# Patient Record
Sex: Male | Born: 1955 | Hispanic: Yes | Marital: Married | Smoking: Former smoker
Health system: Southern US, Community
[De-identification: ages and names within clinical notes are randomized; demographics above are authoritative.]

## PROBLEM LIST (undated history)

## (undated) ENCOUNTER — Telehealth

## (undated) ENCOUNTER — Encounter

## (undated) ENCOUNTER — Encounter: Attending: Nephrology | Primary: Nephrology

## (undated) ENCOUNTER — Ambulatory Visit

## (undated) ENCOUNTER — Ambulatory Visit: Payer: MEDICARE

## (undated) ENCOUNTER — Encounter: Payer: MEDICARE | Attending: Nephrology | Primary: Nephrology

## (undated) ENCOUNTER — Ambulatory Visit: Payer: Medicare (Managed Care) | Attending: Nephrology | Primary: Nephrology

## (undated) ENCOUNTER — Inpatient Hospital Stay

## (undated) ENCOUNTER — Ambulatory Visit: Attending: Family Medicine | Primary: Family Medicine

## (undated) DIAGNOSIS — E119 Type 2 diabetes mellitus without complications: Secondary | ICD-10-CM

## (undated) DIAGNOSIS — Z7982 Long term (current) use of aspirin: Secondary | ICD-10-CM

## (undated) DIAGNOSIS — N186 End stage renal disease: Secondary | ICD-10-CM

## (undated) DIAGNOSIS — M1990 Unspecified osteoarthritis, unspecified site: Secondary | ICD-10-CM

## (undated) DIAGNOSIS — Z79899 Other long term (current) drug therapy: Secondary | ICD-10-CM

## (undated) DIAGNOSIS — Z7960 Long term (current) use of unspecified immunomodulators and immunosuppressants: Secondary | ICD-10-CM

## (undated) DIAGNOSIS — J189 Pneumonia, unspecified organism: Secondary | ICD-10-CM

## (undated) DIAGNOSIS — N400 Enlarged prostate without lower urinary tract symptoms: Secondary | ICD-10-CM

## (undated) DIAGNOSIS — K219 Gastro-esophageal reflux disease without esophagitis: Secondary | ICD-10-CM

## (undated) DIAGNOSIS — G4730 Sleep apnea, unspecified: Secondary | ICD-10-CM

## (undated) DIAGNOSIS — I219 Acute myocardial infarction, unspecified: Secondary | ICD-10-CM

## (undated) DIAGNOSIS — R0600 Dyspnea, unspecified: Secondary | ICD-10-CM

## (undated) DIAGNOSIS — I200 Unstable angina: Secondary | ICD-10-CM

## (undated) DIAGNOSIS — Z9289 Personal history of other medical treatment: Secondary | ICD-10-CM

## (undated) DIAGNOSIS — I2510 Atherosclerotic heart disease of native coronary artery without angina pectoris: Secondary | ICD-10-CM

## (undated) DIAGNOSIS — M51369 Other intervertebral disc degeneration, lumbar region without mention of lumbar back pain or lower extremity pain: Secondary | ICD-10-CM

## (undated) DIAGNOSIS — I480 Paroxysmal atrial fibrillation: Secondary | ICD-10-CM

## (undated) DIAGNOSIS — I10 Essential (primary) hypertension: Secondary | ICD-10-CM

## (undated) DIAGNOSIS — I779 Disorder of arteries and arterioles, unspecified: Secondary | ICD-10-CM

## (undated) DIAGNOSIS — Z7901 Long term (current) use of anticoagulants: Secondary | ICD-10-CM

## (undated) DIAGNOSIS — E559 Vitamin D deficiency, unspecified: Secondary | ICD-10-CM

## (undated) DIAGNOSIS — N189 Chronic kidney disease, unspecified: Secondary | ICD-10-CM

## (undated) DIAGNOSIS — I700 Atherosclerosis of aorta: Secondary | ICD-10-CM

## (undated) HISTORY — PX: HIP SURGERY: SHX245

## (undated) HISTORY — PX: CATARACT EXTRACTION: SUR2

## (undated) HISTORY — PX: EYE SURGERY: SHX253

## (undated) HISTORY — PX: HIP FRACTURE SURGERY: SHX118

## (undated) HISTORY — PX: CORONARY ANGIOPLASTY: SHX604

---
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
## 2001-10-03 ENCOUNTER — Inpatient Hospital Stay (HOSPITAL_COMMUNITY): Admission: AD | Admit: 2001-10-03 | Discharge: 2001-10-11 | Payer: Self-pay | Admitting: Cardiology

## 2001-10-06 ENCOUNTER — Encounter: Payer: Self-pay | Admitting: Cardiology

## 2001-10-09 ENCOUNTER — Encounter: Payer: Self-pay | Admitting: Cardiology

## 2002-07-16 ENCOUNTER — Encounter: Payer: Self-pay | Admitting: Emergency Medicine

## 2002-07-16 ENCOUNTER — Emergency Department (HOSPITAL_COMMUNITY): Admission: EM | Admit: 2002-07-16 | Discharge: 2002-07-16 | Payer: Self-pay | Admitting: Emergency Medicine

## 2002-08-02 DIAGNOSIS — I219 Acute myocardial infarction, unspecified: Secondary | ICD-10-CM

## 2002-08-02 HISTORY — PX: CORONARY ANGIOPLASTY WITH STENT PLACEMENT: SHX49

## 2002-08-02 HISTORY — DX: Acute myocardial infarction, unspecified: I21.9

## 2003-08-03 HISTORY — PX: CORONARY ANGIOPLASTY WITH STENT PLACEMENT: SHX49

## 2005-09-14 ENCOUNTER — Ambulatory Visit: Payer: Self-pay | Admitting: Nurse Practitioner

## 2005-11-18 ENCOUNTER — Ambulatory Visit: Payer: Self-pay | Admitting: Podiatry

## 2006-05-20 ENCOUNTER — Ambulatory Visit: Payer: Self-pay | Admitting: Cardiovascular Disease

## 2006-05-20 HISTORY — PX: LEFT HEART CATH AND CORONARY ANGIOGRAPHY: CATH118249

## 2006-06-08 HISTORY — PX: CORONARY ARTERY BYPASS GRAFT: SHX141

## 2007-08-10 ENCOUNTER — Ambulatory Visit: Payer: Self-pay | Admitting: Cardiovascular Disease

## 2007-08-10 HISTORY — PX: LEFT HEART CATH AND CORS/GRAFTS ANGIOGRAPHY: CATH118250

## 2007-08-25 ENCOUNTER — Inpatient Hospital Stay: Payer: Self-pay | Admitting: Cardiovascular Disease

## 2007-08-25 ENCOUNTER — Other Ambulatory Visit: Payer: Self-pay

## 2007-08-25 HISTORY — PX: CORONARY ANGIOPLASTY WITH STENT PLACEMENT: SHX49

## 2007-11-13 ENCOUNTER — Ambulatory Visit: Payer: Self-pay | Admitting: Family Medicine

## 2008-10-02 ENCOUNTER — Ambulatory Visit: Payer: Self-pay | Admitting: Family Medicine

## 2010-02-24 ENCOUNTER — Ambulatory Visit: Payer: Self-pay | Admitting: Family Medicine

## 2010-04-13 ENCOUNTER — Emergency Department: Payer: Self-pay | Admitting: Emergency Medicine

## 2010-07-13 ENCOUNTER — Ambulatory Visit: Payer: Self-pay | Admitting: Vascular Surgery

## 2010-08-03 ENCOUNTER — Inpatient Hospital Stay: Payer: Self-pay | Admitting: Internal Medicine

## 2010-08-26 ENCOUNTER — Ambulatory Visit (HOSPITAL_COMMUNITY)
Admission: RE | Admit: 2010-08-26 | Discharge: 2010-08-26 | Payer: Self-pay | Source: Home / Self Care | Admitting: Internal Medicine

## 2010-09-01 ENCOUNTER — Other Ambulatory Visit: Payer: Self-pay | Admitting: Internal Medicine

## 2010-09-01 DIAGNOSIS — N186 End stage renal disease: Secondary | ICD-10-CM

## 2010-09-02 ENCOUNTER — Other Ambulatory Visit (HOSPITAL_COMMUNITY): Payer: Self-pay

## 2010-10-19 ENCOUNTER — Emergency Department (HOSPITAL_COMMUNITY): Payer: PRIVATE HEALTH INSURANCE

## 2010-10-19 ENCOUNTER — Inpatient Hospital Stay (HOSPITAL_COMMUNITY)
Admission: EM | Admit: 2010-10-19 | Discharge: 2010-10-23 | DRG: 480 | Disposition: A | Discharge status: Rehabilitation Facility | Payer: PRIVATE HEALTH INSURANCE | Attending: Internal Medicine | Admitting: Internal Medicine

## 2010-10-19 DIAGNOSIS — Z930 Tracheostomy status: Secondary | ICD-10-CM

## 2010-10-19 DIAGNOSIS — Y9210 Unspecified residential institution as the place of occurrence of the external cause: Secondary | ICD-10-CM | POA: Diagnosis present

## 2010-10-19 DIAGNOSIS — J95821 Acute postprocedural respiratory failure: Secondary | ICD-10-CM | POA: Diagnosis not present

## 2010-10-19 DIAGNOSIS — Y998 Other external cause status: Secondary | ICD-10-CM

## 2010-10-19 DIAGNOSIS — S52539A Colles' fracture of unspecified radius, initial encounter for closed fracture: Secondary | ICD-10-CM | POA: Diagnosis present

## 2010-10-19 DIAGNOSIS — D631 Anemia in chronic kidney disease: Secondary | ICD-10-CM | POA: Diagnosis present

## 2010-10-19 DIAGNOSIS — J90 Pleural effusion, not elsewhere classified: Secondary | ICD-10-CM | POA: Diagnosis present

## 2010-10-19 DIAGNOSIS — E46 Unspecified protein-calorie malnutrition: Secondary | ICD-10-CM | POA: Diagnosis present

## 2010-10-19 DIAGNOSIS — E785 Hyperlipidemia, unspecified: Secondary | ICD-10-CM | POA: Diagnosis present

## 2010-10-19 DIAGNOSIS — S72143A Displaced intertrochanteric fracture of unspecified femur, initial encounter for closed fracture: Principal | ICD-10-CM | POA: Diagnosis present

## 2010-10-19 DIAGNOSIS — Z992 Dependence on renal dialysis: Secondary | ICD-10-CM

## 2010-10-19 DIAGNOSIS — E119 Type 2 diabetes mellitus without complications: Secondary | ICD-10-CM | POA: Diagnosis present

## 2010-10-19 DIAGNOSIS — I82729 Chronic embolism and thrombosis of deep veins of unspecified upper extremity: Secondary | ICD-10-CM | POA: Diagnosis present

## 2010-10-19 DIAGNOSIS — D649 Anemia, unspecified: Secondary | ICD-10-CM | POA: Diagnosis present

## 2010-10-19 DIAGNOSIS — W06XXXA Fall from bed, initial encounter: Secondary | ICD-10-CM | POA: Diagnosis present

## 2010-10-19 DIAGNOSIS — I120 Hypertensive chronic kidney disease with stage 5 chronic kidney disease or end stage renal disease: Secondary | ICD-10-CM | POA: Diagnosis present

## 2010-10-19 DIAGNOSIS — N186 End stage renal disease: Secondary | ICD-10-CM | POA: Diagnosis present

## 2010-10-19 DIAGNOSIS — I2510 Atherosclerotic heart disease of native coronary artery without angina pectoris: Secondary | ICD-10-CM | POA: Diagnosis present

## 2010-10-19 LAB — POCT I-STAT 3, ART BLOOD GAS (G3+)
pCO2 arterial: 36.3 mmHg (ref 35.0–45.0)
pO2, Arterial: 72 mmHg — ABNORMAL LOW (ref 80.0–100.0)

## 2010-10-19 LAB — BASIC METABOLIC PANEL
CO2: 27 mEq/L (ref 19–32)
Chloride: 97 mEq/L (ref 96–112)
GFR calc Af Amer: 19 mL/min — ABNORMAL LOW (ref >60)
Potassium: 4.4 mEq/L (ref 3.5–5.1)
Sodium: 132 mEq/L — ABNORMAL LOW (ref 135–145)

## 2010-10-19 LAB — DIFFERENTIAL
Eosinophils Absolute: 0.1 10*3/uL (ref 0.0–0.7)
Lymphs Abs: 1.8 10*3/uL (ref 0.7–4.0)
Monocytes Relative: 12 % (ref 3–12)
Neutrophils Relative %: 73 % (ref 43–77)

## 2010-10-19 LAB — CROSSMATCH: Antibody Screen: NEGATIVE

## 2010-10-19 LAB — URINE MICROSCOPIC-ADD ON

## 2010-10-19 LAB — URINALYSIS, ROUTINE W REFLEX MICROSCOPIC
Bilirubin Urine: NEGATIVE
Nitrite: NEGATIVE
Specific Gravity, Urine: 1.015 (ref 1.005–1.030)
Urobilinogen, UA: 0.2 mg/dL (ref 0.0–1.0)

## 2010-10-19 LAB — CBC
MCH: 27.2 pg (ref 26.0–34.0)
MCV: 86.5 fL (ref 78.0–100.0)
Platelets: 471 10*3/uL — ABNORMAL HIGH (ref 150–400)
RBC: 3.86 MIL/uL — ABNORMAL LOW (ref 4.22–5.81)

## 2010-10-19 LAB — ABO/RH: ABO/RH(D): A POS

## 2010-10-20 ENCOUNTER — Inpatient Hospital Stay (HOSPITAL_COMMUNITY): Payer: PRIVATE HEALTH INSURANCE

## 2010-10-20 DIAGNOSIS — S72009A Fracture of unspecified part of neck of unspecified femur, initial encounter for closed fracture: Secondary | ICD-10-CM

## 2010-10-20 DIAGNOSIS — W19XXXA Unspecified fall, initial encounter: Secondary | ICD-10-CM

## 2010-10-20 DIAGNOSIS — J9600 Acute respiratory failure, unspecified whether with hypoxia or hypercapnia: Secondary | ICD-10-CM

## 2010-10-20 LAB — PHOSPHORUS: Phosphorus: 4.2 mg/dL (ref 2.3–4.6)

## 2010-10-20 LAB — GLUCOSE, CAPILLARY
Glucose-Capillary: 141 mg/dL — ABNORMAL HIGH (ref 70–99)
Glucose-Capillary: 127 mg/dL — ABNORMAL HIGH (ref 70–99)
Glucose-Capillary: 127 mg/dL — ABNORMAL HIGH (ref 70–99)

## 2010-10-20 LAB — PREPARE FRESH FROZEN PLASMA

## 2010-10-20 LAB — POCT I-STAT 3, ART BLOOD GAS (G3+)
Acid-Base Excess: 4 mmol/L — ABNORMAL HIGH (ref 0.0–2.0)
Patient temperature: 98.9
TCO2: 29 mmol/L (ref 0–100)
pH, Arterial: 7.451 — ABNORMAL HIGH (ref 7.350–7.450)

## 2010-10-20 LAB — BASIC METABOLIC PANEL
CO2: 27 mEq/L (ref 19–32)
Calcium: 7.7 mg/dL — ABNORMAL LOW (ref 8.4–10.5)
Creatinine, Ser: 3.83 mg/dL — ABNORMAL HIGH (ref 0.4–1.5)
GFR calc non Af Amer: 16 mL/min — ABNORMAL LOW (ref >60)
Glucose, Bld: 149 mg/dL — ABNORMAL HIGH (ref 70–99)
Sodium: 136 mEq/L (ref 135–145)

## 2010-10-20 LAB — HEPATITIS B SURFACE ANTIGEN: Hepatitis B Surface Ag: NEGATIVE

## 2010-10-20 LAB — CBC
HCT: 25 % — ABNORMAL LOW (ref 39.0–52.0)
MCH: 27.5 pg (ref 26.0–34.0)
MCHC: 32 g/dL (ref 30.0–36.0)
RDW: 15.8 % — ABNORMAL HIGH (ref 11.5–15.5)

## 2010-10-20 LAB — MRSA PCR SCREENING: MRSA by PCR: POSITIVE — AB

## 2010-10-20 LAB — URINE CULTURE: Colony Count: >=100000

## 2010-10-21 ENCOUNTER — Inpatient Hospital Stay (HOSPITAL_COMMUNITY): Payer: PRIVATE HEALTH INSURANCE

## 2010-10-21 DIAGNOSIS — S72143A Displaced intertrochanteric fracture of unspecified femur, initial encounter for closed fracture: Secondary | ICD-10-CM

## 2010-10-21 DIAGNOSIS — S52309A Unspecified fracture of shaft of unspecified radius, initial encounter for closed fracture: Secondary | ICD-10-CM

## 2010-10-21 LAB — GLUCOSE, CAPILLARY
Glucose-Capillary: 121 mg/dL — ABNORMAL HIGH (ref 70–99)
Glucose-Capillary: 140 mg/dL — ABNORMAL HIGH (ref 70–99)
Glucose-Capillary: 256 mg/dL — ABNORMAL HIGH (ref 70–99)

## 2010-10-21 LAB — COMPREHENSIVE METABOLIC PANEL
AST: 19 U/L (ref 0–37)
Albumin: 1.8 g/dL — ABNORMAL LOW (ref 3.5–5.2)
BUN: 13 mg/dL (ref 6–23)
Calcium: 7.9 mg/dL — ABNORMAL LOW (ref 8.4–10.5)
Creatinine, Ser: 2.26 mg/dL — ABNORMAL HIGH (ref 0.4–1.5)
GFR calc Af Amer: 37 mL/min — ABNORMAL LOW (ref >60)
Total Protein: 6.6 g/dL (ref 6.0–8.3)

## 2010-10-21 LAB — CBC
MCV: 86.4 fL (ref 78.0–100.0)
MCV: 86.9 fL (ref 78.0–100.0)
Platelets: 405 10*3/uL — ABNORMAL HIGH (ref 150–400)
Platelets: 454 10*3/uL — ABNORMAL HIGH (ref 150–400)
RBC: 3.16 MIL/uL — ABNORMAL LOW (ref 4.22–5.81)
RBC: 3.12 MIL/uL — ABNORMAL LOW (ref 4.22–5.81)
WBC: 13.2 10*3/uL — ABNORMAL HIGH (ref 4.0–10.5)
WBC: 12.2 10*3/uL — ABNORMAL HIGH (ref 4.0–10.5)

## 2010-10-21 LAB — PROTIME-INR
INR: 1.82 — ABNORMAL HIGH (ref 0.00–1.49)
Prothrombin Time: 21.2 seconds — ABNORMAL HIGH (ref 11.6–15.2)

## 2010-10-21 LAB — PTH, INTACT AND CALCIUM
Calcium, Total (PTH): 8 mg/dL — ABNORMAL LOW (ref 8.4–10.5)
PTH: 59.5 pg/mL (ref 14.0–72.0)

## 2010-10-21 LAB — APTT: aPTT: 51 seconds — ABNORMAL HIGH (ref 24–37)

## 2010-10-21 NOTE — Consult Note (Signed)
  NAMEROZELL, BEANER NO.:  [id number]  MEDICAL RECORD NO.:  [id number]           PATIENT TYPE:  I  LOCATION:  2104                         FACILITY:  [facility]  PHYSICIAN:  Pietro Cassis. Alvan Dame, M.D.  DATE OF BIRTH:  [birth year]  DATE OF CONSULTATION:  10/19/2010 DATE OF DISCHARGE:                                CONSULTATION   CHIEF COMPLAINT:  Left intertrochanteric femur fracture.  [name] is a 55 year old gentleman who was transferred from [facility] here in [place] after a fall.  The patient had been hospitalized for greater than a month at that facility with plans for his discharge to home apparently tomorrow.  Unfortunately, he had a fall and was transferred to [facility] revealed a left wrist fracture as well as the left hip fracture.  Orthopedics and Hand was consulted and he was admitted to the Medical Service.  PAST MEDICAL HISTORY:  Quite significant for anemia, coronary artery disease, deep vein thrombosis in the past on Coumadin, diabetes, end- stage renal disease, hyperlipidemia, hypertension, osteomyelitis of the spine, apparently pneumonia.  He has a history of PEG placement as well as tracheostomy.  SOCIAL HISTORY:  Apparently not a drug user, drinker, or smoker.  He does have family with him.  By time of my evaluation, he was already intubated in the OR.  Radiographs revealed an intertrochanteric femur fracture of left hip.  CURRENT MEDICATIONS:  Tylenol, aspirin, bisacodyl, citalopram, clonazepam, clonidine, dextrose, erythromycin, fentanyl patch, insulin, lactulose, metoprolol, morphine as needed, nitroglycerin, Zofran, pantoprazole, and MiraLax.  DRUG ALLERGIES:  No known drug allergies.  ASSESSMENT: 1. Left intertrochanteric femur fracture. 2. Left wrist fracture.  PLAN:  The patient's current medical and orthopedic conditions were reviewed in consultation over the phone.  The plan was to proceed to  OR to fix his left wrist.  Dr. Burney Gauze and nurses were able to obtain consent for open reduction and internal fixation of his left hip.  The patient was in the operating room at the time of my evaluation.  The appropriate side has been signed and a separate time-out was performed by the time I presented to the operating room.  The patient will have an open reduction and internal fixation of left hip.  He will then be partial weightbearing and will be treated medically.  We will follow him in the hospital and then in the postoperative course.     Pietro Cassis Alvan Dame, M.D.     MDO/MEDQ  D:  10/19/2010  T:  10/20/2010  Job:  [id number]  Electronically Signed by Paralee Cancel M.D. on 10/21/2010 07:21:52 PM

## 2010-10-21 NOTE — Op Note (Signed)
[name], VLACH NO.:  [id number]  MEDICAL RECORD NO.:  [id number]           PATIENT TYPE:  I  LOCATION:  2104                         FACILITY:  [facility]  PHYSICIAN:  Pietro Cassis. Alvan Dame, M.D.  DATE OF BIRTH:  1956/03/13  DATE OF PROCEDURE:  10/19/2010 DATE OF DISCHARGE:                              OPERATIVE REPORT   PREOPERATIVE DIAGNOSIS:  Left intertrochanteric femur fracture.  POSTOPERATIVE DIAGNOSIS:  Left intertrochanteric femur fracture.  PROCEDURE:  Open reduction and internal fixation of the left intertrochanteric femur fracture utilizing a DePuy troch nail 11 x 180 mm with a lag screw in the distal interlock.  SURGEON:  Pietro Cassis. Alvan Dame, MD.  ASSISTANT:  Surgical team.  ANESTHESIA:  General as the patient was already extubated.  This is a two-part procedure.  The left wrist was fixed by Dr. Charlotte Crumb.  SPECIMENS:  None.  COMPLICATIONS:  None.  DRAINS:  None.  INDICATIONS FOR PROCEDURE:  Mr. Nelli is a 55 year old gentleman with an extensive medical history with a recent hospitalization stay at [facility].  He was apparently getting ready to be discharged after a prolonged stay, requiring tracheostomy and PEG tube placement. He had a fall at that facility, landing on his left hip and wrist.  He was transferred to the [facility] revealed an intertrochanteric femur fracture as well as a wrist fracture. Orthopedics and Hand were consulted for management purposes.  After being admitted to the Medical Service, we were consulted for management.  After reviewing with Dr. Burney Gauze, the consulting Hand Surgeon, the plan was to do this at the same time and get it taken care of.  The patient was seen and evaluated radiographically, determined plan.  His consent was obtained with the plan with family.  He did have an elevated INR, received 2 units of FFP in the operating room prior to me addressing his left  hip.  PROCEDURE IN DETAIL:  The patient was already in the operating room on the fracture table.  He had had his wrist fixed.  He was subsequently positioned carefully with bony prominences and padded on the fracture table with the left foot in traction boot.  The right leg was flexed and abducted out of the way.  Traction and internal rotation was applied and the fracture reduced into a near anatomic position.  At this point, a time-out was performed, identifying the entrance of myself into the procedure as well as the planned procedure, and extremity.  The left lower extremity was then prepped and draped in sterile fashion using a shower curtain technique.  Landmarks were identified and an incision made at proximal trochanter.  Sharp dissection was carried through into the gluteal fascia.  The guidewire was then inserted into the tip of the trochanter and into the proximal femur.  The proximal femur was then opened with a drill and then a 11 x 180 mm nail passed by hand on the jig.  With the nail in its appropriate depth within the femur, the lag screw guide was then placed into the jig and then through an incision laterally onto the lateral cortex of  the femur.  Guidewire was then passed in the center of the head in AP and lateral planes.  At this point, I measured the depth, chose a 115-mm lag screw, drilled, and then placed the screw.  Once the screw was at its appropriate depth, I did release traction and applied some compression using the compression wheel.  There was some medialization of the shaft of the fracture segment.  At this point, a distal interlock was placed.  It did not penetrate the whole cortices and there was found to have excellent purchase within the medial cortex.  I felt this was adequate 36-mm screw and did not exchange it.  At this point, the jig was removed.  I did tighten down the screw in the proximal aspect of screw and backed it off one  quarter, return to allow for some further compression with weightbearing.  The jig was removed.  The wound was irrigated.  The proximal wound was closed in layers of #1 Vicryl and then 2-0 Vicryl.  The remaining wounds were closed with 2-0 Vicryl and staples on the skin.  The skin was cleaned, dried, and dressed sterilely using Mepilex dressing.  He was then transferred, remained intubated, per the plan with the medicine folks to the intensive care unit for observation overnight based on his medical condition.     Pietro Cassis Alvan Dame, M.D.     MDO/MEDQ  D:  10/19/2010  T:  10/20/2010  Job:  [id number]  Electronically Signed by Paralee Cancel M.D. on 10/21/2010 07:21:47 PM

## 2010-10-22 ENCOUNTER — Inpatient Hospital Stay (HOSPITAL_COMMUNITY): Payer: PRIVATE HEALTH INSURANCE

## 2010-10-22 DIAGNOSIS — J90 Pleural effusion, not elsewhere classified: Secondary | ICD-10-CM

## 2010-10-22 LAB — URINE MICROSCOPIC-ADD ON

## 2010-10-22 LAB — IRON AND TIBC: Saturation Ratios: 15 % — ABNORMAL LOW (ref 20–55)

## 2010-10-22 LAB — CBC
Hemoglobin: 9 g/dL — ABNORMAL LOW (ref 13.0–17.0)
MCHC: 31.9 g/dL (ref 30.0–36.0)
Platelets: 496 10*3/uL — ABNORMAL HIGH (ref 150–400)

## 2010-10-22 LAB — RENAL FUNCTION PANEL
Albumin: 1.7 g/dL — ABNORMAL LOW (ref 3.5–5.2)
CO2: 24 mEq/L (ref 19–32)
Calcium: 7.7 mg/dL — ABNORMAL LOW (ref 8.4–10.5)
GFR calc Af Amer: 21 mL/min — ABNORMAL LOW (ref >60)
GFR calc non Af Amer: 18 mL/min — ABNORMAL LOW (ref >60)
Phosphorus: 5.2 mg/dL — ABNORMAL HIGH (ref 2.3–4.6)
Sodium: 134 mEq/L — ABNORMAL LOW (ref 135–145)

## 2010-10-22 LAB — GLUCOSE, CAPILLARY
Glucose-Capillary: 185 mg/dL — ABNORMAL HIGH (ref 70–99)
Glucose-Capillary: 164 mg/dL — ABNORMAL HIGH (ref 70–99)
Glucose-Capillary: 237 mg/dL — ABNORMAL HIGH (ref 70–99)

## 2010-10-22 LAB — URINALYSIS, ROUTINE W REFLEX MICROSCOPIC
Glucose, UA: 100 mg/dL — AB
Specific Gravity, Urine: 1.021 (ref 1.005–1.030)
pH: 5.5 (ref 5.0–8.0)

## 2010-10-22 LAB — PROTIME-INR
INR: 2.31 — ABNORMAL HIGH (ref 0.00–1.49)
Prothrombin Time: 25.5 seconds — ABNORMAL HIGH (ref 11.6–15.2)

## 2010-10-23 ENCOUNTER — Inpatient Hospital Stay (HOSPITAL_COMMUNITY)
Admission: AD | Admit: 2010-10-23 | Discharge: 2010-11-05 | DRG: 945 | Disposition: A | Discharge status: Other Acute Inpatient Hospital | Payer: PRIVATE HEALTH INSURANCE | Source: Ambulatory Visit | Attending: Physical Medicine & Rehabilitation | Admitting: Physical Medicine & Rehabilitation

## 2010-10-23 ENCOUNTER — Inpatient Hospital Stay (HOSPITAL_COMMUNITY): Payer: PRIVATE HEALTH INSURANCE

## 2010-10-23 DIAGNOSIS — Z5189 Encounter for other specified aftercare: Principal | ICD-10-CM

## 2010-10-23 DIAGNOSIS — M519 Unspecified thoracic, thoracolumbar and lumbosacral intervertebral disc disorder: Secondary | ICD-10-CM | POA: Diagnosis present

## 2010-10-23 DIAGNOSIS — Z4789 Encounter for other orthopedic aftercare: Secondary | ICD-10-CM

## 2010-10-23 DIAGNOSIS — S52609A Unspecified fracture of lower end of unspecified ulna, initial encounter for closed fracture: Secondary | ICD-10-CM

## 2010-10-23 DIAGNOSIS — S72009A Fracture of unspecified part of neck of unspecified femur, initial encounter for closed fracture: Secondary | ICD-10-CM

## 2010-10-23 DIAGNOSIS — N186 End stage renal disease: Secondary | ICD-10-CM

## 2010-10-23 DIAGNOSIS — S52539A Colles' fracture of unspecified radius, initial encounter for closed fracture: Secondary | ICD-10-CM | POA: Diagnosis present

## 2010-10-23 DIAGNOSIS — Z992 Dependence on renal dialysis: Secondary | ICD-10-CM

## 2010-10-23 DIAGNOSIS — I82629 Acute embolism and thrombosis of deep veins of unspecified upper extremity: Secondary | ICD-10-CM | POA: Diagnosis present

## 2010-10-23 DIAGNOSIS — J90 Pleural effusion, not elsewhere classified: Secondary | ICD-10-CM | POA: Diagnosis not present

## 2010-10-23 DIAGNOSIS — E119 Type 2 diabetes mellitus without complications: Secondary | ICD-10-CM | POA: Diagnosis present

## 2010-10-23 DIAGNOSIS — S52509A Unspecified fracture of the lower end of unspecified radius, initial encounter for closed fracture: Secondary | ICD-10-CM

## 2010-10-23 DIAGNOSIS — M869 Osteomyelitis, unspecified: Secondary | ICD-10-CM | POA: Diagnosis present

## 2010-10-23 DIAGNOSIS — I2510 Atherosclerotic heart disease of native coronary artery without angina pectoris: Secondary | ICD-10-CM | POA: Diagnosis present

## 2010-10-23 DIAGNOSIS — S72143A Displaced intertrochanteric fracture of unspecified femur, initial encounter for closed fracture: Secondary | ICD-10-CM | POA: Diagnosis present

## 2010-10-23 DIAGNOSIS — Z951 Presence of aortocoronary bypass graft: Secondary | ICD-10-CM

## 2010-10-23 LAB — GLUCOSE, CAPILLARY
Glucose-Capillary: 168 mg/dL — ABNORMAL HIGH (ref 70–99)
Glucose-Capillary: 188 mg/dL — ABNORMAL HIGH (ref 70–99)
Glucose-Capillary: 326 mg/dL — ABNORMAL HIGH (ref 70–99)

## 2010-10-23 LAB — BASIC METABOLIC PANEL
BUN: 22 mg/dL (ref 6–23)
CO2: 25 mEq/L (ref 19–32)
Calcium: 8 mg/dL — ABNORMAL LOW (ref 8.4–10.5)
Chloride: 96 mEq/L (ref 96–112)
Creatinine, Ser: 2.76 mg/dL — ABNORMAL HIGH (ref 0.4–1.5)
GFR calc Af Amer: 29 mL/min — ABNORMAL LOW (ref >60)

## 2010-10-23 LAB — CBC
HCT: 29.8 % — ABNORMAL LOW (ref 39.0–52.0)
Hemoglobin: 9.4 g/dL — ABNORMAL LOW (ref 13.0–17.0)
RDW: 15.5 % (ref 11.5–15.5)
WBC: 16.4 10*3/uL — ABNORMAL HIGH (ref 4.0–10.5)

## 2010-10-23 LAB — DIFFERENTIAL
Basophils Absolute: 0 10*3/uL (ref 0.0–0.1)
Basophils Relative: 0 % (ref 0–1)
Lymphocytes Relative: 9 % — ABNORMAL LOW (ref 12–46)
Neutro Abs: 12.8 10*3/uL — ABNORMAL HIGH (ref 1.7–7.7)

## 2010-10-23 LAB — PROTIME-INR
INR: 2.51 — ABNORMAL HIGH (ref 0.00–1.49)
Prothrombin Time: 27.2 seconds — ABNORMAL HIGH (ref 11.6–15.2)

## 2010-10-23 LAB — PHOSPHORUS: Phosphorus: 4.1 mg/dL (ref 2.3–4.6)

## 2010-10-24 ENCOUNTER — Inpatient Hospital Stay (HOSPITAL_COMMUNITY): Payer: PRIVATE HEALTH INSURANCE

## 2010-10-24 DIAGNOSIS — M519 Unspecified thoracic, thoracolumbar and lumbosacral intervertebral disc disorder: Secondary | ICD-10-CM

## 2010-10-24 DIAGNOSIS — S52609A Unspecified fracture of lower end of unspecified ulna, initial encounter for closed fracture: Secondary | ICD-10-CM

## 2010-10-24 DIAGNOSIS — S72009A Fracture of unspecified part of neck of unspecified femur, initial encounter for closed fracture: Secondary | ICD-10-CM

## 2010-10-24 DIAGNOSIS — S52509A Unspecified fracture of the lower end of unspecified radius, initial encounter for closed fracture: Secondary | ICD-10-CM

## 2010-10-24 DIAGNOSIS — J869 Pyothorax without fistula: Secondary | ICD-10-CM

## 2010-10-24 DIAGNOSIS — N186 End stage renal disease: Secondary | ICD-10-CM

## 2010-10-24 LAB — GLUCOSE, CAPILLARY
Glucose-Capillary: 234 mg/dL — ABNORMAL HIGH (ref 70–99)
Glucose-Capillary: 298 mg/dL — ABNORMAL HIGH (ref 70–99)
Glucose-Capillary: 186 mg/dL — ABNORMAL HIGH (ref 70–99)

## 2010-10-24 LAB — PROTIME-INR: INR: 2.25 — ABNORMAL HIGH (ref 0.00–1.49)

## 2010-10-25 ENCOUNTER — Inpatient Hospital Stay (HOSPITAL_COMMUNITY): Payer: PRIVATE HEALTH INSURANCE

## 2010-10-25 LAB — DIFFERENTIAL
Eosinophils Absolute: 0.2 10*3/uL (ref 0.0–0.7)
Eosinophils Relative: 2 % (ref 0–5)
Lymphocytes Relative: 15 % (ref 12–46)
Lymphs Abs: 2 10*3/uL (ref 0.7–4.0)
Monocytes Absolute: 1.4 10*3/uL — ABNORMAL HIGH (ref 0.1–1.0)

## 2010-10-25 LAB — URINALYSIS, ROUTINE W REFLEX MICROSCOPIC
Glucose, UA: 250 mg/dL — AB
Protein, ur: >300 mg/dL — AB
Specific Gravity, Urine: 1.025 (ref 1.005–1.030)
pH: 5 (ref 5.0–8.0)

## 2010-10-25 LAB — BASIC METABOLIC PANEL
BUN: 19 mg/dL (ref 6–23)
Calcium: 8 mg/dL — ABNORMAL LOW (ref 8.4–10.5)
Creatinine, Ser: 3.28 mg/dL — ABNORMAL HIGH (ref 0.4–1.5)
GFR calc non Af Amer: 20 mL/min — ABNORMAL LOW (ref >60)
Glucose, Bld: 284 mg/dL — ABNORMAL HIGH (ref 70–99)

## 2010-10-25 LAB — CBC
HCT: 32.7 % — ABNORMAL LOW (ref 39.0–52.0)
MCHC: 31.8 g/dL (ref 30.0–36.0)
MCV: 85.8 fL (ref 78.0–100.0)
Platelets: 444 10*3/uL — ABNORMAL HIGH (ref 150–400)
RDW: 15.9 % — ABNORMAL HIGH (ref 11.5–15.5)

## 2010-10-25 LAB — URINE MICROSCOPIC-ADD ON

## 2010-10-25 LAB — PROTIME-INR: Prothrombin Time: 23.4 seconds — ABNORMAL HIGH (ref 11.6–15.2)

## 2010-10-25 LAB — GLUCOSE, CAPILLARY: Glucose-Capillary: 273 mg/dL — ABNORMAL HIGH (ref 70–99)

## 2010-10-26 DIAGNOSIS — N186 End stage renal disease: Secondary | ICD-10-CM

## 2010-10-26 DIAGNOSIS — M519 Unspecified thoracic, thoracolumbar and lumbosacral intervertebral disc disorder: Secondary | ICD-10-CM

## 2010-10-26 DIAGNOSIS — S72009A Fracture of unspecified part of neck of unspecified femur, initial encounter for closed fracture: Secondary | ICD-10-CM

## 2010-10-26 DIAGNOSIS — S52609A Unspecified fracture of lower end of unspecified ulna, initial encounter for closed fracture: Secondary | ICD-10-CM

## 2010-10-26 DIAGNOSIS — S52509A Unspecified fracture of the lower end of unspecified radius, initial encounter for closed fracture: Secondary | ICD-10-CM

## 2010-10-26 LAB — GLUCOSE, CAPILLARY
Glucose-Capillary: 378 mg/dL — ABNORMAL HIGH (ref 70–99)
Glucose-Capillary: 276 mg/dL — ABNORMAL HIGH (ref 70–99)
Glucose-Capillary: 205 mg/dL — ABNORMAL HIGH (ref 70–99)

## 2010-10-26 LAB — URINE CULTURE: Culture  Setup Time: 201203251729

## 2010-10-26 NOTE — H&P (Signed)
NAMECLENNON, MACZKO            ACCOUNT NO.:  [id number]  MEDICAL RECORD NO.:  [id number]           PATIENT TYPE:  I  LOCATION:  [id number]                         FACILITY:  [facility]  PHYSICIAN:  Meredith Staggers, M.D.DATE OF BIRTH:  April 12, 1956  DATE OF ADMISSION:  10/23/2010 DATE OF DISCHARGE:                             HISTORY & PHYSICAL   CHIEF COMPLAINT:  Leg pain and arm pain on the left side.  SURGEON:  Pietro Cassis. Alvan Dame, MD and Sheral Apley. Weingold, MD  HISTORY OF PRESENT ILLNESS:  This is a 55 year old Hispanic male with lumbar osteomyelitis, complicated by pleural effusion, bilateral pneumonia VDRL.  He was discharged from [facility] and progressed well, has planned to go home on October 19, 2010, where he fell sustained a left radius fracture and displacement as well as left intertrochanteric hip fracture on the same day.  He was transferred to [facility] for further care.  He underwent ORIF of the left femur fracture and left distal radius fracture by Dr. Alvan Dame and Dr. Burney Gauze respectively.  He is partial weightbearing left lower extremity and nonweightbearing to the left wrist.  CT of the chest showed large complicated pleural effusion on the right likely due to hemothorax and left pleural effusion with drainage catheter in place.  Dr. Prescott Gum was consulted who recommended following chronic right loculated effusion with potential VATS in the future, MBS was done today by speech and the patient was cleared for regular diet.  He is anxious to start that. Therapies were initiated.  The patient needs cues for safety and weightbearing precautions.  I saw the patient on October 21, 2010, and felt he could benefit from an inpatient stay.  REVIEW OF SYSTEMS:  Notable for weakness, low-back pain, and wound care issues.  Full 12-point review is in the written health and history section of the chart.  PAST MEDICAL HISTORY:  Positive for, 1. End-stage renal disease on  hemodialysis Monday, Wednesday, and     Friday. 2. Diabetes type 2. 3. CAD with CABG and PTCA in 2003. 4. CHF. 5. Bilateral pleural effusion with left PleurX catheter. 6. Right upper extremity DVT. 7. Osteo by last lumbar spine with staph. 8. Anemia of chronic disease. 9. Depression. 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 11.History of diskitis as well secondary to above.  FAMILY HISTORY:  Positive for CAD.  SOCIAL HISTORY:  The patient is married, independent prior to 3 months ago.  He lives in a one-level house with 4 steps to enter.  He quit smoking 20 years ago.  Does not drink.  He is disabled secondary to his cardiac disease.  Wife works, but has multiple children and family in town who can help him.  ALLERGIES:  None.  HOME MEDICATIONS:  Please see written H and P, labs, please see written H and P as well with recent white count 16.4 as of today up from 12.6.  PHYSICAL EXAMINATION:  VITAL SIGNS:  Blood pressure 157/70, pulse 97, temperature 98.1, respiratory rate 16. GENERAL:  The patient is pleasant, alert, oriented x3. HEENT:  Pupils equally round and reactive to light.  He has ecchymoses around  the left eye, which is improved from 2 days ago.  Ear, nose, and throat exam is notable for borderline dentition.  Pink moist mucosa. NECK:  Supple without JVD or lymphadenopathy. CHEST:  Notable for decreased sounds at the bases.  Left PleurX catheter is noted. HEART:  Regular rate and rhythm without murmurs, rubs, or gallops. ABDOMEN:  Soft, nontender.  Bowel sounds are positive.  He has a PEG site in place in the abdomen, which is clean and intact. SKIN:  Generally notable for few bruises particularly on the left knee and bilateral shins.  These are healing nicely. EXTREMITIES:  He has a left forearm splint in place, which seems to be fitting appropriately.  Left hand is neurovascularly intact, although he does lack a little bit of extension of fingers.  Left femur  incision is clean, dry, and intact with staples and covered with Mepilex was generally 4-5/5 right upper extremity and right and lower extremity today.  On the left lower extremity he is unable to lift leg off the bed though he could move his toes with 4/5 strength.  Ankle dorsiflexion, plantar flexion.  Left upper extremity and shoulder biceps, triceps were all 4/5.  Hips cannot be tested.  Hand intrinsics were grossly 3/5. NEUROLOGIC:  Judgment, orientation, memory, and mood seemed to be all appropriate.  POST ADMISSION PHYSICIAN EVALUATION: 1. Functional deficit secondary to left femur fracture and left distal     radius fracture after prolonged hospital course related to     osteomyelitis and ventilator-dependent respiratory failure. 2. The patient is admitted to receive collaborative interdisciplinary     care between the physiatrist, rehab nursing staff, and therapy     team. 3. The patient's level of medical complexity and substantial therapy     needs in context of that medical necessity cannot be provided at a     lesser intensity of care. 4. The patient has experienced substantial functional loss from his     baseline.  Premorbidly, the patient was independent for mobility,     most recently he has been total assist 60% for bed mobility, mod     assist for transfers, total assist 50%, 7-feet rolling walker, max     to total assist lower body care, mod assist upper body care.  He is     able to feed self independently.  Judging by the patient's     diagnosis, physical exam, and functional history, he has potential     for functional progress, which will result in measurable gains     while in inpatient rehab.  These gains will be of substantial and     practical use upon discharge to home in facilitating mobility and     self-care. 5. Physiatrist will provide 24-hour management of medical needs as     well as oversight of this therapy plan/treatment and provide     guidance  as appropriate regarding interaction of the two.  Medical     problem list and plan are below number. 6. A 24-hour rehab nursing team will assist in the management of the     patient's skin care needs as well as bowel and bladder function,     safety awareness, integration of therapy concept, and techniques. 7. PT will assess and treat for lower extremity strength, range of     motion, functional mobility, safety, gait, goals supervision to     modified independent. 8. OT will assess and treat for upper extremity use  ADLs, adaptive     techniques, equipment, functional mobility, adaptive techniques,     and equipment goals, modified independent to min assist both with     PT and OT will need to work on awareness of safety and     weightbearing precautions. 9. Speech and language pathology will follow for any residual     swallowing and cognitive deficits, although he seems to be     improving nicely there. 10.Case management and social worker will assess and treat for     psychosocial issues and discharge planning. 11.Team conference will be held weekly to assess progress towards     goals and to determine barriers at discharge. 12.The patient demonstrated sufficient medical stability and exercise     capacity to tolerate at least 3 hours of therapy per day at least 5     days per week. 13.Estimated length of stay is 2-3 weeks.  Prognosis is good.  MEDICAL PROBLEM LIST AND PLAN: 1. DVT prophylaxis/anticoagulation with Coumadin per pharmacy.  Follow     CBCs and regular INRs.  No active signs of bleeding at present. 2. Pain management with scheduled fentanyl patch and p.r.n. oxycodone.     He seems to be under reasonable control.  Pain was a 4/10 on eval     today. 3. Mood:  Celexa at bedtime and provide ego supportive therapy.  The     patient seems to be improving here as he is improving again     medically. 4. End-stage renal disease:  Continue hemodialysis Tuesday, Thursday,      Saturday per Nephrology schedule.  Transition to Monday, Wednesday,     and Friday per his home regimen. 5. Type 2 diabetes:  Resume Lantus for better coverage.  Check CBGs     before meals and nightly and cover sliding-scale insulin. 6. CAD:  Lopressor will continue for rate control and cardiac     prophylaxis.  We will change to p.o. dose. 7. Lumbar diskitis and osteo.  Continue IV vancomycin.  Contact     precautions as well.  We will contact Infectious Disease regarding     duration and further recommendations for treatment. 8. Right upper extremity DVT:  See above.     Meredith Staggers, M.D.     ZTS/MEDQ  D:  10/23/2010  T:  10/24/2010  Job:  [id number]  cc:   Pietro Cassis Alvan Dame, M.D. Sheral Apley Burney Gauze, M.D.  Electronically Signed by Alger Simons M.D. on 10/26/2010 10:13:26 AM

## 2010-10-27 ENCOUNTER — Inpatient Hospital Stay (HOSPITAL_COMMUNITY): Payer: PRIVATE HEALTH INSURANCE

## 2010-10-27 DIAGNOSIS — J90 Pleural effusion, not elsewhere classified: Secondary | ICD-10-CM

## 2010-10-27 LAB — GLUCOSE, CAPILLARY
Glucose-Capillary: 190 mg/dL — ABNORMAL HIGH (ref 70–99)
Glucose-Capillary: 156 mg/dL — ABNORMAL HIGH (ref 70–99)

## 2010-10-27 LAB — BASIC METABOLIC PANEL
CO2: 24 mEq/L (ref 19–32)
Calcium: 7.7 mg/dL — ABNORMAL LOW (ref 8.4–10.5)
Chloride: 88 mEq/L — ABNORMAL LOW (ref 96–112)
Creatinine, Ser: 5.35 mg/dL — ABNORMAL HIGH (ref 0.4–1.5)
Glucose, Bld: 153 mg/dL — ABNORMAL HIGH (ref 70–99)

## 2010-10-27 LAB — CULTURE, BLOOD (ROUTINE X 2): Culture: NO GROWTH

## 2010-10-27 LAB — PROTIME-INR
INR: 1.88 — ABNORMAL HIGH (ref 0.00–1.49)
Prothrombin Time: 21.8 seconds — ABNORMAL HIGH (ref 11.6–15.2)

## 2010-10-27 NOTE — Discharge Summary (Signed)
NAMEKESTON, [name]            ACCOUNT NO.:  [id number]  MEDICAL RECORD NO.:  [id number]           PATIENT TYPE:  I  LOCATION:  [id number]                         FACILITY:  [facility]  PHYSICIAN:  Eleonore Chiquito, MD         DATE OF BIRTH:  [birth year]  DATE OF ADMISSION:  10/19/2010 DATE OF DISCHARGE:  10/23/2010                              DISCHARGE SUMMARY   ADMISSION DIAGNOSES: 1. Left intertrochanteric femur fracture. 2. History of deep vein thrombosis. 3. Displaced intra-articular fracture of the distal radius, left side. 4. History of coronary artery disease. 5. History of coronary artery bypass graft. 6. Hypertension. 7. Diabetes mellitus. 8. Anemia of chronic disease. 9. History of pneumonia. 10.History of osteomyelitis of the lumbar spine. 11.Calorie malnutrition. 12.Physical deconditioning.  DISCHARGE DIAGNOSES: 1. Status post open reduction and internal fixation of the displaced     interarticular fracture of the distal radius, left side. 2. Status post open reduction and internal fixation of the     intertrochanteric femur fracture. 3. Chronic left pleural effusion, PleurX catheter is in place in the     left side. 4. History of pneumonia. 5. History of osteomyelitis of the lumbar spine with fracture of L3. 6. The patient is on vancomycin, since January 2012 from August 11, 2010.  The culture is growing coagulase negative staph. 7. Diabetes mellitus. 8. History of ventilatory-dependent respiratory failure. 9. Status post tracheostomy on August 20, 2010. 10.PEG tube placement for malnutrition.  Now, the patient has passed a     swallow evaluation and started on regular diet. 11.End-stage renal disease. 12.History of right arm deep venous thrombosis showing a small     occlusive thrombosis.  The patient is currently on Coumadin.  CONSULTATIONS:  Consults obtained during hospital stay include; 1. Orthopedist consult. 2. Cardiothoracic Surgery  consult.  LABORATORY DATA: 1. Tests performed during the hospital stay include x-ray of the hip     on October 19, 2010, showed displaced intertrochanteric fracture of     the left hip with varus deformity. 2. Wrist x-ray showed on October 19, 2010, showed displaced intra-     articular fracture of the distal radius with dorsal impaction ulnar     styloid avulsion fracture. 3. Chest x-ray on October 19, 2010,  showed mild thickening of the right     pleural space, may be due to pleural effusion or pneumothorax or     tumor.  No pneumothorax or rib fracture. 4. CT without contrast showed no acute abnormality.  Chest x-ray on     October 20, 2010, showed endotracheal tube, 6.1 cm above carina,     similar moderate to right-sided pleural effusion with loculation     adjacent to this.  This most likely is atelectasis.  If not     performed, right-sided pleurocentesis and possible CT should be     considered. 5. Hip x-ray on October 20, 2010, showed ORIF left hip fracture. 6. Chest x-ray on October 20, 2010, again showed no change in large     loculated right effusion and airspace disease. 7. Chest x-ray on October 21, 2010, showed no change bowel loculated     right effusion and volume loss, cannot exclude the mass, new     perihilar airspace disease in the left, considered edema or     pneumonia.  CT of the chest without contrast showed large     complicated pleural effusions of the right hemithorax and by mouth     within the differential, but less likely granular pleural effusion     with the drainage catheter in place, mild cardiomegaly, small     amount of perihepatic ascites. 8. Swallow function study done as of October 23, 2010.  BRIEF HISTORY AND PHYSICAL:  This is a 55 year old Spanish speaking male, who was transferred from the [facility] on October 19, 2010, after the patient had a fall and sustained fracture of the left hip as well as left wrist.  The patient was at the [facility] [address]  from August 26, 2010, to October 19, 2010, and was diagnosed with diskitis and osteomyelitis of lumbar spine.  Lumbar upper extremity DVT and protein- calorie malnutrition and a history of CAD status post bypass graft.  The patient was intubated on October 19, 2010, with acute respiratory failure.  BRIEF HOSPITAL COURSE: 1. Respiratory failure.  The patient was extubated on October 20, 2010,     and has done well after the extubation.  The patient does have     chronic pleural effusion and large complicated effusion on the     right due to the hemothorax and the patient has a PleurX catheter     on the left.  The patient was seen by Cardiothoracic Surgery and at     this time they recommend to continue to observe the patient and     continue to have the PleurX catheter on the left.  They will follow     the patient on the rehab and the plan will be due to the surgery     once the patient is more stable.  The plan is for wax once the     patient is able to get stronger on the physical therapy. 2. History of osteomyelitis and diskitis of the lumbar spine.  As per     the notes from the [facility], the patient has been on     antibiotics, since August 11, 2010, and it is almost more than 2     months now and more than 8 weeks he has been on antibiotics.  I     will try to get ID consult and I have discussed with the rehab     physician and they will get infectious disease consultation to help     with the duration of the antibiotics.  At this time, I am not sure     whether the patient would need long-term antibiotics, but we will     defer this decision as per Infectious Disease.  At this time, the     patient is on antibiotics including the vancomycin.  We will     continue the patient on the antibiotics until the Infectious     Disease to see the patient in the rehab. 3. Right upper extremity deep vein thrombosis.  The patient is     currently on Coumadin and will be continued on  that. 4. Diabetes mellitus.  The patient will be continued on the Lantus and     sliding scale of insulin. 5. History of coronary artery disease.  The  patient will be continued     on the metoprolol. 6. Leukocytosis, as above.  The patient will be continued on     antibiotics until infectious disease see the patient and decide     both the duration of the antibiotics. 7. End-stage renal disease.  The patient will be continued on     hemodialysis as per Nephrology.  DISCHARGE PLAN:  The medication on the discharge include; 1. Citalopram 20 mg via tube at bedtime. 2. Clonidine 0.4 mg p.o. q.12 h. 3. Aranesp 200 mcg subcu once a week. 4. Pepcid 20 mg p.o. at bedtime. 5. Fentanyl patch 25 mcg q.72 h. 6. Sliding scale of insulin. 7. Metoprolol 5 mg IV q.6 h. 8. Mupirocin one application b.i.d. 9. Nitroglycerin 0.5 inch q.6 h. 10.Vancomycin protocol. 11.Coumadin per pharmacy. 12.Clonazepam 0.5 mg via tube q.8 h. p.r.n. 13.Labetalol 10 mg IV q.12 h. p.r.n. 14.Ambien 5 mg p.o. at bedtime. 15.Phenergan 12.5 mg p.o. q.6 hours p.r.n. 16.Morphine 2 mg IV q.2 hours p.r.n.     Eleonore Chiquito, MD     GL/MEDQ  D:  10/23/2010  T:  10/23/2010  Job:  [id number]  Electronically Signed by Frederich Chick Abia Monaco  on 10/27/2010 10:03:04 AM

## 2010-10-28 ENCOUNTER — Inpatient Hospital Stay (HOSPITAL_COMMUNITY): Payer: PRIVATE HEALTH INSURANCE

## 2010-10-28 DIAGNOSIS — N186 End stage renal disease: Secondary | ICD-10-CM

## 2010-10-28 DIAGNOSIS — S72009A Fracture of unspecified part of neck of unspecified femur, initial encounter for closed fracture: Secondary | ICD-10-CM

## 2010-10-28 DIAGNOSIS — S52609A Unspecified fracture of lower end of unspecified ulna, initial encounter for closed fracture: Secondary | ICD-10-CM

## 2010-10-28 DIAGNOSIS — M519 Unspecified thoracic, thoracolumbar and lumbosacral intervertebral disc disorder: Secondary | ICD-10-CM

## 2010-10-28 DIAGNOSIS — J869 Pyothorax without fistula: Secondary | ICD-10-CM

## 2010-10-28 DIAGNOSIS — S52509A Unspecified fracture of the lower end of unspecified radius, initial encounter for closed fracture: Secondary | ICD-10-CM

## 2010-10-28 LAB — BASIC METABOLIC PANEL
Calcium: 8.3 mg/dL — ABNORMAL LOW (ref 8.4–10.5)
GFR calc Af Amer: 16 mL/min — ABNORMAL LOW (ref >60)
GFR calc non Af Amer: 13 mL/min — ABNORMAL LOW (ref >60)
Potassium: 4.1 mEq/L (ref 3.5–5.1)
Sodium: 128 mEq/L — ABNORMAL LOW (ref 135–145)

## 2010-10-28 LAB — BLOOD GAS, ARTERIAL
Acid-Base Excess: 1.6 mmol/L (ref 0.0–2.0)
Bicarbonate: 25.9 mEq/L — ABNORMAL HIGH (ref 20.0–24.0)
Drawn by: 10006
FIO2: 21 %
O2 Saturation: 87.8 %
pCO2 arterial: 42.4 mmHg (ref 35.0–45.0)
pO2, Arterial: 54.4 mmHg — ABNORMAL LOW (ref 80.0–100.0)

## 2010-10-28 LAB — GLUCOSE, CAPILLARY
Glucose-Capillary: 261 mg/dL — ABNORMAL HIGH (ref 70–99)
Glucose-Capillary: 164 mg/dL — ABNORMAL HIGH (ref 70–99)

## 2010-10-28 LAB — PROTIME-INR
INR: 1.77 — ABNORMAL HIGH (ref 0.00–1.49)
Prothrombin Time: 20.8 seconds — ABNORMAL HIGH (ref 11.6–15.2)

## 2010-10-28 NOTE — Consult Note (Signed)
  NAMEJERAMIH, BATTANI NO.:  [id number]  MEDICAL RECORD NO.:  [id number]           PATIENT TYPE:  I  LOCATION:  2104                         FACILITY:  [facility]  PHYSICIAN:  [name], M.D.DATE OF BIRTH:  1956/04/21  DATE OF CONSULTATION:  10/19/2010 DATE OF DISCHARGE:                                CONSULTATION   REFERRING PHYSICIAN:  Mali Shelton.  REASON FOR CONSULTATION:  [name] is a 55 year old man who was leaving [facility] here in [place] when he fell sustaining injury to the left hip and left distal radius.  His past medical history is significantly complicated.  He was in [facility] for the past several months with significant pneumonia, had a tracheotomy performed and again was being prepared for discharge tomorrow and fell unfortunately there Today.  He has a past medical history which is well documented including anemia, coronary artery disease, DVT, diabetes, end-stage renal disease, hyperlipidemia, hypertension, osteomyelitis, pneumonia.  Does not smoke or drink and no significant past history otherwise.  He is on multiple medications listed and documented in his chart.  Again he was being prepared to leave [facility] and fell, presents today with x-rays that show distal radius fracture on the left and left intertrochanteric hip fracture.  Exam reveals displaced distal radius fracture on his left side.  Neurovascularly intact, intermittent numbness and tingling, mean distribution, dorsally displaced distal radius fracture and shortening and angulation noted on his x-ray on his wrist.  IMPRESSION:  A 55 year old male with multiple medical problems who had been planned to discharge from [facility] tomorrow who fell, sustained a distal radius fracture, intraarticularly displaced as well as intraarticular fracture on that same side.  At this point in time, I would recommend rigid internal fixation of  distal radius.  Dr. Paralee Cancel will perform ORIF of his left hip fracture concurrently who will do this evening, be admitted to Medical Service, probable ICU stay due to his multiple medical problems.     Leonard Apley Burney Gauze, M.D.     MAW/MEDQ  D:  10/19/2010  T:  10/20/2010  Job:  [id number]  Electronically Signed by Charlotte Crumb M.D. on 10/28/2010 11:28:51 AM

## 2010-10-28 NOTE — Consult Note (Signed)
[name], [name] NO.:  [id number]  MEDICAL RECORD NO.:  [id number]           PATIENT TYPE:  LOCATION:                                 FACILITY:  PHYSICIAN:  Alcide Evener, MD  DATE OF BIRTH:  09/09/1955  DATE OF CONSULTATION: DATE OF DISCHARGE:                                CONSULTATION   REQUESTING PHYSICIAN:  [name] and [name].  REASON FOR INFECTIOUS DISEASE CONSULTATION:  Assistance with treatment of diskitis and also turns out to be an empyema.  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  He was seen by [name] with infectious disease at [facility] and [name] had prescribed him a 6-week course of vancomycin. Unfortunately, the patient was readmitted to the hospital with respiratory failure in February, at which point in time, actually Acinetobacter and Klebsiella pneumoniae were isolated from his respiratory cultures.  He also had an ESBL-Klebsiella pneumoniae and he grew a coagulase staphylococcal species from 1 or 2 blood cultures on February 14.  He received broad-spectrum antibiotics.  He was found to have a complicated empyema and had a chest tube placed in the left side. Ultimately, he has been improving.  He initially had been ventilator dependent and a tracheostomy performed.  He eventually was weaned from the vent and weaned off the tracheostomy tube.  He then fell and sustained a fracture of his radius and intertrochanteric femur fracture and was therefore brought to [facility] where he underwent ORIF of the DVR plate and screws and centered left plate and left carpal tunnel release on the 19 by [name], and then a left intertrochanteric femur fracture open reduction  and internal fixation using DePuy troch nail on the 20.  In the interim, he has been followed closely by [facility], who had the patient on broad-spectrum antibiotics for his empyema, which include vancomycin and Zosyn.  He was seen by [facility] as well as Cardiothoracic Surgery.  Cardiothoracic Surgery plans on decorticating his empyema, but at that time, would like to have him improved while he is on broad-spectrum antibiotics.  We were consulted initially because there was confusion as to why he was on antibiotics that he was on.  I was told initially that he was on vancomycin and Zosyn with plans for 6 weeks of therapy to treat his diskitis, but en route, he has been on his antibiotics because of his empyema, having already completed therapy for his diskitis.  PAST MEDICAL HISTORY: 1. Diskitis as described above. 2. End-stage renal disease, on hemodialysis, Monday, Wednesday, and     Friday. 3. Diabetes mellitus. 4. Coronary artery disease with coronary artery bypass grafting and     stent placed in 2003. 5. Heart failure. 6. Bilateral pleural effusions with left PleurX catheter, loculated     effusion as described above. 7. Right upper extremity DVT. 8. Anemia of chronic disease. 9. Depression.  FRACTURES:  As described above.  SURGERIES:  As described above.  FAMILY HISTORY:  Positive for coronary artery disease.  SOCIAL HISTORY:  The patient is married, independent for 3 months prior to his acute illness.  He quit smoking 20 years ago.  Does not drink. Wife works.  Has multiple children and family members however happy to help him.  ALLERGIES:  No known drug allergies.  CURRENT MEDICATIONS:  Celexa, clonidine, Aranesp, Pepcid, fentanyl, insulin, Lantus, metoprolol, nitroglycerin, pantoprazole, vancomycin, warfarin, clonazepam.  He was on Zosyn as well up until the 20, but this seems to have been stopped.  REVIEW OF SYSTEMS:  As described in the history of  present illness. Otherwise, 12-point review of systems negative.  PHYSICAL EXAMINATION:  VITAL SIGNS:  Temperature maximum is 98.6, temperature current is 98.4, blood pressure 152/73, respirations 20, pulse 87, pulse ox 95% on room air.  He weighs 68 kg. GENERAL:  Pleasant gentleman, in no acute stress. HEENT:  Normocephalic, atraumatic.  Pupils are equal, round, and reactive to light.  Sclerae icteric. NECK:  With healed tracheostomy wound. CARDIOVASCULAR:  Regular rate and rhythm.  No murmurs, gallops, of rubs. LUNGS:  Diminished breath sounds at bases.  He had a PleurX catheter in the left side. ABDOMEN:  Soft, nondistended.  PEG tube in place. EXTREMITIES:  He has ORIF on the left.  His upper extremity also with bandage.  LABORATORY DATA:  CT scan done of the chest on the 22 shows a large complicated pleural effusion on the right, which was thought to be hemothorax versus empyema, left pleural effusion with drainage catheter placed in the left, cardiomegaly, small amount of perihepatic ascites.  Urinalysis showed a small amount of leukocytes and large amount of protein, 7 to 10 white blood cells on the 22.  CBC differential, on 23, white count was 16.4, hemoglobin 9.4, platelets 550, ANC of 12.8. Metabolic panel, sodium [id number], potassium 3.6, chloride 96, bicarb 25, BUN and creatinine 22 and 2.76.  Microbiological data from [facility]:  Blood cultures on the 20 showed no growth.  Urine culture, greater than 100,000 colony-forming units with multiple morphotypes.  Prior culture data from [facility]: 1. October 16, 2010, respiratory culture, routine flora. 2. October 16, 2010, blood cultures x2, no growth to date. 3. October 02, 2010, blood cultures x2, no growth to date. 4. September 18, 2010, respiratory culture, calcoaceticus/baumannii     complex, this was resistant to all cephalosporins, resistant to     Cipro, resistant to carbapenems, resistant to gentamicin,     levofloxacin.  I do not  see a colistin strip.  There was     sensitivity to minocycline with an MIC of 4 to minocycline.     Klebsiella pneumoniae was grown from the 17 as well, there was an     ESBL organism, it was sensitive to ertapenem and imipenem,     otherwise resistant to all other antibiotics tested.  September 15, 2010, culture Acinetobacter baumannii complex, which was resistant     against all antibiotics tested, except for minocycline, which had a     sensitivity to minocycline with an MIC of 2.  Klebsiella pneumonia,     which was an ESBL, again sensitive to ertapenem and imipenem. 5. Respiratory culture, August 23, 2010, yeast. 6. Blood cultures, August 21, 2010, negative.  I should mention     September 15, 2010, had 1 or 2 positive blood cultures for coag     negative staph, which is sensitive to vancomycin,  Bactrim,     tetracycline, rifampin; resistant to penicillin, oxacillin,     ciprofloxacin, clindamycin, erythromycin. 7. On August 22, 2010, urine culture negative.  On August 11, 2010,     lumbar spine, yielded with coag-negative staph.  IMPRESSION/RECOMMENDATIONS:  This is a complicated 55 year old gentleman, who has had problems with lumbar diskitis, treated with vancomycin for more than 6 weeks, who has unfortunately developed ventilatory-dependent respiratory failure, requiring intubation and ultimately tracheostomy tube.  Also, he had loculated effusions, was treated with broad-spectrum antibiotics and grew resistant organisms including extended-spectrum beta lactamase producing Klebsiella pneumonia and Acinetobacter baumannii from respiratory cultures.  He currently is on the rehab floor, having sustained fractures, and now is recovering from this.  He is being seen by Cardiothoracic Surgery, who would like to be perform decortication, but would like to do this when he has improved further and would like himself recovered on broad- spectrum antibiotics.  1. Loculated  pleural effusions.  I agree with keeping the patient on     broad-spectrum antibiotics for now for the empyema.  I think once     daily ertapenem would be reasonable to cover for the ESBL that he     had as well as other gram-negative rods and anaerobes.  I do not     see that he needs vancomycin to cover this particular pathology,     though he may still need some anticoag-negative staph therapy for     his diskitis. 2. We will therefore change him over to ertapenem for his empyema and     follow along with Cardiothoracic Surgery. 3. Diskitis.  The patient is likely resolve this.  I will check a sed     rate, C-reactive protein, although may be falsely elevated in the     context of his empyema.  We will contemplate re-imaging of spine,     although would be cautious the interpretation of his scan.  I do     not see that he needs systemic vancomycin at this point in time for     the diskitis, one could put him on oral therapy with antibiotic     such as doxycycline.     Alcide Evener, MD     CV/MEDQ  D:  10/24/2010  T:  10/25/2010  Job:  [id number]  Electronically Signed by Rhina Brackett DAM MD on 10/28/2010 12:34:44 PM

## 2010-10-28 NOTE — Op Note (Signed)
  NAMEHAWTHORNE, BARANY NO.:  [id number]  MEDICAL RECORD NO.:  [id number]           PATIENT TYPE:  I  LOCATION:  2104                         FACILITY:  [facility]  PHYSICIAN:  Sheral Apley. Lamark Schue, M.D.DATE OF BIRTH:  Mar 12, 1956  DATE OF PROCEDURE:  10/19/2010 DATE OF DISCHARGE:                              OPERATIVE REPORT   PREOPERATIVE DIAGNOSIS:  Displaced intra-articular fracture of distal radius, left side.  POSTOPERATIVE DIAGNOSIS:  Displaced intra-articular fracture of distal radius, left side.  PROCEDURE:  ORIF above with DVR plate and screws, standard left plate and left carpal tunnel release.  SURGEON:  Sheral Apley. Burney Gauze, MD  ASSISTANT:  None.  ANESTHESIA:  General.  TOURNIQUET TIME:  Thirty-eight minutes.  COMPLICATIONS:  None.  DRAINS.:  None.  The patient was taken to operating suite.  After induction of adequate general anesthesia, left upper extremity was prepped and draped insterile fashion.  An Esmarch was used to exsanguinate limb.  Tourniquet was inflated to 250 mmHg.  The tourniquet was placed in the forearm due to dialysis catheter on the arm area of the left side.  Once this was done, the skin was incised of the FCR tendon.  Skin was incised 5-6 cm sheath over the FCR was incised.  The FCR was tracked in midline.  The radial artery to the lateral side level.  The dissection was carried down the level of pronator quadratus.  This was subperiosteally stripped off the distal radius exposing the intra-articular fracture.  The brachioradialis was released and first dorsal compartment released as well, flexion, ulnar deviation, and traction was used to gain reduction. The reduction was then confirmed fluoroscopically.  A standard DVR left plate was placed on the lower aspect distal radius, fixed to the slotted hole.  Intraoperative fluoroscopy was used to determine adequate position.  Once this was done, remaining cortical screw was  placed proximally followed by smooth pegs distally.  Intraoperative fluoroscopy revealed adequate reduction AP, lateral, and oblique view.  The median nerve was identified in the wound tracing the carpal canal.  A path was created dorsal and volar to the transverse carpal ligament, was then divided under vision using a curved blunt scissors. The wound was irrigated and loosely closed with a 4-0 Vicryl Rapide suture.  Xeroform, 4x4s, and volar splint was applied.  The patient tolerated the procedure well and was then operated on by Dr. Paralee Cancel, for operative fixation of his left intertrochanteric hip fracture.     Sheral Apley Burney Gauze, M.D.     MAW/MEDQ  D:  10/19/2010  T:  10/20/2010  Job:  [id number]  Electronically Signed by Charlotte Crumb M.D. on 10/28/2010 11:28:54 AM

## 2010-10-29 ENCOUNTER — Inpatient Hospital Stay (HOSPITAL_COMMUNITY): Payer: PRIVATE HEALTH INSURANCE

## 2010-10-29 LAB — PROTIME-INR
INR: 1.92 — ABNORMAL HIGH (ref 0.00–1.49)
Prothrombin Time: 22.1 seconds — ABNORMAL HIGH (ref 11.6–15.2)

## 2010-10-29 LAB — GLUCOSE, CAPILLARY
Glucose-Capillary: 175 mg/dL — ABNORMAL HIGH (ref 70–99)
Glucose-Capillary: 145 mg/dL — ABNORMAL HIGH (ref 70–99)

## 2010-10-30 DIAGNOSIS — N186 End stage renal disease: Secondary | ICD-10-CM

## 2010-10-30 DIAGNOSIS — J90 Pleural effusion, not elsewhere classified: Secondary | ICD-10-CM

## 2010-10-30 DIAGNOSIS — S72009A Fracture of unspecified part of neck of unspecified femur, initial encounter for closed fracture: Secondary | ICD-10-CM

## 2010-10-30 DIAGNOSIS — S52509A Unspecified fracture of the lower end of unspecified radius, initial encounter for closed fracture: Secondary | ICD-10-CM

## 2010-10-30 DIAGNOSIS — S52609A Unspecified fracture of lower end of unspecified ulna, initial encounter for closed fracture: Secondary | ICD-10-CM

## 2010-10-30 DIAGNOSIS — M519 Unspecified thoracic, thoracolumbar and lumbosacral intervertebral disc disorder: Secondary | ICD-10-CM

## 2010-10-30 LAB — BASIC METABOLIC PANEL
BUN: 55 mg/dL — ABNORMAL HIGH (ref 6–23)
Calcium: 8 mg/dL — ABNORMAL LOW (ref 8.4–10.5)
Creatinine, Ser: 4.76 mg/dL — ABNORMAL HIGH (ref 0.4–1.5)
GFR calc non Af Amer: 13 mL/min — ABNORMAL LOW (ref >60)
Glucose, Bld: 176 mg/dL — ABNORMAL HIGH (ref 70–99)

## 2010-10-30 LAB — GLUCOSE, CAPILLARY
Glucose-Capillary: 103 mg/dL — ABNORMAL HIGH (ref 70–99)
Glucose-Capillary: 145 mg/dL — ABNORMAL HIGH (ref 70–99)

## 2010-10-30 LAB — PROTIME-INR
INR: 2.09 — ABNORMAL HIGH (ref 0.00–1.49)
Prothrombin Time: 23.6 seconds — ABNORMAL HIGH (ref 11.6–15.2)

## 2010-10-31 LAB — GLUCOSE, CAPILLARY

## 2010-10-31 LAB — PROTIME-INR: Prothrombin Time: 26.7 seconds — ABNORMAL HIGH (ref 11.6–15.2)

## 2010-11-01 LAB — GLUCOSE, CAPILLARY
Glucose-Capillary: 132 mg/dL — ABNORMAL HIGH (ref 70–99)
Glucose-Capillary: 53 mg/dL — ABNORMAL LOW (ref 70–99)
Glucose-Capillary: 61 mg/dL — ABNORMAL LOW (ref 70–99)

## 2010-11-01 LAB — PROTIME-INR: Prothrombin Time: 26.8 seconds — ABNORMAL HIGH (ref 11.6–15.2)

## 2010-11-02 ENCOUNTER — Inpatient Hospital Stay (HOSPITAL_COMMUNITY): Payer: PRIVATE HEALTH INSURANCE

## 2010-11-02 DIAGNOSIS — M519 Unspecified thoracic, thoracolumbar and lumbosacral intervertebral disc disorder: Secondary | ICD-10-CM

## 2010-11-02 DIAGNOSIS — J90 Pleural effusion, not elsewhere classified: Secondary | ICD-10-CM

## 2010-11-02 DIAGNOSIS — S52609A Unspecified fracture of lower end of unspecified ulna, initial encounter for closed fracture: Secondary | ICD-10-CM

## 2010-11-02 DIAGNOSIS — N186 End stage renal disease: Secondary | ICD-10-CM

## 2010-11-02 DIAGNOSIS — S52509A Unspecified fracture of the lower end of unspecified radius, initial encounter for closed fracture: Secondary | ICD-10-CM

## 2010-11-02 DIAGNOSIS — S72009A Fracture of unspecified part of neck of unspecified femur, initial encounter for closed fracture: Secondary | ICD-10-CM

## 2010-11-02 LAB — GLUCOSE, CAPILLARY
Glucose-Capillary: 220 mg/dL — ABNORMAL HIGH (ref 70–99)
Glucose-Capillary: 136 mg/dL — ABNORMAL HIGH (ref 70–99)

## 2010-11-03 ENCOUNTER — Inpatient Hospital Stay (HOSPITAL_COMMUNITY): Payer: PRIVATE HEALTH INSURANCE

## 2010-11-03 LAB — GLUCOSE, CAPILLARY
Glucose-Capillary: 149 mg/dL — ABNORMAL HIGH (ref 70–99)
Glucose-Capillary: 161 mg/dL — ABNORMAL HIGH (ref 70–99)

## 2010-11-03 LAB — PROTIME-INR: Prothrombin Time: 32.9 seconds — ABNORMAL HIGH (ref 11.6–15.2)

## 2010-11-04 ENCOUNTER — Inpatient Hospital Stay (HOSPITAL_COMMUNITY): Payer: PRIVATE HEALTH INSURANCE

## 2010-11-04 DIAGNOSIS — J90 Pleural effusion, not elsewhere classified: Secondary | ICD-10-CM

## 2010-11-04 LAB — GLUCOSE, CAPILLARY
Glucose-Capillary: 158 mg/dL — ABNORMAL HIGH (ref 70–99)
Glucose-Capillary: 136 mg/dL — ABNORMAL HIGH (ref 70–99)

## 2010-11-04 LAB — BASIC METABOLIC PANEL
BUN: 47 mg/dL — ABNORMAL HIGH (ref 6–23)
CO2: 24 mEq/L (ref 19–32)
Calcium: 8.5 mg/dL (ref 8.4–10.5)
Chloride: 98 mEq/L (ref 96–112)
Creatinine, Ser: 4.88 mg/dL — ABNORMAL HIGH (ref 0.4–1.5)
GFR calc Af Amer: 15 mL/min — ABNORMAL LOW (ref >60)
GFR calc non Af Amer: 12 mL/min — ABNORMAL LOW (ref >60)
Glucose, Bld: 152 mg/dL — ABNORMAL HIGH (ref 70–99)
Potassium: 4.2 mEq/L (ref 3.5–5.1)
Sodium: 135 mEq/L (ref 135–145)

## 2010-11-04 LAB — PROTIME-INR
INR: 3.33 — ABNORMAL HIGH (ref 0.00–1.49)
Prothrombin Time: 33.8 seconds — ABNORMAL HIGH (ref 11.6–15.2)

## 2010-11-04 LAB — TYPE AND SCREEN
ABO/RH(D): A POS
Antibody Screen: NEGATIVE

## 2010-11-04 LAB — CBC
HCT: 32.5 % — ABNORMAL LOW (ref 39.0–52.0)
Hemoglobin: 10.1 g/dL — ABNORMAL LOW (ref 13.0–17.0)
MCH: 26.4 pg (ref 26.0–34.0)
MCHC: 31.1 g/dL (ref 30.0–36.0)
MCV: 85.1 fL (ref 78.0–100.0)
Platelets: 478 10*3/uL — ABNORMAL HIGH (ref 150–400)
RBC: 3.82 MIL/uL — ABNORMAL LOW (ref 4.22–5.81)
RDW: 16.7 % — ABNORMAL HIGH (ref 11.5–15.5)
WBC: 13.7 10*3/uL — ABNORMAL HIGH (ref 4.0–10.5)

## 2010-11-05 ENCOUNTER — Inpatient Hospital Stay (HOSPITAL_COMMUNITY)
Admission: AD | Admit: 2010-11-05 | Discharge: 2010-11-10 | DRG: 186 | Disposition: A | Discharge status: Rehabilitation Facility | Payer: PRIVATE HEALTH INSURANCE | Source: Ambulatory Visit | Attending: Cardiothoracic Surgery | Admitting: Cardiothoracic Surgery

## 2010-11-05 ENCOUNTER — Other Ambulatory Visit: Payer: Self-pay | Admitting: Physical Medicine & Rehabilitation

## 2010-11-05 ENCOUNTER — Ambulatory Visit (HOSPITAL_COMMUNITY)
Admission: RE | Admit: 2010-11-05 | Discharge: 2010-11-05 | Disposition: A | Discharge status: Home / Self Care | Payer: PRIVATE HEALTH INSURANCE | Source: Ambulatory Visit | Attending: Physical Medicine & Rehabilitation | Admitting: Physical Medicine & Rehabilitation

## 2010-11-05 ENCOUNTER — Ambulatory Visit (HOSPITAL_COMMUNITY): Admission: RE | Admit: 2010-11-05 | Payer: PRIVATE HEALTH INSURANCE | Source: Ambulatory Visit

## 2010-11-05 ENCOUNTER — Other Ambulatory Visit: Payer: Self-pay | Admitting: Cardiothoracic Surgery

## 2010-11-05 DIAGNOSIS — Z9889 Other specified postprocedural states: Secondary | ICD-10-CM

## 2010-11-05 DIAGNOSIS — I509 Heart failure, unspecified: Secondary | ICD-10-CM | POA: Diagnosis present

## 2010-11-05 DIAGNOSIS — J90 Pleural effusion, not elsewhere classified: Principal | ICD-10-CM | POA: Diagnosis present

## 2010-11-05 DIAGNOSIS — I2510 Atherosclerotic heart disease of native coronary artery without angina pectoris: Secondary | ICD-10-CM | POA: Diagnosis present

## 2010-11-05 DIAGNOSIS — N186 End stage renal disease: Secondary | ICD-10-CM | POA: Diagnosis present

## 2010-11-05 DIAGNOSIS — Z86718 Personal history of other venous thrombosis and embolism: Secondary | ICD-10-CM

## 2010-11-05 DIAGNOSIS — I5020 Unspecified systolic (congestive) heart failure: Secondary | ICD-10-CM | POA: Diagnosis present

## 2010-11-05 DIAGNOSIS — F341 Dysthymic disorder: Secondary | ICD-10-CM | POA: Diagnosis present

## 2010-11-05 DIAGNOSIS — Z951 Presence of aortocoronary bypass graft: Secondary | ICD-10-CM

## 2010-11-05 DIAGNOSIS — N039 Chronic nephritic syndrome with unspecified morphologic changes: Secondary | ICD-10-CM | POA: Diagnosis present

## 2010-11-05 DIAGNOSIS — E119 Type 2 diabetes mellitus without complications: Secondary | ICD-10-CM | POA: Diagnosis present

## 2010-11-05 DIAGNOSIS — Z794 Long term (current) use of insulin: Secondary | ICD-10-CM

## 2010-11-05 DIAGNOSIS — I120 Hypertensive chronic kidney disease with stage 5 chronic kidney disease or end stage renal disease: Secondary | ICD-10-CM | POA: Diagnosis present

## 2010-11-05 DIAGNOSIS — D631 Anemia in chronic kidney disease: Secondary | ICD-10-CM | POA: Diagnosis present

## 2010-11-05 DIAGNOSIS — R21 Rash and other nonspecific skin eruption: Secondary | ICD-10-CM | POA: Diagnosis not present

## 2010-11-05 DIAGNOSIS — Z9181 History of falling: Secondary | ICD-10-CM

## 2010-11-05 LAB — CBC
HCT: 32.8 % — ABNORMAL LOW (ref 39.0–52.0)
MCH: 26.2 pg (ref 26.0–34.0)
MCHC: 30.5 g/dL (ref 30.0–36.0)
MCV: 86.1 fL (ref 78.0–100.0)
Platelets: 449 10*3/uL — ABNORMAL HIGH (ref 150–400)
RDW: 16.9 % — ABNORMAL HIGH (ref 11.5–15.5)

## 2010-11-05 LAB — BASIC METABOLIC PANEL
CO2: 30 mEq/L (ref 19–32)
Calcium: 8.6 mg/dL (ref 8.4–10.5)
Chloride: 98 mEq/L (ref 96–112)
Creatinine, Ser: 4.09 mg/dL — ABNORMAL HIGH (ref 0.4–1.5)
GFR calc Af Amer: 18 mL/min — ABNORMAL LOW (ref >60)
Sodium: 136 mEq/L (ref 135–145)

## 2010-11-05 LAB — GRAM STAIN

## 2010-11-05 LAB — GLUCOSE, CAPILLARY
Glucose-Capillary: 185 mg/dL — ABNORMAL HIGH (ref 70–99)
Glucose-Capillary: 240 mg/dL — ABNORMAL HIGH (ref 70–99)

## 2010-11-05 LAB — APTT: aPTT: 47 seconds — ABNORMAL HIGH (ref 24–37)

## 2010-11-06 ENCOUNTER — Inpatient Hospital Stay (HOSPITAL_COMMUNITY): Payer: PRIVATE HEALTH INSURANCE

## 2010-11-06 LAB — BASIC METABOLIC PANEL
BUN: 37 mg/dL — ABNORMAL HIGH (ref 6–23)
CO2: 27 mEq/L (ref 19–32)
Calcium: 8.4 mg/dL (ref 8.4–10.5)
Chloride: 99 mEq/L (ref 96–112)
Creatinine, Ser: 5.18 mg/dL — ABNORMAL HIGH (ref 0.4–1.5)
GFR calc Af Amer: 14 mL/min — ABNORMAL LOW (ref >60)
GFR calc non Af Amer: 12 mL/min — ABNORMAL LOW (ref >60)
Glucose, Bld: 149 mg/dL — ABNORMAL HIGH (ref 70–99)
Potassium: 4 mEq/L (ref 3.5–5.1)
Sodium: 135 mEq/L (ref 135–145)

## 2010-11-06 LAB — CBC
HCT: 33.3 % — ABNORMAL LOW (ref 39.0–52.0)
Hemoglobin: 10.1 g/dL — ABNORMAL LOW (ref 13.0–17.0)
MCH: 26.4 pg (ref 26.0–34.0)
MCHC: 30.3 g/dL (ref 30.0–36.0)
MCV: 86.9 fL (ref 78.0–100.0)
Platelets: 433 10*3/uL — ABNORMAL HIGH (ref 150–400)
RBC: 3.83 MIL/uL — ABNORMAL LOW (ref 4.22–5.81)
RDW: 17 % — ABNORMAL HIGH (ref 11.5–15.5)
WBC: 9.7 10*3/uL (ref 4.0–10.5)

## 2010-11-06 LAB — GLUCOSE, CAPILLARY
Glucose-Capillary: 208 mg/dL — ABNORMAL HIGH (ref 70–99)
Glucose-Capillary: 204 mg/dL — ABNORMAL HIGH (ref 70–99)

## 2010-11-07 ENCOUNTER — Inpatient Hospital Stay (HOSPITAL_COMMUNITY): Payer: PRIVATE HEALTH INSURANCE

## 2010-11-07 LAB — GLUCOSE, CAPILLARY
Glucose-Capillary: 257 mg/dL — ABNORMAL HIGH (ref 70–99)
Glucose-Capillary: 309 mg/dL — ABNORMAL HIGH (ref 70–99)
Glucose-Capillary: 250 mg/dL — ABNORMAL HIGH (ref 70–99)
Glucose-Capillary: 282 mg/dL — ABNORMAL HIGH (ref 70–99)
Glucose-Capillary: 158 mg/dL — ABNORMAL HIGH (ref 70–99)

## 2010-11-07 LAB — CBC
MCH: 26.1 pg (ref 26.0–34.0)
MCHC: 30.4 g/dL (ref 30.0–36.0)
MCV: 85.8 fL (ref 78.0–100.0)
Platelets: 373 10*3/uL (ref 150–400)

## 2010-11-07 LAB — DIFFERENTIAL
Basophils Relative: 0 % (ref 0–1)
Eosinophils Absolute: 0.1 10*3/uL (ref 0.0–0.7)
Eosinophils Relative: 2 % (ref 0–5)
Lymphs Abs: 1.3 10*3/uL (ref 0.7–4.0)
Monocytes Absolute: 0.5 10*3/uL (ref 0.1–1.0)
Monocytes Relative: 6 % (ref 3–12)

## 2010-11-07 LAB — COMPREHENSIVE METABOLIC PANEL
AST: 20 U/L (ref 0–37)
Albumin: 2.2 g/dL — ABNORMAL LOW (ref 3.5–5.2)
BUN: 21 mg/dL (ref 6–23)
Calcium: 8.5 mg/dL (ref 8.4–10.5)
Chloride: 97 mEq/L (ref 96–112)
Creatinine, Ser: 3.33 mg/dL — ABNORMAL HIGH (ref 0.4–1.5)
GFR calc Af Amer: 23 mL/min — ABNORMAL LOW (ref >60)
GFR calc non Af Amer: 19 mL/min — ABNORMAL LOW (ref >60)
Total Bilirubin: 0.6 mg/dL (ref 0.3–1.2)

## 2010-11-07 LAB — PHOSPHORUS: Phosphorus: 3 mg/dL (ref 2.3–4.6)

## 2010-11-08 ENCOUNTER — Inpatient Hospital Stay (HOSPITAL_COMMUNITY): Payer: PRIVATE HEALTH INSURANCE

## 2010-11-08 LAB — GLUCOSE, CAPILLARY
Glucose-Capillary: 137 mg/dL — ABNORMAL HIGH (ref 70–99)
Glucose-Capillary: 82 mg/dL (ref 70–99)
Glucose-Capillary: 260 mg/dL — ABNORMAL HIGH (ref 70–99)
Glucose-Capillary: 277 mg/dL — ABNORMAL HIGH (ref 70–99)
Glucose-Capillary: 92 mg/dL (ref 70–99)

## 2010-11-08 LAB — PROTIME-INR
INR: 1.28 (ref 0.00–1.49)
Prothrombin Time: 16.2 seconds — ABNORMAL HIGH (ref 11.6–15.2)

## 2010-11-09 ENCOUNTER — Inpatient Hospital Stay (HOSPITAL_COMMUNITY): Payer: PRIVATE HEALTH INSURANCE

## 2010-11-09 LAB — BODY FLUID CULTURE: Culture: NO GROWTH

## 2010-11-09 LAB — RENAL FUNCTION PANEL
Albumin: 1.9 g/dL — ABNORMAL LOW (ref 3.5–5.2)
BUN: 61 mg/dL — ABNORMAL HIGH (ref 6–23)
CO2: 25 mEq/L (ref 19–32)
Chloride: 95 mEq/L — ABNORMAL LOW (ref 96–112)
Potassium: 4.6 mEq/L (ref 3.5–5.1)

## 2010-11-09 LAB — CBC
HCT: 30 % — ABNORMAL LOW (ref 39.0–52.0)
Hemoglobin: 9.3 g/dL — ABNORMAL LOW (ref 13.0–17.0)
MCH: 26.1 pg (ref 26.0–34.0)
MCV: 84.3 fL (ref 78.0–100.0)
Platelets: 358 10*3/uL (ref 150–400)
RBC: 3.56 MIL/uL — ABNORMAL LOW (ref 4.22–5.81)
WBC: 9.8 10*3/uL (ref 4.0–10.5)

## 2010-11-09 LAB — GLUCOSE, CAPILLARY
Glucose-Capillary: 198 mg/dL — ABNORMAL HIGH (ref 70–99)
Glucose-Capillary: 225 mg/dL — ABNORMAL HIGH (ref 70–99)
Glucose-Capillary: 272 mg/dL — ABNORMAL HIGH (ref 70–99)
Glucose-Capillary: 295 mg/dL — ABNORMAL HIGH (ref 70–99)

## 2010-11-09 LAB — PROTIME-INR
INR: 1.25 (ref 0.00–1.49)
Prothrombin Time: 15.9 seconds — ABNORMAL HIGH (ref 11.6–15.2)

## 2010-11-10 ENCOUNTER — Inpatient Hospital Stay (HOSPITAL_COMMUNITY): Payer: PRIVATE HEALTH INSURANCE

## 2010-11-10 ENCOUNTER — Inpatient Hospital Stay (HOSPITAL_COMMUNITY)
Admission: RE | Admit: 2010-11-10 | Discharge: 2010-11-20 | DRG: 945 | Disposition: A | Discharge status: Home Health | Payer: PRIVATE HEALTH INSURANCE | Source: Other Acute Inpatient Hospital | Attending: Physical Medicine & Rehabilitation | Admitting: Physical Medicine & Rehabilitation

## 2010-11-10 DIAGNOSIS — Z794 Long term (current) use of insulin: Secondary | ICD-10-CM

## 2010-11-10 DIAGNOSIS — E119 Type 2 diabetes mellitus without complications: Secondary | ICD-10-CM | POA: Diagnosis present

## 2010-11-10 DIAGNOSIS — Z5189 Encounter for other specified aftercare: Principal | ICD-10-CM | POA: Diagnosis present

## 2010-11-10 DIAGNOSIS — IMO0001 Reserved for inherently not codable concepts without codable children: Secondary | ICD-10-CM

## 2010-11-10 DIAGNOSIS — S7290XD Unspecified fracture of unspecified femur, subsequent encounter for closed fracture with routine healing: Secondary | ICD-10-CM

## 2010-11-10 DIAGNOSIS — S72143A Displaced intertrochanteric fracture of unspecified femur, initial encounter for closed fracture: Secondary | ICD-10-CM

## 2010-11-10 DIAGNOSIS — N186 End stage renal disease: Secondary | ICD-10-CM | POA: Diagnosis present

## 2010-11-10 DIAGNOSIS — Z951 Presence of aortocoronary bypass graft: Secondary | ICD-10-CM

## 2010-11-10 DIAGNOSIS — Z9181 History of falling: Secondary | ICD-10-CM

## 2010-11-10 DIAGNOSIS — Z992 Dependence on renal dialysis: Secondary | ICD-10-CM

## 2010-11-10 DIAGNOSIS — Z7901 Long term (current) use of anticoagulants: Secondary | ICD-10-CM

## 2010-11-10 DIAGNOSIS — Z8619 Personal history of other infectious and parasitic diseases: Secondary | ICD-10-CM

## 2010-11-10 DIAGNOSIS — F341 Dysthymic disorder: Secondary | ICD-10-CM | POA: Diagnosis not present

## 2010-11-10 DIAGNOSIS — Z86718 Personal history of other venous thrombosis and embolism: Secondary | ICD-10-CM

## 2010-11-10 DIAGNOSIS — S52599A Other fractures of lower end of unspecified radius, initial encounter for closed fracture: Secondary | ICD-10-CM

## 2010-11-10 DIAGNOSIS — D638 Anemia in other chronic diseases classified elsewhere: Secondary | ICD-10-CM | POA: Diagnosis present

## 2010-11-10 DIAGNOSIS — I2510 Atherosclerotic heart disease of native coronary artery without angina pectoris: Secondary | ICD-10-CM | POA: Diagnosis present

## 2010-11-10 DIAGNOSIS — J90 Pleural effusion, not elsewhere classified: Secondary | ICD-10-CM | POA: Diagnosis present

## 2010-11-10 LAB — GLUCOSE, CAPILLARY
Glucose-Capillary: 45 mg/dL — ABNORMAL LOW (ref 70–99)
Glucose-Capillary: 51 mg/dL — ABNORMAL LOW (ref 70–99)
Glucose-Capillary: 61 mg/dL — ABNORMAL LOW (ref 70–99)
Glucose-Capillary: 106 mg/dL — ABNORMAL HIGH (ref 70–99)
Glucose-Capillary: 162 mg/dL — ABNORMAL HIGH (ref 70–99)

## 2010-11-10 LAB — PROTIME-INR
INR: 1.44 (ref 0.00–1.49)
Prothrombin Time: 17.7 seconds — ABNORMAL HIGH (ref 11.6–15.2)

## 2010-11-10 NOTE — H&P (Signed)
[name], [name] NO.:  [id number]  MEDICAL RECORD NO.:  [id number]           PATIENT TYPE:  I  LOCATION:  [id number]                         FACILITY:  [facility]  PHYSICIAN:  Ivin Poot, M.D.  DATE OF BIRTH:  January 15, 1956  DATE OF ADMISSION:  11/05/2010 DATE OF DISCHARGE:                             HISTORY & PHYSICAL   ADMISSION DIAGNOSIS:  Loculated chronic right pleural effusion- hemothorax.  HISTORY OF PRESENT ILLNESS:  [name] is a 55 year old Hispanic male with a complicated history and hospitalization since January of this year.  He was initially admitted to [facility] with sepsis from spinal osteoarthritis (MRSA) and developed multisystem failure requiring intubation, tracheostomy, and PEG tube. He improved after a long course of IV antibiotics and was transferred to [facility] with his PEG and trach for further vent wean.  At that time, he had bilateral pleural effusions and a Pleurx catheter was placed bilaterally.  The Pleurx catheter on the left side worked well and drained serous fluid, culture negative and eventually cleared the recurrent left pleural effusion.  The right pleural effusion was not drained in all by the Pleurx catheter and the Pleurx catheter was removed at approximately 72 hours after it is insertion being nonfunctional.  The patient was rehabilitated and decannulated from his trach at [facility] and was starting to tolerate a diet without aspiration when he fell just prior to his planned discharge.  This resulted in a left hip fracture and left arm fracture for which he was transferred to [facility] in February and where he is remain hospitalized since.  On the acute hospital service, he recovered from his orthopedic procedures, was unable to ambulate due to his hip fracture and was again transferred to our inpatient rehab.  He was noted to have a thrombus in his right upper extremity which  required oral Coumadin therapy.  During this period of time, a Thoracic Surgical evaluation was requested due to a right pleural effusion, which was large and loculated by CT scan.  It was my impression that he would prior require a VATS for decortication to treat the loculated effusion, but he was in no condition to undergo general anesthesia or single lung ventilation after being recently decannulated from a trach and he was followed closely and transferred to rehab.  On rehab he has improved, he is able to ambulate minimally, but has also had shortness of breath and is nearing discharge from the hospital.  For that reason, a right chest tube is a interim treatment of the hemothorax was recommended he was admitted today via the OR for placement of a right chest tube which was placed in the OR and drained approximately 500 mL of bloody fluid culture and Gram stain negative.  PAST MEDICAL HISTORY: 1. Chronic renal failure, on hemodialysis. 2. Diabetes mellitus. 3. Status post CABG at the [facility] in 2005. 4. Right upper extremity DVT. 5. Anemia of chronic disease. 6. Anxiety - depression. 7. History of diskitis and osteomyelitis of his spine, treated with a     6-week course of IV antibiotics and followed here by ID  8. No known drug allergies.  HOME MEDICATIONS: 1. Lopressor 12.5 b.i.d. 2. Aranesp once weekly. 3. Fentanyl patch 50 mcg q.72 hours. 4. Sliding scale insulin. 5. Oxycodone p.r.n. pain. 6. Protonix 40 mg daily. 7. MiraLax 17 g daily. 8. Renal vitamins once daily. 9. Coumadin per pharmacy. 10.Klonopin 0.5 mg q.8-hour p.r.n. 11.Acetaminophen 650 mg p.o. q.6 h p.r.n. pain. 12.Desyrel 25 mg p.o. at bedtime.  PHYSICAL EXAMINATION:  VITAL SIGNS: Per the computer for stable sinus rhythm, saturation over 95% on room air.  He is afebrile. GENERAL APPEARANCE:  Chronically ill, but no acute distress.  He has a slender on his left arm and he is in a wheelchair.   HEENT: Normocephalic. NECK:  Without JVD or mass.  Breath sounds are diminished on the right, clear on the left.  A Pleurx catheter is present in the left side. ABDOMEN:  Soft, nontender. CARDIAC:  Regular rhythm without murmur or rub. EXTREMITIES: No cyanosis, tenderness, or edema.  He has some slight rash in his proximal lower extremities near the groin.  He is able to ambulate minimally with assistance.  LABORATORY DATA:  His chest x-rays have shown a persistent right pleural effusion with a clear left hemithorax and a Pleurx catheter in place on the left side.  ASSESSMENT AND PLAN: 1. The patient would be admitted via the OR for placement of a right     chest tube for drainage of the right pneumothorax and removal of     the left Pleurx catheter. 2. After he recovers from this procedure, he will be transferred back     to rehab for further rehabilitation before returning home.  I will     continue to follow the patient's right pleural disease for he may     need a formal that is decortication under general anesthesia at     some point if his overall medical condition improves.     Ivin Poot, M.D.     PV/MEDQ  D:  11/10/2010  T:  11/10/2010  Job:  [id number]  Electronically Signed by Ivin Poot M.D. on 11/10/2010 03:58:56 PM

## 2010-11-10 NOTE — Op Note (Signed)
NAMEVITOR, SONDGEROTH NO.:  [id number]  MEDICAL RECORD NO.:  [id number]           PATIENT TYPE:  I  LOCATION:  [id number]                         FACILITY:  [facility]  PHYSICIAN:  Ivin Poot, M.D.  DATE OF BIRTH:  1956-06-17  DATE OF PROCEDURE:  11/05/2010 DATE OF DISCHARGE:                              OPERATIVE REPORT   OPERATION: 1. Removal of left Pleurx catheter. 2. Placement of right 36-French chest tube for drainage of loculated     bloody pleural effusion.  SURGEON:  Ivin Poot, MD  PREOPERATIVE DIAGNOSES: 1. Loculated bloody right pleural effusion, large. 2. Status post left thorax catheter placement with complete resolution     of the left pleural effusion.  POSTOPERATIVE DIAGNOSIS: 1. Loculated bloody right pleural effusion, large. 2. Status post left thorax catheter placement with complete resolution     of the left pleural effusion.  ANESTHESIA:  MAC with local 1% lidocaine and IV conscious sedation monitored by Anesthesia.  INDICATIONS:  The patient is a 55 year old gentleman with a long recent hospitalization for multiple major medical problems, but most recently being rehabilitated after a fall which required left hip replacement and left arm orthopedic for fixation for fractures.  He had-had a chronic right large pleural effusion and an attempt at Pleurx catheter drainage previously was unsuccessful due to the loculated nature.  The patient was progressing with rehab and plans were being made for discharge. However, I felt that discharge home with a large pleural effusion would probably result in immediate rehospitalization for shortness of breath and a chest tube placed in the OR was recommended.  The patient was not felt to be a candidate for VATS due to his multiple comorbidity as listed in the hospital record and his recent prolonged hospitalization including prolonged tracheostomy.  The patient understood that chest tube  drainage would be beneficial, but would not completely clear loculated effusion.  I discussed the procedure in detail with the patient as well as family. They understood the reasoning for the surgery and the associated risks of ventilator dependence, bleeding, infection, and further deterioration.  DESCRIPTION OF PROCEDURE:  The patient was brought to the operative room, placed supine on the operating table.  He was given some IV conscious sedation and monitored by Anesthesia.  First, the left chest was prepped and draped around the previously placed Pleurx catheter. The sutures were divided and the incision was extended.  The Dacron cuff was dissected out of the subcutaneous tissue and the Pleurx catheter was then pulled out in its entirety.  The incision was closed with interrupted 3-0 nylon sutures and a sterile dressing was applied.  Next, the right chest was prepped and draped as a sterile field.  A small 1.5-inch incision was made underneath the right inframammary crease in the fifth interspace.  Of note, a proper time-out had been performed to confirm proper patient, proper site.  Local 1% lidocaine infiltration was used for this in the skin, the intercostal muscle, and into the pleural membrane.  Using hemostat, the right pleural space was entered and immediately some old non-clotted bloody fluid exited.  This was sent  for cytology as well as culture.  Next, the pleural opening was extended with digital manipulation and a #36 chest tube on a Kelly clamp was directed posteriorly into the apex of the right chest space.  This continued to drain bloody fluid and fluctuated well with inspiration. It was connected to an underwater sealed Pleur-Evac collection system and several sutures were placed in the skin to secure the chest tube.  A sterile dressing was applied.  A chest x-ray taken in the operating confirmed proper placement and drainage of majority of the effusion. The  patient was then returned to recovery room in stable condition.     Ivin Poot, M.D.     PV/MEDQ  D:  11/05/2010  T:  11/06/2010  Job:  [id number]  Electronically Signed by Ivin Poot M.D. on 11/10/2010 03:58:43 PM

## 2010-11-11 ENCOUNTER — Inpatient Hospital Stay (HOSPITAL_COMMUNITY): Payer: PRIVATE HEALTH INSURANCE

## 2010-11-11 DIAGNOSIS — S52599A Other fractures of lower end of unspecified radius, initial encounter for closed fracture: Secondary | ICD-10-CM

## 2010-11-11 DIAGNOSIS — J90 Pleural effusion, not elsewhere classified: Secondary | ICD-10-CM

## 2010-11-11 DIAGNOSIS — S72143A Displaced intertrochanteric fracture of unspecified femur, initial encounter for closed fracture: Secondary | ICD-10-CM

## 2010-11-11 LAB — GLUCOSE, CAPILLARY
Glucose-Capillary: 199 mg/dL — ABNORMAL HIGH (ref 70–99)
Glucose-Capillary: 126 mg/dL — ABNORMAL HIGH (ref 70–99)

## 2010-11-11 LAB — CBC
HCT: 31.7 % — ABNORMAL LOW (ref 39.0–52.0)
MCHC: 31.5 g/dL (ref 30.0–36.0)
MCV: 84.5 fL (ref 78.0–100.0)
RDW: 17.4 % — ABNORMAL HIGH (ref 11.5–15.5)

## 2010-11-11 LAB — RENAL FUNCTION PANEL
BUN: 67 mg/dL — ABNORMAL HIGH (ref 6–23)
Calcium: 8 mg/dL — ABNORMAL LOW (ref 8.4–10.5)
Creatinine, Ser: 5.96 mg/dL — ABNORMAL HIGH (ref 0.4–1.5)
Glucose, Bld: 345 mg/dL — ABNORMAL HIGH (ref 70–99)
Phosphorus: 6 mg/dL — ABNORMAL HIGH (ref 2.3–4.6)

## 2010-11-11 NOTE — H&P (Addendum)
[name], [name]            ACCOUNT NO.:  [id number]  MEDICAL RECORD NO.:  [id number]           PATIENT TYPE:  I  LOCATION:  [id number]                         FACILITY:  [facility]  PHYSICIAN:  Meredith Staggers, M.D.DATE OF BIRTH:  Jan 11, 1956  DATE OF ADMISSION:  11/10/2010 DATE OF DISCHARGE:                             HISTORY & PHYSICAL   REASON FOR ADMISSION:  Rehabilitation following diskitis as well as left hip fracture and left distal radius fracture.  HISTORY:  A 55 year old male with onset of diskitis approximately 4 months ago, had osteomyelitis sepsis complicated by pleural effusions, VDRF and bilateral pneumonia.  He was completing his stay at [facility] when he sustained a fall on October 19, 2010 and sustained a left distal radius fracture with displacement as well as left IT hip fracture.  He underwent ORIF of left femur by Dr. Paralee Cancel and underwent ORIF of left distal radius by Dr. Burney Gauze.  Postoperatively, was partial weightbearing left lower extremity and non-weightbearing left wrist.  The patient was transferred to CIR on October 24, 2010 for therapies and developed a large right pleural effusion, taken to the OR on November 05, 2010 by Dr. Prescott Gum.  A left pleural cath was removed and the left chest tube placed for treatment of a loculated right pleural effusion.  Chest tube was discontinued on November 08, 2010.  Chest x-rays showed a partially loculated right small hydropneumothorax, Coumadin was resumed.  The patient was felt to be able to resume his rehab program. Hemodialysis was continued.  The patient's diabetes was monitored and insulin doses have been adjusted.  Pleural fluid cultures have had yielded no growth.  The patient is admitted to resume rehabilitation program, which was interrupted by the large pleural effusion and surgery.  REVIEW OF SYSTEMS:  Positive for weakness particularly left lower extremity, has back pain across the waist.  PAST  MEDICAL HISTORY: 1. Type 2 diabetes. 2. End-stage renal disease. 3. CAD, status post CABG. 4. CHF. 5. History of right upper extremity DVT. 6. Anemia of chronic disease. 7. Depression. 8. Anxiety.  FAMILY HISTORY:  CAD.  PAST SURGICAL HISTORY:  CABG as well as repaired left distal radius fracture, left femur fracture and ORIF.  SOCIAL HISTORY:  Married, lives with his wife, one level home, two and half steps to enter.  Negative tobacco.  Negative EtOH.  Wife and family provide assist postdischarge.  PRIOR FUNCTIONAL HISTORY:  Independent driving prior to January.  MEDICATIONS: 1. Celexa 20 mg a day. 2. Coumadin. 3. Protonix. 4. Nephro-Vite. 5. Nitropaste. 6. Klonopin. 7. Fentanyl patch. 8. Oxycodone.  Last INR 1.44 on November 10, 2010.  Last hemoglobin on November 09, 2010 9.3, white count 9.8, platelets 358,000.  BUN 21, creatinine 3.33, sodium 134, potassium 4.2, albumin 3.2.  PHYSICAL EXAMINATION:  VITAL SIGNS:  Blood pressure 120/50, pulse 76. GENERAL:  This is a well-developed male, looking chronically ill. HEENT:  Eyes:  Anicteric, noninjected.  External ENT:  Normal. NECK:  Supple without adenopathy. LUNGS:  Respiratory effort is good.  Lungs are clear.  He has chest tube site left mid axillary line lower rib level which  is healing well without evidence of drainage or erythema. NEUROLOGIC:  His pedal pulses are intact bilaterally.  He has large area of ecchymosis in left anterior leg and mild edema.  He has petechial hemorrhages at bilateral feet, bruising at the right hand distally and minimal edema at the left ankle.  He has a small amount of redness around the peroneal area, dry skin on the sacrum, small area of opening on the coccyx.  Judgment, mood, memory and orientation are all intact. Cranial nerves II through XII intact.  Deep tendon reflexes normal. Sensations normal. ABDOMEN:  Positive bowel sounds, soft, nontender to palpation.  He has a G-tube site  that is clean and nontender. EXTREMITIES:  His left hip incision is healing well.  One small pinpoint open area produces a drop of dark blood with pressure.  No evidence of erythema.  His left hip range of motion is reduced.  He has a left wrist splint.  POST ADMISSION PHYSICIAN EVALUATION: 1. Functional deficits secondary to left IT femur fracture, left     distal radius fracture, deconditioning with history of pleural     effusions as well as history of diskitis causing back pain. 2. The patient was admitted to receive collaborative interdisciplinary     care between the physiatrist, rehab nursing staff and therapy team. 3. The patient's level of medical complexity and substantial therapy     needs in context of the medical necessity cannot be provide at a     lesser intensive of care. 4. The patient has experienced potential functional loss from his     baseline.  Upon functional assessment at the time of preadmission     screening, the patient was at a min-assist level upper body care,     max-assist lower body care, mod-to-min transfers, and min-to-mod     ambulation with a platform rolling walker.  Judging by the     patient's diagnosis, physical exam and functional history, the     patient has the potential for functional progress which will result     in measurable gains while on the inpatient rehab.  These gains will     be of substantial and practical use upon discharge to home in     facilitating mobility, self-care and independence.  Interim changes     in medical status since preadmission screening are detailed in the     history of present illness. 5. Physiatrist will provide 24-hour management of medical needs as     well as oversight of therapy plan/treatment and provide guidance as     appropriate regarding interactions of the two. 6. A 24-hour rehab nursing will assess in the management of skin,     bowel and bladder, and help to integrate therapy concepts,      techniques, and education. 7. PT will assess and treat for pre-gait training, gait training,     endurance, safety goals are for modified supervision level with     mobility using least restrictive assisted device. 8. OT will assess and treat for ADLs, cognitive perceptual skills,     coordination, equipment goals are for a modified independent level     of upper body care and min-assist lower body ADLs. 9. Case management and social work will assess and treat for     psychosocial issues and discharge planning. 10.Team conference will be held weekly to assess the patient     progress/goals and to determine barriers to discharge. 11.The patient has demonstrated  sufficient medical stability and     exercise capacity to tolerate at least 3 hours of therapy per day     at least 5 days per week. 12.Estimated length of stay is 2 weeks.  Prognosis for further     functional improvement is good.  MEDICAL PROBLEM LIST AND PLAN: 1. Deep venous thrombosis prophylaxis, Coumadin per pharmacy protocol. 2. Pain management, fentanyl patch 50 mcg per hour with p.r.n.     oxycodone. 3. End-stage renal disease, hemodialysis on Monday, Wednesday and     Friday.  Strict I's and O's, renal diet, fluid restriction 1200 mL. 4. Coronary artery disease, on Lopressor b.i.d. 5. Diabetes type 2, decrease 70/30 10 units b.i.d. CBG a.c. and at     bedtime. 6. Anemia of chronic these, continue Aranesp for 2 weeks.  Motivation appears to be good for inpatient rehabilitation.  Mood is bright.  Discussed Rehab Med Services.     Charlett Blake, M.D.   ______________________________ Meredith Staggers, M.D.    AEK/MEDQ  D:  11/10/2010  T:  11/11/2010  Job:  [id number]  cc:   Sheral Apley Burney Gauze, M.D. Ivin Poot, M.D. Pietro Cassis Alvan Dame, M.D.  Electronically Signed by Alysia Penna M.D. on 11/11/2010 10:19:48 AM Electronically Signed by Alger Simons M.D. on 12/02/2010 09:40:15 PM

## 2010-11-12 LAB — GLUCOSE, CAPILLARY
Glucose-Capillary: 104 mg/dL — ABNORMAL HIGH (ref 70–99)
Glucose-Capillary: 112 mg/dL — ABNORMAL HIGH (ref 70–99)

## 2010-11-12 LAB — PTH, INTACT AND CALCIUM: PTH: 58.5 pg/mL (ref 14.0–72.0)

## 2010-11-12 LAB — IRON AND TIBC
Iron: 51 ug/dL (ref 42–135)
TIBC: 175 ug/dL — ABNORMAL LOW (ref 215–435)

## 2010-11-12 LAB — PROTIME-INR: INR: 2.17 — ABNORMAL HIGH (ref 0.00–1.49)

## 2010-11-13 ENCOUNTER — Inpatient Hospital Stay (HOSPITAL_COMMUNITY): Payer: PRIVATE HEALTH INSURANCE

## 2010-11-13 DIAGNOSIS — S52599A Other fractures of lower end of unspecified radius, initial encounter for closed fracture: Secondary | ICD-10-CM

## 2010-11-13 DIAGNOSIS — S72143A Displaced intertrochanteric fracture of unspecified femur, initial encounter for closed fracture: Secondary | ICD-10-CM

## 2010-11-13 LAB — RENAL FUNCTION PANEL
Albumin: 2.4 g/dL — ABNORMAL LOW (ref 3.5–5.2)
CO2: 26 mEq/L (ref 19–32)
Calcium: 8.5 mg/dL (ref 8.4–10.5)
Creatinine, Ser: 5.11 mg/dL — ABNORMAL HIGH (ref 0.4–1.5)
GFR calc Af Amer: 14 mL/min — ABNORMAL LOW (ref >60)
GFR calc non Af Amer: 12 mL/min — ABNORMAL LOW (ref >60)

## 2010-11-13 LAB — GLUCOSE, CAPILLARY
Glucose-Capillary: 218 mg/dL — ABNORMAL HIGH (ref 70–99)
Glucose-Capillary: 124 mg/dL — ABNORMAL HIGH (ref 70–99)

## 2010-11-13 LAB — CBC
MCH: 26.8 pg (ref 26.0–34.0)
MCHC: 30.8 g/dL (ref 30.0–36.0)
Platelets: 380 10*3/uL (ref 150–400)
RDW: 18.4 % — ABNORMAL HIGH (ref 11.5–15.5)

## 2010-11-13 LAB — PROTIME-INR
INR: 2.23 — ABNORMAL HIGH (ref 0.00–1.49)
Prothrombin Time: 24.8 seconds — ABNORMAL HIGH (ref 11.6–15.2)

## 2010-11-14 LAB — GLUCOSE, CAPILLARY
Glucose-Capillary: 216 mg/dL — ABNORMAL HIGH (ref 70–99)
Glucose-Capillary: 126 mg/dL — ABNORMAL HIGH (ref 70–99)

## 2010-11-15 ENCOUNTER — Inpatient Hospital Stay (HOSPITAL_COMMUNITY): Payer: PRIVATE HEALTH INSURANCE

## 2010-11-15 LAB — GLUCOSE, CAPILLARY: Glucose-Capillary: 129 mg/dL — ABNORMAL HIGH (ref 70–99)

## 2010-11-16 ENCOUNTER — Inpatient Hospital Stay (HOSPITAL_COMMUNITY): Payer: PRIVATE HEALTH INSURANCE

## 2010-11-16 LAB — GLUCOSE, CAPILLARY: Glucose-Capillary: 144 mg/dL — ABNORMAL HIGH (ref 70–99)

## 2010-11-16 LAB — RENAL FUNCTION PANEL
Albumin: 2.4 g/dL — ABNORMAL LOW (ref 3.5–5.2)
BUN: 75 mg/dL — ABNORMAL HIGH (ref 6–23)
Calcium: 8.8 mg/dL (ref 8.4–10.5)
Creatinine, Ser: 5.89 mg/dL — ABNORMAL HIGH (ref 0.4–1.5)
Glucose, Bld: 89 mg/dL (ref 70–99)
Phosphorus: 5.1 mg/dL — ABNORMAL HIGH (ref 2.3–4.6)
Potassium: 5.1 mEq/L (ref 3.5–5.1)

## 2010-11-16 LAB — CBC
HCT: 32 % — ABNORMAL LOW (ref 39.0–52.0)
MCH: 27 pg (ref 26.0–34.0)
MCHC: 31.3 g/dL (ref 30.0–36.0)
MCV: 86.3 fL (ref 78.0–100.0)
Platelets: 395 10*3/uL (ref 150–400)
RDW: 18.5 % — ABNORMAL HIGH (ref 11.5–15.5)
WBC: 10.1 10*3/uL (ref 4.0–10.5)

## 2010-11-17 LAB — GLUCOSE, CAPILLARY
Glucose-Capillary: 146 mg/dL — ABNORMAL HIGH (ref 70–99)
Glucose-Capillary: 102 mg/dL — ABNORMAL HIGH (ref 70–99)
Glucose-Capillary: 171 mg/dL — ABNORMAL HIGH (ref 70–99)

## 2010-11-17 NOTE — Discharge Summary (Signed)
NAMEANTORIO, CAVANAUGH NO.:  [id number]  MEDICAL RECORD NO.:  [id number]           PATIENT TYPE:  LOCATION:                                 FACILITY:  PHYSICIAN:  Ivin Poot, M.D.  DATE OF BIRTH:  September 27, 1955  DATE OF ADMISSION: DATE OF DISCHARGE:                              DISCHARGE SUMMARY   FINAL DIAGNOSES: 1. Loculated bloody right pleural effusion, large. 2,  Status post left thorax catheter placement, complete resolution of the left pleural effusion.  SECONDARY DIAGNOSES: 1. End-stage renal disease, on hemodialysis Monday, Wednesday, Friday. 2. Type 2 diabetes mellitus. 3. Coronary artery disease status post coronary artery bypass     grafting. 4. History of congestive heart failure with ejection fraction of 45%. 5. History of bilateral pleural effusions with bilateral PleurX     catheter placed. 6. History of right upper extremity deep vein thrombosis. 7. Anemia of chronic disease. 8. History of depression. 9. History of anxiety. 10.History of diskitis and osteomyelitis. 11.History of being on antibiotics approximately 2 months. 12.History of bilateral pneumonia, VDRL. 13.History of left radius fracture and displacement as well as left     intratrochanteric hip fracture on October 19, 2010.  Status post open     reduction and internal fixation of the left femur fracture and left     distal radius fracture by Dr. Roxy Manns and Dr. Burney Gauze.  IN-HOSPITAL OPERATIONS AND PROCEDURES: 1. Removal of left PleurX catheter. 2. Placement of right 36-French chest tube for drainage of loculated     bloody pleural effusion.  HISTORY AND PHYSICAL AND HOSPITAL COURSE:  The patient is a 55 year old gentleman with a long recent hospitalization for multiple major medical problems, but most recently being rehabilitated after fall, which required open reduction and internal fixation of the left intertrochanteric femur fracture, utilizing DePuy troch nail 11 x 108  mm of lag screw in the distal interlock as well as open reduction and internal fixation of a displaced intra-articular fracture of the distal radius with DVR plate and screws was seen in left plate and left carpal tunnel release.  These procedures were done by Dr. Roxy Manns and Dr. Burney Gauze on October 19, 2010.  The patient had a chronic right large pleural effusion and an attempt at PleurX catheter drainage previously was unsuccessful due to the loculated nature.  The patient was progressing well in rehab and plans were being made for discharge.  However, it was felt that due to the patient's large pleural effusion, we placed him on immediate rehospitalization for shortness of breath and recommended chest tube placement.  It was felt that the patient was not a candidate for that due to multiple comorbid.  Dr. Prescott Gum discussed with the patient, taking him to the operating room and placing a right chest tube and removing his left PleurX catheter.  He discussed the risks and benefits with the patient.  The patient was understanding and agreed to proceed.  The patient was discharged from [facility] and admitted to [facility] on November 05, 2010, where he was taken to the operating room and underwent placement of a right 36-French  chest tube and removal of the left PleurX catheter.  The patient tolerated this procedure well and was transferred to PACU in stable condition.  He was then admitted to [facility].  Postoperatively, daily chest x-rays were obtained.  Chest tube drainage was monitored closely.  Chest x-rays were stable with the right hydropneumothorax.  The patient had no drainage from chest tubes and chest tube was able to be discontinued on November 08, 2010.  The patient's most recent chest x-ray November 09, 2010 shows stable small partial loculated hydropneumothorax with adjacent contusion and/or atelectasis.  No new findings noted.  During this time, the patient  was encouraged to use his incentive spirometer.  He has been able to be weaned off oxygen with O2 saturations maintaining greater than 90% on room air.  On readmission to [facility], Renal was consulted to manage the patient's hemodialysis.  He gets hemodialysis Monday, Wednesday, Friday, and Renal manage this.  Hospitalist were also consulted to assist in the patient's chronic medical problems.  He is a known diabetic, hypertension, right upper extremity DVT.  The patient's vital signs were followed closely.  He is remained afebrile in normal sinus rhythm.  Blood pressure is stable on Lopressor.  The patient's blood sugars were followed closely and he was continued on NovoLog 70/30 as well as NovoLog with meals and at night.  Blood sugars noted to be stable.  The patient had been restarted back on his Coumadin for his right upper extremity DVT.  Daily PT and INR levels were followed.  Most recent INR was 1.25.  Coumadin was adjusted appropriately.  The patient does have a history of acute on chronic anemia.  Most recent hemoglobin was 9.3 and 30.0.  PT and OT were reconsulted to assist in patient's rehab while he was on the floor.  He was progressing well.  The patient's cytology and culture results currently were all negative.  On November 09, 2010, the patient is noted to be afebrile in normal sinus rhythm.  Blood pressure stable.  O2 sats greater than 90% on room air. Sodium of 130, potassium 4.6, chloride of 95, bicarbonate 25, BUN is 61, creatinine 5.92, glucose 242.  Phosphate is 6.0, albumin of 1.9.  White blood cell count 9.8, hemoglobin 9.3, hematocrit of 30, platelet count of 358.  INR 1.25.  The patient is ready for transfer back to [facility] inpatient rehab today.  This has been ordered.  We will follow the patient while he is up in rehab.  We will plan to arrange his follow-up appointment with Dr. Prescott Gum once he is discharged from [facility] inpatient rehab.  INCISIONAL  CARE:  The patient is to wash his incisions using soap and water.  Please contact us if he develops any drainage or opening from any of his incision sites.  DIET:  The patient is to be on a heart-healthy diabetic and renal diet.  DISCHARGE MEDICATIONS: 1. Lopressor 12.5 mg p.o. b.i.d. 2. Nitro-Bid 2% ointment t.i.d. q.6 h. 3. Roxicodone 15 mg p.o. at 7 a.m. and 12 noon. 4. Celexa 20 mg at night. 5. Duragesic - 50 patch, change q.72 hours. 6. Protonix 40 mg b.i.d. 7. MiraLax 17 gram powder daily. 8. Nepro with Carb vanilla liquid 237 mL b.i.d. 9. Coumadin 4 mg times one dose tonight.  I will check daily PT/INR     levels and adjust Coumadin as tolerated. 10.Aranesp 200 mcg on Monday with hemodialysis. 11.NovoLog 70/30 15 units b.i.d. 12.NovoLog 1-9 units  t.i.d. with meal. 13.NovoLog 2-5 units at night. 14.Nephro-Vite 1 tablet at night. 15.Percocet 5/325 1-2 tablets q.4 h. p.r.n. pain. 16.Ambien 5 mg at night p.r.n. insomnia. 17.Robaxin 500 mg q.6 h. p.r.n. spasms. 18.Trazodone 25-50 mg at night p.r.n. insomnia.     Iverson Alamin, PA   ______________________________ Ivin Poot, M.D.    KMD/MEDQ  D:  11/09/2010  T:  11/10/2010  Job:  [id number]  Electronically Signed by Harrel Lemon PA on 11/12/2010 10:26:41 AM Electronically Signed by Ivin Poot M.D. on 11/17/2010 06:47:02 PM

## 2010-11-18 ENCOUNTER — Inpatient Hospital Stay (HOSPITAL_COMMUNITY): Payer: PRIVATE HEALTH INSURANCE

## 2010-11-18 LAB — GLUCOSE, CAPILLARY
Glucose-Capillary: 57 mg/dL — ABNORMAL LOW (ref 70–99)
Glucose-Capillary: 87 mg/dL (ref 70–99)
Glucose-Capillary: 155 mg/dL — ABNORMAL HIGH (ref 70–99)

## 2010-11-18 LAB — RENAL FUNCTION PANEL
Albumin: 2.3 g/dL — ABNORMAL LOW (ref 3.5–5.2)
GFR calc Af Amer: 14 mL/min — ABNORMAL LOW (ref >60)
GFR calc non Af Amer: 12 mL/min — ABNORMAL LOW (ref >60)
Glucose, Bld: 128 mg/dL — ABNORMAL HIGH (ref 70–99)
Phosphorus: 4.6 mg/dL (ref 2.3–4.6)
Potassium: 4.7 mEq/L (ref 3.5–5.1)

## 2010-11-18 LAB — CBC
Hemoglobin: 10.6 g/dL — ABNORMAL LOW (ref 13.0–17.0)
MCH: 27.2 pg (ref 26.0–34.0)
MCHC: 31.5 g/dL (ref 30.0–36.0)
MCV: 86.4 fL (ref 78.0–100.0)
Platelets: 360 10*3/uL (ref 150–400)
RBC: 3.9 MIL/uL — ABNORMAL LOW (ref 4.22–5.81)

## 2010-11-18 LAB — PROTIME-INR: Prothrombin Time: 19.6 seconds — ABNORMAL HIGH (ref 11.6–15.2)

## 2010-11-19 LAB — GLUCOSE, CAPILLARY: Glucose-Capillary: 129 mg/dL — ABNORMAL HIGH (ref 70–99)

## 2010-11-20 ENCOUNTER — Inpatient Hospital Stay (HOSPITAL_COMMUNITY): Payer: PRIVATE HEALTH INSURANCE

## 2010-11-20 DIAGNOSIS — S52599A Other fractures of lower end of unspecified radius, initial encounter for closed fracture: Secondary | ICD-10-CM

## 2010-11-20 DIAGNOSIS — S72143A Displaced intertrochanteric fracture of unspecified femur, initial encounter for closed fracture: Secondary | ICD-10-CM

## 2010-11-20 LAB — RENAL FUNCTION PANEL
BUN: 37 mg/dL — ABNORMAL HIGH (ref 6–23)
CO2: 28 mEq/L (ref 19–32)
Chloride: 98 mEq/L (ref 96–112)
Creatinine, Ser: 4.48 mg/dL — ABNORMAL HIGH (ref 0.4–1.5)

## 2010-11-20 LAB — GLUCOSE, CAPILLARY: Glucose-Capillary: 173 mg/dL — ABNORMAL HIGH (ref 70–99)

## 2010-11-20 LAB — CBC
MCH: 26.7 pg (ref 26.0–34.0)
MCHC: 30.5 g/dL (ref 30.0–36.0)
MCV: 87.4 fL (ref 78.0–100.0)
Platelets: 354 10*3/uL (ref 150–400)
RBC: 4.05 MIL/uL — ABNORMAL LOW (ref 4.22–5.81)

## 2010-11-20 LAB — HEPATITIS B SURFACE ANTIGEN: Hepatitis B Surface Ag: NEGATIVE

## 2010-11-25 NOTE — Consult Note (Signed)
NAMEDJAY, MACNEAL NO.:  [id number]  MEDICAL RECORD NO.:  [id number]           PATIENT TYPE:  LOCATION:                                 FACILITY:  PHYSICIAN:  Windy Kalata, M.D.DATE OF BIRTH:  1956-02-19  DATE OF CONSULTATION:  10/20/2010 DATE OF DISCHARGE:                                CONSULTATION   CHIEF COMPLAINT:  Hip fracture.  HISTORY OF PRESENT ILLNESS:  Mr. Counihan is a 55 year old male who was preparing to leave his long-term acute care hospital when he fell and had a hip and wrist fracture.  He is a Monday, Wednesday, Friday dialysis patient and he missed his hemodialysis on Monday.  His last dialysis was Friday.  His LTAC course was complicated by initially continuing treatment of lumbar vertebral pleural osteomyelitis, additionally vent-dependent respiratory failure with bilateral pneumonia and bilateral pleural effusions.  His hospital course prior to his LTAC was complicated by systemic bacteremia an aspiration event on August 12, 2010, where he aspirated a great deal of liquid and had cardiac arrest and was resuscitated.  Before he was hospitalized.  He did have weakness and malnutrition.  Currently, he feels free well.  He does have some pain following his wrist and hip surgeries; however, he denies any fevers or chills.  He says that he is essentially okay.  PAST MEDICAL HISTORY: 1. End-stage renal disease, Monday, Wednesday, Friday dialysis left     upper extremity AV fistula. 2. Diabetes, insulin dependent. 3. Coronary artery disease status post CABG. 4. Bilateral pleural effusions.  He still has a right pleural effusion     with a Pleurx catheter.  He used to have a left pleural effusion     and a left Pleurx catheter. 5. Right upper extremity DVT, uncertain cause. 6. 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 7. History of aspiration and cardiac arrest with resuscitation. 8. CHF  with an ejection fraction of 45%. 9. Protein-calorie malnutrition. 10.Recent tracheostomy that has been decannulated and is the process     of healing.  PAST SURGICAL HISTORY: 1. Recent trach, which is now closing. 2. Back biopsy showing osteomyelitis. 3. CABG.  MEDICATIONS:  Of note, we do have what appears to be an incomplete list. We are currently still waiting for a complete medication list at discharge from [facility]. 1. Lantus 20 daily. 2. Morphine IR 15 p.o. q.4 h. p.r.n. 3. Clonidine 0.1 p.o. b.i.d. 4. MiraLax 17 g p.o. b.i.d. 5. Zosyn 2.25 q.8 h. 6. Vancomycin 1 g with dialysis. 7. Protonix 40 mg p.o. daily. 8. Lactulose 20 g p.o. b.i.d. 9. Aspirin 81 mg daily. 10.Nitroglycerin paste 1/2 inch q.6 h. 11.Albumin 25 g with dialysis. 12.Metoprolol 5 mg IV q.6 h. as needed. 13.Fentanyl patch 75 mcg q.2 days. 14.Erythromycin p.o. 250 q.8 h. 15.Celexa 20 mg daily. 16.Regular insulin sliding scale.  SOCIAL HISTORY:  Was previously living in a long-term acute care hospital.  Before that, he was bed-bound status, not working  His family history which appears to be noncontributory to this hospitalization.  REVIEW OF SYSTEMS:  No fevers, chills.  He is  weak.  Denies any chest pain, abdominal pain, nausea, vomiting, does note is that of cough otherwise feels well.  Please see HPI.  PHYSICAL EXAMINATION:  VITAL SIGNS:  Temperature 99.1, heart rate 92, respiratory rate 21, blood pressure 165-183 over 50-57, saturation is 92% on 4 liters, weighing 72.4 kg. GENERAL:  This is a thin awake male, alert, no acute distress. HEENT:  Trach site dressing is clear, dry, and intact.  Neck veins are flat.  Moist mucous membranes. HEART:  Regular rate and rhythm.  No murmurs, rubs, or gallops. LUNGS:  Clear on the left.  Diminished on the right.  No crackles noted. Some wheezing. ABDOMEN:  Normoactive bowel sounds, soft, nontender, nondistended.  PEG tube appears to be well.  No  surrounding erythema. EXTREMITIES:  Thin and nonedematous.  His left upper extremity AV fistula is intact with good pulse. NEUROLOGIC:  Alert and oriented x3.  LABORATORY DATA:  CBC, white count of 9.6, hemoglobin 8, platelets 362,000.  Basic metabolic panel significant for potassium of 3.6, BUN 33, creatinine 3.83, calcium 7.7, phos 4.2, mag 2.0.  ASSESSMENT AND PLAN:  A 55 year old male with Monday, Wednesday, Friday end-stage renal who presents with to the hospital with wrist and hip fracture during a fall. 1. End-stage renal disease.  Last dialysis was on Friday.  He missed     yesterday.  Currently, his electrolytes and fluids are okay.  Plan     for his usual 4-hour dialysis with 3 L of ultrafiltration without     heparin.  No need to continue the albumin that he was receiving.     We plan to follow a Tuesday, Thursday, Saturday schedule this week     and resume his normal Monday, Wednesday, Friday next week.  We     still are awaiting full records from [facility] regarding his     dialysis.  He appears not to have EPO, IV iron, vitamin D analogue,     or phosphate binders on his medication rec which we are awaiting     final copy of.  We will address these tomorrow. 2. Anemia.  Likely due to some bleeding into his hip with his recent     fracture, also due to anemia of chronic disease and anemia of end-     stage renal disease with his recent hospitalization and dialysis.     We will consider     iron studies tomorrow.  Still are waiting for records from [facility]     regarding these labs. 3. Blood pressure.  Currently elevated.  Plan for 3 L of     ultrafiltration today and we will assess his blood pressure.  May     need to start adding back some of his home blood pressure     medications and follow.     Lynne Leader, MD   ______________________________ Windy Kalata, M.D.    EC/MEDQ  D:  10/20/2010  T:  10/21/2010  Job:  [id number]  Electronically Signed by Lynne Leader  on 11/12/2010 02:33:01 PM Electronically Signed by Fleet Contras M.D. on 11/25/2010 [id number] PM

## 2010-12-01 NOTE — Consult Note (Signed)
[name], [name]            ACCOUNT NO.:  [id number]  MEDICAL RECORD NO.:  [id number]           PATIENT TYPE:  LOCATION:                                 FACILITY:  PHYSICIAN:  Romero Belling, MD       DATE OF BIRTH:  Jul 26, 1956  DATE OF CONSULTATION:  11/06/2010 DATE OF DISCHARGE:                                CONSULTATION   REASON FOR CONSULTATION:  Medical management.  PRIMARY CARE PHYSICIAN:  At the [facility] in [place].  PRIMARY CARDIOLOGIST:  Dr. Humphrey Rolls in [place]:  This is a 55 year old male who was in inpatient rehab from October 23, 2010 to November 05, 2010.  The patient has had a complicated history.  The patient has been at [facility].  The patient has history of being intubated for respiratory failure.  The patient has diskitis.  The patient was in [place] as well as [facility].  The patient had actually bilateral PleurX catheters per him for bilateral pleural effusions.  The pleural effusions were thought to be parapneumonic effusions secondary to pneumonia and respiratory failure.  The patient reports that he had left PleurX catheter that was removed about 2 weeks ago.  The patient had a right PleurX catheter, which was apparently malfunctioning.  The patient had a chest tube placed by Dr. Ivin Poot on November 05, 2010, and the patient was transferred over to [facility] from inpatient rehab for further management.  Currently, I spoke to the patient.  The patient denies any chest pain, any shortness of breath, any fevers, chills, any nausea, vomiting, abdominal pain, constipation, diarrhea, no problems with bowel movements, no problem with urinary function, and no pain in his legs.  The patient does have a new rash, it looks like on his right hand as well as bilateral lower extremities, which the patient reports that he is asymptomatic from.  The patient has normal vital signs.  The patient's right-sided  chest tube is draining minimal red-tinged fluid about 50 mL per 24 hours.  We are asked to help with further medical management.  PAST MEDICAL HISTORY:  As follows end-stage renal disease on hemodialysis Monday, Wednesday, and Friday, type 2 diabetes mellitus, coronary artery disease with CABG, the patient reports that he had a cardiac catheterization about 1-2 months ago at [facility], which was normal per the patient.  The patient also has a history of congestive heart failure with ejection fraction of 45% per Dr. Lucianne Lei Trigt's note, history of bilateral pleural effusions with bilateral PleurX catheters, history of right upper extremity DVT.  It is not clear if the patient had a PICC line, there are not.  The patient is not sure.  Anemia of chronic disease, depression, anxiety, history of diskitis and osteomyelitis, and history of being on antibiotics for approximately 2 months.  FAMILY HISTORY:  Coronary artery disease.  SOCIAL HISTORY:  The patient is a former smoker, quit 20 years ago. Does not use any alcohol, he does not use any illicit drugs.  The patient is disabled.  REVIEW OF SYSTEMS:  The patient denies any headaches,  blurry vision, any chest pain, or shortness of breath.  He denies any nausea, vomiting, abdominal pain, constipation, or diarrhea.  He denies any fevers and coughing.  He denies any burning on urination.  He denies any diarrhea or constipation.  He denies any pain in his legs.  He does report a rash in bilateral lower extremities and his right hand, which he is asymptomatic for.  PHYSICAL EXAMINATION:  VITAL SIGNS:  Temperature is 97.7, pulse is 74, blood pressure is ranging from [id number] systolic and [id number] diastolic, respiratory rate is ranged from 17-11, and he is saturating 98% on room air. HEAD, EYES, EARS, NOSE, AND THROAT:  Normocephalic, atraumatic.  Pupils are equally, round, and reactive to light. CARDIOVASCULAR:  S1, S2 regular  rate and rhythm.  No murmurs, no rubs. LUNGS:  Clear to auscultation bilaterally.  No wheezes, no rhonchi. There is a right-sided chest tube in place. ABDOMEN:  Soft, nontender, nondistended.  Bowel sounds positive.  No guarding.  No rebound tenderness. EXTREMITIES:  He does have a rash on his lower extremities, there is no lower extremity edema is evident.  He is alert, awake, and oriented x3. He can move all his extremities without difficulty.  CURRENT MEDICATIONS: 1. Lopressor 12.5 mg p.o. b.i.d. 2. Nitro-Bid 2% ointment half an inch transdermal q.6 h. 3. Roxicodone 15 mg p.o. severe pain. 4. Celexa 20 mg p.o. at bedtime. 5. Duragesic patch 50 mcg transdermally change q.72 h. 6. Protonix 40 mg p.o. q.12 h. 7. MiraLax 17 g p.o. daily. 8. Pepcid 20 mg p.o. at bedtime. 9. Nephro with Carb Vanilla liquid 237 mL p.o. b.i.d. 10.Aranesp 200 mcg subcu Monday with hemodialysis. 11.NovoLog 1-9 units subcutaneously t.i.d. with meals, NovoLog 8 units     subcutaneously b.i.d., NovoLog 2-5 units subcu at bedtime. 12.The patient is on Coumadin protocol, however the Coumadin is being     held until November 10, 2010 per CT surgery.13.Nephro-Vite 1 tablet p.o. at bedtime.  The patient's last chest x-ray on November 06, 2010, showed large bore right- sided chest tube in place with persistent hydropneumothorax or decrease in amount of extrapleural gas.  LABORATORY STUDIES:  WBC count 9.7, hemoglobin 10.1, hematocrit 33.3, MCV is 86.9, and platelets of 433.  The patient's hemoglobin is at baseline.  The patient's last INR checked was on November 05, 2010 which was 1.65.  The patient's sodium is 135, potassium 4.0, chloride 99, bicarbonate 27, BUN 37, creatinine 5.18, and glucose is 149.  Calcium is 8.4.  Also the patient has culture of the body fluid and gram-stain is pending of the pleural fluid on the right side.  IMPRESSION AND PLAN:  This is a 55 year old male with multiple medical problems  transferred from inpatient rehab to Step-down Unit secondary chest tube placement for malfunctioning PleurX catheter.  PROBLEM: 1. Right-sided pleural effusion, currently chest tube in placed.  The     patient has improving pleural effusion with the pneumothorax.     Management is per Cardiothoracic Surgery.  The patient's Coumadin     is on hold until November 10, 2010. 2. Right upper extremity deep vein thrombosis.  Again, the patient is     unclear if the patient had a PICC line, there were not.  Currently,     the patient Coumadin is on hold and Coumadin will be started on     November 10, 2010.  The patient is not receiving any heparin, most     likely the patient has blood drainage  from the chest tube. 3. End-stage renal disease on Monday, Wednesday, and Friday.  The     patient will continue hemodialysis.  I spoke to Dr. Elmarie Shiley and     he told me that Dr. Erling Cruz is aware of the patient. 4. Systolic congestive heart failure with ejection fraction of 45%.     The patient is euvolemic.  The patient is on Lopressor.  The     patient is probably not a candidate for an ACE inhibitor secondary     to being on hemodialysis and concern for hyperkalemia. 5. Type 2 diabetes mellitus.  The patient's sugars are slightly     elevated.  We will increase the patient's 70/30 insulin to 10 units     subcutaneously b.i.d. 6. Anemia of chronic kidney disease.  The patient is receiving     Aranesp. 7. Anxiety and depression.  The patient is on Celexa, which will be     continued. 8. Hypertension.  The blood pressure is reasonably well controlled on     Lopressor. 9. Rash.  The patient has a rash on his right hand and left lower     extremity.  We will monitor that. 10.The patient is a full code.     Romero Belling, MD     NH/MEDQ  D:  11/06/2010  T:  11/06/2010  Job:  [id number]  Electronically Signed by Romero Belling MD on 12/01/2010 08:58:34 PM

## 2010-12-02 NOTE — Discharge Summary (Signed)
NAMECAITLIN, [name]            ACCOUNT NO.:  [id number]  MEDICAL RECORD NO.:  [id number]           PATIENT TYPE:  I  LOCATION:  [id number]                         FACILITY:  [facility]  PHYSICIAN:  Meredith Staggers, M.D.DATE OF BIRTH:  Dec 11, 1955  DATE OF ADMISSION:  10/23/2010 DATE OF DISCHARGE:  11/05/2010                              DISCHARGE SUMMARY   DISCHARGE DIAGNOSES: 1. Left femur fracture and left radius fracture. 2. Lumbar diskitis. 3. Right hemothorax. 4. Diabetes mellitus, type 2. 5. End-stage renal disease.  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  He was treated at [facility] and discharged to [facility] for further treatment and therapies.  He had progressed along well with plans of discharge day past fall on October 19, 2010.  He sustained a left distal radius fracture with displacement and left intertrochanteric hip fracture on October 19, 2010.  He was transferred to [facility] and underwent ORIF of left femur by Dr. Alvan Dame and ORIF of left distal radius with left carpal tunnel release by Dr. Burney Gauze.  Postop, he is partial weightbearing on left lower extremity and nonweightbearing on left wrist.  CT of chest done revealed large complicated pleural effusion on right, likely due to hemothorax, and tiny left pleural effusion.  Currently, Pleurx catheter remains in place.  Dr. Prescott Gum was consulted for input and recommends following up on this chronic right loculated effusion with VATS in the future.  MBS was done by Speech Therapy and the patient is currently on regular diet.  Therapies initiated and currently, the patient is requiring cues for safety as well as to maintain partial weightbearing status.  He was evaluated by rehab on October 21, 2010, and it was felt that he would benefit from an inpatient  rehab stay.  PAST MEDICAL HISTORY:  Significant for: 1. End-stage renal disease, on hemodialysis on Monday, Wednesday,     Friday. 2. Diabetes mellitus. 3. Coronary artery disease with CABG and PTCA. 4. CHF. 5. History of bilateral pleural effusions with a left Pleurx catheter     in place. 6. Right upper extremity DVT. 7. Anemia of chronic disease. 8. Depression. 9. 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 10.History of staph osteomyelitis, lumbar spine.  REVIEW OF SYMPTOMS:  Positive for weakness, low back pain, as well as wound care issues.  ALLERGIES:  No known drug allergies.  FAMILY HISTORY:  Positive for coronary artery disease.  SOCIAL HISTORY:  The patient is married, was independent prior to 3 months ago.  Lives in 1-level home with four steps at entry.  Quit tobacco x20 years.  Does not use any alcohol.  Has been disabled secondary to coronary artery disease.  Wife works but there are multiple family members who can assist past discharge.  FUNCTIONAL HISTORY:  The patient was fully independent and driving prior to his hospitalization 3 months ago.  He had progressed to independent level prior to his fall.  FUNCTIONAL STATUS:  The patient is total assist 60% to max assist for bed mobility, mod assist  for transfers, +2 total assist 60% for ambulating 7 feet with a rolling walker.  He is max to total assist for lower body care, mod assist for upper body care.  PHYSICAL EXAMINATION:  VITAL SIGNS:  Blood pressure 157/70, pulse 97, temperature 98.1, respiratory rate 16. GENERAL:  The patient is a pleasant male, thin, alert, and oriented. HEENT:  Pupils equal, round, and reactive to light.  Left eye with ecchymosis which is improving.  Mouth shows oral mucosa to be moist. Borderline dentition. NECK:  Supple without JVD or lymphadenopathy. LUNGS:  Decreased breath sounds at bases and left Pleurx catheter noted. HEART:  Regular rate and rhythm without murmurs, gallops, or  rubs. ABDOMEN:  Soft, nontender with positive bowel sounds.  He has a PEG in place and PEG site is clean, dry, and intact. SKIN:  Notable for few bruises on left knee and on shins that are healing. EXTREMITIES:  The patient with left forearm splint in place that is fitting appropriately.  Left hand is neurovascularly intact although he does lack some extension at fingers.  Left femur incision is clean, dry, intact with staples in place. NEUROLOGIC:  Alert and oriented x3.  Judgment, orientation, memory, mood are intact.  Strength in left upper extremity biceps, triceps at 4/5, left lower extremity is 4/5 at knee flexion, extension, and plantar dorsiflexion.  Right upper and right lower extremities 4-5/5.  HOSPITAL COURSE:  [name] was admitted to rehab on October 19, 2010, for inpatient therapies to consist of PT, OT at least 3 hours 5 days a week.  Past admission physiatrist rehab RN and therapy team have worked together to provide customized collaborative interdisciplinary care.  Rehab RN has worked with the patient on bowel and bladder program as well as pain management issues.  Routine woundcare dressing changes were done to left Pleurx catheter.  Hemodialysis has been ongoing.  The patient has been changed to Monday, Wednesday, Friday schedule as at home.  Coumadin has been monitored by pharmacy per their protocol with INR goals at 2-3.  The pain control is well improved.  He was noted to have issues with hypoxia and has been weaned off O2 by the time of discharge.  Dr. Burney Gauze was consulted on November 03, 2010, regarding left wrist splint.  He felt that splint could be removed and volar splint to be placed for support.  The patient needs to wear splint for heavy loads.  Okay to start left wrist range of motion by OT. He is to follow up with Dr. Burney Gauze in 10-14 days.  Dr. Prescott Gum has been following with input on right pleural effusion.  Most recent chest x-ray of November 03, 2010, shows stable large loculated right pleural effusion, no pneumothorax on the left, and stable ventilation.  The right chest tube is recommended to help treat right pleural effusion. The patient's Coumadin was placed on hold on November 03, 2010, and he was treated with p.o. doses of vitamin K.  INR today is at 1.65, and plans were for the patient to be discharged to Acute with right chest tube to be placed for treatment.  The patient's blood pressures have been checked on b.i.d. basis during this stay.  These are currently ranging from 0000000 to [id number] systolics and diastolics in [id number] to 0000000 range.  CBGs have been checked on a.c. and nightly basis.  Blood sugars have ranged from 140s to 160s range with an occasional high in 200s.  He was noted to  have issues with hypoglycemia on Lantus insulin.  He has been transitioned to 70/30 insulin with dose slowly being titrated for tighter blood sugar control.  Most recent labs of November 05, 2010, revealed sodium 136, potassium 4.8, chloride 98, CO2 is 30, BUN 27, creatinine 4.09, glucose 222.  CBC reveals hemoglobin 10.0, hematocrit 32.8, white count 10.5, platelets 449.  MRSA PCR screening is negative.  During the patient's stay in rehab, weekly team conferences were held to monitor the patient's progress, set goals as well as discuss barriers to discharge.  At admission, the patient was noted to have decreased activity tolerance as well as pain in left hip, limiting movement.  He was also noted to have decrease in functional strength, poor standing balance, and severe impairments in functional mobility.  He required total assist for transfers, min assist for wheelchair propulsion. Currently, the patient is min assist for transfers and balance.  He is able to ambulate 117 feet with min assist with platform rolling walker. Emphasis on wide base of support to prevent left lower extremity from abducting.  PT has also worked on strengthening exercises  of left lower extremity.  OT has worked with the patient on self-care tasks. Currently, the patient is min assist for toilet transfers.  He is at [facility] assist for lower body bathing, dressing, min assist toilet transfers. Speech therapy evaluation was done past admission to monitor for diet tolerance.  The patient was noted to be tolerating regular diet, thin liquids without difficulty and so speech therapy has not followed along during this stay.  At the time of admission to rehab, the patient was on IV vancomycin for treatment of his osteomyelitis diskitis.  ID was consulted for input. MRI of L-spine was done due to his issues with ongoing back pain.  This revealed severe L2-L3 diskitis along with L2-L3 osteomyelitis and significant stenosis at L2-L3 level which was multifactorial in nature. Dr. Megan Salon has reviewed the patient's records and the patient has completed two 6-week courses of IV vancomycin in January completing three full months of therapy as of October 28, 2010.  He felt that the patient's back pain was due to L2-L3 collapse rather than persistent infection.  He felt no further antibiotic treatments were needed; therefore, antibiotics were discontinued.  The patient's back pain is slowly improving with premedication with oxycodone prior to therapy sessions.  He has been afebrile off antibiotics.  The patient has been progressing along well with therapies.  He is set up for right chest tube with drainage in OR for November 05, 2010, and he is to be discharged to Acute Services for this procedure.  On November 05, 2010, the patient is discharged to Acute Services.  DISCHARGE MEDICATIONS: 1. MiraLax 17 g in 8 ounces p.o. per day. 2. Protonix 40 mg a day. 3. Nephro-Vite one p.o. per day. 4. Pepcid 20 mg nightly. 5. Coumadin per pharmacy protocol. 6. Nitro-Bid 2% ointment 0.5 inches q.6 h. 7. Celexa 20 mg p.o. per day. 8. Lopressor 12.5 mg b.i.d. 9. Duragesic patch 50 mcg an hour  change q.72 h. 10.OxyIR 15 mg p.o. scheduled at 7:00 a.m. and at noon prior to     therapy sessions and then 15 mg p.o. q.4 h. p.r.n. moderate-to-     severe pain. 11.Aranesp 200 mcg subcu on Monday with hemodialysis. 12.Insulin 70/30, 8 units subcu b.i.d. 13.Nephro supplements b.i.d. 14.Robaxin 500 mg p.o. q.4 h. p.r.n. spasms. 15.Ambien 5 mg p.o. nightly p.r.n. insomnia.  DIET:  Renal diet  80-2-2 with 1200 mL fluid restriction.  Activity level as well as further changes in meds per Dr. Prescott Gum.     Reesa Chew, P.A.   ______________________________ Meredith Staggers, M.D.    PL/MEDQ  D:  11/05/2010  T:  11/06/2010  Job:  [id number]  cc:   Pietro Cassis Alvan Dame, M.D. Sheral Apley Burney Gauze, M.D. [facility] in [place]  Electronically Signed by Joline Maxcy. on 11/11/2010 02:02:18 PM Electronically Signed by Alger Simons M.D. on 12/02/2010 09:39:54 PM

## 2010-12-02 NOTE — Discharge Summary (Signed)
NAMETRENT, BLONSKI            ACCOUNT NO.:  [id number]  MEDICAL RECORD NO.:  [id number]           PATIENT TYPE:  I  LOCATION:  [id number]                         FACILITY:  [facility]  PHYSICIAN:  Meredith Staggers, M.D.DATE OF BIRTH:  07-Sep-1955  DATE OF ADMISSION:  11/10/2010 DATE OF DISCHARGE:  11/20/2010                              DISCHARGE SUMMARY   DISCHARGE DIAGNOSES: 1. Left intertrochanteric femur fracture and left distal radius     fracture with right pleural effusion. 2. End-stage renal disease. 3. Coronary artery disease. 4. Diabetes mellitus type 2. 5. Anemia of chronic disease. 6. History of right upper extremity deep vein thrombosis. 7. Lumbar diskitis. 8. Anxiety and depression issues improved overall.  HISTORY OF PRESENT ILLNESS:  [name] is a 55 year old male with history of lumbar diskitis with osteomyelitis and sepsis complicated by pleural effusions, VDRF, bilateral pneumonia.  He was discharged from [facility] to [facility] [facility].  There he was completing his stay when he sustained a fall on March 19 with subsequent left distal radius fracture with displacement and left intratrochanteric hip fracture.  See prior discharge summary for full details.  The patient underwent ORIF left femur by Dr. Alvan Dame and ORIF left distal radius by Dr. Burney Gauze. Postop, he is partially weightbearing left lower extremity and nonweightbearing left wrist.  He was transferred to [facility] for therapies.  He was noted to have a large pleural effusion and was taken to OR on April 5 by Dr. Prescott Gum.  Left PleurX cath was discontinued and right CT placed for treatment of loculated bloody large right pleural effusion.  Chest tubes discontinued on April 8.  Chest x-ray shows partially loculated small right hydropneumothorax and there is a question of formal decortication in the future.  Coumadin is resumed. Hemodialysis is currently ongoing on Monday, Wednesday, Friday  basis. The patient is noted to have issues with hypoglycemia and his insulin dose has been adjusted.  Pleural fluid cultures have shown no growth. Therapies were initiated and currently the patient is noted to have decreased balance as well as weakness.  The patient was transferred back to rehab to resume and complete his CIR program.  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  ALLERGIES:  No known drug allergies.  REVIEW OF SYMPTOMS:  Positive for lumbago weakness, sacral pain as well as wound care issues.  FAMILY HISTORY:  Positive for coronary artery disease.  SOCIAL HISTORY:  The patient is married, lives with wife in 1-level home with 2-5 steps at entry.  Does not use any tobacco or alcohol.  He has a supportive family that can assist past discharge.  FUNCTIONAL HISTORY:  The patient was independent and driving prior to January 2012.  FUNCTIONAL STATUS:  The patient is min assist upper body care, mod to max assist lower body care.  He requires min to mod assist for transfers, min to mod assist  for ambulating 300 feet with platform rolling walker with occasional loss of balance.  PHYSICAL EXAMINATION:  VITAL SIGNS;  Blood pressure 120/50, pulse 76, respirations 20. GENERAL:  The patient is well-developed male, looking chronically ill. HEENT:  Eyes anicteric, noninjected.  Oral mucosa is pink and moist with dentition fair. NECK:  Supple without JVD or lymphadenopathy. LUNGS:  Good respiratory effort.  Lungs are clear.  Prior chest tube site, mid maxillary area is healing well.  Prior right chest tube sites with sutures in place. ABDOMEN:  Soft, nontender with positive bowel sounds.  G-tube in  place and site is clean, dry, nontender. EXTREMITIES:  Left hip incision is healing well with a small pinpoint area opened medially with some old dark bloody drainage on dressing.  No evidence of erythema.  Left wrist incision healed well.  Pedal pulses intact bilaterally.  The patient has large area of ecchymosis left anterior leg with mild edema.  He has petechial hemorrhages in bilateral feet with bruising in right hand distally and minimal edema left ankle. SKIN:  Small amount of redness on perineal area and small opening on coccyx.  Otherwise dry skin around sacrum. NEUROLOGIC:  The patient is alert and oriented x3.  Cranial nerves II- XII intact.  Judgment, memory, mood, orientation are intact.  DTRs normal.  Sensation normal.  HOSPITAL COURSE:  [name] was admitted to rehab on November 10, 2010, for inpatient therapies to consist of PT, OT at least 3-hour 5 days a week.  Past admission physiatrist, rehab RN and therapy team have worked together to provide customized collaborative interdisciplinary care.  Rehab RN has worked with the patient on bowel and bladder program as well as wound care monitoring with close monitoring of skin to prevent any further breakdown of sacral area.  Mepilex was used to sacrum to help heal and protect small skin tear.  Hemodialysis was ongoing on Monday, Wednesday, Friday schedule with per renal service. Prior chest tube sutures were discontinued on April 15 without difficulty.  Followup chest x-ray was done.  Dr. Prescott Gum has followed for input.  Followup chest x-ray done on April 15 shows slight decrease in size of right lateral pleural effusion.  The patient has been set up to follow up with Dr. Prescott Gum for postop check on May 9 at 11:00 a.m. with chest x-ray to be done at 10:15 a.m. same day prior to appointment.  The patient's blood pressures have been monitored on b.i.d. basis during this stay.  Blood pressures are averaging from  0000000 systolic, 0000000- [id number] diastolic.  Last weight is 60 kg.  The patient's p.o. intake has been good.  With increase in activity level, the patient was noted to have issues with hypoglycemia and his 70/30 insulin was titrated to prevent these episodes.  Currently, the patient is on 8 units 7:13 a.m., 12 units in p.m. with blood sugars ranging at 104-160 range.  The patient has been continent of bowel and bladder.  He continues on MiraLax to prevent constipation while on narcotics.  Overall pain management in regard to his back is greatly improved with the patient utilizing less p.r.n. meds.  Soft lumbar corset is being used to help for support and pain management.  During the patient's stay in rehab, weekly team conferences were held to monitor the patient's progress, set goals as well as discuss barriers to discharge.  At admission, thepatient was noted to have decrease in functional mobility with decreased strength, decreased balance as well  as increased pain in left lower extremity and back with mobility.  He was overall min assist for bed mobility, required min to mod assist for transfers depending on surface height and min assist for ambulating short distances.  Currently, the patient has made good progress and he has had supervision level for all transfers and mobility.  The patient is modified independent wheelchair level but due to his partial weightbearing status on left lower extremity as well as nonweightbearing status on the left wrist, it is recommended supervision to be provided for any standing of gait due to decrease in balance at this time.  OT has been ongoing to help the patient with ADL tasks.  They have also worked with the patient on range of motion of left wrist and strengthening of left upper extremity. Initially, the patient had min to mod assist for shower transfers.  He required setup with mod assist for self-care tasks.  Currently, the patient is showing  increase independence to sit to stand transfers as well as grooming at sink level while seated.  He is able to perform bathing and dressing while seated with supervision.  The patient is advised to sit for dressing as well as grooming tasks due to decreased dynamic balance.  Further followup home health OT to continue past discharge.  The patient currently continues on Coumadin for treatment of his DVT.  At the time of discharge, he was therapeutic at 2.17.  The patient is discharged on 4 mg Coumadin per day with next protime to be checked on April 24 by [place].  Labs done on April 20 reveals hemoglobin 10.8, hematocrit 35.4, white count 8.1, platelets 354.  Check of lytes revealed sodium 131, potassium 4.4, chloride 98, CO2 28, BUN 37, creatinine 4.48, glucose 158.  On November 20, 2010, the patient is discharged to home.  DISCHARGE MEDICATIONS: 1. PhosLo 1334 mg t.i.d. with meals. 2. Celexa 20 mg p.o. per day. 3. Fentanyl patch 50 mcg change every 3 days, #2 boxes Rx. 4. 70/30 insulin 8 units with breakfast, 12 units with supper. 5. Metoprolol 25 mg b.i.d. 6. Nitro patch 0.2 mg an hour, change q.a.m. 7. Nystatin cream to affected areas b.i.d. p.r.n. 8. OxyIR 10 mg one half q.a.m. and q.6 h. p.r.n. moderate-to-severe     pain #75 Rx. 9. Protonix 40 mg p.o. per day. 10.MiraLax 17 g in 8 ounces p.o. per day. 11.Nephro-Vite 1 p.o. per day. 12.Coumadin 4 mg p.o. q.p.m. 13.Sarna Anti-Itch lotion topically b.i.d. p.r.n.  ACTIVITY:  24-hour supervision.  No strenuous activity.  No alcohol.  SPECIAL INSTRUCTIONS:  No driving, partial weight on left lower extremity with left total hip precautions.  No weight on left wrist for now.  [facility] to provide PT, OT and RN.  Do not use aspirin or other medicines not listed on med sheet.  FOLLOWUP:  The patient to follow up with Dr. Naaman Plummer, May 22 11:10 for 11:40 appointment.  Follow up with [facility] in the next 2  weeks. Follow up with Dr. Prescott Gum May 9 at 11:00 a.m. with chest x-ray to be done on 10:15 a.m.  The patient will need to reschedule this appointment to avoid conflict with his dialysis schedule.  Dr. Alvan Dame in 2 weeks, Dr. Burney Gauze on November 24, 2010, at 10:45.     Reesa Chew, P.A.   ______________________________ Meredith Staggers, M.D.    PL/MEDQ  D:  11/20/2010  T:  11/21/2010  Job:  [id number]  cc:   Pietro Cassis. Alvan Dame, M.D. Sheral Apley Burney Gauze, M.D. Ivin Poot, M.D. [facility]  Electronically Signed by Joline Maxcy. on 11/23/2010 03:00:46 PM Electronically Signed by Alger Simons M.D. on 12/02/2010 09:39:57 PM

## 2010-12-07 ENCOUNTER — Inpatient Hospital Stay (HOSPITAL_COMMUNITY)
Admission: EM | Admit: 2010-12-07 | Discharge: 2010-12-19 | DRG: 477 | Disposition: A | Discharge status: Home Health | Payer: PRIVATE HEALTH INSURANCE | Source: Ambulatory Visit | Attending: Family Medicine | Admitting: Family Medicine

## 2010-12-07 ENCOUNTER — Emergency Department (HOSPITAL_COMMUNITY): Payer: PRIVATE HEALTH INSURANCE

## 2010-12-07 DIAGNOSIS — R339 Retention of urine, unspecified: Secondary | ICD-10-CM | POA: Diagnosis not present

## 2010-12-07 DIAGNOSIS — B958 Unspecified staphylococcus as the cause of diseases classified elsewhere: Secondary | ICD-10-CM | POA: Diagnosis present

## 2010-12-07 DIAGNOSIS — F341 Dysthymic disorder: Secondary | ICD-10-CM | POA: Diagnosis present

## 2010-12-07 DIAGNOSIS — J90 Pleural effusion, not elsewhere classified: Secondary | ICD-10-CM | POA: Diagnosis present

## 2010-12-07 DIAGNOSIS — E119 Type 2 diabetes mellitus without complications: Secondary | ICD-10-CM | POA: Diagnosis present

## 2010-12-07 DIAGNOSIS — Z951 Presence of aortocoronary bypass graft: Secondary | ICD-10-CM

## 2010-12-07 DIAGNOSIS — I2510 Atherosclerotic heart disease of native coronary artery without angina pectoris: Secondary | ICD-10-CM | POA: Diagnosis present

## 2010-12-07 DIAGNOSIS — G061 Intraspinal abscess and granuloma: Secondary | ICD-10-CM | POA: Diagnosis present

## 2010-12-07 DIAGNOSIS — N320 Bladder-neck obstruction: Secondary | ICD-10-CM | POA: Diagnosis not present

## 2010-12-07 DIAGNOSIS — M869 Osteomyelitis, unspecified: Secondary | ICD-10-CM | POA: Diagnosis present

## 2010-12-07 DIAGNOSIS — Z86718 Personal history of other venous thrombosis and embolism: Secondary | ICD-10-CM

## 2010-12-07 DIAGNOSIS — I120 Hypertensive chronic kidney disease with stage 5 chronic kidney disease or end stage renal disease: Secondary | ICD-10-CM | POA: Diagnosis present

## 2010-12-07 DIAGNOSIS — N186 End stage renal disease: Secondary | ICD-10-CM | POA: Diagnosis present

## 2010-12-07 DIAGNOSIS — M519 Unspecified thoracic, thoracolumbar and lumbosacral intervertebral disc disorder: Principal | ICD-10-CM | POA: Diagnosis present

## 2010-12-07 DIAGNOSIS — D631 Anemia in chronic kidney disease: Secondary | ICD-10-CM | POA: Diagnosis present

## 2010-12-07 DIAGNOSIS — N039 Chronic nephritic syndrome with unspecified morphologic changes: Secondary | ICD-10-CM | POA: Diagnosis present

## 2010-12-07 LAB — URINE MICROSCOPIC-ADD ON

## 2010-12-07 LAB — POCT I-STAT, CHEM 8
BUN: 88 mg/dL — ABNORMAL HIGH (ref 6–23)
Chloride: 105 mEq/L (ref 96–112)
Potassium: 5.1 mEq/L (ref 3.5–5.1)
Sodium: 130 mEq/L — ABNORMAL LOW (ref 135–145)

## 2010-12-07 LAB — URINALYSIS, ROUTINE W REFLEX MICROSCOPIC
Ketones, ur: NEGATIVE mg/dL
Leukocytes, UA: NEGATIVE
Nitrite: NEGATIVE
Specific Gravity, Urine: 1.014 (ref 1.005–1.030)
pH: 6 (ref 5.0–8.0)

## 2010-12-07 LAB — SEDIMENTATION RATE: Sed Rate: 4 mm/hr (ref 0–16)

## 2010-12-07 LAB — CBC
Platelets: 297 10*3/uL (ref 150–400)
RBC: 4.78 MIL/uL (ref 4.22–5.81)
WBC: 9.9 10*3/uL (ref 4.0–10.5)

## 2010-12-08 ENCOUNTER — Inpatient Hospital Stay (HOSPITAL_COMMUNITY): Payer: PRIVATE HEALTH INSURANCE

## 2010-12-08 DIAGNOSIS — E119 Type 2 diabetes mellitus without complications: Secondary | ICD-10-CM

## 2010-12-08 DIAGNOSIS — M8610 Other acute osteomyelitis, unspecified site: Secondary | ICD-10-CM

## 2010-12-08 DIAGNOSIS — N186 End stage renal disease: Secondary | ICD-10-CM

## 2010-12-08 LAB — RENAL FUNCTION PANEL
Albumin: 2.5 g/dL — ABNORMAL LOW (ref 3.5–5.2)
CO2: 22 mEq/L (ref 19–32)
Calcium: 9.8 mg/dL (ref 8.4–10.5)
GFR calc Af Amer: 15 mL/min — ABNORMAL LOW (ref >60)
GFR calc non Af Amer: 12 mL/min — ABNORMAL LOW (ref >60)
Phosphorus: 4.6 mg/dL (ref 2.3–4.6)
Sodium: 129 mEq/L — ABNORMAL LOW (ref 135–145)

## 2010-12-08 LAB — URINE CULTURE
Colony Count: NO GROWTH
Culture  Setup Time: 201205071813
Culture: NO GROWTH

## 2010-12-08 LAB — CBC
MCV: 84.4 fL (ref 78.0–100.0)
Platelets: 325 10*3/uL (ref 150–400)
RBC: 4.86 MIL/uL (ref 4.22–5.81)
WBC: 9.5 10*3/uL (ref 4.0–10.5)

## 2010-12-08 LAB — GLUCOSE, CAPILLARY

## 2010-12-08 LAB — HEPARIN LEVEL (UNFRACTIONATED): Heparin Unfractionated: <0.1 IU/mL — ABNORMAL LOW (ref 0.30–0.70)

## 2010-12-08 LAB — C-REACTIVE PROTEIN: CRP: 13.9 mg/dL — ABNORMAL HIGH (ref <0.6)

## 2010-12-09 ENCOUNTER — Ambulatory Visit: Payer: PRIVATE HEALTH INSURANCE | Admitting: Cardiothoracic Surgery

## 2010-12-09 DIAGNOSIS — I369 Nonrheumatic tricuspid valve disorder, unspecified: Secondary | ICD-10-CM

## 2010-12-09 DIAGNOSIS — M519 Unspecified thoracic, thoracolumbar and lumbosacral intervertebral disc disorder: Secondary | ICD-10-CM

## 2010-12-09 LAB — CBC
HCT: 38.2 % — ABNORMAL LOW (ref 39.0–52.0)
MCH: 27.3 pg (ref 26.0–34.0)
MCV: 85.5 fL (ref 78.0–100.0)
Platelets: 304 10*3/uL (ref 150–400)
RBC: 4.47 MIL/uL (ref 4.22–5.81)
RDW: 16 % — ABNORMAL HIGH (ref 11.5–15.5)

## 2010-12-09 LAB — RENAL FUNCTION PANEL
BUN: 34 mg/dL — ABNORMAL HIGH (ref 6–23)
CO2: 26 mEq/L (ref 19–32)
Calcium: 9.3 mg/dL (ref 8.4–10.5)
Glucose, Bld: 180 mg/dL — ABNORMAL HIGH (ref 70–99)
Phosphorus: 4.3 mg/dL (ref 2.3–4.6)

## 2010-12-09 LAB — HEPARIN LEVEL (UNFRACTIONATED): Heparin Unfractionated: <0.1 IU/mL — ABNORMAL LOW (ref 0.30–0.70)

## 2010-12-10 ENCOUNTER — Inpatient Hospital Stay (HOSPITAL_COMMUNITY): Payer: PRIVATE HEALTH INSURANCE

## 2010-12-10 LAB — RENAL FUNCTION PANEL
Albumin: 2.3 g/dL — ABNORMAL LOW (ref 3.5–5.2)
BUN: 47 mg/dL — ABNORMAL HIGH (ref 6–23)
Chloride: 98 mEq/L (ref 96–112)
GFR calc non Af Amer: 17 mL/min — ABNORMAL LOW (ref >60)
Phosphorus: 4.4 mg/dL (ref 2.3–4.6)
Potassium: 3.9 mEq/L (ref 3.5–5.1)
Sodium: 133 mEq/L — ABNORMAL LOW (ref 135–145)

## 2010-12-10 LAB — CBC
Hemoglobin: 11.4 g/dL — ABNORMAL LOW (ref 13.0–17.0)
MCH: 27.1 pg (ref 26.0–34.0)
Platelets: 307 10*3/uL (ref 150–400)
RBC: 4.21 MIL/uL — ABNORMAL LOW (ref 4.22–5.81)
WBC: 8.7 10*3/uL (ref 4.0–10.5)

## 2010-12-10 LAB — GLUCOSE, CAPILLARY
Glucose-Capillary: 203 mg/dL — ABNORMAL HIGH (ref 70–99)
Glucose-Capillary: 272 mg/dL — ABNORMAL HIGH (ref 70–99)
Glucose-Capillary: 179 mg/dL — ABNORMAL HIGH (ref 70–99)

## 2010-12-10 LAB — HEPARIN LEVEL (UNFRACTIONATED): Heparin Unfractionated: <0.1 IU/mL — ABNORMAL LOW (ref 0.30–0.70)

## 2010-12-11 ENCOUNTER — Inpatient Hospital Stay (HOSPITAL_COMMUNITY): Payer: PRIVATE HEALTH INSURANCE

## 2010-12-11 LAB — CBC
HCT: 39.9 % (ref 39.0–52.0)
Hemoglobin: 12.5 g/dL — ABNORMAL LOW (ref 13.0–17.0)
MCH: 26.8 pg (ref 26.0–34.0)
MCHC: 31.3 g/dL (ref 30.0–36.0)
MCV: 85.6 fL (ref 78.0–100.0)
RDW: 15.6 % — ABNORMAL HIGH (ref 11.5–15.5)

## 2010-12-11 LAB — GLUCOSE, CAPILLARY
Glucose-Capillary: 206 mg/dL — ABNORMAL HIGH (ref 70–99)
Glucose-Capillary: 183 mg/dL — ABNORMAL HIGH (ref 70–99)

## 2010-12-12 ENCOUNTER — Inpatient Hospital Stay (HOSPITAL_COMMUNITY): Payer: PRIVATE HEALTH INSURANCE

## 2010-12-12 LAB — RENAL FUNCTION PANEL
Albumin: 2.3 g/dL — ABNORMAL LOW (ref 3.5–5.2)
BUN: 44 mg/dL — ABNORMAL HIGH (ref 6–23)
CO2: 25 mEq/L (ref 19–32)
Chloride: 96 mEq/L (ref 96–112)
Creatinine, Ser: 4.12 mg/dL — ABNORMAL HIGH (ref 0.4–1.5)
GFR calc Af Amer: 18 mL/min — ABNORMAL LOW (ref >60)
GFR calc non Af Amer: 15 mL/min — ABNORMAL LOW (ref >60)
Potassium: 5 mEq/L (ref 3.5–5.1)

## 2010-12-12 LAB — HEPARIN LEVEL (UNFRACTIONATED)
Heparin Unfractionated: 0.18 IU/mL — ABNORMAL LOW (ref 0.30–0.70)
Heparin Unfractionated: 0.55 IU/mL (ref 0.30–0.70)

## 2010-12-12 LAB — CBC
HCT: 35.1 % — ABNORMAL LOW (ref 39.0–52.0)
Hemoglobin: 11.4 g/dL — ABNORMAL LOW (ref 13.0–17.0)
MCH: 27.2 pg (ref 26.0–34.0)
MCHC: 32.5 g/dL (ref 30.0–36.0)
RBC: 4.19 MIL/uL — ABNORMAL LOW (ref 4.22–5.81)

## 2010-12-12 LAB — GLUCOSE, CAPILLARY
Glucose-Capillary: 230 mg/dL — ABNORMAL HIGH (ref 70–99)
Glucose-Capillary: 257 mg/dL — ABNORMAL HIGH (ref 70–99)

## 2010-12-13 LAB — CBC
MCH: 27.6 pg (ref 26.0–34.0)
MCHC: 32.6 g/dL (ref 30.0–36.0)
Platelets: 319 10*3/uL (ref 150–400)

## 2010-12-13 LAB — HEPARIN LEVEL (UNFRACTIONATED): Heparin Unfractionated: 0.5 IU/mL (ref 0.30–0.70)

## 2010-12-13 LAB — BASIC METABOLIC PANEL
BUN: 34 mg/dL — ABNORMAL HIGH (ref 6–23)
CO2: 27 mEq/L (ref 19–32)
Calcium: 9.7 mg/dL (ref 8.4–10.5)
Creatinine, Ser: 3.37 mg/dL — ABNORMAL HIGH (ref 0.4–1.5)
GFR calc non Af Amer: 19 mL/min — ABNORMAL LOW (ref >60)
Glucose, Bld: 230 mg/dL — ABNORMAL HIGH (ref 70–99)

## 2010-12-13 LAB — GLUCOSE, CAPILLARY: Glucose-Capillary: 142 mg/dL — ABNORMAL HIGH (ref 70–99)

## 2010-12-14 ENCOUNTER — Inpatient Hospital Stay (HOSPITAL_COMMUNITY): Payer: PRIVATE HEALTH INSURANCE

## 2010-12-14 DIAGNOSIS — N186 End stage renal disease: Secondary | ICD-10-CM

## 2010-12-14 DIAGNOSIS — M8610 Other acute osteomyelitis, unspecified site: Secondary | ICD-10-CM

## 2010-12-14 DIAGNOSIS — E119 Type 2 diabetes mellitus without complications: Secondary | ICD-10-CM

## 2010-12-14 DIAGNOSIS — J90 Pleural effusion, not elsewhere classified: Secondary | ICD-10-CM

## 2010-12-14 DIAGNOSIS — M7989 Other specified soft tissue disorders: Secondary | ICD-10-CM

## 2010-12-14 LAB — CBC
HCT: 36.3 % — ABNORMAL LOW (ref 39.0–52.0)
Hemoglobin: 11.7 g/dL — ABNORMAL LOW (ref 13.0–17.0)
MCV: 84.4 fL (ref 78.0–100.0)
Platelets: 319 10*3/uL (ref 150–400)
RBC: 4.3 MIL/uL (ref 4.22–5.81)
WBC: 10.9 10*3/uL — ABNORMAL HIGH (ref 4.0–10.5)

## 2010-12-14 LAB — BASIC METABOLIC PANEL
Chloride: 97 mEq/L (ref 96–112)
GFR calc non Af Amer: 16 mL/min — ABNORMAL LOW (ref >60)
Glucose, Bld: 215 mg/dL — ABNORMAL HIGH (ref 70–99)
Potassium: 6 mEq/L — ABNORMAL HIGH (ref 3.5–5.1)
Sodium: 130 mEq/L — ABNORMAL LOW (ref 135–145)

## 2010-12-15 ENCOUNTER — Inpatient Hospital Stay (HOSPITAL_COMMUNITY): Payer: PRIVATE HEALTH INSURANCE

## 2010-12-15 DIAGNOSIS — J90 Pleural effusion, not elsewhere classified: Secondary | ICD-10-CM

## 2010-12-15 LAB — GLUCOSE, CAPILLARY
Glucose-Capillary: 94 mg/dL (ref 70–99)
Glucose-Capillary: 246 mg/dL — ABNORMAL HIGH (ref 70–99)

## 2010-12-15 LAB — HIV-1 RNA, QUALITATIVE, TMA: HIV-1 RNA, Qualitative, TMA: NOT DETECTED

## 2010-12-15 LAB — RENAL FUNCTION PANEL
Albumin: 2.3 g/dL — ABNORMAL LOW (ref 3.5–5.2)
BUN: 52 mg/dL — ABNORMAL HIGH (ref 6–23)
Chloride: 99 mEq/L (ref 96–112)
GFR calc non Af Amer: 14 mL/min — ABNORMAL LOW (ref >60)
Phosphorus: 3.1 mg/dL (ref 2.3–4.6)
Potassium: 5.1 mEq/L (ref 3.5–5.1)
Sodium: 133 mEq/L — ABNORMAL LOW (ref 135–145)

## 2010-12-15 LAB — HEPARIN LEVEL (UNFRACTIONATED): Heparin Unfractionated: <0.1 IU/mL — ABNORMAL LOW (ref 0.30–0.70)

## 2010-12-15 LAB — CBC
HCT: 33.9 % — ABNORMAL LOW (ref 39.0–52.0)
Hemoglobin: 10.6 g/dL — ABNORMAL LOW (ref 13.0–17.0)
MCHC: 31.3 g/dL (ref 30.0–36.0)
RBC: 3.99 MIL/uL — ABNORMAL LOW (ref 4.22–5.81)

## 2010-12-15 LAB — HEPATIC FUNCTION PANEL
AST: 18 U/L (ref 0–37)
Albumin: 2.3 g/dL — ABNORMAL LOW (ref 3.5–5.2)
Total Bilirubin: 0.2 mg/dL — ABNORMAL LOW (ref 0.3–1.2)
Total Protein: 7.2 g/dL (ref 6.0–8.3)

## 2010-12-15 LAB — VANCOMYCIN, TROUGH: Vancomycin Tr: 11.5 ug/mL (ref 10.0–20.0)

## 2010-12-15 MED ORDER — IOHEXOL 300 MG/ML  SOLN
100.0000 mL | Freq: Once | INTRAMUSCULAR | Status: AC | PRN
  Administered 2010-12-15: 50 mL via INTRAVENOUS

## 2010-12-16 ENCOUNTER — Ambulatory Visit: Payer: PRIVATE HEALTH INSURANCE | Admitting: Cardiothoracic Surgery

## 2010-12-16 ENCOUNTER — Inpatient Hospital Stay (HOSPITAL_COMMUNITY): Payer: PRIVATE HEALTH INSURANCE

## 2010-12-16 LAB — GLUCOSE, CAPILLARY: Glucose-Capillary: 94 mg/dL (ref 70–99)

## 2010-12-16 LAB — CULTURE, ROUTINE-ABSCESS

## 2010-12-16 LAB — URINALYSIS, DIPSTICK ONLY
Ketones, ur: 15 mg/dL — AB
Nitrite: POSITIVE — AB
Specific Gravity, Urine: 1.033 — ABNORMAL HIGH (ref 1.005–1.030)
Urobilinogen, UA: 1 mg/dL (ref 0.0–1.0)

## 2010-12-16 LAB — CBC
HCT: 36.8 % — ABNORMAL LOW (ref 39.0–52.0)
MCH: 26.6 pg (ref 26.0–34.0)
MCHC: 31.3 g/dL (ref 30.0–36.0)
MCV: 85 fL (ref 78.0–100.0)
Platelets: 305 10*3/uL (ref 150–400)
RDW: 15.7 % — ABNORMAL HIGH (ref 11.5–15.5)
WBC: 9.1 10*3/uL (ref 4.0–10.5)

## 2010-12-17 ENCOUNTER — Inpatient Hospital Stay (HOSPITAL_COMMUNITY): Payer: PRIVATE HEALTH INSURANCE

## 2010-12-17 LAB — URINE CULTURE: Colony Count: >=100000

## 2010-12-17 LAB — CBC
HCT: 34.1 % — ABNORMAL LOW (ref 39.0–52.0)
Hemoglobin: 10.8 g/dL — ABNORMAL LOW (ref 13.0–17.0)
MCHC: 31.7 g/dL (ref 30.0–36.0)
MCV: 84 fL (ref 78.0–100.0)

## 2010-12-17 LAB — RENAL FUNCTION PANEL
CO2: 27 mEq/L (ref 19–32)
Chloride: 95 mEq/L — ABNORMAL LOW (ref 96–112)
Creatinine, Ser: 5 mg/dL — ABNORMAL HIGH (ref 0.4–1.5)
GFR calc Af Amer: 15 mL/min — ABNORMAL LOW (ref >60)
GFR calc non Af Amer: 12 mL/min — ABNORMAL LOW (ref >60)
Glucose, Bld: 207 mg/dL — ABNORMAL HIGH (ref 70–99)
Sodium: 130 mEq/L — ABNORMAL LOW (ref 135–145)

## 2010-12-17 LAB — GLUCOSE, CAPILLARY
Glucose-Capillary: 243 mg/dL — ABNORMAL HIGH (ref 70–99)
Glucose-Capillary: 138 mg/dL — ABNORMAL HIGH (ref 70–99)

## 2010-12-17 LAB — HEPATITIS B SURFACE ANTIGEN: Hepatitis B Surface Ag: NEGATIVE

## 2010-12-18 LAB — CBC
HCT: 37.3 % — ABNORMAL LOW (ref 39.0–52.0)
MCV: 85.2 fL (ref 78.0–100.0)
Platelets: 314 10*3/uL (ref 150–400)
RBC: 4.38 MIL/uL (ref 4.22–5.81)
RDW: 15.6 % — ABNORMAL HIGH (ref 11.5–15.5)
WBC: 8.6 10*3/uL (ref 4.0–10.5)

## 2010-12-18 LAB — GLUCOSE, CAPILLARY
Glucose-Capillary: 180 mg/dL — ABNORMAL HIGH (ref 70–99)
Glucose-Capillary: 200 mg/dL — ABNORMAL HIGH (ref 70–99)
Glucose-Capillary: 112 mg/dL — ABNORMAL HIGH (ref 70–99)

## 2010-12-19 ENCOUNTER — Inpatient Hospital Stay (HOSPITAL_COMMUNITY)
Admission: RE | Admit: 2010-12-19 | Discharge: 2010-12-19 | Disposition: A | Discharge status: Home / Self Care | Payer: PRIVATE HEALTH INSURANCE | Source: Ambulatory Visit

## 2010-12-19 LAB — RENAL FUNCTION PANEL
BUN: 68 mg/dL — ABNORMAL HIGH (ref 6–23)
CO2: 25 mEq/L (ref 19–32)
Chloride: 90 mEq/L — ABNORMAL LOW (ref 96–112)
Glucose, Bld: 156 mg/dL — ABNORMAL HIGH (ref 70–99)
Phosphorus: 3.1 mg/dL (ref 2.3–4.6)
Potassium: 4.7 mEq/L (ref 3.5–5.1)
Sodium: 128 mEq/L — ABNORMAL LOW (ref 135–145)

## 2010-12-19 LAB — CBC
HCT: 34.2 % — ABNORMAL LOW (ref 39.0–52.0)
Hemoglobin: 11 g/dL — ABNORMAL LOW (ref 13.0–17.0)
MCV: 83.8 fL (ref 78.0–100.0)
RDW: 15.3 % (ref 11.5–15.5)
WBC: 9.3 10*3/uL (ref 4.0–10.5)

## 2010-12-19 LAB — GLUCOSE, CAPILLARY: Glucose-Capillary: 128 mg/dL — ABNORMAL HIGH (ref 70–99)

## 2010-12-22 ENCOUNTER — Encounter
Payer: PRIVATE HEALTH INSURANCE | Attending: Physical Medicine & Rehabilitation | Admitting: Physical Medicine & Rehabilitation

## 2011-01-01 NOTE — Discharge Summary (Signed)
NAMEMORLEY, DEDO            ACCOUNT NO.:  [id number]  MEDICAL RECORD NO.:  [id number]           PATIENT TYPE:  I  LOCATION:  [id number]                         FACILITY:  [facility]  PHYSICIAN:  Talbert Cage, M.D.DATE OF BIRTH:  [birth year]  DATE OF ADMISSION:  12/07/2010 DATE OF DISCHARGE:  12/19/2010                              DISCHARGE SUMMARY   PRIMARY CARE PROVIDER:  [facility] in [place], [place].  DISCHARGE DIAGNOSES: 1. Lumbar diskitis. 2. End-stage renal disease. 3. Type 2 diabetes. 4. Hypertension. 5. Urinary retention, resolving. 6. Depression. 7. Pleural effusion, stable. 8. History of right upper extremity deep venous thrombosis in January     2010. 9. Coronary artery disease status post coronary artery bypass graft in     2005. 10.History of ventilator-dependent respiratory failure February 2012     with Acinetobacter and Klebsiella pneumonia, on respiratory culture     and Klebsiella pneumonia on blood culture.  DISCHARGE MEDICATIONS: 1. Aspirin 81 mg p.o. daily. 2. Bethanechol 5 mg p.o. t.i.d. 3. Celexa 20 mg p.o. nightly. 4. Fentanyl patch 100 mcg transdermally q.72 h. 5. Ibuprofen 800 mg p.o. q.8 h. 6  Lisinopril 20 mg p.o. nightly. 1. Metoprolol 50 mg p.o. b.i.d. 2. Morphine 30 mg SR tab 1 tablet p.o. b.i.d. 3. Nepro 1 shake p.o. t.i.d. p.r.n. 4. Oxycodone 5 mg IR tabs 2 tablets p.o. q.4 h p.r.n. breakthrough     pain. 5. MiraLax 1 tablet by mouth b.i.d. p.r.n. constipation. 6. Protein supplement 30 mL p.o. b.i.d. per nutrition Rx. 7. Senokot S 1-2 tablets p.o. b.i.d. 8. Flomax 0.4 mg p.o. daily. 9. Vancomycin dosed in dialysis Monday, Wednesday, Friday 6-week total     therapy, last dose January 20, 2011. 10.Tylenol 650 mg p.o. q.6 h p.r.n. pain. 11.PhosLo 657 mg 2 tablets p.o. t.i.d. with meals. 12.Insulin 70/30, 10 units q.a.m., 12 units q.p.m. subcutaneously. 13.Nitroglycerin 0.2 mg patch transdermally daily. 14.Nephro-Vite  multivitamin 1 tablet p.o. daily at bedtime. 15.Doxycycline 100mg  po BID. Take abx for 6 weeks, starting 01/20/11 until 03/03/11  Medications which were stopped during this hospital or changed during this hospitalization: 1. Coumadin 3 mg p.o. nightly as the patient had completed DVT     treatment course. 2. Fentanyl patch 50 mcg q.3 days as patch was increased in dosage. 3. Metoprolol 25 mg p.o. b.i.d. as dose was increased. 4. Oxycodone 10 mg IR tabs 50 mg p.o. q.6 p.r.n. as the patient's home     pain regimen was redosed.  CONSULTS: 1. Earleen Newport, MD with Neurosurgery. 2. Alison Murray, MD with Infectious Disease. 3. Ivin Poot, MD with [facility]. 4. Renal Service for assistance with dialysis.  PROCEDURES: 1. MRI of spine on May 7 showing progressive diskitis and     osteomyelitis at L2-L3, enlarging well-formed disk space,     paraspinal, epidural and left psoas abscess, worsening spinal     bilateral lateral recess and foraminal stenosis at L2-L3 due to     epidural abscess. 2. Disk aspiration done under fluoroscopy on May 8 at lumbar L2-L3     disk space. 3. CT lumbar spine showing severe L2-L3  diskitis, osteomyelitis, near     complete destruction of L3 vertebral body and right pedicle     circumferential gas containing and partially calcified phlegmon on     result in severe spinal stenosis involving bilateral psoas muscles     and anteriorly displaces the abdominal aorta. 4. AV fistulogram on May 15 with successful dilation of the cephalic     subclavian AV fistula.  LABORATORY DATA:  On admission, the patient's CBC was stable.  White count of 9.9, hemoglobin of 13.1, platelets of 297.  INR was 1.29.  BMET was remarkable only for sodium of 130, glucose of 211, BUN of 88, creatinine of 5.4.  Throughout hospital course, the patient had low albumin in the mid to low 2s.  CRP was elevated on admission at 13.9. Urinalysis on admission was negative.  HIV was negative.   Culture abscess grew coag-negative staph.  Prior to the day of discharge, the patient's CBC and BMET remained stable.  The patient's hematocrit was 11.9, which ranged throughout this hospital course was 10.6-13.1 averaging in the mid 10.5-11.5.  Urinalysis on May 15 showed 250 glucose, small bili, 15 ketones, moderate blood, greater than 300 protein, positive nitrites, small leukocytes.  Blood culture showed greater than 100,000 colonies of multiple bacterial morphotypes.  BRIEF HOSPITAL COURSE:  This is a 55 year old male with complex medical history starting in January with end-stage renal disease presenting with low back pain, found to be recurrent lumbar diskitis. 1. Lumbar diskitis.  Disk aspiration was done on the second day of     hospitalization.  It was found that the patient had a recurrence of     coag-negative staph.  This was initially treated in January 2012 at     [facility] with 6 weeks of IV vancomycin.  The patient was     started on vanc and Primaxin.  On admission after the disk     aspiration was done, Infectious Disease was consulted and it was     decided that a 6-week course of vancomycin followed by a 6-week     course of doxycycline will be most appropriate as the patient has     already failed a single 6-week course of vanc.  In addition,     Neurosurgery was consulted on the patient's admission as there was     significant destruction of the L2-L3 area.  It was decided by     Neurosurgery that this patient will be a poor surgical candidate     and they preferred to attend medical management with antibiotics     first.  CT was obtained, which did show a severe destruction and     multiple abscesses; however, the patient appeared to be responding     to the antibiotics and remained stable.  So, again Neurosurgery     felt that it would be in the patient's best interest to continue     conservative management.  However, they did note that if the      patient failed this treatment, they would likely go in surgically     for a clean-out.  The patient will be followed by Neurosurgery as     an outpatient. With this diskitis, the patient was in significant     amount of pain.  Initially on admission, the patient was started on     high-dose Dilaudid PCA which was customized with a basal rate as     well as an elevated demand  rate.  The patient came in with the home     fentanyl patch of 50 mcg, this was slowly increased to 100 mcg.  At     that point, we still did not have good control of the patient's     pain, so Toradol was added.  This appeared greatly relieve the     patient's discomfort. With the addition of Toradol, we are able to     discontinue the basal rate on the PCA and transitioned the patient     to a home p.o. regimen of MS Contin, oxycodone IR, ibuprofen and     fentanyl patch.  Initially on admission, the patient was very rigid     and in pain.  On the day prior to discharge, the patient was     sitting in a chair, eating his meals and was able to ambulate with     physical therapy.  Physical therapy did recommend home health PT     which the patient was already receiving after his lumbar diskitis     in January as well as his hip fracture in March 2012. 2. End-stage renal disease.  Renal was consulted as the patient is     typically on Monday, Wednesday, Friday dialysis candidate in     [place].  With the timing of this admission, the patient was     switched to Tuesday, Thursday, Saturday; however, will return to     Monday, Wednesday, Friday schedule.  The patient was initially to     be discharged on May 18, however, after discussing with Renal, it     was felt that it would be more appropriate for the patient to     remain until May 19 so as to get one last dialysis session on     Saturday here and then resuming his normal dialysis on Monday at     his normal dialysis center.  In addition, the patient's 6 weeks  of     IV vancomycin will be dosed at his hemodialysis center.  Renal     physicians at [facility] discussed this plan with the physicians at     outpatient's dialysis center. 3. Type 2 diabetes.  The patient initially came in with elevated blood     sugars likely secondary to the severe infection.  He was kept     relatively well controlled in the ranging on average 175-250.  He     was started on Lantus and sliding scale insulin; however, prior to     his discharged home, he was transitioned back to his home dose of     70/30 and was able to maintain blood sugars in the 120s to 240s     prior to discharge. 4. Hypertension.  The patient came in significantly hypertensive with     systolic blood pressure in the 180s to 200s.  This is likely a     combination of his end-stage renal disease as well as severe pain.     Gradually, his blood pressure medications were adjusted and his     final dose was metoprolol 100 b.i.d. and lisinopril 20 daily.  The     patient's blood pressures ranged in the [id number] systolically on     the days prior to discharge.  He will continue this home regimen as     well as his dialysis to help control his blood pressures on     discharge. 5. Depression/anxiety.  The patient was continued on his home Celexa     while in-house. 6. Urinary retention.  Approximately 1 week into hospitalization on     May 14, it was noted that the patient had decreased urine output.     Even though the patient is end-stage renal disease, he does make     urine multiple times per day and had been voiding spontaneously on     his prior to hospitalization as well as for the first week of     hospitalization.  There was a concern for this, a bladder scan was     done.  He was found to have 690 mL at that time and an out cath was     performed as he was continued to be.  Both renal and primary team     were monitoring his urine output and then had drastically     decreased.  Flomax 0.4  daily and bethanechol 5 mg t.i.d. were     started.  This appears to have improved the urinary retention,     likely there was both an autonomic/neurogenic component from his     type 2 diabetes as well as an obstructive component from his     prostate.  During this acute phase of urinary retention, a UA and     culture were sent after a cath specimen.  Initial cath specimen was     concerning for UTI, however, urine culture grew out multiple     bacterial morphotypes without one predominant.  Therefore, it was     not felt that this was a UTI and the patient was not started on     antibiotics.  In addition, the patient was already on vancomycin at     that time, so no further intervention was done for this. 7. Pleural effusion.  The patient with a known history of right-sided     bloody pleural effusion, he is being followed by Dr. Prescott Gum at     [facility].  The patient was hospitalized at the time of his followup     appointment, therefore Dr. Prescott Gum was consulted to inform that     the patient was in-house.  Dr. Prescott Gum came and saw the patient     during his hospitalization.  Recommendations were greatly     appreciated.  He recommended a repeat chest x-ray which showed a     stable right-sided pleural effusion.  The patient will follow up     with Dr. Prescott Gum as an outpatient to continue following this.  It     was not felt that any acute action is needed to be taken at thattime.  DISCHARGE INSTRUCTIONS:  The patient was instructed to increase activity slowly, walking with assistance and walking with home health physical therapy.  There is no wound care instructions at this time.  He was also encouraged to eat and increase the calorie diet as he does have a low albumin, and he was instructed to keep all dialysis appointments not only because this is important for his ESRD, but also as this is where he will be getting his antibiotic therapy.  FOLLOWUP APPOINTMENTS: [facility], (PCP) 1-2 weeks.  The patient will call for     appointment. 2. Dr. Ellene Route with Neurosurgery.  His office will call with     appointment. 3. Dr. Prescott Gum with [facility].  The patient is to call and make an  appointment.  He is to call and speak with Dawn in order to do so. 4. Dr. Tessa Lerner with PMR.  The patient already had appointment     scheduled for May 22 at 11 and he should keep this appointment.  DISCHARGE CONDITION:  The patient was discharged home in stable medical condition with his pain well controlled on a p.o. regimen as well as home health physical therapy and home health RN    ______________________________ Lorin Glass, MD   ______________________________ Talbert Cage, M.D.    JM/MEDQ  D:  12/18/2010  T:  12/19/2010  Job:  [id number]  cc:   [facility]  Electronically Signed by Lorin Glass MD on 12/20/2010 05:41:15 PM Electronically Signed by Talbert Cage M.D. on 01/01/2011 01:39:45 PM

## 2011-01-20 ENCOUNTER — Ambulatory Visit: Payer: Self-pay | Admitting: Pain Medicine

## 2011-01-28 ENCOUNTER — Ambulatory Visit: Payer: Self-pay | Admitting: Pain Medicine

## 2011-02-04 NOTE — H&P (Signed)
[name], [name]            ACCOUNT NO.:  [id number]  MEDICAL RECORD NO.:  [id number]           PATIENT TYPE:  I  LOCATION:  [id number]                         FACILITY:  [facility]  PHYSICIAN:  Talbert Cage, M.D.DATE OF BIRTH:  [birth year]  DATE OF ADMISSION:  12/07/2010 DATE OF DISCHARGE:                             HISTORY & PHYSICAL   CHIEF COMPLAINT:  Discitis.  PRIMARY CARE PHYSICIAN:  Unassigned.  HISTORY OF PRESENT ILLNESS:  This 55 year old male with an extensive past medical history including recent 6-week hospitalization at [facility] for lumbar discitis in the month of January 2012, end- stage renal disease with hemodialysis Monday, Wednesday and Friday in [place], still making urine, as well as recent hospitalization for left femoral/distal radial fracture here at [facility] who presents with an acute 4-day history of severe lumbar back pain.  The patient was recently admitted to [facility] between August 03, 2010 and August 26, 2010, who was found to have lumbar discitis.  The patient was noted to have grown coagulase and negative staph aureus on biopsy per discharge per [facility] discharge summary to which Infectious Disease was initially consulted in-house while at [facility] and the patient was placed on IV vancomycin and Primaxin over a 6-week period for treatment.  Per the medical record, it was thought that the patient's recent initiation of dialysis around September 2011 (as the patient reports onset of pain as well as lumbar fractures in medical history around the same time) as possible source of infection with questionable mechanism of infection being secondary to hemodialysis.  The patient states that his lumbar back pain has been significantly improved after completion of IV antibiotic course during the January hospitalization at [facility].  The patient states that he has been able to  ambulate with assistance of a walker since January despite his hospitalization, whereas previously before the hospitalization, he was ambulating with minimal assistance.  He has been able to tolerate his dialysis without incident since his January hospitalization.  The patient denies any fever, nausea, vomiting, dysuria and headache.  The patient does report some progressive onset of his low back pain since Friday, Dec 03, 2010.  The patient describes pain is being persistent in nature with minimal relief.  The patient takes baseline 50 mcg of fentanyl patch as well as OxyIR 10 mg p.r.n. q.4-6 hours with minimal relief in pain.  The patient states that he has also has significant lower extremity weakness since onset of low back pain with intermittent radiation of pain to legs bilaterally.  No bowel or bladder incontinence per the patient, though the patient does report chronic constipation which is more difficult to deal with, since onset of low back pain.  The patient does still make urine per report. The patient also reports recent traumatic fall while at home, where the patient was subsequently admitted to [facility] with a noted left intertrochanteric femoral as well as distal radius head fracture to which ORIF of femoral fracture as well as ORIF of distal radius took place during his hospitalization.  This hospitalization was between November 10, 2010 and November 20, 2010,  with the patient also received apparently inpatient rehabilitation.  It was also noted that the patient also was noted to have a right large bloody pleural effusion during this time period at which vascular surgeon was consulted for Pleurx catheter placement which was removed prior to discharge.  The patient denies any episodes of weakness, fever or dizziness associated with dialysis. Prior to this onset, the patient denies any recurrence of shortness of breath, fever or chest pain and the patient denies any  history of endocarditis.  ED COURSE:  In the emergency department, the patient was noted to have an L2-L3 osteomyelitis and discitis on MRI.  Neurosurgery was consulted in the ED with recommendations for inpatient admission and IR-guided biopsy in the morning with holding of IV antibiotics until biopsy as well as Infectious Disease consult.  ALLERGIES:  NKDA.  REVIEW OF SYSTEMS:  Negative except as noted above in HPI.  MEDICATIONS: 1. PhosLo 30 and 34 mg p.o. t.i.d. with meals. 2. Celexa 20 mg p.o. daily. 3. Fentanyl patch 50 mcg to change every 3 days. 4. Insulin 70/30, 8 units with breakfast, 12 units with dinner. 5. Metoprolol 25 mg p.o. b.i.d. 6. Nitro patch 0.2 mg an hour change q.a.m. 7. Nystatin cream to affected areas b.i.d. p.r.n. 8. OxyIR 10 mg one and half tab q.a.m. q.6 h. p.r.n. for mild severe     pain. 9. Protonix 40 mg p.o. daily. 10.MiraLax 17 g with 8 ounces of water per day. 11.Nephro-Vite 1 tab p.o. daily. 12.Coumadin 4 mg p.o. q.p.m. 13.Sarna anti-itch lotion topically b.i.d. p.r.n.  PAST MEDICAL HISTORY: 1. Chronic renal failure with initiation of HD around September 2011     with hemodialysis Monday, Wednesday and Friday in [place]. 2. Type 2 diabetes. 3. Coronary artery disease, status post CABG in 2005. 4. History of right upper extremity DVT. 5. Anemia of chronic disease with baseline hemoglobin around 9-10. 6. History of anxiety and depression on Celexa. 7. History of ventilator-dependent respiratory failure in February     2012, with Acinetobacter Klebsiella pneumonia on respiratory     culture and ESBL Klebsiella pneumonia on blood culture. 8. History of discitis and osteomyelitis L4-L5, treated with 6-week     course of IV antibiotics beginning around August 03, 2010. 9. History of right bloody pleural effusion.  SURGICAL HISTORY: 1. CABG in 2005. 2. Status post removal of right Pleurx catheter, hospitalization of     October 23, 2010 to  Dec 02, 2010, for right large bloody pleural     effusion.  SOCIAL HISTORY:  The patient currently lives with his wife who helps to assist him at home, currently in a walker in a one level house, has multiple children as well as family who have been helping him at home. The patient noted to be previously ambulating and highly functional prior to around January 2012.  The patient denies any tobacco, alcohol or other drug use per patient.  PHYSICAL EXAMINATION:  VITAL SIGNS:  Temperature 98.4, heart rate 69- 129, respirations 16 and 24, blood pressure 156-24/68-100, satting 98% on room air.  GENERAL:  The patient is alert in moderate distress secondary to pain. HEENT:  Normocephalic, atraumatic.  Extraocular movements intact.  No sclerae icterus.  No nuchal rigidity or trismus. CARDIOVASCULAR:  Mild systolic murmur.  Regular rate and rhythm. PULMONARY:  Clear to auscultation bilaterally.  No wheezes, rales or rhonchi. ABDOMEN:  Soft, nontender and nondistended.  Positive bowel sounds. EXTREMITIES:  Positive deep venous stasis and hyperpigmentation in  the lower extremities bilaterally, 2+ peripheral pulses.  No edema.  Left upper extremity forearm fistula with good thrill.  No erythema or purulent drainage on palpation. MUSCULOSKELETAL:  No lumbosacral tenderness and significant low-back pain and lumbar back pain with internal-external rotation of hips bilaterally.  Strength 2-3/5 in lower extremities bilaterally. NEUROLOGIC:  Cranial nerves II through XII grossly intact.  No focal neurological deficits noted.  LABORATORY STUDIES: 1. CBC, white count 9.9, hemoglobin 13.1, hematocrit 40.9 and platelet     count 297. 2. Sed rate of 4. 3. UA with a spec gravity of 1.014, urine glucose 100, large blood,     protein 100, few squamous, rare bacteria. 4. BMET (i-STAT) with sodium 130, potassium 5.1, glucose 211, BUN 88,     creatinine 5.4. 5. MRI of the lumbar spine showing progressive  discitis and     osteomyelitis at L2/L3.  There is enlarging well-formed disk space     paraspinal epidural left psoas abscess as well as worsening spinal     and bilateral recess and foraminal stenosis at L2-L3 due to     epidural abscess.  ASSESSMENT AND PLAN:  This is a 55 year old male with an extensive past medical history including recent hospitalization January 2012, for discitis with recurrence of discitis. 1. Discitis.  This problem will be deferred to neurosurgery for     management.  Neurosurgery is currently planning for an IR-guided     biopsy at a.m. with recommendations to hold antibiotics pending     biopsy and also for Infectious Disease consults for antibiotic     regimen.  The patient is noted to have a quite complicated     infectious history over the last 4-5 months including coag-negative     staph on initial lumbar wound culture during his hospitalization at     [facility] as well as subsequent development of     Acinetobacter/Klebsiella pneumonia and empyema as well as ESBL     Klebsiella pneumonia on blood cultures during subsequent     hospitalizations from February to April.  We will obtain blood and     urine cultures.  Currently, there are no signs of sepsis.  The     patient is afebrile without a white count.  We will place the     patient on a lot of PCA for pain.  His elevated systolic pressures     are likely secondary to significant pain.  However, given the     patient's positive systolic murmur and no noted recent echo, we     will obtain a 2-D echo to rule out cardiogenic source of recurrent     infection. 2. End-stage renal disease.  The patient went to hemodialysis on     Monday as previously scheduled in [place].  We will consult     renal issues setup while in-house.  The patient is still making     urine.  We will continue the patient on Nephro-Vite and PhosLo. 3. Diabetes.  We will place the patient on modest sliding scale while      in-house.  We will titrate it accordingly in the setting of     infection treatment.  We will also check an A1c. 4. Hypertension.  The patient has systolic blood pressures well into     the 240s on presentation with the patient reporting significant, as     he reports 15/10 pain during these episodes of high blood pressure.  The patient will be continued on his home medications including     metoprolol for blood pressure control as well as p.r.n. hydralazine     for constant blood pressures.  I would imagine that once the     patient is placed on Dilaudid PCA, the blood pressures are well     controlled; however, they are still chronically elevated.  We do     have other options including clonidine and hydralazine in addition     to the metoprolol which either one can be placed on a scheduled     basis as necessary. 5. Coronary artery disease.  The patient is currently asymptomatic.     EKG on presentation showed normal sinus rhythm with no significant     ST-T wave abnormalities.  We will place on telemetry, but we will     be able to continue the patient's metoprolol and home aspirin.  The     patient may benefit from this for cardiac protection in the setting     of history of stent placement as well as CABG.  If the patient does     develop chest pain during hospitalization given significant     vascular risk factors as well as prior history, we will definitely     obtain a prompt Cardiology evaluation. 6. History of VTE.  We will continue the patient on Coumadin per     pharmacy.  However, we will hold Coumadin and place on heparin drip     in the setting of pending biopsy.  INR is pending.  No signs of     bleeding or bruising on exam.  The patient is overall     hemodynamically stable as well as hemoglobin stable. 7. History of pleural effusions.  The patient is tentatively scheduled     for followup of his ETS in [facility] on Dec 09, 2010,with     Dr. Prescott Gum while  with plans to followup chest x-ray in the     setting of the patient's recently having a right bloody pleural     effusion, status post Pleurx catheter removal.  We will obtain     followup chest x-ray in-house.  We will also contact vascular     surgeon to make them aware as to whether they may want to see the     patient in-house while getting inpatient evaluation. 8. Anemia secondary to chronic disease.  Baseline hemoglobin 9-10.     Currently, the patient's hemoglobin is 13.1 which may be secondary     to hemoconcentration given overall generalized malaise and pain     from low back pain.  We will continue to clinically follow overall     stable at this point with goal hemoglobin of around 9-10 in the     setting of ESRD. 9. Depression.  We will continue Celexa. 10.Prophylaxis.  Coumadin and Protonix. 11.Disposition pending further evaluation.     Shanda Howells, MD   ______________________________ Talbert Cage, M.D.    SN/MEDQ  D:  12/08/2010  T:  12/08/2010  Job:  [id number]  Electronically Signed by Shanda Howells  on 02/02/2011 10:10:01 AM Electronically Signed by Talbert Cage M.D. on 02/04/2011 10:17:18 AM

## 2011-02-04 NOTE — Consult Note (Signed)
NAMEARMOUR, SILVER NO.:  [id number]  MEDICAL RECORD NO.:  [id number]           PATIENT TYPE:  I  LOCATION:  [id number]                         FACILITY:  [facility]  PHYSICIAN:  Earleen Newport, M.D.  DATE OF BIRTH:  1956-03-08  DATE OF CONSULTATION:  12/07/2010 DATE OF DISCHARGE:                                CONSULTATION   REQUESTING PHYSICIAN:  Orlie Dakin, MD  REASON FOR REQUEST:  Osteomyelitis.  HISTORY OF PRESENT ILLNESS:  [name] is a 55 year old right- handed Hispanic male who apparently had been treated for an osteomyelitis at [facility] for a period of 6 weeks with an IV antibiotics.  He tells me that his pain started back in September of last year and then in February and March she was diagnosed with a osteomyelitis and was treated with IV antibiotics empirically in the form of vancomycin over a 6 week period.  It seems that he felt better and after his antibiotics he did fair for a period of time, but he had chronic back pain.  He notes that this past weekend the pain became severely more acute.  He tells me that he had a very difficult dialysis on this past Friday with apparently 8 kg of fluid being withdrawn.  He notes that he felt ill and felt shaky and subsequently the pain became much more severe.  An MRI was performed demonstrates that an L2 and L3, he has severe osteomyelitis and diskitis with epidural abscess formation.  He has a moderately severe stenosis secondary to this process.  He is being admitted for further workup and I was asked by the family practice resident to see this patient for a neurosurgical opinion.  PAST MEDICAL HISTORY:  Notable for end-stage renal disease, diabetes mellitus and history of osteomyelitis as noted.  In addition to hyperlipidemia as his major medical problems, his current medication list was also reviewed, but not re-mentioned here.  His physical exam reveals that he is alert and he is  lying on his side in a reasonable comfort.  He is able to move his legs with good strength in iliopsoas, quad, tibialis anterior, and gastrocs.  His reflexes are absent in the patellae and the Achilles both.  Babinski's are downgoing. Sensation appears minimally depressed to pin and light touch sensation in the distal lower extremities.  His back is nontender to palpation or percussion.  He notes that the singular worst position tends to be sitting which aggravates the pain substantially.  There is no pain in the lumbosacral junction either to direct palpation or percussion. Again, the CBC and sed rate are not available at this time, but these have been ordered.  IMPRESSION:  The patient has evidence of severe degenerative change at the levels of L2 and L3 secondary to diskitis and osteomyelitis with kyphotic deformity and moderately severe stenosis.  This was reviewed on MRI performed today.  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  The patient will be admitted to the medical teaching service, and I have suggested apparently  has been arranged for him to have a needle biopsy of the psoas or the intervertebral disk space.  If this is diagnostic and depending on what it heels, I would determine what antibiotics he will be started on.  If the patient's condition does not improve significantly with some antibiotics, he may ultimately require surgical debridement and stabilization.  However, given his severe medical conditions, we would prefer certainly to treat him conservatively.  We will follow along.     Earleen Newport, M.D.     Drucilla Schmidt  D:  12/07/2010  T:  12/08/2010  Job:  [id number]  Electronically Signed by Kristeen Miss M.D. on 02/04/2011 05:00:50 PM

## 2011-02-09 ENCOUNTER — Ambulatory Visit: Payer: Self-pay | Admitting: Pain Medicine

## 2011-02-13 NOTE — Consult Note (Signed)
[name], [name]            ACCOUNT NO.:  [id number]  MEDICAL RECORD NO.:  [id number]           PATIENT TYPE:  I  LOCATION:  [id number]                         FACILITY:  [facility]  PHYSICIAN:  Maudie Flakes. Hassell Done, M.D.   DATE OF BIRTH:  [birth year]  DATE OF CONSULTATION:  12/08/2010 DATE OF DISCHARGE:                                CONSULTATION   REFERRING PHYSICIAN:  Family Air traffic controller.  CHIEF COMPLAINT:  End-stage renal disease.  ALLERGIES:  No known drug allergies.  HEMODIALYSIS PRESCRIPTION:  3 times a week.  EDW 62 kg.  4 hr.  AV fistula, upper left arm.  MEDICATIONS: 1. Pantoprazole 40 mg p.o. daily. 2. Oxycodone IR 10 mg p.o. daily. 3. Nitroglycerin 0.2 mg p.r.n. 4. Toprol 25 mg p.o. b.i.d. 5. Insulin 70/30, 10-12 units subcu b.i.d. 6. Fentanyl patch 80 mg every 3 days. 7. Celexa 20 mg p.o. daily. 8. Tylenol p.r.n. 9. Morphine 4 mg p.o. daily. 10.Rena-Vite daily. 11.PhosLo 667 mg 3 tabs 3 times a day with meals.  HISTORY OF PRESENT ILLNESS:  The patient had a recent 6-week hospitalization at [facility] for lumbar diskitis in January 2012, and a recent in April 2012 hospitalization for left femoral distal radial fracture, presents with 4 days of severe lumbar back pain and left lower extremity weakness, who was seen by Neurosurgery who recommended IR- guided biopsy, antibiotics, and an ID consultation.  The patient was admitted and he needs dialysis twice in the hospital, typically goes to the [facility].  PAST MEDICAL HISTORY:  Diskitis, end-stage renal disease, type 2 diabetes, CAD status post CABG in 2005, history of right upper extremity DVT, anemia, anxiety, depression, history of ESBL Klebsiella pneumoniae in February 2012.  SOCIAL HISTORY:  He lives in [place] for the last 28 years.  He is originally from [place].  He lives with his wife.  He is a Interior and spatial designer. His brother helps to care for him.  He has 6 children, 4 girls and  2 boys, all of them were healthy.  He does not smoke, drink alcohol, or use drugs.  His education is sixth grade which was in [place].  FAMILY HISTORY:  His mother died at 4 of unknown causes.  His father died at 44 of unknown causes.  He has 3 sisters, one died from unknown reasons and two brothers were healthy.  REVIEW OF SYSTEMS:  GENERAL:  No fevers, chills, weight loss, headache, bleeds, chest pain, shortness of breath, dyspnea on exertion, edema, cough, polyuria, polydipsia, frequency or urgency of urination, or nausea, vomiting, diarrhea, or abdominal pain.  He does report back pain.  PHYSICAL EXAMINATION:  VITAL SIGNS:  Temperature 97.5, pulse 71, respirations 18, blood pressure 199/82, O2 sat 98% on room air. GENERAL:  He is sleeping comfortably and hard to awake him. HEENT:  [place]/AT.  Extraocular movements intact.  Moist mucous membranes. NECK:  Supple without lymphadenopathy. CARDIOVASCULAR SYSTEM:  Heart regular rate and rhythm.  S2 and S1 normal without murmurs, rubs, or gallops. LUNGS:  Clear to auscultation without rales, rhonchi, or wheezes. SKIN:  No rash. GI:  Abdomen is soft, nontender, nondistended with  normal bowel sounds. EXTREMITIES:  Without edema.  His access is an AV fistula on the left upper arm. MUSCULOSKELETAL:  He has tender spine.  He is status post biopsy. NEUROLOGIC:  He is alert and oriented x3 upon awakening.  Cranial nerves II-XII intact but his answers are slowed to question, secondary possibly to narcotics.  LABS: WBC 9.5 with a hemoglobin of 13.1 and platelets 325.  Sodium of 129, K of 5.1, BUN 78, creatinine 4.95, glucose of 157.  Calcium is 9.8, phosphorus is 4.6. PTH in April 2012 was 59.  His UA showed 100 glucose, large blood, 100 protein, and red blood cells too numerous to count.  ASSESSMENT AND PLAN:  This is a 55 year old patient with end-stage renal disease who is in the hospital for treatment of diskitis.  1. End stage renal  disease.  He usually has underwent a Friday     dialysis.  He missed his last dialysis, so we will do dialysis     today.  I will set him up on a Tuesday, Thursday, and Saturday     schedule.  We will follow his electrolytes at dialysis.  We will     not give Epogen or vitamin D at this time.  He is on Venofer 100     per week but we will hold since he is not anemic and check his iron     stores this week. 2. Diskitis.  Per Primary Team and Neurosurgery, biopsy today with IR     already performed.  The patient today is sedated after his biopsy.     Tomorrow, we will ask him if he has been treated for urinary tract     infections or urinary symptoms which could be prostatitis, which     may be heeding the venous plexus around his lumbar     spine area. 3. Diabetes.  Insulin sliding scale. 4. Hypertension.  Metoprolol 25 b.i.d. with hydralazine p.r.n.     Marlana Salvage, MD   ______________________________ Maudie Flakes. Hassell Done, M.D.    RS/MEDQ  D:  12/08/2010  T:  12/09/2010  Job:  [id number]  Electronically Signed by Marlana Salvage  on 02/10/2011 11:40:02 AM Electronically Signed by Salem Senate M.D. on 02/13/2011 09:23:38 PM

## 2011-02-14 ENCOUNTER — Emergency Department (HOSPITAL_COMMUNITY)
Admission: EM | Admit: 2011-02-14 | Discharge: 2011-02-14 | Disposition: A | Discharge status: Home / Self Care | Payer: PRIVATE HEALTH INSURANCE | Attending: Emergency Medicine | Admitting: Emergency Medicine

## 2011-02-14 ENCOUNTER — Emergency Department (HOSPITAL_COMMUNITY): Payer: PRIVATE HEALTH INSURANCE

## 2011-02-14 DIAGNOSIS — I2510 Atherosclerotic heart disease of native coronary artery without angina pectoris: Secondary | ICD-10-CM | POA: Insufficient documentation

## 2011-02-14 DIAGNOSIS — Z992 Dependence on renal dialysis: Secondary | ICD-10-CM | POA: Insufficient documentation

## 2011-02-14 DIAGNOSIS — N186 End stage renal disease: Secondary | ICD-10-CM | POA: Insufficient documentation

## 2011-02-14 DIAGNOSIS — R059 Cough, unspecified: Secondary | ICD-10-CM | POA: Insufficient documentation

## 2011-02-14 DIAGNOSIS — I120 Hypertensive chronic kidney disease with stage 5 chronic kidney disease or end stage renal disease: Secondary | ICD-10-CM | POA: Insufficient documentation

## 2011-02-14 DIAGNOSIS — J40 Bronchitis, not specified as acute or chronic: Secondary | ICD-10-CM | POA: Insufficient documentation

## 2011-02-14 DIAGNOSIS — E785 Hyperlipidemia, unspecified: Secondary | ICD-10-CM | POA: Insufficient documentation

## 2011-02-14 DIAGNOSIS — R05 Cough: Secondary | ICD-10-CM | POA: Insufficient documentation

## 2011-02-14 DIAGNOSIS — I10 Essential (primary) hypertension: Secondary | ICD-10-CM | POA: Insufficient documentation

## 2011-02-14 LAB — POCT I-STAT, CHEM 8
BUN: 93 mg/dL — ABNORMAL HIGH (ref 6–23)
Chloride: 100 mEq/L (ref 96–112)
Creatinine, Ser: 6.3 mg/dL — ABNORMAL HIGH (ref 0.50–1.35)
Potassium: 4.8 mEq/L (ref 3.5–5.1)
Sodium: 134 mEq/L — ABNORMAL LOW (ref 135–145)
TCO2: 26 mmol/L (ref 0–100)

## 2011-02-22 ENCOUNTER — Ambulatory Visit: Payer: Self-pay | Admitting: Vascular Surgery

## 2011-02-27 ENCOUNTER — Emergency Department (HOSPITAL_COMMUNITY)
Admission: EM | Admit: 2011-02-27 | Discharge: 2011-02-27 | Disposition: A | Discharge status: Home / Self Care | Payer: PRIVATE HEALTH INSURANCE | Attending: Emergency Medicine | Admitting: Emergency Medicine

## 2011-02-27 DIAGNOSIS — I2510 Atherosclerotic heart disease of native coronary artery without angina pectoris: Secondary | ICD-10-CM | POA: Insufficient documentation

## 2011-02-27 DIAGNOSIS — E785 Hyperlipidemia, unspecified: Secondary | ICD-10-CM | POA: Insufficient documentation

## 2011-02-27 DIAGNOSIS — R197 Diarrhea, unspecified: Secondary | ICD-10-CM | POA: Insufficient documentation

## 2011-02-27 DIAGNOSIS — N186 End stage renal disease: Secondary | ICD-10-CM | POA: Insufficient documentation

## 2011-02-27 DIAGNOSIS — E860 Dehydration: Secondary | ICD-10-CM | POA: Insufficient documentation

## 2011-02-27 DIAGNOSIS — E119 Type 2 diabetes mellitus without complications: Secondary | ICD-10-CM | POA: Insufficient documentation

## 2011-02-27 DIAGNOSIS — R112 Nausea with vomiting, unspecified: Secondary | ICD-10-CM | POA: Insufficient documentation

## 2011-02-27 DIAGNOSIS — Z794 Long term (current) use of insulin: Secondary | ICD-10-CM | POA: Insufficient documentation

## 2011-02-27 DIAGNOSIS — I120 Hypertensive chronic kidney disease with stage 5 chronic kidney disease or end stage renal disease: Secondary | ICD-10-CM | POA: Insufficient documentation

## 2011-02-27 DIAGNOSIS — Z79899 Other long term (current) drug therapy: Secondary | ICD-10-CM | POA: Insufficient documentation

## 2011-02-27 LAB — CBC
HCT: 31.7 % — ABNORMAL LOW (ref 39.0–52.0)
Hemoglobin: 10.3 g/dL — ABNORMAL LOW (ref 13.0–17.0)
MCH: 29.9 pg (ref 26.0–34.0)
MCHC: 32.5 g/dL (ref 30.0–36.0)
Platelets: 249 10*3/uL (ref 150–400)
RBC: 3.45 MIL/uL — ABNORMAL LOW (ref 4.22–5.81)
WBC: 10.4 10*3/uL (ref 4.0–10.5)

## 2011-02-27 LAB — DIFFERENTIAL
Basophils Relative: 1 % (ref 0–1)
Lymphocytes Relative: 12 % (ref 12–46)
Monocytes Absolute: 0.5 10*3/uL (ref 0.1–1.0)
Monocytes Relative: 5 % (ref 3–12)
Neutro Abs: 8.4 10*3/uL — ABNORMAL HIGH (ref 1.7–7.7)
Neutrophils Relative %: 81 % — ABNORMAL HIGH (ref 43–77)

## 2011-02-27 LAB — COMPREHENSIVE METABOLIC PANEL
ALT: 14 U/L (ref 0–53)
AST: 25 U/L (ref 0–37)
Alkaline Phosphatase: 92 U/L (ref 39–117)
BUN: 35 mg/dL — ABNORMAL HIGH (ref 6–23)
CO2: 34 mEq/L — ABNORMAL HIGH (ref 19–32)
Calcium: 9.4 mg/dL (ref 8.4–10.5)
Chloride: 92 mEq/L — ABNORMAL LOW (ref 96–112)
GFR calc Af Amer: 18 mL/min — ABNORMAL LOW (ref >60)
GFR calc non Af Amer: 15 mL/min — ABNORMAL LOW (ref >60)
Glucose, Bld: 252 mg/dL — ABNORMAL HIGH (ref 70–99)
Potassium: 4 mEq/L (ref 3.5–5.1)
Sodium: 135 mEq/L (ref 135–145)
Total Bilirubin: 0.3 mg/dL (ref 0.3–1.2)

## 2011-05-04 ENCOUNTER — Ambulatory Visit: Payer: Self-pay | Admitting: Cardiovascular Disease

## 2011-05-04 HISTORY — PX: LEFT HEART CATH AND CORS/GRAFTS ANGIOGRAPHY: CATH118250

## 2011-05-11 ENCOUNTER — Ambulatory Visit: Payer: Self-pay | Admitting: Cardiovascular Disease

## 2011-05-19 ENCOUNTER — Other Ambulatory Visit: Payer: Self-pay | Admitting: Neurological Surgery

## 2011-05-19 DIAGNOSIS — M549 Dorsalgia, unspecified: Secondary | ICD-10-CM

## 2011-05-25 ENCOUNTER — Other Ambulatory Visit: Payer: Self-pay | Admitting: Neurological Surgery

## 2011-05-25 ENCOUNTER — Ambulatory Visit
Admission: RE | Admit: 2011-05-25 | Discharge: 2011-05-25 | Disposition: A | Discharge status: Home / Self Care | Payer: PRIVATE HEALTH INSURANCE | Source: Ambulatory Visit | Attending: Neurological Surgery | Admitting: Neurological Surgery

## 2011-05-25 DIAGNOSIS — M549 Dorsalgia, unspecified: Secondary | ICD-10-CM

## 2011-06-21 ENCOUNTER — Ambulatory Visit: Payer: Self-pay | Admitting: Vascular Surgery

## 2011-10-26 ENCOUNTER — Ambulatory Visit: Payer: Self-pay | Admitting: Vascular Surgery

## 2011-10-26 LAB — POTASSIUM: Potassium: 5 mmol/L (ref 3.5–5.1)

## 2011-10-28 ENCOUNTER — Encounter: Payer: Self-pay | Admitting: Physical Medicine & Rehabilitation

## 2012-08-29 ENCOUNTER — Ambulatory Visit: Payer: Self-pay | Admitting: Ophthalmology

## 2012-08-29 LAB — POTASSIUM: Potassium: 4.8 mmol/L (ref 3.5–5.1)

## 2012-09-06 ENCOUNTER — Ambulatory Visit: Payer: Self-pay | Admitting: Ophthalmology

## 2012-09-17 IMAGING — CR DG CHEST 1V PORT
1 series · 1 of 1 positions shown · non-contrast
Comparison: Chest 10/20/2010 at [DATE] a.m.

CLINICAL DATA: Respiratory distress.

PORTABLE CHEST - 1 VIEW

[AP]
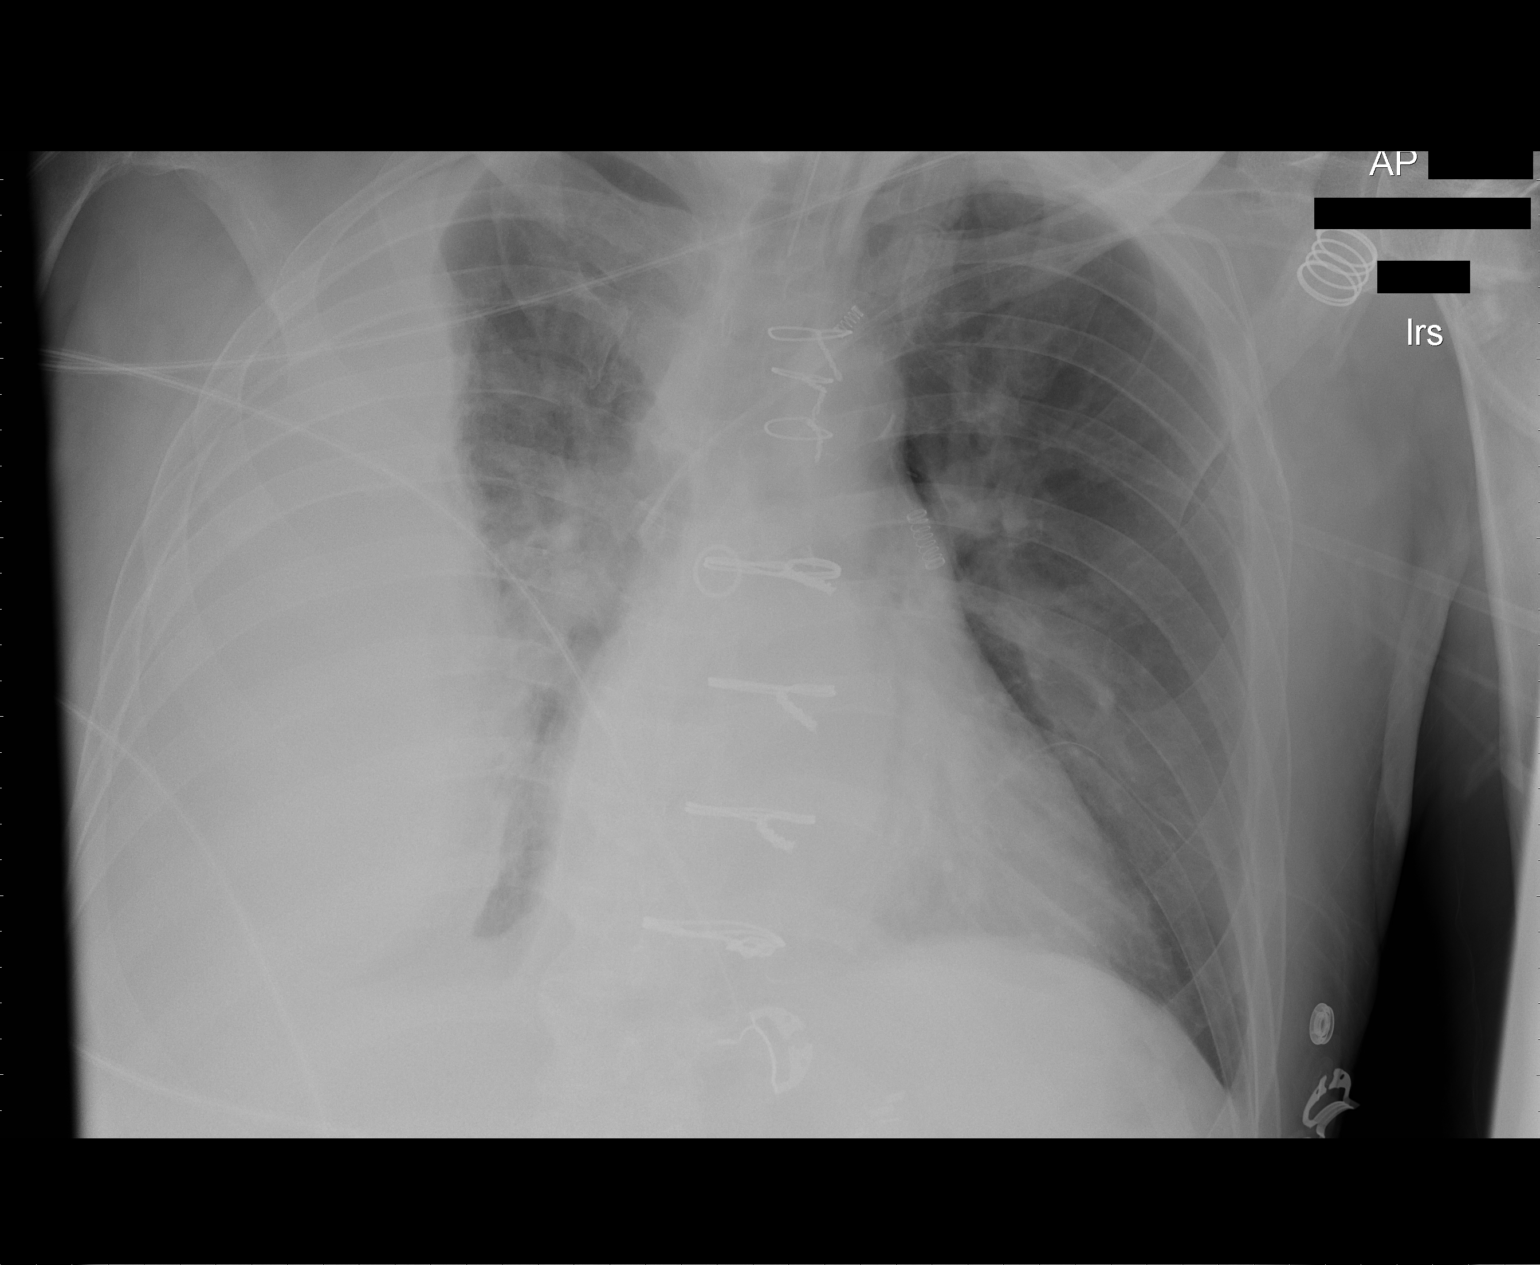

[1 of 1 positions shown; findings below may reference images not displayed]

FINDINGS: Support apparatus is unchanged.  Large area of dense
opacity in the right chest likely due to loculated pleural effusion
and airspace disease is unchanged.  Left lung remains clear.  Heart
size normal.
IMPRESSION: No change in a large, loculated right effusion and airspace
disease.

## 2012-09-19 IMAGING — CT CT CHEST W/O CM
4 of 5 series · 17 of 36 positions shown, 18 images · non-contrast
Comparison: Plain films of the chest 10/19/2010 and 10/21/2010.

CLINICAL DATA: Pleural effusion with loculation.  History of fall
with fractures.

CT CHEST WITHOUT CONTRAST
TECHNIQUE: Multidetector CT imaging of the chest was performed
following the standard protocol without IV contrast.

[Series 2: routine chest 5.0 st · axial · 0.77mm/px · z∈[+1015,+1230]mm · 4 of 73 slices shown, 5 images]
[im 15/73  mediastinal]
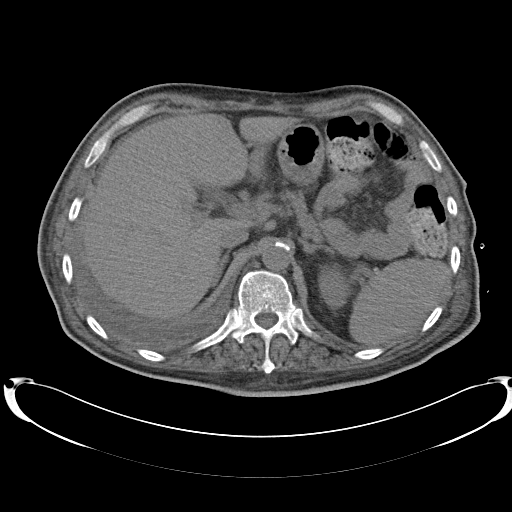
[im 15/73  lung]
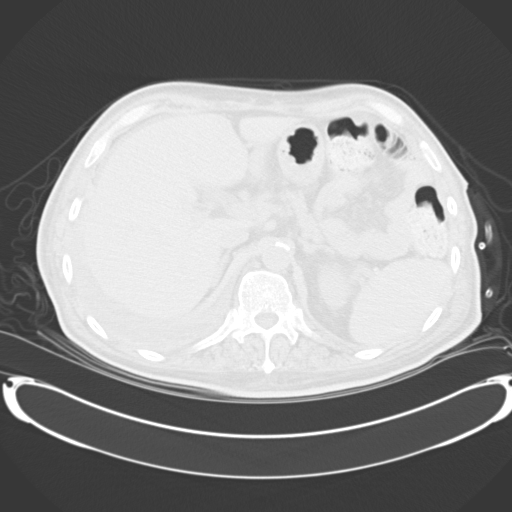
[im 29/73  lung]
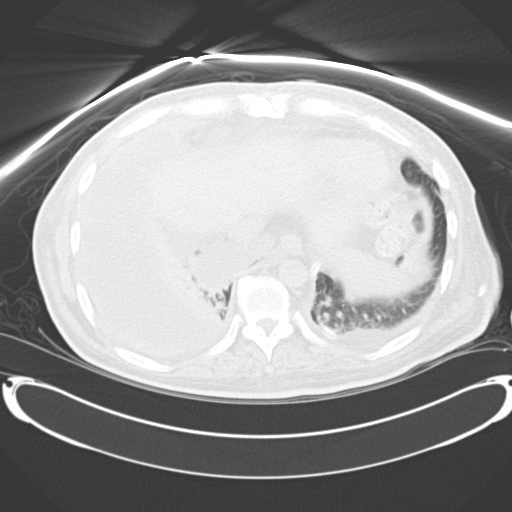
[im 44/73  lung]
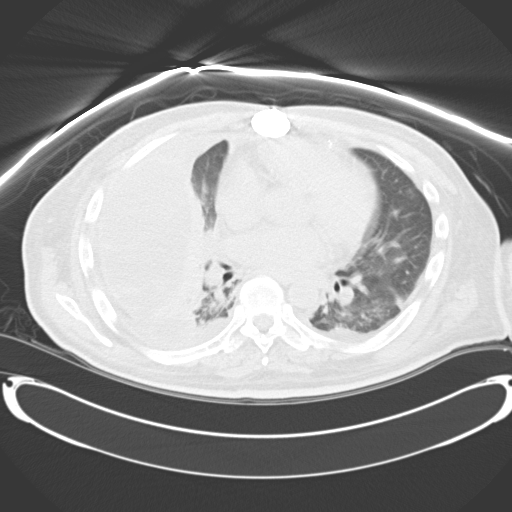
[im 58/73  lung]
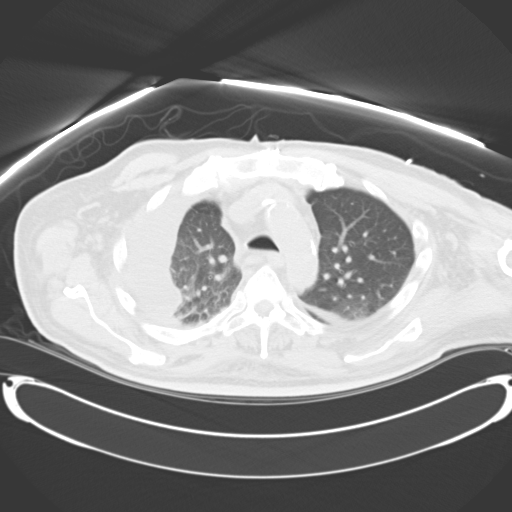

[Series 3: routine chest 5.0 lung · axial · 0.77mm/px · z∈[+1105,+1205]mm · 2 of 60 slices shown]
[im 20/60  lung]
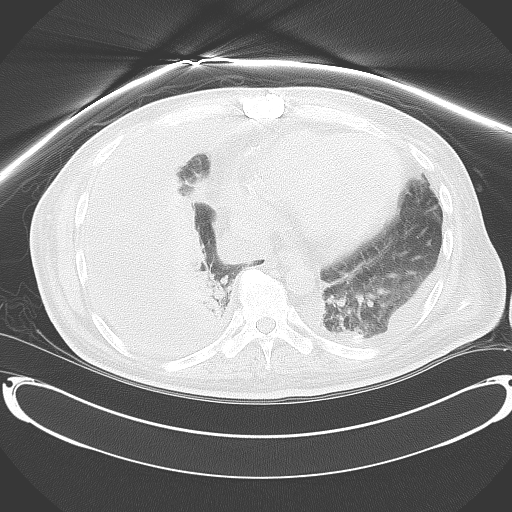
[im 40/60  lung]
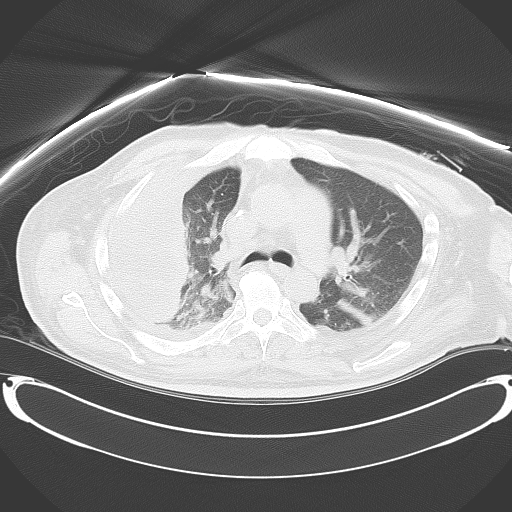

[Series 4: routine chest 2.0 st · axial · 0.77mm/px · z∈[+1091,+1290]mm · 8 of 243 slices shown (1 of 2)]
[im 29/243  lung]
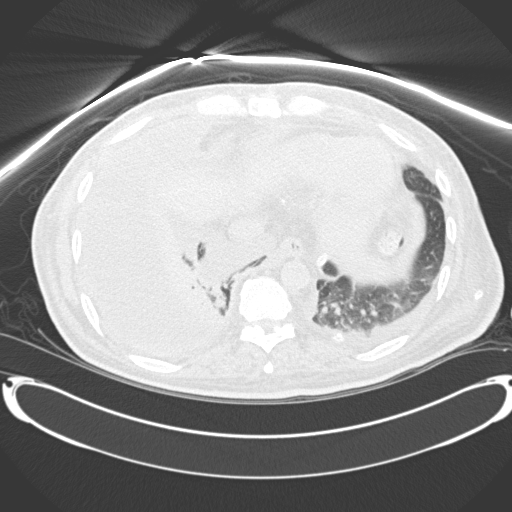
[im 57/243  lung]
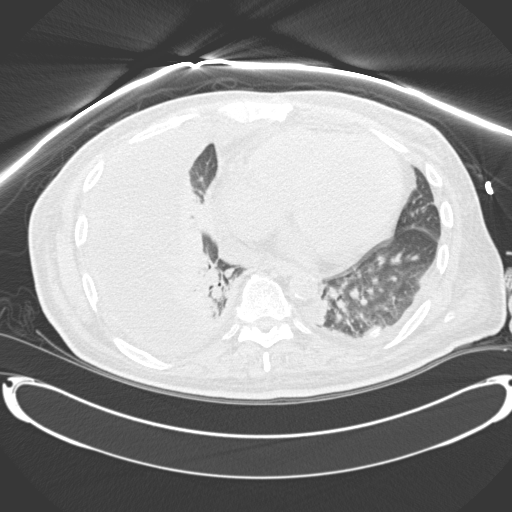
[im 86/243  lung]
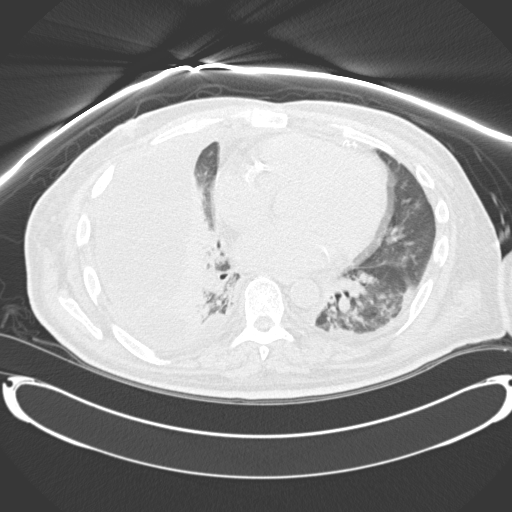
[im 114/243  lung]
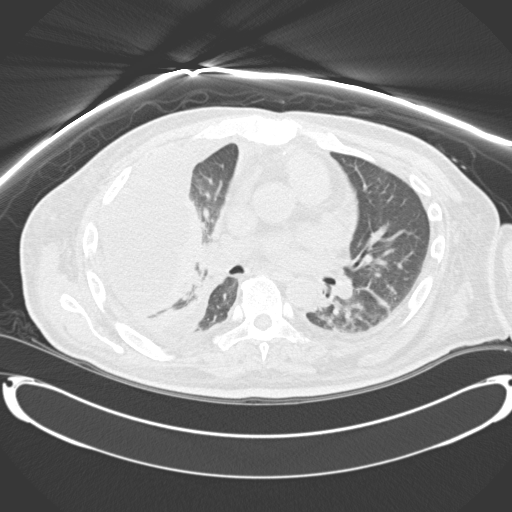
[im 143/243  lung]
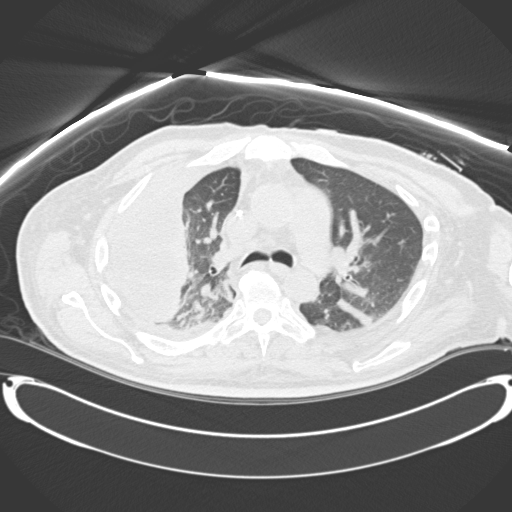
[im 171/243  lung]
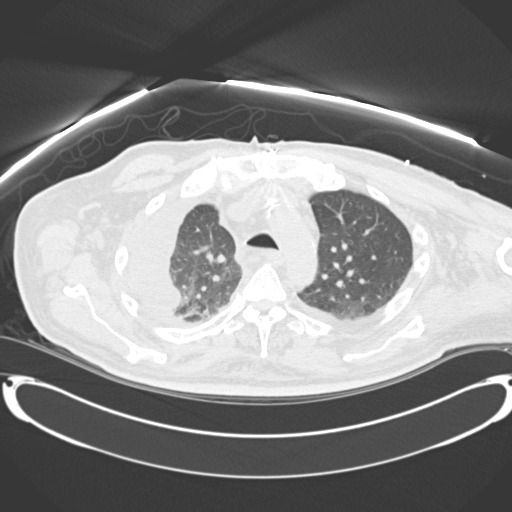
[im 200/243  lung]
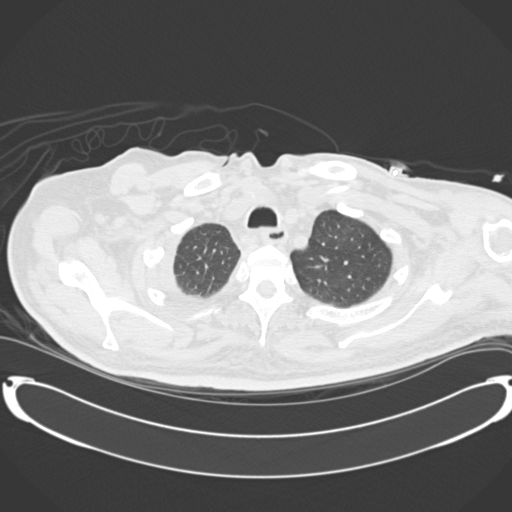
[im 228/243  lung]
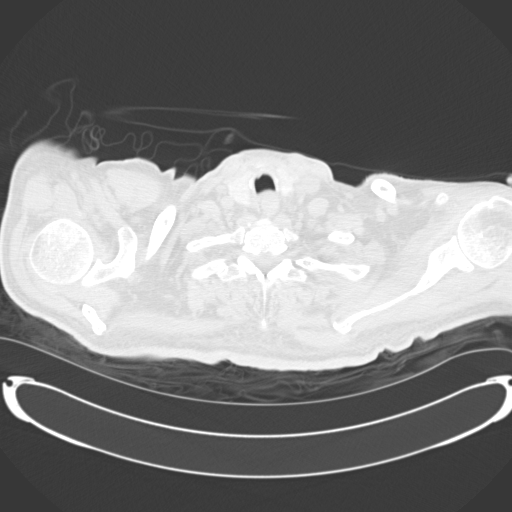

[Series 5: routine chest 2.0 st · coronal · 0.74mm/px · 3 of 109 slices shown (2 of 2)]
[im 22/109  lung]
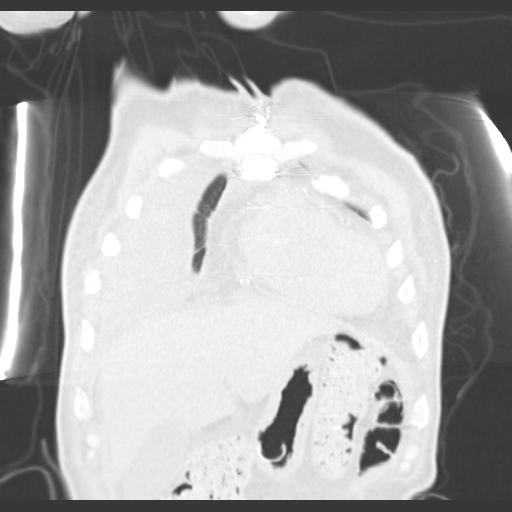
[im 44/109  lung]
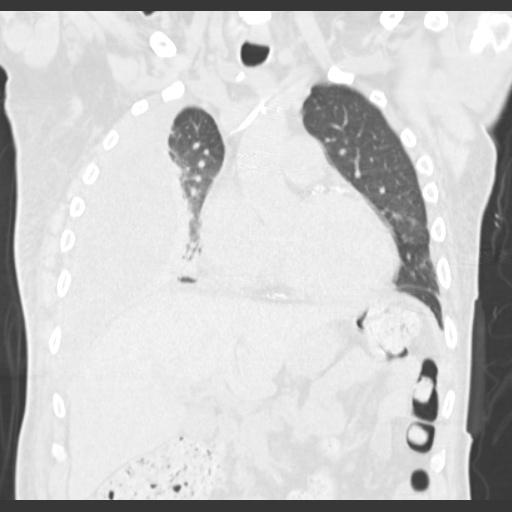
[im 65/109  lung]
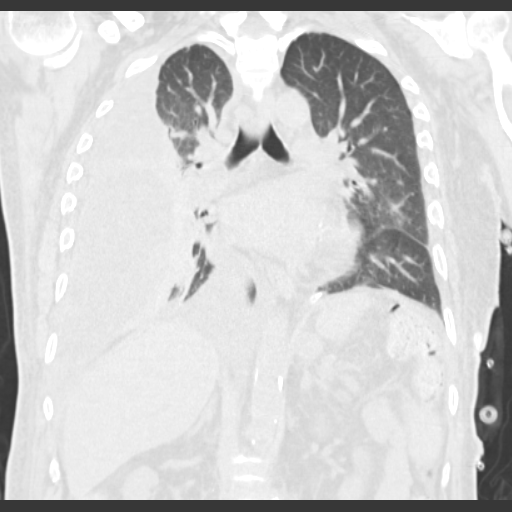

[17 of 36 positions shown; findings below may reference images not displayed]

FINDINGS: The patient has a very large right pleural effusion which
appears loculated.   The effusion appears heterogeneous and is
worrisome for hemothorax or possibly empyema although based on
given history hemothorax is favored.  There is a pleural drain in
place on the left.  The patient has only a tiny amount of pleural
fluid on the left.  Heart size is mildly enlarged.  No pericardial
effusion.  No pathologically enlarged lymph nodes.  The patient is
status post CABG.

Lungs demonstrate airspace opacity the right with appearance
consistent with compressive atelectasis.  Mild atelectasis is seen
dependently in the on the left.

Incidentally imaged upper abdomen shows a small amount of
perihepatic ascites.  Large stool burden is noted in the visualized
colon.  Imaged intra-abdominal contents otherwise unremarkable.  No
focal bony abnormality.
IMPRESSION: 1.  Large, complicated pleural effusion on the right is likely due
to hemathorax.  Empyema is also within the differential but felt
less likely.
2.  Tiny left pleural effusion with the drainage catheter in place.
3.  Mild cardiomegaly.
4.  Small amount of perihepatic ascites.

## 2014-11-22 NOTE — Op Note (Signed)
PATIENT NAME:  [name], [name] MR#:  [id number] DATE OF BIRTH:  11-27-1955  DATE OF PROCEDURE:  09/06/2012  PREOPERATIVE DIAGNOSES: 1.  Epiretinal membrane of the left eye.  2.  Retinal edema of the left eye. 3.  Visually significant cataract.   POSTOPERATIVE DIAGNOSIS:  1.  Epiretinal membrane of the left eye.  2.  Retinal edema of the left eye. 3.  Visually significant cataract.   PROCEDURES PERFORMED: 1.  Pars plana vitrectomy of the left eye.  2.  Internal limiting membrane peel of the left eye.  3.  Phacoemulsification intraocular lens insertion of the left eye.  ESTIMATED BLOOD LOSS: Less than 1 mL   PRIMARY SURGEON:  Garlan Fair, M.D.   ANESTHESIA: Retrobulbar block of the left eye with monitored anesthesia care.   COMPLICATIONS: None.   INDICATIONS FOR PROCEDURE: This is a patient who presented to my office with slowly decreasing vision of the left eye. The patient complained of slowly decreasing vision with an interference with his activities of daily living including reading. The patient noted significant glare of the left eye as well. Examination revealed a visually significant cataract of the left eye. The patient was also noted to have an epiretinal membrane with associated retinal edema. Vision visually was 20/60 minus. Risks, benefits, and alternatives of the above procedure were discussed and the patient wished to proceed.   DETAILS OF PROCEDURE:  After informed consent was obtained, the patient was brought into the operative suite at [facility]. The patient was placed in supine position, was given a small dose of propofol and a retrobulbar block was performed on the left eye by the primary surgeon without any complications. The left eye was prepped and draped in sterile manner. After lid speculum was inserted, a side-port wound was created at approximately 10:30. DisCoVisc was injected into the anterior chamber in order to maintain it. A  main corneal wound was created at 12:00 o'clock with a keratome blade. A cystotome was introduced in the eye and a continuous 360 degree anterior capsulorrhexis was created. The lens was hydrodissected using BSS on a 26-gauge cannula. The lens was rotated 90 degrees.  The phacoemulsification wand was introduced in the eye and the lens was sculpted and broken into 4 quadrants. The lens was removed without any the lens was removed without any complications. Remnant cortical material was removed using INA. DisCoVisc was injected into the capsular bag. A 22.5-diopter Technis ZCB00 lens, serial [id number], was injected into the capsular bag and rotated into position. A 10-0 nylon stitch was preplaced at the main corneal wound. INA was introduced and the DisCoVisc was removed. The 10-0 nylon stitch was tied into position and the knot was the knot was rotated into the cornea. The side-port wound was hydrated and the wounds were noted to be watertight. Attention was turned to the pars plana vitrectomy portion of the case.   A 25-gauge trocar was placed inferotemporally through displaced conjunctiva in an oblique fashion. The infusion cannula was turned on and inserted through the trocar and secured in position with Steri-Strips. Two more trocars were placed in a similar fashion superotemporally and superonasally. The vitreous cutter and light pipe were introduced in the eye and a core vitrectomy was performed. The vitreous face was confirmed elevated using suction. The vitreous face was then removed and the peripheral vitreous was trimmed for 360 degrees.  Indocyanine green was injected onto the posterior pole and removed within 30 seconds. Intraocular forceps were introduced  into the epiretinal membrane peel was performed followed by a 360-degree internal limiting membrane peel around the fovea without complication. A scleral depressed exam was performed for 360 degrees and no signs of any breaks, tears or retinal  detachment could be identified for 360 degrees.  A partial air-fluid exchange was performed and the trocars were removed. The wounds were noted to be airtight. 5 mg of dexamethasone was given into the inferior fornix. The lid speculum was removed and the eye was cleaned. TobraDex was placed in the eye and a patch and shield were placed over the eye. The patient was taken to postanesthesia care with instructions to remain head up.      ____________________________ Teresa Pelton. Starling Manns, MD mfa:ct D: 09/06/2012 08:34:31 ET T: 09/06/2012 08:55:35 ET JOB#: [id number]  cc: Teresa Pelton. Starling Manns, MD, <Dictator> Coralee Rud MD ELECTRONICALLY SIGNED 09/26/2012 6:46

## 2014-11-24 NOTE — Op Note (Signed)
PATIENT NAME:  [name], [name] MR#:  [id number] DATE OF BIRTH:  Aug 19, 1955  DATE OF PROCEDURE:  10/26/2011  PREOPERATIVE DIAGNOSES:  1. Complication AV dialysis fistula of the left arm.  2. End-stage renal disease requiring hemodialysis.   POSTOPERATIVE DIAGNOSES:  1. Complication AV dialysis fistula of the left arm.  2. End-stage renal disease requiring hemodialysis.   PROCEDURES PERFORMED:  1. Contrast injection, left arm AV fistula.  2. Percutaneous transluminal angioplasty to 9 mm of the left cephalic vein stent and confluence of the cephalic and subclavian veins.  3. Percutaneous transluminal angioplasty to 9 mm of the venous cannulation site.  4. Percutaneous transluminal angioplasty of the arterial cannulation site.   SURGEON: Katha Cabal, M.D.   SEDATION: Versed 3 mg plus fentanyl 50 mcg administered IV. Continuous ECG, pulse oximetry and cardiopulmonary monitoring is performed throughout the entire procedure by the interventional radiology nurse. Total sedation time was 45 minutes.   ACCESS: 6 French sheath, antegrade direction, left arm brachiocephalic fistula.   CONTRAST USED: Isovue 35 mL.   FLUOROSCOPY TIME:  2.7 minutes.   INDICATIONS: Mr. Athey is a 59 year old gentleman maintained on hemodialysis via a left arm brachiocephalic fistula. Recently he has been having worsening recirculation, decrease in KT/V and prolonged bleeding status post decannulation. The risks and benefits for contrast injection and treatment of stenoses were reviewed and all questions are answered. The patient has undergone placement of a Viabahn stent at the cephalic confluence in the past and he agrees to proceed.   DESCRIPTION OF PROCEDURE: The patient is taken to special procedures and placed in the supine position. After adequate sedation is achieved, the left arm is prepped and draped in a sterile fashion and extended palm upward. 1% lidocaine is infiltrated into the soft tissues  overlying the pulsatile fistula near the arterial anastomosis and access to the fistula is obtained with a micropuncture needle, microwire followed by microsheath is inserted, and J-wire followed by a 6 French sheath.   Hand injection of contrast is then utilized to demonstrate images of the fistula, as well as the central venous anatomy. After review of the images, 3000 units of heparin is given and allowed to circulate for several minutes.   A Magic torque wire is then advanced through the serial stenoses and an 8 x 6 balloon is used to angioplasty the cephalic confluence and the previously placed stent. Follow-up imaging demonstrates significant improvement with an inflation that was to 14 atmospheres; however, there is still moderate residual stenosis and therefore a 9 x 4 Rival balloon is advanced across the central lesion. This is then inflated. Two serial inflations are required to cover the extent of the lesion; both are to 16 atmospheres.   The balloon is then repositioned at the venous cannulation site. Follow-up imaging is made to locate the lesion and angioplasty is performed at the venous cannulation site to 14 atmospheres. The balloon is then repositioned to the initial stricture identified. This required three serial inflations, each to 16 atmospheres. Inflations were for approximately one minute.   Follow-up angiography demonstrates significant improvement. Palpation of the fistula demonstrates a continuous thrill with a significant decrease in the pulsatility of the fistula itself.   Wire is removed, a pursestring suture of 4-0 Monocryl is placed, and the sheath is removed. There are no immediate complications.   INTERPRETATION: Initial views of the fistula demonstrate there is a stricture just above the arterial cannulation site. The venous cannulation site again demonstrates a stricture just  above it and then there is a long segment stricture involving the stent that was previously  placed. Subclavian vein, innominate vein, and SVC otherwise appear free of stenoses. Cannulation sites      actually appear adequate. Distally near the arterial anastomosis the fistula is patent. Following angioplasty, as described above, there is significant improvement in all three locations with now improvement in the function of the fistula by physical exam.  ____________________________ Katha Cabal, MD ggs:slb D: 10/26/2011 08:57:35 ET T: 10/26/2011 11:23:46 ET JOB#: [id number]  cc: Katha Cabal, MD, <Dictator> Fonnie Jarvis. Ilene Qua, MD [place] Lilian Kapur, MD Murlean Iba, MD Katha Cabal MD ELECTRONICALLY SIGNED 11/03/2011 9:00

## 2015-02-25 ENCOUNTER — Ambulatory Visit (INDEPENDENT_AMBULATORY_CARE_PROVIDER_SITE_OTHER): Payer: Medicare Other | Admitting: Podiatry

## 2015-02-25 ENCOUNTER — Encounter: Payer: Self-pay | Admitting: Podiatry

## 2015-02-25 VITALS — BP 147/56 | HR 68 | Resp 16

## 2015-02-25 DIAGNOSIS — L89891 Pressure ulcer of other site, stage 1: Secondary | ICD-10-CM

## 2015-02-25 DIAGNOSIS — E1140 Type 2 diabetes mellitus with diabetic neuropathy, unspecified: Secondary | ICD-10-CM | POA: Diagnosis not present

## 2015-02-25 DIAGNOSIS — L97511 Non-pressure chronic ulcer of other part of right foot limited to breakdown of skin: Secondary | ICD-10-CM

## 2015-02-25 DIAGNOSIS — M2040 Other hammer toe(s) (acquired), unspecified foot: Secondary | ICD-10-CM

## 2015-02-25 DIAGNOSIS — E1149 Type 2 diabetes mellitus with other diabetic neurological complication: Secondary | ICD-10-CM

## 2015-02-25 MED ORDER — SILVER SULFADIAZINE 1 % EX CREA: 1.0000 "application " | TOPICAL_CREAM | Freq: Every day | CUTANEOUS | Status: DC

## 2015-02-25 NOTE — Patient Instructions (Signed)
Continue daily dressing changes. Monitor for any signs/symptoms of infection. Call the office immediately if any occur or go directly to the emergency room. Call with any questions/concerns.  

## 2015-02-25 NOTE — Progress Notes (Signed)
   Subjective:    Patient ID: [name], male    DOB: 1956-01-21, 59 y.o.   MRN: [id number]  HPI 59 year old male presents the office today with concerns of a wound/blister to the bottom of his right big toe has been ongoing for approximate 3 weeks. He says the area started as a blister and after the blister was removed to result of the wound. He definitely area has improved of the left couple weeks and is starting to dry up. He also states that this is the third time he had a wound of the same toe. He denies any drainage from the wound and denies any surrounding redness or any red streaks. No other complaints this time. Denies any systemic complaints as fevers, chills, nausea, vomiting.   Review of Systems  Skin:       Open sore Toe   All other systems reviewed and are negative.      Objective:   Physical Exam AAO 3, NAD DP/PT pulses 1/4 bilaterally, CRT less than 3 seconds Protective sensation decreased with Simms Weinstein monofilament, decreased vibratory sensation, Achilles tendon reflex intact. On the plantar aspect of the right hallux there is an ulceration which is a "L" shape. The wound is superficial with a granular wound base and the periwound is hyperkeratotic. There is no probing to bone, undermining, tunneling. There is no swelling erythema, ascending saline, fluctuance, crepitus, malodor. There is no drainage or purulence. No other open lesions or pre-ulcerative lesions identified bilaterally. Hammertoe contractures present.  No areas of tenderness to bilateral lower extremities. MMT 5/5, ROM WNL No pain with calf compression, swelling, warmth, erythema.       Assessment & Plan:  59 year old male right hallux plantar ulceration as a result of a blister; no signs of infection at this time. -Treatment options discussed including all alternatives, risks, and complications -Wound sharply debrided without , occasions. It was debrided to healthy, bleeding, granular  wound base. Silvadene was applied followed by dry sterile dressing. Silvadene was also prescribed. Continue daily dressing changes. -Monitor for any clinical signs or symptoms of infection and directed to call the office immediately should any occur or go to the ER. -Would also likely benefit from diabetic shoes. Paperwork was completed they for precertification. -Follow-up 2-3 weeks or sooner if any problems arise. In the meantime, encouraged to call the office with any questions, concerns, change in symptoms.   Celesta Gentile, DPM

## 2015-03-11 ENCOUNTER — Ambulatory Visit (INDEPENDENT_AMBULATORY_CARE_PROVIDER_SITE_OTHER): Payer: Medicare Other | Admitting: Podiatry

## 2015-03-11 DIAGNOSIS — E1140 Type 2 diabetes mellitus with diabetic neuropathy, unspecified: Secondary | ICD-10-CM

## 2015-03-11 DIAGNOSIS — E1149 Type 2 diabetes mellitus with other diabetic neurological complication: Secondary | ICD-10-CM

## 2015-03-11 DIAGNOSIS — L84 Corns and callosities: Secondary | ICD-10-CM

## 2015-03-11 DIAGNOSIS — L97511 Non-pressure chronic ulcer of other part of right foot limited to breakdown of skin: Secondary | ICD-10-CM | POA: Diagnosis not present

## 2015-03-11 NOTE — Progress Notes (Signed)
Patient ID: PRYCE FLY, male   DOB: 1955/10/03, 59 y.o.   MRN: [id number]  Subjective: 59 year old male presents the office they for followup evaluation of right plantar hallux ulceration of the results the posterior. He states that he believes the area is healed and is doing well. He's been applying Silvadene to the area daily followed by a dressing. He denies any surrounding redness or drainage from the area. Denies any systemic complaints as fevers, chills, nausea, vomiting. Denies any calf pain, chest pain, shortness of breath. No other complaints at this time in no acute changes since last appointment.  Objective: AAO x 3, NAD DP/PT pulses 1/4 bilaterally, CRT less than 3 seconds Protective sensation decreased with Simms Weinstein monofilament On the plantar aspect of the right hallux the hyperkeratotic lesion. Upon debridement of the lesion there is no underlying ulceration, drainage or other clinical signs of infection at this time. There is no swelling erythema, ascending cellulitis. No open lesions or pre-ulcer lesions identified bilaterally. No other areas of tenderness to bilateral lower extremity is. There is no pain with calf compression, swelling, warmth, erythema.  Assessment: 59 year old male with healed ulceration right plantar hallux  Plan: -Treatment options discussed including all alternatives, risks, and complications -Hyperkeratotic lesion sharply debrided without complications. -Recommended he continue to monitor the area closely. If the area were open back up or cause any problems to call the office immediately. -Discussed importance of daily foot inspection. -Follow-up 3 months or sooner if any problems arise. In the meantime, encouraged to call the office with any questions, concerns, change in symptoms.   Celesta Gentile, DPM

## 2015-03-11 NOTE — Patient Instructions (Signed)
If the wound opens back up or causes a problems, call the office for a follow-up  Diabetes and Foot Care Diabetes may cause you to have problems because of poor blood supply (circulation) to your feet and legs. This may cause the skin on your feet to become thinner, break easier, and heal more slowly. Your skin may become dry, and the skin may peel and crack. You may also have nerve damage in your legs and feet causing decreased feeling in them. You may not notice minor injuries to your feet that could lead to infections or more serious problems. Taking care of your feet is one of the most important things you can do for yourself.  HOME CARE INSTRUCTIONS  Wear shoes at all times, even in the house. Do not go barefoot. Bare feet are easily injured.  Check your feet daily for blisters, cuts, and redness. If you cannot see the bottom of your feet, use a mirror or ask someone for help.  Wash your feet with warm water (do not use hot water) and mild soap. Then pat your feet and the areas between your toes until they are completely dry. Do not soak your feet as this can dry your skin.  Apply a moisturizing lotion or petroleum jelly (that does not contain alcohol and is unscented) to the skin on your feet and to dry, brittle toenails. Do not apply lotion between your toes.  Trim your toenails straight across. Do not dig under them or around the cuticle. File the edges of your nails with an emery board or nail file.  Do not cut corns or calluses or try to remove them with medicine.  Wear clean socks or stockings every day. Make sure they are not too tight. Do not wear knee-high stockings since they may decrease blood flow to your legs.  Wear shoes that fit properly and have enough cushioning. To break in new shoes, wear them for just a few hours a day. This prevents you from injuring your feet. Always look in your shoes before you put them on to be sure there are no objects inside.  Do not cross your  legs. This may decrease the blood flow to your feet.  If you find a minor scrape, cut, or break in the skin on your feet, keep it and the skin around it clean and dry. These areas may be cleansed with mild soap and water. Do not cleanse the area with peroxide, alcohol, or iodine.  When you remove an adhesive bandage, be sure not to damage the skin around it.  If you have a wound, look at it several times a day to make sure it is healing.  Do not use heating pads or hot water bottles. They may burn your skin. If you have lost feeling in your feet or legs, you may not know it is happening until it is too late.  Make sure your health care provider performs a complete foot exam at least annually or more often if you have foot problems. Report any cuts, sores, or bruises to your health care provider immediately. SEEK MEDICAL CARE IF:   You have an injury that is not healing.  You have cuts or breaks in the skin.  You have an ingrown nail.  You notice redness on your legs or feet.  You feel burning or tingling in your legs or feet.  You have pain or cramps in your legs and feet.  Your legs or feet are numb.  Your feet always feel cold. SEEK IMMEDIATE MEDICAL CARE IF:   There is increasing redness, swelling, or pain in or around a wound.  There is a red line that goes up your leg.  Pus is coming from a wound.  You develop a fever or as directed by your health care provider.  You notice a bad smell coming from an ulcer or wound. Document Released: 07/16/2000 Document Revised: 03/21/2013 Document Reviewed: 12/26/2012 [facility] Patient Information 2015 [place], [place]. This information is not intended to replace advice given to you by your health care provider. Make sure you discuss any questions you have with your health care provider.

## 2015-03-20 ENCOUNTER — Telehealth: Payer: Self-pay | Admitting: *Deleted

## 2015-03-20 NOTE — Telephone Encounter (Signed)
03/20/15- CALLED PT AND MADE APPT FOR DIABETIC SHOE CASTING ON 04/10/15 WITH BETHA. [place]

## 2015-03-20 NOTE — Telephone Encounter (Signed)
[facility] [phone] ext- 2012 Patient came into office and stated he had recently been seen in our office and wants new diabetic.  Please contact her about setting this up.

## 2015-03-20 NOTE — Telephone Encounter (Signed)
Tammy-could you please get her set up for an appointment.

## 2015-04-10 ENCOUNTER — Ambulatory Visit: Payer: Medicare Other

## 2015-05-10 DIAGNOSIS — Z940 Kidney transplant status: Secondary | ICD-10-CM

## 2015-05-10 HISTORY — PX: KIDNEY TRANSPLANT: SHX239

## 2015-05-10 HISTORY — DX: Kidney transplant status: Z94.0

## 2015-06-12 ENCOUNTER — Ambulatory Visit (INDEPENDENT_AMBULATORY_CARE_PROVIDER_SITE_OTHER): Payer: Medicare Other | Admitting: Podiatry

## 2015-06-12 ENCOUNTER — Encounter: Payer: Self-pay | Admitting: Podiatry

## 2015-06-12 VITALS — BP 109/42 | HR 59 | Resp 18

## 2015-06-12 DIAGNOSIS — M2042 Other hammer toe(s) (acquired), left foot: Secondary | ICD-10-CM

## 2015-06-12 DIAGNOSIS — L97511 Non-pressure chronic ulcer of other part of right foot limited to breakdown of skin: Secondary | ICD-10-CM | POA: Diagnosis not present

## 2015-06-12 DIAGNOSIS — M79676 Pain in unspecified toe(s): Secondary | ICD-10-CM

## 2015-06-12 DIAGNOSIS — L84 Corns and callosities: Secondary | ICD-10-CM

## 2015-06-12 DIAGNOSIS — M2041 Other hammer toe(s) (acquired), right foot: Secondary | ICD-10-CM | POA: Diagnosis not present

## 2015-06-12 DIAGNOSIS — E1149 Type 2 diabetes mellitus with other diabetic neurological complication: Secondary | ICD-10-CM | POA: Diagnosis not present

## 2015-06-12 DIAGNOSIS — B351 Tinea unguium: Secondary | ICD-10-CM

## 2015-06-12 DIAGNOSIS — M2040 Other hammer toe(s) (acquired), unspecified foot: Secondary | ICD-10-CM

## 2015-06-12 NOTE — Patient Instructions (Signed)

## 2015-06-12 NOTE — Progress Notes (Signed)
Patient ID: LEROY LARISCY, male   DOB: Dec 07, 1955, 59 y.o.   MRN: [id number]  Subjective: 59 year old male presents the office they for followup evaluation of right plantar hallux ulceration which he states is "doing better". He also presents today to pick up diabetic shoes. 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 He denies any surrounding redness or drainage from the area. Denies any systemic complaints as fevers, chills, nausea, vomiting. Denies any calf pain, chest pain, shortness of breath. No other complaints at this time in no acute changes since last appointment.  Objective: AAO x 3, NAD DP/PT pulses 1/4 bilaterally, CRT less than 3 seconds Protective sensation decreased with Simms Weinstein monofilament On the plantar aspect of the right hallux the hyperkeratotic lesion. Upon debridement of the lesion there continues to not be an ulceration or any signs of drainage, or other signs of infection. The area does appear to be pre-ulcerative however. There are no other open lesions or pre-ulcerative lesions identified bilaterally. Nails are hypertrophic, dystrophic, brittle, discolored, elongated 10. There is no swelling erythema or drainage. There is tenderness to palpation overlying nails 1-5 bilaterally. No other areas tenderness to bilateral lower extremities. No overlying edema, erythema, increase in warmth. There is no pain with calf compression, swelling, warmth, erythema.  Assessment: 59 year old male with pre-ulcerative lesion right hallux, symptomatic onychomycosis  Plan: -Treatment options discussed including all alternatives, risks, and complications -Hyperkeratotic lesion sharply debrided without complications. -Nail sharply debrided without complication/bleeding 10. -Patient presents for diabetic shoe pick up, shoes are tried on for good fit.  Patient received 1 Pair Apex V950M Athletic Walker Black Double Velcro Closure in men's 9 wide and 3 pairs custom molded diabetic  inserts.  Verbal and written break in and wear instructions give -Continue to monitor his foot closely for any further skin breakdown. There is any problems to call the office immediately and not wait until his next appointment. -Discussed importance of daily foot inspection. -Follow-up 3 months or sooner if any problems arise. In the meantime, encouraged to call the office with any questions, concerns, change in symptoms.   Celesta Gentile, DPM

## 2015-06-15 ENCOUNTER — Encounter: Payer: Self-pay | Admitting: Podiatry

## 2015-07-22 DIAGNOSIS — I480 Paroxysmal atrial fibrillation: Secondary | ICD-10-CM | POA: Insufficient documentation

## 2015-08-19 ENCOUNTER — Encounter: Payer: Self-pay | Admitting: Podiatry

## 2015-08-19 ENCOUNTER — Ambulatory Visit (INDEPENDENT_AMBULATORY_CARE_PROVIDER_SITE_OTHER): Payer: Medicare Other | Admitting: Podiatry

## 2015-08-19 VITALS — BP 139/43 | HR 61 | Resp 18

## 2015-08-19 DIAGNOSIS — B351 Tinea unguium: Secondary | ICD-10-CM

## 2015-08-19 DIAGNOSIS — M79676 Pain in unspecified toe(s): Secondary | ICD-10-CM | POA: Diagnosis not present

## 2015-08-19 DIAGNOSIS — L84 Corns and callosities: Secondary | ICD-10-CM | POA: Diagnosis not present

## 2015-08-19 DIAGNOSIS — E1149 Type 2 diabetes mellitus with other diabetic neurological complication: Secondary | ICD-10-CM | POA: Diagnosis not present

## 2015-08-19 NOTE — Progress Notes (Signed)
Patient ID: [name], male   DOB: March 21, 1956, 60 y.o.   MRN: [id number]  Subjective: 60 y.o. returns the office today for painful, elongated, thickened toenails which he cannot trim himself. Denies any redness or drainage around the nails. No new ulcers and he states the one on the bottom of the hallux has done well and not reopened. Denies any acute changes since last appointment and no new complaints today. Denies any systemic complaints such as fevers, chills, nausea, vomiting.   Objective: AAO 3, NAD DP/PT pulses palpable, CRT less than 3 seconds Protective sensation decreased with Simms Weinstein monofilament Nails hypertrophic, dystrophic, elongated, brittle, discolored 10. There is tenderness overlying the nails 1-5 bilaterally. There is no surrounding erythema or drainage along the nail sites. Hyperkerotic leison plantar right hallux without any underlying ulceration, drainage, or signs of infection.  No open lesions or pre-ulcerative lesions are identified. No other areas of tenderness bilateral lower extremities. No overlying edema, erythema, increased warmth. No pain with calf compression, swelling, warmth, erythema.  Assessment: Patient presents with symptomatic onychomycosis; pre-ulcerative callus  Plan: -Treatment options including alternatives, risks, complications were discussed -Nails sharply debrided 10 without complication/bleeding. -Pre-ulcerative callus debrided x 1 without complication or bleeing -Discussed daily foot inspection. If there are any changes, to call the office immediately.  -Follow-up in 6 months at his request, or sooner if any problems are to arise. In the meantime, encouraged to call the office with any questions, concerns, changes symptoms.  Celesta Gentile, DPM

## 2015-11-11 DIAGNOSIS — D849 Immunodeficiency, unspecified: Secondary | ICD-10-CM | POA: Insufficient documentation

## 2015-11-11 DIAGNOSIS — D899 Disorder involving the immune mechanism, unspecified: Secondary | ICD-10-CM

## 2016-02-17 ENCOUNTER — Ambulatory Visit: Payer: Medicare Other | Admitting: Podiatry

## 2016-02-17 ENCOUNTER — Encounter: Payer: Self-pay | Admitting: Podiatry

## 2016-08-20 ENCOUNTER — Encounter (INDEPENDENT_AMBULATORY_CARE_PROVIDER_SITE_OTHER): Payer: Self-pay | Admitting: Vascular Surgery

## 2016-08-20 ENCOUNTER — Ambulatory Visit (INDEPENDENT_AMBULATORY_CARE_PROVIDER_SITE_OTHER): Payer: Medicare Other

## 2016-08-20 ENCOUNTER — Ambulatory Visit (INDEPENDENT_AMBULATORY_CARE_PROVIDER_SITE_OTHER): Payer: Medicare Other | Admitting: Vascular Surgery

## 2016-08-20 ENCOUNTER — Other Ambulatory Visit (INDEPENDENT_AMBULATORY_CARE_PROVIDER_SITE_OTHER): Payer: Self-pay | Admitting: Vascular Surgery

## 2016-08-20 VITALS — BP 101/55 | HR 58 | Resp 16 | Ht 67.0 in | Wt 165.0 lb

## 2016-08-20 DIAGNOSIS — I10 Essential (primary) hypertension: Secondary | ICD-10-CM | POA: Diagnosis not present

## 2016-08-20 DIAGNOSIS — N189 Chronic kidney disease, unspecified: Secondary | ICD-10-CM | POA: Diagnosis not present

## 2016-08-20 DIAGNOSIS — E1159 Type 2 diabetes mellitus with other circulatory complications: Secondary | ICD-10-CM

## 2016-08-20 DIAGNOSIS — T829XXA Unspecified complication of cardiac and vascular prosthetic device, implant and graft, initial encounter: Secondary | ICD-10-CM

## 2016-08-20 DIAGNOSIS — E1122 Type 2 diabetes mellitus with diabetic chronic kidney disease: Secondary | ICD-10-CM | POA: Insufficient documentation

## 2016-08-20 DIAGNOSIS — E119 Type 2 diabetes mellitus without complications: Secondary | ICD-10-CM | POA: Insufficient documentation

## 2016-08-20 DIAGNOSIS — Z940 Kidney transplant status: Secondary | ICD-10-CM | POA: Diagnosis not present

## 2016-08-20 NOTE — Progress Notes (Signed)
Subjective:    Patient ID: [name], male    DOB: [birth year], 61 y.o.   MRN: [id number] Chief Complaint  Patient presents with  . Follow-up   Patient presents for a yearly HDA follow up. He is s/p a kidney transplant about 18 months ago. He is not using his access. The patient underwent a duplex ultrasound of the AV access which was notable for a patent fistula without any significant hemodynamic stenosis. The patient denies any fistula skin breakdown, pain, edema, pallor or ulceration of the arm / hand.      Review of Systems  Constitutional: Negative.   HENT: Negative.   Eyes: Negative.   Respiratory: Negative.   Cardiovascular: Negative.   Gastrointestinal: Negative.   Endocrine: Negative.   Genitourinary: Negative.   Musculoskeletal: Negative.   Skin: Negative.   Allergic/Immunologic: Negative.   Neurological: Negative.   Hematological: Negative.   Psychiatric/Behavioral: Negative.       Objective:   Physical Exam  Constitutional: He is oriented to person, place, and time. He appears well-developed and well-nourished.  HENT:  Head: Normocephalic and atraumatic.  Right Ear: External ear normal.  Left Ear: External ear normal.  Eyes: Conjunctivae and EOM are normal. Pupils are equal, round, and reactive to light.  Neck: Normal range of motion.  Cardiovascular: Normal rate, regular rhythm, normal heart sounds and intact distal pulses.   Pulses:      Radial pulses are 2+ on the right side, and 2+ on the left side.       Dorsalis pedis pulses are 2+ on the right side, and 2+ on the left side.       Posterior tibial pulses are 2+ on the right side, and 2+ on the left side.  Pulmonary/Chest: Effort normal and breath sounds normal.  Abdominal: Soft. Bowel sounds are normal.  Musculoskeletal: Normal range of motion. He exhibits no edema.  Neurological: He is alert and oriented to person, place, and time.  Skin: Skin is warm and dry.  Psychiatric: He has a normal  mood and affect. His behavior is normal. Judgment and thought content normal.   BP (!) 101/55   Pulse (!) 58   Resp 16   Wt 165 lb (74.8 kg)   No past medical history on file.  Social History   Social History  . Marital status: Married    Spouse name: N/A  . Number of children: N/A  . Years of education: N/A   Occupational History  . Not on file.   Social History Main Topics  . Smoking status: Never Smoker  . Smokeless tobacco: Never Used  . Alcohol use Not on file  . Drug use: Unknown  . Sexual activity: Not on file   Other Topics Concern  . Not on file   Social History Narrative  . No narrative on file    No past surgical history on file.  No family history on file.  No Known Allergies     Assessment & Plan:  Patient presents for a yearly HDA follow up. He is s/p a kidney transplant about 18 months ago. He is not using his access. The patient underwent a duplex ultrasound of the AV access which was notable for a patent fistula without any significant hemodynamic stenosis. The patient denies any fistula skin breakdown, pain, edema, pallor or ulceration of the arm / hand.   1. Kidney transplant recipient - Stable Transplanted kidney working well. Not using fistula however is patent on  duplex. Good bruit and thrill on exam. Follow up in one year for surveillance duplex.  - VAS US DUPLEX DIALYSIS ACCESS (AVF,AVG); Future  2. Type 2 diabetes mellitus with other circulatory complication, unspecified long term insulin use status (HCC) - Stable Encouraged good control as its slows the progression of atherosclerotic disease.  3. Essential hypertension - Stable Encouraged good control as its slows the progression of atherosclerotic disease.  Current Outpatient Prescriptions on File Prior to Visit  Medication Sig Dispense Refill  . aspirin 325 MG tablet Take 325 mg by mouth.    [name] atorvastatin (LIPITOR) 40 MG tablet     . isosorbide mononitrate (IMDUR) 30 MG 24 hr  tablet     . LANTUS SOLOSTAR 100 UNIT/ML Solostar Pen     . BIOFLAVONOID PRODUCTS PO Take by mouth.    . enalapril (VASOTEC) 5 MG tablet     . silver sulfADIAZINE (SILVADENE) 1 % cream Apply 1 application topically daily. (Patient not taking: Reported on 08/20/2016) 50 g 0   No current facility-administered medications on file prior to visit.     There are no Patient Instructions on file for this visit. No Follow-up on file.   Aleiya Rye A Zafiro Routson, PA-C

## 2017-01-26 ENCOUNTER — Encounter: Payer: Self-pay | Admitting: *Deleted

## 2017-02-03 ENCOUNTER — Ambulatory Visit: Payer: Medicare Other | Admitting: Anesthesiology

## 2017-02-03 ENCOUNTER — Encounter
Admission: RE | Disposition: A | Discharge status: Home / Self Care | Payer: Self-pay | Source: Ambulatory Visit | Attending: Ophthalmology

## 2017-02-03 ENCOUNTER — Encounter: Payer: Self-pay | Admitting: *Deleted

## 2017-02-03 ENCOUNTER — Ambulatory Visit
Admission: RE | Admit: 2017-02-03 | Discharge: 2017-02-03 | Disposition: A | Discharge status: Home / Self Care | Payer: Medicare Other | Source: Ambulatory Visit | Attending: Ophthalmology | Admitting: Ophthalmology

## 2017-02-03 DIAGNOSIS — E7800 Pure hypercholesterolemia, unspecified: Secondary | ICD-10-CM | POA: Diagnosis not present

## 2017-02-03 DIAGNOSIS — I2510 Atherosclerotic heart disease of native coronary artery without angina pectoris: Secondary | ICD-10-CM | POA: Diagnosis not present

## 2017-02-03 DIAGNOSIS — Z940 Kidney transplant status: Secondary | ICD-10-CM | POA: Insufficient documentation

## 2017-02-03 DIAGNOSIS — E119 Type 2 diabetes mellitus without complications: Secondary | ICD-10-CM | POA: Diagnosis not present

## 2017-02-03 DIAGNOSIS — H2511 Age-related nuclear cataract, right eye: Secondary | ICD-10-CM | POA: Insufficient documentation

## 2017-02-03 DIAGNOSIS — M1990 Unspecified osteoarthritis, unspecified site: Secondary | ICD-10-CM | POA: Insufficient documentation

## 2017-02-03 DIAGNOSIS — I10 Essential (primary) hypertension: Secondary | ICD-10-CM | POA: Diagnosis not present

## 2017-02-03 DIAGNOSIS — Z951 Presence of aortocoronary bypass graft: Secondary | ICD-10-CM | POA: Insufficient documentation

## 2017-02-03 DIAGNOSIS — I252 Old myocardial infarction: Secondary | ICD-10-CM | POA: Diagnosis not present

## 2017-02-03 DIAGNOSIS — Z87891 Personal history of nicotine dependence: Secondary | ICD-10-CM | POA: Insufficient documentation

## 2017-02-03 HISTORY — PX: CATARACT EXTRACTION W/PHACO: SHX586

## 2017-02-03 HISTORY — DX: Atherosclerotic heart disease of native coronary artery without angina pectoris: I25.10

## 2017-02-03 HISTORY — DX: Acute myocardial infarction, unspecified: I21.9

## 2017-02-03 HISTORY — DX: Unspecified osteoarthritis, unspecified site: M19.90

## 2017-02-03 HISTORY — DX: Type 2 diabetes mellitus without complications: E11.9

## 2017-02-03 HISTORY — DX: Chronic kidney disease, unspecified: N18.9

## 2017-02-03 LAB — GLUCOSE, CAPILLARY: Glucose-Capillary: 206 mg/dL — ABNORMAL HIGH (ref 65–99)

## 2017-02-03 SURGERY — PHACOEMULSIFICATION, CATARACT, WITH IOL INSERTION
Anesthesia: Monitor Anesthesia Care | Site: Eye | Laterality: Right | Wound class: Clean

## 2017-02-03 MED ORDER — SODIUM CHLORIDE 0.9 % IV SOLN
INTRAVENOUS | Status: DC
  Administered 2017-02-03 (×2): via INTRAVENOUS

## 2017-02-03 MED ORDER — PHENYLEPHRINE HCL 10 MG/ML IJ SOLN
INTRAMUSCULAR | Status: AC
  Filled 2017-02-03: qty 1

## 2017-02-03 MED ORDER — POVIDONE-IODINE 5 % OP SOLN
OPHTHALMIC | Status: DC | PRN
  Administered 2017-02-03: 1 via OPHTHALMIC

## 2017-02-03 MED ORDER — LIDOCAINE HCL (PF) 4 % IJ SOLN
INTRAOCULAR | Status: DC | PRN
  Administered 2017-02-03: 4 mL via OPHTHALMIC

## 2017-02-03 MED ORDER — MIDAZOLAM HCL 5 MG/5ML IJ SOLN
INTRAMUSCULAR | Status: DC | PRN
  Administered 2017-02-03: 1 mg via INTRAVENOUS

## 2017-02-03 MED ORDER — EPINEPHRINE PF 1 MG/ML IJ SOLN
INTRAOCULAR | Status: DC | PRN
  Administered 2017-02-03: 08:00:00 via OPHTHALMIC

## 2017-02-03 MED ORDER — NA HYALUR & NA CHOND-NA HYALUR 0.4-0.35 ML IO KIT
PACK | INTRAOCULAR | Status: DC | PRN
  Administered 2017-02-03: .35 mL via INTRAOCULAR

## 2017-02-03 MED ORDER — ARMC OPHTHALMIC DILATING DROPS
1.0000 "application " | OPHTHALMIC | Status: AC
  Administered 2017-02-03 (×3): 1 via OPHTHALMIC

## 2017-02-03 MED ORDER — POVIDONE-IODINE 5 % OP SOLN
OPHTHALMIC | Status: AC
  Filled 2017-02-03: qty 30

## 2017-02-03 MED ORDER — ARMC OPHTHALMIC DILATING DROPS
OPHTHALMIC | Status: DC
Start: 2017-02-03 — End: 2017-02-03
  Filled 2017-02-03: qty 0.4

## 2017-02-03 MED ORDER — LIDOCAINE HCL (PF) 4 % IJ SOLN
INTRAMUSCULAR | Status: AC
  Filled 2017-02-03: qty 5

## 2017-02-03 MED ORDER — MOXIFLOXACIN HCL 0.5 % OP SOLN
OPHTHALMIC | Status: AC
  Filled 2017-02-03: qty 3

## 2017-02-03 MED ORDER — MOXIFLOXACIN HCL 0.5 % OP SOLN
1.0000 [drp] | OPHTHALMIC | Status: AC
  Administered 2017-02-03 (×2): 1 [drp] via OPHTHALMIC

## 2017-02-03 MED ORDER — NA HYALUR & NA CHOND-NA HYALUR 0.55-0.5 ML IO KIT
PACK | INTRAOCULAR | Status: AC
  Filled 2017-02-03: qty 1.05

## 2017-02-03 MED ORDER — MIDAZOLAM HCL 2 MG/2ML IJ SOLN
INTRAMUSCULAR | Status: AC
Start: 2017-02-03 — End: 2017-02-03
  Filled 2017-02-03: qty 2

## 2017-02-03 MED ORDER — EPINEPHRINE PF 1 MG/ML IJ SOLN
INTRAMUSCULAR | Status: AC
  Filled 2017-02-03: qty 2

## 2017-02-03 MED ORDER — NEOMYCIN-POLYMYXIN-DEXAMETH 0.1 % OP OINT
TOPICAL_OINTMENT | OPHTHALMIC | Status: DC | PRN
  Administered 2017-02-03: 1 via OPHTHALMIC

## 2017-02-03 MED ORDER — CARBACHOL 0.01 % IO SOLN
INTRAOCULAR | Status: DC | PRN
  Administered 2017-02-03: 0.5 mL via INTRAOCULAR

## 2017-02-03 MED ORDER — NEOMYCIN-POLYMYXIN-DEXAMETH 3.5-10000-0.1 OP OINT
TOPICAL_OINTMENT | OPHTHALMIC | Status: AC
  Filled 2017-02-03: qty 3.5

## 2017-02-03 SURGICAL SUPPLY — 15 items
GLOVE BIO SURGEON STRL SZ8 (GLOVE) ×2 IMPLANT
GLOVE BIOGEL M 6.5 STRL (GLOVE) ×2 IMPLANT
GLOVE SURG LX 7.5 STRW (GLOVE) ×1
GLOVE SURG LX STRL 7.5 STRW (GLOVE) ×1 IMPLANT
GOWN STRL REUS W/ TWL LRG LVL3 (GOWN DISPOSABLE) ×2 IMPLANT
GOWN STRL REUS W/TWL LRG LVL3 (GOWN DISPOSABLE) ×2
LENS IOL TECNIS ITEC 23.5 (Intraocular Lens) ×2 IMPLANT
PACK CATARACT (MISCELLANEOUS) ×2 IMPLANT
PACK CATARACT BRASINGTON LX (MISCELLANEOUS) ×2 IMPLANT
PACK EYE AFTER SURG (MISCELLANEOUS) ×2 IMPLANT
SOL BSS BAG (MISCELLANEOUS) ×2
SOLUTION BSS BAG (MISCELLANEOUS) ×1 IMPLANT
SYR 5ML LL (SYRINGE) ×2 IMPLANT
WATER STERILE IRR 250ML POUR (IV SOLUTION) ×2 IMPLANT
WIPE NON LINTING 3.25X3.25 (MISCELLANEOUS) ×2 IMPLANT

## 2017-02-03 NOTE — Anesthesia Postprocedure Evaluation (Signed)
Anesthesia Post Note  Patient: [name]  Procedure(s) Performed: Procedure(s) (LRB): CATARACT EXTRACTION PHACO AND INTRAOCULAR LENS PLACEMENT (IOC) (Right)  Patient location during evaluation: PACU Anesthesia Type: MAC Level of consciousness: awake and alert Pain management: pain level controlled Vital Signs Assessment: post-procedure vital signs reviewed and stable Respiratory status: spontaneous breathing, nonlabored ventilation and respiratory function stable Cardiovascular status: stable and blood pressure returned to baseline Anesthetic complications: no     Last Vitals:  Vitals:   02/03/17 0614 02/03/17 0800  BP: 129/67 (!) 140/54  Pulse: 60 (!) 54  Resp: 18 16  Temp: 36.7 C (!) 36 C    Last Pain:  Vitals:   02/03/17 0614  TempSrc: Shane Crutch A

## 2017-02-03 NOTE — H&P (Signed)
The History and Physical notes are on paper, have been signed, and are to be scanned. The patient remains stable and unchanged from the H&P.   Previous H&P reviewed, patient examined, and there are no changes.  [name] 02/03/2017 7:27 AM

## 2017-02-03 NOTE — Discharge Instructions (Signed)

## 2017-02-03 NOTE — Op Note (Signed)
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 Item Serial No. Manufacturer Lot No. LRB No. Used  LENS IOL DIOP 23.5 - [id number] Intraocular Lens LENS IOL DIOP 23.5 [id number] AMO   Right 1       ULTRASOUND TIME: 13 %  of 0 minutes 36 seconds, CDE 4.6  SURGEON:  Wyonia Hough, MD   ANESTHESIA:  Topical with tetracaine drops and 2% Xylocaine jelly, augmented with 1% preservative-free intracameral lidocaine.    COMPLICATIONS:  None.   DESCRIPTION OF PROCEDURE:  The patient was identified in the holding room and transported to the operating room and placed in the supine position under the operating microscope. Theright eye was identified as the operative eye and it was prepped and draped in the usual sterile ophthalmic fashion.   A 1 millimeter clear-corneal paracentesis was made at the 12:00 position.  0.5 ml of preservative-free 1% lidocaine was injected into the anterior chamber. The anterior chamber was filled with Viscoat viscoelastic.  A 2.4 millimeter keratome was used to make a near-clear corneal incision at the 9:00 position. A curvilinear capsulorrhexis was made with a cystotome and capsulorrhexis forceps.  Balanced salt solution was used to hydrodissect and hydrodelineate the nucleus.   Phacoemulsification was then used in stop and chop fashion to remove the lens nucleus and epinucleus.  The remaining cortex was then removed using the irrigation and aspiration handpiece. Provisc was then placed into the capsular bag to distend it for lens placement.  A lens was then injected into the capsular bag.  The remaining viscoelastic was aspirated.  Wounds were hydrated with balanced  salt solution.  The anterior chamber was inflated to a physiologic pressure with balanced salt solution. Vigamox 0.2 ml of a 1mg  per ml solution was injected into the anterior chamber for a dose of 0.2 mg of intracameral antibiotic at the completion of the case. Miostat was placed into the anterior chamber to constrict the pupil.  No wound leaks were noted.  Topical Vigamox drops and Maxitrol ointment were applied to the eye.  The patient was taken to the recovery room in stable condition without complications of anesthesia or surgery.  [name] 02/03/2017, 7:57 AM

## 2017-02-03 NOTE — Transfer of Care (Signed)
Immediate Anesthesia Transfer of Care Note  Patient: [name]  Procedure(s) Performed: Procedure(s) with comments: CATARACT EXTRACTION PHACO AND INTRAOCULAR LENS PLACEMENT (IOC) (Right) - [place] 00:35.8 AP% 12.8 CDE 4.57 Fluid lot # [id number]  Patient Location: PACU  Anesthesia Type:MAC  Level of Consciousness: awake, alert , oriented and patient cooperative  Airway & Oxygen Therapy: Patient Spontanous Breathing  Post-op Assessment: Report given to RN, Post -op Vital signs reviewed and stable and Patient moving all extremities X 4  Post vital signs: Reviewed and stable  Last Vitals:  Vitals:   02/03/17 0614 02/03/17 0800  BP: 129/67 (!) 140/54  Pulse: 60 (!) 54  Resp: 18 16  Temp: 36.7 C (!) 36 C    Last Pain:  Vitals:   02/03/17 0614  TempSrc: Oral         Complications: No apparent anesthesia complications

## 2017-02-03 NOTE — Anesthesia Post-op Follow-up Note (Cosign Needed)
Anesthesia QCDR form completed.        

## 2017-02-03 NOTE — Anesthesia Preprocedure Evaluation (Addendum)
Anesthesia Evaluation  Patient identified by MRN, date of birth, ID band Patient awake    Reviewed: Allergy & Precautions, NPO status , Patient's Chart, lab work & pertinent test results, reviewed documented beta blocker date and time   Airway Mallampati: II  TM Distance: >3 FB     Dental  (+) Chipped, Partial Upper   Pulmonary           Cardiovascular hypertension, Pt. on medications and Pt. on home beta blockers + CAD, + Past MI and + CABG       Neuro/Psych    GI/Hepatic   Endo/Other  diabetes, Type 2  Renal/GU Renal disease     Musculoskeletal  (+) Arthritis ,   Abdominal   Peds  Hematology   Anesthesia Other Findings Renal transplant.   Reproductive/Obstetrics                           Anesthesia Physical Anesthesia Plan  ASA: III  Anesthesia Plan: MAC   Post-op Pain Management:    Induction:   PONV Risk Score and Plan:   Airway Management Planned:   Additional Equipment:   Intra-op Plan:   Post-operative Plan:   Informed Consent: I have reviewed the patients History and Physical, chart, labs and discussed the procedure including the risks, benefits and alternatives for the proposed anesthesia with the patient or authorized representative who has indicated his/her understanding and acceptance.     Plan Discussed with: CRNA  Anesthesia Plan Comments:         Anesthesia Quick Evaluation

## 2017-02-15 ENCOUNTER — Ambulatory Visit
Admission: RE | Admit: 2017-02-15 | Discharge: 2017-02-15 | Disposition: A | Discharge status: Home / Self Care | Payer: MEDICARE

## 2017-02-15 ENCOUNTER — Ambulatory Visit: Admission: RE | Admit: 2017-02-15 | Discharge: 2017-02-15 | Disposition: A | Discharge status: Home / Self Care

## 2017-02-15 DIAGNOSIS — Z940 Kidney transplant status: Principal | ICD-10-CM

## 2017-02-15 DIAGNOSIS — E559 Vitamin D deficiency, unspecified: Secondary | ICD-10-CM

## 2017-02-15 DIAGNOSIS — Z48298 Encounter for aftercare following other organ transplant: Secondary | ICD-10-CM

## 2017-02-15 DIAGNOSIS — D899 Disorder involving the immune mechanism, unspecified: Secondary | ICD-10-CM

## 2017-02-15 DIAGNOSIS — Z0000 Encounter for general adult medical examination without abnormal findings: Secondary | ICD-10-CM

## 2017-02-16 DIAGNOSIS — Z48298 Encounter for aftercare following other organ transplant: Secondary | ICD-10-CM | POA: Insufficient documentation

## 2017-03-21 ENCOUNTER — Ambulatory Visit
Admission: RE | Admit: 2017-03-21 | Discharge: 2017-03-21 | Disposition: A | Discharge status: Home / Self Care | Payer: MEDICARE

## 2017-03-21 DIAGNOSIS — Z940 Kidney transplant status: Principal | ICD-10-CM

## 2017-03-21 DIAGNOSIS — Z0000 Encounter for general adult medical examination without abnormal findings: Secondary | ICD-10-CM

## 2017-03-21 DIAGNOSIS — E559 Vitamin D deficiency, unspecified: Secondary | ICD-10-CM

## 2017-04-27 ENCOUNTER — Ambulatory Visit
Admission: RE | Admit: 2017-04-27 | Discharge: 2017-04-27 | Disposition: A | Discharge status: Home / Self Care | Payer: MEDICARE

## 2017-04-27 DIAGNOSIS — Z0000 Encounter for general adult medical examination without abnormal findings: Secondary | ICD-10-CM

## 2017-04-27 DIAGNOSIS — E559 Vitamin D deficiency, unspecified: Secondary | ICD-10-CM

## 2017-04-27 DIAGNOSIS — Z940 Kidney transplant status: Secondary | ICD-10-CM

## 2017-06-03 ENCOUNTER — Ambulatory Visit
Admission: RE | Admit: 2017-06-03 | Discharge: 2017-06-03 | Disposition: A | Discharge status: Home / Self Care | Payer: MEDICARE | Attending: Nephrology | Admitting: Nephrology

## 2017-06-03 ENCOUNTER — Ambulatory Visit
Admission: RE | Admit: 2017-06-03 | Discharge: 2017-06-03 | Disposition: A | Discharge status: Home / Self Care | Payer: MEDICARE

## 2017-06-03 DIAGNOSIS — Z940 Kidney transplant status: Principal | ICD-10-CM

## 2017-06-03 DIAGNOSIS — Z0000 Encounter for general adult medical examination without abnormal findings: Secondary | ICD-10-CM

## 2017-06-03 DIAGNOSIS — Z114 Encounter for screening for human immunodeficiency virus [HIV]: Secondary | ICD-10-CM

## 2017-06-03 DIAGNOSIS — E1121 Type 2 diabetes mellitus with diabetic nephropathy: Secondary | ICD-10-CM

## 2017-06-03 DIAGNOSIS — Z794 Long term (current) use of insulin: Secondary | ICD-10-CM

## 2017-06-03 DIAGNOSIS — E559 Vitamin D deficiency, unspecified: Secondary | ICD-10-CM

## 2017-06-03 MED ORDER — TAMSULOSIN 0.4 MG CAPSULE
ORAL_CAPSULE | Freq: Every evening | ORAL | 3 refills | 0 days | Status: CP
Start: 2017-06-03 — End: 2017-08-23

## 2017-06-03 MED ORDER — INSULIN LISPRO (U-100) 100 UNIT/ML SUBCUTANEOUS PEN
PEN_INJECTOR | 5 refills | 0 days
Start: 2017-06-03 — End: ?

## 2017-06-06 ENCOUNTER — Ambulatory Visit
Admission: RE | Admit: 2017-06-06 | Discharge: 2017-06-06 | Disposition: A | Discharge status: Home / Self Care | Payer: MEDICARE

## 2017-06-06 DIAGNOSIS — Z79899 Other long term (current) drug therapy: Secondary | ICD-10-CM

## 2017-06-06 DIAGNOSIS — Z940 Kidney transplant status: Principal | ICD-10-CM

## 2017-08-09 ENCOUNTER — Ambulatory Visit: Admit: 2017-08-09 | Discharge: 2017-08-10 | Payer: MEDICARE

## 2017-08-09 DIAGNOSIS — Z940 Kidney transplant status: Principal | ICD-10-CM

## 2017-08-09 DIAGNOSIS — Z79899 Other long term (current) drug therapy: Secondary | ICD-10-CM

## 2017-08-23 ENCOUNTER — Ambulatory Visit (INDEPENDENT_AMBULATORY_CARE_PROVIDER_SITE_OTHER): Payer: Medicare Other | Admitting: Vascular Surgery

## 2017-08-23 ENCOUNTER — Ambulatory Visit (INDEPENDENT_AMBULATORY_CARE_PROVIDER_SITE_OTHER): Payer: Medicare Other

## 2017-08-23 ENCOUNTER — Encounter (INDEPENDENT_AMBULATORY_CARE_PROVIDER_SITE_OTHER): Payer: Self-pay | Admitting: Vascular Surgery

## 2017-08-23 VITALS — BP 121/57 | HR 61 | Resp 16 | Wt 171.6 lb

## 2017-08-23 DIAGNOSIS — Z940 Kidney transplant status: Secondary | ICD-10-CM

## 2017-08-23 DIAGNOSIS — E1159 Type 2 diabetes mellitus with other circulatory complications: Secondary | ICD-10-CM

## 2017-08-23 DIAGNOSIS — T829XXD Unspecified complication of cardiac and vascular prosthetic device, implant and graft, subsequent encounter: Secondary | ICD-10-CM | POA: Diagnosis not present

## 2017-08-23 DIAGNOSIS — I10 Essential (primary) hypertension: Secondary | ICD-10-CM

## 2017-08-23 DIAGNOSIS — T829XXA Unspecified complication of cardiac and vascular prosthetic device, implant and graft, initial encounter: Secondary | ICD-10-CM | POA: Insufficient documentation

## 2017-08-23 MED ORDER — TAMSULOSIN 0.4 MG CAPSULE
ORAL_CAPSULE | Freq: Every evening | ORAL | 3 refills | 0 days | Status: CP
Start: 2017-08-23 — End: 2017-08-26

## 2017-08-23 MED ORDER — AMLODIPINE 10 MG TABLET
ORAL_TABLET | Freq: Every day | ORAL | 3 refills | 0.00000 days | Status: CP
Start: 2017-08-23 — End: 2018-08-23

## 2017-08-23 MED ORDER — ATORVASTATIN 40 MG TABLET
ORAL_TABLET | Freq: Every day | ORAL | 4 refills | 0 days | Status: CP
Start: 2017-08-23 — End: 2018-08-31

## 2017-08-23 NOTE — Assessment & Plan Note (Signed)
His duplex today shows a stable moderately elevated velocity at the cephalic vein subclavian vein confluence.  We have elected not to treat this given his overall stable situation and not using the fistula in the past, and I think that will be reasonable again.  I will plan to recheck this in 1 year with duplex.

## 2017-08-23 NOTE — Assessment & Plan Note (Signed)
Transplant is functioning well and he is currently not using his fistula.

## 2017-08-23 NOTE — Assessment & Plan Note (Signed)
blood glucose control important in reducing the progression of atherosclerotic disease. Also, involved in wound healing. On appropriate medications.  

## 2017-08-23 NOTE — Assessment & Plan Note (Signed)
blood pressure control important in reducing the progression of atherosclerotic disease. On appropriate oral medications.  

## 2017-08-23 NOTE — Assessment & Plan Note (Signed)
>>  ASSESSMENT AND PLAN FOR DIABETES (HCC) WRITTEN ON 08/23/2017 10:42 AM BY DEW, Donald Frost, MD  blood glucose control important in reducing the progression of atherosclerotic disease. Also, involved in wound healing. On appropriate medications.

## 2017-08-23 NOTE — Progress Notes (Signed)
MRN : [id number]  [name] is a 62 y.o. (Oct 03, 1955) male who presents with chief complaint of  Chief Complaint  Patient presents with  . Follow-up    35yr HDA  .  History of Present Illness: Patient returns today in follow up of his left arm AV fistula.  He has not used the fistula since he got his kidney transplant.  Transplant is currently working well without any major issues.  His duplex today shows a stable moderately elevated velocity at the cephalic vein subclavian vein confluence.    Current Outpatient Medications  Medication Sig Dispense Refill  . acetaminophen (TYLENOL) 325 MG tablet Take 650 mg by mouth every 6 (six) hours as needed.    [name] amLODipine (NORVASC) 5 MG tablet Take 10 mg by mouth daily.     [name] aspirin 325 MG tablet Take 325 mg by mouth.    [name] aspirin EC 81 MG tablet Take 81 mg by mouth daily.    [name] atorvastatin (LIPITOR) 40 MG tablet 40 mg daily at 6 PM.     . BIOFLAVONOID PRODUCTS PO Take by mouth.    . carvedilol (COREG) 3.125 MG tablet 2 (two) times daily with a meal.     . chlorthalidone (HYGROTON) 25 MG tablet 25 mg daily.     . enalapril (VASOTEC) 5 MG tablet     . HUMALOG KWIKPEN 100 UNIT/ML KiwkPen 3 (three) times daily.     . isosorbide mononitrate (IMDUR) 30 MG 24 hr tablet     . LANTUS SOLOSTAR 100 UNIT/ML Solostar Pen 24 Units.     [name] losartan (COZAAR) 25 MG tablet TAKE ONE (1) TABLET BY MOUTH EVERY DAY    . nitroGLYCERIN (NITROLINGUAL) 0.4 MG/SPRAY spray     . PROGRAF 1 MG capsule 1 mg 2 (two) times daily.     . silver sulfADIAZINE (SILVADENE) 1 % cream Apply 1 application topically daily. 50 g 0  . tamsulosin (FLOMAX) 0.4 MG CAPS capsule Take 0.4 mg by mouth.     No current facility-administered medications for this visit.     Past Medical History:  Diagnosis Date  . Arthritis   . Chronic kidney disease    KIDNEY TRANSPLANT  . Coronary artery disease   . Diabetes mellitus without complication ([place])   . Myocardial infarction [employer])      Past Surgical History:  Procedure Laterality Date  . CATARACT EXTRACTION W/PHACO Right 02/03/2017   Procedure: CATARACT EXTRACTION PHACO AND INTRAOCULAR LENS PLACEMENT (IOC);  Surgeon: Leandrew Koyanagi, MD;  Location: [facility] ORS;  Service: Ophthalmology;  Laterality: Right;  [place] 00:35.8 AP% 12.8 CDE 4.57 Fluid lot # [id number]  . CORONARY ANGIOPLASTY     STENTS X 4  . CORONARY ARTERY BYPASS GRAFT    . HIP SURGERY    . KIDNEY TRANSPLANT     2016    Social History Social History   Tobacco Use  . Smoking status: Never Smoker  . Smokeless tobacco: Never Used  Substance Use Topics  . Alcohol use: No    Alcohol/week: 0.0 oz  . Drug use: No    Family History No bleeding disorders or clotting disorders  No Known Allergies   REVIEW OF SYSTEMS (Negative unless checked)  Constitutional: [] Weight loss  [] Fever  [] Chills Cardiac: [] Chest pain   [] Chest pressure   [] Palpitations   [] Shortness of breath when laying flat   [] Shortness of breath at rest   [x] Shortness of breath with exertion. Vascular:  [] Pain  in legs with walking   [] Pain in legs at rest   [] Pain in legs when laying flat   [] Claudication   [] Pain in feet when walking  [] Pain in feet at rest  [] Pain in feet when laying flat   [] History of DVT   [] Phlebitis   [] Swelling in legs   [] Varicose veins   [] Non-healing ulcers Pulmonary:   [] Uses home oxygen   [] Productive cough   [] Hemoptysis   [] Wheeze  [] COPD   [] Asthma Neurologic:  [] Dizziness  [] Blackouts   [] Seizures   [] History of stroke   [] History of TIA  [] Aphasia   [] Temporary blindness   [] Dysphagia   [] Weakness or numbness in arms   [] Weakness or numbness in legs Musculoskeletal:  [x] Arthritis   [] Joint swelling   [] Joint pain   [] Low back pain Hematologic:  [] Easy bruising  [] Easy bleeding   [] Hypercoagulable state   [] Anemic   Gastrointestinal:  [] Blood in stool   [] Vomiting blood  [] Gastroesophageal reflux/heartburn   [] Abdominal pain Genitourinary:  [x] Chronic  kidney disease   [] Difficult urination  [] Frequent urination  [] Burning with urination   [] Hematuria Skin:  [] Rashes   [] Ulcers   [] Wounds Psychological:  [] History of anxiety   []  History of major depression.  Physical Examination  BP (!) 121/57 (BP Location: Right Arm)   Pulse 61   Resp 16   Wt 77.8 kg (171 lb 9.6 oz)   BMI 26.88 kg/m  Gen:  WD/WN, NAD Head: [place]/AT, No temporalis wasting. Ear/Nose/Throat: Hearing grossly intact, nares w/o erythema or drainage, trachea midline Eyes: Conjunctiva clear. Sclera non-icteric Neck: Supple.  No JVD.  Pulmonary:  Good air movement, no use of accessory muscles.  Cardiac: RRR, normal S1, S2 Vascular: good thrill in left arm AVF Vessel Right Left  Radial Palpable Palpable                                    Musculoskeletal: M/S 5/5 throughout.  No deformity or atrophy. No LE edema. Neurologic: Sensation grossly intact in extremities.  Symmetrical.  Speech is fluent.  Psychiatric: Judgment intact, Mood & affect appropriate for pt's clinical situation. Dermatologic: No rashes or ulcers noted.  No cellulitis or open wounds.       Labs No results found for this or any previous visit (from the past 2160 hour(s)).  Radiology No results found.   Assessment/Plan  Diabetes (HCC) blood glucose control important in reducing the progression of atherosclerotic disease. Also, involved in wound healing. On appropriate medications.   Essential hypertension blood pressure control important in reducing the progression of atherosclerotic disease. On appropriate oral medications.   Kidney transplant recipient Transplant is functioning well and he is currently not using his fistula.  Complication of arteriovenous dialysis fistula His duplex today shows a stable moderately elevated velocity at the cephalic vein subclavian vein confluence.  We have elected not to treat this given his overall stable situation and not using the fistula in  the past, and I think that will be reasonable again.  I will plan to recheck this in 1 year with duplex.    Leotis Pain, MD  08/23/2017 11:58 AM    This note was created with Dragon medical transcription system.  Any errors from dictation are purely unintentional

## 2017-08-26 MED ORDER — TAMSULOSIN 0.4 MG CAPSULE
ORAL_CAPSULE | Freq: Every evening | ORAL | 3 refills | 0 days | Status: CP
Start: 2017-08-26 — End: 2018-08-26

## 2017-09-20 ENCOUNTER — Inpatient Hospital Stay
Admission: EM | Admit: 2017-09-20 | Discharge: 2017-09-21 | DRG: 313 | Disposition: A | Discharge status: Home / Self Care | Payer: Medicare Other | Attending: Internal Medicine | Admitting: Internal Medicine

## 2017-09-20 ENCOUNTER — Other Ambulatory Visit: Payer: Self-pay

## 2017-09-20 ENCOUNTER — Encounter: Payer: Self-pay | Admitting: Emergency Medicine

## 2017-09-20 ENCOUNTER — Emergency Department: Payer: Medicare Other

## 2017-09-20 ENCOUNTER — Encounter: Admit: 2017-09-20 | Discharge: 2017-09-21 | Payer: MEDICARE

## 2017-09-20 DIAGNOSIS — E119 Type 2 diabetes mellitus without complications: Secondary | ICD-10-CM | POA: Diagnosis present

## 2017-09-20 DIAGNOSIS — Z955 Presence of coronary angioplasty implant and graft: Secondary | ICD-10-CM | POA: Diagnosis not present

## 2017-09-20 DIAGNOSIS — I10 Essential (primary) hypertension: Secondary | ICD-10-CM | POA: Diagnosis present

## 2017-09-20 DIAGNOSIS — R079 Chest pain, unspecified: Principal | ICD-10-CM | POA: Diagnosis present

## 2017-09-20 DIAGNOSIS — I4891 Unspecified atrial fibrillation: Secondary | ICD-10-CM | POA: Diagnosis not present

## 2017-09-20 DIAGNOSIS — Z79899 Other long term (current) drug therapy: Secondary | ICD-10-CM

## 2017-09-20 DIAGNOSIS — Z9841 Cataract extraction status, right eye: Secondary | ICD-10-CM | POA: Diagnosis not present

## 2017-09-20 DIAGNOSIS — M1990 Unspecified osteoarthritis, unspecified site: Secondary | ICD-10-CM | POA: Diagnosis present

## 2017-09-20 DIAGNOSIS — Z794 Long term (current) use of insulin: Secondary | ICD-10-CM | POA: Diagnosis not present

## 2017-09-20 DIAGNOSIS — Z940 Kidney transplant status: Secondary | ICD-10-CM | POA: Diagnosis not present

## 2017-09-20 DIAGNOSIS — I252 Old myocardial infarction: Secondary | ICD-10-CM

## 2017-09-20 DIAGNOSIS — Z7982 Long term (current) use of aspirin: Secondary | ICD-10-CM

## 2017-09-20 DIAGNOSIS — Z951 Presence of aortocoronary bypass graft: Secondary | ICD-10-CM | POA: Diagnosis not present

## 2017-09-20 DIAGNOSIS — Z961 Presence of intraocular lens: Secondary | ICD-10-CM | POA: Diagnosis present

## 2017-09-20 DIAGNOSIS — I2510 Atherosclerotic heart disease of native coronary artery without angina pectoris: Secondary | ICD-10-CM | POA: Diagnosis not present

## 2017-09-20 HISTORY — DX: Chest pain, unspecified: R07.9

## 2017-09-20 LAB — HEPATIC FUNCTION PANEL
ALBUMIN: 4.3 g/dL (ref 3.5–5.0)
ALK PHOS: 55 U/L (ref 38–126)
ALT: 19 U/L (ref 17–63)
AST: 25 U/L (ref 15–41)
Bilirubin, Direct: <0.1 mg/dL — ABNORMAL LOW (ref 0.1–0.5)
TOTAL PROTEIN: 7.6 g/dL (ref 6.5–8.1)
Total Bilirubin: 1 mg/dL (ref 0.3–1.2)

## 2017-09-20 LAB — GLUCOSE, CAPILLARY: Glucose-Capillary: 208 mg/dL — ABNORMAL HIGH (ref 65–99)

## 2017-09-20 LAB — CBC
HEMATOCRIT: 48.4 % (ref 40.0–52.0)
HEMOGLOBIN: 16.6 g/dL (ref 13.0–18.0)
MCH: 30 pg (ref 26.0–34.0)
MCHC: 34.2 g/dL (ref 32.0–36.0)
MCV: 87.8 fL (ref 80.0–100.0)
Platelets: 183 10*3/uL (ref 150–440)
RBC: 5.51 MIL/uL (ref 4.40–5.90)
RDW: 13.6 % (ref 11.5–14.5)
WBC: 8.2 10*3/uL (ref 3.8–10.6)

## 2017-09-20 LAB — BASIC METABOLIC PANEL
ANION GAP: 10 (ref 5–15)
BUN: 30 mg/dL — ABNORMAL HIGH (ref 6–20)
CHLORIDE: 100 mmol/L — AB (ref 101–111)
CO2: 24 mmol/L (ref 22–32)
Calcium: 9 mg/dL (ref 8.9–10.3)
Creatinine, Ser: 1.24 mg/dL (ref 0.61–1.24)
Glucose, Bld: 141 mg/dL — ABNORMAL HIGH (ref 65–99)
POTASSIUM: 4.3 mmol/L (ref 3.5–5.1)
SODIUM: 134 mmol/L — AB (ref 135–145)

## 2017-09-20 LAB — APTT: APTT: 29 s (ref 24–36)

## 2017-09-20 LAB — TROPONIN I: Troponin I: <0.03 ng/mL (ref <0.03)

## 2017-09-20 LAB — PROTIME-INR
INR: 1.13
Prothrombin Time: 14.4 seconds (ref 11.4–15.2)

## 2017-09-20 LAB — LIPASE, BLOOD: Lipase: 25 U/L (ref 11–51)

## 2017-09-20 MED ORDER — ACETAMINOPHEN 650 MG RE SUPP: 650.0000 mg | Freq: Four times a day (QID) | RECTAL | Status: DC | PRN

## 2017-09-20 MED ORDER — TACROLIMUS 1 MG PO CAPS
1.0000 mg | ORAL_CAPSULE | Freq: Two times a day (BID) | ORAL | Status: DC
  Administered 2017-09-21: 1 mg via ORAL
  Filled 2017-09-20: qty 1

## 2017-09-20 MED ORDER — INSULIN ASPART 100 UNIT/ML ~~LOC~~ SOLN
0.0000 [IU] | Freq: Every day | SUBCUTANEOUS | Status: DC
  Administered 2017-09-20: 2 [IU] via SUBCUTANEOUS
  Filled 2017-09-20: qty 1

## 2017-09-20 MED ORDER — NITROGLYCERIN 0.4 MG SL SUBL: 0.4000 mg | SUBLINGUAL_TABLET | SUBLINGUAL | Status: DC | PRN

## 2017-09-20 MED ORDER — HYDROCODONE-ACETAMINOPHEN 5-325 MG PO TABS: 1.0000 | ORAL_TABLET | ORAL | Status: DC | PRN

## 2017-09-20 MED ORDER — AMLODIPINE BESYLATE 10 MG PO TABS
10.0000 mg | ORAL_TABLET | Freq: Every day | ORAL | Status: DC
  Administered 2017-09-21: 10 mg via ORAL
  Filled 2017-09-20: qty 1

## 2017-09-20 MED ORDER — ONDANSETRON HCL 4 MG/2ML IJ SOLN
4.0000 mg | Freq: Four times a day (QID) | INTRAMUSCULAR | Status: DC | PRN
Start: 2017-09-20 — End: 2017-09-21

## 2017-09-20 MED ORDER — CARVEDILOL 3.125 MG PO TABS
3.1250 mg | ORAL_TABLET | Freq: Two times a day (BID) | ORAL | Status: DC
  Administered 2017-09-21: 3.125 mg via ORAL
  Filled 2017-09-20: qty 1

## 2017-09-20 MED ORDER — HEPARIN BOLUS VIA INFUSION
4600.0000 [IU] | Freq: Once | INTRAVENOUS | Status: AC
  Administered 2017-09-20: 4600 [IU] via INTRAVENOUS
  Filled 2017-09-20: qty 4600

## 2017-09-20 MED ORDER — ONDANSETRON HCL 4 MG PO TABS: 4.0000 mg | ORAL_TABLET | Freq: Four times a day (QID) | ORAL | Status: DC | PRN

## 2017-09-20 MED ORDER — INSULIN GLARGINE 100 UNIT/ML ~~LOC~~ SOLN
24.0000 [IU] | Freq: Every day | SUBCUTANEOUS | Status: DC
  Administered 2017-09-20: 24 [IU] via SUBCUTANEOUS
  Filled 2017-09-20 (×2): qty 0.24

## 2017-09-20 MED ORDER — DOCUSATE SODIUM 100 MG PO CAPS
100.0000 mg | ORAL_CAPSULE | Freq: Two times a day (BID) | ORAL | Status: DC
  Administered 2017-09-21: 100 mg via ORAL
  Filled 2017-09-20: qty 1

## 2017-09-20 MED ORDER — INSULIN GLARGINE 100 UNIT/ML SOLOSTAR PEN: 24.0000 [IU] | PEN_INJECTOR | Freq: Every day | SUBCUTANEOUS | Status: DC

## 2017-09-20 MED ORDER — ACETAMINOPHEN 325 MG PO TABS: 650.0000 mg | ORAL_TABLET | Freq: Four times a day (QID) | ORAL | Status: DC | PRN

## 2017-09-20 MED ORDER — NITROGLYCERIN 0.4 MG/SPRAY TL SOLN: 1.0000 | Status: DC | PRN

## 2017-09-20 MED ORDER — TAMSULOSIN HCL 0.4 MG PO CAPS
0.4000 mg | ORAL_CAPSULE | Freq: Two times a day (BID) | ORAL | Status: DC
  Filled 2017-09-20: qty 1

## 2017-09-20 MED ORDER — INSULIN ASPART 100 UNIT/ML ~~LOC~~ SOLN
0.0000 [IU] | Freq: Three times a day (TID) | SUBCUTANEOUS | Status: DC
  Administered 2017-09-21: 1 [IU] via SUBCUTANEOUS
  Filled 2017-09-20: qty 1

## 2017-09-20 MED ORDER — ATORVASTATIN CALCIUM 20 MG PO TABS: 40.0000 mg | ORAL_TABLET | Freq: Every day | ORAL | Status: DC

## 2017-09-20 MED ORDER — CHLORTHALIDONE 25 MG PO TABS
25.0000 mg | ORAL_TABLET | Freq: Every day | ORAL | Status: DC
  Administered 2017-09-21: 25 mg via ORAL
  Filled 2017-09-20: qty 1

## 2017-09-20 MED ORDER — BISACODYL 5 MG PO TBEC: 5.0000 mg | DELAYED_RELEASE_TABLET | Freq: Every day | ORAL | Status: DC | PRN

## 2017-09-20 MED ORDER — ASPIRIN 81 MG PO CHEW
324.0000 mg | CHEWABLE_TABLET | Freq: Once | ORAL | Status: AC
Start: 2017-09-20 — End: 2017-09-20
  Administered 2017-09-20: 243 mg via ORAL
  Filled 2017-09-20: qty 4

## 2017-09-20 MED ORDER — HEPARIN (PORCINE) IN NACL 100-0.45 UNIT/ML-% IJ SOLN
1050.0000 [IU]/h | INTRAMUSCULAR | Status: DC
  Administered 2017-09-20: 1200 [IU]/h via INTRAVENOUS
  Filled 2017-09-20 (×2): qty 250

## 2017-09-20 NOTE — ED Triage Notes (Signed)
Pt in via [place]; sent over from [facility] for further evaluation.  Pt with complaints of intermittent left sided chest pain with associated shortness of breath since Friday.  Pt reports using 1 Nitro spray today with some relief.  Pt with hx of CABG 2007.  Vitals WDL, NAD noted at this time.

## 2017-09-20 NOTE — ED Provider Notes (Addendum)
[facility] Emergency Department Provider Note  ____________________________________________   I have reviewed the triage vital signs and the nursing notes. Where available I have reviewed prior notes and, if possible and indicated, outside hospital notes.    HISTORY  Chief Complaint Chest Pain    HPI [name] is a 62 y.o. male with a past medical history including kidney transplant, on immunosuppressive therapy, CAD, status post CABG in 2007, history is using my Spanish, as well as family, patient declines interpreter, he is very comfortable with my level Spanish, he understands he can change his mind at any time.  In any event, patient states for the last couple days, since Friday, he has been having intermittent fleeting discomfort in his left chest wall always in the same spot.  It is a "ache" like pain.  No radiation.  He states it lasts for a brief period of time and then he will take nitroglycerin and it goes away.  He does not have exertional or pleuritic symptoms.  He denies shortness of breath at this time although he states he has been feeling a little bit short of breath over the last few days.  Denies leg swelling or recent travel.  Patient does have her remote history thinks of atrial fibrillation, it is listed in his med list as a prior history however, he states he is not being treated for it and as far as he knows he has been in sinus rhythm.  Patient has had no significant exertional symptoms.  Pain does not radiate he states he is taken nitroglycerin for the past is not having pain right now he cannot see if this is similar to prior angina because he states he did not have significant pain when he had his myocardial infarction, he has not had this pain before.  Patient also states he is under some stress as a family member recently passed away. Is not having any pain at this moment.  He has had however multiple different episodes in the last few  days  Past Medical History:  Diagnosis Date  . Arthritis   . Chronic kidney disease    KIDNEY TRANSPLANT  . Coronary artery disease   . Diabetes mellitus without complication ([place])   . Myocardial infarction [facility])     Patient Active Problem List   Diagnosis Date Noted  . Complication of arteriovenous dialysis fistula 08/23/2017  . Kidney transplant recipient 08/20/2016  . Diabetes ([place]) 08/20/2016  . Essential hypertension 08/20/2016    Past Surgical History:  Procedure Laterality Date  . CATARACT EXTRACTION W/PHACO Right 02/03/2017   Procedure: CATARACT EXTRACTION PHACO AND INTRAOCULAR LENS PLACEMENT (IOC);  Surgeon: Leandrew Koyanagi, MD;  Location: [facility] ORS;  Service: Ophthalmology;  Laterality: Right;  [place] 00:35.8 AP% 12.8 CDE 4.57 Fluid lot # [id number]  . CORONARY ANGIOPLASTY     STENTS X 4  . CORONARY ARTERY BYPASS GRAFT    . HIP SURGERY    . KIDNEY TRANSPLANT     2016    Prior to Admission medications   Medication Sig Start Date End Date Taking? Authorizing Provider  acetaminophen (TYLENOL) 325 MG tablet Take 650 mg by mouth every 6 (six) hours as needed.    [provider]  amLODipine (NORVASC) 5 MG tablet Take 10 mg by mouth daily.  08/06/16   [provider]  aspirin 325 MG tablet Take 325 mg by mouth.    [provider]  aspirin EC 81 MG tablet Take  81 mg by mouth daily.    [provider]  atorvastatin (LIPITOR) 40 MG tablet 40 mg daily at 6 PM.  12/23/14   [provider]  BIOFLAVONOID PRODUCTS PO Take by mouth.    [provider]  carvedilol (COREG) 3.125 MG tablet 2 (two) times daily with a meal.  06/02/16   [provider]  chlorthalidone (HYGROTON) 25 MG tablet 25 mg daily.  08/10/16   [provider]  enalapril (VASOTEC) 5 MG tablet  01/18/15   [provider]  HUMALOG KWIKPEN 100 UNIT/ML KiwkPen 3 (three) times daily.  07/10/16   [provider]  isosorbide mononitrate  (IMDUR) 30 MG 24 hr tablet  02/20/15   [provider]  LANTUS SOLOSTAR 100 UNIT/ML Solostar Pen 24 Units.  01/18/15   [provider]  losartan (COZAAR) 25 MG tablet TAKE ONE (1) TABLET BY MOUTH EVERY DAY 04/20/16   [provider]  nitroGLYCERIN (NITROLINGUAL) 0.4 MG/SPRAY spray  07/14/16   [provider]  PROGRAF 1 MG capsule 1 mg 2 (two) times daily.  08/11/16   [provider]  silver sulfADIAZINE (SILVADENE) 1 % cream Apply 1 application topically daily. 02/25/15   Trula Slade, DPM  tamsulosin (FLOMAX) 0.4 MG CAPS capsule Take 0.4 mg by mouth.    [provider]    Allergies Patient has no known allergies.  Family History  Problem Relation Age of Onset  . Drug abuse Sister   . Drug abuse Brother     Social History Social History   Tobacco Use  . Smoking status: Never Smoker  . Smokeless tobacco: Never Used  Substance Use Topics  . Alcohol use: No    Alcohol/week: 0.0 oz  . Drug use: No    Review of Systems Constitutional: No fever/chills Eyes: No visual changes. ENT: No sore throat. No stiff neck no neck pain Cardiovascular: + chest pain. Respiratory: + shortness of breath. Gastrointestinal:   no vomiting.  No diarrhea.  No constipation. Genitourinary: Negative for dysuria. Musculoskeletal: Negative lower extremity swelling Skin: Negative for rash. Neurological: Negative for severe headaches, focal weakness or numbness.   ____________________________________________   PHYSICAL EXAM:  VITAL SIGNS: ED Triage Vitals [09/20/17 1634]  Enc Vitals Group     BP 127/61     Pulse Rate 88     Resp 16     Temp (!) 97.5 F (36.4 C)     Temp Source Oral     SpO2 100 %     Weight 170 lb (77.1 kg)     Height 5\' 8"  (1.727 m)     Head Circumference      Peak Flow      Pain Score      Pain Loc      Pain Edu?      Excl. in [place]?     Constitutional: Alert and oriented. Well appearing and in no acute  distress. Eyes: Conjunctivae are normal Head: Atraumatic HEENT: No congestion/rhinnorhea. Mucous membranes are moist.  Oropharynx non-erythematous Neck:   Nontender with no meningismus, no masses, no stridor Cardiovascular: Normal rate, regular irregular. Grossly normal heart sounds.  Good peripheral circulation. Respiratory: Normal respiratory effort.  No retractions. Lungs CTAB. Abdominal: Soft and nontender. No distention. No guarding no rebound Back:  There is no focal tenderness or step off.  there is no midline tenderness there are no lesions noted. there is no CVA tenderness Musculoskeletal: No lower extremity tenderness, no upper extremity tenderness.  No joint effusions, no DVT signs strong distal pulses no edema Neurologic:  Normal speech and language. No gross focal neurologic deficits are appreciated.  Skin:  Skin is warm, dry and intact. No rash noted. Psychiatric: Mood and affect are normal. Speech and behavior are normal.  ____________________________________________   LABS (all labs ordered are listed, but only abnormal results are displayed)  Labs Reviewed  BASIC METABOLIC PANEL - Abnormal; Notable for the following components:      Result Value   Sodium 134 (*)    Chloride 100 (*)    Glucose, Bld 141 (*)    BUN 30 (*)    All other components within normal limits  HEPATIC FUNCTION PANEL - Abnormal; Notable for the following components:   Bilirubin, Direct <0.1 (*)    All other components within normal limits  CBC  TROPONIN I  TROPONIN I  LIPASE, BLOOD    Pertinent labs  results that were available during my care of the patient were reviewed by me and considered in my medical decision making (see chart for details). ____________________________________________  EKG  I personally interpreted any EKGs ordered by me or triage Atrial fibrillation rate 98 bpm no acute ST elevation or depression normal axis unremarkable EKG aside from atrial  fibrillation ____________________________________________  RADIOLOGY  Pertinent labs & imaging results that were available during my care of the patient were reviewed by me and considered in my medical decision making (see chart for details). If possible, patient and/or family made aware of any abnormal findings.  Reviewed his x-ray  Dg Chest 2 View  Result Date: 09/20/2017 CLINICAL DATA:  Intermittent left-sided chest pain with dyspnea since Friday. EXAM: CHEST  2 VIEW COMPARISON:  02/14/2011 FINDINGS: Heart is top-normal in size with aortic atherosclerosis. Patient is status post median sternotomy and CABG. Scarring is seen at the right lung base with atelectasis. Chronic mild pleural thickening along the periphery of the lungs bilaterally versus chronic loculated fluid. No acute pneumonic consolidation, effusion or pneumothorax. IMPRESSION: Scarring and atelectasis at the right lung base. No active pulmonary disease. Chronic bilateral pleural thickening versus fluid loculations unchanged on the right and more apparent on the left since 2012. Electronically Signed   By: Ashley Royalty M.D.   On: 09/20/2017 17:24   ____________________________________________    PROCEDURES  Procedure(s) performed: None  Procedures  Critical Care performed: None  ____________________________________________   INITIAL IMPRESSION / ASSESSMENT AND PLAN / ED COURSE  Pertinent labs & imaging results that were available during my care of the patient were reviewed by me and considered in my medical decision making (see chart for details).  Patient here with somewhat nonspecific chest pain, atypical, however, is not reproducible, and it has been persistent over the last for 5 days.  He has been having relief with nitroglycerin.  Is not having pain right now.  Patient does have a significant history of ACS, and he is currently in atrial fibrillation which he believes is a new thing for him.  He is not  anticoagulated.  We will give him aspirin, and we will admit him for further evaluation of possible anginal symptoms.  At this time, there does not appear to be clinical evidence to support the diagnosis of pulmonary embolus, dissection, myocarditis, endocarditis, pericarditis, pericardial tamponade,  pneumothorax, pneumonia,.  We will discussed with the hospitalist.    ____________________________________________   FINAL CLINICAL IMPRESSION(S) / ED DIAGNOSES  Final diagnoses:  None      This chart was dictated  using voice recognition software.  Despite best efforts to proofread,  errors can occur which can change meaning.      Schuyler Amor, MD 09/20/17 2041    Schuyler Amor, MD 09/20/17 2041

## 2017-09-20 NOTE — ED Notes (Signed)
Report given to 2A

## 2017-09-20 NOTE — H&P (Addendum)
[place] at [place] NAME: [name]    MR#:  [id number]  DATE OF BIRTH:  1955-09-21  DATE OF ADMISSION:  09/20/2017  PRIMARY CARE PHYSICIAN: Lianne Bushy, MD   REQUESTING/REFERRING PHYSICIAN:   CHIEF COMPLAINT:   Chief Complaint  Patient presents with  . Chest Pain    HISTORY OF PRESENT ILLNESS: [name]  is a 62 y.o. male with a known history of coronary artery disease, status post MI, status post CABG in 2007.  Patient also has history of diabetes type 2, complicated with chronic kidney disease for which patient required kidney transplant in 2016.  Patient presented to emergency room for intermittent chest pain, at the left side of the chest, without any radiation, going on for the past 3 days.  He describes the pain as tightness,  7 out of 10 in severity, associated with occasional palpitations.  The pain improves with nitroglycerin and does not seem to be affected by exertion.  Patient denies any fever or chills, no nausea/vomiting/diarrhea, no bleeding.  Of note, patient mentioned that he has been under a lot of emotional stress in the past week, as he witnessed the death one of his close relatives. Patient is currently asymptomatic, status post nitro, given in emergency room.  EKG is reviewed by myself and shows atrial fibrillation with heart rate in 80s; no acute ST-T changes.  Chest x-ray is noted without any acute cardiopulmonary disease.  Blood test results are unremarkable.  Troponin level is within normal limits; creatinine level is 1.24 and blood sugar was 141. Patient is admitted for further evaluation and rule out acute coronary syndrome.   PAST MEDICAL HISTORY:   Past Medical History:  Diagnosis Date  . Arthritis   . Chronic kidney disease    KIDNEY TRANSPLANT  . Coronary artery disease   . Diabetes mellitus without complication ([place])   . Myocardial infarction [facility])     PAST SURGICAL HISTORY:   Past Surgical History:  Procedure Laterality Date  . CATARACT EXTRACTION W/PHACO Right 02/03/2017   Procedure: CATARACT EXTRACTION PHACO AND INTRAOCULAR LENS PLACEMENT (IOC);  Surgeon: Leandrew Koyanagi, MD;  Location: [facility] ORS;  Service: Ophthalmology;  Laterality: Right;  [place] 00:35.8 AP% 12.8 CDE 4.57 Fluid lot # [id number]  . CORONARY ANGIOPLASTY     STENTS X 4  . CORONARY ARTERY BYPASS GRAFT    . HIP SURGERY    . KIDNEY TRANSPLANT     2016    SOCIAL HISTORY:  Social History   Tobacco Use  . Smoking status: Never Smoker  . Smokeless tobacco: Never Used  Substance Use Topics  . Alcohol use: No    Alcohol/week: 0.0 oz    FAMILY HISTORY:  Family History  Problem Relation Age of Onset  . Drug abuse Sister   . Drug abuse Brother     DRUG ALLERGIES: No Known Allergies  REVIEW OF SYSTEMS:   CONSTITUTIONAL: No fever, fatigue or weakness.  EYES: No vision changes.  EARS, NOSE, AND THROAT: No tinnitus or ear pain.  RESPIRATORY: No cough, shortness of breath, wheezing or hemoptysis.  CARDIOVASCULAR: Positive for chest pain, no orthopnea, noedema.  GASTROINTESTINAL: No nausea, vomiting, diarrhea or abdominal pain.  GENITOURINARY: No dysuria, hematuria.  ENDOCRINE: No polyuria, nocturia,  HEMATOLOGY: No bleeding. SKIN: No rash or lesion. MUSCULOSKELETAL: No joint pain.   NEUROLOGIC: No focal weakness.  PSYCHIATRY: No anxiety or depression.   MEDICATIONS AT HOME:  Prior to Admission  medications   Medication Sig Start Date End Date Taking? Authorizing Provider  acetaminophen (TYLENOL) 325 MG tablet Take 650 mg by mouth every 6 (six) hours as needed.   Yes [provider]  amLODipine (NORVASC) 10 MG tablet Take 10 mg by mouth daily.  08/06/16  Yes [provider]  atorvastatin (LIPITOR) 40 MG tablet 40 mg daily at 6 PM.  12/23/14  Yes [provider]  carvedilol (COREG) 3.125 MG tablet 2 (two) times daily with a meal.  06/02/16  Yes [provider]  chlorthalidone (HYGROTON) 25 MG tablet Take 25 mg by mouth daily.  08/10/16  Yes [provider]  HUMALOG KWIKPEN 100 UNIT/ML KiwkPen Inject 10-12 Units into the skin 3 (three) times daily. Inject 10u daily at breakfast, 10u at lunch and up to 12u at supper per SSI   Yes [provider]  LANTUS SOLOSTAR 100 UNIT/ML Solostar Pen Inject 24 Units into the skin at bedtime.  01/18/15  Yes [provider]  nitroGLYCERIN (NITROLINGUAL) 0.4 MG/SPRAY spray  07/14/16  Yes [provider]  PROGRAF 1 MG capsule Take 1 mg by mouth 2 (two) times daily.  08/11/16  Yes [provider]  tamsulosin (FLOMAX) 0.4 MG CAPS capsule Take 0.4 mg by mouth 2 (two) times daily.    Yes [provider]  silver sulfADIAZINE (SILVADENE) 1 % cream Apply 1 application topically daily. Patient not taking: Reported on 09/20/2017 02/25/15   Trula Slade, DPM      PHYSICAL EXAMINATION:   VITAL SIGNS: Blood pressure 127/66, pulse 70, temperature (!) 97.5 F (36.4 C), temperature source Oral, resp. rate 18, height 5\' 8"  (1.727 m), weight 77.1 kg (170 lb), SpO2 96 %.  GENERAL:  62 y.o.-year-old patient lying in the bed with no acute distress.  EYES: Pupils equal, round, reactive to light and accommodation. No scleral icterus. Extraocular muscles intact.  HEENT: Head atraumatic, normocephalic. Oropharynx and nasopharynx clear.  NECK:  Supple, no jugular venous distention. No thyroid enlargement, no tenderness.  LUNGS: Normal breath sounds bilaterally, no wheezing, rales,rhonchi or crepitation. No use of accessory muscles of respiration.  CARDIOVASCULAR: S1, S2 normal.  No S3/S4 ABDOMEN: Soft, nontender, nondistended. Bowel sounds present. No organomegaly or mass.  EXTREMITIES: No pedal edema, cyanosis, or clubbing.  NEUROLOGIC: No focal weakness.  Gait is stable. PSYCHIATRIC: The patient is alert and oriented x 3.  SKIN: No obvious rash, lesion, or ulcer.    LABORATORY PANEL:   CBC Recent Labs  Lab 09/20/17 1631  WBC 8.2  HGB 16.6  HCT 48.4  PLT 183  MCV 87.8  MCH 30.0  MCHC 34.2  RDW 13.6   ------------------------------------------------------------------------------------------------------------------  Chemistries  Recent Labs  Lab 09/20/17 1631  NA 134*  K 4.3  CL 100*  CO2 24  GLUCOSE 141*  BUN 30*  CREATININE 1.24  CALCIUM 9.0  AST 25  ALT 19  ALKPHOS 55  BILITOT 1.0   ------------------------------------------------------------------------------------------------------------------ estimated creatinine clearance is 59 mL/min (by C-G formula based on SCr of 1.24 mg/dL). ------------------------------------------------------------------------------------------------------------------ No results for input(s): TSH, T4TOTAL, T3FREE, THYROIDAB in the last 72 hours.  Invalid input(s): FREET3   Coagulation profile Recent Labs  Lab 09/20/17 2214  INR 1.13   ------------------------------------------------------------------------------------------------------------------- No results for input(s): DDIMER in the last 72 hours. -------------------------------------------------------------------------------------------------------------------  Cardiac Enzymes Recent Labs  Lab 09/20/17 1631 09/20/17 1932  TROPONINI <0.03 <0.03   ------------------------------------------------------------------------------------------------------------------ Invalid input(s): POCBNP  ---------------------------------------------------------------------------------------------------------------  Urinalysis    Component Value Date/Time  COLORURINE YELLOW 12/07/2010 1747   APPEARANCEUR CLEAR 12/07/2010 1747   LABSPEC 1.033 (H) 12/16/2010 1357   PHURINE 5.0 12/16/2010 1357   GLUCOSEU 250 (A) 12/16/2010 1357   HGBUR MODERATE (A) 12/16/2010 1357   BILIRUBINUR SMALL (A) 12/16/2010 1357   KETONESUR 15 (A) 12/16/2010 1357    PROTEINUR >300 (A) 12/16/2010 1357   UROBILINOGEN 1.0 12/16/2010 1357   NITRITE POSITIVE (A) 12/16/2010 1357   LEUKOCYTESUR SMALL (A) 12/16/2010 1357     RADIOLOGY: Dg Chest 2 View  Result Date: 09/20/2017 CLINICAL DATA:  Intermittent left-sided chest pain with dyspnea since Friday. EXAM: CHEST  2 VIEW COMPARISON:  02/14/2011 FINDINGS: Heart is top-normal in size with aortic atherosclerosis. Patient is status post median sternotomy and CABG. Scarring is seen at the right lung base with atelectasis. Chronic mild pleural thickening along the periphery of the lungs bilaterally versus chronic loculated fluid. No acute pneumonic consolidation, effusion or pneumothorax. IMPRESSION: Scarring and atelectasis at the right lung base. No active pulmonary disease. Chronic bilateral pleural thickening versus fluid loculations unchanged on the right and more apparent on the left since 2012. Electronically Signed   By: Ashley Royalty M.D.   On: 09/20/2017 17:24    EKG: Orders placed or performed during the hospital encounter of 09/20/17  . EKG 12-Lead  . EKG 12-Lead  . ED EKG within 10 minutes  . ED EKG within 10 minutes    IMPRESSION AND PLAN:  1.  Chest pain, will rule out ACS.  Continue to monitor patient on telemetry and follow troponin level.  Cardiology is consulted for further evaluation and treatment. 2.  Atrial fibrillation, rate controlled.  It is unclear if this is new onset.  We will continue beta-blockers.  We will start anticoagulation with heparin IV.  Given his risk factors, patient requires long-term anticoagulation.  We will check 2D echo. 3. Status post kidney transplant in 2016, for ESRD, secondary to diabetes and hypertension. Stable, continue to monitor kidney function closely and avoid nephrotoxic medications.  Continue Prograf and follow-up with transplant team, as outpatient. 4. DM2, stable. Cont insulin tx and monitor gluc levels ACHS. 5. CAD, s/p MI and CABG in 2007. 6.  Hypertension, stable continue home medications.   All the records are reviewed and case discussed with ED provider. Management plans discussed with the patient, family and they are in agreement.  CODE STATUS:    Code Status Orders  (From admission, onward)        Start     Ordered   09/20/17 2242  Full code  Continuous     09/20/17 2241    Code Status History    Date Active Date Inactive Code Status Order ID Comments User Context   This patient has a current code status but no historical code status.       TOTAL TIME TAKING CARE OF THIS PATIENT: 40 minutes.    Amelia Jo M.D on 09/20/2017 at 11:00 PM  Between 7am to 6pm - Pager - [phone]  After 6pm go to [email or web address] - password [email or web address] [facility]  Office  [phone]  CC: Primary care physician; Lianne Bushy, MD

## 2017-09-21 ENCOUNTER — Inpatient Hospital Stay (HOSPITAL_BASED_OUTPATIENT_CLINIC_OR_DEPARTMENT_OTHER)
Admit: 2017-09-21 | Discharge: 2017-09-21 | Disposition: A | Discharge status: Home / Self Care | Payer: Medicare Other | Attending: Internal Medicine | Admitting: Internal Medicine

## 2017-09-21 DIAGNOSIS — I4891 Unspecified atrial fibrillation: Secondary | ICD-10-CM

## 2017-09-21 DIAGNOSIS — R079 Chest pain, unspecified: Secondary | ICD-10-CM | POA: Diagnosis not present

## 2017-09-21 LAB — CBC
HEMATOCRIT: 44.4 % (ref 40.0–52.0)
Hemoglobin: 15 g/dL (ref 13.0–18.0)
MCH: 29.7 pg (ref 26.0–34.0)
MCHC: 33.8 g/dL (ref 32.0–36.0)
MCV: 87.7 fL (ref 80.0–100.0)
PLATELETS: 153 10*3/uL (ref 150–440)
RBC: 5.06 MIL/uL (ref 4.40–5.90)
RDW: 13.4 % (ref 11.5–14.5)
WBC: 5.2 10*3/uL (ref 3.8–10.6)

## 2017-09-21 LAB — BASIC METABOLIC PANEL
Anion gap: 10 (ref 5–15)
BUN: 27 mg/dL — AB (ref 6–20)
CHLORIDE: 104 mmol/L (ref 101–111)
CO2: 23 mmol/L (ref 22–32)
CREATININE: 1.25 mg/dL — AB (ref 0.61–1.24)
Calcium: 8.8 mg/dL — ABNORMAL LOW (ref 8.9–10.3)
GFR calc Af Amer: >60 mL/min (ref >60)
GFR calc non Af Amer: 60 mL/min — ABNORMAL LOW (ref >60)
GLUCOSE: 189 mg/dL — AB (ref 65–99)
POTASSIUM: 3.6 mmol/L (ref 3.5–5.1)
Sodium: 137 mmol/L (ref 135–145)

## 2017-09-21 LAB — GLUCOSE, CAPILLARY: GLUCOSE-CAPILLARY: 147 mg/dL — AB (ref 65–99)

## 2017-09-21 LAB — MRSA PCR SCREENING: MRSA by PCR: NEGATIVE

## 2017-09-21 LAB — HEPARIN LEVEL (UNFRACTIONATED): Heparin Unfractionated: 0.89 IU/mL — ABNORMAL HIGH (ref 0.30–0.70)

## 2017-09-21 MED ORDER — MYCOPHENOLATE MOFETIL 250 MG PO CAPS
500.0000 mg | ORAL_CAPSULE | Freq: Two times a day (BID) | ORAL | Status: DC
  Filled 2017-09-21: qty 2

## 2017-09-21 NOTE — Progress Notes (Addendum)
ANTICOAGULATION CONSULT NOTE - Initial Consult  Pharmacy Consult for heparin drip Indication: atrial fibrillation  No Known Allergies  Patient Measurements: Height: 5\' 8"  (172.7 cm) Weight: 171 lb 9.6 oz (77.8 kg) IBW/kg (Calculated) : 68.4 Heparin Dosing Weight: 77 kg  Vital Signs: Temp: 97.5 F (36.4 C) (02/19 2251) Temp Source: Oral (02/19 2251) BP: 127/66 (02/19 2254) Pulse Rate: 70 (02/19 2254)  Labs: Recent Labs    09/20/17 1631 09/20/17 1932 09/20/17 2214  HGB 16.6  --   --   HCT 48.4  --   --   PLT 183  --   --   APTT  --   --  29  LABPROT  --   --  14.4  INR  --   --  1.13  CREATININE 1.24  --   --   TROPONINI <0.03 <0.03  --     Estimated Creatinine Clearance: 59 mL/min (by C-G formula based on SCr of 1.24 mg/dL).   Medical History: Past Medical History:  Diagnosis Date  . Arthritis   . Chronic kidney disease    KIDNEY TRANSPLANT  . Coronary artery disease   . Diabetes mellitus without complication ([place])   . Myocardial infarction ([facility])     Medications:  No anticoagulation in PTA meds.  Assessment:  Goal of Therapy:  Heparin level 0.3-0.7 units/ml Monitor platelets by anticoagulation protocol: Yes   Plan:  4600 unit bolus and initial rate of 1200 units/hr. First heparin level 6 hours after start of infusion.  2/20 0500 heparin level 0.89. Decrease to 1050 units/hr and recheck in 6 hours.  Suly Vukelich S 09/21/2017,12:22 AM

## 2017-09-21 NOTE — Consult Note (Signed)
[name] is a 62 y.o. male  [id number]  Primary Cardiologist: Dr. Neoma Laming Reason for Consultation: Chest pain  HPI:62yo Hispanic male with a history of stable CAD and 2 vessel CABG in 2007, CHF history of EF 48%, CKD, and diabetes. He had an episode of chest pain last night which was relived by nitroglycerine. He was admitted for observation. Troponins have been negative.     Review of Systems: Feeling very well. No chest pain or shortness of breath.    Past Medical History:  Diagnosis Date  . Arthritis   . Chronic kidney disease    KIDNEY TRANSPLANT  . Coronary artery disease   . Diabetes mellitus without complication ([place])   . Myocardial infarction [facility])     Medications Prior to Admission  Medication Sig Dispense Refill  . acetaminophen (TYLENOL) 325 MG tablet Take 650 mg by mouth every 6 (six) hours as needed.    [name] amLODipine (NORVASC) 10 MG tablet Take 10 mg by mouth daily.     [name] atorvastatin (LIPITOR) 40 MG tablet 40 mg daily at 6 PM.     . carvedilol (COREG) 3.125 MG tablet 2 (two) times daily with a meal.     . chlorthalidone (HYGROTON) 25 MG tablet Take 25 mg by mouth daily.     [name] HUMALOG KWIKPEN 100 UNIT/ML KiwkPen Inject 10-12 Units into the skin 3 (three) times daily. Inject 10u daily at breakfast, 10u at lunch and up to 12u at supper per SSI    . LANTUS SOLOSTAR 100 UNIT/ML Solostar Pen Inject 24 Units into the skin at bedtime.     . nitroGLYCERIN (NITROLINGUAL) 0.4 MG/SPRAY spray     . PROGRAF 1 MG capsule Take 1 mg by mouth 2 (two) times daily.     . tamsulosin (FLOMAX) 0.4 MG CAPS capsule Take 0.4 mg by mouth 2 (two) times daily.     . silver sulfADIAZINE (SILVADENE) 1 % cream Apply 1 application topically daily. (Patient not taking: Reported on 09/20/2017) 50 g 0     . amLODipine  10 mg Oral Daily  . atorvastatin  40 mg Oral q1800  . carvedilol  3.125 mg Oral BID WC  . chlorthalidone  25 mg Oral Daily  . docusate sodium  100 mg Oral BID  .  insulin aspart  0-5 Units Subcutaneous QHS  . insulin aspart  0-9 Units Subcutaneous TID WC  . insulin glargine  24 Units Subcutaneous QHS  . mycophenolate  500 mg Oral BID  . tacrolimus  1 mg Oral BID  . tamsulosin  0.4 mg Oral BID    Infusions: . heparin 1,050 Units/hr (09/21/17 0631)    No Known Allergies  Social History   Socioeconomic History  . Marital status: Married    Spouse name: Not on file  . Number of children: Not on file  . Years of education: Not on file  . Highest education level: Not on file  Social Needs  . Financial resource strain: Not on file  . Food insecurity - worry: Not on file  . Food insecurity - inability: Not on file  . Transportation needs - medical: Not on file  . Transportation needs - non-medical: Not on file  Occupational History  . Not on file  Tobacco Use  . Smoking status: Never Smoker  . Smokeless tobacco: Never Used  Substance and Sexual Activity  . Alcohol use: No    Alcohol/week: 0.0 oz  . Drug use: No  .  Sexual activity: Not on file  Other Topics Concern  . Not on file  Social History Narrative  . Not on file    Family History  Problem Relation Age of Onset  . Drug abuse Sister   . Drug abuse Brother     PHYSICAL EXAM: Vitals:   09/20/17 2254 09/21/17 0442  BP: 127/66 (!) 99/55  Pulse: 70 75  Resp:    Temp:  97.8 F (36.6 C)  SpO2:  97%     Intake/Output Summary (Last 24 hours) at 09/21/2017 1052 Last data filed at 09/21/2017 1006 Gross per 24 hour  Intake 303.8 ml  Output 600 ml  Net -296.2 ml    General:  Well appearing. No respiratory difficulty HEENT: normal Neck: supple. no JVD. Carotids 2+ bilat; no bruits. No lymphadenopathy or thryomegaly appreciated. Cor: PMI nondisplaced. Regular rate & rhythm. No rubs, gallops or murmurs. Lungs: clear Abdomen: soft, nontender, nondistended. No hepatosplenomegaly. No bruits or masses. Good bowel sounds. Extremities: no cyanosis, clubbing, rash, edema Neuro:  alert & oriented x 3, cranial nerves grossly intact. moves all 4 extremities w/o difficulty. Affect pleasant.  ECG: Afib rate controlled 81bpm  Results for orders placed or performed during the hospital encounter of 09/20/17 (from the past 24 hour(s))  Basic metabolic panel     Status: Abnormal   Collection Time: 09/20/17  4:31 PM  Result Value Ref Range   Sodium 134 (L) 135 - 145 mmol/L   Potassium 4.3 3.5 - 5.1 mmol/L   Chloride 100 (L) 101 - 111 mmol/L   CO2 24 22 - 32 mmol/L   Glucose, Bld 141 (H) 65 - 99 mg/dL   BUN 30 (H) 6 - 20 mg/dL   Creatinine, Ser 1.24 0.61 - 1.24 mg/dL   Calcium 9.0 8.9 - 10.3 mg/dL   GFR calc non Af Amer >60 >60 mL/min   GFR calc Af Amer >60 >60 mL/min   Anion gap 10 5 - 15  CBC     Status: None   Collection Time: 09/20/17  4:31 PM  Result Value Ref Range   WBC 8.2 3.8 - 10.6 K/uL   RBC 5.51 4.40 - 5.90 MIL/uL   Hemoglobin 16.6 13.0 - 18.0 g/dL   HCT 48.4 40.0 - 52.0 %   MCV 87.8 80.0 - 100.0 fL   MCH 30.0 26.0 - 34.0 pg   MCHC 34.2 32.0 - 36.0 g/dL   RDW 13.6 11.5 - 14.5 %   Platelets 183 150 - 440 K/uL  Troponin I     Status: None   Collection Time: 09/20/17  4:31 PM  Result Value Ref Range   Troponin I <0.03 <0.03 ng/mL  Hepatic function panel     Status: Abnormal   Collection Time: 09/20/17  4:31 PM  Result Value Ref Range   Total Protein 7.6 6.5 - 8.1 g/dL   Albumin 4.3 3.5 - 5.0 g/dL   AST 25 15 - 41 U/L   ALT 19 17 - 63 U/L   Alkaline Phosphatase 55 38 - 126 U/L   Total Bilirubin 1.0 0.3 - 1.2 mg/dL   Bilirubin, Direct <0.1 (L) 0.1 - 0.5 mg/dL   Indirect Bilirubin NOT CALCULATED 0.3 - 0.9 mg/dL  Lipase, blood     Status: None   Collection Time: 09/20/17  4:31 PM  Result Value Ref Range   Lipase 25 11 - 51 U/L  Troponin I     Status: None   Collection Time: 09/20/17  7:32  PM  Result Value Ref Range   Troponin I <0.03 <0.03 ng/mL  Protime-INR     Status: None   Collection Time: 09/20/17 10:14 PM  Result Value Ref Range    Prothrombin Time 14.4 11.4 - 15.2 seconds   INR 1.13   APTT     Status: None   Collection Time: 09/20/17 10:14 PM  Result Value Ref Range   aPTT 29 24 - 36 seconds  Glucose, capillary     Status: Abnormal   Collection Time: 09/20/17 10:53 PM  Result Value Ref Range   Glucose-Capillary 208 (H) 65 - 99 mg/dL   Comment 1 Notify RN   MRSA PCR Screening     Status: None   Collection Time: 09/21/17 12:22 AM  Result Value Ref Range   MRSA by PCR NEGATIVE NEGATIVE  Basic metabolic panel     Status: Abnormal   Collection Time: 09/21/17  5:10 AM  Result Value Ref Range   Sodium 137 135 - 145 mmol/L   Potassium 3.6 3.5 - 5.1 mmol/L   Chloride 104 101 - 111 mmol/L   CO2 23 22 - 32 mmol/L   Glucose, Bld 189 (H) 65 - 99 mg/dL   BUN 27 (H) 6 - 20 mg/dL   Creatinine, Ser 1.25 (H) 0.61 - 1.24 mg/dL   Calcium 8.8 (L) 8.9 - 10.3 mg/dL   GFR calc non Af Amer 60 (L) >60 mL/min   GFR calc Af Amer >60 >60 mL/min   Anion gap 10 5 - 15  CBC     Status: None   Collection Time: 09/21/17  5:10 AM  Result Value Ref Range   WBC 5.2 3.8 - 10.6 K/uL   RBC 5.06 4.40 - 5.90 MIL/uL   Hemoglobin 15.0 13.0 - 18.0 g/dL   HCT 44.4 40.0 - 52.0 %   MCV 87.7 80.0 - 100.0 fL   MCH 29.7 26.0 - 34.0 pg   MCHC 33.8 32.0 - 36.0 g/dL   RDW 13.4 11.5 - 14.5 %   Platelets 153 150 - 440 K/uL  Heparin level (unfractionated)     Status: Abnormal   Collection Time: 09/21/17  5:10 AM  Result Value Ref Range   Heparin Unfractionated 0.89 (H) 0.30 - 0.70 IU/mL  Glucose, capillary     Status: Abnormal   Collection Time: 09/21/17  7:31 AM  Result Value Ref Range   Glucose-Capillary 147 (H) 65 - 99 mg/dL   Dg Chest 2 View  Result Date: 09/20/2017 CLINICAL DATA:  Intermittent left-sided chest pain with dyspnea since Friday. EXAM: CHEST  2 VIEW COMPARISON:  02/14/2011 FINDINGS: Heart is top-normal in size with aortic atherosclerosis. Patient is status post median sternotomy and CABG. Scarring is seen at the right lung base  with atelectasis. Chronic mild pleural thickening along the periphery of the lungs bilaterally versus chronic loculated fluid. No acute pneumonic consolidation, effusion or pneumothorax. IMPRESSION: Scarring and atelectasis at the right lung base. No active pulmonary disease. Chronic bilateral pleural thickening versus fluid loculations unchanged on the right and more apparent on the left since 2012. Electronically Signed   By: Ashley Royalty M.D.   On: 09/20/2017 17:24     ASSESSMENT AND PLAN: Chest pain relieved with nitroglycerine with no evidence of infarction. Troponin is negative. Apparent new onset of asymptomatic atrial fibrillation, rate controlled. Will arrange for anticoagulation with Eliquis 5mg  BID,  medication sent to pharmacy. Advise close outpatient follow up with Dr. Humphrey Rolls Tuesday at 10am. Will review  echo results with the patient at that time and make any medication changes to optimize therapy.  Jake Bathe, NP-C Cell: [phone]

## 2017-09-21 NOTE — Progress Notes (Signed)
   09/21/17 1000  Clinical Encounter Type  Visited With Patient;Family;Patient and family together  Visit Type Initial;Spiritual support;Other (Comment) (HCPOA)  Referral From Nurse  Spiritual Encounters  Spiritual Needs Literature;Emotional  HCPOA/AD materials dropped off with patient.  [place] reviewed materials briefly.  Patient preferred [employer].

## 2017-09-21 NOTE — Progress Notes (Signed)
*  PRELIMINARY RESULTS* Echocardiogram 2D Echocardiogram has been performed.  [name] 09/21/2017, 10:40 AM

## 2017-09-21 NOTE — Progress Notes (Addendum)
Discharge instructions explained to pt/ per Dr.  Manuella Ghazi, pt is not being discharged with any blood thinner or any change to home med/ verbalized an understanding/ iv and tele removed/ pt refused wheelchair/ ambulated off unit.

## 2017-09-21 NOTE — Discharge Instructions (Signed)

## 2017-09-22 LAB — ECHOCARDIOGRAM COMPLETE
Height: 68 in
WEIGHTICAEL: 2705.6 [oz_av]

## 2017-09-22 LAB — HIV ANTIBODY (ROUTINE TESTING W REFLEX): HIV SCREEN 4TH GENERATION: NONREACTIVE

## 2017-09-22 NOTE — Care Management Obs Status (Addendum)
[place] NOTIFICATION   Patient Details  Name: [name] MRN: [id number] Date of Birth: October 24, 1955   Medicare Observation Status Notification Given:  No  Patient was listed on my census ar [place] for primary payor and age 62 which would not have put him on my radar to watch for code [address]  Katrina Stack, RN 09/22/2017, 8:05 AM

## 2017-09-22 NOTE — Care Management CC44 (Signed)
N       Condition Code 44 Documentation Completed  Patient Details  Name: [name] MRN: [id number] Date of Birth: 12/06/1955   Condition Code 22 given:   No Patient signature on Condition Code 44 notice:   No Documentation of 2 MD's agreement:    Code 44 added to claim:     Patient had [employer] as primary payor and under the age of 59. CM was in a family meeting when  code 38 text was sent. Patient had discharged when CM went to provide notice  Katrina Stack, RN 09/22/2017, 8:06 AM

## 2017-09-24 NOTE — Discharge Summary (Signed)
[place] [facility] [place] NAME: [name]    MR#:  [id number]  DATE OF BIRTH:  [birth year]  DATE OF ADMISSION:  09/20/2017   ADMITTING PHYSICIAN: Amelia Jo, MD  DATE OF DISCHARGE: 09/21/2017 12:09 PM  PRIMARY CARE PHYSICIAN: Lianne Bushy, MD   ADMISSION DIAGNOSIS:  Chest pain, unspecified type [R07.9] DISCHARGE DIAGNOSIS:  Active Problems:   Chest pain  SECONDARY DIAGNOSIS:   Past Medical History:  Diagnosis Date  . Arthritis   . Chronic kidney disease    KIDNEY TRANSPLANT  . Coronary artery disease   . Diabetes mellitus without complication ([place])   . Myocardial infarction [facility])    HOSPITAL COURSE:  [name]  is a 62 y.o. male with a known history of coronary artery disease, status post MI, status post CABG in 2007.  Patient also has history of diabetes type 2, complicated with chronic kidney disease for which patient required kidney transplant in 2016. Patient presented to emergency room for intermittent chest pain, at the left side of the chest, without any radiation, going on for the past 3 days.  He describes the pain as tightness,  7 out of 10 in severity, associated with occasional palpitations.  The pain improves with nitroglycerin and does not seem to be affected by exertion.  Of note, patient mentioned that he has been under a lot of emotional stress in the past week, as he witnessed the death one of his close relatives.  1.  Chest pain: ruled out ACS with neg serial troponins. Cardio recommends outpt f/up 2.  Atrial fibrillation, rate controlled.  It is unclear if this is new onset. continue beta-blockers for rate control. Please note I had discussion with Dr Laurelyn Sickle APP Erasmo Downer) over phone who recommends no anticoagulation at D/C and they will follow up next Tuesday in office at 10 am to decide need for anticoagulation and any further eval. They will also review echo results with patient at that time as echo  results were still not available at the time of discharge.  3. Status post kidney transplant in 2016, for ESRD, secondary to diabetes and hypertension. Stable.  Continue Prograf and follow-up with transplant team, as outpatient. 4. DM2, stable 5. CAD, s/p MI and CABG in 2007. 6. Hypertension, stable  DISCHARGE CONDITIONS:  stable CONSULTS OBTAINED:  Treatment Team:  Dionisio David, MD DRUG ALLERGIES:  No Known Allergies DISCHARGE MEDICATIONS:   Allergies as of 09/21/2017   No Known Allergies     Medication List    TAKE these medications   acetaminophen 325 MG tablet Commonly known as:  TYLENOL Take 650 mg by mouth every 6 (six) hours as needed.   amLODipine 10 MG tablet Commonly known as:  NORVASC Take 10 mg by mouth daily.   atorvastatin 40 MG tablet Commonly known as:  LIPITOR 40 mg daily at 6 PM.   carvedilol 3.125 MG tablet Commonly known as:  COREG 2 (two) times daily with a meal.   chlorthalidone 25 MG tablet Commonly known as:  HYGROTON Take 25 mg by mouth daily.   HUMALOG KWIKPEN 100 UNIT/ML KiwkPen Generic drug:  insulin lispro Inject 10-12 Units into the skin 3 (three) times daily. Inject 10u daily at breakfast, 10u at lunch and up to 12u at supper per SSI   LANTUS SOLOSTAR 100 UNIT/ML Solostar Pen Generic drug:  Insulin Glargine Inject 24 Units into the skin at bedtime.   nitroGLYCERIN 0.4 MG/SPRAY spray Commonly  known as:  NITROLINGUAL   PROGRAF 1 MG capsule Generic drug:  tacrolimus Take 1 mg by mouth 2 (two) times daily.   silver sulfADIAZINE 1 % cream Commonly known as:  SILVADENE Apply 1 application topically daily.   tamsulosin 0.4 MG Caps capsule Commonly known as:  FLOMAX Take 0.4 mg by mouth 2 (two) times daily.        DISCHARGE INSTRUCTIONS:   DIET:  Cardiac diet DISCHARGE CONDITION:  Good ACTIVITY:  Activity as tolerated OXYGEN:  Home Oxygen: No.  Oxygen Delivery: room air DISCHARGE LOCATION:  home   If you  experience worsening of your admission symptoms, develop shortness of breath, life threatening emergency, suicidal or homicidal thoughts you must seek medical attention immediately by calling 911 or calling your MD immediately  if symptoms less severe.  You Must read complete instructions/literature along with all the possible adverse reactions/side effects for all the Medicines you take and that have been prescribed to you. Take any new Medicines after you have completely understood and accpet all the possible adverse reactions/side effects.   Please note  You were cared for by a hospitalist during your hospital stay. If you have any questions about your discharge medications or the care you received while you were in the hospital after you are discharged, you can call the unit and asked to speak with the hospitalist on call if the hospitalist that took care of you is not available. Once you are discharged, your primary care physician will handle any further medical issues. Please note that NO REFILLS for any discharge medications will be authorized once you are discharged, as it is imperative that you return to your primary care physician (or establish a relationship with a primary care physician if you do not have one) for your aftercare needs so that they can reassess your need for medications and monitor your lab values.    On the day of Discharge:  VITAL SIGNS:  Blood pressure (!) 108/57, pulse 80, temperature 97.8 F (36.6 C), temperature source Oral, resp. rate 18, height 5\' 8"  (1.727 m), weight 76.7 kg (169 lb 1.6 oz), SpO2 95 %. PHYSICAL EXAMINATION:  GENERAL:  62 y.o.-year-old patient lying in the bed with no acute distress.  EYES: Pupils equal, round, reactive to light and accommodation. No scleral icterus. Extraocular muscles intact.  HEENT: Head atraumatic, normocephalic. Oropharynx and nasopharynx clear.  NECK:  Supple, no jugular venous distention. No thyroid enlargement, no  tenderness.  LUNGS: Normal breath sounds bilaterally, no wheezing, rales,rhonchi or crepitation. No use of accessory muscles of respiration.  CARDIOVASCULAR: S1, S2 normal. No murmurs, rubs, or gallops.  ABDOMEN: Soft, non-tender, non-distended. Bowel sounds present. No organomegaly or mass.  EXTREMITIES: No pedal edema, cyanosis, or clubbing.  NEUROLOGIC: Cranial nerves II through XII are intact. Muscle strength 5/5 in all extremities. Sensation intact. Gait not checked.  PSYCHIATRIC: The patient is alert and oriented x 3.  SKIN: No obvious rash, lesion, or ulcer.  DATA REVIEW:   CBC Recent Labs  Lab 09/21/17 0510  WBC 5.2  HGB 15.0  HCT 44.4  PLT 153    Chemistries  Recent Labs  Lab 09/20/17 1631 09/21/17 0510  NA 134* 137  K 4.3 3.6  CL 100* 104  CO2 24 23  GLUCOSE 141* 189*  BUN 30* 27*  CREATININE 1.24 1.25*  CALCIUM 9.0 8.8*  AST 25  --   ALT 19  --   ALKPHOS 55  --   BILITOT 1.0  --  Follow-up Information    Lianne Bushy, MD. Schedule an appointment as soon as possible for a visit in 1 week(s).   Specialty:  Family Medicine Contact information: [place] [address] [phone]        Dionisio David, MD. Daphane Shepherd on 09/27/2017.   Specialty:  Cardiology Why:  @ 10 am as scheduled Contact information: [place] [address] [phone]            Management plans discussed with the patient, family and they are in agreement.  CODE STATUS: Prior   TOTAL TIME TAKING CARE OF THIS PATIENT: 45 minutes.    Max Sane M.D on 09/24/2017 at 4:26 PM  Between 7am to 6pm - Pager - [phone]  After 6pm go to [email or web address] - Proofreader  [facility]  Office  [phone]  CC: Primary care physician; Lianne Bushy, MD   Note: This dictation was prepared with Dragon dictation along with smaller phrase technology. Any transcriptional errors that result from this process are  unintentional.

## 2017-09-26 ENCOUNTER — Telehealth: Payer: Self-pay

## 2017-09-26 NOTE — Telephone Encounter (Signed)
Flagged on EMMI report for being unsure if they received discharge papers, not knowing who to contact for any changes in condition, and for not having transportation.  Per EMMI report, patient speaks [place], though preferred language listed as Spanish in [facility].  Contacted patient through use of Language Line [phone] access code: [phone]).  InterpreterMaryellen Pile, [place]: [phone].  Patient states he received his discharge paperwork and has follow up appointments with his PCP (Dr. Nicki Reaper) and Dr. Neoma Laming.  He is aware to reach out to them regarding changes in his condition. Patient drives and reports he does not have any transportation issues.  No further concerns.

## 2017-10-06 MED ORDER — LOSARTAN 50 MG TABLET
ORAL_TABLET | 3 refills | 0 days | Status: CP
Start: 2017-10-06 — End: 2018-09-26

## 2017-10-18 ENCOUNTER — Encounter: Admit: 2017-10-18 | Discharge: 2017-10-19 | Payer: MEDICARE

## 2017-10-18 DIAGNOSIS — Z940 Kidney transplant status: Principal | ICD-10-CM

## 2017-10-25 MED ORDER — CELLCEPT 250 MG CAPSULE: 500 mg | capsule | Freq: Two times a day (BID) | 3 refills | 0 days | Status: AC

## 2017-10-25 MED ORDER — CELLCEPT 250 MG CAPSULE
Freq: Two times a day (BID) | ORAL | 0 refills | 0.00000 days | Status: CP
Start: 2017-10-25 — End: 2017-10-25

## 2017-11-22 ENCOUNTER — Encounter: Admit: 2017-11-22 | Discharge: 2017-11-22 | Payer: MEDICARE | Attending: Nephrology | Primary: Nephrology

## 2017-11-22 ENCOUNTER — Encounter: Admit: 2017-11-22 | Discharge: 2017-11-22 | Payer: MEDICARE

## 2017-11-22 ENCOUNTER — Non-Acute Institutional Stay: Admit: 2017-11-22 | Discharge: 2017-11-22 | Payer: MEDICARE

## 2017-11-22 DIAGNOSIS — Z940 Kidney transplant status: Principal | ICD-10-CM

## 2017-11-22 DIAGNOSIS — Z0000 Encounter for general adult medical examination without abnormal findings: Secondary | ICD-10-CM

## 2017-11-23 MED ORDER — CELLCEPT 250 MG CAPSULE
ORAL_CAPSULE | Freq: Two times a day (BID) | ORAL | 3 refills | 0.00000 days
Start: 2017-11-23 — End: 2018-11-14

## 2017-12-22 ENCOUNTER — Other Ambulatory Visit: Admit: 2017-12-22 | Discharge: 2017-12-23 | Payer: MEDICARE

## 2017-12-22 DIAGNOSIS — Z0000 Encounter for general adult medical examination without abnormal findings: Secondary | ICD-10-CM

## 2017-12-22 DIAGNOSIS — Z940 Kidney transplant status: Principal | ICD-10-CM

## 2018-02-03 ENCOUNTER — Encounter: Admit: 2018-02-03 | Discharge: 2018-02-04 | Payer: MEDICARE

## 2018-02-03 DIAGNOSIS — Z0000 Encounter for general adult medical examination without abnormal findings: Secondary | ICD-10-CM

## 2018-02-03 DIAGNOSIS — Z940 Kidney transplant status: Principal | ICD-10-CM

## 2018-04-04 ENCOUNTER — Encounter: Admit: 2018-04-04 | Discharge: 2018-04-04 | Payer: MEDICARE

## 2018-04-04 ENCOUNTER — Encounter: Admit: 2018-04-04 | Discharge: 2018-04-04 | Payer: MEDICARE | Attending: Nephrology | Primary: Nephrology

## 2018-04-04 ENCOUNTER — Non-Acute Institutional Stay: Admit: 2018-04-04 | Discharge: 2018-04-04 | Payer: MEDICARE

## 2018-04-04 DIAGNOSIS — Z794 Long term (current) use of insulin: Secondary | ICD-10-CM

## 2018-04-04 DIAGNOSIS — Z0000 Encounter for general adult medical examination without abnormal findings: Secondary | ICD-10-CM

## 2018-04-04 DIAGNOSIS — Z940 Kidney transplant status: Principal | ICD-10-CM

## 2018-04-04 DIAGNOSIS — Z114 Encounter for screening for human immunodeficiency virus [HIV]: Secondary | ICD-10-CM

## 2018-04-04 DIAGNOSIS — E1121 Type 2 diabetes mellitus with diabetic nephropathy: Secondary | ICD-10-CM

## 2018-04-04 DIAGNOSIS — E559 Vitamin D deficiency, unspecified: Secondary | ICD-10-CM

## 2018-04-20 ENCOUNTER — Ambulatory Visit: Admit: 2018-04-20 | Discharge: 2018-04-20 | Payer: MEDICARE

## 2018-04-20 ENCOUNTER — Encounter
Admit: 2018-04-20 | Discharge: 2018-04-20 | Payer: MEDICARE | Attending: Certified Registered" | Primary: Certified Registered"

## 2018-04-20 DIAGNOSIS — Z1211 Encounter for screening for malignant neoplasm of colon: Principal | ICD-10-CM

## 2018-06-05 ENCOUNTER — Non-Acute Institutional Stay: Admit: 2018-06-05 | Discharge: 2018-06-06 | Payer: MEDICARE

## 2018-06-05 DIAGNOSIS — Z940 Kidney transplant status: Principal | ICD-10-CM

## 2018-06-13 ENCOUNTER — Ambulatory Visit: Payer: Medicare Other | Admitting: Podiatry

## 2018-06-13 ENCOUNTER — Encounter: Payer: Self-pay | Admitting: Podiatry

## 2018-06-13 DIAGNOSIS — E0843 Diabetes mellitus due to underlying condition with diabetic autonomic (poly)neuropathy: Secondary | ICD-10-CM | POA: Diagnosis not present

## 2018-06-13 DIAGNOSIS — M2042 Other hammer toe(s) (acquired), left foot: Secondary | ICD-10-CM | POA: Diagnosis not present

## 2018-06-13 DIAGNOSIS — E559 Vitamin D deficiency, unspecified: Secondary | ICD-10-CM | POA: Insufficient documentation

## 2018-06-13 DIAGNOSIS — M2041 Other hammer toe(s) (acquired), right foot: Secondary | ICD-10-CM | POA: Diagnosis not present

## 2018-06-13 DIAGNOSIS — L608 Other nail disorders: Secondary | ICD-10-CM

## 2018-06-13 DIAGNOSIS — I2510 Atherosclerotic heart disease of native coronary artery without angina pectoris: Secondary | ICD-10-CM | POA: Insufficient documentation

## 2018-06-13 DIAGNOSIS — J984 Other disorders of lung: Secondary | ICD-10-CM | POA: Insufficient documentation

## 2018-06-13 DIAGNOSIS — E7800 Pure hypercholesterolemia, unspecified: Secondary | ICD-10-CM | POA: Insufficient documentation

## 2018-06-13 NOTE — Progress Notes (Signed)
   HPI: 62 year old male with PMHx of T2DM presenting today with a chief complaint of a detached left great toenail that occurred about one week ago. He states the nail was loose and when he pulled it, it became detached. He has not done anything for treatment and denies modifying factors. He denies any pain. Patient is here for further evaluation and treatment.   Past Medical History:  Diagnosis Date  . Arthritis   . Chronic kidney disease    KIDNEY TRANSPLANT  . Coronary artery disease   . Diabetes mellitus without complication ([place])   . Myocardial infarction [facility])       Objective: Physical Exam General: The patient is alert and oriented x3 in no acute distress.  Dermatology: Loss of left great toenail noted. Skin is cool, dry and supple bilateral lower extremities. Negative for open lesions or macerations.  Vascular: Palpable pedal pulses bilaterally. No edema or erythema noted. Capillary refill within normal limits.  Neurological: Epicritic and protective threshold grossly intact bilaterally.   Musculoskeletal Exam: All pedal and ankle joints range of motion within normal limits bilateral. Muscle strength 5/5 in all groups bilateral. Hammertoe contracture deformity noted to digits 1-5 of the bilateral feet.  Assessment: 1. Loss of toenail left hallux 2. DM with polyneuropathy 3. 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 Patient evaluated.  2. Recommended good foot hygiene.  3. Importance of controlling blood glucose levels discussed with patient.  4. Appointment with Liliane Channel, Pedorthist, for DM shoes and insoles. 5. Return to clinic as needed.       Edrick Kins, DPM [facility]  Dr. Edrick Kins, DPM    [address], [place] [address]                Office [phone]  Fax [phone]

## 2018-06-28 ENCOUNTER — Ambulatory Visit: Payer: Medicare Other | Admitting: Orthotics

## 2018-06-28 DIAGNOSIS — M2042 Other hammer toe(s) (acquired), left foot: Secondary | ICD-10-CM

## 2018-06-28 DIAGNOSIS — E0843 Diabetes mellitus due to underlying condition with diabetic autonomic (poly)neuropathy: Secondary | ICD-10-CM

## 2018-06-28 DIAGNOSIS — M2041 Other hammer toe(s) (acquired), right foot: Secondary | ICD-10-CM

## 2018-06-28 NOTE — Progress Notes (Signed)
patietn came in to discuss placement of neuroma pad; f/o has not yet been glued down in fromt.

## 2018-07-12 ENCOUNTER — Encounter: Admit: 2018-07-12 | Discharge: 2018-07-13 | Payer: MEDICARE

## 2018-07-12 DIAGNOSIS — Z940 Kidney transplant status: Principal | ICD-10-CM

## 2018-08-25 ENCOUNTER — Ambulatory Visit (INDEPENDENT_AMBULATORY_CARE_PROVIDER_SITE_OTHER): Payer: Medicare Other | Admitting: Vascular Surgery

## 2018-08-25 ENCOUNTER — Ambulatory Visit (INDEPENDENT_AMBULATORY_CARE_PROVIDER_SITE_OTHER): Payer: Medicare Other

## 2018-08-25 ENCOUNTER — Encounter (INDEPENDENT_AMBULATORY_CARE_PROVIDER_SITE_OTHER): Payer: Self-pay | Admitting: Vascular Surgery

## 2018-08-25 VITALS — BP 104/59 | HR 52 | Resp 12 | Ht 68.0 in | Wt 171.0 lb

## 2018-08-25 DIAGNOSIS — E1159 Type 2 diabetes mellitus with other circulatory complications: Secondary | ICD-10-CM

## 2018-08-25 DIAGNOSIS — I10 Essential (primary) hypertension: Secondary | ICD-10-CM | POA: Diagnosis not present

## 2018-08-25 DIAGNOSIS — T829XXD Unspecified complication of cardiac and vascular prosthetic device, implant and graft, subsequent encounter: Secondary | ICD-10-CM | POA: Diagnosis not present

## 2018-08-25 DIAGNOSIS — Z940 Kidney transplant status: Secondary | ICD-10-CM

## 2018-08-25 NOTE — Assessment & Plan Note (Signed)
Duplex today shows only mildly elevated velocities at the confluence of the cephalic vein to the subclavian vein and at the shoulder which has not worsened since his previous studies. No intervention is required for this.  He is not needing to use the AV fistula at this point so that is good.  We can recheck it in a year.

## 2018-08-25 NOTE — Progress Notes (Signed)
MRN : [id number]  [name] is a 63 y.o. ([birth year]) male who presents with chief complaint of  Chief Complaint  Patient presents with  . Follow-up  .  History of Present Illness: Patient returns today in follow up of his AVF.  He got a transplant a couple of years ago now and has not needed to use the fistula since then.  It still has a good thrill.  He has no pain or problems with the access. Duplex today shows only mildly elevated velocities at the confluence of the cephalic vein to the subclavian vein and at the shoulder which has not worsened since his previous studies.  Current Outpatient Medications  Medication Sig Dispense Refill  . amLODipine (NORVASC) 10 MG tablet Take 10 mg by mouth daily.     [name] atorvastatin (LIPITOR) 40 MG tablet 40 mg daily at 6 PM.     . carvedilol (COREG) 3.125 MG tablet 2 (two) times daily with a meal.     . chlorthalidone (HYGROTON) 25 MG tablet Take 25 mg by mouth daily.     . Cholecalciferol (VITAMIN D3) 50 MCG (2000 UT) capsule Take by mouth.    [name] 5 MG TABS tablet     . HUMALOG KWIKPEN 100 UNIT/ML KiwkPen Inject 10-12 Units into the skin 3 (three) times daily. Inject 10u daily at breakfast, 10u at lunch and up to 12u at supper per SSI    . Insulin Pen Needle (ADVOCATE INSULIN PEN NEEDLES) 33G X 4 MM MISC To use with insulin pen 4 times per day. E 13.9    . LANTUS SOLOSTAR 100 UNIT/ML Solostar Pen Inject 24 Units into the skin at bedtime.     [name] losartan (COZAAR) 50 MG tablet     . mycophenolate (CELLCEPT) 250 MG capsule Take by mouth.    [name] omeprazole (PRILOSEC) 20 MG capsule Take 20 mg by mouth daily.    [name] PROGRAF 1 MG capsule Take 1 mg by mouth 2 (two) times daily.     . tamsulosin (FLOMAX) 0.4 MG CAPS capsule Take 0.4 mg by mouth 2 (two) times daily.     [name] aspirin EC 81 MG tablet TAKE ONE (1) TABLET BY MOUTH EVERY DAY     No current facility-administered medications for this visit.     Past Medical History:  Diagnosis Date    . Arthritis   . Chronic kidney disease    KIDNEY TRANSPLANT  . Coronary artery disease   . Diabetes mellitus without complication ([place])   . Myocardial infarction [facility])     Past Surgical History:  Procedure Laterality Date  . CATARACT EXTRACTION W/PHACO Right 02/03/2017   Procedure: CATARACT EXTRACTION PHACO AND INTRAOCULAR LENS PLACEMENT (IOC);  Surgeon: Leandrew Koyanagi, MD;  Location: [facility] ORS;  Service: Ophthalmology;  Laterality: Right;  [place] 00:35.8 AP% 12.8 CDE 4.57 Fluid lot # [id number]  . CORONARY ANGIOPLASTY     STENTS X 4  . CORONARY ARTERY BYPASS GRAFT    . HIP SURGERY    . KIDNEY TRANSPLANT     2016   Social History        Tobacco Use  . Smoking status: Never Smoker  . Smokeless tobacco: Never Used  Substance Use Topics  . Alcohol use: No    Alcohol/week: 0.0 oz  . Drug use: No    Family History No bleeding disorders or clotting disorders  No Known Allergies   REVIEW OF SYSTEMS (Negative unless checked)  Constitutional: [] ?  Weight loss  [] ?Fever  [] ?Chills Cardiac: [] ?Chest pain   [] ?Chest pressure   [] ?Palpitations   [] ?Shortness of breath when laying flat   [] ?Shortness of breath at rest   [x] ?Shortness of breath with exertion. Vascular:  [] ?Pain in legs with walking   [] ?Pain in legs at rest   [] ?Pain in legs when laying flat   [] ?Claudication   [] ?Pain in feet when walking  [] ?Pain in feet at rest  [] ?Pain in feet when laying flat   [] ?History of DVT   [] ?Phlebitis   [] ?Swelling in legs   [] ?Varicose veins   [] ?Non-healing ulcers Pulmonary:   [] ?Uses home oxygen   [] ?Productive cough   [] ?Hemoptysis   [] ?Wheeze  [] ?COPD   [] ?Asthma Neurologic:  [] ?Dizziness  [] ?Blackouts   [] ?Seizures   [] ?History of stroke   [] ?History of TIA  [] ?Aphasia   [] ?Temporary blindness   [] ?Dysphagia   [] ?Weakness or numbness in arms   [] ?Weakness or numbness in legs Musculoskeletal:  [x] ?Arthritis   [] ?Joint swelling   [] ?Joint pain   [] ?Low back pain Hematologic:   [] ?Easy bruising  [] ?Easy bleeding   [] ?Hypercoagulable state   [] ?Anemic   Gastrointestinal:  [] ?Blood in stool   [] ?Vomiting blood  [] ?Gastroesophageal reflux/heartburn   [] ?Abdominal pain Genitourinary:  [x] ?Chronic kidney disease   [] ?Difficult urination  [] ?Frequent urination  [] ?Burning with urination   [] ?Hematuria Skin:  [] ?Rashes   [] ?Ulcers   [] ?Wounds Psychological:  [] ?History of anxiety   [] ? History of major depression.    Physical Examination  BP (!) 104/59 (BP Location: Right Arm, Patient Position: Sitting)   Pulse (!) 52   Resp 12   Ht 5\' 8"  (1.727 m)   Wt 171 lb (77.6 kg)   BMI 26.00 kg/m  Gen:  WD/WN, NAD Head: [place]/AT, No temporalis wasting. Ear/Nose/Throat: Hearing grossly intact, nares w/o erythema or drainage Eyes: Conjunctiva clear. Sclera non-icteric Neck: Supple.  Trachea midline Pulmonary:  Good air movement, no use of accessory muscles.  Cardiac: RRR, no JVD Vascular: Good thrill in left upper arm AV fistula Vessel Right Left  Radial Palpable Palpable                           Musculoskeletal: M/S 5/5 throughout.  No deformity or atrophy.  Neurologic: Sensation grossly intact in extremities.  Symmetrical.  Speech is fluent.  Psychiatric: Judgment intact, Mood & affect appropriate for pt's clinical situation. Dermatologic: No rashes or ulcers noted.  No cellulitis or open wounds.       Labs No results found for this or any previous visit (from the past 2160 hour(s)).  Radiology No results found.  Assessment/Plan Diabetes (HCC) blood glucose control important in reducing the progression of atherosclerotic disease. Also, involved in wound healing. On appropriate medications.   Essential hypertension blood pressure control important in reducing the progression of atherosclerotic disease. On appropriate oral medications.  Kidney transplant recipient This is functioning well and he is no longer using the fistula.  The fistula could be  used for dialysis at any point going forward if there are troubles with the kidney transplant.  Complication of arteriovenous dialysis fistula Duplex today shows only mildly elevated velocities at the confluence of the cephalic vein to the subclavian vein and at the shoulder which has not worsened since his previous studies. No intervention is required for this.  He is not needing to use the AV fistula at this point so that is good.  We can recheck  it in a year.    Leotis Pain, MD  08/25/2018 12:13 PM    This note was created with Dragon medical transcription system.  Any errors from dictation are purely unintentional

## 2018-08-25 NOTE — Assessment & Plan Note (Signed)
This is functioning well and he is no longer using the fistula.  The fistula could be used for dialysis at any point going forward if there are troubles with the kidney transplant.

## 2018-08-31 MED ORDER — ATORVASTATIN 40 MG TABLET
ORAL_TABLET | 4 refills | 0 days | Status: CP
Start: 2018-08-31 — End: ?

## 2018-09-26 MED ORDER — LOSARTAN 50 MG TABLET
ORAL_TABLET | 3 refills | 0 days | Status: CP
Start: 2018-09-26 — End: ?

## 2018-10-09 DIAGNOSIS — Z0000 Encounter for general adult medical examination without abnormal findings: Principal | ICD-10-CM

## 2018-10-09 DIAGNOSIS — E559 Vitamin D deficiency, unspecified: Principal | ICD-10-CM

## 2018-10-09 DIAGNOSIS — E119 Type 2 diabetes mellitus without complications: Principal | ICD-10-CM

## 2018-10-09 DIAGNOSIS — Z940 Kidney transplant status: Principal | ICD-10-CM

## 2018-10-10 ENCOUNTER — Encounter: Admit: 2018-10-10 | Discharge: 2018-10-10 | Payer: MEDICARE | Attending: Nephrology | Primary: Nephrology

## 2018-10-10 ENCOUNTER — Encounter: Admit: 2018-10-10 | Discharge: 2018-10-10 | Payer: MEDICARE

## 2018-10-10 DIAGNOSIS — Z940 Kidney transplant status: Principal | ICD-10-CM

## 2018-10-10 DIAGNOSIS — D899 Disorder involving the immune mechanism, unspecified: Principal | ICD-10-CM

## 2018-10-10 DIAGNOSIS — I10 Essential (primary) hypertension: Principal | ICD-10-CM

## 2018-10-10 DIAGNOSIS — E119 Type 2 diabetes mellitus without complications: Principal | ICD-10-CM

## 2018-10-10 DIAGNOSIS — Z955 Presence of coronary angioplasty implant and graft: Principal | ICD-10-CM

## 2018-10-10 DIAGNOSIS — R351 Nocturia: Principal | ICD-10-CM

## 2018-10-10 DIAGNOSIS — Z7901 Long term (current) use of anticoagulants: Principal | ICD-10-CM

## 2018-10-10 DIAGNOSIS — Z794 Long term (current) use of insulin: Principal | ICD-10-CM

## 2018-10-10 DIAGNOSIS — I480 Paroxysmal atrial fibrillation: Principal | ICD-10-CM

## 2018-10-10 DIAGNOSIS — E7800 Pure hypercholesterolemia, unspecified: Principal | ICD-10-CM

## 2018-10-10 DIAGNOSIS — I2510 Atherosclerotic heart disease of native coronary artery without angina pectoris: Principal | ICD-10-CM

## 2018-10-10 DIAGNOSIS — J984 Other disorders of lung: Principal | ICD-10-CM

## 2018-10-10 DIAGNOSIS — Z7982 Long term (current) use of aspirin: Principal | ICD-10-CM

## 2018-10-10 DIAGNOSIS — E559 Vitamin D deficiency, unspecified: Principal | ICD-10-CM

## 2018-10-10 DIAGNOSIS — Z0000 Encounter for general adult medical examination without abnormal findings: Principal | ICD-10-CM

## 2018-10-10 DIAGNOSIS — I252 Old myocardial infarction: Principal | ICD-10-CM

## 2018-10-10 DIAGNOSIS — E1121 Type 2 diabetes mellitus with diabetic nephropathy: Principal | ICD-10-CM

## 2018-10-10 DIAGNOSIS — I4891 Unspecified atrial fibrillation: Principal | ICD-10-CM

## 2018-10-10 DIAGNOSIS — J690 Pneumonitis due to inhalation of food and vomit: Principal | ICD-10-CM

## 2018-10-10 DIAGNOSIS — M4620 Osteomyelitis of vertebra, site unspecified: Principal | ICD-10-CM

## 2018-10-10 DIAGNOSIS — N186 End stage renal disease: Principal | ICD-10-CM

## 2018-11-14 MED ORDER — CELLCEPT 250 MG CAPSULE
ORAL_CAPSULE | Freq: Two times a day (BID) | ORAL | 3 refills | 0.00000 days | Status: CP
Start: 2018-11-14 — End: 2019-11-14

## 2019-01-24 ENCOUNTER — Encounter: Admit: 2019-01-24 | Discharge: 2019-01-25 | Payer: MEDICARE

## 2019-01-24 DIAGNOSIS — Z940 Kidney transplant status: Principal | ICD-10-CM

## 2019-04-10 ENCOUNTER — Encounter: Admit: 2019-04-10 | Discharge: 2019-04-11 | Payer: MEDICARE

## 2019-04-10 DIAGNOSIS — Z940 Kidney transplant status: Secondary | ICD-10-CM

## 2019-04-12 ENCOUNTER — Encounter: Admit: 2019-04-12 | Discharge: 2019-04-13 | Payer: MEDICARE | Attending: Nephrology | Primary: Nephrology

## 2019-04-13 ENCOUNTER — Telehealth: Payer: Self-pay | Admitting: Podiatry

## 2019-04-13 NOTE — Telephone Encounter (Signed)
Pts daughter called and said she called [facility] about her dads shoes and they told her to call me.  Upon researching the shoes did get shipped to [facility] in late January, Jalessa looked and could not find them.  I called pts daughter back and explained that the paperwork has expired and that the pt needs an appt with the doctor that treats his diabetes(Dr Beverlyn Roux) so she can resign the paperwork. I have reordered the shoes and inserts.

## 2019-06-06 ENCOUNTER — Other Ambulatory Visit: Payer: Self-pay

## 2019-06-06 ENCOUNTER — Ambulatory Visit (INDEPENDENT_AMBULATORY_CARE_PROVIDER_SITE_OTHER): Payer: Medicare Other | Admitting: Orthotics

## 2019-06-06 DIAGNOSIS — M2042 Other hammer toe(s) (acquired), left foot: Secondary | ICD-10-CM

## 2019-06-06 DIAGNOSIS — E0843 Diabetes mellitus due to underlying condition with diabetic autonomic (poly)neuropathy: Secondary | ICD-10-CM

## 2019-06-06 DIAGNOSIS — M2041 Other hammer toe(s) (acquired), right foot: Secondary | ICD-10-CM

## 2019-06-06 DIAGNOSIS — L608 Other nail disorders: Secondary | ICD-10-CM

## 2019-06-06 NOTE — Progress Notes (Signed)

## 2019-06-25 ENCOUNTER — Encounter: Admit: 2019-06-25 | Discharge: 2019-06-26 | Payer: MEDICARE

## 2019-06-25 DIAGNOSIS — E119 Type 2 diabetes mellitus without complications: Principal | ICD-10-CM

## 2019-08-23 ENCOUNTER — Ambulatory Visit: Payer: Medicare Other | Attending: Internal Medicine

## 2019-08-23 DIAGNOSIS — Z23 Encounter for immunization: Secondary | ICD-10-CM | POA: Insufficient documentation

## 2019-08-27 ENCOUNTER — Encounter: Admit: 2019-08-27 | Discharge: 2019-08-28 | Payer: MEDICARE

## 2019-08-27 NOTE — Progress Notes (Signed)
   Covid-19 Vaccination Clinic  Name:  [name]    MRN: [id number] DOB: 1955-11-09  08/23/2019  [name] was observed post Covid-19 immunization for 15 minutes without incidence. He was provided with Vaccine Information Sheet and instruction to access the V-Safe system.   [name] was instructed to call 911 with any severe reactions post vaccine: [name] Difficulty breathing  . Swelling of your face and throat  . A fast heartbeat  . A bad rash all over your body  . Dizziness and weakness    Immunizations Administered    Name Date Dose VIS Date Route   Moderna COVID-19 Vaccine 08/23/2019  5:21 PM 0.5 mL 07/03/2019 Intramuscular   Manufacturer: Moderna   Lot: [id number]   [id number]

## 2019-08-28 ENCOUNTER — Other Ambulatory Visit: Payer: Self-pay

## 2019-08-28 ENCOUNTER — Ambulatory Visit (INDEPENDENT_AMBULATORY_CARE_PROVIDER_SITE_OTHER): Payer: Medicare Other

## 2019-08-28 ENCOUNTER — Encounter (INDEPENDENT_AMBULATORY_CARE_PROVIDER_SITE_OTHER): Payer: Self-pay | Admitting: Nurse Practitioner

## 2019-08-28 ENCOUNTER — Ambulatory Visit (INDEPENDENT_AMBULATORY_CARE_PROVIDER_SITE_OTHER): Payer: Medicare Other | Admitting: Nurse Practitioner

## 2019-08-28 VITALS — BP 122/58 | HR 55 | Resp 14 | Ht 68.0 in | Wt 163.0 lb

## 2019-08-28 DIAGNOSIS — I10 Essential (primary) hypertension: Secondary | ICD-10-CM | POA: Diagnosis not present

## 2019-08-28 DIAGNOSIS — I6523 Occlusion and stenosis of bilateral carotid arteries: Secondary | ICD-10-CM

## 2019-08-28 DIAGNOSIS — Z940 Kidney transplant status: Secondary | ICD-10-CM

## 2019-08-28 DIAGNOSIS — T829XXD Unspecified complication of cardiac and vascular prosthetic device, implant and graft, subsequent encounter: Secondary | ICD-10-CM | POA: Diagnosis not present

## 2019-08-28 DIAGNOSIS — E1159 Type 2 diabetes mellitus with other circulatory complications: Secondary | ICD-10-CM

## 2019-08-28 DIAGNOSIS — E7800 Pure hypercholesterolemia, unspecified: Secondary | ICD-10-CM

## 2019-08-30 ENCOUNTER — Encounter (INDEPENDENT_AMBULATORY_CARE_PROVIDER_SITE_OTHER): Payer: Self-pay | Admitting: Nurse Practitioner

## 2019-08-30 ENCOUNTER — Telehealth (INDEPENDENT_AMBULATORY_CARE_PROVIDER_SITE_OTHER): Payer: Self-pay

## 2019-08-30 ENCOUNTER — Encounter (INDEPENDENT_AMBULATORY_CARE_PROVIDER_SITE_OTHER): Payer: Self-pay

## 2019-08-30 DIAGNOSIS — I6529 Occlusion and stenosis of unspecified carotid artery: Secondary | ICD-10-CM | POA: Insufficient documentation

## 2019-08-30 NOTE — Telephone Encounter (Signed)
Spoke with the patient's daughter and he is now scheduled for surgery with Dr. Lucky Cowboy on 09/06/19. Patient will do his pre-op phone call between 8-1 on 09/04/19 and his covid testing on 09/05/19 before 11:00 am at the [facility]. Pre-surgical instructions will be mailed to the patient.

## 2019-08-30 NOTE — Progress Notes (Signed)
SUBJECTIVE:  Patient ID: [name], male    DOB: 12/11/1955, 64 y.o.   MRN: [id number] Chief Complaint  Patient presents with  . Follow-up    ultrasound    HPI  [name] is a 64 y.o. male that presents today for noninvasive studies related to his left brachiocephalic AV fistula.  The patient had a kidney transplant several years ago and so the fistula has not been used since that time.  The patient denies any issues currently with his kidney and is current with all of his transplant medications.  He denies any fever, chills, nausea, vomiting or diarrhea.  Patient has a flow volume of 1078 with some elevated velocities at the AV fistula anastomosis.  Incidentally, the patient also has brought a film from his cardiologist office of his carotid arteries.  The patient had recently been having significant dizzy spells until finally he actually had a full syncopal spell on 08/06/2019.  It was always noted that this would happen when the patient changed positions such as going from standing to squatting or bending over.  At this time it was found that the patient had significant carotid artery stenosis.  The CT film was independently reviewed by myself as well as Dr. Lucky Cowboy and was found to have an 85% stenosis within the left internal carotid artery and about a 30% stenosis in the right internal carotid artery.  Office visit notes from the patient's cardiologist were also included indicating that he is cleared to proceed with surgery.  Past Medical History:  Diagnosis Date  . Arthritis   . Chronic kidney disease    KIDNEY TRANSPLANT  . Coronary artery disease   . Diabetes mellitus without complication ([place])   . Myocardial infarction [facility])     Past Surgical History:  Procedure Laterality Date  . CATARACT EXTRACTION W/PHACO Right 02/03/2017   Procedure: CATARACT EXTRACTION PHACO AND INTRAOCULAR LENS PLACEMENT (IOC);  Surgeon: Leandrew Koyanagi, MD;  Location: [facility] ORS;  Service:  Ophthalmology;  Laterality: Right;  [place] 00:35.8 AP% 12.8 CDE 4.57 Fluid lot # [id number]  . CORONARY ANGIOPLASTY     STENTS X 4  . CORONARY ARTERY BYPASS GRAFT    . HIP SURGERY    . KIDNEY TRANSPLANT     2016    Social History   Socioeconomic History  . Marital status: Married    Spouse name: Not on file  . Number of children: Not on file  . Years of education: Not on file  . Highest education level: Not on file  Occupational History  . Not on file  Tobacco Use  . Smoking status: Never Smoker  . Smokeless tobacco: Never Used  Substance and Sexual Activity  . Alcohol use: No    Alcohol/week: 0.0 standard drinks  . Drug use: No  . Sexual activity: Not on file  Other Topics Concern  . Not on file  Social History Narrative  . Not on file   Social Determinants of Health   Financial Resource Strain:   . Difficulty of Paying Living Expenses: Not on file  Food Insecurity:   . Worried About Charity fundraiser in the Last Year: Not on file  . Ran Out of Food in the Last Year: Not on file  Transportation Needs:   . Lack of Transportation (Medical): Not on file  . Lack of Transportation (Non-Medical): Not on file  Physical Activity:   . Days of Exercise per Week: Not on file  .  Minutes of Exercise per Session: Not on file  Stress:   . Feeling of Stress : Not on file  Social Connections:   . Frequency of Communication with Friends and Family: Not on file  . Frequency of Social Gatherings with Friends and Family: Not on file  . Attends Religious Services: Not on file  . Active Member of Clubs or Organizations: Not on file  . Attends Archivist Meetings: Not on file  . Marital Status: Not on file  Intimate Partner Violence:   . Fear of Current or Ex-Partner: Not on file  . Emotionally Abused: Not on file  . Physically Abused: Not on file  . Sexually Abused: Not on file    Family History  Problem Relation Age of Onset  . Drug abuse Sister   . Drug abuse  Brother     No Known Allergies   Review of Systems   Review of Systems: Negative Unless Checked Constitutional: [] Weight loss  [] Fever  [] Chills Cardiac: [] Chest pain   [x]  Atrial Fibrillation  [] Palpitations   [] Shortness of breath when laying flat   [] Shortness of breath with exertion. [] Shortness of breath at rest Vascular:  [] Pain in legs with walking   [] Pain in legs with standing [] Pain in legs when laying flat   [] Claudication    [] Pain in feet when laying flat    [] History of DVT   [] Phlebitis   [] Swelling in legs   [] Varicose veins   [] Non-healing ulcers Pulmonary:   [] Uses home oxygen   [] Productive cough   [] Hemoptysis   [] Wheeze  [] COPD   [] Asthma Neurologic:  [] Dizziness   [] Seizures  [x] Blackouts [] History of stroke   [] History of TIA  [] Aphasia   [] Temporary Blindness   [] Weakness or numbness in arm   [] Weakness or numbness in leg Musculoskeletal:   [] Joint swelling   [] Joint pain   [] Low back pain  []  History of Knee Replacement [] Arthritis [] back Surgeries  []  Spinal Stenosis    Hematologic:  [] Easy bruising  [] Easy bleeding   [] Hypercoagulable state   [] Anemic Gastrointestinal:  [] Diarrhea   [] Vomiting  [] Gastroesophageal reflux/heartburn   [] Difficulty swallowing. [] Abdominal pain Genitourinary:  [] Chronic kidney disease   [] Difficult urination  [] Anuric   [] Blood in urine [] Frequent urination  [] Burning with urination   [] Hematuria Skin:  [] Rashes   [] Ulcers [] Wounds Psychological:  [] History of anxiety   []  History of major depression  []  Memory Difficulties      OBJECTIVE:   Physical Exam  BP (!) 122/58 (BP Location: Right Arm)   Pulse (!) 55   Resp 14   Ht 5\' 8"  (1.727 m)   Wt 163 lb (73.9 kg)   BMI 24.78 kg/m   Gen: WD/WN, NAD Head: /AT, No temporalis wasting.  Ear/Nose/Throat: Hearing grossly intact, nares w/o erythema or drainage Eyes: PER, EOMI, sclera nonicteric.  Neck: Supple, no masses.  No JVD.  Pulmonary:  Good air movement, no use of accessory  muscles.  Cardiac: RRR Vascular:  Carotid bruit left.  Good thrill and bruit of left AV fistula Vessel Right Left  Radial Palpable Palpable   Gastrointestinal: soft, non-distended. No guarding/no peritoneal signs.  Musculoskeletal: M/S 5/5 throughout.  No deformity or atrophy.  Neurologic: Pain and light touch intact in extremities.  Symmetrical.  Speech is fluent. Motor exam as listed above. Psychiatric: Judgment intact, Mood & affect appropriate for pt's clinical situation.        ASSESSMENT AND PLAN:  1. Kidney transplant recipient Currently  the patient has no issues with his kidney transplant.  His renal function is doing well and he is producing adequate amounts of urine.  His fistula also remains functional.  There are some elevated velocities however intervening at this time may place the kidney more risk and would not provide a great deal of benefit.  We will continue with annual follow-ups of his fistula.  2. Bilateral carotid artery stenosis Recommend:  The patient remains asymptomatic with respect to the carotid stenosis.  However, the patient has now progressed and has a lesion the is >75%.  Patient's CT angiography of the carotid arteries confirms >75% left   ICA stenosis.  The anatomical considerations support surgery over stenting.  This was discussed in detail with the patient.  The patient does indeed need surgery, therefore, cardiac clearance will be arranged. Once cleared the patient will be scheduled for surgery.  The risks, benefits and alternative therapies were reviewed in detail with the patient.  All questions were answered.  The patient agrees to proceed with surgery of the left carotid artery.  Continue antiplatelet therapy as prescribed. Continue management of CAD, HTN and Hyperlipidemia. Healthy heart diet, encouraged exercise at least 4 times per week.    3. Essential hypertension Good blood pressure today.  Patient appropriate medications no changes  made today.  4. Type 2 diabetes mellitus with other circulatory complication, unspecified whether long term insulin use (HCC) Good glucose control is essential for slowing atherosclerotic disease progression.  Patient on proper medication.  No changes made today.  5. Hypercholesteremia Good lipid control is essential to slow atherosclerotic disease progression.  Patient on appropriate medication.  No changes made today.   Current Outpatient Medications on File Prior to Visit  Medication Sig Dispense Refill  . amLODipine (NORVASC) 10 MG tablet Take 10 mg by mouth daily.     [name] atorvastatin (LIPITOR) 40 MG tablet 40 mg daily at 6 PM.     . carvedilol (COREG) 3.125 MG tablet 2 (two) times daily with a meal.     . chlorthalidone (HYGROTON) 25 MG tablet Take 25 mg by mouth daily.     . Cholecalciferol (VITAMIN D3) 50 MCG (2000 UT) capsule Take by mouth.    [name] 5 MG TABS tablet     . HUMALOG KWIKPEN 100 UNIT/ML KiwkPen Inject 10-12 Units into the skin 3 (three) times daily. Inject 10u daily at breakfast, 10u at lunch and up to 12u at supper per SSI    . Insulin Pen Needle (ADVOCATE INSULIN PEN NEEDLES) 33G X 4 MM MISC To use with insulin pen 4 times per day. E 13.9    . LANTUS SOLOSTAR 100 UNIT/ML Solostar Pen Inject 24 Units into the skin at bedtime.     [name] losartan (COZAAR) 50 MG tablet     . mycophenolate (CELLCEPT) 250 MG capsule Take by mouth.    [name] omeprazole (PRILOSEC) 20 MG capsule Take 20 mg by mouth daily.    [name] PROGRAF 1 MG capsule Take 1 mg by mouth 2 (two) times daily.     . tamsulosin (FLOMAX) 0.4 MG CAPS capsule Take 0.4 mg by mouth 2 (two) times daily.     [name] aspirin EC 81 MG tablet TAKE ONE (1) TABLET BY MOUTH EVERY DAY     No current facility-administered medications on file prior to visit.    There are no Patient Instructions on file for this visit. No follow-ups on file.   Kris Hartmann, NP  This note was completed with Sales executive.  Any errors are purely  unintentional.

## 2019-09-03 ENCOUNTER — Other Ambulatory Visit (INDEPENDENT_AMBULATORY_CARE_PROVIDER_SITE_OTHER): Payer: Self-pay | Admitting: Nurse Practitioner

## 2019-09-04 ENCOUNTER — Other Ambulatory Visit: Payer: Self-pay

## 2019-09-04 ENCOUNTER — Encounter
Admission: RE | Admit: 2019-09-04 | Discharge: 2019-09-04 | Disposition: A | Discharge status: Home / Self Care | Payer: Medicare Other | Source: Ambulatory Visit | Attending: Vascular Surgery | Admitting: Vascular Surgery

## 2019-09-04 HISTORY — DX: Dyspnea, unspecified: R06.00

## 2019-09-04 HISTORY — DX: Gastro-esophageal reflux disease without esophagitis: K21.9

## 2019-09-04 HISTORY — DX: Essential (primary) hypertension: I10

## 2019-09-04 HISTORY — DX: Personal history of other medical treatment: Z92.89

## 2019-09-04 NOTE — Patient Instructions (Signed)
Your procedure is scheduled on: thurs 2/4 Su procedimiento est programado para: Report to Day Surgery. [facility] a: To find out your arrival time please call [phone] between [place] on Wed. 2/3 Para saber su hora de llegada por favor llame al ([place]:   Remember: Instructions that are not followed completely may result in serious medical risk, up to and including death,  or upon the discretion of your surgeon and anesthesiologist your surgery may need to be rescheduled.  Recuerde: Las instrucciones que no se siguen completamente Heritage manager en un riesgo de salud grave, incluyendo hasta  la [place] o a discrecin de su cirujano y Environmental health practitioner, su ciruga se puede posponer.   __X_ 1.Do not eat food after midnight the night before your procedure. No    gum chewing or hard candies. You may drink clear liquids up to 2 hours     before you are scheduled to arrive for your surgery- DO not drink clear     Liquids within 2 hours of the start of your surgery.     Clear Liquids include:    water,    Gartorade Zero or Gatorade 2, Black Coffee or Tea (Do not add anything to coffee or tea).      No coma nada despus de la medianoche de la noche anterior a su    procedimiento. No coma chicles ni caramelos duros. Puede tomar    lquidos claros hasta 2 horas antes de su hora programada de llegada al     hospital para su procedimiento. No tome lquidos claros durante el     transcurso de las 2 horas de su llegada programada al hospital para su     procedimiento, ya que esto puede llevar a que su procedimiento se    retrase o tenga que volver a Health and safety inspector.  Los lquidos claros incluyen:          - Agua o jugo de [place] sin pulpa          - Bebidas claras con carbohidratos como ClearFast o Gatorade          - Caf negro o t claro (sin leche, sin cremas, no agregue nada al caf ni al t)  No tome nada que no est en esta lista.  Los  pacientes con diabetes tipo 1 y tipo 2 solo deben Agricultural engineer.  Llame a la clnica de PreCare o a la unidad de Same Day Surgery si  tiene alguna pregunta sobre estas instrucciones.              ___ 2.Do Not Smoke or use e-cigarettes For 24 Hours Prior to Your Surgery.    Do not use any chewable tobacco products for at least 6   hours prior to surgery.    No fume ni use cigarrillos electrnicos durante las 24 horas previas    a su [place].  No use ningn producto de tabaco masticable durante   al menos 6 horas antes de la ciruga.     ___ 3. No alcohol for 24 hours before or after surgery.    No tome alcohol durante las 24 horas antes ni despus de la [place].   ____4. Bring all medications with you on the day of surgery if instructed.    Lleve todos los medicamentos con usted el da de su ciruga si se le    ha indicado as.   _x___ 5. Notify your doctor  if there is any change in your medical condition (cold,fever, infections).    Informe a su mdico si hay algn cambio en su condicin mdica  (resfriado, fiebre, infecciones).   Do not wear jewelry, make-up, hairpins, clips or nail polish.  No use joyas, maquillajes, pinzas/ganchos para el cabello ni esmalte de uas.  Do not wear lotions, powders, or perfumes. You may wear deodorant.  No use lociones, polvos o perfumes.  Puede usar desodorante.    Do not shave 48 hours prior to surgery. Men may shave face and neck.  No se afeite 48 horas antes de la [place].  Los hombres pueden [employer] cara  y el cuello.   Do not bring valuables to the hospital.   No lleve objetos [place] is not responsible for any belongings or valuables.  [facility] no se hace responsable de ningn tipo de pertenencias u objetos de Geographical information systems officer.               Contacts, dentures or bridgework may not be worn into surgery.  Los lentes de [place], las dentaduras postizas o puentes no se pueden usar en la [place].   Bring your suitcase  with you . After surgery it may be brought to your room.            [name]  Despus de la ciruga podr traerla a su habitacin.   For patients admitted to the hospital, discharge time is determined by your  treatment team.  Para los pacientes que sean ingresados al hospital, el tiempo en el cual se le  dar de alta es determinado por su equipo de [place].   Patients discharged the day of surgery will not be allowed to drive home. A los pacientes que se les da de alta el mismo da de la ciruga no se les permitir conducir a Holiday representative.   Please read over the following fact sheets that you were given: Por favor Bell informacin que le dieron:      __x__ Take these medicines the morning of surgery with A SIP OF WATER:          [employer] estas medicinas la maana de la ciruga con UN SORBO DE AGUA:  1. amLODipine (NORVASC) 10 MG tablet 2.   3. carvedilol (COREG) 3.125 MG tablet  4 omeprazole (PRILOSEC) 20 MG capsule   Dose the night before and the morning of surgery  5.  6.  ____ Fleet Enema (as directed)          Enema de Fleet (segn lo indicado)    __x__ Use CHG Soap as directed          Utilice el jabn de CHG segn lo indicado  ____ Use inhalers on the day of surgery          Use los inhaladores el da de la ciruga  ____ Stop metformin 2 days prior to surgery          Deje de tomar el metformin 2 das antes de la ciruga    _x___ Take 1/2 of usual insulin dose the night before surgery and none on the morning of surgery           Tome la mitad de la dosis habitual de insulina la noche antes de la [place] y no tome nada en la maana de la             ciruga  __x__ Stop Eliquis today.  Take no more Eliquis           Deje de tomar el Coumadin/Plavix/aspirina el da:  ____ Stop Anti-inflammatories on           Deje de tomar antiinflamatorios el da:   ____ Stop supplements until after surgery            Deje de tomar suplementos hasta despus de la  ciruga  ____ Bring C-Pap to the hospital          [place] al hospital

## 2019-09-04 NOTE — Pre-Procedure Instructions (Signed)
Dr. Lucky Cowboy was notified that patient took his Eliquis this morning and surgery is 2/4.  He said it was OK to proceed. Patient is aware not to take anymore.

## 2019-09-05 ENCOUNTER — Other Ambulatory Visit: Admission: RE | Admit: 2019-09-05 | Payer: Medicare Other | Source: Ambulatory Visit

## 2019-09-05 ENCOUNTER — Other Ambulatory Visit: Payer: Medicare Other

## 2019-09-05 ENCOUNTER — Encounter
Admission: RE | Admit: 2019-09-05 | Discharge: 2019-09-05 | Disposition: A | Discharge status: Home / Self Care | Payer: Medicare Other | Source: Ambulatory Visit | Attending: Vascular Surgery | Admitting: Vascular Surgery

## 2019-09-05 LAB — CBC WITH DIFFERENTIAL/PLATELET
Abs Immature Granulocytes: 0.02 10*3/uL (ref 0.00–0.07)
Basophils Absolute: 0 10*3/uL (ref 0.0–0.1)
Basophils Relative: 1 %
Eosinophils Absolute: 0.1 10*3/uL (ref 0.0–0.5)
Eosinophils Relative: 1 %
HCT: 44.9 % (ref 39.0–52.0)
Hemoglobin: 15.2 g/dL (ref 13.0–17.0)
Immature Granulocytes: 0 %
Lymphocytes Relative: 17 %
Lymphs Abs: 1.2 10*3/uL (ref 0.7–4.0)
MCH: 29.6 pg (ref 26.0–34.0)
MCHC: 33.9 g/dL (ref 30.0–36.0)
MCV: 87.5 fL (ref 80.0–100.0)
Monocytes Absolute: 0.7 10*3/uL (ref 0.1–1.0)
Monocytes Relative: 10 %
Neutro Abs: 4.8 10*3/uL (ref 1.7–7.7)
Neutrophils Relative %: 71 %
Platelets: 165 10*3/uL (ref 150–400)
RBC: 5.13 MIL/uL (ref 4.22–5.81)
RDW: 12.7 % (ref 11.5–15.5)
WBC: 6.8 10*3/uL (ref 4.0–10.5)
nRBC: 0 % (ref 0.0–0.2)

## 2019-09-05 LAB — SARS CORONAVIRUS 2 (TAT 6-24 HRS): SARS Coronavirus 2: NEGATIVE

## 2019-09-05 LAB — BASIC METABOLIC PANEL
Anion gap: 12 (ref 5–15)
BUN: 31 mg/dL — ABNORMAL HIGH (ref 8–23)
CO2: 24 mmol/L (ref 22–32)
Calcium: 9.2 mg/dL (ref 8.9–10.3)
Chloride: 104 mmol/L (ref 98–111)
Creatinine, Ser: 1.18 mg/dL (ref 0.61–1.24)
GFR calc Af Amer: >60 mL/min (ref >60)
GFR calc non Af Amer: >60 mL/min (ref >60)
Glucose, Bld: 202 mg/dL — ABNORMAL HIGH (ref 70–99)
Potassium: 3.8 mmol/L (ref 3.5–5.1)
Sodium: 140 mmol/L (ref 135–145)

## 2019-09-05 LAB — TYPE AND SCREEN
ABO/RH(D): A POS
Antibody Screen: NEGATIVE

## 2019-09-05 LAB — APTT: aPTT: 32 seconds (ref 24–36)

## 2019-09-05 LAB — PROTIME-INR
INR: 1.3 — ABNORMAL HIGH (ref 0.8–1.2)
Prothrombin Time: 15.6 seconds — ABNORMAL HIGH (ref 11.4–15.2)

## 2019-09-05 NOTE — Pre-Procedure Instructions (Signed)
Dr Ronelle Nigh requesting clearance r/t abn EKG. Elta Guadeloupe notified at office. Spoke to staff at Dr Laurelyn Sickle office. The patient has recently been worked up by Dr Humphrey Rolls but he is out of office today, back tomorrow and will be given clearance request. Have asked they send directly to [facility] pre-op asap.

## 2019-09-06 ENCOUNTER — Encounter
Admission: RE | Disposition: A | Discharge status: Home / Self Care | Payer: Self-pay | Source: Home / Self Care | Attending: Vascular Surgery

## 2019-09-06 ENCOUNTER — Other Ambulatory Visit: Payer: Self-pay

## 2019-09-06 ENCOUNTER — Inpatient Hospital Stay: Payer: Medicare Other | Admitting: Anesthesiology

## 2019-09-06 ENCOUNTER — Inpatient Hospital Stay
Admission: RE | Admit: 2019-09-06 | Discharge: 2019-09-07 | DRG: 038 | Disposition: A | Discharge status: Home / Self Care | Payer: Medicare Other | Attending: Vascular Surgery | Admitting: Vascular Surgery

## 2019-09-06 ENCOUNTER — Encounter: Payer: Self-pay | Admitting: Vascular Surgery

## 2019-09-06 DIAGNOSIS — I2510 Atherosclerotic heart disease of native coronary artery without angina pectoris: Secondary | ICD-10-CM | POA: Diagnosis present

## 2019-09-06 DIAGNOSIS — Z940 Kidney transplant status: Secondary | ICD-10-CM | POA: Diagnosis not present

## 2019-09-06 DIAGNOSIS — I252 Old myocardial infarction: Secondary | ICD-10-CM | POA: Diagnosis not present

## 2019-09-06 DIAGNOSIS — I6522 Occlusion and stenosis of left carotid artery: Secondary | ICD-10-CM | POA: Diagnosis present

## 2019-09-06 DIAGNOSIS — I10 Essential (primary) hypertension: Secondary | ICD-10-CM | POA: Diagnosis present

## 2019-09-06 DIAGNOSIS — K219 Gastro-esophageal reflux disease without esophagitis: Secondary | ICD-10-CM | POA: Diagnosis present

## 2019-09-06 DIAGNOSIS — Z87891 Personal history of nicotine dependence: Secondary | ICD-10-CM

## 2019-09-06 DIAGNOSIS — Z955 Presence of coronary angioplasty implant and graft: Secondary | ICD-10-CM | POA: Diagnosis not present

## 2019-09-06 DIAGNOSIS — E119 Type 2 diabetes mellitus without complications: Secondary | ICD-10-CM | POA: Diagnosis present

## 2019-09-06 DIAGNOSIS — Z794 Long term (current) use of insulin: Secondary | ICD-10-CM

## 2019-09-06 DIAGNOSIS — Z20822 Contact with and (suspected) exposure to covid-19: Secondary | ICD-10-CM | POA: Diagnosis present

## 2019-09-06 HISTORY — PX: ENDARTERECTOMY: SHX5162

## 2019-09-06 LAB — GLUCOSE, CAPILLARY
Glucose-Capillary: 105 mg/dL — ABNORMAL HIGH (ref 70–99)
Glucose-Capillary: 123 mg/dL — ABNORMAL HIGH (ref 70–99)
Glucose-Capillary: 139 mg/dL — ABNORMAL HIGH (ref 70–99)
Glucose-Capillary: 175 mg/dL — ABNORMAL HIGH (ref 70–99)
Glucose-Capillary: 96 mg/dL (ref 70–99)

## 2019-09-06 LAB — ABO/RH: ABO/RH(D): A POS

## 2019-09-06 LAB — MRSA PCR SCREENING: MRSA by PCR: NEGATIVE

## 2019-09-06 SURGERY — ENDARTERECTOMY, CAROTID
Anesthesia: General | Laterality: Left

## 2019-09-06 MED ORDER — FENTANYL CITRATE (PF) 100 MCG/2ML IJ SOLN
INTRAMUSCULAR | Status: AC
  Filled 2019-09-06: qty 2

## 2019-09-06 MED ORDER — ESMOLOL HCL-SODIUM CHLORIDE 2000 MG/100ML IV SOLN
25.0000 ug/kg/min | INTRAVENOUS | Status: DC
  Filled 2019-09-06: qty 100

## 2019-09-06 MED ORDER — INSULIN GLARGINE 100 UNIT/ML ~~LOC~~ SOLN
24.0000 [IU] | Freq: Every day | SUBCUTANEOUS | Status: DC
  Administered 2019-09-06: 24 [IU] via SUBCUTANEOUS
  Filled 2019-09-06 (×2): qty 0.24

## 2019-09-06 MED ORDER — HYDRALAZINE HCL 20 MG/ML IJ SOLN
INTRAMUSCULAR | Status: AC
  Administered 2019-09-06: 10 mg via INTRAVENOUS
  Filled 2019-09-06: qty 1

## 2019-09-06 MED ORDER — ACETAMINOPHEN 325 MG PO TABS: 325.0000 mg | ORAL_TABLET | ORAL | Status: DC | PRN

## 2019-09-06 MED ORDER — SODIUM CHLORIDE 0.9 % IV SOLN: INTRAVENOUS | Status: DC

## 2019-09-06 MED ORDER — PROPOFOL 10 MG/ML IV BOLUS
INTRAVENOUS | Status: DC | PRN
  Administered 2019-09-06: 50 mg via INTRAVENOUS
  Administered 2019-09-06: 100 mg via INTRAVENOUS

## 2019-09-06 MED ORDER — LIDOCAINE HCL 1 % IJ SOLN
INTRAMUSCULAR | Status: DC | PRN
  Administered 2019-09-06: 10 mL

## 2019-09-06 MED ORDER — TACROLIMUS 1 MG PO CAPS
1.0000 mg | ORAL_CAPSULE | Freq: Two times a day (BID) | ORAL | Status: DC
  Administered 2019-09-06 – 2019-09-07 (×2): 1 mg via ORAL
  Filled 2019-09-06 (×3): qty 1

## 2019-09-06 MED ORDER — REMIFENTANIL HCL 1 MG IV SOLR
INTRAVENOUS | Status: DC | PRN
  Administered 2019-09-06: .1 ug/kg/min via INTRAVENOUS

## 2019-09-06 MED ORDER — MAGNESIUM SULFATE 2 GM/50ML IV SOLN: 2.0000 g | Freq: Every day | INTRAVENOUS | Status: DC | PRN

## 2019-09-06 MED ORDER — HYDRALAZINE HCL 20 MG/ML IJ SOLN: 10.0000 mg | Freq: Once | INTRAMUSCULAR | Status: AC

## 2019-09-06 MED ORDER — INSULIN ASPART 100 UNIT/ML ~~LOC~~ SOLN: 12.0000 [IU] | Freq: Every day | SUBCUTANEOUS | Status: DC

## 2019-09-06 MED ORDER — CEFAZOLIN SODIUM-DEXTROSE 2-4 GM/100ML-% IV SOLN
INTRAVENOUS | Status: AC
  Filled 2019-09-06: qty 100

## 2019-09-06 MED ORDER — POTASSIUM CHLORIDE CRYS ER 20 MEQ PO TBCR: 20.0000 meq | EXTENDED_RELEASE_TABLET | Freq: Every day | ORAL | Status: DC | PRN

## 2019-09-06 MED ORDER — PROPOFOL 500 MG/50ML IV EMUL
INTRAVENOUS | Status: AC
  Filled 2019-09-06: qty 50

## 2019-09-06 MED ORDER — GLYCOPYRROLATE 0.2 MG/ML IJ SOLN
INTRAMUSCULAR | Status: DC | PRN
  Administered 2019-09-06: .2 mg via INTRAVENOUS

## 2019-09-06 MED ORDER — NITROGLYCERIN IN D5W 200-5 MCG/ML-% IV SOLN: 5.0000 ug/min | INTRAVENOUS | Status: DC

## 2019-09-06 MED ORDER — PANTOPRAZOLE SODIUM 40 MG PO TBEC
40.0000 mg | DELAYED_RELEASE_TABLET | Freq: Every day | ORAL | Status: DC
  Administered 2019-09-07: 40 mg via ORAL
  Filled 2019-09-06: qty 1

## 2019-09-06 MED ORDER — PHENOL 1.4 % MT LIQD
1.0000 | OROMUCOSAL | Status: DC | PRN
  Filled 2019-09-06: qty 177

## 2019-09-06 MED ORDER — EPHEDRINE SULFATE 50 MG/ML IJ SOLN
INTRAMUSCULAR | Status: DC | PRN
  Administered 2019-09-06 (×4): 5 mg via INTRAVENOUS

## 2019-09-06 MED ORDER — LABETALOL HCL 5 MG/ML IV SOLN
INTRAVENOUS | Status: AC
  Filled 2019-09-06: qty 4

## 2019-09-06 MED ORDER — ROCURONIUM BROMIDE 100 MG/10ML IV SOLN
INTRAVENOUS | Status: DC | PRN
  Administered 2019-09-06: 50 mg via INTRAVENOUS
  Administered 2019-09-06: 20 mg via INTRAVENOUS

## 2019-09-06 MED ORDER — CARVEDILOL 3.125 MG PO TABS
3.1250 mg | ORAL_TABLET | Freq: Two times a day (BID) | ORAL | Status: DC
  Administered 2019-09-07: 3.125 mg via ORAL
  Filled 2019-09-06: qty 1

## 2019-09-06 MED ORDER — SUGAMMADEX SODIUM 200 MG/2ML IV SOLN
INTRAVENOUS | Status: DC | PRN
  Administered 2019-09-06: 150 mg via INTRAVENOUS

## 2019-09-06 MED ORDER — LABETALOL HCL 5 MG/ML IV SOLN: 10.0000 mg | INTRAVENOUS | Status: DC | PRN

## 2019-09-06 MED ORDER — HEPARIN SODIUM (PORCINE) 1000 UNIT/ML IJ SOLN
INTRAMUSCULAR | Status: DC | PRN
  Administered 2019-09-06: 6000 [IU] via INTRAVENOUS

## 2019-09-06 MED ORDER — LOSARTAN POTASSIUM 50 MG PO TABS
50.0000 mg | ORAL_TABLET | Freq: Every day | ORAL | Status: DC
  Administered 2019-09-06: 50 mg via ORAL
  Filled 2019-09-06: qty 1

## 2019-09-06 MED ORDER — ASPIRIN EC 81 MG PO TBEC
81.0000 mg | DELAYED_RELEASE_TABLET | Freq: Every day | ORAL | Status: DC
  Administered 2019-09-07: 81 mg via ORAL
  Filled 2019-09-06: qty 1

## 2019-09-06 MED ORDER — LABETALOL HCL 5 MG/ML IV SOLN
INTRAVENOUS | Status: DC | PRN
  Administered 2019-09-06 (×2): 5 mg via INTRAVENOUS

## 2019-09-06 MED ORDER — HEPARIN SODIUM (PORCINE) 10000 UNIT/ML IJ SOLN
INTRAMUSCULAR | Status: AC
  Filled 2019-09-06: qty 1

## 2019-09-06 MED ORDER — HYDRALAZINE HCL 20 MG/ML IJ SOLN
5.0000 mg | INTRAMUSCULAR | Status: DC | PRN
  Administered 2019-09-06: 5 mg via INTRAVENOUS
  Filled 2019-09-06: qty 1

## 2019-09-06 MED ORDER — NITROGLYCERIN IN D5W 200-5 MCG/ML-% IV SOLN
INTRAVENOUS | Status: AC
  Filled 2019-09-06: qty 250

## 2019-09-06 MED ORDER — NITROGLYCERIN 0.2 MG/ML ON CALL CATH LAB
INTRAVENOUS | Status: DC | PRN
  Administered 2019-09-06 (×4): 20 ug via INTRAVENOUS
  Administered 2019-09-06: 10 ug via INTRAVENOUS
  Administered 2019-09-06: 20 ug via INTRAVENOUS
  Administered 2019-09-06: 10 ug via INTRAVENOUS
  Administered 2019-09-06 (×3): 20 ug via INTRAVENOUS

## 2019-09-06 MED ORDER — PROPOFOL 500 MG/50ML IV EMUL
INTRAVENOUS | Status: DC | PRN
  Administered 2019-09-06: 125 ug/kg/min via INTRAVENOUS
  Administered 2019-09-06: 150 ug via INTRAVENOUS

## 2019-09-06 MED ORDER — ATORVASTATIN CALCIUM 20 MG PO TABS
40.0000 mg | ORAL_TABLET | Freq: Every day | ORAL | Status: DC
  Administered 2019-09-06: 40 mg via ORAL
  Filled 2019-09-06: qty 2

## 2019-09-06 MED ORDER — MORPHINE SULFATE (PF) 2 MG/ML IV SOLN
2.0000 mg | INTRAVENOUS | Status: DC | PRN
  Administered 2019-09-06: 2 mg via INTRAVENOUS
  Administered 2019-09-06 – 2019-09-07 (×2): 4 mg via INTRAVENOUS
  Filled 2019-09-06: qty 2
  Filled 2019-09-06: qty 1
  Filled 2019-09-06: qty 2

## 2019-09-06 MED ORDER — REMIFENTANIL HCL 1 MG IV SOLR
INTRAVENOUS | Status: AC
  Filled 2019-09-06: qty 1000

## 2019-09-06 MED ORDER — METOPROLOL TARTRATE 5 MG/5ML IV SOLN: 2.0000 mg | INTRAVENOUS | Status: DC | PRN

## 2019-09-06 MED ORDER — OXYCODONE-ACETAMINOPHEN 5-325 MG PO TABS
1.0000 | ORAL_TABLET | ORAL | Status: DC | PRN
  Administered 2019-09-07 (×2): 1 via ORAL
  Filled 2019-09-06 (×2): qty 1

## 2019-09-06 MED ORDER — REMIFENTANIL HCL 1 MG IV SOLR: INTRAVENOUS | Status: DC | PRN

## 2019-09-06 MED ORDER — LIDOCAINE HCL (PF) 1 % IJ SOLN
INTRAMUSCULAR | Status: AC
  Filled 2019-09-06: qty 30

## 2019-09-06 MED ORDER — LIDOCAINE HCL (CARDIAC) PF 100 MG/5ML IV SOSY
PREFILLED_SYRINGE | INTRAVENOUS | Status: DC | PRN
  Administered 2019-09-06: 100 mg via INTRAVENOUS

## 2019-09-06 MED ORDER — GUAIFENESIN-DM 100-10 MG/5ML PO SYRP: 15.0000 mL | ORAL_SOLUTION | ORAL | Status: DC | PRN

## 2019-09-06 MED ORDER — CHLORTHALIDONE 25 MG PO TABS
25.0000 mg | ORAL_TABLET | Freq: Every day | ORAL | Status: DC
  Administered 2019-09-06 – 2019-09-07 (×2): 25 mg via ORAL
  Filled 2019-09-06 (×2): qty 1

## 2019-09-06 MED ORDER — CHLORHEXIDINE GLUCONATE CLOTH 2 % EX PADS: 6.0000 | MEDICATED_PAD | Freq: Once | CUTANEOUS | Status: DC

## 2019-09-06 MED ORDER — ACETAMINOPHEN 10 MG/ML IV SOLN: 1000.0000 mg | Freq: Once | INTRAVENOUS | Status: DC | PRN

## 2019-09-06 MED ORDER — SODIUM CHLORIDE 0.9 % IV SOLN: 500.0000 mL | Freq: Once | INTRAVENOUS | Status: DC | PRN

## 2019-09-06 MED ORDER — ASPIRIN EC 81 MG PO TBEC: 81.0000 mg | DELAYED_RELEASE_TABLET | Freq: Every day | ORAL | Status: DC

## 2019-09-06 MED ORDER — AMLODIPINE BESYLATE 10 MG PO TABS
10.0000 mg | ORAL_TABLET | Freq: Every day | ORAL | Status: DC
  Administered 2019-09-07: 10 mg via ORAL
  Filled 2019-09-06: qty 1

## 2019-09-06 MED ORDER — OXYCODONE HCL 5 MG/5ML PO SOLN: 5.0000 mg | Freq: Once | ORAL | Status: DC | PRN

## 2019-09-06 MED ORDER — ONDANSETRON HCL 4 MG/2ML IJ SOLN
4.0000 mg | Freq: Four times a day (QID) | INTRAMUSCULAR | Status: DC | PRN
  Administered 2019-09-07: 4 mg via INTRAVENOUS
  Filled 2019-09-06 (×2): qty 2

## 2019-09-06 MED ORDER — CEFAZOLIN SODIUM-DEXTROSE 2-4 GM/100ML-% IV SOLN
2.0000 g | Freq: Three times a day (TID) | INTRAVENOUS | Status: AC
  Administered 2019-09-06 – 2019-09-07 (×2): 2 g via INTRAVENOUS
  Filled 2019-09-06 (×2): qty 100

## 2019-09-06 MED ORDER — INSULIN ASPART 100 UNIT/ML ~~LOC~~ SOLN
10.0000 [IU] | Freq: Two times a day (BID) | SUBCUTANEOUS | Status: DC
  Administered 2019-09-07: 10 [IU] via SUBCUTANEOUS
  Filled 2019-09-06: qty 1

## 2019-09-06 MED ORDER — FENTANYL CITRATE (PF) 100 MCG/2ML IJ SOLN
INTRAMUSCULAR | Status: DC | PRN
  Administered 2019-09-06: 50 ug via INTRAVENOUS
  Administered 2019-09-06: 100 ug via INTRAVENOUS

## 2019-09-06 MED ORDER — DOCUSATE SODIUM 100 MG PO CAPS
100.0000 mg | ORAL_CAPSULE | Freq: Every day | ORAL | Status: DC
  Administered 2019-09-07: 100 mg via ORAL
  Filled 2019-09-06: qty 1

## 2019-09-06 MED ORDER — TAMSULOSIN HCL 0.4 MG PO CAPS
0.4000 mg | ORAL_CAPSULE | Freq: Two times a day (BID) | ORAL | Status: DC
  Administered 2019-09-06 – 2019-09-07 (×2): 0.4 mg via ORAL
  Filled 2019-09-06 (×2): qty 1

## 2019-09-06 MED ORDER — ALUM & MAG HYDROXIDE-SIMETH 200-200-20 MG/5ML PO SUSP: 15.0000 mL | ORAL | Status: DC | PRN

## 2019-09-06 MED ORDER — ONDANSETRON HCL 4 MG/2ML IJ SOLN: 4.0000 mg | Freq: Once | INTRAMUSCULAR | Status: DC | PRN

## 2019-09-06 MED ORDER — CHLORHEXIDINE GLUCONATE CLOTH 2 % EX PADS
6.0000 | MEDICATED_PAD | Freq: Every day | CUTANEOUS | Status: DC
  Administered 2019-09-06: 6 via TOPICAL

## 2019-09-06 MED ORDER — GLYCOPYRROLATE 0.2 MG/ML IJ SOLN
INTRAMUSCULAR | Status: AC
  Filled 2019-09-06: qty 1

## 2019-09-06 MED ORDER — OXYCODONE HCL 5 MG PO TABS: 5.0000 mg | ORAL_TABLET | Freq: Once | ORAL | Status: DC | PRN

## 2019-09-06 MED ORDER — ACETAMINOPHEN 650 MG RE SUPP: 325.0000 mg | RECTAL | Status: DC | PRN

## 2019-09-06 MED ORDER — FENTANYL CITRATE (PF) 100 MCG/2ML IJ SOLN
25.0000 ug | INTRAMUSCULAR | Status: DC | PRN
  Administered 2019-09-06 (×2): 25 ug via INTRAVENOUS

## 2019-09-06 MED ORDER — CEFAZOLIN SODIUM-DEXTROSE 2-4 GM/100ML-% IV SOLN
2.0000 g | INTRAVENOUS | Status: AC
  Administered 2019-09-06: 2 g via INTRAVENOUS

## 2019-09-06 MED ORDER — ONDANSETRON HCL 4 MG/2ML IJ SOLN
INTRAMUSCULAR | Status: DC | PRN
  Administered 2019-09-06: 4 mg via INTRAVENOUS

## 2019-09-06 MED ORDER — MYCOPHENOLATE MOFETIL 250 MG PO CAPS
250.0000 mg | ORAL_CAPSULE | Freq: Two times a day (BID) | ORAL | Status: DC
  Administered 2019-09-06 – 2019-09-07 (×2): 250 mg via ORAL
  Filled 2019-09-06 (×3): qty 1

## 2019-09-06 MED ORDER — FAMOTIDINE IN NACL 20-0.9 MG/50ML-% IV SOLN
20.0000 mg | Freq: Two times a day (BID) | INTRAVENOUS | Status: DC
  Administered 2019-09-06: 20 mg via INTRAVENOUS
  Filled 2019-09-06: qty 50

## 2019-09-06 MED ORDER — APIXABAN 5 MG PO TABS
5.0000 mg | ORAL_TABLET | Freq: Two times a day (BID) | ORAL | Status: DC
  Administered 2019-09-07: 5 mg via ORAL
  Filled 2019-09-06 (×2): qty 1

## 2019-09-06 MED ORDER — "VISTASEAL 4 ML SINGLE DOSE KIT "
PACK | CUTANEOUS | Status: DC | PRN
  Administered 2019-09-06: 4 mL via TOPICAL

## 2019-09-06 SURGICAL SUPPLY — 58 items
BAG DECANTER FOR FLEXI CONT (MISCELLANEOUS) ×2 IMPLANT
BLADE SURG 15 STRL LF DISP TIS (BLADE) ×1 IMPLANT
BLADE SURG 15 STRL SS (BLADE) ×1
BLADE SURG SZ11 CARB STEEL (BLADE) ×2 IMPLANT
BOOT SUTURE AID YELLOW STND (SUTURE) ×2 IMPLANT
BRUSH SCRUB EZ  4% CHG (MISCELLANEOUS) ×1
BRUSH SCRUB EZ 4% CHG (MISCELLANEOUS) ×1 IMPLANT
CANISTER SUCT 1200ML W/VALVE (MISCELLANEOUS) ×2 IMPLANT
CHLORAPREP W/TINT 26ML (MISCELLANEOUS) ×2 IMPLANT
COVER WAND RF STERILE (DRAPES) ×2 IMPLANT
DERMABOND ADVANCED (GAUZE/BANDAGES/DRESSINGS) ×1
DERMABOND ADVANCED .7 DNX12 (GAUZE/BANDAGES/DRESSINGS) ×1 IMPLANT
DRAPE 3/4 80X56 (DRAPES) ×2 IMPLANT
DRAPE INCISE IOBAN 66X45 STRL (DRAPES) ×2 IMPLANT
DRAPE LAPAROTOMY 77X122 PED (DRAPES) ×2 IMPLANT
ELECT CAUTERY BLADE 6.4 (BLADE) ×2 IMPLANT
ELECT REM PT RETURN 9FT ADLT (ELECTROSURGICAL) ×2
ELECTRODE REM PT RTRN 9FT ADLT (ELECTROSURGICAL) ×1 IMPLANT
GLOVE BIO SURGEON STRL SZ7 (GLOVE) ×6 IMPLANT
GLOVE INDICATOR 7.5 STRL GRN (GLOVE) ×2 IMPLANT
GOWN STRL REUS W/ TWL LRG LVL3 (GOWN DISPOSABLE) ×2 IMPLANT
GOWN STRL REUS W/ TWL XL LVL3 (GOWN DISPOSABLE) ×2 IMPLANT
GOWN STRL REUS W/TWL LRG LVL3 (GOWN DISPOSABLE) ×2
GOWN STRL REUS W/TWL XL LVL3 (GOWN DISPOSABLE) ×2
HEMOSTAT SURGICEL 2X3 (HEMOSTASIS) ×2 IMPLANT
IV NS 250ML (IV SOLUTION) ×1
IV NS 250ML BAXH (IV SOLUTION) ×1 IMPLANT
KIT TURNOVER KIT A (KITS) ×2 IMPLANT
LABEL OR SOLS (LABEL) ×2 IMPLANT
LOOP RED MAXI  1X406MM (MISCELLANEOUS) ×2
LOOP VESSEL MAXI 1X406 RED (MISCELLANEOUS) ×2 IMPLANT
LOOP VESSEL MINI 0.8X406 BLUE (MISCELLANEOUS) ×1 IMPLANT
LOOPS BLUE MINI 0.8X406MM (MISCELLANEOUS) ×1
NEEDLE FILTER BLUNT 18X 1/2SAF (NEEDLE) ×1
NEEDLE FILTER BLUNT 18X1 1/2 (NEEDLE) ×1 IMPLANT
NEEDLE HYPO 25X1 1.5 SAFETY (NEEDLE) ×2 IMPLANT
NS IRRIG 500ML POUR BTL (IV SOLUTION) ×2 IMPLANT
PACK BASIN MAJOR ARMC (MISCELLANEOUS) ×2 IMPLANT
PATCH CAROTID ECM VASC 1X10 (Prosthesis & Implant Heart) ×2 IMPLANT
PENCIL ELECTRO HAND CTR (MISCELLANEOUS) IMPLANT
SHUNT W TPORT 9FR PRUITT F3 (SHUNT) ×2 IMPLANT
SUT MNCRL 4-0 (SUTURE) ×1
SUT MNCRL 4-0 27XMFL (SUTURE) ×1
SUT PROLENE 6 0 BV (SUTURE) ×8 IMPLANT
SUT PROLENE 7 0 BV 1 (SUTURE) ×6 IMPLANT
SUT SILK 2 0 (SUTURE) ×1
SUT SILK 2-0 18XBRD TIE 12 (SUTURE) ×1 IMPLANT
SUT SILK 3 0 (SUTURE) ×1
SUT SILK 3-0 18XBRD TIE 12 (SUTURE) ×1 IMPLANT
SUT SILK 4 0 (SUTURE) ×1
SUT SILK 4-0 18XBRD TIE 12 (SUTURE) ×1 IMPLANT
SUT VIC AB 3-0 SH 27 (SUTURE) ×2
SUT VIC AB 3-0 SH 27X BRD (SUTURE) ×2 IMPLANT
SUTURE MNCRL 4-0 27XMF (SUTURE) ×1 IMPLANT
SYR 10ML LL (SYRINGE) ×2 IMPLANT
SYR 20ML LL LF (SYRINGE) ×2 IMPLANT
TRAY FOLEY MTR SLVR 16FR STAT (SET/KITS/TRAYS/PACK) ×2 IMPLANT
TUBING CONNECTING 10 (TUBING) IMPLANT

## 2019-09-06 NOTE — Op Note (Signed)
[facility]   OPERATIVE NOTE  PROCEDURE:   1.  Left carotid endarterectomy with CorMatrix arterial patch reconstruction  PRE-OPERATIVE DIAGNOSIS: 1.  High grade carotid stenosis 2. Diabetes 3. Hypertension 4. ESRD, s/p renal transplant  POST-OPERATIVE DIAGNOSIS: same as above   SURGEON: Leotis Pain, MD  ASSISTANT(S): Hezzie Bump, PA-C  ANESTHESIA: general  ESTIMATED BLOOD LOSS: 30 cc  FINDING(S): 1.  Left carotid plaque.  SPECIMEN(S):  Carotid plaque (sent to Pathology)  INDICATIONS:   [name] is a 64 y.o. male who presents with left carotid stenosis of at least 70%.  I discussed with the patient the risks, benefits, and alternatives to carotid endarterectomy.  I discussed the differences between carotid stenting and carotid endarterectomy. I discussed the procedural details of carotid endarterectomy with the patient.  The patient is aware that the risks of carotid endarterectomy include but are not limited to: bleeding, infection, stroke, myocardial infarction, death, cranial nerve injuries both temporary and permanent, neck hematoma, possible airway compromise, labile blood pressure post-operatively, cerebral hyperperfusion syndrome, and possible need for additional interventions in the future. The patient is aware of the risks and agrees to proceed forward with the procedure.  DESCRIPTION: After full informed written consent was obtained from the patient, the patient was brought back to the operating room and placed supine upon the operating table.  Prior to induction, the patient received IV antibiotics.  After obtaining adequate anesthesia, the patient was placed into a modified beach chair position with a shoulder roll in place and the patient's neck slightly hyperextended and rotated away from the surgical site.  The patient was prepped in the standard fashion for a carotid endarterectomy.  I made an incision anterior to the sternocleidomastoid  muscle and dissected down through the subcutaneous tissue.  The platysmas was opened with electrocautery.  Then I dissected down to the internal jugular vein and facial vein.  The facial vein is ligated and divided between 2-0 silk ties.  This was dissected posteriorly until I obtained visualization of the common carotid artery.  This was dissected out and then a vessel loop was placed around the common carotid artery.  I then dissected in a periadventitial fashion along the common carotid artery up to the bifurcation.  I then identified the external carotid artery and the superior thyroid artery.  I placed a vessel loop around the superior thyroid artery, and I also dissected out the external carotid artery and placed a vessel loop around it. In the process of this dissection, the hypoglossal nerve was identified and protected from harm.  I then dissected out the internal carotid artery until I identified an area in the internal carotid artery clearly above the stenosis.  I dissected slightly distal to this area, and placed a vessel loop around the artery.  At this point, we gave the patient 6000 units of intravenous heparin.  After this was allowed to circulate for several minutes, I pulled up control on the vessel loops to clamp the internal carotid artery, external carotid artery, superior thyroid artery, and then the common carotid artery.  I then made an arteriotomy in the common carotid artery with a 11 blade, and extended the arteriotomy with a Potts scissor down into the common carotid artery, then I carried the arteriotomy through the bifurcation into the internal carotid artery until I reached an area that was not diseased.  At this point, I took the [place] shunt that previously been prepared and I inserted it into  the internal carotid artery first, and then into the common carotid artery taking care to flush and de-air prior to release of control. At this point, I started the endarterectomy in  the common carotid artery with a [place] elevator and carried this dissection down into the common carotid artery circumferentially.  Then I transected the plaque at a segment where it was adherent and transected the plaque with Potts scissors.  I then carried this dissection up into the external carotid artery.  The plaque was extracted by unclamping the external carotid artery and performing an eversion endarterectomy.  The dissection was then carried into the internal carotid artery where a nice feathered end point was created with gentle traction.  I passed the plaque off the field as a specimen. At this point I removed all loose flecks and remaining disease possible.  At this point, I was satisfied that the minimal remaining disease was densely adherent to the wall and wall integrity was intact. The distal endpoint was tacked down with three 7-0 Prolene sutures.  I then fashioned a CorMatrix arterial patch for the artery and sewed it in place with two running stitch of 6-0 Prolene.  I started at the distal endpoint and ran one half the length of the arteriotomy.  I then cut and beveled the patch to an appropriate length to match the arteriotomy.  I started the second 6-0 Prolene at the proximal end point.  The medial suture line was completed and the lateral suture line was run approximately one quarter the length of the arteriotomy.  Prior to completing this patch angioplasty, I removed the shunt first from the internal carotid artery, from which there was excellent backbleeding, and clamped it.  Then I removed the shunt from the common carotid artery, from which there was excellent antegrade bleeding, and then clamped it.  At this point, I allowed the external carotid artery to backbleed, which was excellent.  Then I instilled heparinized saline in this patched artery and then completed the patch angioplasty in the usual fashion.  First, I released the clamp on the external carotid artery, then I released it  on the common carotid artery.  After waiting a few seconds, I then released it on the internal carotid artery. Several minutes of pressure were held and 6-0 Prolene patch sutures were used as need for hemostasis.  At this point, I placed Surgicel and Evicel topical hemostatic agents.  There was no more active bleeding in the surgical site.  The sternocleidomastoid space was closed with three interrupted 3-0 Vicryl sutures. I then reapproximated the platysma muscle with a running stitch of 3-0 Vicryl.  The skin was then closed with a running subcuticular 4-0 Monocryl.  The skin was then cleaned, dried and Dermabond was used to reinforce the skin closure.  The patient awakened and was taken to the recovery room in stable condition, following commands and moving all four extremities without any apparent deficits.    COMPLICATIONS: none  CONDITION: stable  Leotis Pain  09/06/2019, 4:52 PM    This note was created with Dragon Medical transcription system. Any errors in dictation are purely unintentional.

## 2019-09-06 NOTE — Transfer of Care (Signed)
Immediate Anesthesia Transfer of Care Note  Patient: TROTTER REMSON  Procedure(s) Performed: ENDARTERECTOMY CAROTID (Left )  Patient Location: PACU  Anesthesia Type:General  Level of Consciousness: awake, alert  and oriented  Airway & Oxygen Therapy: Patient connected to face mask oxygen  Post-op Assessment: Post -op Vital signs reviewed and stable  Post vital signs: stable  Last Vitals:  Vitals Value Taken Time  BP 169/70 09/06/19 1743  Temp 36 C 09/06/19 1723  Pulse 52 09/06/19 1745  Resp 17 09/06/19 1745  SpO2 100 % 09/06/19 1745  Vitals shown include unvalidated device data.  Last Pain:  Vitals:   09/06/19 1723  TempSrc:   PainSc: 0-No pain         Complications: No apparent anesthesia complications

## 2019-09-06 NOTE — Anesthesia Preprocedure Evaluation (Addendum)
Anesthesia Evaluation  Patient identified by MRN, date of birth, ID band Patient awake    Reviewed: Allergy & Precautions, NPO status , Patient's Chart, lab work & pertinent test results  History of Anesthesia Complications Negative for: history of anesthetic complications  Airway Mallampati: III  TM Distance: >3 FB Neck ROM: Full    Dental no notable dental hx. (+) Teeth Intact, Missing, Dental Advisory Given,    Pulmonary neg pulmonary ROS, neg sleep apnea, neg COPD, Patient abstained from smoking.Not current smoker, former smoker,    Pulmonary exam normal breath sounds clear to auscultation       Cardiovascular Exercise Tolerance: Good METShypertension, + CAD, + Past MI, + Cardiac Stents and +CHF  (-) dysrhythmias  Rhythm:Regular Rate:Normal - Systolic murmurs TTE [id number]: Left ventricle: The cavity size was moderately dilated. Systolic  function was mildly to moderately reduced. The estimated ejection  fraction was 45%. Diffuse hypokinesis. The study is not  technically sufficient to allow evaluation of LV diastolic  function.  - Aortic valve: Valve area (Vmax): 2.21 cm^2.  - Mitral valve: There was mild regurgitation.  - Left atrium: The atrium was mildly dilated.  - Right ventricle: The cavity size was moderately dilated.  - Right atrium: The atrium was mildly dilated.    Neuro/Psych negative neurological ROS  negative psych ROS   GI/Hepatic GERD  ,(+)     (-) substance abuse  ,   Endo/Other  diabetes, Insulin Dependent  Renal/GU CRFRenal diseasenegative Renal ROS     Musculoskeletal   Abdominal   Peds  Hematology   Anesthesia Other Findings Past Medical History: No date: Arthritis No date: Chronic kidney disease     Comment:  KIDNEY TRANSPLANT No date: Coronary artery disease No date: Diabetes mellitus without complication (HCC) No date: Dyspnea No date: GERD (gastroesophageal reflux  disease) No date: History of blood transfusion No date: Hypertension No date: Myocardial infarction ([place])  Reproductive/Obstetrics                           Anesthesia Physical Anesthesia Plan  ASA: III  Anesthesia Plan: General   Post-op Pain Management:    Induction: Intravenous  PONV Risk Score and Plan: 3 and Ondansetron, Dexamethasone and TIVA  Airway Management Planned: Oral ETT  Additional Equipment: None and Arterial line  Intra-op Plan:   Post-operative Plan: Extubation in OR  Informed Consent: I have reviewed the patients History and Physical, chart, labs and discussed the procedure including the risks, benefits and alternatives for the proposed anesthesia with the patient or authorized representative who has indicated his/her understanding and acceptance.     Dental advisory given  Plan Discussed with: CRNA and Surgeon  Anesthesia Plan Comments: (Discussed risks of anesthesia with patient, including PONV, sore throat, lip/dental damage. Rare risks discussed as well, such as cardiorespiratory sequelae. Patient understands. Patient counseled on being high risk for anesthetic due to cardiac history. Patient was told about increased risk of cardiac and respiratory events, including death. Patient understands.   Patient's cardiologist apparently spoke with periop team member saying patient is optimized for anesthesia. Physical fax sheet stating this is on physical record chart.)        Anesthesia Quick Evaluation

## 2019-09-06 NOTE — Anesthesia Procedure Notes (Signed)
Arterial Line Insertion Start/End2/11/2019 2:55 PM, 09/06/2019 3:00 PM Performed by: Arita Miss, MD, Aline Brochure, CRNA, CRNA  Patient location: OR. Preanesthetic checklist: patient identified, IV checked, risks and benefits discussed, surgical consent, monitors and equipment checked, pre-op evaluation and timeout performed Patient sedated Right, radial was placed Catheter size: 20 G Hand hygiene performed   Attempts: 1 Procedure performed without using ultrasound guided technique. Following insertion, Biopatch and dressing applied. Patient tolerated the procedure well with no immediate complications.

## 2019-09-06 NOTE — Anesthesia Procedure Notes (Signed)
Procedure Name: Intubation Date/Time: 09/06/2019 2:51 PM Performed by: Aline Brochure, CRNA Pre-anesthesia Checklist: Patient identified, Emergency Drugs available, Suction available and Patient being monitored Patient Re-evaluated:Patient Re-evaluated prior to induction Oxygen Delivery Method: Circle system utilized Preoxygenation: Pre-oxygenation with 100% oxygen Induction Type: IV induction Ventilation: Mask ventilation without difficulty Laryngoscope Size: McGraph and 4 Grade View: Grade I Tube type: Oral Tube size: 7.5 mm Number of attempts: 1 Airway Equipment and Method: Stylet,  Video-laryngoscopy and LTA kit utilized Placement Confirmation: ETT inserted through vocal cords under direct vision,  positive ETCO2 and breath sounds checked- equal and bilateral Secured at: 21 cm Tube secured with: Tape Dental Injury: Teeth and Oropharynx as per pre-operative assessment  Difficulty Due To: Difficulty was anticipated

## 2019-09-06 NOTE — H&P (Signed)
[facility] History & Physical Update  The patient was interviewed and re-examined.  The patient's previous History and Physical has been reviewed and is unchanged.  There is no change in the plan of care. We plan to proceed with the scheduled procedure.  Leotis Pain, MD  09/06/2019, 2:18 PM

## 2019-09-07 LAB — GLUCOSE, CAPILLARY
Glucose-Capillary: 160 mg/dL — ABNORMAL HIGH (ref 70–99)
Glucose-Capillary: 104 mg/dL — ABNORMAL HIGH (ref 70–99)

## 2019-09-07 LAB — BASIC METABOLIC PANEL
Anion gap: 9 (ref 5–15)
BUN: 22 mg/dL (ref 8–23)
CO2: 21 mmol/L — ABNORMAL LOW (ref 22–32)
Calcium: 8 mg/dL — ABNORMAL LOW (ref 8.9–10.3)
Chloride: 107 mmol/L (ref 98–111)
Creatinine, Ser: 0.88 mg/dL (ref 0.61–1.24)
GFR calc Af Amer: >60 mL/min (ref >60)
GFR calc non Af Amer: >60 mL/min (ref >60)
Glucose, Bld: 175 mg/dL — ABNORMAL HIGH (ref 70–99)
Potassium: 3.9 mmol/L (ref 3.5–5.1)
Sodium: 137 mmol/L (ref 135–145)

## 2019-09-07 LAB — CBC
HCT: 41.7 % (ref 39.0–52.0)
Hemoglobin: 14.1 g/dL (ref 13.0–17.0)
MCH: 30 pg (ref 26.0–34.0)
MCHC: 33.8 g/dL (ref 30.0–36.0)
MCV: 88.7 fL (ref 80.0–100.0)
Platelets: 142 10*3/uL — ABNORMAL LOW (ref 150–400)
RBC: 4.7 MIL/uL (ref 4.22–5.81)
RDW: 12.9 % (ref 11.5–15.5)
WBC: 13.1 10*3/uL — ABNORMAL HIGH (ref 4.0–10.5)
nRBC: 0 % (ref 0.0–0.2)

## 2019-09-07 MED ORDER — METOCLOPRAMIDE HCL 5 MG/ML IJ SOLN: 10.0000 mg | Freq: Once | INTRAMUSCULAR | Status: DC

## 2019-09-07 MED ORDER — OXYCODONE-ACETAMINOPHEN 5-325 MG PO TABS: 1.0000 | ORAL_TABLET | Freq: Four times a day (QID) | ORAL | 0 refills | Status: DC | PRN

## 2019-09-07 MED ORDER — OXYCODONE-ACETAMINOPHEN 5-325 MG PO TABS: 1.0000 | ORAL_TABLET | Freq: Four times a day (QID) | ORAL | Status: DC | PRN

## 2019-09-07 MED ORDER — ASPIRIN 81 MG PO TBEC: 81.0000 mg | DELAYED_RELEASE_TABLET | Freq: Every day | ORAL | Status: AC

## 2019-09-07 NOTE — Anesthesia Postprocedure Evaluation (Addendum)
Anesthesia Post Note  Patient: [name]  Procedure(s) Performed: ENDARTERECTOMY CAROTID (Left )  Patient location during evaluation: SICU Anesthesia Type: General Level of consciousness: awake and alert Pain management: pain level controlled Vital Signs Assessment: post-procedure vital signs reviewed and stable Respiratory status: spontaneous breathing Cardiovascular status: stable Postop Assessment: no apparent nausea or vomiting Anesthetic complications: no     Last Vitals:  Vitals:   09/07/19 0500 09/07/19 0600  BP: (!) 107/52 110/69  Pulse: (!) 103 97  Resp: 15 (!) 0  Temp:    SpO2: 94% 97%    Last Pain:  Vitals:   09/07/19 0400  TempSrc: Oral  PainSc:                  Caryl Asp

## 2019-09-07 NOTE — Discharge Summary (Signed)
[facility]    Discharge Summary  Patient ID:  [name] MRN: [id number] DOB/AGE: May 14, 1956 64 y.o.  Admit date: 09/06/2019 Discharge date: 09/07/2019 Date of Surgery: 09/06/2019 Surgeon: Surgeon(s): Algernon Huxley, MD  Admission Diagnosis: Carotid stenosis, left [I65.22]  Discharge Diagnoses:  Carotid stenosis, left [I65.22]  Secondary Diagnoses: Past Medical History:  Diagnosis Date  . Arthritis   . Chronic kidney disease    KIDNEY TRANSPLANT  . Coronary artery disease   . Diabetes mellitus without complication ([place])   . Dyspnea   . GERD (gastroesophageal reflux disease)   . History of blood transfusion   . Hypertension   . Myocardial infarction [facility])    Procedure(s): ENDARTERECTOMY CAROTID  Discharged Condition: Good  HPI / Hospital Course:  [name] is a 64 y.o. male who presents with left carotid stenosis of at least 70%.  I discussed with the patient the risks, benefits, and alternatives to carotid endarterectomy.  I discussed the differences between carotid stenting and carotid endarterectomy. I discussed the procedural details of carotid endarterectomy with the patient.  The patient is aware that the risks of carotid endarterectomy include but are not limited to: bleeding, infection, stroke, myocardial infarction, death, cranial nerve injuries both temporary and permanent, neck hematoma, possible airway compromise, labile blood pressure post-operatively, cerebral hyperperfusion syndrome, and possible need for additional interventions in the future. The patient is aware of the risks and agrees to proceed forward with the procedure. On 09/06/19, the patient underwent:  1.  Left carotid endarterectomy with CorMatrix arterial patch reconstruction  The patient tolerated the procedure well was transferred from the recovery room to the ICU for observation overnight.  The patient is not of surgery was unremarkable.  POD#1, the patient's  diet was advanced, his Foley was removed and he was urinating independently, his pain was controlled with the use of p.o. pain medication and he was ambulating at baseline.  Upon discharge, the patient was afebrile with stable vital signs and essentially unremarkable physical exam.  Extubated: POD # 0  Physical exam:  Alert and oriented x3, no acute distress Face: Symmetrical.  Tongue midline. Neck:  Trachea midline.  Incision: Minimal ecchymosis noted distally.  No swelling or drainage noted.  Dermabond is intact.  Clean dry intact. Cardiovascular: Regular rate and rhythm Pulmonary: Clear to auscultation bilaterally Abdomen: Soft, nontender, nondistended Neuro: 5/5 upper/lower, left/right Vascular:   Lower extremity: Distally to toes.  Motor/sensory intact.  Good capillary refill.  Labs as below  Complications: None  Consults: None  Significant Diagnostic Studies: CBC Lab Results  Component Value Date   WBC 13.1 (H) 09/07/2019   HGB 14.1 09/07/2019   HCT 41.7 09/07/2019   MCV 88.7 09/07/2019   PLT 142 (L) 09/07/2019   BMET    Component Value Date/Time   NA 137 09/07/2019 0334   K 3.9 09/07/2019 0334   K 4.8 08/29/2012 0954   CL 107 09/07/2019 0334   CO2 21 (L) 09/07/2019 0334   GLUCOSE 175 (H) 09/07/2019 0334   BUN 22 09/07/2019 0334   CREATININE 0.88 09/07/2019 0334   CALCIUM 8.0 (L) 09/07/2019 0334   CALCIUM 8.0 (L) 11/11/2010 2120   GFRNONAA >60 09/07/2019 0334   GFRAA >60 09/07/2019 0334   COAG Lab Results  Component Value Date   INR 1.3 (H) 09/05/2019   INR 1.13 09/20/2017   INR 1.29 12/07/2010   Disposition:  Discharge to :Home  Allergies as of 09/07/2019   No  Known Allergies     Medication List    TAKE these medications   Advocate Insulin Pen Needles 33G X 4 MM Misc Generic drug: Insulin Pen Needle To use with insulin pen 4 times per day. E 13.9   amLODipine 10 MG tablet Commonly known as: NORVASC Take 10 mg by mouth daily.   aspirin 81  MG EC tablet Take 1 tablet (81 mg total) by mouth daily at 6 (six) AM. Start taking on: September 08, 2019   atorvastatin 40 MG tablet Commonly known as: LIPITOR 40 mg daily at 6 PM.   carvedilol 3.125 MG tablet Commonly known as: COREG 2 (two) times daily with a meal.   CellCept 250 MG capsule Generic drug: mycophenolate Take by mouth.   chlorthalidone 25 MG tablet Commonly known as: HYGROTON Take 25 mg by mouth daily.   Eliquis 5 MG Tabs tablet Generic drug: apixaban   HumaLOG KwikPen 100 UNIT/ML KwikPen Generic drug: insulin lispro Inject 10-12 Units into the skin 3 (three) times daily. Inject 10u daily at breakfast, 10u at lunch and up to 12u at supper per SSI   Lantus SoloStar 100 UNIT/ML Solostar Pen Generic drug: Insulin Glargine Inject 24 Units into the skin at bedtime.   losartan 50 MG tablet Commonly known as: COZAAR   omeprazole 20 MG capsule Commonly known as: PRILOSEC Take 20 mg by mouth daily as needed.   oxyCODONE-acetaminophen 5-325 MG tablet Commonly known as: PERCOCET/ROXICET Take 1 tablet by mouth every 6 (six) hours as needed for moderate pain.   Prograf 1 MG capsule Generic drug: tacrolimus Take 1 mg by mouth 2 (two) times daily.   tamsulosin 0.4 MG Caps capsule Commonly known as: FLOMAX Take 0.4 mg by mouth 2 (two) times daily.   Vitamin D3 50 MCG (2000 UT) capsule Take by mouth.      Verbal and written Discharge instructions given to the patient. Wound care per Discharge AVS Follow-up Information    Kris Hartmann, NP Follow up in 1 week(s).   Specialty: Vascular Surgery Why: First post-op. Incision check.  Contact information: [place] [address] [phone]          Signed: Sela Hua, PA-C  09/07/2019, 10:16 AM

## 2019-09-07 NOTE — Progress Notes (Addendum)
Pt vomitted up percocet this am, pill visualized. Repeat dose administered per MAR. Pt vomitted a second time, vomit visible. Pt requesting more pain medication. PRN zofran given at this time. RN explained to pt need to eat & tolerate food at this time, and will readdress pain. Pt agreeable.

## 2019-09-07 NOTE — Discharge Instructions (Signed)
1) you may shower as of tomorrow.  Gently clean your incision with soap and gently pat dry.  Dermabond (purple glue covering the incision) will fall off on its own over the next one to two weeks. 2) do not use a razor to shave your neck.  Please use clippers. 3) no driving until you are cleared during your first postop visit. 4) no strenuous activity or heavy lifting greater than 10 pounds until you are cleared at your first postop visit.

## 2019-09-07 NOTE — Addendum Note (Signed)
Addendum  created 09/07/19 [id number] by Caryl Asp, CRNA   Clinical Note Signed

## 2019-09-10 LAB — SURGICAL PATHOLOGY

## 2019-09-11 ENCOUNTER — Encounter (INDEPENDENT_AMBULATORY_CARE_PROVIDER_SITE_OTHER): Payer: Self-pay

## 2019-09-12 ENCOUNTER — Other Ambulatory Visit: Payer: Self-pay

## 2019-09-12 ENCOUNTER — Ambulatory Visit (INDEPENDENT_AMBULATORY_CARE_PROVIDER_SITE_OTHER): Payer: Medicare Other | Admitting: Nurse Practitioner

## 2019-09-12 ENCOUNTER — Encounter (INDEPENDENT_AMBULATORY_CARE_PROVIDER_SITE_OTHER): Payer: Self-pay | Admitting: Nurse Practitioner

## 2019-09-12 VITALS — BP 180/68 | Resp 12 | Ht 68.0 in | Wt 162.0 lb

## 2019-09-12 DIAGNOSIS — I6522 Occlusion and stenosis of left carotid artery: Secondary | ICD-10-CM

## 2019-09-13 ENCOUNTER — Encounter (INDEPENDENT_AMBULATORY_CARE_PROVIDER_SITE_OTHER): Payer: Self-pay | Admitting: Nurse Practitioner

## 2019-09-14 NOTE — Progress Notes (Signed)
SUBJECTIVE:  Patient ID: [name], male    DOB: 30-Mar-1956, 64 y.o.   MRN: [id number] Chief Complaint  Patient presents with  . Follow-up    1 week post op incision check    HPI  [name] is a 64 y.o. male the presents today 1 week following left carotid endarterectomy.  Patient denies any fever, chills, nausea, vomiting or diarrhea.  The wound looks well approximated.  The swelling is minimal at this time.  Overall the patient's incision looks well and he is progressing well following surgery.  Interpreter is present.  Past Medical History:  Diagnosis Date  . Arthritis   . Chronic kidney disease    KIDNEY TRANSPLANT  . Coronary artery disease   . Diabetes mellitus without complication ([place])   . Dyspnea   . GERD (gastroesophageal reflux disease)   . History of blood transfusion   . Hypertension   . Myocardial infarction [facility])     Past Surgical History:  Procedure Laterality Date  . CATARACT EXTRACTION W/PHACO Right 02/03/2017   Procedure: CATARACT EXTRACTION PHACO AND INTRAOCULAR LENS PLACEMENT (IOC);  Surgeon: Leandrew Koyanagi, MD;  Location: [facility] ORS;  Service: Ophthalmology;  Laterality: Right;  [place] 00:35.8 AP% 12.8 CDE 4.57 Fluid lot # [id number]  . CORONARY ANGIOPLASTY     STENTS X 4  . CORONARY ARTERY BYPASS GRAFT    . ENDARTERECTOMY Left 09/06/2019   Procedure: ENDARTERECTOMY CAROTID;  Surgeon: Algernon Huxley, MD;  Location: [facility] ORS;  Service: Vascular;  Laterality: Left;  . EYE SURGERY Bilateral    cataract  . HIP SURGERY    . KIDNEY TRANSPLANT     2016    Social History   Socioeconomic History  . Marital status: Married    Spouse name: Not on file  . Number of children: Not on file  . Years of education: Not on file  . Highest education level: Not on file  Occupational History  . Not on file  Tobacco Use  . Smoking status: Former Research scientist (life sciences)  . Smokeless tobacco: Never Used  . Tobacco comment: 30 years ago  Substance and Sexual  Activity  . Alcohol use: No    Alcohol/week: 0.0 standard drinks  . Drug use: Never  . Sexual activity: Not on file  Other Topics Concern  . Not on file  Social History Narrative  . Not on file   Social Determinants of Health   Financial Resource Strain:   . Difficulty of Paying Living Expenses: Not on file  Food Insecurity:   . Worried About Charity fundraiser in the Last Year: Not on file  . Ran Out of Food in the Last Year: Not on file  Transportation Needs:   . Lack of Transportation (Medical): Not on file  . Lack of Transportation (Non-Medical): Not on file  Physical Activity:   . Days of Exercise per Week: Not on file  . Minutes of Exercise per Session: Not on file  Stress:   . Feeling of Stress : Not on file  Social Connections:   . Frequency of Communication with Friends and Family: Not on file  . Frequency of Social Gatherings with Friends and Family: Not on file  . Attends Religious Services: Not on file  . Active Member of Clubs or Organizations: Not on file  . Attends Archivist Meetings: Not on file  . Marital Status: Not on file  Intimate Partner Violence:   . Fear of Current or  Ex-Partner: Not on file  . Emotionally Abused: Not on file  . Physically Abused: Not on file  . Sexually Abused: Not on file    Family History  Problem Relation Age of Onset  . Drug abuse Sister   . Drug abuse Brother     No Known Allergies   Review of Systems   Review of Systems: Negative Unless Checked Constitutional: [] Weight loss  [] Fever  [] Chills Cardiac: [] Chest pain   []  Atrial Fibrillation  [] Palpitations   [] Shortness of breath when laying flat   [] Shortness of breath with exertion. [] Shortness of breath at rest Vascular:  [] Pain in legs with walking   [] Pain in legs with standing [] Pain in legs when laying flat   [] Claudication    [] Pain in feet when laying flat    [] History of DVT   [] Phlebitis   [] Swelling in legs   [] Varicose veins   [] Non-healing  ulcers Pulmonary:   [] Uses home oxygen   [] Productive cough   [] Hemoptysis   [] Wheeze  [] COPD   [] Asthma Neurologic:  [] Dizziness   [] Seizures  [] Blackouts [] History of stroke   [] History of TIA  [] Aphasia   [] Temporary Blindness   [] Weakness or numbness in arm   [] Weakness or numbness in leg Musculoskeletal:   [] Joint swelling   [] Joint pain   [] Low back pain  []  History of Knee Replacement [] Arthritis [] back Surgeries  []  Spinal Stenosis    Hematologic:  [] Easy bruising  [] Easy bleeding   [] Hypercoagulable state   [] Anemic Gastrointestinal:  [] Diarrhea   [] Vomiting  [] Gastroesophageal reflux/heartburn   [] Difficulty swallowing. [] Abdominal pain Genitourinary:  [] Chronic kidney disease   [] Difficult urination  [] Anuric   [] Blood in urine [] Frequent urination  [] Burning with urination   [] Hematuria Skin:  [] Rashes   [] Ulcers [] Wounds Psychological:  [] History of anxiety   []  History of major depression  []  Memory Difficulties      OBJECTIVE:   Physical Exam  BP (!) 180/68 (BP Location: Right Arm)   Resp 12   Ht 5\' 8"  (1.727 m)   Wt 162 lb (73.5 kg)   BMI 24.63 kg/m   Gen: WD/WN, NAD Head: [place]/AT, No temporalis wasting.  Ear/Nose/Throat: Hearing grossly intact, nares w/o erythema or drainage Eyes: PER, EOMI, sclera nonicteric.  Neck: Supple, no masses.  No JVD.  Pulmonary:  Good air movement, no use of accessory muscles.  Cardiac: RRR Vascular:  Well approximated incision, no evidence of bleeding minimal swelling Vessel Right Left  Radial Palpable Palpable   Gastrointestinal: soft, non-distended. No guarding/no peritoneal signs.  Musculoskeletal: M/S 5/5 throughout.  No deformity or atrophy.  Neurologic: Pain and light touch intact in extremities.  Symmetrical.  Speech is fluent. Motor exam as listed above. Psychiatric: Judgment intact, Mood & affect appropriate for pt's clinical situation. Dermatologic: No Venous rashes. No Ulcers Noted.  No changes consistent with  cellulitis. Lymph : No Cervical lymphadenopathy, no lichenification or skin changes of chronic lymphedema.       ASSESSMENT AND PLAN:  1. Carotid stenosis, left Patient will return in 4 to 6 weeks for follow-up and noninvasive studies.  Discussed with the patient that following endarterectomy hyperplasia is possible therefore we follow it fairly regularly and closely for the first year.  Patient and family understood.  Patient is advised to contact her office if there are any issues prior to his follow-up.   Current Outpatient Medications on File Prior to Visit  Medication Sig Dispense Refill  . amLODipine (NORVASC) 10 MG tablet  Take 10 mg by mouth daily.     [name] aspirin EC 81 MG EC tablet Take 1 tablet (81 mg total) by mouth daily at 6 (six) AM.    . atorvastatin (LIPITOR) 40 MG tablet 40 mg daily at 6 PM.     . carvedilol (COREG) 3.125 MG tablet 2 (two) times daily with a meal.     . chlorthalidone (HYGROTON) 25 MG tablet Take 25 mg by mouth daily.     . Cholecalciferol (VITAMIN D3) 50 MCG (2000 UT) capsule Take by mouth.    [name] 5 MG TABS tablet Take 5 mg by mouth 2 (two) times daily.     [name] HUMALOG KWIKPEN 100 UNIT/ML KiwkPen Inject 10-12 Units into the skin 3 (three) times daily. Inject 10u daily at breakfast, 10u at lunch and up to 12u at supper per SSI    . Insulin Pen Needle (ADVOCATE INSULIN PEN NEEDLES) 33G X 4 MM MISC To use with insulin pen 4 times per day. E 13.9    . LANTUS SOLOSTAR 100 UNIT/ML Solostar Pen Inject 24 Units into the skin at bedtime.     [name] losartan (COZAAR) 50 MG tablet Take 50 mg by mouth daily.     . mycophenolate (CELLCEPT) 250 MG capsule Take 500 mg by mouth 2 (two) times daily.     [name] omeprazole (PRILOSEC) 20 MG capsule Take 20 mg by mouth daily as needed.     [name] oxyCODONE-acetaminophen (PERCOCET/ROXICET) 5-325 MG tablet Take 1 tablet by mouth every 6 (six) hours as needed for moderate pain. 28 tablet 0  . PROGRAF 1 MG capsule Take 1 mg by mouth 2 (two)  times daily.     . tamsulosin (FLOMAX) 0.4 MG CAPS capsule Take 0.4 mg by mouth 2 (two) times daily.      No current facility-administered medications on file prior to visit.    There are no Patient Instructions on file for this visit. No follow-ups on file.   Kris Hartmann, NP  This note was completed with Sales executive.  Any errors are purely unintentional.

## 2019-09-20 ENCOUNTER — Ambulatory Visit: Payer: Medicare Other

## 2019-09-25 ENCOUNTER — Ambulatory Visit: Payer: Medicare Other | Attending: Internal Medicine

## 2019-09-25 DIAGNOSIS — Z23 Encounter for immunization: Secondary | ICD-10-CM

## 2019-09-25 NOTE — Progress Notes (Signed)
   Covid-19 Vaccination Clinic  Name:  [name]    MRN: [id number] DOB: [birth year]  09/25/2019  [name] was observed post Covid-19 immunization for 15 minutes without incidence. He was provided with Vaccine Information Sheet and instruction to access the V-Safe system.   [name] was instructed to call 911 with any severe reactions post vaccine: [name] Difficulty breathing  . Swelling of your face and throat  . A fast heartbeat  . A bad rash all over your body  . Dizziness and weakness    Immunizations Administered    Name Date Dose VIS Date Route   Moderna COVID-19 Vaccine 09/25/2019  4:09 PM 0.5 mL 07/03/2019 Intramuscular   Manufacturer: Moderna   Lot: [id number]   [id number]

## 2019-10-10 ENCOUNTER — Encounter (INDEPENDENT_AMBULATORY_CARE_PROVIDER_SITE_OTHER): Payer: Self-pay | Admitting: Nurse Practitioner

## 2019-10-10 ENCOUNTER — Ambulatory Visit (INDEPENDENT_AMBULATORY_CARE_PROVIDER_SITE_OTHER): Payer: Medicare Other

## 2019-10-10 ENCOUNTER — Ambulatory Visit (INDEPENDENT_AMBULATORY_CARE_PROVIDER_SITE_OTHER): Payer: Medicare Other | Admitting: Nurse Practitioner

## 2019-10-10 ENCOUNTER — Other Ambulatory Visit: Payer: Self-pay

## 2019-10-10 VITALS — BP 115/56 | HR 57 | Resp 16 | Wt 165.0 lb

## 2019-10-10 DIAGNOSIS — I10 Essential (primary) hypertension: Secondary | ICD-10-CM

## 2019-10-10 DIAGNOSIS — I6522 Occlusion and stenosis of left carotid artery: Secondary | ICD-10-CM

## 2019-10-15 ENCOUNTER — Encounter (INDEPENDENT_AMBULATORY_CARE_PROVIDER_SITE_OTHER): Payer: Self-pay | Admitting: Nurse Practitioner

## 2019-10-15 NOTE — Progress Notes (Signed)
SUBJECTIVE:  Patient ID: [name], male    DOB: 03-10-1956, 64 y.o.   MRN: [id number] Chief Complaint  Patient presents with  . Follow-up    ultrasound follow up    HPI  [name] is a 64 y.o. male The patient is seen for follow up evaluation of carotid stenosis status post left carotid endarterectomy on 10/10/2019.  There were no post operative problems or complications related to the surgery.  The patient denies neck or incisional pain.  The patient denies interval amaurosis fugax. There is no recent history of TIA symptoms or focal motor deficits. There is no prior documented CVA.  The patient denies headache.  The patient is taking enteric-coated aspirin 81 mg daily.  The patient has a history of coronary artery disease, no recent episodes of angina or shortness of breath. The patient denies PAD or claudication symptoms. There is a history of hyperlipidemia which is being treated with a statin.   Today the patient underwent noninvasive studies which show stenosis of 1 to 39% in his right ICA.  The left carotid artery is widely patent however there are moderately elevated systolic velocities.  Past Medical History:  Diagnosis Date  . Arthritis   . Chronic kidney disease    KIDNEY TRANSPLANT  . Coronary artery disease   . Diabetes mellitus without complication ([place])   . Dyspnea   . GERD (gastroesophageal reflux disease)   . History of blood transfusion   . Hypertension   . Myocardial infarction [facility])     Past Surgical History:  Procedure Laterality Date  . CATARACT EXTRACTION W/PHACO Right 02/03/2017   Procedure: CATARACT EXTRACTION PHACO AND INTRAOCULAR LENS PLACEMENT (IOC);  Surgeon: Leandrew Koyanagi, MD;  Location: [facility] ORS;  Service: Ophthalmology;  Laterality: Right;  [place] 00:35.8 AP% 12.8 CDE 4.57 Fluid lot # [id number]  . CORONARY ANGIOPLASTY     STENTS X 4  . CORONARY ARTERY BYPASS GRAFT    . ENDARTERECTOMY Left 09/06/2019   Procedure:  ENDARTERECTOMY CAROTID;  Surgeon: Algernon Huxley, MD;  Location: [facility] ORS;  Service: Vascular;  Laterality: Left;  . EYE SURGERY Bilateral    cataract  . HIP SURGERY    . KIDNEY TRANSPLANT     2016    Social History   Socioeconomic History  . Marital status: Married    Spouse name: Not on file  . Number of children: Not on file  . Years of education: Not on file  . Highest education level: Not on file  Occupational History  . Not on file  Tobacco Use  . Smoking status: Former Research scientist (life sciences)  . Smokeless tobacco: Never Used  . Tobacco comment: 30 years ago  Substance and Sexual Activity  . Alcohol use: No    Alcohol/week: 0.0 standard drinks  . Drug use: Never  . Sexual activity: Not on file  Other Topics Concern  . Not on file  Social History Narrative  . Not on file   Social Determinants of Health   Financial Resource Strain:   . Difficulty of Paying Living Expenses:   Food Insecurity:   . Worried About Charity fundraiser in the Last Year:   . Arboriculturist in the Last Year:   Transportation Needs:   . Film/video editor (Medical):   [name] Lack of Transportation (Non-Medical):   Physical Activity:   . Days of Exercise per Week:   . Minutes of Exercise per Session:   Stress:   .  Feeling of Stress :   Social Connections:   . Frequency of Communication with Friends and Family:   . Frequency of Social Gatherings with Friends and Family:   . Attends Religious Services:   . Active Member of Clubs or Organizations:   . Attends Archivist Meetings:   [name] Marital Status:   Intimate Partner Violence:   . Fear of Current or Ex-Partner:   . Emotionally Abused:   [name] Physically Abused:   . Sexually Abused:     Family History  Problem Relation Age of Onset  . Drug abuse Sister   . Drug abuse Brother     No Known Allergies   Review of Systems   Review of Systems: Negative Unless Checked Constitutional: [] Weight loss  [] Fever  [] Chills Cardiac: [] Chest pain    []  Atrial Fibrillation  [] Palpitations   [] Shortness of breath when laying flat   [] Shortness of breath with exertion. [] Shortness of breath at rest Vascular:  [] Pain in legs with walking   [] Pain in legs with standing [] Pain in legs when laying flat   [] Claudication    [] Pain in feet when laying flat    [] History of DVT   [] Phlebitis   [] Swelling in legs   [] Varicose veins   [] Non-healing ulcers Pulmonary:   [] Uses home oxygen   [] Productive cough   [] Hemoptysis   [] Wheeze  [] COPD   [] Asthma Neurologic:  [] Dizziness   [] Seizures  [] Blackouts [] History of stroke   [] History of TIA  [] Aphasia   [] Temporary Blindness   [] Weakness or numbness in arm   [] Weakness or numbness in leg Musculoskeletal:   [] Joint swelling   [] Joint pain   [] Low back pain  []  History of Knee Replacement [] Arthritis [] back Surgeries  []  Spinal Stenosis    Hematologic:  [] Easy bruising  [] Easy bleeding   [] Hypercoagulable state   [] Anemic Gastrointestinal:  [] Diarrhea   [] Vomiting  [] Gastroesophageal reflux/heartburn   [] Difficulty swallowing. [] Abdominal pain Genitourinary:  [x] Chronic kidney disease   [] Difficult urination  [] Anuric   [] Blood in urine [] Frequent urination  [] Burning with urination   [] Hematuria Skin:  [] Rashes   [] Ulcers [] Wounds Psychological:  [] History of anxiety   []  History of major depression  []  Memory Difficulties      OBJECTIVE:   Physical Exam  BP (!) 115/56 (BP Location: Right Arm)   Pulse (!) 57   Resp 16   Wt 165 lb (74.8 kg)   BMI 25.09 kg/m   Gen: WD/WN, NAD Head: [place]/AT, No temporalis wasting.  Ear/Nose/Throat: Hearing grossly intact, nares w/o erythema or drainage Eyes: PER, EOMI, sclera nonicteric.  Neck: Supple, no masses.  No JVD.  Pulmonary:  Good air movement, no use of accessory muscles.  Cardiac: RRR Vascular:  Well approximated incision on left neck.  Nearly healed.  Minor swelling Vessel Right Left  Radial Palpable Palpable   Gastrointestinal: soft, non-distended. No  guarding/no peritoneal signs.  Musculoskeletal: M/S 5/5 throughout.  No deformity or atrophy.  Neurologic: Pain and light touch intact in extremities.  Symmetrical.  Speech is fluent. Motor exam as listed above. Psychiatric: Judgment intact, Mood & affect appropriate for pt's clinical situation. Dermatologic: No Venous rashes. No Ulcers Noted.  No changes consistent with cellulitis. Lymph : No Cervical lymphadenopathy, no lichenification or skin changes of chronic lymphedema.       ASSESSMENT AND PLAN:  1. Carotid stenosis, left Recommend:  The patient is s/p successful left CEA  Duplex ultrasound preoperatively shows 1 to 39% contralateral  stenosis.  The elevated velocities are likely due to some continued swelling and inflammation.  Continue antiplatelet therapy as prescribed Continue management of CAD, HTN and Hyperlipidemia Healthy heart diet,  encouraged exercise at least 4 times per week  Follow up in 3 months with duplex ultrasound and physical exam based on the patient's carotid surgery   - VAS US CAROTID; Future  2. Essential hypertension Good blood pressure control today.  No changes needed.  If appropriate medications.   Current Outpatient Medications on File Prior to Visit  Medication Sig Dispense Refill  . amLODipine (NORVASC) 10 MG tablet Take 10 mg by mouth daily.     [name] aspirin EC 81 MG EC tablet Take 1 tablet (81 mg total) by mouth daily at 6 (six) AM.    . atorvastatin (LIPITOR) 40 MG tablet 40 mg daily at 6 PM.     . carvedilol (COREG) 3.125 MG tablet 2 (two) times daily with a meal.     . chlorthalidone (HYGROTON) 25 MG tablet Take 25 mg by mouth daily.     . Cholecalciferol (VITAMIN D3) 50 MCG (2000 UT) capsule Take by mouth.    [name] 5 MG TABS tablet Take 5 mg by mouth 2 (two) times daily.     [name] HUMALOG KWIKPEN 100 UNIT/ML KiwkPen Inject 10-12 Units into the skin 3 (three) times daily. Inject 10u daily at breakfast, 10u at lunch and up to 12u at  supper per SSI    . Insulin Pen Needle (ADVOCATE INSULIN PEN NEEDLES) 33G X 4 MM MISC To use with insulin pen 4 times per day. E 13.9    . LANTUS SOLOSTAR 100 UNIT/ML Solostar Pen Inject 24 Units into the skin at bedtime.     [name] losartan (COZAAR) 50 MG tablet Take 50 mg by mouth daily.     . mycophenolate (CELLCEPT) 250 MG capsule Take 500 mg by mouth 2 (two) times daily.     [name] omeprazole (PRILOSEC) 20 MG capsule Take 20 mg by mouth daily as needed.     [name] oxyCODONE-acetaminophen (PERCOCET/ROXICET) 5-325 MG tablet Take 1 tablet by mouth every 6 (six) hours as needed for moderate pain. 28 tablet 0  . PROGRAF 1 MG capsule Take 1 mg by mouth 2 (two) times daily.     . tamsulosin (FLOMAX) 0.4 MG CAPS capsule Take 0.4 mg by mouth 2 (two) times daily.      No current facility-administered medications on file prior to visit.    There are no Patient Instructions on file for this visit. No follow-ups on file.   Kris Hartmann, NP  This note was completed with Sales executive.  Any errors are purely unintentional.

## 2019-10-18 DIAGNOSIS — Z940 Kidney transplant status: Principal | ICD-10-CM

## 2019-10-18 DIAGNOSIS — Z0000 Encounter for general adult medical examination without abnormal findings: Principal | ICD-10-CM

## 2019-10-23 MED ORDER — LOSARTAN 50 MG TABLET
ORAL_TABLET | 3 refills | 0 days | Status: CP
Start: 2019-10-23 — End: ?

## 2019-11-21 DIAGNOSIS — E119 Type 2 diabetes mellitus without complications: Principal | ICD-10-CM

## 2019-11-21 DIAGNOSIS — E559 Vitamin D deficiency, unspecified: Principal | ICD-10-CM

## 2019-11-21 DIAGNOSIS — Z940 Kidney transplant status: Principal | ICD-10-CM

## 2019-11-21 DIAGNOSIS — Z79899 Other long term (current) drug therapy: Principal | ICD-10-CM

## 2019-11-23 ENCOUNTER — Encounter: Admit: 2019-11-23 | Discharge: 2019-11-24 | Payer: MEDICARE | Attending: Nephrology | Primary: Nephrology

## 2019-11-23 ENCOUNTER — Ambulatory Visit: Admit: 2019-11-23 | Discharge: 2019-11-24 | Payer: MEDICARE

## 2019-11-23 DIAGNOSIS — Z940 Kidney transplant status: Principal | ICD-10-CM

## 2019-11-23 DIAGNOSIS — Z79899 Other long term (current) drug therapy: Principal | ICD-10-CM

## 2019-11-23 DIAGNOSIS — E119 Type 2 diabetes mellitus without complications: Principal | ICD-10-CM

## 2019-11-23 DIAGNOSIS — E559 Vitamin D deficiency, unspecified: Principal | ICD-10-CM

## 2019-11-23 MED ORDER — LINAGLIPTIN 5 MG TABLET
ORAL_TABLET | Freq: Every day | ORAL | 3 refills | 180.00000 days | Status: CP
Start: 2019-11-23 — End: ?

## 2020-01-08 ENCOUNTER — Other Ambulatory Visit: Payer: Self-pay

## 2020-01-08 ENCOUNTER — Ambulatory Visit (INDEPENDENT_AMBULATORY_CARE_PROVIDER_SITE_OTHER): Payer: Medicare Other | Admitting: Nurse Practitioner

## 2020-01-08 ENCOUNTER — Encounter (INDEPENDENT_AMBULATORY_CARE_PROVIDER_SITE_OTHER): Payer: Self-pay | Admitting: Nurse Practitioner

## 2020-01-08 ENCOUNTER — Ambulatory Visit (INDEPENDENT_AMBULATORY_CARE_PROVIDER_SITE_OTHER): Payer: Medicare Other

## 2020-01-08 ENCOUNTER — Encounter (INDEPENDENT_AMBULATORY_CARE_PROVIDER_SITE_OTHER): Payer: Medicare Other | Admitting: Nurse Practitioner

## 2020-01-08 VITALS — BP 100/52 | Resp 16 | Ht 68.0 in | Wt 164.0 lb

## 2020-01-08 DIAGNOSIS — T829XXD Unspecified complication of cardiac and vascular prosthetic device, implant and graft, subsequent encounter: Secondary | ICD-10-CM | POA: Diagnosis not present

## 2020-01-08 DIAGNOSIS — E7800 Pure hypercholesterolemia, unspecified: Secondary | ICD-10-CM | POA: Diagnosis not present

## 2020-01-08 DIAGNOSIS — I10 Essential (primary) hypertension: Secondary | ICD-10-CM

## 2020-01-08 DIAGNOSIS — I6523 Occlusion and stenosis of bilateral carotid arteries: Secondary | ICD-10-CM

## 2020-01-08 DIAGNOSIS — I6522 Occlusion and stenosis of left carotid artery: Secondary | ICD-10-CM

## 2020-01-08 NOTE — Progress Notes (Signed)
Subjective:    Patient ID: [name], male    DOB: 1956/04/09, 64 y.o.   MRN: [id number] Chief Complaint  Patient presents with  . Follow-up    ultrasound    The patient is seen for follow up evaluation of carotid stenosis. The carotid stenosis followed by ultrasound.  Patient underwent left carotid endarterectomy on 09/06/2019.  The patient has been doing well since that time.  The patient denies amaurosis fugax. There is no recent history of TIA symptoms or focal motor deficits. There is no prior documented CVA.  The patient is taking enteric-coated aspirin 81 mg daily.  There is no history of migraine headaches. There is no history of seizures.  The patient has a history of coronary artery disease, no recent episodes of angina or shortness of breath. The patient denies PAD or claudication symptoms. There is a history of hyperlipidemia which is being treated with a statin.    Carotid Duplex done today shows 1 to 39% stenosis of the internal carotid artery.  No change compared to last study in 10/19/2019   Review of Systems  Neurological: Positive for weakness.  All other systems reviewed and are negative.      Objective:   Physical Exam Vitals reviewed.  HENT:     Head: Normocephalic.  Cardiovascular:     Rate and Rhythm: Normal rate and regular rhythm.     Pulses:          Radial pulses are 2+ on the left side.     Heart sounds: Normal heart sounds.     Arteriovenous access: left arteriovenous access is present.    Comments: Good thrill and bruit Pulmonary:     Effort: Pulmonary effort is normal.     Breath sounds: Normal breath sounds.  Neurological:     Mental Status: He is alert.     Gait: Gait abnormal.  Psychiatric:        Mood and Affect: Mood normal.        Behavior: Behavior normal.        Thought Content: Thought content normal.        Judgment: Judgment normal.     BP (!) 100/52 (BP Location: Right Arm)   Resp 16   Ht 5\' 8"  (1.727 m)    Wt 164 lb (74.4 kg)   BMI 24.94 kg/m   Past Medical History:  Diagnosis Date  . Arthritis   . Chronic kidney disease    KIDNEY TRANSPLANT  . Coronary artery disease   . Diabetes mellitus without complication ([place])   . Dyspnea   . GERD (gastroesophageal reflux disease)   . History of blood transfusion   . Hypertension   . Myocardial infarction [facility])     Social History   Socioeconomic History  . Marital status: Married    Spouse name: Not on file  . Number of children: Not on file  . Years of education: Not on file  . Highest education level: Not on file  Occupational History  . Not on file  Tobacco Use  . Smoking status: Former Research scientist (life sciences)  . Smokeless tobacco: Never Used  . Tobacco comment: 30 years ago  Substance and Sexual Activity  . Alcohol use: No    Alcohol/week: 0.0 standard drinks  . Drug use: Never  . Sexual activity: Not on file  Other Topics Concern  . Not on file  Social History Narrative  . Not on file   Social Determinants of Health  Financial Resource Strain:   . Difficulty of Paying Living Expenses:   Food Insecurity:   . Worried About Charity fundraiser in the Last Year:   . Arboriculturist in the Last Year:   Transportation Needs:   . Film/video editor (Medical):   [name] Lack of Transportation (Non-Medical):   Physical Activity:   . Days of Exercise per Week:   . Minutes of Exercise per Session:   Stress:   . Feeling of Stress :   Social Connections:   . Frequency of Communication with Friends and Family:   . Frequency of Social Gatherings with Friends and Family:   . Attends Religious Services:   . Active Member of Clubs or Organizations:   . Attends Archivist Meetings:   [name] Marital Status:   Intimate Partner Violence:   . Fear of Current or Ex-Partner:   . Emotionally Abused:   [name] Physically Abused:   . Sexually Abused:     Past Surgical History:  Procedure Laterality Date  . CATARACT EXTRACTION W/PHACO Right 02/03/2017    Procedure: CATARACT EXTRACTION PHACO AND INTRAOCULAR LENS PLACEMENT (IOC);  Surgeon: Leandrew Koyanagi, MD;  Location: [facility] ORS;  Service: Ophthalmology;  Laterality: Right;  [place] 00:35.8 AP% 12.8 CDE 4.57 Fluid lot # [id number]  . CORONARY ANGIOPLASTY     STENTS X 4  . CORONARY ARTERY BYPASS GRAFT    . ENDARTERECTOMY Left 09/06/2019   Procedure: ENDARTERECTOMY CAROTID;  Surgeon: Algernon Huxley, MD;  Location: [facility] ORS;  Service: Vascular;  Laterality: Left;  . EYE SURGERY Bilateral    cataract  . HIP SURGERY    . KIDNEY TRANSPLANT     2016    Family History  Problem Relation Age of Onset  . Drug abuse Sister   . Drug abuse Brother     No Known Allergies     Assessment & Plan:   1. Bilateral carotid artery stenosis Recommend:  Given the patient's asymptomatic subcritical stenosis no further invasive testing or surgery at this time.  Duplex ultrasound shows 1-39 stenosis bilaterally in the internal carotid arteries.  Continue antiplatelet therapy as prescribed Continue management of CAD, HTN and Hyperlipidemia Healthy heart diet,  encouraged exercise at least 4 times per week Per patient, Dr. Chancy Milroy the patient's cardiologist will manage and maintain patient's carotid artery stenosis  2. Hypercholesteremia Continue statin as ordered and reviewed, no changes at this time   3. Essential hypertension Continue antihypertensive medications as already ordered, these medications have been reviewed and there are no changes at this time.   4. Complication of arteriovenous dialysis fistula, subsequent encounter Patient has renal transplant and still working well.  Will follow up as normally scheduled.     Current Outpatient Medications on File Prior to Visit  Medication Sig Dispense Refill  . amLODipine (NORVASC) 10 MG tablet Take 10 mg by mouth daily.     [name] aspirin EC 81 MG EC tablet Take 1 tablet (81 mg total) by mouth daily at 6 (six) AM.    . atorvastatin (LIPITOR) 40 MG  tablet 40 mg daily at 6 PM.     . carvedilol (COREG) 3.125 MG tablet 2 (two) times daily with a meal.     . chlorthalidone (HYGROTON) 25 MG tablet Take 25 mg by mouth daily.     . Cholecalciferol (VITAMIN D3) 50 MCG (2000 UT) capsule Take by mouth.    [name] 5 MG TABS tablet Take 5 mg by  mouth 2 (two) times daily.     [name] HUMALOG KWIKPEN 100 UNIT/ML KiwkPen Inject 10-12 Units into the skin 3 (three) times daily. Inject 10u daily at breakfast, 10u at lunch and up to 12u at supper per SSI    . Insulin Pen Needle (ADVOCATE INSULIN PEN NEEDLES) 33G X 4 MM MISC To use with insulin pen 4 times per day. E 13.9    . LANTUS SOLOSTAR 100 UNIT/ML Solostar Pen Inject 24 Units into the skin at bedtime.     [name] linagliptin (TRADJENTA) 5 MG TABS tablet Take by mouth.    . losartan (COZAAR) 50 MG tablet Take 50 mg by mouth daily.     . mycophenolate (CELLCEPT) 250 MG capsule Take by mouth.    [name] omeprazole (PRILOSEC) 20 MG capsule Take 20 mg by mouth daily as needed.     [name] oxyCODONE-acetaminophen (PERCOCET/ROXICET) 5-325 MG tablet Take 1 tablet by mouth every 6 (six) hours as needed for moderate pain. 28 tablet 0  . PROGRAF 1 MG capsule Take 1 mg by mouth 2 (two) times daily.     . tamsulosin (FLOMAX) 0.4 MG CAPS capsule Take 0.4 mg by mouth 2 (two) times daily.      No current facility-administered medications on file prior to visit.    There are no Patient Instructions on file for this visit. No follow-ups on file.   Kris Hartmann, NP

## 2020-03-04 DIAGNOSIS — Z940 Kidney transplant status: Principal | ICD-10-CM

## 2020-03-04 MED ORDER — CELLCEPT 250 MG CAPSULE
ORAL_CAPSULE | Freq: Two times a day (BID) | ORAL | 3 refills | 90 days | Status: CP
Start: 2020-03-04 — End: 2021-03-04

## 2020-04-26 ENCOUNTER — Other Ambulatory Visit: Payer: Self-pay

## 2020-04-26 DIAGNOSIS — E785 Hyperlipidemia, unspecified: Secondary | ICD-10-CM | POA: Diagnosis present

## 2020-04-26 DIAGNOSIS — Z9861 Coronary angioplasty status: Secondary | ICD-10-CM

## 2020-04-26 DIAGNOSIS — I2510 Atherosclerotic heart disease of native coronary artery without angina pectoris: Secondary | ICD-10-CM | POA: Diagnosis present

## 2020-04-26 DIAGNOSIS — N419 Inflammatory disease of prostate, unspecified: Secondary | ICD-10-CM | POA: Diagnosis present

## 2020-04-26 DIAGNOSIS — N400 Enlarged prostate without lower urinary tract symptoms: Secondary | ICD-10-CM | POA: Diagnosis present

## 2020-04-26 DIAGNOSIS — I480 Paroxysmal atrial fibrillation: Secondary | ICD-10-CM | POA: Diagnosis present

## 2020-04-26 DIAGNOSIS — R000 Tachycardia, unspecified: Secondary | ICD-10-CM | POA: Diagnosis present

## 2020-04-26 DIAGNOSIS — Z961 Presence of intraocular lens: Secondary | ICD-10-CM | POA: Diagnosis present

## 2020-04-26 DIAGNOSIS — Z813 Family history of other psychoactive substance abuse and dependence: Secondary | ICD-10-CM

## 2020-04-26 DIAGNOSIS — N12 Tubulo-interstitial nephritis, not specified as acute or chronic: Secondary | ICD-10-CM | POA: Diagnosis present

## 2020-04-26 DIAGNOSIS — Z7901 Long term (current) use of anticoagulants: Secondary | ICD-10-CM

## 2020-04-26 DIAGNOSIS — Y830 Surgical operation with transplant of whole organ as the cause of abnormal reaction of the patient, or of later complication, without mention of misadventure at the time of the procedure: Secondary | ICD-10-CM | POA: Diagnosis present

## 2020-04-26 DIAGNOSIS — Z9842 Cataract extraction status, left eye: Secondary | ICD-10-CM

## 2020-04-26 DIAGNOSIS — A4151 Sepsis due to Escherichia coli [E. coli]: Secondary | ICD-10-CM | POA: Diagnosis not present

## 2020-04-26 DIAGNOSIS — T8613 Kidney transplant infection: Secondary | ICD-10-CM | POA: Diagnosis present

## 2020-04-26 DIAGNOSIS — Z951 Presence of aortocoronary bypass graft: Secondary | ICD-10-CM

## 2020-04-26 DIAGNOSIS — I6522 Occlusion and stenosis of left carotid artery: Secondary | ICD-10-CM | POA: Diagnosis present

## 2020-04-26 DIAGNOSIS — Z9841 Cataract extraction status, right eye: Secondary | ICD-10-CM

## 2020-04-26 DIAGNOSIS — N179 Acute kidney failure, unspecified: Secondary | ICD-10-CM | POA: Diagnosis present

## 2020-04-26 DIAGNOSIS — E7800 Pure hypercholesterolemia, unspecified: Secondary | ICD-10-CM | POA: Diagnosis present

## 2020-04-26 DIAGNOSIS — E1122 Type 2 diabetes mellitus with diabetic chronic kidney disease: Secondary | ICD-10-CM | POA: Diagnosis present

## 2020-04-26 DIAGNOSIS — I129 Hypertensive chronic kidney disease with stage 1 through stage 4 chronic kidney disease, or unspecified chronic kidney disease: Secondary | ICD-10-CM | POA: Diagnosis present

## 2020-04-26 DIAGNOSIS — Z7982 Long term (current) use of aspirin: Secondary | ICD-10-CM

## 2020-04-26 DIAGNOSIS — N189 Chronic kidney disease, unspecified: Secondary | ICD-10-CM | POA: Diagnosis present

## 2020-04-26 DIAGNOSIS — T8619 Other complication of kidney transplant: Secondary | ICD-10-CM | POA: Diagnosis not present

## 2020-04-26 DIAGNOSIS — K219 Gastro-esophageal reflux disease without esophagitis: Secondary | ICD-10-CM | POA: Diagnosis present

## 2020-04-26 DIAGNOSIS — Z79899 Other long term (current) drug therapy: Secondary | ICD-10-CM

## 2020-04-26 DIAGNOSIS — R6520 Severe sepsis without septic shock: Secondary | ICD-10-CM | POA: Diagnosis present

## 2020-04-26 DIAGNOSIS — Z794 Long term (current) use of insulin: Secondary | ICD-10-CM

## 2020-04-26 DIAGNOSIS — Z20822 Contact with and (suspected) exposure to covid-19: Secondary | ICD-10-CM | POA: Diagnosis present

## 2020-04-26 DIAGNOSIS — I252 Old myocardial infarction: Secondary | ICD-10-CM

## 2020-04-26 DIAGNOSIS — Z87891 Personal history of nicotine dependence: Secondary | ICD-10-CM

## 2020-04-26 LAB — COMPREHENSIVE METABOLIC PANEL
ALT: 20 U/L (ref 0–44)
AST: 25 U/L (ref 15–41)
Albumin: 4.2 g/dL (ref 3.5–5.0)
Alkaline Phosphatase: 43 U/L (ref 38–126)
Anion gap: 12 (ref 5–15)
BUN: 41 mg/dL — ABNORMAL HIGH (ref 8–23)
CO2: 20 mmol/L — ABNORMAL LOW (ref 22–32)
Calcium: 8.9 mg/dL (ref 8.9–10.3)
Chloride: 101 mmol/L (ref 98–111)
Creatinine, Ser: 1.66 mg/dL — ABNORMAL HIGH (ref 0.61–1.24)
GFR calc Af Amer: 49 mL/min — ABNORMAL LOW (ref >60)
GFR calc non Af Amer: 43 mL/min — ABNORMAL LOW (ref >60)
Glucose, Bld: 267 mg/dL — ABNORMAL HIGH (ref 70–99)
Potassium: 3.5 mmol/L (ref 3.5–5.1)
Sodium: 133 mmol/L — ABNORMAL LOW (ref 135–145)
Total Bilirubin: 1.2 mg/dL (ref 0.3–1.2)
Total Protein: 7.5 g/dL (ref 6.5–8.1)

## 2020-04-26 LAB — CBC WITH DIFFERENTIAL/PLATELET
Abs Immature Granulocytes: 0.07 10*3/uL (ref 0.00–0.07)
Basophils Absolute: 0 10*3/uL (ref 0.0–0.1)
Basophils Relative: 0 %
Eosinophils Absolute: 0.1 10*3/uL (ref 0.0–0.5)
Eosinophils Relative: 0 %
HCT: 41.7 % (ref 39.0–52.0)
Hemoglobin: 14.6 g/dL (ref 13.0–17.0)
Immature Granulocytes: 1 %
Lymphocytes Relative: 5 %
Lymphs Abs: 0.7 10*3/uL (ref 0.7–4.0)
MCH: 30.2 pg (ref 26.0–34.0)
MCHC: 35 g/dL (ref 30.0–36.0)
MCV: 86.3 fL (ref 80.0–100.0)
Monocytes Absolute: 0.7 10*3/uL (ref 0.1–1.0)
Monocytes Relative: 5 %
Neutro Abs: 12.8 10*3/uL — ABNORMAL HIGH (ref 1.7–7.7)
Neutrophils Relative %: 89 %
Platelets: 157 10*3/uL (ref 150–400)
RBC: 4.83 MIL/uL (ref 4.22–5.81)
RDW: 12.3 % (ref 11.5–15.5)
WBC: 14.4 10*3/uL — ABNORMAL HIGH (ref 4.0–10.5)
nRBC: 0 % (ref 0.0–0.2)

## 2020-04-26 LAB — URINALYSIS, COMPLETE (UACMP) WITH MICROSCOPIC
Bilirubin Urine: NEGATIVE
Glucose, UA: NEGATIVE mg/dL
Ketones, ur: NEGATIVE mg/dL
Nitrite: NEGATIVE
Protein, ur: >=300 mg/dL — AB
RBC / HPF: >50 RBC/hpf — ABNORMAL HIGH (ref 0–5)
Specific Gravity, Urine: 1.027 (ref 1.005–1.030)
WBC, UA: >50 WBC/hpf — ABNORMAL HIGH (ref 0–5)
pH: 5 (ref 5.0–8.0)

## 2020-04-26 NOTE — ED Triage Notes (Signed)
Pt presents via POV c/o urinary hesitancy. Reports fever, took Tylenol x1 hour ago. Reports chills.

## 2020-04-27 ENCOUNTER — Inpatient Hospital Stay
Admission: EM | Admit: 2020-04-27 | Discharge: 2020-04-29 | DRG: 872 | Disposition: A | Discharge status: Home / Self Care | Payer: Medicare Other | Attending: Internal Medicine | Admitting: Internal Medicine

## 2020-04-27 ENCOUNTER — Emergency Department: Payer: Medicare Other

## 2020-04-27 DIAGNOSIS — N12 Tubulo-interstitial nephritis, not specified as acute or chronic: Secondary | ICD-10-CM | POA: Diagnosis present

## 2020-04-27 DIAGNOSIS — K219 Gastro-esophageal reflux disease without esophagitis: Secondary | ICD-10-CM | POA: Diagnosis present

## 2020-04-27 DIAGNOSIS — I10 Essential (primary) hypertension: Secondary | ICD-10-CM

## 2020-04-27 DIAGNOSIS — T8619 Other complication of kidney transplant: Secondary | ICD-10-CM | POA: Diagnosis present

## 2020-04-27 DIAGNOSIS — Z951 Presence of aortocoronary bypass graft: Secondary | ICD-10-CM | POA: Diagnosis not present

## 2020-04-27 DIAGNOSIS — N400 Enlarged prostate without lower urinary tract symptoms: Secondary | ICD-10-CM | POA: Diagnosis present

## 2020-04-27 DIAGNOSIS — I252 Old myocardial infarction: Secondary | ICD-10-CM | POA: Diagnosis not present

## 2020-04-27 DIAGNOSIS — N390 Urinary tract infection, site not specified: Secondary | ICD-10-CM | POA: Diagnosis not present

## 2020-04-27 DIAGNOSIS — A4150 Gram-negative sepsis, unspecified: Secondary | ICD-10-CM | POA: Diagnosis present

## 2020-04-27 DIAGNOSIS — Z20822 Contact with and (suspected) exposure to covid-19: Secondary | ICD-10-CM | POA: Diagnosis present

## 2020-04-27 DIAGNOSIS — Z7901 Long term (current) use of anticoagulants: Secondary | ICD-10-CM | POA: Diagnosis not present

## 2020-04-27 DIAGNOSIS — I480 Paroxysmal atrial fibrillation: Secondary | ICD-10-CM | POA: Diagnosis present

## 2020-04-27 DIAGNOSIS — Z9841 Cataract extraction status, right eye: Secondary | ICD-10-CM | POA: Diagnosis not present

## 2020-04-27 DIAGNOSIS — E785 Hyperlipidemia, unspecified: Secondary | ICD-10-CM

## 2020-04-27 DIAGNOSIS — E1122 Type 2 diabetes mellitus with diabetic chronic kidney disease: Secondary | ICD-10-CM | POA: Diagnosis present

## 2020-04-27 DIAGNOSIS — Z9842 Cataract extraction status, left eye: Secondary | ICD-10-CM | POA: Diagnosis not present

## 2020-04-27 DIAGNOSIS — Z813 Family history of other psychoactive substance abuse and dependence: Secondary | ICD-10-CM | POA: Diagnosis not present

## 2020-04-27 DIAGNOSIS — I129 Hypertensive chronic kidney disease with stage 1 through stage 4 chronic kidney disease, or unspecified chronic kidney disease: Secondary | ICD-10-CM | POA: Diagnosis present

## 2020-04-27 DIAGNOSIS — R6520 Severe sepsis without septic shock: Secondary | ICD-10-CM | POA: Diagnosis present

## 2020-04-27 DIAGNOSIS — N179 Acute kidney failure, unspecified: Secondary | ICD-10-CM | POA: Diagnosis present

## 2020-04-27 DIAGNOSIS — Z87891 Personal history of nicotine dependence: Secondary | ICD-10-CM | POA: Diagnosis not present

## 2020-04-27 DIAGNOSIS — T8613 Kidney transplant infection: Secondary | ICD-10-CM | POA: Diagnosis present

## 2020-04-27 DIAGNOSIS — N189 Chronic kidney disease, unspecified: Secondary | ICD-10-CM | POA: Diagnosis present

## 2020-04-27 DIAGNOSIS — A4151 Sepsis due to Escherichia coli [E. coli]: Secondary | ICD-10-CM | POA: Diagnosis present

## 2020-04-27 DIAGNOSIS — A419 Sepsis, unspecified organism: Secondary | ICD-10-CM

## 2020-04-27 DIAGNOSIS — Z961 Presence of intraocular lens: Secondary | ICD-10-CM | POA: Diagnosis present

## 2020-04-27 DIAGNOSIS — Z9861 Coronary angioplasty status: Secondary | ICD-10-CM | POA: Diagnosis not present

## 2020-04-27 DIAGNOSIS — E7800 Pure hypercholesterolemia, unspecified: Secondary | ICD-10-CM | POA: Diagnosis present

## 2020-04-27 DIAGNOSIS — Y830 Surgical operation with transplant of whole organ as the cause of abnormal reaction of the patient, or of later complication, without mention of misadventure at the time of the procedure: Secondary | ICD-10-CM | POA: Diagnosis present

## 2020-04-27 HISTORY — DX: Gram-negative sepsis, unspecified: A41.50

## 2020-04-27 HISTORY — DX: Gram-negative sepsis, unspecified: N39.0

## 2020-04-27 LAB — RESPIRATORY PANEL BY RT PCR (FLU A&B, COVID)
Influenza A by PCR: NEGATIVE
Influenza B by PCR: NEGATIVE
SARS Coronavirus 2 by RT PCR: NEGATIVE

## 2020-04-27 LAB — GLUCOSE, CAPILLARY
Glucose-Capillary: 219 mg/dL — ABNORMAL HIGH (ref 70–99)
Glucose-Capillary: 180 mg/dL — ABNORMAL HIGH (ref 70–99)
Glucose-Capillary: 250 mg/dL — ABNORMAL HIGH (ref 70–99)
Glucose-Capillary: 206 mg/dL — ABNORMAL HIGH (ref 70–99)
Glucose-Capillary: 175 mg/dL — ABNORMAL HIGH (ref 70–99)

## 2020-04-27 LAB — PROTIME-INR
INR: 1.5 — ABNORMAL HIGH (ref 0.8–1.2)
Prothrombin Time: 17.5 seconds — ABNORMAL HIGH (ref 11.4–15.2)

## 2020-04-27 LAB — CBC
HCT: 36.8 % — ABNORMAL LOW (ref 39.0–52.0)
Hemoglobin: 12.8 g/dL — ABNORMAL LOW (ref 13.0–17.0)
MCH: 30.2 pg (ref 26.0–34.0)
MCHC: 34.8 g/dL (ref 30.0–36.0)
MCV: 86.8 fL (ref 80.0–100.0)
Platelets: 112 10*3/uL — ABNORMAL LOW (ref 150–400)
RBC: 4.24 MIL/uL (ref 4.22–5.81)
RDW: 12.6 % (ref 11.5–15.5)
WBC: 12.5 10*3/uL — ABNORMAL HIGH (ref 4.0–10.5)
nRBC: 0 % (ref 0.0–0.2)

## 2020-04-27 LAB — BASIC METABOLIC PANEL
Anion gap: 11 (ref 5–15)
BUN: 36 mg/dL — ABNORMAL HIGH (ref 8–23)
CO2: 20 mmol/L — ABNORMAL LOW (ref 22–32)
Calcium: 8 mg/dL — ABNORMAL LOW (ref 8.9–10.3)
Chloride: 101 mmol/L (ref 98–111)
Creatinine, Ser: 1.41 mg/dL — ABNORMAL HIGH (ref 0.61–1.24)
GFR calc Af Amer: >60 mL/min (ref >60)
GFR calc non Af Amer: 52 mL/min — ABNORMAL LOW (ref >60)
Glucose, Bld: 213 mg/dL — ABNORMAL HIGH (ref 70–99)
Potassium: 3.2 mmol/L — ABNORMAL LOW (ref 3.5–5.1)
Sodium: 132 mmol/L — ABNORMAL LOW (ref 135–145)

## 2020-04-27 LAB — PROCALCITONIN
Procalcitonin: 0.54 ng/mL
Procalcitonin: 0.54 ng/mL

## 2020-04-27 LAB — LACTIC ACID, PLASMA
Lactic Acid, Venous: 1 mmol/L (ref 0.5–1.9)
Lactic Acid, Venous: 1.7 mmol/L (ref 0.5–1.9)

## 2020-04-27 LAB — CORTISOL-AM, BLOOD: Cortisol - AM: 37.8 ug/dL — ABNORMAL HIGH (ref 6.7–22.6)

## 2020-04-27 LAB — HIV ANTIBODY (ROUTINE TESTING W REFLEX): HIV Screen 4th Generation wRfx: NONREACTIVE

## 2020-04-27 LAB — HEMOGLOBIN A1C
Hgb A1c MFr Bld: 6.5 % — ABNORMAL HIGH (ref 4.8–5.6)
Mean Plasma Glucose: 139.85 mg/dL

## 2020-04-27 MED ORDER — LACTATED RINGERS IV BOLUS
1000.0000 mL | Freq: Once | INTRAVENOUS | Status: AC
  Administered 2020-04-27: 1000 mL via INTRAVENOUS

## 2020-04-27 MED ORDER — TRAZODONE HCL 50 MG PO TABS: 25.0000 mg | ORAL_TABLET | Freq: Every evening | ORAL | Status: DC | PRN

## 2020-04-27 MED ORDER — MYCOPHENOLATE MOFETIL 250 MG PO CAPS
250.0000 mg | ORAL_CAPSULE | Freq: Two times a day (BID) | ORAL | Status: DC
  Administered 2020-04-27 – 2020-04-29 (×5): 250 mg via ORAL
  Filled 2020-04-27 (×7): qty 1

## 2020-04-27 MED ORDER — CARVEDILOL 3.125 MG PO TABS
3.1250 mg | ORAL_TABLET | Freq: Two times a day (BID) | ORAL | Status: DC
  Administered 2020-04-27 – 2020-04-29 (×5): 3.125 mg via ORAL
  Filled 2020-04-27 (×5): qty 1

## 2020-04-27 MED ORDER — APIXABAN 5 MG PO TABS
5.0000 mg | ORAL_TABLET | Freq: Two times a day (BID) | ORAL | Status: DC
  Administered 2020-04-27 – 2020-04-29 (×5): 5 mg via ORAL
  Filled 2020-04-27 (×5): qty 1

## 2020-04-27 MED ORDER — OXYCODONE-ACETAMINOPHEN 5-325 MG PO TABS: 1.0000 | ORAL_TABLET | Freq: Four times a day (QID) | ORAL | Status: DC | PRN

## 2020-04-27 MED ORDER — INSULIN GLARGINE 100 UNIT/ML ~~LOC~~ SOLN
24.0000 [IU] | Freq: Every day | SUBCUTANEOUS | Status: DC
  Administered 2020-04-27 – 2020-04-28 (×2): 24 [IU] via SUBCUTANEOUS
  Filled 2020-04-27 (×3): qty 0.24

## 2020-04-27 MED ORDER — VITAMIN D3 25 MCG (1000 UNIT) PO TABS
2000.0000 [IU] | ORAL_TABLET | Freq: Every day | ORAL | Status: DC
  Administered 2020-04-27 – 2020-04-29 (×3): 2000 [IU] via ORAL
  Filled 2020-04-27 (×6): qty 2

## 2020-04-27 MED ORDER — ONDANSETRON HCL 4 MG PO TABS: 4.0000 mg | ORAL_TABLET | Freq: Four times a day (QID) | ORAL | Status: DC | PRN

## 2020-04-27 MED ORDER — AMLODIPINE BESYLATE 10 MG PO TABS
10.0000 mg | ORAL_TABLET | Freq: Every day | ORAL | Status: DC
  Administered 2020-04-27 – 2020-04-29 (×3): 10 mg via ORAL
  Filled 2020-04-27 (×2): qty 1
  Filled 2020-04-27: qty 2

## 2020-04-27 MED ORDER — TACROLIMUS 1 MG PO CAPS
1.0000 mg | ORAL_CAPSULE | Freq: Two times a day (BID) | ORAL | Status: DC
  Administered 2020-04-27 – 2020-04-29 (×5): 1 mg via ORAL
  Filled 2020-04-27 (×7): qty 1

## 2020-04-27 MED ORDER — MAGNESIUM HYDROXIDE 400 MG/5ML PO SUSP: 30.0000 mL | Freq: Every day | ORAL | Status: DC | PRN

## 2020-04-27 MED ORDER — INSULIN ASPART 100 UNIT/ML ~~LOC~~ SOLN
0.0000 [IU] | Freq: Three times a day (TID) | SUBCUTANEOUS | Status: DC
  Administered 2020-04-27 (×2): 5 [IU] via SUBCUTANEOUS
  Administered 2020-04-27 (×2): 3 [IU] via SUBCUTANEOUS
  Administered 2020-04-28: 8 [IU] via SUBCUTANEOUS
  Administered 2020-04-28: 3 [IU] via SUBCUTANEOUS
  Administered 2020-04-28: 2 [IU] via SUBCUTANEOUS
  Administered 2020-04-29: 5 [IU] via SUBCUTANEOUS
  Filled 2020-04-27 (×8): qty 1

## 2020-04-27 MED ORDER — SODIUM CHLORIDE 0.9 % IV SOLN
1.0000 g | Freq: Once | INTRAVENOUS | Status: AC
  Administered 2020-04-27: 1 g via INTRAVENOUS
  Filled 2020-04-27: qty 10

## 2020-04-27 MED ORDER — ENOXAPARIN SODIUM 40 MG/0.4ML ~~LOC~~ SOLN: 40.0000 mg | SUBCUTANEOUS | Status: DC

## 2020-04-27 MED ORDER — ASPIRIN EC 81 MG PO TBEC
81.0000 mg | DELAYED_RELEASE_TABLET | Freq: Every day | ORAL | Status: DC
  Administered 2020-04-27 – 2020-04-29 (×3): 81 mg via ORAL
  Filled 2020-04-27 (×3): qty 1

## 2020-04-27 MED ORDER — ONDANSETRON HCL 4 MG/2ML IJ SOLN: 4.0000 mg | Freq: Four times a day (QID) | INTRAMUSCULAR | Status: DC | PRN

## 2020-04-27 MED ORDER — ACETAMINOPHEN 650 MG RE SUPP: 650.0000 mg | Freq: Four times a day (QID) | RECTAL | Status: DC | PRN

## 2020-04-27 MED ORDER — ACETAMINOPHEN 325 MG PO TABS
650.0000 mg | ORAL_TABLET | Freq: Four times a day (QID) | ORAL | Status: DC | PRN
  Administered 2020-04-27 – 2020-04-28 (×3): 650 mg via ORAL
  Filled 2020-04-27 (×3): qty 2

## 2020-04-27 MED ORDER — LACTATED RINGERS IV SOLN: INTRAVENOUS | Status: DC

## 2020-04-27 MED ORDER — SODIUM CHLORIDE 0.9 % IV SOLN
1.0000 g | INTRAVENOUS | Status: DC
  Administered 2020-04-28: 1 g via INTRAVENOUS
  Filled 2020-04-27: qty 10
  Filled 2020-04-27: qty 1

## 2020-04-27 MED ORDER — ACETAMINOPHEN 325 MG PO TABS
ORAL_TABLET | ORAL | Status: AC
  Filled 2020-04-27: qty 2

## 2020-04-27 MED ORDER — VANCOMYCIN HCL IN DEXTROSE 1-5 GM/200ML-% IV SOLN
1000.0000 mg | INTRAVENOUS | Status: DC
  Administered 2020-04-27: 1000 mg via INTRAVENOUS
  Filled 2020-04-27 (×2): qty 200

## 2020-04-27 MED ORDER — ATORVASTATIN CALCIUM 20 MG PO TABS
40.0000 mg | ORAL_TABLET | Freq: Every day | ORAL | Status: DC
  Administered 2020-04-27 – 2020-04-29 (×3): 40 mg via ORAL
  Filled 2020-04-27 (×3): qty 2

## 2020-04-27 MED ORDER — TAMSULOSIN HCL 0.4 MG PO CAPS
0.4000 mg | ORAL_CAPSULE | Freq: Two times a day (BID) | ORAL | Status: DC
  Administered 2020-04-27 – 2020-04-29 (×5): 0.4 mg via ORAL
  Filled 2020-04-27 (×5): qty 1

## 2020-04-27 MED ORDER — ONDANSETRON HCL 4 MG/2ML IJ SOLN
INTRAMUSCULAR | Status: AC
  Administered 2020-04-27: 4 mg via INTRAVENOUS
  Filled 2020-04-27: qty 2

## 2020-04-27 MED ORDER — SODIUM CHLORIDE 0.9 % IV SOLN: INTRAVENOUS | Status: DC

## 2020-04-27 MED ORDER — VANCOMYCIN HCL IN DEXTROSE 1-5 GM/200ML-% IV SOLN
1000.0000 mg | Freq: Once | INTRAVENOUS | Status: AC
  Administered 2020-04-27: 1000 mg via INTRAVENOUS
  Filled 2020-04-27: qty 200

## 2020-04-27 MED ORDER — OXYCODONE HCL 5 MG PO TABS: 5.0000 mg | ORAL_TABLET | ORAL | Status: DC | PRN

## 2020-04-27 MED ORDER — PANTOPRAZOLE SODIUM 40 MG PO TBEC
40.0000 mg | DELAYED_RELEASE_TABLET | Freq: Every day | ORAL | Status: DC
  Administered 2020-04-27 – 2020-04-29 (×3): 40 mg via ORAL
  Filled 2020-04-27 (×3): qty 1

## 2020-04-27 NOTE — H&P (Signed)
[place]   PATIENT NAME: [name]    MR#:  [id number]  DATE OF BIRTH:  September 09, 1955  DATE OF ADMISSION:  04/27/2020  PRIMARY CARE PHYSICIAN: Lianne Bushy, MD   REQUESTING/REFERRING PHYSICIAN: Rudene Re, MD CHIEF COMPLAINT:   Chief Complaint  Patient presents with  . Fever  . Dysuria    HISTORY OF PRESENT ILLNESS:  [name]  is a 64 y.o. Hispanic American male with a known history of hypertension, coronary artery disease, type 2 diabetes mellitus and renal transplant in 2012 at [facility], on CellCept and Prograf, who presented to the emergency room with acute onset of fever and chills as well as dysuria without hematuria or flank pain.  No abdominal pain or nausea or vomiting.  No diarrhea or melena or bright red bleeding per rectum.  No chest pain or palpitations.  Upon presentation to the emergency room, temperature was 99.9 with a heart rate of 106 and later on temperature was up to 101.3.  Labs revealed borderline potassium of 3.5 and mild hyponatremia 133 with hyperglycemia 267 and BUN/creatinine 41/1.66 compared to 22/0.88 on 09/07/2019.  CBC showed leukocytosis of 14.4 with neutrophilia and lactic acid was 1.  Respiratory panel including COVID-19 PCR and influenza antigens came back negative.  UA was strongly positive for UTI.  Blood cultures were sent renal stone CT scan revealed: 1. Circumferential bladder wall thickening and some mild stranding about the transplant ureter, concerning for cystitis and ascending urinary tract infection. Correlate with urinalysis. 2. Borderline prostatomegaly with hazy stranding about the prostate and seminal vesicles, which could a concomitant prostatitis as well. 3. Marked atrophy and cortical thinning of the native kidneys. 4. Chronic deformity in fusion of the L2-L3 vertebral bodies compatible with sequela of discitis/osteomyelitis better seen on the comparison MRI from 2012. Resulting moderate to  severe canal stenosis, similar to comparison MRI. 5. Aortic Atherosclerosis  Patient was given IV Rocephin and vancomycin as well as 2 L bolus of IV lactated Ringer followed by infusion 150 mL/h.  She will be admitted to a medical monitored in progressive unit bed for further evaluation and management PAST MEDICAL HISTORY:   Past Medical History:  Diagnosis Date  . Arthritis   . Chronic kidney disease    KIDNEY TRANSPLANT  . Coronary artery disease   . Diabetes mellitus without complication ([place])   . Dyspnea   . GERD (gastroesophageal reflux disease)   . History of blood transfusion   . Hypertension   . Myocardial infarction [facility])     PAST SURGICAL HISTORY:   Past Surgical History:  Procedure Laterality Date  . CATARACT EXTRACTION W/PHACO Right 02/03/2017   Procedure: CATARACT EXTRACTION PHACO AND INTRAOCULAR LENS PLACEMENT (IOC);  Surgeon: Leandrew Koyanagi, MD;  Location: [facility] ORS;  Service: Ophthalmology;  Laterality: Right;  [place] 00:35.8 AP% 12.8 CDE 4.57 Fluid lot # [id number]  . CORONARY ANGIOPLASTY     STENTS X 4  . CORONARY ARTERY BYPASS GRAFT    . ENDARTERECTOMY Left 09/06/2019   Procedure: ENDARTERECTOMY CAROTID;  Surgeon: Algernon Huxley, MD;  Location: [facility] ORS;  Service: Vascular;  Laterality: Left;  . EYE SURGERY Bilateral    cataract  . HIP SURGERY    . KIDNEY TRANSPLANT     2016    SOCIAL HISTORY:   Social History   Tobacco Use  . Smoking status: Former Research scientist (life sciences)  . Smokeless tobacco: Never Used  . Tobacco comment: 30 years ago  Substance  Use Topics  . Alcohol use: No    Alcohol/week: 0.0 standard drinks    FAMILY HISTORY:   Family History  Problem Relation Age of Onset  . Drug abuse Sister   . Drug abuse Brother     DRUG ALLERGIES:  No Known Allergies  REVIEW OF SYSTEMS:   ROS As per history of present illness. All pertinent systems were reviewed above. Constitutional, HEENT, cardiovascular, respiratory, GI, GU, musculoskeletal, neuro,  psychiatric, endocrine, integumentary and hematologic systems were reviewed and are otherwise negative/unremarkable except for positive findings mentioned above in the HPI.   MEDICATIONS AT HOME:   Prior to Admission medications   Medication Sig Start Date End Date Taking? Authorizing Provider  amLODipine (NORVASC) 10 MG tablet Take 10 mg by mouth daily.  08/06/16   [provider]  aspirin EC 81 MG EC tablet Take 1 tablet (81 mg total) by mouth daily at 6 (six) AM. 09/08/19   Stegmayer, Janalyn Harder, PA-C  atorvastatin (LIPITOR) 40 MG tablet 40 mg daily at 6 PM.  12/23/14   [provider]  carvedilol (COREG) 3.125 MG tablet 2 (two) times daily with a meal.  06/02/16   [provider]  chlorthalidone (HYGROTON) 25 MG tablet Take 25 mg by mouth daily.  08/10/16   [provider]  Cholecalciferol (VITAMIN D3) 50 MCG (2000 UT) capsule Take by mouth. 03/17/16   [provider]  ELIQUIS 5 MG TABS tablet Take 5 mg by mouth 2 (two) times daily.  04/21/18   [provider]  HUMALOG KWIKPEN 100 UNIT/ML KiwkPen Inject 10-12 Units into the skin 3 (three) times daily. Inject 10u daily at breakfast, 10u at lunch and up to 12u at supper per SSI    [provider]  Insulin Pen Needle (ADVOCATE INSULIN PEN NEEDLES) 33G X 4 MM MISC To use with insulin pen 4 times per day. E 13.9 02/19/16   [provider]  LANTUS SOLOSTAR 100 UNIT/ML Solostar Pen Inject 24 Units into the skin at bedtime.  01/18/15   [provider]  linagliptin (TRADJENTA) 5 MG TABS tablet Take by mouth. 11/23/19   [provider]  losartan (COZAAR) 50 MG tablet Take 50 mg by mouth daily.  05/24/18   [provider]  mycophenolate (CELLCEPT) 250 MG capsule Take by mouth. 11/14/18 11/22/20  [provider]  omeprazole (PRILOSEC) 20 MG capsule Take 20 mg by mouth daily as needed.     [provider]  oxyCODONE-acetaminophen (PERCOCET/ROXICET) 5-325  MG tablet Take 1 tablet by mouth every 6 (six) hours as needed for moderate pain. 09/07/19   Stegmayer, Joelene Millin A, PA-C  PROGRAF 1 MG capsule Take 1 mg by mouth 2 (two) times daily.  08/11/16   [provider]  tamsulosin (FLOMAX) 0.4 MG CAPS capsule Take 0.4 mg by mouth 2 (two) times daily.     [provider]      VITAL SIGNS:  Blood pressure (!) 157/89, pulse 94, temperature 98.5 F (36.9 C), temperature source Oral, resp. rate 16, SpO2 96 %.  PHYSICAL EXAMINATION:  Physical Exam  GENERAL:  64 y.o.-year-old Hispanic American male patient lying in the bed shivering with no respiratory distress. EYES: Pupils equal, round, reactive to light and accommodation. No scleral icterus. Extraocular muscles intact.  HEENT: Head atraumatic, normocephalic. Oropharynx and nasopharynx clear.  NECK:  Supple, no jugular venous distention. No thyroid enlargement, no tenderness.  LUNGS: Normal breath sounds bilaterally, no wheezing, rales,rhonchi or crepitation. No use  of accessory muscles of respiration.  CARDIOVASCULAR: Regular rate and rhythm, S1, S2 normal. No murmurs, rubs, or gallops.  ABDOMEN: Soft, nondistended, nontender. Bowel sounds present. No organomegaly or mass.  EXTREMITIES: No pedal edema, cyanosis, or clubbing.  NEUROLOGIC: Cranial nerves II through XII are intact. Muscle strength 5/5 in all extremities. Sensation intact. Gait not checked.  PSYCHIATRIC: The patient is alert and oriented x 3.  Normal affect and good eye contact. SKIN: No obvious rash, lesion, or ulcer.   LABORATORY PANEL:   CBC Recent Labs  Lab 04/26/20 2156  WBC 14.4*  HGB 14.6  HCT 41.7  PLT 157   ------------------------------------------------------------------------------------------------------------------  Chemistries  Recent Labs  Lab 04/26/20 2156  NA 133*  K 3.5  CL 101  CO2 20*  GLUCOSE 267*  BUN 41*  CREATININE 1.66*  CALCIUM 8.9  AST 25  ALT 20  ALKPHOS 43  BILITOT 1.2    ------------------------------------------------------------------------------------------------------------------  Cardiac Enzymes No results for input(s): TROPONINI in the last 168 hours. ------------------------------------------------------------------------------------------------------------------  RADIOLOGY:  CT Renal Stone Study  Result Date: 04/27/2020 CLINICAL DATA:  Flank pain, stone disease suspected, urinary hesitancy, chills, history of renal transplant EXAM: CT ABDOMEN AND PELVIS WITHOUT CONTRAST TECHNIQUE: Multidetector CT imaging of the abdomen and pelvis was performed following the standard protocol without IV contrast. COMPARISON:  CT 04/12/2010, pelvic ultrasound 05/11/2011 lumbar MR 05/25/2011 FINDINGS: Lower chest: Evidence of prior sternotomy. Cardiac size is top normal with calcification of the native coronary arteries. No pericardial effusion. Some bandlike opacities in the lung bases likely reflect areas of atelectasis or scarring. Some mild dependent atelectasis as well. Hepatobiliary: No visible liver lesions on this unenhanced CT. Smooth liver surface contour. Normal liver attenuation. Normal gallbladder and biliary tree without visible calcified gallstone, pericholecystic inflammation or ductal dilatation. Pancreas: Unremarkable. No pancreatic ductal dilatation or surrounding inflammatory changes. Spleen: Normal in size. No concerning splenic lesions. Adrenals/Urinary Tract: Normal adrenal glands. Marked atrophy and cortical thinning of the native kidneys. No concerning masses of the native kidneys. Thigh no urolithiasis or hydronephrosis. Nonspecific symmetric bilateral perinephric stranding of the native kidneys. A right lower quadrant transplant kidney is noted. No concerning mass, urolithiasis or hydronephrosis of the transplant kidney. There is however circumferential bladder wall thickening and some mild stranding about the transplant ureter as well. No bladder  calculi or debris. Indentation of the bladder base by the enlarged prostate. Stomach/Bowel: Distal esophagus, stomach and duodenal sweep are unremarkable. No small bowel wall thickening or dilatation. No evidence of obstruction. A normal appendix is visualized. No colonic dilatation or wall thickening accounting for underdistention. Vascular/Lymphatic: Atherosclerotic calcifications within the abdominal aorta and branch vessels. No aneurysm or ectasia. No enlarged abdominopelvic lymph nodes. Reproductive: Borderline prostatomegaly. Mild hazy stranding about the prostate and seminal vesicles. Other: No abdominopelvic free air or fluid. No bowel containing hernias. Musculoskeletal: Prior left femoral intramedullary nail and transcervical pin without evidence of acute hardware complication. Minimal heterotopic calcification adjacent the left greater trochanter. Chronic deformity of the L2/L3 vertebral bodies likely reflecting sequela of discitis/osteomyelitis better seen on the comparison MRI from 2012. Resulting moderate to severe canal stenosis at this level. No acute osseous abnormality or suspicious osseous lesion. IMPRESSION: 1. Circumferential bladder wall thickening and some mild stranding about the transplant ureter, concerning for cystitis and ascending urinary tract infection. Correlate with urinalysis. 2. Borderline prostatomegaly with hazy stranding about the prostate and seminal vesicles, which could a concomitant prostatitis as well. 3. Marked atrophy and cortical thinning of the native kidneys.  4. Chronic deformity in fusion of the L2-L3 vertebral bodies compatible with sequela of discitis/osteomyelitis better seen on the comparison MRI from 2012. Resulting moderate to severe canal stenosis, similar to comparison MRI. 5. Aortic Atherosclerosis (ICD10-I70.0). Electronically Signed   By: Lovena Le M.D.   On: 04/27/2020 02:53      IMPRESSION AND PLAN:   1.  Sepsis secondary to UTI.  No current  severe sepsis or septic shock.  Sepsis manifested by leukocytosis, fever and tachycardia. -The patient will be admitted to progressive unit bed. -We will continue back therapy with IV Rocephin and given immunosuppressive status we will add IV vancomycin pending urine and blood cultures. -Urology consult will be obtained. -I notified Dr. Louis Meckel about the patient. -We will continue hydration with IV normal saline.  2.  Essential hypertension. -Continue amlodipine and Coreg and hold off Cozaar..  3.  Dyslipidemia. -We will continue statin therapy.  4.  Type 2 diabetes mellitus. -Continue basal coverage and place him on supplement coverage with NovoLog.  5.  BPH. -We will continue Flomax.  6.  Status post renal transplant. -We will continue Prograf and CellCept for now.  7.  DVT prophylaxis. -Subcutaneous Lovenox.   All the records are reviewed and case discussed with ED provider. The plan of care was discussed in details with the patient (and family). I answered all questions. The patient agreed to proceed with the above mentioned plan. Further management will depend upon hospital course.   CODE STATUS: Full code  Status is: Inpatient  Remains inpatient appropriate because:Hemodynamically unstable, Ongoing diagnostic testing needed not appropriate for outpatient work up, Unsafe d/c plan, IV treatments appropriate due to intensity of illness or inability to take PO and Inpatient level of care appropriate due to severity of illness   Dispo: The patient is from: Home              Anticipated d/c is to: Home              Anticipated d/c date is: 3 days              Patient currently is not medically stable to d/c.   TOTAL TIME TAKING CARE OF THIS PATIENT: 55 minutes.    Christel Mormon M.D on 04/27/2020 at 4:16 AM  [facility]   From 7 PM-7 AM, contact night-coverage [email or web address]  CC: Primary care physician; Lianne Bushy, MD

## 2020-04-27 NOTE — ED Notes (Signed)
Pt resting.

## 2020-04-27 NOTE — ED Notes (Signed)
Attempt to call report to floor, rn not available.

## 2020-04-27 NOTE — ED Notes (Signed)
Pt up out of bed, has removed condom cath and urinated in water cup at bedside. Pt assisted back to bed, gown changed. Pt states he accidentally pulled cath off and did not want to urinate in bed. Pt with sweating noted, states he feels like his fever is coming down. Blood sugar checked due to sweating. Call bell at left side. New condom cath placed with foreskin in down position. Temp rechecked. Cannot administer lovenox at this time due to not verified by pharmacy.

## 2020-04-27 NOTE — ED Notes (Signed)
Lab here for venipuncture for am labs.

## 2020-04-27 NOTE — Consult Note (Signed)
I have been asked to see the patient by Dr. Eugenie Norrie, for evaluation and management of urinary tract infection.  History of present illness: 64 year old male with a history of a renal transplant in 2016 who had been doing quite well, and was very compliant with his immunosuppression regiment, who presented to the hospital with dysuria and fever since 1 AM.  He is having increased urinary frequency and urgency.  He denies any abdominal pain.  He follows for his renal transplant at [facility].  He is seen every 3 months and is scheduled to be seen on Tuesday.  In the emergency department the patient was noted to be febrile to 101.3.  His white blood cell count was slightly elevated and his creatinine had worsened since last check.  His urine output had been adequate.  He has had some tachycardia as well.  A CT scan was performed which demonstrated no obstruction or real significant GU abnormality aside from his atrophic native kidneys.  He was started on vancomycin and ceftriaxone.  At the time of my consultation the patient was only complaining of dysuria and urinary frequency.  He denies incomplete bladder emptying.  He has a good force of stream.  Denies any constipation.  He has no history of recurrent urinary tract infections.  No history of kidney stones.  The patient has a past medical history of hypertension and diabetes.  Review of systems: A 12 point comprehensive review of systems was obtained and is negative unless otherwise stated in the history of present illness.  Patient Active Problem List   Diagnosis Date Noted  . Sepsis due to gram-negative UTI ([place]) 04/27/2020  . Carotid stenosis, left 09/06/2019  . Carotid artery stenosis 08/30/2019  . Coronary artery disease 06/13/2018  . Hypercholesteremia 06/13/2018  . Pulmonary scarring 06/13/2018  . Vitamin D deficiency 06/13/2018  . Chest pain 09/20/2017  . Complication of arteriovenous dialysis fistula 08/23/2017  . Aftercare  following organ transplant 02/16/2017  . Kidney transplant recipient 08/20/2016  . Diabetes ([place]) 08/20/2016  . Essential hypertension 08/20/2016  . Immunosuppression ([place]) 11/11/2015  . Paroxysmal atrial fibrillation ([place]) 07/22/2015    No current facility-administered medications on file prior to encounter.   Current Outpatient Medications on File Prior to Encounter  Medication Sig Dispense Refill  . amLODipine (NORVASC) 10 MG tablet Take 10 mg by mouth daily.     [name] aspirin EC 81 MG EC tablet Take 1 tablet (81 mg total) by mouth daily at 6 (six) AM.    . atorvastatin (LIPITOR) 40 MG tablet 40 mg daily at 6 PM.     . carvedilol (COREG) 3.125 MG tablet 2 (two) times daily with a meal.     . chlorthalidone (HYGROTON) 25 MG tablet Take 25 mg by mouth daily.     . Cholecalciferol (VITAMIN D3) 50 MCG (2000 UT) capsule Take 2,000 Units by mouth daily.     [name] ELIQUIS 5 MG TABS tablet Take 5 mg by mouth 2 (two) times daily.     [name] HUMALOG KWIKPEN 100 UNIT/ML KiwkPen Inject 10-12 Units into the skin 3 (three) times daily. Inject 10u daily at breakfast, 10u at lunch and up to 12u at supper per SSI    . Insulin Pen Needle (ADVOCATE INSULIN PEN NEEDLES) 33G X 4 MM MISC To use with insulin pen 4 times per day. E 13.9    . LANTUS SOLOSTAR 100 UNIT/ML Solostar Pen Inject 24 Units into the skin at bedtime.     [name]  linagliptin (TRADJENTA) 5 MG TABS tablet Take 5 mg by mouth daily.     [name] losartan (COZAAR) 50 MG tablet Take 50 mg by mouth daily.     . mycophenolate (CELLCEPT) 250 MG capsule Take 750 mg by mouth 2 (two) times daily.     [name] omeprazole (PRILOSEC) 20 MG capsule Take 20 mg by mouth daily as needed.     [name] PROGRAF 1 MG capsule Take 1 mg by mouth 2 (two) times daily.     . tamsulosin (FLOMAX) 0.4 MG CAPS capsule Take 0.4 mg by mouth 2 (two) times daily.     [name] oxyCODONE-acetaminophen (PERCOCET/ROXICET) 5-325 MG tablet Take 1 tablet by mouth every 6 (six) hours as needed for moderate pain. (Patient not  taking: Reported on 04/27/2020) 28 tablet 0    Past Medical History:  Diagnosis Date  . Arthritis   . Chronic kidney disease    KIDNEY TRANSPLANT  . Coronary artery disease   . Diabetes mellitus without complication ([place])   . Dyspnea   . GERD (gastroesophageal reflux disease)   . History of blood transfusion   . Hypertension   . Myocardial infarction [facility])     Past Surgical History:  Procedure Laterality Date  . CATARACT EXTRACTION W/PHACO Right 02/03/2017   Procedure: CATARACT EXTRACTION PHACO AND INTRAOCULAR LENS PLACEMENT (IOC);  Surgeon: Leandrew Koyanagi, MD;  Location: [facility] ORS;  Service: Ophthalmology;  Laterality: Right;  [place] 00:35.8 AP% 12.8 CDE 4.57 Fluid lot # [id number]  . CORONARY ANGIOPLASTY     STENTS X 4  . CORONARY ARTERY BYPASS GRAFT    . ENDARTERECTOMY Left 09/06/2019   Procedure: ENDARTERECTOMY CAROTID;  Surgeon: Algernon Huxley, MD;  Location: [facility] ORS;  Service: Vascular;  Laterality: Left;  . EYE SURGERY Bilateral    cataract  . HIP SURGERY    . KIDNEY TRANSPLANT     2016    Social History   Tobacco Use  . Smoking status: Former Research scientist (life sciences)  . Smokeless tobacco: Never Used  . Tobacco comment: 30 years ago  Vaping Use  . Vaping Use: Never used  Substance Use Topics  . Alcohol use: No    Alcohol/week: 0.0 standard drinks  . Drug use: Never    Family History  Problem Relation Age of Onset  . Drug abuse Sister   . Drug abuse Brother     PE: Vitals:   04/27/20 0636 04/27/20 0638 04/27/20 0650 04/27/20 0700  BP:  (!) 162/64  (!) 160/65  Pulse:  (!) 104    Resp:  20    Temp: (!) 100.6 F (38.1 C)     TempSrc: Oral     SpO2:      Weight:   72.6 kg    Patient appears to be in no acute distress  patient is alert and oriented x3 Atraumatic normocephalic head No cervical or supraclavicular lymphadenopathy appreciated No increased work of breathing, no audible wheezes/rhonchi Regular sinus rhythm/rate Abdomen is soft, nontender, nondistended, no  CVA or suprapubic tenderness Lower extremities are symmetric without appreciable edema Grossly neurologically intact No identifiable skin lesions  Recent Labs    04/26/20 2156 04/27/20 0652  WBC 14.4* 12.5*  HGB 14.6 12.8*  HCT 41.7 36.8*   Recent Labs    04/26/20 2156 04/27/20 0652  NA 133* 132*  K 3.5 3.2*  CL 101 101  CO2 20* 20*  GLUCOSE 267* 213*  BUN 41* 36*  CREATININE 1.66* 1.41*  CALCIUM 8.9 8.0*  Recent Labs    04/27/20 0652  INR 1.5*   No results for input(s): LABURIN in the last 72 hours. Results for orders placed or performed during the hospital encounter of 04/27/20  Culture, blood (routine x 2)     Status: None (Preliminary result)   Collection Time: 04/27/20  2:12 AM   Specimen: BLOOD  Result Value Ref Range Status   Specimen Description BLOOD BLOOD RIGHT HAND  Final   Special Requests   Final    BOTTLES DRAWN AEROBIC AND ANAEROBIC Blood Culture adequate volume   Culture   Final    NO GROWTH < 12 HOURS Performed at [facility], [address] [place], [place] [address]    Report Status PENDING  Incomplete  Culture, blood (routine x 2)     Status: None (Preliminary result)   Collection Time: 04/27/20  2:29 AM   Specimen: BLOOD  Result Value Ref Range Status   Specimen Description BLOOD RT HAND  Final   Special Requests   Final    BOTTLES DRAWN AEROBIC AND ANAEROBIC Blood Culture adequate volume   Culture   Final    NO GROWTH < 12 HOURS Performed at [facility], [address] [place], [place] [address]    Report Status PENDING  Incomplete  Respiratory Panel by RT PCR (Flu A&B, Covid) - Nasopharyngeal Swab     Status: None   Collection Time: 04/27/20  3:49 AM   Specimen: Nasopharyngeal Swab  Result Value Ref Range Status   SARS Coronavirus 2 by RT PCR NEGATIVE NEGATIVE Final    Comment: (NOTE) SARS-CoV-2 target nucleic acids are NOT DETECTED.  The SARS-CoV-2 RNA is generally detectable in upper  respiratoy specimens during the acute phase of infection. The lowest concentration of SARS-CoV-2 viral copies this assay can detect is 131 copies/mL. A negative result does not preclude SARS-Cov-2 infection and should not be used as the sole basis for treatment or other patient management decisions. A negative result may occur with  improper specimen collection/handling, submission of specimen other than nasopharyngeal swab, presence of viral mutation(s) within the areas targeted by this assay, and inadequate number of viral copies (<131 copies/mL). A negative result must be combined with clinical observations, patient history, and epidemiological information. The expected result is Negative.  Fact Sheet for Patients:  PinkCheek.be  Fact Sheet for Healthcare Providers:  GravelBags.it  This test is no t yet approved or cleared by the [place] FDA and  has been authorized for detection and/or diagnosis of SARS-CoV-2 by FDA under an Emergency Use Authorization (EUA). This EUA will remain  in effect (meaning this test can be used) for the duration of the COVID-19 declaration under Section 564(b)(1) of the Act, 21 U.S.C. section 360bbb-3(b)(1), unless the authorization is terminated or revoked sooner.     Influenza A by PCR NEGATIVE NEGATIVE Final   Influenza B by PCR NEGATIVE NEGATIVE Final    Comment: (NOTE) The Xpert Xpress SARS-CoV-2/FLU/RSV assay is intended as an aid in  the diagnosis of influenza from Nasopharyngeal swab specimens and  should not be used as a sole basis for treatment. Nasal washings and  aspirates are unacceptable for Xpert Xpress SARS-CoV-2/FLU/RSV  testing.  Fact Sheet for Patients: PinkCheek.be  Fact Sheet for Healthcare Providers: GravelBags.it  This test is not yet approved or cleared by the [place] FDA and  has been  authorized for detection and/or diagnosis of SARS-CoV-2 by  FDA under an Emergency Use Authorization (EUA). This  EUA will remain  in effect (meaning this test can be used) for the duration of the  Covid-19 declaration under Section 564(b)(1) of the Act, 21  U.S.C. section 360bbb-3(b)(1), unless the authorization is  terminated or revoked. Performed at [facility], [address] [place], [place] [address]     Imaging: I reviewed the patient's CT scan as noted in the HPI.  Imp: The patient has febrile UTI, likely pyelonephritis in his transplanted kidney, on immunosuppression.  Recommendations: Would recommend treating the patient with broad-spectrum antibiotics and narrowing of them once the cultures return.  He will need antibiotics for 14 days.  He should follow-up with his transplant team as scheduled on Tuesday.   Thank you for this interesting consult, we will sign off.   Ardis Hughs

## 2020-04-27 NOTE — ED Notes (Signed)
Dr Kary Kos at bedside

## 2020-04-27 NOTE — ED Provider Notes (Signed)
[facility] Emergency Department Provider Note  ____________________________________________  Time seen: Approximately 3:40 AM  I have reviewed the triage vital signs and the nursing notes.   HISTORY  Chief Complaint Fever and Dysuria   HPI DARIC [name] is a 64 y.o. [name] with a history of kidney transplant in 2016 on CellCept and Prograf, diabetes, CAD, hypertension, paroxysmal atrial fibrillation on Eliquis who presents for evaluation of dysuria.  Symptoms started this afternoon.  Patient is complaining of urinary frequency and dysuria.  Had a fever of 101F at home.  No abdominal pain, no back pain, no nausea, no vomiting, no cough, no chest pain or shortness of breath.   Past Medical History:  Diagnosis Date  . Arthritis   . Chronic kidney disease    KIDNEY TRANSPLANT  . Coronary artery disease   . Diabetes mellitus without complication ([place])   . Dyspnea   . GERD (gastroesophageal reflux disease)   . History of blood transfusion   . Hypertension   . Myocardial infarction [facility])     Patient Active Problem List   Diagnosis Date Noted  . Carotid stenosis, left 09/06/2019  . Carotid artery stenosis 08/30/2019  . Coronary artery disease 06/13/2018  . Hypercholesteremia 06/13/2018  . Pulmonary scarring 06/13/2018  . Vitamin D deficiency 06/13/2018  . Chest pain 09/20/2017  . Complication of arteriovenous dialysis fistula 08/23/2017  . Aftercare following organ transplant 02/16/2017  . Kidney transplant recipient 08/20/2016  . Diabetes ([place]) 08/20/2016  . Essential hypertension 08/20/2016  . Immunosuppression ([place]) 11/11/2015  . Paroxysmal atrial fibrillation ([place]) 07/22/2015    Past Surgical History:  Procedure Laterality Date  . CATARACT EXTRACTION W/PHACO Right 02/03/2017   Procedure: CATARACT EXTRACTION PHACO AND INTRAOCULAR LENS PLACEMENT (IOC);  Surgeon: Leandrew Koyanagi, MD;  Location: [facility] ORS;  Service: Ophthalmology;   Laterality: Right;  [place] 00:35.8 AP% 12.8 CDE 4.57 Fluid lot # [id number]  . CORONARY ANGIOPLASTY     STENTS X 4  . CORONARY ARTERY BYPASS GRAFT    . ENDARTERECTOMY Left 09/06/2019   Procedure: ENDARTERECTOMY CAROTID;  Surgeon: Algernon Huxley, MD;  Location: [facility] ORS;  Service: Vascular;  Laterality: Left;  . EYE SURGERY Bilateral    cataract  . HIP SURGERY    . KIDNEY TRANSPLANT     2016    Prior to Admission medications   Medication Sig Start Date End Date Taking? Authorizing Provider  amLODipine (NORVASC) 10 MG tablet Take 10 mg by mouth daily.  08/06/16   [provider]  aspirin EC 81 MG EC tablet Take 1 tablet (81 mg total) by mouth daily at 6 (six) AM. 09/08/19   Stegmayer, Janalyn Harder, PA-C  atorvastatin (LIPITOR) 40 MG tablet 40 mg daily at 6 PM.  12/23/14   [provider]  carvedilol (COREG) 3.125 MG tablet 2 (two) times daily with a meal.  06/02/16   [provider]  chlorthalidone (HYGROTON) 25 MG tablet Take 25 mg by mouth daily.  08/10/16   [provider]  Cholecalciferol (VITAMIN D3) Leonard MCG (2000 UT) capsule Take by mouth. 03/17/16   [provider]  ELIQUIS 5 MG TABS tablet Take 5 mg by mouth 2 (two) times daily.  04/21/18   [provider]  HUMALOG KWIKPEN 100 UNIT/ML KiwkPen Inject 10-12 Units into the skin 3 (three) times daily. Inject 10u daily at breakfast, 10u at lunch and up to 12u at supper per SSI    [provider]  Insulin  Pen Needle (ADVOCATE INSULIN PEN NEEDLES) 33G X 4 MM MISC To use with insulin pen 4 times per day. E 13.9 02/19/16   [provider]  LANTUS SOLOSTAR 100 UNIT/ML Solostar Pen Inject 24 Units into the skin at bedtime.  01/18/15   [provider]  linagliptin (TRADJENTA) 5 MG TABS tablet Take by mouth. 11/23/19   [provider]  losartan (COZAAR) Leonard MG tablet Take Leonard mg by mouth daily.  05/24/18   [provider]  mycophenolate (CELLCEPT) 250 MG capsule Take by  mouth. 11/14/18 11/22/20  [provider]  omeprazole (PRILOSEC) 20 MG capsule Take 20 mg by mouth daily as needed.     [provider]  oxyCODONE-acetaminophen (PERCOCET/ROXICET) 5-325 MG tablet Take 1 tablet by mouth every 6 (six) hours as needed for moderate pain. 09/07/19   Stegmayer, Joelene Millin A, PA-C  PROGRAF 1 MG capsule Take 1 mg by mouth 2 (two) times daily.  08/11/16   [provider]  tamsulosin (FLOMAX) 0.4 MG CAPS capsule Take 0.4 mg by mouth 2 (two) times daily.     [provider]    Allergies Patient has no known allergies.  Family History  Problem Relation Age of Onset  . Drug abuse Sister   . Drug abuse Brother     Social History Social History   Tobacco Use  . Smoking status: Former Research scientist (life sciences)  . Smokeless tobacco: Never Used  . Tobacco comment: 30 years ago  Vaping Use  . Vaping Use: Never used  Substance Use Topics  . Alcohol use: No    Alcohol/week: 0.0 standard drinks  . Drug use: Never    Review of Systems  Constitutional: + fever. Eyes: Negative for visual changes. ENT: Negative for sore throat. Neck: No neck pain  Cardiovascular: Negative for chest pain. Respiratory: Negative for shortness of breath. Gastrointestinal: Negative for abdominal pain, vomiting or diarrhea. Genitourinary: + dysuria, hematuria Musculoskeletal: Negative for back pain. Skin: Negative for rash. Neurological: Negative for headaches, weakness or numbness. Psych: No SI or HI  ____________________________________________   PHYSICAL EXAM:  VITAL SIGNS: ED Triage Vitals [04/26/20 2154]  Enc Vitals Group     BP 121/74     Pulse Rate (!) 106     Resp 14     Temp 99.9 F (37.7 C)     Temp Source Oral     SpO2 96 %     Weight      Height      Head Circumference      Peak Flow      Pain Score 0     Pain Loc      Pain Edu?      Excl. in [place]?     Constitutional: Alert and oriented. Well appearing and in no apparent distress. HEENT:       Head: Normocephalic and atraumatic.         Eyes: Conjunctivae are normal. Sclera is non-icteric.       Mouth/Throat: Mucous membranes are moist.       Neck: Supple with no signs of meningismus. Cardiovascular: Regular rate and rhythm. No murmurs, gallops, or rubs.  Respiratory: Normal respiratory effort. Lungs are clear to auscultation bilaterally.  Gastrointestinal: Soft, non tender, and non distended with positive bowel sounds. No rebound or guarding. Genitourinary: No CVA tenderness. Musculoskeletal:  No edema, cyanosis, or erythema of extremities. Neurologic: Normal speech and language. Face is symmetric. Moving all extremities. No gross focal neurologic deficits are appreciated.  Skin: Skin is warm, dry and intact. No rash noted. Psychiatric: Mood and affect are normal. Speech and behavior are normal.  ____________________________________________   LABS (all labs ordered are listed, but only abnormal results are displayed)  Labs Reviewed  COMPREHENSIVE METABOLIC PANEL - Abnormal; Notable for the following components:      Result Value   Sodium 133 (*)    CO2 20 (*)    Glucose, Bld 267 (*)    BUN 41 (*)    Creatinine, Ser 1.66 (*)    GFR calc non Af Amer 43 (*)    GFR calc Af Amer 49 (*)    All other components within normal limits  CBC WITH DIFFERENTIAL/PLATELET - Abnormal; Notable for the following components:   WBC 14.4 (*)    Neutro Abs 12.8 (*)    All other components within normal limits  URINALYSIS, COMPLETE (UACMP) WITH MICROSCOPIC - Abnormal; Notable for the following components:   Color, Urine AMBER (*)    APPearance CLOUDY (*)    Hgb urine dipstick LARGE (*)    Protein, ur >=300 (*)    Leukocytes,Ua LARGE (*)    RBC / HPF >Leonard (*)    WBC, UA >Leonard (*)    Bacteria, UA MANY (*)    All other components within normal limits  CULTURE, BLOOD (ROUTINE X 2)  CULTURE, BLOOD (ROUTINE X 2)  RESPIRATORY PANEL BY RT PCR (FLU A&B, COVID)  LACTIC ACID, PLASMA  LACTIC  ACID, PLASMA  PROCALCITONIN   ____________________________________________  EKG  none  ____________________________________________  RADIOLOGY  I have personally reviewed the images performed during this visit and I agree with the Radiologist's read.   Interpretation by Radiologist:  CT Renal Stone Study  Result Date: 04/27/2020 CLINICAL DATA:  Flank pain, stone disease suspected, urinary hesitancy, chills, history of renal transplant EXAM: CT ABDOMEN AND PELVIS WITHOUT CONTRAST TECHNIQUE: Multidetector CT imaging of the abdomen and pelvis was performed following the standard protocol without IV contrast. COMPARISON:  CT 04/12/2010, pelvic ultrasound 05/11/2011 lumbar MR 05/25/2011 FINDINGS: Lower chest: Evidence of prior sternotomy. Cardiac size is top normal with calcification of the native coronary arteries. No pericardial effusion. Some bandlike opacities in the lung bases likely reflect areas of atelectasis or scarring. Some mild dependent atelectasis as well. Hepatobiliary: No visible liver lesions on this unenhanced CT. Smooth liver surface contour. Normal liver attenuation. Normal gallbladder and biliary tree without visible calcified gallstone, pericholecystic inflammation or ductal dilatation. Pancreas: Unremarkable. No pancreatic ductal dilatation or surrounding inflammatory changes. Spleen: Normal in size. No concerning splenic lesions. Adrenals/Urinary Tract: Normal adrenal glands. Marked atrophy and cortical thinning of the native kidneys. No concerning masses of the native kidneys. Thigh no urolithiasis or hydronephrosis. Nonspecific symmetric bilateral perinephric stranding of the native kidneys. A right lower quadrant transplant kidney is noted. No concerning mass, urolithiasis or hydronephrosis of the transplant kidney. There is however circumferential bladder wall thickening and some mild stranding about the transplant ureter as well. No bladder calculi or debris. Indentation of  the bladder base by the enlarged prostate. Stomach/Bowel: Distal esophagus, stomach and duodenal sweep are unremarkable. No small bowel wall thickening or dilatation. No evidence of obstruction. A normal appendix is visualized. No colonic dilatation or wall thickening accounting for underdistention. Vascular/Lymphatic: Atherosclerotic calcifications within the abdominal aorta and branch vessels. No aneurysm or ectasia. No enlarged abdominopelvic lymph nodes. Reproductive: Borderline prostatomegaly. Mild hazy stranding about the prostate and seminal vesicles. Other: No abdominopelvic free air or fluid. No bowel  containing hernias. Musculoskeletal: Prior left femoral intramedullary nail and transcervical pin without evidence of acute hardware complication. Minimal heterotopic calcification adjacent the left greater trochanter. Chronic deformity of the L2/L3 vertebral bodies likely reflecting sequela of discitis/osteomyelitis better seen on the comparison MRI from 2012. Resulting moderate to severe canal stenosis at this level. No acute osseous abnormality or suspicious osseous lesion. IMPRESSION: 1. Circumferential bladder wall thickening and some mild stranding about the transplant ureter, concerning for cystitis and ascending urinary tract infection. Correlate with urinalysis. 2. Borderline prostatomegaly with hazy stranding about the prostate and seminal vesicles, which could a concomitant prostatitis as well. 3. Marked atrophy and cortical thinning of the native kidneys. 4. Chronic deformity in fusion of the L2-L3 vertebral bodies compatible with sequela of discitis/osteomyelitis better seen on the comparison MRI from 2012. Resulting moderate to severe canal stenosis, similar to comparison MRI. 5. Aortic Atherosclerosis (ICD10-I70.0). Electronically Signed   By: Lovena Le M.D.   On: 04/27/2020 02:53     ____________________________________________   PROCEDURES  Procedure(s) performed:yes .1-3 Lead EKG  Interpretation Performed by: Rudene Re, MD Authorized by: Rudene Re, MD     Interpretation: non-specific     ECG rate assessment: tachycardic     Rhythm: sinus tachycardia     Ectopy: none     Critical Care performed: yes  CRITICAL CARE Performed by: Rudene Re  ?  Total critical care time: 35 min  Critical care time was exclusive of separately billable procedures and treating other patients.  Critical care was necessary to treat or prevent imminent or life-threatening deterioration.  Critical care was time spent personally by me on the following activities: development of treatment plan with patient and/or surrogate as well as nursing, discussions with consultants, evaluation of patient's response to treatment, examination of patient, obtaining history from patient or surrogate, ordering and performing treatments and interventions, ordering and review of laboratory studies, ordering and review of radiographic studies, pulse oximetry and re-evaluation of patient's condition.  ____________________________________________   INITIAL IMPRESSION / ASSESSMENT AND PLAN / ED COURSE  64 y.o. [name] with a history of kidney transplant in 2016 on CellCept and Prograf, diabetes, CAD, hypertension, paroxysmal atrial fibrillation on Eliquis who presents for evaluation of dysuria.  Patient with a fever at home, here 99.62F, tachycardic with pulse of 106, leukocytosis with white count of 14.4 and a left shift, also mildly worsening creatinine.  Normal lactic acid.  UA positive for urinary tract infection.  CT showing fat stranding of the transplanted ureter and kidney concerning for pyelonephritis.  At this time patient meets sepsis criteria especially since he is immune suppressed.  Will cover with IV Rocephin and Vanco, IV fluids.  Cultures are pending.  Will discuss with the hospitalist for admission.  Old medical records reviewed.  Patient placed on telemetry for close  monitoring.      _____________________________________________ Please note:  Patient was evaluated in Emergency Department today for the symptoms described in the history of present illness. Patient was evaluated in the context of the global COVID-19 pandemic, which necessitated consideration that the patient might be at risk for infection with the SARS-CoV-2 virus that causes COVID-19. Institutional protocols and algorithms that pertain to the evaluation of patients at risk for COVID-19 are in a state of rapid change based on information released by regulatory bodies including the CDC and federal and state organizations. These policies and algorithms were followed during the patient's care in the ED.  Some ED evaluations and interventions may be delayed  as a result of limited staffing during the pandemic.   [place] Controlled Substance Database was reviewed by me. ____________________________________________   FINAL CLINICAL IMPRESSION(S) / ED DIAGNOSES   Final diagnoses:  Pyelonephritis of transplanted kidney  Sepsis without acute organ dysfunction, due to unspecified organism [facility])      NEW MEDICATIONS STARTED DURING THIS VISIT:  ED Discharge Orders    None       Note:  This document was prepared using Dragon voice recognition software and may include unintentional dictation errors.    Alfred Levins, [place], MD 04/27/20 [phone]

## 2020-04-27 NOTE — ED Notes (Signed)
Lunch tray provided. 

## 2020-04-27 NOTE — ED Notes (Signed)
Lab called to notify of need for venipuncture assist with am labs. Spoke with gwen.

## 2020-04-27 NOTE — ED Notes (Signed)
Pt changed into hospital bed for comfort, po fluids, kleenex, call bell at side. Pt states he is afraid he is going to soil bed with urine while he is sleeping. Pt offered a condom cath and pt consents with application.

## 2020-04-27 NOTE — Consult Note (Signed)
Pharmacy Antibiotic Note  [name] is a 64 y.o. male admitted on 04/27/2020 with h/o hypertension, coronary artery disease, type 2 diabetes mellitus and renal transplant in 2012 at [facility], on CellCept and Prograf, who presented to the emergency room with acute onset of fever and chills as well as dysuria without hematuria or flank pain.Pharmacy has been consulted for vancomycin dosing. CT shows possible cystitis and prostatitis He received a dose of 1000 mg in the ED. Renal function is lower than previous baseline suggests  Plan:    vancomycin 1000 mg IV Q 24 hrs  Ke: 0.040 h-1, T1/2: 17.3 h  Css (calculated): 31.0/12.3 mcg/mL  Vancomycin levels as clinically indicated  Daily SCr to assess renal function while on vancomycin   Weight: 72.6 kg (160 lb)  Temp (24hrs), Avg:100.1 F (37.8 C), Min:98.5 F (36.9 C), Max:101.3 F (38.5 C)  Recent Labs  Lab 04/26/20 2156 04/27/20 0212  WBC 14.4*  --   CREATININE 1.66*  --   LATICACIDVEN  --  1.0    Estimated Creatinine Clearance: 42.9 mL/min (A) (by C-G formula based on SCr of 1.66 mg/dL (H)).    No Known Allergies  Antimicrobials this admission: cefriaxone 9/26 >>  vancomycin 9/26 >>   Microbiology results: 9/26 BCx: NG < 12 h 9/26 UCx: pending  9/26 influenza A/B: negative 9/26 SARS CoV-2: negative   Thank you for allowing pharmacy to be a part of this patient's care.  Dallie Piles 04/27/2020 7:02 AM

## 2020-04-27 NOTE — Progress Notes (Signed)
CODE SEPSIS - PHARMACY COMMUNICATION  **Broad Spectrum Antibiotics should be administered within 1 hour of Sepsis diagnosis**  Time Code Sepsis Called/Page Received: 9/26 @ 0340  Antibiotics Ordered: Ceftriaxone   Time of 1st antibiotic administration: 09/26 @ 0347   Additional action taken by pharmacy:   If necessary, Name of Provider/Nurse Contacted:     Cephus Tupy D ,PharmD Clinical Pharmacist  04/27/2020  3:50 AM

## 2020-04-27 NOTE — ED Notes (Signed)
Condom cath removed. Pt able to void without difficulty.

## 2020-04-27 NOTE — ED Notes (Signed)
Report to brandy, rn.  

## 2020-04-27 NOTE — Progress Notes (Signed)
  PROGRESS NOTE    [name]  UVO:[id number] DOB: 19-Aug-1955 DOA: 04/27/2020  PCP: Lianne Bushy, MD    LOS - 0    Patient admitted earlier this AM with sepsis secondary to UTI and possible pyelonephritis.  Interval subjective: pt seen in ED on hold for a bed this AM.  Reports feeling a little better today.    Exam: diaphoretic, mildly ill-appearing, nontender abomen and suprapubic, lungs clear b/l, heart RRR, no peripheral edema   Active Problems:   Sepsis due to gram-negative UTI ([place])    I have reviewed the full H&P by Dr. Sidney Ace in detail, and I agree with the assessment and plan as outlined therein. --In addition, pt with hx of A-fib on Elquis.  D/C Lovenox and resume Eliquis   No Charge    Ezekiel Slocumb, DO [facility]   If 7PM-7AM, please contact night-coverage [email or web address] 04/27/2020, 3:46 PM

## 2020-04-27 NOTE — ED Notes (Signed)
Pt assisted to reposition in the bed and oriented to the use of the buttons for position change on the hospital bed, pt's foley tubing also repositioned and a cup of water given to the patient. Pt's air turned down due to feeling warm I the room upon pt's request

## 2020-04-27 NOTE — ED Notes (Signed)
Dr. Sidney Ace notified of temp. No new orders received. Pt with wretching post tylenol, no emesis. zofran given.

## 2020-04-27 NOTE — Hospital Course (Signed)
[name]  is a 64 y.o. Hispanic American male with a history of hypertension, coronary artery disease, type 2 diabetes and renal transplant in 2012 at [facility], on CellCept and Prograf (has appt this coming Tuesday), who presented to the ED on 04/26/20 with fever/chills and dysuria without hematuria or flank pain.    In the ED, Tmax 101.3, HR 106, CBC with leukocytosis 14.4k with neutrophilia, normal lactic acid.  BMP with Cr 1.66 (from 0.88 in Feb).  UA was consistent with UTI.  Met sepsis criteria.   CT abd/pelvis renal stone study showed circumferential bladder wall thickening and some mild stranding about the transplant ureter, concerning for cystitis and ascending urinary tract infection. Borderline prostatomegaly with hazy stranding about the prostate and seminal vesicles, which could a concomitant prostatitis as well.  Treated in the ED with vancomycin and Rocephin, and IV fluids per sepsis protocol. Admitted to hospitalist service with urology consulted.

## 2020-04-27 NOTE — ED Notes (Signed)
Pt with chills, feels warm to touch. Temp 101.3.

## 2020-04-27 NOTE — ED Notes (Signed)
Pt moved to Rm 31. Pt conscious alert, with no complaints.

## 2020-04-28 ENCOUNTER — Other Ambulatory Visit: Payer: Self-pay

## 2020-04-28 LAB — CBC
HCT: 36 % — ABNORMAL LOW (ref 39.0–52.0)
Hemoglobin: 13.2 g/dL (ref 13.0–17.0)
MCH: 30.8 pg (ref 26.0–34.0)
MCHC: 36.7 g/dL — ABNORMAL HIGH (ref 30.0–36.0)
MCV: 84.1 fL (ref 80.0–100.0)
Platelets: 105 10*3/uL — ABNORMAL LOW (ref 150–400)
RBC: 4.28 MIL/uL (ref 4.22–5.81)
RDW: 12.8 % (ref 11.5–15.5)
WBC: 14.5 10*3/uL — ABNORMAL HIGH (ref 4.0–10.5)
nRBC: 0 % (ref 0.0–0.2)

## 2020-04-28 LAB — GLUCOSE, CAPILLARY
Glucose-Capillary: 149 mg/dL — ABNORMAL HIGH (ref 70–99)
Glucose-Capillary: 286 mg/dL — ABNORMAL HIGH (ref 70–99)
Glucose-Capillary: 191 mg/dL — ABNORMAL HIGH (ref 70–99)
Glucose-Capillary: 129 mg/dL — ABNORMAL HIGH (ref 70–99)

## 2020-04-28 LAB — BASIC METABOLIC PANEL
Anion gap: 8 (ref 5–15)
BUN: 25 mg/dL — ABNORMAL HIGH (ref 8–23)
CO2: 25 mmol/L (ref 22–32)
Calcium: 7.9 mg/dL — ABNORMAL LOW (ref 8.9–10.3)
Chloride: 103 mmol/L (ref 98–111)
Creatinine, Ser: 1.22 mg/dL (ref 0.61–1.24)
GFR calc Af Amer: >60 mL/min (ref >60)
GFR calc non Af Amer: >60 mL/min (ref >60)
Glucose, Bld: 131 mg/dL — ABNORMAL HIGH (ref 70–99)
Potassium: 3.1 mmol/L — ABNORMAL LOW (ref 3.5–5.1)
Sodium: 136 mmol/L (ref 135–145)

## 2020-04-28 LAB — MAGNESIUM: Magnesium: 1.5 mg/dL — ABNORMAL LOW (ref 1.7–2.4)

## 2020-04-28 MED ORDER — VANCOMYCIN HCL 1250 MG/250ML IV SOLN
1250.0000 mg | INTRAVENOUS | Status: DC
  Administered 2020-04-28: 1250 mg via INTRAVENOUS
  Filled 2020-04-28 (×2): qty 250

## 2020-04-28 MED ORDER — PHENAZOPYRIDINE HCL 100 MG PO TABS
100.0000 mg | ORAL_TABLET | Freq: Three times a day (TID) | ORAL | Status: DC
  Administered 2020-04-28 (×2): 100 mg via ORAL
  Filled 2020-04-28 (×4): qty 1

## 2020-04-28 MED ORDER — SODIUM CHLORIDE 0.9% FLUSH
3.0000 mL | Freq: Two times a day (BID) | INTRAVENOUS | Status: DC
  Administered 2020-04-28 – 2020-04-29 (×2): 3 mL via INTRAVENOUS

## 2020-04-28 MED ORDER — POTASSIUM CHLORIDE CRYS ER 20 MEQ PO TBCR
40.0000 meq | EXTENDED_RELEASE_TABLET | ORAL | Status: AC
  Administered 2020-04-28 (×2): 40 meq via ORAL
  Filled 2020-04-28 (×2): qty 2

## 2020-04-28 MED ORDER — MAGNESIUM SULFATE 4 GM/100ML IV SOLN
4.0000 g | Freq: Once | INTRAVENOUS | Status: AC
  Administered 2020-04-28: 4 g via INTRAVENOUS
  Filled 2020-04-28: qty 100

## 2020-04-28 NOTE — Progress Notes (Signed)
Mobility Specialist - Progress Note   04/28/20 1600  Mobility  Activity Ambulated in hall  Level of Assistance Modified independent, requires aide device or extra time  Assistive Device None (Pt pushed IV pole)  Distance Ambulated (ft) 700 ft  Mobility Response Tolerated well  Mobility performed by Mobility specialist  $Mobility charge 1 Mobility    Pre-mobility: 78 HR, 119/60 BP, 94% SpO2 During mobility: 83 HR, 92% SpO2 Post-mobility: 92 HR, 140/77 BP, 99% SpO2   Pt was lying in bed upon arrival. Pt agreed to session. Pt denied any pain, dizziness, nausea, or fatigue. Pt was SBA getting EOB and modI for ambulation. Pt pushed own IV pole during session. Pt stated that he is "crippled in left leg". Pt presented steady speed and good balance during ambulation with a R lateral lean. Pt denied SOB and no LOB was noted. Pt stated that he is very active at home and "works out everyday at the gym." Pt was really motivated this session and tolerated very well. Upon returning to room, pt suggest that he sits up for a bit and was left in recliner with all needs in reach.    Kathee Delton Mobility Specialist 04/28/20, 4:34 PM

## 2020-04-28 NOTE — Progress Notes (Addendum)
PROGRESS NOTE    [name]   JTT:[id number]  DOB: [birth year]  PCP: Lianne Bushy, MD    DOA: 04/27/2020 LOS: 1   Brief Narrative   [name]  is a 64 y.o. Hispanic American male with a history of hypertension, coronary artery disease, type 2 diabetes and renal transplant in 2012 at [facility], on CellCept and Prograf (has appt this coming Tuesday), who presented to the ED on 04/26/20 with fever/chills and dysuria without hematuria or flank pain.    In the ED, Tmax 101.3, HR 106, CBC with leukocytosis 14.4k with neutrophilia, normal lactic acid.  BMP with Cr 1.66 (from 0.88 in Feb).  UA was consistent with UTI.  Met sepsis criteria.   CT abd/pelvis renal stone study showed circumferential bladder wall thickening and some mild stranding about the transplant ureter, concerning for cystitis and ascending urinary tract infection. Borderline prostatomegaly with hazy stranding about the prostate and seminal vesicles, which could a concomitant prostatitis as well.  Treated in the ED with vancomycin and Rocephin, and IV fluids per sepsis protocol. Admitted to hospitalist service with urology consulted.     Assessment & Plan   Active Problems:   Sepsis due to gram-negative UTI (HCC)   Severe Sepsis secondary to UTI, possible prostatitis - present on admission.  Sepsis manifested by leukocytosis, fever and tachycardia in setting of UTI.  Acute kidney injury reflect organ dysfunction.   --Continue Vancomycin, Rocephin given immunosuppression --follow cultures - urine growing E. coli --urology consulted --stop IV fluids today --start pyridium for severe dysuria  Acute Kidney Injury - Resolved with IV hydration.  Present on admission with Cr 1.66, from 0.88 previously.  Due to sepsis/infection most likely.  Hold losartan.  Avoid nephrotoxins and hypotension.  Follow BMP.  Off IV fluids.  Status post renal transplant - Continue Prograf and CellCept.  Paroxysmal A-fib  / A-fib with RVR - Continue Eliquis and Coreg.  Intermittently with rapid A-fib due to sepsis.  Monitor closely. Telemetry.  Would use Cardizem or increase Coreg if needed for rate control.  Essential hypertension - Continue amlodipine and Coreg.  Hold Cozaar given AKI.  Dyslipidemia - Continue statin   Type 2 diabetes mellitus - Continue basal coverage, sliding scale Novolog.  BPH - Continue Flomax    DVT prophylaxis:  apixaban (ELIQUIS) tablet 5 mg   Diet:  Diet Orders (From admission, onward)    Start     Ordered   04/27/20 0622  Diet Carb Modified Fluid consistency: Thin; Room service appropriate? Yes  Diet effective now       Question Answer Comment  Diet-HS Snack? Nothing   Calorie Level Medium 1600-2000   Fluid consistency: Thin   Room service appropriate? Yes      04/27/20 0621            Code Status: Full Code    Subjective 04/28/20    Patient seen up in chair this AM.  He reports ongoing severe dysuria.  Says his appetite is still poor.  No F/C/N/V or other acute complaints.     Disposition Plan & Communication   Status is: Inpatient  Remains inpatient appropriate because:IV treatments appropriate due to intensity of illness or inability to take PO   Dispo: The patient is from: Home              Anticipated d/c is to: Home              Anticipated d/c date  is: 3 days              Patient currently is not medically stable to d/c.   Family Communication: none at bedside will attempt to call    Consults, Procedures, Significant Events   Consultants:   Urology  Procedures:   none  Antimicrobials:  Anti-infectives (From admission, onward)   Start     Dose/Rate Route Frequency Ordered Stop   04/28/20 1200  vancomycin (VANCOREADY) IVPB 1250 mg/250 mL        1,250 mg 166.7 mL/hr over 90 Minutes Intravenous Every 24 hours 04/28/20 1053     04/28/20 1000  cefTRIAXone (ROCEPHIN) 1 g in sodium chloride 0.9 % 100 mL IVPB        1 g 200 mL/hr  over 30 Minutes Intravenous Every 24 hours 04/27/20 0440     04/27/20 1200  vancomycin (VANCOCIN) IVPB 1000 mg/200 mL premix  Status:  Discontinued        1,000 mg 200 mL/hr over 60 Minutes Intravenous Every 24 hours 04/27/20 0713 04/28/20 1053   04/27/20 0345  cefTRIAXone (ROCEPHIN) 1 g in sodium chloride 0.9 % 100 mL IVPB        1 g 200 mL/hr over 30 Minutes Intravenous  Once 04/27/20 0334 04/27/20 0402   04/27/20 0345  vancomycin (VANCOCIN) IVPB 1000 mg/200 mL premix        1,000 mg 200 mL/hr over 60 Minutes Intravenous  Once 04/27/20 0339 04/27/20 0506        Objective   Vitals:   04/28/20 1000 04/28/20 1054 04/28/20 1100 04/28/20 1252  BP:  96/82  (!) 122/59  Pulse: (!) 105 (!) 133  73  Resp:  '19 20 19  ' Temp:  98.7 F (37.1 C)  98.7 F (37.1 C)  TempSrc:  Oral  Oral  SpO2:  94%  94%  Weight:      Height:        Intake/Output Summary (Last 24 hours) at 04/28/2020 1321 Last data filed at 04/28/2020 0700 Gross per 24 hour  Intake 781.88 ml  Output 575 ml  Net 206.88 ml   Filed Weights   04/27/20 0650 04/28/20 0000 04/28/20 0412  Weight: 72.6 kg 74.9 kg 74.9 kg    Physical Exam:  General exam: awake, alert, no acute distress Respiratory system: CTAB, no wheezes, rales or rhonchi, normal respiratory effort. Cardiovascular system: normal S1/S2, RRR, no JVD, murmurs, rubs, gallops, no pedal edema.   Gastrointestinal system: soft, non-distended, non-tender without guarding or rebound Central nervous system: A&O x3. no gross focal neurologic deficits, normal speech Skin: diaphoretic, intact, warm Psychiatry: normal mood, congruent affect, judgement and insight appear normal  Labs   Data Reviewed: I have personally reviewed following labs and imaging studies  CBC: Recent Labs  Lab 04/26/20 2156 04/27/20 0652 04/28/20 0514  WBC 14.4* 12.5* 14.5*  NEUTROABS 12.8*  --   --   HGB 14.6 12.8* 13.2  HCT 41.7 36.8* 36.0*  MCV 86.3 86.8 84.1  PLT 157 112* 105*    Basic Metabolic Panel: Recent Labs  Lab 04/26/20 2156 04/27/20 0652 04/28/20 0514  NA 133* 132* 136  K 3.5 3.2* 3.1*  CL 101 101 103  CO2 20* 20* 25  GLUCOSE 267* 213* 131*  BUN 41* 36* 25*  CREATININE 1.66* 1.41* 1.22  CALCIUM 8.9 8.0* 7.9*  MG  --   --  1.5*   GFR: Estimated Creatinine Clearance: 58.4 mL/min (by C-G formula based on SCr of  1.22 mg/dL). Liver Function Tests: Recent Labs  Lab 04/26/20 2156  AST 25  ALT 20  ALKPHOS 43  BILITOT 1.2  PROT 7.5  ALBUMIN 4.2   No results for input(s): LIPASE, AMYLASE in the last 168 hours. No results for input(s): AMMONIA in the last 168 hours. Coagulation Profile: Recent Labs  Lab 04/27/20 0652  INR 1.5*   Cardiac Enzymes: No results for input(s): CKTOTAL, CKMB, CKMBINDEX, TROPONINI in the last 168 hours. BNP (last 3 results) No results for input(s): PROBNP in the last 8760 hours. HbA1C: Recent Labs    04/27/20 0652  HGBA1C 6.5*   CBG: Recent Labs  Lab 04/27/20 1400 04/27/20 1818 04/27/20 2204 04/28/20 0755 04/28/20 1227  GLUCAP 250* 206* 175* 149* 286*   Lipid Profile: No results for input(s): CHOL, HDL, LDLCALC, TRIG, CHOLHDL, LDLDIRECT in the last 72 hours. Thyroid Function Tests: No results for input(s): TSH, T4TOTAL, FREET4, T3FREE, THYROIDAB in the last 72 hours. Anemia Panel: No results for input(s): VITAMINB12, FOLATE, FERRITIN, TIBC, IRON, RETICCTPCT in the last 72 hours. Sepsis Labs: Recent Labs  Lab 04/27/20 0212 04/27/20 0652  PROCALCITON  --  0.54  0.54  LATICACIDVEN 1.0 1.7    Recent Results (from the past 240 hour(s))  Urine Culture     Status: Abnormal (Preliminary result)   Collection Time: 04/26/20  9:56 PM   Specimen: Urine, Random  Result Value Ref Range Status   Specimen Description   Final    URINE, RANDOM Performed at [facility], [address] [place], [place] [address]    Special Requests   Final    NONE Performed at [facility], [address], [place], [place] [address]    Culture (A)  Final    >=100,000 COLONIES/mL ESCHERICHIA COLI SUSCEPTIBILITIES TO FOLLOW Performed at [facility], [place] [address], [place], [place] [address]    Report Status PENDING  Incomplete  Culture, blood (routine x 2)     Status: None (Preliminary result)   Collection Time: 04/27/20  2:12 AM   Specimen: BLOOD  Result Value Ref Range Status   Specimen Description BLOOD BLOOD RIGHT HAND  Final   Special Requests   Final    BOTTLES DRAWN AEROBIC AND ANAEROBIC Blood Culture adequate volume   Culture   Final    NO GROWTH 1 DAY Performed at [facility], [address], [place], [place] [address]    Report Status PENDING  Incomplete  Culture, blood (routine x 2)     Status: None (Preliminary result)   Collection Time: 04/27/20  2:29 AM   Specimen: BLOOD  Result Value Ref Range Status   Specimen Description BLOOD RT HAND  Final   Special Requests   Final    BOTTLES DRAWN AEROBIC AND ANAEROBIC Blood Culture adequate volume   Culture   Final    NO GROWTH 1 DAY Performed at [facility], [address], [place], [place] [address]    Report Status PENDING  Incomplete  Respiratory Panel by RT PCR (Flu A&B, Covid) - Nasopharyngeal Swab     Status: None   Collection Time: 04/27/20  3:49 AM   Specimen: Nasopharyngeal Swab  Result Value Ref Range Status   SARS Coronavirus 2 by RT PCR NEGATIVE NEGATIVE Final    Comment: (NOTE) SARS-CoV-2 target nucleic acids are NOT DETECTED.  The SARS-CoV-2 RNA is generally detectable in upper respiratoy specimens during the acute phase of infection. The lowest concentration of SARS-CoV-2  viral copies this assay can detect is 131 copies/mL. A negative result does not preclude SARS-Cov-2 infection and should not be used as the sole basis for treatment or other patient management decisions. A negative result may occur with  improper specimen collection/handling, submission of  specimen other than nasopharyngeal swab, presence of viral mutation(s) within the areas targeted by this assay, and inadequate number of viral copies (<131 copies/mL). A negative result must be combined with clinical observations, patient history, and epidemiological information. The expected result is Negative.  Fact Sheet for Patients:  PinkCheek.be  Fact Sheet for Healthcare Providers:  GravelBags.it  This test is no t yet approved or cleared by the [place] FDA and  has been authorized for detection and/or diagnosis of SARS-CoV-2 by FDA under an Emergency Use Authorization (EUA). This EUA will remain  in effect (meaning this test can be used) for the duration of the COVID-19 declaration under Section 564(b)(1) of the Act, 21 U.S.C. section 360bbb-3(b)(1), unless the authorization is terminated or revoked sooner.     Influenza A by PCR NEGATIVE NEGATIVE Final   Influenza B by PCR NEGATIVE NEGATIVE Final    Comment: (NOTE) The Xpert Xpress SARS-CoV-2/FLU/RSV assay is intended as an aid in  the diagnosis of influenza from Nasopharyngeal swab specimens and  should not be used as a sole basis for treatment. Nasal washings and  aspirates are unacceptable for Xpert Xpress SARS-CoV-2/FLU/RSV  testing.  Fact Sheet for Patients: PinkCheek.be  Fact Sheet for Healthcare Providers: GravelBags.it  This test is not yet approved or cleared by the [place] FDA and  has been authorized for detection and/or diagnosis of SARS-CoV-2 by  FDA under an Emergency Use Authorization (EUA). This EUA will remain  in effect (meaning this test can be used) for the duration of the  Covid-19 declaration under Section 564(b)(1) of the Act, 21  U.S.C. section 360bbb-3(b)(1), unless the authorization is  terminated or revoked. Performed at [facility], [address] [place], [place] [address]       Imaging Studies   CT Renal Stone Study  Result Date: 04/27/2020 CLINICAL DATA:  Flank pain, stone disease suspected, urinary hesitancy, chills, history of renal transplant EXAM: CT ABDOMEN AND PELVIS WITHOUT CONTRAST TECHNIQUE: Multidetector CT imaging of the abdomen and pelvis was performed following the standard protocol without IV contrast. COMPARISON:  CT 04/12/2010, pelvic ultrasound 05/11/2011 lumbar MR 05/25/2011 FINDINGS: Lower chest: Evidence of prior sternotomy. Cardiac size is top normal with calcification of the native coronary arteries. No pericardial effusion. Some bandlike opacities in the lung bases likely reflect areas of atelectasis or scarring. Some mild dependent atelectasis as well. Hepatobiliary: No visible liver lesions on this unenhanced CT. Smooth liver surface contour. Normal liver attenuation. Normal gallbladder and biliary tree without visible calcified gallstone, pericholecystic inflammation or ductal dilatation. Pancreas: Unremarkable. No pancreatic ductal dilatation or surrounding inflammatory changes. Spleen: Normal in size. No concerning splenic lesions. Adrenals/Urinary Tract: Normal adrenal glands. Marked atrophy and cortical thinning of the native kidneys. No concerning masses of the native kidneys. Thigh no urolithiasis or hydronephrosis. Nonspecific symmetric bilateral perinephric stranding of the native kidneys. A right lower quadrant transplant kidney is noted. No concerning mass, urolithiasis or hydronephrosis of the transplant kidney. There is however circumferential bladder wall thickening and some mild stranding about the transplant ureter as well. No bladder calculi or debris. Indentation of the bladder base by the enlarged prostate. Stomach/Bowel: Distal esophagus, stomach and duodenal sweep are unremarkable. No small bowel  wall thickening or dilatation. No evidence of obstruction. A normal appendix is visualized. No colonic  dilatation or wall thickening accounting for underdistention. Vascular/Lymphatic: Atherosclerotic calcifications within the abdominal aorta and branch vessels. No aneurysm or ectasia. No enlarged abdominopelvic lymph nodes. Reproductive: Borderline prostatomegaly. Mild hazy stranding about the prostate and seminal vesicles. Other: No abdominopelvic free air or fluid. No bowel containing hernias. Musculoskeletal: Prior left femoral intramedullary nail and transcervical pin without evidence of acute hardware complication. Minimal heterotopic calcification adjacent the left greater trochanter. Chronic deformity of the L2/L3 vertebral bodies likely reflecting sequela of discitis/osteomyelitis better seen on the comparison MRI from 2012. Resulting moderate to severe canal stenosis at this level. No acute osseous abnormality or suspicious osseous lesion. IMPRESSION: 1. Circumferential bladder wall thickening and some mild stranding about the transplant ureter, concerning for cystitis and ascending urinary tract infection. Correlate with urinalysis. 2. Borderline prostatomegaly with hazy stranding about the prostate and seminal vesicles, which could a concomitant prostatitis as well. 3. Marked atrophy and cortical thinning of the native kidneys. 4. Chronic deformity in fusion of the L2-L3 vertebral bodies compatible with sequela of discitis/osteomyelitis better seen on the comparison MRI from 2012. Resulting moderate to severe canal stenosis, similar to comparison MRI. 5. Aortic Atherosclerosis (ICD10-I70.0). Electronically Signed   By: Lovena Le M.D.   On: 04/27/2020 02:53     Medications   Scheduled Meds: . amLODipine  10 mg Oral Daily  . apixaban  5 mg Oral BID  . aspirin EC  81 mg Oral Q0600  . atorvastatin  40 mg Oral Daily  . carvedilol  3.125 mg Oral BID WC  . cholecalciferol  2,000 Units Oral Daily  . insulin aspart  0-15 Units Subcutaneous TID PC & HS  . insulin glargine  24 Units Subcutaneous QHS   . mycophenolate  250 mg Oral BID  . pantoprazole  40 mg Oral Daily  . phenazopyridine  100 mg Oral TID WC  . potassium chloride  40 mEq Oral Q4H  . tacrolimus  1 mg Oral BID  . tamsulosin  0.4 mg Oral BID   Continuous Infusions: . cefTRIAXone (ROCEPHIN)  IV 1 g (04/28/20 1112)  . magnesium sulfate bolus IVPB 4 g (04/28/20 1149)  . vancomycin         LOS: 1 day    Time spent: 25 minutes    Ezekiel Slocumb, DO [facility]  04/28/2020, 1:21 PM    If 7PM-7AM, please contact night-coverage. How to contact the [facility] Attending or Consulting provider [place] or covering provider during after hours [place], for this patient?    1. Check the care team in [facility] and look for a) attending/consulting [facility] provider listed and b) the [facility] team listed 2. Log into [email or web address] and use [place]'s universal password to access. If you do not have the password, please contact the hospital operator. 3. Locate the [facility] provider you are looking for under [facility] and page to a number that you can be directly reached. 4. If you still have difficulty reaching the provider, please page the [facility] (Director on Call) for the Hospitalists listed on amion for assistance.

## 2020-04-28 NOTE — Consult Note (Signed)
Pharmacy Antibiotic Note  [name] is a 64 y.o. male admitted on 04/27/2020 with h/o hypertension, coronary artery disease, type 2 diabetes mellitus and renal transplant in 2012 at [facility], on CellCept and Prograf, who presented to the emergency room with acute onset of fever and chills as well as dysuria without hematuria or flank pain. Pharmacy has been consulted for vancomycin dosing. CT shows possible cystitis and prostatitis. Patient received a vancomycin dose of 1000 mg in the ED.   Day 2 IV abx, WBC 14.4>12.5>14.5, Scr 1.66>1.41>1.22 (baseline 0.88-1.18), last fever 9/26 @0636  100.6 F  Plan:   Change vancomycin 1000 mg IV Q 24 hrs to vancomycin 1250mg  IV q24 hours per dosing nomogram  Obtain vancomycin levels as clinically indicated  Daily SCr to assess renal function while on vancomycin  MD would like to continue vancomycin given immunosuppression   Height: 5\' 8"  (172.7 cm) Weight: 74.9 kg (165 lb 2 oz) IBW/kg (Calculated) : 68.4  Temp (24hrs), Avg:99 F (37.2 C), Min:97.9 F (36.6 C), Max:99.8 F (37.7 C)  Recent Labs  Lab 04/26/20 2156 04/27/20 0212 04/27/20 0652 04/28/20 0514  WBC 14.4*  --  12.5* 14.5*  CREATININE 1.66*  --  1.41* 1.22  LATICACIDVEN  --  1.0 1.7  --     Estimated Creatinine Clearance: 58.4 mL/min (by C-G formula based on SCr of 1.22 mg/dL).    No Known Allergies  Antimicrobials this admission: cefriaxone 9/26 >>  vancomycin 9/26 >>   Microbiology results: 9/26 BCx: NGTD 9/26 UCx: E coli 9/26 influenza A/B: negative 9/26 SARS CoV-2: negative   Thank you for allowing pharmacy to be a part of this patient's care.  Sherilyn Banker, PharmD Pharmacy Resident  04/28/2020 5:07 PM

## 2020-04-28 NOTE — Plan of Care (Signed)
?  Problem: Clinical Measurements: ?Goal: Will remain free from infection ?Outcome: Progressing ?  ?

## 2020-04-29 LAB — CBC WITH DIFFERENTIAL/PLATELET
Abs Immature Granulocytes: 0.15 10*3/uL — ABNORMAL HIGH (ref 0.00–0.07)
Basophils Absolute: 0 10*3/uL (ref 0.0–0.1)
Basophils Relative: 0 %
Eosinophils Absolute: 0.1 10*3/uL (ref 0.0–0.5)
Eosinophils Relative: 1 %
HCT: 37.8 % — ABNORMAL LOW (ref 39.0–52.0)
Hemoglobin: 13.1 g/dL (ref 13.0–17.0)
Immature Granulocytes: 1 %
Lymphocytes Relative: 8 %
Lymphs Abs: 1 10*3/uL (ref 0.7–4.0)
MCH: 30.4 pg (ref 26.0–34.0)
MCHC: 34.7 g/dL (ref 30.0–36.0)
MCV: 87.7 fL (ref 80.0–100.0)
Monocytes Absolute: 1.5 10*3/uL — ABNORMAL HIGH (ref 0.1–1.0)
Monocytes Relative: 12 %
Neutro Abs: 9.7 10*3/uL — ABNORMAL HIGH (ref 1.7–7.7)
Neutrophils Relative %: 78 %
Platelets: 111 10*3/uL — ABNORMAL LOW (ref 150–400)
RBC: 4.31 MIL/uL (ref 4.22–5.81)
RDW: 12.7 % (ref 11.5–15.5)
WBC: 12.4 10*3/uL — ABNORMAL HIGH (ref 4.0–10.5)
nRBC: 0 % (ref 0.0–0.2)

## 2020-04-29 LAB — URINE CULTURE: Culture: >=100000 — AB

## 2020-04-29 LAB — BASIC METABOLIC PANEL
Anion gap: 9 (ref 5–15)
BUN: 23 mg/dL (ref 8–23)
CO2: 24 mmol/L (ref 22–32)
Calcium: 8 mg/dL — ABNORMAL LOW (ref 8.9–10.3)
Chloride: 102 mmol/L (ref 98–111)
Creatinine, Ser: 1.22 mg/dL (ref 0.61–1.24)
GFR calc Af Amer: >60 mL/min (ref >60)
GFR calc non Af Amer: >60 mL/min (ref >60)
Glucose, Bld: 230 mg/dL — ABNORMAL HIGH (ref 70–99)
Potassium: 4 mmol/L (ref 3.5–5.1)
Sodium: 135 mmol/L (ref 135–145)

## 2020-04-29 LAB — MAGNESIUM: Magnesium: 2.2 mg/dL (ref 1.7–2.4)

## 2020-04-29 LAB — GLUCOSE, CAPILLARY: Glucose-Capillary: 228 mg/dL — ABNORMAL HIGH (ref 70–99)

## 2020-04-29 MED ORDER — PHENAZOPYRIDINE HCL 200 MG PO TABS
200.0000 mg | ORAL_TABLET | Freq: Three times a day (TID) | ORAL | Status: DC
  Administered 2020-04-29: 200 mg via ORAL
  Filled 2020-04-29 (×2): qty 1

## 2020-04-29 MED ORDER — CEFDINIR 300 MG PO CAPS
300.0000 mg | ORAL_CAPSULE | Freq: Two times a day (BID) | ORAL | Status: DC
  Administered 2020-04-29: 300 mg via ORAL
  Filled 2020-04-29 (×2): qty 1

## 2020-04-29 MED ORDER — PHENAZOPYRIDINE HCL 200 MG PO TABS
200.0000 mg | ORAL_TABLET | Freq: Three times a day (TID) | ORAL | 0 refills | Status: DC
Start: 2020-04-29 — End: 2020-05-28

## 2020-04-29 MED ORDER — CEFDINIR 300 MG PO CAPS
300.0000 mg | ORAL_CAPSULE | Freq: Two times a day (BID) | ORAL | 0 refills | Status: AC
Start: 2020-04-29 — End: 2020-05-11

## 2020-04-29 NOTE — Progress Notes (Signed)
Discharge instructions explained/pt verbalized understanding. IV and tele removed. Will transport off unit via wheelchair.  

## 2020-04-29 NOTE — TOC Progression Note (Signed)
Transition of Care [facility]) - Progression Note    Patient Details  Name: [name] MRN: [id number] Date of Birth: Feb 19, 1956  [facility]) CM/SW [place], RN Phone Number: 04/29/2020, 9:24 AM  Clinical Narrative:     Patient has insurance and has prescription program with [facility].        Expected Discharge Plan and Services           Expected Discharge Date: 04/29/20                                     Social Determinants of Health (SDOH) Interventions    Readmission Risk Interventions No flowsheet data found.

## 2020-04-29 NOTE — Discharge Summary (Signed)
Physician Discharge Summary  [name] YBW:[id number] DOB: 07-27-1956 DOA: 04/27/2020  PCP: Lianne Bushy, MD  Admit date: 04/27/2020 Discharge date: 04/29/2020  Admitted From: home Disposition:  home  Recommendations for Outpatient Follow-up:  1. Follow up with PCP in 1-2 weeks 2. Please obtain BMP/CBC in one week 3. Please follow up with your transplant physicians at [facility] on 10/6 as scheduled 4. Follow up on patient's blood pressure.  His losartan and chlorthalidone were held during admission and at the time of discharge because pressures were on the soft side.  Please assess BP and resume these if BP will tolerate.  Home Health: No  Equipment/Devices: None   Discharge Condition: Stable  CODE STATUS: Full  Diet recommendation: Heart Healthy / Carb Modified    Discharge Diagnoses: Active Problems:   Sepsis due to gram-negative UTI [facility])    Summary of HPI and Hospital Course:  [name]  is a 64 y.o. Hispanic American male with a history of hypertension, coronary artery disease, type 2 diabetes and renal transplant in 2012 at [facility], on CellCept and Prograf (has appt this coming Tuesday), who presented to the ED on 04/26/20 with fever/chills and dysuria without hematuria or flank pain.    In the ED, Tmax 101.3, HR 106, CBC with leukocytosis 14.4k with neutrophilia, normal lactic acid.  BMP with Cr 1.66 (from 0.88 in Feb).  UA was consistent with UTI.  Met sepsis criteria.   CT abd/pelvis renal stone study showed circumferential bladder wall thickening and some mild stranding about the transplant ureter, concerning for cystitis and ascending urinary tract infection. Borderline prostatomegaly with hazy stranding about the prostate and seminal vesicles, which could a concomitant prostatitis as well.  Treated in the ED with vancomycin and Rocephin, and IV fluids per sepsis protocol. Admitted to hospitalist service with urology consulted.      Severe Sepsis  secondary to UTI, possible prostatitis - present on admission.  Sepsis manifested by leukocytosis, fever and tachycardia in setting of UTI.  Acute kidney injury reflect organ dysfunction.  Treated empiricatlly with Vancomycin, Rocephin given immunosuppression.  Cultures grew pansensitive E. coli Urology consulted - recommended 14 day course of antibiotics given possible pyelonephritis of transplanted kidney. IV fluids were given per sepsis protocol. Started on pyridium for severe dysuria. Patient clinically improved and stable for discharge home today, to complete remaining oral antibiotics (d/c with Omnicef and pyridium).   Acute Kidney Injury - Resolved with IV hydration.  Present on admission with Cr 1.66, from 0.88 previously.  Due to sepsis/infection most likely.  Held losartan.  BMP in follow up.  Status post renal transplant - Continue Prograf and CellCept.  Paroxysmal A-fib / A-fib with RVR - Continue Eliquis and Coreg.  Intermittently with rapid A-fib due to sepsis.  RVR resolved, rate now controlled.  Essential hypertension - Continue amlodipine and Coreg.   Held Cozaar given AKI.  Dyslipidemia - Continue statin   Type 2 diabetes mellitus - Continued on basal coverage, sliding scale Novolog.  BPH - Continue Flomax  Discharge Instructions   Discharge Instructions    Call MD for:  extreme fatigue   Complete by: As directed    Call MD for:  persistant dizziness or light-headedness   Complete by: As directed    Call MD for:  persistant nausea and vomiting   Complete by: As directed    Call MD for:  severe uncontrolled pain   Complete by: As directed    Call MD for:  temperature >100.4   Complete by: As directed    Diet - low sodium heart healthy   Complete by: As directed    Discharge instructions   Complete by: As directed    Please take antibiotic (cefdinir) twice daily for 12 days for your bladder/kidney infection. Do not stop taking it even if you feel better,  because the infection can come back.  I stopped two of your blood pressure medications (losartan and chlorthalidone) for right now.   Your blood pressure here has been normal and on the low side.  If you take your medications, your BP will drop too low.  Please check your BP at home if you can, write them down and bring to your follow up doctor appointments.   Please see primary care doctor next week.  They will decide if BP is okay to restart your medications.   Increase activity slowly   Complete by: As directed      Allergies as of 04/29/2020   No Known Allergies     Medication List    STOP taking these medications   chlorthalidone 25 MG tablet Commonly known as: HYGROTON   losartan 50 MG tablet Commonly known as: COZAAR     TAKE these medications   Advocate Insulin Pen Needles 33G X 4 MM Misc Generic drug: Insulin Pen Needle To use with insulin pen 4 times per day. E 13.9   amLODipine 10 MG tablet Commonly known as: NORVASC Take 10 mg by mouth daily.   aspirin 81 MG EC tablet Take 1 tablet (81 mg total) by mouth daily at 6 (six) AM.   atorvastatin 40 MG tablet Commonly known as: LIPITOR 40 mg daily at 6 PM.   carvedilol 3.125 MG tablet Commonly known as: COREG 2 (two) times daily with a meal.   cefdinir 300 MG capsule Commonly known as: OMNICEF Take 1 capsule (300 mg total) by mouth every 12 (twelve) hours for 12 days.   CellCept 250 MG capsule Generic drug: mycophenolate Take 750 mg by mouth 2 (two) times daily.   Eliquis 5 MG Tabs tablet Generic drug: apixaban Take 5 mg by mouth 2 (two) times daily.   HumaLOG KwikPen 100 UNIT/ML KwikPen Generic drug: insulin lispro Inject 10-12 Units into the skin 3 (three) times daily. Inject 10u daily at breakfast, 10u at lunch and up to 12u at supper per SSI   Lantus SoloStar 100 UNIT/ML Solostar Pen Generic drug: insulin glargine Inject 24 Units into the skin at bedtime.   linagliptin 5 MG Tabs tablet Commonly  known as: TRADJENTA Take 5 mg by mouth daily.   omeprazole 20 MG capsule Commonly known as: PRILOSEC Take 20 mg by mouth daily as needed.   oxyCODONE-acetaminophen 5-325 MG tablet Commonly known as: PERCOCET/ROXICET Take 1 tablet by mouth every 6 (six) hours as needed for moderate pain.   phenazopyridine 200 MG tablet Commonly known as: PYRIDIUM Take 1 tablet (200 mg total) by mouth 3 (three) times daily with meals.   Prograf 1 MG capsule Generic drug: tacrolimus Take 1 mg by mouth 2 (two) times daily.   tamsulosin 0.4 MG Caps capsule Commonly known as: FLOMAX Take 0.4 mg by mouth 2 (two) times daily.   Vitamin D3 50 MCG (2000 UT) capsule Take 2,000 Units by mouth daily.       Follow-up Information    Lianne Bushy, MD. Schedule an appointment as soon as possible for a visit in 1 week(s).   Specialty: Family Medicine  Contact information: [place] [address] [phone]              No Known Allergies  Consultations:  Urology   Procedures/Studies: CT Renal Stone Study  Result Date: 04/27/2020 CLINICAL DATA:  Flank pain, stone disease suspected, urinary hesitancy, chills, history of renal transplant EXAM: CT ABDOMEN AND PELVIS WITHOUT CONTRAST TECHNIQUE: Multidetector CT imaging of the abdomen and pelvis was performed following the standard protocol without IV contrast. COMPARISON:  CT 04/12/2010, pelvic ultrasound 05/11/2011 lumbar MR 05/25/2011 FINDINGS: Lower chest: Evidence of prior sternotomy. Cardiac size is top normal with calcification of the native coronary arteries. No pericardial effusion. Some bandlike opacities in the lung bases likely reflect areas of atelectasis or scarring. Some mild dependent atelectasis as well. Hepatobiliary: No visible liver lesions on this unenhanced CT. Smooth liver surface contour. Normal liver attenuation. Normal gallbladder and biliary tree without visible calcified gallstone, pericholecystic  inflammation or ductal dilatation. Pancreas: Unremarkable. No pancreatic ductal dilatation or surrounding inflammatory changes. Spleen: Normal in size. No concerning splenic lesions. Adrenals/Urinary Tract: Normal adrenal glands. Marked atrophy and cortical thinning of the native kidneys. No concerning masses of the native kidneys. Thigh no urolithiasis or hydronephrosis. Nonspecific symmetric bilateral perinephric stranding of the native kidneys. A right lower quadrant transplant kidney is noted. No concerning mass, urolithiasis or hydronephrosis of the transplant kidney. There is however circumferential bladder wall thickening and some mild stranding about the transplant ureter as well. No bladder calculi or debris. Indentation of the bladder base by the enlarged prostate. Stomach/Bowel: Distal esophagus, stomach and duodenal sweep are unremarkable. No small bowel wall thickening or dilatation. No evidence of obstruction. A normal appendix is visualized. No colonic dilatation or wall thickening accounting for underdistention. Vascular/Lymphatic: Atherosclerotic calcifications within the abdominal aorta and branch vessels. No aneurysm or ectasia. No enlarged abdominopelvic lymph nodes. Reproductive: Borderline prostatomegaly. Mild hazy stranding about the prostate and seminal vesicles. Other: No abdominopelvic free air or fluid. No bowel containing hernias. Musculoskeletal: Prior left femoral intramedullary nail and transcervical pin without evidence of acute hardware complication. Minimal heterotopic calcification adjacent the left greater trochanter. Chronic deformity of the L2/L3 vertebral bodies likely reflecting sequela of discitis/osteomyelitis better seen on the comparison MRI from 2012. Resulting moderate to severe canal stenosis at this level. No acute osseous abnormality or suspicious osseous lesion. IMPRESSION: 1. Circumferential bladder wall thickening and some mild stranding about the transplant  ureter, concerning for cystitis and ascending urinary tract infection. Correlate with urinalysis. 2. Borderline prostatomegaly with hazy stranding about the prostate and seminal vesicles, which could a concomitant prostatitis as well. 3. Marked atrophy and cortical thinning of the native kidneys. 4. Chronic deformity in fusion of the L2-L3 vertebral bodies compatible with sequela of discitis/osteomyelitis better seen on the comparison MRI from 2012. Resulting moderate to severe canal stenosis, similar to comparison MRI. 5. Aortic Atherosclerosis (ICD10-I70.0). Electronically Signed   By: Lovena Le M.D.   On: 04/27/2020 02:53       Subjective: Patient seen this AM.  Reports he feels great, at baseline.  Asks if he can go home.  No chills/fevers.  Dysuria somewhat improved with pyridium.   Discharge Exam: Vitals:   04/29/20 0408 04/29/20 0754  BP: (!) 140/52 (!) 137/53  Pulse: 66 63  Resp: 18 18  Temp: 98.7 F (37.1 C) 98.4 F (36.9 C)  SpO2: 92% 93%   Vitals:   04/28/20 1515 04/28/20 1925 04/29/20 0408 04/29/20 0754  BP: (!) 115/54 (!) 116/33 [name])  140/52 (!) 137/53  Pulse:  68 66 63  Resp: _0 Temp: 99.8 F (37.7 C) 98.7 F (37.1 C) 98.7 F (37.1 C) 98.4 F (36.9 C)  TempSrc:  Oral Oral Oral  SpO2: 93% 93% 92% 93%  Weight:      Height:        General: Pt is alert, awake, not in acute distress Cardiovascular: RRR, S1/S2 +, no rubs, no gallops Respiratory: CTA bilaterally, no wheezing, no rhonchi Abdominal: Soft, NT, ND, bowel sounds + Extremities: no edema, no cyanosis    The results of significant diagnostics from this hospitalization (including imaging, microbiology, ancillary and laboratory) are listed below for reference.     Microbiology: Recent Results (from the past 240 hour(s))  Urine Culture     Status: Abnormal   Collection Time: 04/26/20  9:56 PM   Specimen: Urine, Random  Result Value Ref Range Status   Specimen Description   Final    URINE,  RANDOM Performed at [facility], [address] [place], [place] [address]    Special Requests   Final    NONE Performed at [facility], [address] [place], [place] [address]    Culture >=100,000 COLONIES/mL ESCHERICHIA COLI (A)  Final   Report Status 04/29/2020 FINAL  Final   Organism ID, Bacteria ESCHERICHIA COLI (A)  Final      Susceptibility   Escherichia coli - MIC*    AMPICILLIN <=2 SENSITIVE Sensitive     CEFAZOLIN <=4 SENSITIVE Sensitive     CEFTRIAXONE <=0.25 SENSITIVE Sensitive     CIPROFLOXACIN <=0.25 SENSITIVE Sensitive     GENTAMICIN <=1 SENSITIVE Sensitive     IMIPENEM <=0.25 SENSITIVE Sensitive     NITROFURANTOIN <=16 SENSITIVE Sensitive     TRIMETH/SULFA <=20 SENSITIVE Sensitive     AMPICILLIN/SULBACTAM <=2 SENSITIVE Sensitive     PIP/TAZO <=4 SENSITIVE Sensitive     * >=100,000 COLONIES/mL ESCHERICHIA COLI  Culture, blood (routine x 2)     Status: None (Preliminary result)   Collection Time: 04/27/20  2:12 AM   Specimen: BLOOD  Result Value Ref Range Status   Specimen Description BLOOD BLOOD RIGHT HAND  Final   Special Requests   Final    BOTTLES DRAWN AEROBIC AND ANAEROBIC Blood Culture adequate volume   Culture   Final    NO GROWTH 2 DAYS Performed at [facility], [address], [place], [place] [address]    Report Status PENDING  Incomplete  Culture, blood (routine x 2)     Status: None (Preliminary result)   Collection Time: 04/27/20  2:29 AM   Specimen: BLOOD  Result Value Ref Range Status   Specimen Description BLOOD RT HAND  Final   Special Requests   Final    BOTTLES DRAWN AEROBIC AND ANAEROBIC Blood Culture adequate volume   Culture   Final    NO GROWTH 2 DAYS Performed at [facility], [address], [place], [place] [address]    Report Status PENDING  Incomplete  Respiratory Panel by RT PCR (Flu A&B, Covid) - Nasopharyngeal Swab     Status: None   Collection Time: 04/27/20  3:49 AM    Specimen: Nasopharyngeal Swab  Result Value Ref Range Status   SARS Coronavirus 2 by RT PCR NEGATIVE NEGATIVE Final    Comment: (NOTE) SARS-CoV-2 target nucleic acids are NOT DETECTED.  The SARS-CoV-2 RNA is generally detectable in upper respiratoy specimens during the acute phase of  infection. The lowest concentration of SARS-CoV-2 viral copies this assay can detect is 131 copies/mL. A negative result does not preclude SARS-Cov-2 infection and should not be used as the sole basis for treatment or other patient management decisions. A negative result may occur with  improper specimen collection/handling, submission of specimen other than nasopharyngeal swab, presence of viral mutation(s) within the areas targeted by this assay, and inadequate number of viral copies (<131 copies/mL). A negative result must be combined with clinical observations, patient history, and epidemiological information. The expected result is Negative.  Fact Sheet for Patients:  PinkCheek.be  Fact Sheet for Healthcare Providers:  GravelBags.it  This test is no t yet approved or cleared by the [place] FDA and  has been authorized for detection and/or diagnosis of SARS-CoV-2 by FDA under an Emergency Use Authorization (EUA). This EUA will remain  in effect (meaning this test can be used) for the duration of the COVID-19 declaration under Section 564(b)(1) of the Act, 21 U.S.C. section 360bbb-3(b)(1), unless the authorization is terminated or revoked sooner.     Influenza A by PCR NEGATIVE NEGATIVE Final   Influenza B by PCR NEGATIVE NEGATIVE Final    Comment: (NOTE) The Xpert Xpress SARS-CoV-2/FLU/RSV assay is intended as an aid in  the diagnosis of influenza from Nasopharyngeal swab specimens and  should not be used as a sole basis for treatment. Nasal washings and  aspirates are unacceptable for Xpert Xpress SARS-CoV-2/FLU/RSV   testing.  Fact Sheet for Patients: PinkCheek.be  Fact Sheet for Healthcare Providers: GravelBags.it  This test is not yet approved or cleared by the [place] FDA and  has been authorized for detection and/or diagnosis of SARS-CoV-2 by  FDA under an Emergency Use Authorization (EUA). This EUA will remain  in effect (meaning this test can be used) for the duration of the  Covid-19 declaration under Section 564(b)(1) of the Act, 21  U.S.C. section 360bbb-3(b)(1), unless the authorization is  terminated or revoked. Performed at [facility], [address] [place], [place] [address]      Labs: BNP (last 3 results) No results for input(s): BNP in the last 8760 hours. Basic Metabolic Panel: Recent Labs  Lab 04/26/20 2156 04/27/20 0652 04/28/20 0514 04/29/20 0337  NA 133* 132* 136 135  K 3.5 3.2* 3.1* 4.0  CL 101 101 103 102  CO2 20* 20* 25 24  GLUCOSE 267* 213* 131* 230*  BUN 41* 36* 25* 23  CREATININE 1.66* 1.41* 1.22 1.22  CALCIUM 8.9 8.0* 7.9* 8.0*  MG  --   --  1.5* 2.2   Liver Function Tests: Recent Labs  Lab 04/26/20 2156  AST 25  ALT 20  ALKPHOS 43  BILITOT 1.2  PROT 7.5  ALBUMIN 4.2   No results for input(s): LIPASE, AMYLASE in the last 168 hours. No results for input(s): AMMONIA in the last 168 hours. CBC: Recent Labs  Lab 04/26/20 2156 04/27/20 0652 04/28/20 0514 04/29/20 0337  WBC 14.4* 12.5* 14.5* 12.4*  NEUTROABS 12.8*  --   --  9.7*  HGB 14.6 12.8* 13.2 13.1  HCT 41.7 36.8* 36.0* 37.8*  MCV 86.3 86.8 84.1 87.7  PLT 157 112* 105* 111*   Cardiac Enzymes: No results for input(s): CKTOTAL, CKMB, CKMBINDEX, TROPONINI in the last 168 hours. BNP: Invalid input(s): POCBNP CBG: Recent Labs  Lab 04/28/20 0755 04/28/20 1227 04/28/20 1711 04/28/20 2215 04/29/20 0842  GLUCAP 149* 286* 191* 129* 228*   D-Dimer No results for input(s): DDIMER  in the last 72 hours. Hgb  A1c Recent Labs    04/27/20 0652  HGBA1C 6.5*   Lipid Profile No results for input(s): CHOL, HDL, LDLCALC, TRIG, CHOLHDL, LDLDIRECT in the last 72 hours. Thyroid function studies No results for input(s): TSH, T4TOTAL, T3FREE, THYROIDAB in the last 72 hours.  Invalid input(s): FREET3 Anemia work up No results for input(s): VITAMINB12, FOLATE, FERRITIN, TIBC, IRON, RETICCTPCT in the last 72 hours. Urinalysis    Component Value Date/Time   COLORURINE AMBER (A) 04/26/2020 2156   APPEARANCEUR CLOUDY (A) 04/26/2020 2156   LABSPEC 1.027 04/26/2020 2156   PHURINE 5.0 04/26/2020 2156   GLUCOSEU NEGATIVE 04/26/2020 2156   HGBUR LARGE (A) 04/26/2020 2156   BILIRUBINUR NEGATIVE 04/26/2020 2156   KETONESUR NEGATIVE 04/26/2020 2156   PROTEINUR >=300 (A) 04/26/2020 2156   UROBILINOGEN 1.0 12/16/2010 1357   NITRITE NEGATIVE 04/26/2020 2156   LEUKOCYTESUR LARGE (A) 04/26/2020 2156   Sepsis Labs Invalid input(s): PROCALCITONIN,  WBC,  LACTICIDVEN Microbiology Recent Results (from the past 240 hour(s))  Urine Culture     Status: Abnormal   Collection Time: 04/26/20  9:56 PM   Specimen: Urine, Random  Result Value Ref Range Status   Specimen Description   Final    URINE, RANDOM Performed at [facility], [address] [place], [place] [address]    Special Requests   Final    NONE Performed at [facility], [address] [place], [place] [address]    Culture >=100,000 COLONIES/mL ESCHERICHIA COLI (A)  Final   Report Status 04/29/2020 FINAL  Final   Organism ID, Bacteria ESCHERICHIA COLI (A)  Final      Susceptibility   Escherichia coli - MIC*    AMPICILLIN <=2 SENSITIVE Sensitive     CEFAZOLIN <=4 SENSITIVE Sensitive     CEFTRIAXONE <=0.25 SENSITIVE Sensitive     CIPROFLOXACIN <=0.25 SENSITIVE Sensitive     GENTAMICIN <=1 SENSITIVE Sensitive     IMIPENEM <=0.25 SENSITIVE Sensitive     NITROFURANTOIN <=16 SENSITIVE Sensitive     TRIMETH/SULFA <=20 SENSITIVE  Sensitive     AMPICILLIN/SULBACTAM <=2 SENSITIVE Sensitive     PIP/TAZO <=4 SENSITIVE Sensitive     * >=100,000 COLONIES/mL ESCHERICHIA COLI  Culture, blood (routine x 2)     Status: None (Preliminary result)   Collection Time: 04/27/20  2:12 AM   Specimen: BLOOD  Result Value Ref Range Status   Specimen Description BLOOD BLOOD RIGHT HAND  Final   Special Requests   Final    BOTTLES DRAWN AEROBIC AND ANAEROBIC Blood Culture adequate volume   Culture   Final    NO GROWTH 2 DAYS Performed at [facility], [address] [place], [place] [address]    Report Status PENDING  Incomplete  Culture, blood (routine x 2)     Status: None (Preliminary result)   Collection Time: 04/27/20  2:29 AM   Specimen: BLOOD  Result Value Ref Range Status   Specimen Description BLOOD RT HAND  Final   Special Requests   Final    BOTTLES DRAWN AEROBIC AND ANAEROBIC Blood Culture adequate volume   Culture   Final    NO GROWTH 2 DAYS Performed at [facility], [address] [place], [place] [address]    Report Status PENDING  Incomplete  Respiratory Panel by RT PCR (Flu A&B, Covid) - Nasopharyngeal Swab     Status: None   Collection Time: 04/27/20  3:49 AM   Specimen:  Nasopharyngeal Swab  Result Value Ref Range Status   SARS Coronavirus 2 by RT PCR NEGATIVE NEGATIVE Final    Comment: (NOTE) SARS-CoV-2 target nucleic acids are NOT DETECTED.  The SARS-CoV-2 RNA is generally detectable in upper respiratoy specimens during the acute phase of infection. The lowest concentration of SARS-CoV-2 viral copies this assay can detect is 131 copies/mL. A negative result does not preclude SARS-Cov-2 infection and should not be used as the sole basis for treatment or other patient management decisions. A negative result may occur with  improper specimen collection/handling, submission of specimen other than nasopharyngeal swab, presence of viral mutation(s) within the areas targeted by this  assay, and inadequate number of viral copies (<131 copies/mL). A negative result must be combined with clinical observations, patient history, and epidemiological information. The expected result is Negative.  Fact Sheet for Patients:  PinkCheek.be  Fact Sheet for Healthcare Providers:  GravelBags.it  This test is no t yet approved or cleared by the [place] FDA and  has been authorized for detection and/or diagnosis of SARS-CoV-2 by FDA under an Emergency Use Authorization (EUA). This EUA will remain  in effect (meaning this test can be used) for the duration of the COVID-19 declaration under Section 564(b)(1) of the Act, 21 U.S.C. section 360bbb-3(b)(1), unless the authorization is terminated or revoked sooner.     Influenza A by PCR NEGATIVE NEGATIVE Final   Influenza B by PCR NEGATIVE NEGATIVE Final    Comment: (NOTE) The Xpert Xpress SARS-CoV-2/FLU/RSV assay is intended as an aid in  the diagnosis of influenza from Nasopharyngeal swab specimens and  should not be used as a sole basis for treatment. Nasal washings and  aspirates are unacceptable for Xpert Xpress SARS-CoV-2/FLU/RSV  testing.  Fact Sheet for Patients: PinkCheek.be  Fact Sheet for Healthcare Providers: GravelBags.it  This test is not yet approved or cleared by the [place] FDA and  has been authorized for detection and/or diagnosis of SARS-CoV-2 by  FDA under an Emergency Use Authorization (EUA). This EUA will remain  in effect (meaning this test can be used) for the duration of the  Covid-19 declaration under Section 564(b)(1) of the Act, 21  U.S.C. section 360bbb-3(b)(1), unless the authorization is  terminated or revoked. Performed at [facility], [address] [place], [place] [address]      Time coordinating discharge: Over 30 minutes  SIGNED:   Ezekiel Slocumb, DO [facility] 04/29/2020, 9:18 AM   If 7PM-7AM, please contact night-coverage [email or web address]

## 2020-05-02 LAB — CULTURE, BLOOD (ROUTINE X 2)
Culture: NO GROWTH
Culture: NO GROWTH
Special Requests: ADEQUATE
Special Requests: ADEQUATE

## 2020-05-07 ENCOUNTER — Encounter: Admit: 2020-05-07 | Discharge: 2020-05-07 | Payer: MEDICARE

## 2020-05-07 ENCOUNTER — Ambulatory Visit: Admit: 2020-05-07 | Discharge: 2020-05-07 | Payer: MEDICARE

## 2020-05-07 ENCOUNTER — Ambulatory Visit: Admit: 2020-05-07 | Discharge: 2020-05-07 | Payer: MEDICARE | Attending: Nephrology | Primary: Nephrology

## 2020-05-07 DIAGNOSIS — Z79899 Other long term (current) drug therapy: Principal | ICD-10-CM

## 2020-05-07 DIAGNOSIS — Z940 Kidney transplant status: Principal | ICD-10-CM

## 2020-05-07 DIAGNOSIS — E119 Type 2 diabetes mellitus without complications: Principal | ICD-10-CM

## 2020-05-07 DIAGNOSIS — E559 Vitamin D deficiency, unspecified: Principal | ICD-10-CM

## 2020-05-14 ENCOUNTER — Encounter: Admit: 2020-05-14 | Discharge: 2020-05-15 | Payer: MEDICARE

## 2020-05-19 ENCOUNTER — Inpatient Hospital Stay
Admission: EM | Admit: 2020-05-19 | Discharge: 2020-05-22 | DRG: 872 | Disposition: A | Discharge status: Home / Self Care | Payer: Medicare Other | Attending: Internal Medicine | Admitting: Internal Medicine

## 2020-05-19 ENCOUNTER — Emergency Department: Payer: Medicare Other

## 2020-05-19 ENCOUNTER — Encounter: Payer: Self-pay | Admitting: Emergency Medicine

## 2020-05-19 ENCOUNTER — Other Ambulatory Visit: Payer: Self-pay

## 2020-05-19 DIAGNOSIS — I252 Old myocardial infarction: Secondary | ICD-10-CM

## 2020-05-19 DIAGNOSIS — Z20822 Contact with and (suspected) exposure to covid-19: Secondary | ICD-10-CM | POA: Diagnosis present

## 2020-05-19 DIAGNOSIS — E119 Type 2 diabetes mellitus without complications: Secondary | ICD-10-CM

## 2020-05-19 DIAGNOSIS — Z79899 Other long term (current) drug therapy: Secondary | ICD-10-CM

## 2020-05-19 DIAGNOSIS — I4892 Unspecified atrial flutter: Secondary | ICD-10-CM | POA: Diagnosis present

## 2020-05-19 DIAGNOSIS — E876 Hypokalemia: Secondary | ICD-10-CM | POA: Diagnosis present

## 2020-05-19 DIAGNOSIS — Z7982 Long term (current) use of aspirin: Secondary | ICD-10-CM

## 2020-05-19 DIAGNOSIS — T8619 Other complication of kidney transplant: Secondary | ICD-10-CM | POA: Diagnosis present

## 2020-05-19 DIAGNOSIS — Z955 Presence of coronary angioplasty implant and graft: Secondary | ICD-10-CM

## 2020-05-19 DIAGNOSIS — I4891 Unspecified atrial fibrillation: Secondary | ICD-10-CM

## 2020-05-19 DIAGNOSIS — N400 Enlarged prostate without lower urinary tract symptoms: Secondary | ICD-10-CM | POA: Diagnosis present

## 2020-05-19 DIAGNOSIS — A4151 Sepsis due to Escherichia coli [E. coli]: Principal | ICD-10-CM | POA: Diagnosis present

## 2020-05-19 DIAGNOSIS — Z87891 Personal history of nicotine dependence: Secondary | ICD-10-CM

## 2020-05-19 DIAGNOSIS — Z794 Long term (current) use of insulin: Secondary | ICD-10-CM

## 2020-05-19 DIAGNOSIS — R9431 Abnormal electrocardiogram [ECG] [EKG]: Secondary | ICD-10-CM

## 2020-05-19 DIAGNOSIS — N179 Acute kidney failure, unspecified: Secondary | ICD-10-CM | POA: Diagnosis present

## 2020-05-19 DIAGNOSIS — I493 Ventricular premature depolarization: Secondary | ICD-10-CM | POA: Diagnosis present

## 2020-05-19 DIAGNOSIS — K219 Gastro-esophageal reflux disease without esophagitis: Secondary | ICD-10-CM | POA: Diagnosis present

## 2020-05-19 DIAGNOSIS — R509 Fever, unspecified: Secondary | ICD-10-CM

## 2020-05-19 DIAGNOSIS — A419 Sepsis, unspecified organism: Secondary | ICD-10-CM | POA: Diagnosis not present

## 2020-05-19 DIAGNOSIS — Y830 Surgical operation with transplant of whole organ as the cause of abnormal reaction of the patient, or of later complication, without mention of misadventure at the time of the procedure: Secondary | ICD-10-CM | POA: Diagnosis present

## 2020-05-19 DIAGNOSIS — I480 Paroxysmal atrial fibrillation: Secondary | ICD-10-CM | POA: Diagnosis present

## 2020-05-19 DIAGNOSIS — Z7901 Long term (current) use of anticoagulants: Secondary | ICD-10-CM

## 2020-05-19 DIAGNOSIS — I2510 Atherosclerotic heart disease of native coronary artery without angina pectoris: Secondary | ICD-10-CM | POA: Diagnosis present

## 2020-05-19 DIAGNOSIS — N390 Urinary tract infection, site not specified: Secondary | ICD-10-CM | POA: Diagnosis present

## 2020-05-19 DIAGNOSIS — Z87440 Personal history of urinary (tract) infections: Secondary | ICD-10-CM

## 2020-05-19 DIAGNOSIS — N183 Chronic kidney disease, stage 3 unspecified: Secondary | ICD-10-CM | POA: Diagnosis present

## 2020-05-19 DIAGNOSIS — Z951 Presence of aortocoronary bypass graft: Secondary | ICD-10-CM

## 2020-05-19 DIAGNOSIS — D849 Immunodeficiency, unspecified: Secondary | ICD-10-CM

## 2020-05-19 DIAGNOSIS — E871 Hypo-osmolality and hyponatremia: Secondary | ICD-10-CM | POA: Diagnosis present

## 2020-05-19 DIAGNOSIS — I10 Essential (primary) hypertension: Secondary | ICD-10-CM | POA: Diagnosis present

## 2020-05-19 DIAGNOSIS — E1122 Type 2 diabetes mellitus with diabetic chronic kidney disease: Secondary | ICD-10-CM | POA: Diagnosis present

## 2020-05-19 DIAGNOSIS — I129 Hypertensive chronic kidney disease with stage 1 through stage 4 chronic kidney disease, or unspecified chronic kidney disease: Secondary | ICD-10-CM | POA: Diagnosis present

## 2020-05-19 DIAGNOSIS — Z940 Kidney transplant status: Secondary | ICD-10-CM

## 2020-05-19 LAB — URINALYSIS, COMPLETE (UACMP) WITH MICROSCOPIC
Bacteria, UA: NONE SEEN
Bilirubin Urine: NEGATIVE
Glucose, UA: NEGATIVE mg/dL
Ketones, ur: NEGATIVE mg/dL
Nitrite: NEGATIVE
Protein, ur: 30 mg/dL — AB
Specific Gravity, Urine: 1.025 (ref 1.005–1.030)
WBC, UA: >50 WBC/hpf — ABNORMAL HIGH (ref 0–5)
pH: 5 (ref 5.0–8.0)

## 2020-05-19 LAB — COMPREHENSIVE METABOLIC PANEL
ALT: 20 U/L (ref 0–44)
AST: 22 U/L (ref 15–41)
Albumin: 4 g/dL (ref 3.5–5.0)
Alkaline Phosphatase: 44 U/L (ref 38–126)
Anion gap: 13 (ref 5–15)
BUN: 30 mg/dL — ABNORMAL HIGH (ref 8–23)
CO2: 22 mmol/L (ref 22–32)
Calcium: 8.6 mg/dL — ABNORMAL LOW (ref 8.9–10.3)
Chloride: 97 mmol/L — ABNORMAL LOW (ref 98–111)
Creatinine, Ser: 1.52 mg/dL — ABNORMAL HIGH (ref 0.61–1.24)
GFR, Estimated: 47 mL/min — ABNORMAL LOW (ref >60)
Glucose, Bld: 209 mg/dL — ABNORMAL HIGH (ref 70–99)
Potassium: 3.9 mmol/L (ref 3.5–5.1)
Sodium: 132 mmol/L — ABNORMAL LOW (ref 135–145)
Total Bilirubin: 1.9 mg/dL — ABNORMAL HIGH (ref 0.3–1.2)
Total Protein: 7.3 g/dL (ref 6.5–8.1)

## 2020-05-19 LAB — CBC
HCT: 38.9 % — ABNORMAL LOW (ref 39.0–52.0)
Hemoglobin: 13.5 g/dL (ref 13.0–17.0)
MCH: 30.1 pg (ref 26.0–34.0)
MCHC: 34.7 g/dL (ref 30.0–36.0)
MCV: 86.6 fL (ref 80.0–100.0)
Platelets: 218 10*3/uL (ref 150–400)
RBC: 4.49 MIL/uL (ref 4.22–5.81)
RDW: 13 % (ref 11.5–15.5)
WBC: 15.4 10*3/uL — ABNORMAL HIGH (ref 4.0–10.5)
nRBC: 0 % (ref 0.0–0.2)

## 2020-05-19 LAB — PROTIME-INR
INR: 1.4 — ABNORMAL HIGH (ref 0.8–1.2)
Prothrombin Time: 16.5 seconds — ABNORMAL HIGH (ref 11.4–15.2)

## 2020-05-19 LAB — RESPIRATORY PANEL BY RT PCR (FLU A&B, COVID)
Influenza A by PCR: NEGATIVE
Influenza B by PCR: NEGATIVE
SARS Coronavirus 2 by RT PCR: NEGATIVE

## 2020-05-19 LAB — GLUCOSE, CAPILLARY
Glucose-Capillary: 196 mg/dL — ABNORMAL HIGH (ref 70–99)
Glucose-Capillary: 222 mg/dL — ABNORMAL HIGH (ref 70–99)

## 2020-05-19 LAB — APTT: aPTT: 37 seconds — ABNORMAL HIGH (ref 24–36)

## 2020-05-19 LAB — LACTIC ACID, PLASMA
Lactic Acid, Venous: 1.3 mmol/L (ref 0.5–1.9)
Lactic Acid, Venous: 2.1 mmol/L (ref 0.5–1.9)

## 2020-05-19 MED ORDER — INSULIN ASPART 100 UNIT/ML ~~LOC~~ SOLN
0.0000 [IU] | SUBCUTANEOUS | Status: DC
  Administered 2020-05-19: 5 [IU] via SUBCUTANEOUS
  Administered 2020-05-19 – 2020-05-20 (×2): 3 [IU] via SUBCUTANEOUS
  Administered 2020-05-20: 2 [IU] via SUBCUTANEOUS
  Administered 2020-05-20: 3 [IU] via SUBCUTANEOUS
  Filled 2020-05-19 (×5): qty 1

## 2020-05-19 MED ORDER — SODIUM CHLORIDE 0.9 % IV SOLN
1.0000 g | Freq: Once | INTRAVENOUS | Status: AC
  Administered 2020-05-19: 1 g via INTRAVENOUS
  Filled 2020-05-19: qty 10

## 2020-05-19 MED ORDER — ACETAMINOPHEN 500 MG PO TABS
ORAL_TABLET | ORAL | Status: AC
  Administered 2020-05-19: 1000 mg via ORAL
  Filled 2020-05-19: qty 2

## 2020-05-19 MED ORDER — ACETAMINOPHEN 500 MG PO TABS: 1000.0000 mg | ORAL_TABLET | Freq: Once | ORAL | Status: AC

## 2020-05-19 MED ORDER — ACETAMINOPHEN 325 MG PO TABS
650.0000 mg | ORAL_TABLET | Freq: Once | ORAL | Status: AC
  Administered 2020-05-19: 650 mg via ORAL
  Filled 2020-05-19: qty 2

## 2020-05-19 MED ORDER — APIXABAN 5 MG PO TABS
5.0000 mg | ORAL_TABLET | Freq: Two times a day (BID) | ORAL | Status: DC
  Administered 2020-05-19 – 2020-05-22 (×7): 5 mg via ORAL
  Filled 2020-05-19 (×9): qty 1

## 2020-05-19 MED ORDER — CARVEDILOL 3.125 MG PO TABS
3.1250 mg | ORAL_TABLET | Freq: Two times a day (BID) | ORAL | Status: DC
  Administered 2020-05-19 – 2020-05-22 (×6): 3.125 mg via ORAL
  Filled 2020-05-19 (×6): qty 1

## 2020-05-19 MED ORDER — LACTATED RINGERS IV SOLN: INTRAVENOUS | Status: AC

## 2020-05-19 NOTE — ED Provider Notes (Addendum)
[facility] Emergency Department Provider Note  ____________________________________________   First MD Initiated Contact with Patient 05/19/20 1207     (approximate)  I have reviewed the triage vital signs and the nursing notes.   HISTORY  Chief Complaint No chief complaint on file.   HPI [name] is a 64 y.o. male with a past medical history of CAD status post MI, HTN, DM, and ESRD status post right kidney transplant in 2016 at [facility] who presents for assessment of 3 to 4 days of fever associate with some burning with urination.  Patient states he had a urinary tract infection several weeks ago treated with antibiotics and that this feels very similar to that.  He denies any chest pain, cough, shortness of breath, abdominal pain, vomiting, diarrhea, blood in his urine, blood in stool, rash, extremity pain, other acute complaints.  Denies EtOH or illicit drug use.  States has been compliant with all his immunosuppression medications.         Past Medical History:  Diagnosis Date  . Arthritis   . Chronic kidney disease    KIDNEY TRANSPLANT  . Coronary artery disease   . Diabetes mellitus without complication ([place])   . Dyspnea   . GERD (gastroesophageal reflux disease)   . History of blood transfusion   . Hypertension   . Myocardial infarction [facility])     Patient Active Problem List   Diagnosis Date Noted  . Sepsis due to gram-negative UTI ([place]) 04/27/2020  . Carotid stenosis, left 09/06/2019  . Carotid artery stenosis 08/30/2019  . Coronary artery disease 06/13/2018  . Hypercholesteremia 06/13/2018  . Pulmonary scarring 06/13/2018  . Vitamin D deficiency 06/13/2018  . Chest pain 09/20/2017  . Complication of arteriovenous dialysis fistula 08/23/2017  . Aftercare following organ transplant 02/16/2017  . Kidney transplant recipient 08/20/2016  . Diabetes ([place]) 08/20/2016  . Essential hypertension 08/20/2016  . Immunosuppression ([place])  11/11/2015  . Paroxysmal atrial fibrillation ([place]) 07/22/2015    Past Surgical History:  Procedure Laterality Date  . CATARACT EXTRACTION W/PHACO Right 02/03/2017   Procedure: CATARACT EXTRACTION PHACO AND INTRAOCULAR LENS PLACEMENT (IOC);  Surgeon: Leandrew Koyanagi, MD;  Location: [facility] ORS;  Service: Ophthalmology;  Laterality: Right;  [place] 00:35.8 AP% 12.8 CDE 4.57 Fluid lot # [id number]  . CORONARY ANGIOPLASTY     STENTS X 4  . CORONARY ARTERY BYPASS GRAFT    . ENDARTERECTOMY Left 09/06/2019   Procedure: ENDARTERECTOMY CAROTID;  Surgeon: Algernon Huxley, MD;  Location: [facility] ORS;  Service: Vascular;  Laterality: Left;  . EYE SURGERY Bilateral    cataract  . HIP SURGERY    . KIDNEY TRANSPLANT     2016    Prior to Admission medications   Medication Sig Start Date End Date Taking? Authorizing Provider  amLODipine (NORVASC) 10 MG tablet Take 10 mg by mouth daily.  08/06/16   [provider]  aspirin EC 81 MG EC tablet Take 1 tablet (81 mg total) by mouth daily at 6 (six) AM. 09/08/19   Stegmayer, Janalyn Harder, PA-C  atorvastatin (LIPITOR) 40 MG tablet 40 mg daily at 6 PM.  12/23/14   [provider]  carvedilol (COREG) 3.125 MG tablet 2 (two) times daily with a meal.  06/02/16   [provider]  Cholecalciferol (VITAMIN D3) 50 MCG (2000 UT) capsule Take 2,000 Units by mouth daily.  03/17/16   [provider]  ELIQUIS 5 MG TABS tablet Take 5 mg by mouth 2 (  two) times daily.  04/21/18   [provider]  HUMALOG KWIKPEN 100 UNIT/ML KiwkPen Inject 10-12 Units into the skin 3 (three) times daily. Inject 10u daily at breakfast, 10u at lunch and up to 12u at supper per SSI    [provider]  Insulin Pen Needle (ADVOCATE INSULIN PEN NEEDLES) 33G X 4 MM MISC To use with insulin pen 4 times per day. E 13.9 02/19/16   [provider]  LANTUS SOLOSTAR 100 UNIT/ML Solostar Pen Inject 24 Units into the skin at bedtime.  01/18/15   [provider]   linagliptin (TRADJENTA) 5 MG TABS tablet Take 5 mg by mouth daily.  11/23/19   [provider]  mycophenolate (CELLCEPT) 250 MG capsule Take 750 mg by mouth 2 (two) times daily.  11/14/18 11/22/20  [provider]  omeprazole (PRILOSEC) 20 MG capsule Take 20 mg by mouth daily as needed.     [provider]  oxyCODONE-acetaminophen (PERCOCET/ROXICET) 5-325 MG tablet Take 1 tablet by mouth every 6 (six) hours as needed for moderate pain. Patient not taking: Reported on 04/27/2020 09/07/19   Stegmayer, Janalyn Harder, PA-C  phenazopyridine (PYRIDIUM) 200 MG tablet Take 1 tablet (200 mg total) by mouth 3 (three) times daily with meals. 04/29/20   Nicole Kindred A, DO  PROGRAF 1 MG capsule Take 1 mg by mouth 2 (two) times daily.  08/11/16   [provider]  tamsulosin (FLOMAX) 0.4 MG CAPS capsule Take 0.4 mg by mouth 2 (two) times daily.     [provider]    Allergies Patient has no known allergies.  Family History  Problem Relation Age of Onset  . Drug abuse Sister   . Drug abuse Brother     Social History Social History   Tobacco Use  . Smoking status: Former Research scientist (life sciences)  . Smokeless tobacco: Never Used  . Tobacco comment: 30 years ago  Vaping Use  . Vaping Use: Never used  Substance Use Topics  . Alcohol use: No    Alcohol/week: 0.0 standard drinks  . Drug use: Never    Review of Systems  ROS    ____________________________________________   PHYSICAL EXAM:  VITAL SIGNS: ED Triage Vitals  Enc Vitals Group     BP 05/19/20 0644 (!) 126/59     Pulse Rate 05/19/20 0644 98     Resp 05/19/20 0644 20     Temp 05/19/20 0644 (!) 103 F (39.4 C)     Temp Source 05/19/20 0644 Oral     SpO2 05/19/20 0644 98 %     Weight 05/19/20 0645 162 lb (73.5 kg)     Height 05/19/20 0645 5\' 8"  (1.727 m)     Head Circumference --      Peak Flow --      Pain Score 05/19/20 0705 0     Pain Loc --      Pain Edu? --      Excl. in [place]? --    Vitals:    05/19/20 1239 05/19/20 1330  BP:  (!) 159/59  Pulse:  (!) 105  Resp:  (!) 21  Temp: 98.3 F (36.8 C)   SpO2:  96%   Physical Exam Vitals and nursing note reviewed.  Constitutional:      Appearance: He is well-developed.  HENT:     Head: Normocephalic and atraumatic.     Right Ear: External ear normal.     Left Ear: External ear normal.  Nose: Nose normal.     Mouth/Throat:     Mouth: Mucous membranes are moist.  Eyes:     Conjunctiva/sclera: Conjunctivae normal.  Cardiovascular:     Rate and Rhythm: Normal rate and regular rhythm.     Heart sounds: No murmur heard.   Pulmonary:     Effort: Pulmonary effort is normal. No respiratory distress.     Breath sounds: Normal breath sounds.  Abdominal:     Palpations: Abdomen is soft.     Tenderness: There is no abdominal tenderness.  Musculoskeletal:     Cervical back: Neck supple.  Skin:    General: Skin is warm and dry.     Capillary Refill: Capillary refill takes less than 2 seconds.  Neurological:     Mental Status: He is alert and oriented to person, place, and time.  Psychiatric:        Mood and Affect: Mood normal.      ____________________________________________   LABS (all labs ordered are listed, but only abnormal results are displayed)  Labs Reviewed  URINALYSIS, COMPLETE (UACMP) WITH MICROSCOPIC - Abnormal; Notable for the following components:      Result Value   Color, Urine AMBER (*)    APPearance CLOUDY (*)    Hgb urine dipstick MODERATE (*)    Protein, ur 30 (*)    Leukocytes,Ua MODERATE (*)    WBC, UA >50 (*)    All other components within normal limits  CBC - Abnormal; Notable for the following components:   WBC 15.4 (*)    HCT 38.9 (*)    All other components within normal limits  COMPREHENSIVE METABOLIC PANEL - Abnormal; Notable for the following components:   Sodium 132 (*)    Chloride 97 (*)    Glucose, Bld 209 (*)    BUN 30 (*)    Creatinine, Ser 1.52 (*)    Calcium 8.6 (*)     Total Bilirubin 1.9 (*)    GFR, Estimated 47 (*)    All other components within normal limits  URINE CULTURE  RESPIRATORY PANEL BY RT PCR (FLU A&B, COVID)  CULTURE, BLOOD (ROUTINE X 2)  CULTURE, BLOOD (ROUTINE X 2)  LACTIC ACID, PLASMA  LACTIC ACID, PLASMA  PROTIME-INR  APTT  TACROLIMUS LEVEL   ____________________________________________  EKG  A. fib with a ventricular rate of 104, significant artifact in leads II and III as well as the lateral leads with some nonspecific ST elevations throughout.  Prolonged QTc interval at 553. ____________________________________________  RADIOLOGY   Official radiology report(s): CT ABDOMEN PELVIS WO CONTRAST  Result Date: 05/19/2020 CLINICAL DATA:  Diffuse abdominal pain for 3 days. Chills. Dysuria. Renal transplant patient. EXAM: CT ABDOMEN AND PELVIS WITHOUT CONTRAST TECHNIQUE: Multidetector CT imaging of the abdomen and pelvis was performed following the standard protocol without IV contrast. COMPARISON:  None. FINDINGS: Lower chest: No acute findings. Hepatobiliary: No mass visualized on this unenhanced exam. Gallbladder is unremarkable. No evidence of biliary ductal dilatation. Pancreas: No mass or inflammatory process visualized on this unenhanced exam. Spleen:  Within normal limits in size. Adrenals/Urinary tract: Diffuse atrophy native kidneys is seen. Renal transplant in the right iliac fossa is unremarkable in appearance. No evidence of perinephric fluid or inflammatory changes. No evidence of hydronephrosis. Unremarkable unopacified urinary bladder. Stomach/Bowel: No evidence of obstruction, inflammatory process, or abnormal fluid collections. Vascular/Lymphatic: No pathologically enlarged lymph nodes identified. No evidence of abdominal aortic aneurysm. Aortic atherosclerotic calcification noted. Reproductive:  No mass or other significant  abnormality. Other:  None. Musculoskeletal: No suspicious bone lesions identified. Internal fixation  hardware noted in the left hip. 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 IMPRESSION: No acute findings within the abdomen or pelvis. Unremarkable appearance of right iliac fossa renal transplant. No evidence of hydronephrosis or perinephric fluid collections. Aortic Atherosclerosis (ICD10-I70.0). Electronically Signed   By: Marlaine Hind M.D.   On: 05/19/2020 13:34   DG Chest 2 View  Result Date: 05/19/2020 CLINICAL DATA:  Fever, immunosuppression EXAM: CHEST - 2 VIEW COMPARISON:  09/20/2017 FINDINGS: Status post median sternotomy and CABG. Unchanged small chronic right pleural effusion and/or pleural thickening. The visualized skeletal structures are unremarkable. IMPRESSION: Unchanged small chronic right pleural effusion and/or pleural thickening. No acute appearing airspace opacity. Electronically Signed   By: Eddie Candle M.D.   On: 05/19/2020 12:42    ____________________________________________   PROCEDURES  Procedure(s) performed (including Critical Care):  .Critical Care Performed by: Lucrezia Starch, MD Authorized by: Lucrezia Starch, MD   Critical care provider statement:    Critical care time (minutes):  45   Critical care time was exclusive of:  Separately billable procedures and treating other patients   Critical care was necessary to treat or prevent imminent or life-threatening deterioration of the following conditions:  Sepsis   Critical care was time spent personally by me on the following activities:  Discussions with consultants, evaluation of patient's response to treatment, examination of patient, ordering and performing treatments and interventions, ordering and review of laboratory studies, ordering and review of radiographic studies, pulse oximetry, re-evaluation of patient's condition, obtaining history from patient or surrogate and review of old  charts     ____________________________________________   INITIAL IMPRESSION / [place] / ED COURSE        Patient presents for assessment of fever associate with dysuria for last 3 or 4 days.  This is in the setting of kidney transplant in 2016 after being on dialysis for ESRD.  On arrival patient is febrile with a temperature of 103 with otherwise stable vital signs on room air although his respiratory rate is 20.  No CVA tenderness or abdominal tenderness on exam.  Differential includes but is not limited to cystitis, pyelonephritis at transplanted kidney site, and other infectious etiologies.  No cough or shortness of breath to suggest pneumonia.  UA does appear infected.  Given elevated white blood cell count and fever with concern for urinary source for infection code sepsis initiated and addition to urine cultures blood cultures were obtained patient was given Rocephin and IV fluids.  Chest x-ray shows chronic pleural effusion without evidence of pneumonia or other no other obvious versus infection on exam.  I did reach out to [facility] where patient receives his transplant care and he was accepted for transfer by Dr. Toy Cookey.  I will plan to transport to [facility] for further evaluation and management.  ____________________________________________   FINAL CLINICAL IMPRESSION(S) / ED DIAGNOSES  Final diagnoses:  Fever, unspecified fever cause  Sepsis, due to unspecified organism, unspecified whether acute organ dysfunction present ([place])  S/P kidney transplant  Immunosuppression ([place])  Anticoagulant long-term use  Atrial fibrillation, unspecified type ([facility])  QT prolongation    Medications  insulin aspart (novoLOG) injection 0-15 Units (has no administration in time range)  lactated ringers infusion ( Intravenous New Bag/Given 05/19/20 1318)  acetaminophen (TYLENOL) tablet 650 mg (650 mg Oral Given 05/19/20 0714)  cefTRIAXone (ROCEPHIN) 1 g in sodium chloride 0.9 % 100 mL  IVPB (1 g Intravenous New Bag/Given 05/19/20 1324)     ED Discharge Orders    None       Note:  This document was prepared using Dragon voice recognition software and may include unintentional dictation errors.   Lucrezia Starch, MD 05/19/20 1402    Lucrezia Starch, MD 05/19/20 [phone]

## 2020-05-19 NOTE — ED Notes (Addendum)
Pt resting at this time.

## 2020-05-19 NOTE — ED Triage Notes (Signed)
C/O burning with urination and chills x 1 day.

## 2020-05-19 NOTE — ED Notes (Signed)
Assumed care of pt at 1900. Pt ate dinner tray, provided with ice water. Updated on plan of care. Side rails up x2, call bell within reach. Denies needs or concerns. AO x4. Talking in full sentences with regular and unlabored breathing. Lights dimmed for comfort.

## 2020-05-19 NOTE — ED Notes (Signed)
Pt back from CT

## 2020-05-19 NOTE — ED Notes (Signed)
3rd lactic sent to lab at this time

## 2020-05-19 NOTE — ED Notes (Signed)
Unable to get PIV at this time. This RN attempted and was unsuccessful. Will have another RN attempt.

## 2020-05-19 NOTE — ED Notes (Signed)
[facility] transfer center called spoke with Cristie Hem

## 2020-05-19 NOTE — ED Notes (Signed)
Pt provided with meal tray.

## 2020-05-19 NOTE — ED Notes (Signed)
Dietary called and will send up pt's meal tray

## 2020-05-19 NOTE — ED Notes (Signed)
Pt not in room at this time. Unable to attempt IV. Will check back shortly. Pharmacy informed of IV situation

## 2020-05-19 NOTE — Sepsis Progress Note (Signed)
LA 1.3>> 2.1.  3rd lactic acid was sent at 1555.Result pending.  Pt is transferring to [facility].

## 2020-05-19 NOTE — ED Notes (Signed)
Antibiotics started per order after blood cultures collected.

## 2020-05-19 NOTE — Progress Notes (Signed)
CODE SEPSIS - PHARMACY COMMUNICATION  **Broad Spectrum Antibiotics should be administered within 1 hour of Sepsis diagnosis**  Time Code Sepsis Called/Page Received: 1215  Antibiotics Ordered:  Ceftriaxone 1g x1  Time of 1st antibiotic administration: 1324  Additional action taken by pharmacy: Notified nurse abx due by 1315  Nurse informed me that patient did not have a line. RN unable to attempt IV as patient was not in room. Another RN to attempt when patient returns.    Sherilyn Banker, PharmD Pharmacy Resident  05/19/2020 12:22 PM

## 2020-05-19 NOTE — ED Triage Notes (Signed)
C/O same symptoms at the end of September.

## 2020-05-19 NOTE — ED Notes (Signed)
Date and time results received: 05/19/20 1420 (use smartphrase ".now" to insert current time)  Test: Lactic Critical Value: 2.1  Name of Provider Notified: Tamala Julian  Orders Received? Or Actions Taken?: Orders Received - See Orders for details

## 2020-05-19 NOTE — ED Notes (Signed)
Pt back from X-ray.  

## 2020-05-19 NOTE — ED Notes (Signed)
Pt given apple sauce, saltine crackers, and peanut butter upon request at this time

## 2020-05-19 NOTE — ED Notes (Addendum)
Pt to X-Ray at this time.

## 2020-05-19 NOTE — ED Notes (Signed)
Called [facility] transfer center for bed status.  Spoke with Jaclyn Shaggy she states we are still waiting on a bed assignment.

## 2020-05-20 ENCOUNTER — Emergency Department: Payer: Medicare Other

## 2020-05-20 ENCOUNTER — Inpatient Hospital Stay: Admit: 2020-05-20 | Payer: Medicare Other

## 2020-05-20 ENCOUNTER — Inpatient Hospital Stay (HOSPITAL_COMMUNITY)
Admit: 2020-05-20 | Discharge: 2020-05-20 | Disposition: A | Discharge status: Home / Self Care | Payer: Medicare Other | Attending: Internal Medicine | Admitting: Internal Medicine

## 2020-05-20 DIAGNOSIS — I129 Hypertensive chronic kidney disease with stage 1 through stage 4 chronic kidney disease, or unspecified chronic kidney disease: Secondary | ICD-10-CM | POA: Diagnosis present

## 2020-05-20 DIAGNOSIS — N400 Enlarged prostate without lower urinary tract symptoms: Secondary | ICD-10-CM | POA: Diagnosis present

## 2020-05-20 DIAGNOSIS — E1159 Type 2 diabetes mellitus with other circulatory complications: Secondary | ICD-10-CM

## 2020-05-20 DIAGNOSIS — E1122 Type 2 diabetes mellitus with diabetic chronic kidney disease: Secondary | ICD-10-CM | POA: Diagnosis present

## 2020-05-20 DIAGNOSIS — A4151 Sepsis due to Escherichia coli [E. coli]: Secondary | ICD-10-CM | POA: Diagnosis present

## 2020-05-20 DIAGNOSIS — I5021 Acute systolic (congestive) heart failure: Secondary | ICD-10-CM | POA: Diagnosis not present

## 2020-05-20 DIAGNOSIS — T8619 Other complication of kidney transplant: Secondary | ICD-10-CM | POA: Diagnosis present

## 2020-05-20 DIAGNOSIS — K219 Gastro-esophageal reflux disease without esophagitis: Secondary | ICD-10-CM | POA: Diagnosis present

## 2020-05-20 DIAGNOSIS — Z79899 Other long term (current) drug therapy: Secondary | ICD-10-CM | POA: Diagnosis not present

## 2020-05-20 DIAGNOSIS — A419 Sepsis, unspecified organism: Secondary | ICD-10-CM | POA: Diagnosis present

## 2020-05-20 DIAGNOSIS — Z7982 Long term (current) use of aspirin: Secondary | ICD-10-CM | POA: Diagnosis not present

## 2020-05-20 DIAGNOSIS — I25810 Atherosclerosis of coronary artery bypass graft(s) without angina pectoris: Secondary | ICD-10-CM | POA: Diagnosis not present

## 2020-05-20 DIAGNOSIS — Z794 Long term (current) use of insulin: Secondary | ICD-10-CM | POA: Diagnosis not present

## 2020-05-20 DIAGNOSIS — I2510 Atherosclerotic heart disease of native coronary artery without angina pectoris: Secondary | ICD-10-CM | POA: Diagnosis present

## 2020-05-20 DIAGNOSIS — N179 Acute kidney failure, unspecified: Secondary | ICD-10-CM | POA: Diagnosis present

## 2020-05-20 DIAGNOSIS — I480 Paroxysmal atrial fibrillation: Secondary | ICD-10-CM | POA: Diagnosis present

## 2020-05-20 DIAGNOSIS — I252 Old myocardial infarction: Secondary | ICD-10-CM | POA: Diagnosis not present

## 2020-05-20 DIAGNOSIS — Z20822 Contact with and (suspected) exposure to covid-19: Secondary | ICD-10-CM | POA: Diagnosis present

## 2020-05-20 DIAGNOSIS — Y830 Surgical operation with transplant of whole organ as the cause of abnormal reaction of the patient, or of later complication, without mention of misadventure at the time of the procedure: Secondary | ICD-10-CM | POA: Diagnosis present

## 2020-05-20 DIAGNOSIS — Z955 Presence of coronary angioplasty implant and graft: Secondary | ICD-10-CM | POA: Diagnosis not present

## 2020-05-20 DIAGNOSIS — Z87440 Personal history of urinary (tract) infections: Secondary | ICD-10-CM | POA: Diagnosis not present

## 2020-05-20 DIAGNOSIS — Z951 Presence of aortocoronary bypass graft: Secondary | ICD-10-CM | POA: Diagnosis not present

## 2020-05-20 DIAGNOSIS — N183 Chronic kidney disease, stage 3 unspecified: Secondary | ICD-10-CM | POA: Diagnosis present

## 2020-05-20 DIAGNOSIS — E871 Hypo-osmolality and hyponatremia: Secondary | ICD-10-CM | POA: Diagnosis present

## 2020-05-20 DIAGNOSIS — I4892 Unspecified atrial flutter: Secondary | ICD-10-CM | POA: Diagnosis present

## 2020-05-20 DIAGNOSIS — I493 Ventricular premature depolarization: Secondary | ICD-10-CM | POA: Diagnosis present

## 2020-05-20 DIAGNOSIS — N390 Urinary tract infection, site not specified: Secondary | ICD-10-CM

## 2020-05-20 DIAGNOSIS — E876 Hypokalemia: Secondary | ICD-10-CM | POA: Diagnosis present

## 2020-05-20 DIAGNOSIS — Z7901 Long term (current) use of anticoagulants: Secondary | ICD-10-CM | POA: Diagnosis not present

## 2020-05-20 LAB — CBC
HCT: 34.2 % — ABNORMAL LOW (ref 39.0–52.0)
Hemoglobin: 11.8 g/dL — ABNORMAL LOW (ref 13.0–17.0)
MCH: 30 pg (ref 26.0–34.0)
MCHC: 34.5 g/dL (ref 30.0–36.0)
MCV: 87 fL (ref 80.0–100.0)
Platelets: 151 10*3/uL (ref 150–400)
RBC: 3.93 MIL/uL — ABNORMAL LOW (ref 4.22–5.81)
RDW: 12.9 % (ref 11.5–15.5)
WBC: 12.6 10*3/uL — ABNORMAL HIGH (ref 4.0–10.5)
nRBC: 0 % (ref 0.0–0.2)

## 2020-05-20 LAB — BASIC METABOLIC PANEL
Anion gap: 10 (ref 5–15)
BUN: 25 mg/dL — ABNORMAL HIGH (ref 8–23)
CO2: 23 mmol/L (ref 22–32)
Calcium: 8 mg/dL — ABNORMAL LOW (ref 8.9–10.3)
Chloride: 97 mmol/L — ABNORMAL LOW (ref 98–111)
Creatinine, Ser: 1.25 mg/dL — ABNORMAL HIGH (ref 0.61–1.24)
GFR, Estimated: >60 mL/min (ref >60)
Glucose, Bld: 143 mg/dL — ABNORMAL HIGH (ref 70–99)
Potassium: 3.3 mmol/L — ABNORMAL LOW (ref 3.5–5.1)
Sodium: 130 mmol/L — ABNORMAL LOW (ref 135–145)

## 2020-05-20 LAB — ECHOCARDIOGRAM COMPLETE
AR max vel: 2.66 cm2
AV Area VTI: 3.72 cm2
AV Area mean vel: 2.81 cm2
AV Mean grad: 1 mmHg
AV Peak grad: 2.8 mmHg
Ao pk vel: 0.83 m/s
Area-P 1/2: 4.29 cm2
Calc EF: 45.7 %
Height: 68 in
S' Lateral: 3.34 cm
Single Plane A2C EF: 48.6 %
Single Plane A4C EF: 47.7 %
Weight: 2592.61 oz

## 2020-05-20 LAB — GLUCOSE, CAPILLARY
Glucose-Capillary: 192 mg/dL — ABNORMAL HIGH (ref 70–99)
Glucose-Capillary: 187 mg/dL — ABNORMAL HIGH (ref 70–99)
Glucose-Capillary: 124 mg/dL — ABNORMAL HIGH (ref 70–99)
Glucose-Capillary: 169 mg/dL — ABNORMAL HIGH (ref 70–99)
Glucose-Capillary: 251 mg/dL — ABNORMAL HIGH (ref 70–99)
Glucose-Capillary: 164 mg/dL — ABNORMAL HIGH (ref 70–99)

## 2020-05-20 LAB — LACTIC ACID, PLASMA
Lactic Acid, Venous: 3.5 mmol/L (ref 0.5–1.9)
Lactic Acid, Venous: 4.5 mmol/L (ref 0.5–1.9)
Lactic Acid, Venous: 1.2 mmol/L (ref 0.5–1.9)
Lactic Acid, Venous: 1.4 mmol/L (ref 0.5–1.9)

## 2020-05-20 LAB — TROPONIN I (HIGH SENSITIVITY)
Troponin I (High Sensitivity): 58 ng/L — ABNORMAL HIGH (ref <18)
Troponin I (High Sensitivity): 56 ng/L — ABNORMAL HIGH (ref <18)

## 2020-05-20 MED ORDER — SODIUM CHLORIDE 0.9 % IV SOLN: INTRAVENOUS | Status: DC

## 2020-05-20 MED ORDER — SODIUM CHLORIDE 0.9 % IV BOLUS
1000.0000 mL | Freq: Once | INTRAVENOUS | Status: AC
  Administered 2020-05-20: 1000 mL via INTRAVENOUS

## 2020-05-20 MED ORDER — TAMSULOSIN HCL 0.4 MG PO CAPS
0.4000 mg | ORAL_CAPSULE | Freq: Two times a day (BID) | ORAL | Status: DC
  Administered 2020-05-20 – 2020-05-21 (×3): 0.4 mg via ORAL
  Filled 2020-05-20 (×3): qty 1

## 2020-05-20 MED ORDER — ONDANSETRON HCL 4 MG PO TABS
4.0000 mg | ORAL_TABLET | Freq: Four times a day (QID) | ORAL | Status: DC | PRN
  Administered 2020-05-20: 4 mg via ORAL
  Filled 2020-05-20: qty 1

## 2020-05-20 MED ORDER — AMLODIPINE BESYLATE 5 MG PO TABS: 10.0000 mg | ORAL_TABLET | Freq: Every day | ORAL | Status: DC

## 2020-05-20 MED ORDER — TACROLIMUS 1 MG PO CAPS: 1.0000 mg | ORAL_CAPSULE | Freq: Two times a day (BID) | ORAL | Status: DC

## 2020-05-20 MED ORDER — INSULIN ASPART 100 UNIT/ML ~~LOC~~ SOLN
0.0000 [IU] | Freq: Three times a day (TID) | SUBCUTANEOUS | Status: DC
  Administered 2020-05-20: 8 [IU] via SUBCUTANEOUS
  Administered 2020-05-21: 5 [IU] via SUBCUTANEOUS
  Administered 2020-05-21: 8 [IU] via SUBCUTANEOUS
  Administered 2020-05-21: 5 [IU] via SUBCUTANEOUS
  Administered 2020-05-22: 08:00:00 15 [IU] via SUBCUTANEOUS
  Administered 2020-05-22: 12:00:00 8 [IU] via SUBCUTANEOUS
  Filled 2020-05-20 (×6): qty 1

## 2020-05-20 MED ORDER — ACETAMINOPHEN 325 MG PO TABS
650.0000 mg | ORAL_TABLET | Freq: Once | ORAL | Status: AC
  Administered 2020-05-20: 650 mg via ORAL
  Filled 2020-05-20: qty 2

## 2020-05-20 MED ORDER — MYCOPHENOLATE MOFETIL 250 MG PO CAPS
750.0000 mg | ORAL_CAPSULE | Freq: Two times a day (BID) | ORAL | Status: DC
  Administered 2020-05-20 – 2020-05-22 (×4): 750 mg via ORAL
  Filled 2020-05-20 (×6): qty 3

## 2020-05-20 MED ORDER — CARVEDILOL 6.25 MG PO TABS: 12.5000 mg | ORAL_TABLET | Freq: Every day | ORAL | Status: DC

## 2020-05-20 MED ORDER — VITAMIN D3 25 MCG (1000 UNIT) PO TABS
2000.0000 [IU] | ORAL_TABLET | Freq: Every day | ORAL | Status: DC
  Administered 2020-05-20 – 2020-05-22 (×3): 2000 [IU] via ORAL
  Filled 2020-05-20 (×6): qty 2

## 2020-05-20 MED ORDER — MYCOPHENOLATE MOFETIL 250 MG PO CAPS: 750.0000 mg | ORAL_CAPSULE | Freq: Two times a day (BID) | ORAL | Status: DC

## 2020-05-20 MED ORDER — ACETAMINOPHEN 650 MG RE SUPP: 650.0000 mg | Freq: Four times a day (QID) | RECTAL | Status: DC | PRN

## 2020-05-20 MED ORDER — AMLODIPINE BESYLATE 10 MG PO TABS
10.0000 mg | ORAL_TABLET | Freq: Every day | ORAL | Status: DC
  Administered 2020-05-20 – 2020-05-22 (×3): 10 mg via ORAL
  Filled 2020-05-20: qty 1
  Filled 2020-05-20: qty 2
  Filled 2020-05-20: qty 1

## 2020-05-20 MED ORDER — ONDANSETRON HCL 4 MG/2ML IJ SOLN
4.0000 mg | Freq: Once | INTRAMUSCULAR | Status: AC
  Administered 2020-05-20: 4 mg via INTRAVENOUS
  Filled 2020-05-20: qty 2

## 2020-05-20 MED ORDER — TACROLIMUS 1 MG PO CAPS
1.0000 mg | ORAL_CAPSULE | Freq: Two times a day (BID) | ORAL | Status: DC
  Administered 2020-05-20 – 2020-05-22 (×4): 1 mg via ORAL
  Filled 2020-05-20 (×6): qty 1

## 2020-05-20 MED ORDER — ATORVASTATIN CALCIUM 20 MG PO TABS
40.0000 mg | ORAL_TABLET | Freq: Every day | ORAL | Status: DC
  Administered 2020-05-20 – 2020-05-21 (×2): 40 mg via ORAL
  Filled 2020-05-20 (×2): qty 2

## 2020-05-20 MED ORDER — ONDANSETRON HCL 4 MG/2ML IJ SOLN: 4.0000 mg | Freq: Four times a day (QID) | INTRAMUSCULAR | Status: DC | PRN

## 2020-05-20 MED ORDER — LACTATED RINGERS IV BOLUS
1000.0000 mL | Freq: Once | INTRAVENOUS | Status: AC
  Administered 2020-05-20: 1000 mL via INTRAVENOUS

## 2020-05-20 MED ORDER — FUROSEMIDE 10 MG/ML IJ SOLN
40.0000 mg | Freq: Once | INTRAMUSCULAR | Status: AC
  Administered 2020-05-20: 40 mg via INTRAVENOUS
  Filled 2020-05-20: qty 4

## 2020-05-20 MED ORDER — ASPIRIN EC 81 MG PO TBEC
81.0000 mg | DELAYED_RELEASE_TABLET | Freq: Every day | ORAL | Status: DC
  Administered 2020-05-20 – 2020-05-22 (×3): 81 mg via ORAL
  Filled 2020-05-20 (×3): qty 1

## 2020-05-20 MED ORDER — SODIUM CHLORIDE 0.9 % IV SOLN
2.0000 g | Freq: Two times a day (BID) | INTRAVENOUS | Status: DC
  Administered 2020-05-20 (×2): 2 g via INTRAVENOUS
  Filled 2020-05-20 (×4): qty 2

## 2020-05-20 MED ORDER — SODIUM CHLORIDE 0.9 % IV SOLN: 2.0000 g | Freq: Once | INTRAVENOUS | Status: DC

## 2020-05-20 MED ORDER — POTASSIUM CHLORIDE CRYS ER 20 MEQ PO TBCR
20.0000 meq | EXTENDED_RELEASE_TABLET | Freq: Every day | ORAL | Status: DC
  Administered 2020-05-21: 09:00:00 20 meq via ORAL
  Filled 2020-05-20: qty 1

## 2020-05-20 MED ORDER — ACETAMINOPHEN 325 MG PO TABS: 650.0000 mg | ORAL_TABLET | Freq: Four times a day (QID) | ORAL | Status: DC | PRN

## 2020-05-20 MED ORDER — POTASSIUM CHLORIDE CRYS ER 20 MEQ PO TBCR
40.0000 meq | EXTENDED_RELEASE_TABLET | Freq: Once | ORAL | Status: AC
  Administered 2020-05-20: 40 meq via ORAL
  Filled 2020-05-20: qty 2

## 2020-05-20 MED ORDER — SODIUM CHLORIDE 0.9 % IV SOLN
1.0000 g | INTRAVENOUS | Status: DC
  Administered 2020-05-20: 1 g via INTRAVENOUS
  Filled 2020-05-20: qty 10

## 2020-05-20 MED ORDER — PHENAZOPYRIDINE HCL 200 MG PO TABS
200.0000 mg | ORAL_TABLET | Freq: Three times a day (TID) | ORAL | Status: DC
  Administered 2020-05-20 – 2020-05-22 (×7): 200 mg via ORAL
  Filled 2020-05-20 (×8): qty 1

## 2020-05-20 NOTE — ED Notes (Signed)
This RN at bedside. Pt oxygen sats 85-86%. Pt sleeping. Pt woken up and oxygen sats increased to 92-93%. Pt primary RN made aware.

## 2020-05-20 NOTE — Consult Note (Signed)
[address] [place], [place] [address] Phone [phone]. Fax [phone]  Date: 05/20/2020                  Patient Name:  [name]  MRN: [id number]  DOB: November 19, 1955  Age / Sex: 64 y.o., male         PCP: Lianne Bushy, MD                 Service Requesting Consult: IM/ Collier Bullock, MD                 Reason for Consult: Renal transplant status            History of Present Illness: Patient is a 64 y.o. male  ad[facility] on [facility]19/2020    Patient presents to the emergency room for dysuria and chills for about a day or so.  He reports his appetite has been poor for the past 2 days.  He has recent history of admission to the hospital from September 26 to September 28 for similar symptoms and had a day urinary tract infection with E. coli at that time.  Patient reports compliance with antibiotic therapy post discharge.  He comes in again with lower abdominal/suprapubic discomfort.  In the ER he was febrile, tachycardic.  CT of the abdomen pelvis is negative for stone.  Review of records in care everywhere show that his baseline creatinine is 1.2-1.4.  He is followed at [facility] transplant clinic.  Admission creatinine this time was 1.52 which has improved to 1.25 today with hydration.  Nephrology consult has now been requested for further evaluation.   Medications: Outpatient medications: (Not in a hospital admission)   Current medications: Current Facility-Administered Medications  Medication Dose Route Frequency Provider Last Rate Last Admin  . amLODipine (NORVASC) tablet 10 mg  10 mg Oral Daily Agbata, Tochukwu, MD   10 mg at 05/20/20 1329  . apixaban (ELIQUIS) tablet 5 mg  5 mg Oral BID Lucrezia Starch, MD   5 mg at 05/20/20 2620  . aspirin EC tablet 81 mg  81 mg Oral Q0600 Agbata, Tochukwu, MD   81 mg at 05/20/20 1328  . atorvastatin (LIPITOR) tablet 40 mg  40 mg Oral q1800 Agbata, Tochukwu, MD      . carvedilol (COREG) tablet 3.125 mg  3.125  mg Oral BID WC Lucrezia Starch, MD   3.125 mg at 05/20/20 0904  . ceFEPIme (MAXIPIME) 2 g in sodium chloride 0.9 % 100 mL IVPB  2 g Intravenous Q12H Delena Bali, Student-PharmD   Stopped at 05/20/20 1514  . cholecalciferol (VITAMIN D) tablet 2,000 Units  2,000 Units Oral Daily Agbata, Tochukwu, MD   2,000 Units at 05/20/20 1330  . insulin aspart (novoLOG) injection 0-15 Units  0-15 Units Subcutaneous TID WC Agbata, Tochukwu, MD      . mycophenolate (CELLCEPT) capsule 750 mg  750 mg Oral BID Agbata, Tochukwu, MD   750 mg at 05/20/20 1158  . ondansetron (ZOFRAN) tablet 4 mg  4 mg Oral Q6H PRN Agbata, Tochukwu, MD       Or  . ondansetron (ZOFRAN) injection 4 mg  4 mg Intravenous Q6H PRN Agbata, Tochukwu, MD      . phenazopyridine (PYRIDIUM) tablet 200 mg  200 mg Oral TID WC Agbata, Tochukwu, MD   200 mg at 05/20/20 1156  . tacrolimus (PROGRAF) capsule 1 mg  1 mg Oral BID Agbata, Tochukwu, MD      .  tamsulosin (FLOMAX) capsule 0.4 mg  0.4 mg Oral BID Agbata, Tochukwu, MD   0.4 mg at 05/20/20 1329   Current Outpatient Medications  Medication Sig Dispense Refill  . ascorbic acid ([employer]S VITAMIN C) 500 MG tablet Take 500 mg by mouth daily.    . chlorthalidone (HYGROTON) 25 MG tablet Take 12.5 mg by mouth daily.    . phenazopyridine (PYRIDIUM) 200 MG tablet Take 1 tablet (200 mg total) by mouth 3 (three) times daily with meals. 21 tablet 0  . amLODipine (NORVASC) 10 MG tablet Take 10 mg by mouth daily.     [name] aspirin EC 81 MG EC tablet Take 1 tablet (81 mg total) by mouth daily at 6 (six) AM.    . atorvastatin (LIPITOR) 40 MG tablet 40 mg daily at 6 PM.     . carvedilol (COREG) 12.5 MG tablet Take 12.5 mg by mouth at bedtime.    . cefdinir (OMNICEF) 300 MG capsule Take 300 mg by mouth 2 (two) times daily.    . Cholecalciferol (VITAMIN D3) 50 MCG (2000 UT) capsule Take 2,000 Units by mouth daily.     [name] ELIQUIS 5 MG TABS tablet Take 5 mg by mouth 2 (two) times daily.     [name] HUMALOG KWIKPEN 100  UNIT/ML KiwkPen Inject 10-12 Units into the skin 3 (three) times daily. Inject 10u daily at breakfast, 10u at lunch and up to 12u at supper per SSI    . Insulin Pen Needle (ADVOCATE INSULIN PEN NEEDLES) 33G X 4 MM MISC To use with insulin pen 4 times per day. E 13.9    . LANTUS SOLOSTAR 100 UNIT/ML Solostar Pen Inject 24 Units into the skin at bedtime.     [name] linagliptin (TRADJENTA) 5 MG TABS tablet Take 5 mg by mouth daily.     [name] losartan (COZAAR) 25 MG tablet Take 25 mg by mouth daily.    . mycophenolate (CELLCEPT) 250 MG capsule Take 750 mg by mouth 2 (two) times daily.     [name] omeprazole (PRILOSEC) 20 MG capsule Take 20 mg by mouth daily as needed.     [name] PROGRAF 1 MG capsule Take 1 mg by mouth 2 (two) times daily.     . tamsulosin (FLOMAX) 0.4 MG CAPS capsule Take 0.4 mg by mouth 2 (two) times daily.         Allergies: No Known Allergies    Past Medical History: Past Medical History:  Diagnosis Date  . Arthritis   . Chronic kidney disease    KIDNEY TRANSPLANT  . Coronary artery disease   . Diabetes mellitus without complication ([place])   . Dyspnea   . GERD (gastroesophageal reflux disease)   . History of blood transfusion   . Hypertension   . Myocardial infarction [facility])      Past Surgical History: Past Surgical History:  Procedure Laterality Date  . CATARACT EXTRACTION W/PHACO Right 02/03/2017   Procedure: CATARACT EXTRACTION PHACO AND INTRAOCULAR LENS PLACEMENT (IOC);  Surgeon: Leandrew Koyanagi, MD;  Location: [facility] ORS;  Service: Ophthalmology;  Laterality: Right;  [place] 00:35.8 AP% 12.8 CDE 4.57 Fluid lot # [id number]  . CORONARY ANGIOPLASTY     STENTS X 4  . CORONARY ARTERY BYPASS GRAFT    . ENDARTERECTOMY Left 09/06/2019   Procedure: ENDARTERECTOMY CAROTID;  Surgeon: Algernon Huxley, MD;  Location: [facility] ORS;  Service: Vascular;  Laterality: Left;  . EYE SURGERY Bilateral    cataract  . HIP SURGERY    .  KIDNEY TRANSPLANT     2016     Family History: Family History   Problem Relation Age of Onset  . Drug abuse Sister   . Drug abuse Brother      Social History: Social History   Socioeconomic History  . Marital status: Married    Spouse name: Not on file  . Number of children: Not on file  . Years of education: Not on file  . Highest education level: Not on file  Occupational History  . Not on file  Tobacco Use  . Smoking status: Former Research scientist (life sciences)  . Smokeless tobacco: Never Used  . Tobacco comment: 30 years ago  Vaping Use  . Vaping Use: Never used  Substance and Sexual Activity  . Alcohol use: No    Alcohol/week: 0.0 standard drinks  . Drug use: Never  . Sexual activity: Not on file  Other Topics Concern  . Not on file  Social History Narrative  . Not on file   Social Determinants of Health   Financial Resource Strain:   . Difficulty of Paying Living Expenses: Not on file  Food Insecurity:   . Worried About Charity fundraiser in the Last Year: Not on file  . Ran Out of Food in the Last Year: Not on file  Transportation Needs:   . Lack of Transportation (Medical): Not on file  . Lack of Transportation (Non-Medical): Not on file  Physical Activity:   . Days of Exercise per Week: Not on file  . Minutes of Exercise per Session: Not on file  Stress:   . Feeling of Stress : Not on file  Social Connections:   . Frequency of Communication with Friends and Family: Not on file  . Frequency of Social Gatherings with Friends and Family: Not on file  . Attends Religious Services: Not on file  . Active Member of Clubs or Organizations: Not on file  . Attends Archivist Meetings: Not on file  . Marital Status: Not on file  Intimate Partner Violence:   . Fear of Current or Ex-Partner: Not on file  . Emotionally Abused: Not on file  . Physically Abused: Not on file  . Sexually Abused: Not on file     Review of Systems: Gen: Febrile as described above HEENT: Denies any vision or hearing complaints CV: No chest pain or  shortness of breath Resp: No cough or sputum production.  Wearing oxygen in the ER GI: No nausea or vomiting.  Decreased appetite for the past 2 days GU : Dysuria, suprapubic discomfort MS: No acute joint pains or effusions Derm:    No acute rashes Psych: No complaints Heme: No complaints Neuro: No complaints endocrine.  No complaints  Vital Signs: Blood pressure 121/64, pulse 89, temperature 99.1 F (37.3 C), temperature source Oral, resp. rate (!) 25, height 5\' 8"  (1.727 m), weight 73.5 kg, SpO2 97 %.   Intake/Output Summary (Last 24 hours) at 05/20/2020 1612 Last data filed at 05/20/2020 0725 Gross per 24 hour  Intake --  Output 800 ml  Net -800 ml    Weight trends: Filed Weights   05/19/20 0645 05/19/20 0705  Weight: 73.5 kg 73.5 kg   Physical Exam: General:  No acute distress, laying in the bed  HEENT  anicteric, moist oral mucous membrane  Pulm/lungs  normal breathing effort, lungs are clear to auscultation  CVS/Heart  regular rhythm, no rub or gallop  Abdomen:   Soft, nontender  Extremities:  No peripheral  edema  Neurologic:  Alert, oriented, able to follow commands  Skin:  No acute rashes      Lab results: Basic Metabolic Panel: Recent Labs  Lab 05/19/20 0709 05/20/20 0716  NA 132* 130*  K 3.9 3.3*  CL 97* 97*  CO2 22 23  GLUCOSE 209* 143*  BUN 30* 25*  CREATININE 1.52* 1.25*  CALCIUM 8.6* 8.0*    Liver Function Tests: Recent Labs  Lab 05/19/20 0709  AST 22  ALT 20  ALKPHOS 44  BILITOT 1.9*  PROT 7.3  ALBUMIN 4.0   No results for input(s): LIPASE, AMYLASE in the last 168 hours. No results for input(s): AMMONIA in the last 168 hours.  CBC: Recent Labs  Lab 05/19/20 0709 05/20/20 0716  WBC 15.4* 12.6*  HGB 13.5 11.8*  HCT 38.9* 34.2*  MCV 86.6 87.0  PLT 218 151    Cardiac Enzymes: No results for input(s): CKTOTAL, TROPONINI in the last 168 hours.  BNP: Invalid input(s): POCBNP  CBG: Recent Labs  Lab 05/19/20 1946  05/20/20 0116 05/20/20 0416 05/20/20 0827 05/20/20 1335  GLUCAP 222* 192* 187* 124* 169*    Microbiology: Recent Results (from the past 720 hour(s))  Urine Culture     Status: Abnormal   Collection Time: 04/26/20  9:56 PM   Specimen: Urine, Random  Result Value Ref Range Status   Specimen Description   Final    URINE, RANDOM Performed at [facility], [address] [place], [place] [address]    Special Requests   Final    NONE Performed at [facility], [address] [place], [place] [address]    Culture >=100,000 COLONIES/mL ESCHERICHIA COLI (A)  Final   Report Status 04/29/2020 FINAL  Final   Organism ID, Bacteria ESCHERICHIA COLI (A)  Final      Susceptibility   Escherichia coli - MIC*    AMPICILLIN <=2 SENSITIVE Sensitive     CEFAZOLIN <=4 SENSITIVE Sensitive     CEFTRIAXONE <=0.25 SENSITIVE Sensitive     CIPROFLOXACIN <=0.25 SENSITIVE Sensitive     GENTAMICIN <=1 SENSITIVE Sensitive     IMIPENEM <=0.25 SENSITIVE Sensitive     NITROFURANTOIN <=16 SENSITIVE Sensitive     TRIMETH/SULFA <=20 SENSITIVE Sensitive     AMPICILLIN/SULBACTAM <=2 SENSITIVE Sensitive     PIP/TAZO <=4 SENSITIVE Sensitive     * >=100,000 COLONIES/mL ESCHERICHIA COLI  Culture, blood (routine x 2)     Status: None   Collection Time: 04/27/20  2:12 AM   Specimen: BLOOD  Result Value Ref Range Status   Specimen Description BLOOD BLOOD RIGHT HAND  Final   Special Requests   Final    BOTTLES DRAWN AEROBIC AND ANAEROBIC Blood Culture adequate volume   Culture   Final    NO GROWTH 5 DAYS Performed at [facility], [address] [place], [place] [address]    Report Status 05/02/2020 FINAL  Final  Culture, blood (routine x 2)     Status: None   Collection Time: 04/27/20  2:29 AM   Specimen: BLOOD  Result Value Ref Range Status   Specimen Description BLOOD RT HAND  Final   Special Requests   Final    BOTTLES DRAWN AEROBIC AND ANAEROBIC Blood Culture adequate  volume   Culture   Final    NO GROWTH 5 DAYS Performed at [facility], [address], [place], [place] [address]    Report Status 05/02/2020 FINAL  Final  Respiratory Panel by  RT PCR (Flu A&B, Covid) - Nasopharyngeal Swab     Status: None   Collection Time: 04/27/20  3:49 AM   Specimen: Nasopharyngeal Swab  Result Value Ref Range Status   SARS Coronavirus 2 by RT PCR NEGATIVE NEGATIVE Final    Comment: (NOTE) SARS-CoV-2 target nucleic acids are NOT DETECTED.  The SARS-CoV-2 RNA is generally detectable in upper respiratoy specimens during the acute phase of infection. The lowest concentration of SARS-CoV-2 viral copies this assay can detect is 131 copies/mL. A negative result does not preclude SARS-Cov-2 infection and should not be used as the sole basis for treatment or other patient management decisions. A negative result may occur with  improper specimen collection/handling, submission of specimen other than nasopharyngeal swab, presence of viral mutation(s) within the areas targeted by this assay, and inadequate number of viral copies (<131 copies/mL). A negative result must be combined with clinical observations, patient history, and epidemiological information. The expected result is Negative.  Fact Sheet for Patients:  PinkCheek.be  Fact Sheet for Healthcare Providers:  GravelBags.it  This test is no t yet approved or cleared by the [place] FDA and  has been authorized for detection and/or diagnosis of SARS-CoV-2 by FDA under an Emergency Use Authorization (EUA). This EUA will remain  in effect (meaning this test can be used) for the duration of the COVID-19 declaration under Section 564(b)(1) of the Act, 21 U.S.C. section 360bbb-3(b)(1), unless the authorization is terminated or revoked sooner.     Influenza A by PCR NEGATIVE NEGATIVE Final   Influenza B by PCR NEGATIVE NEGATIVE Final     Comment: (NOTE) The Xpert Xpress SARS-CoV-2/FLU/RSV assay is intended as an aid in  the diagnosis of influenza from Nasopharyngeal swab specimens and  should not be used as a sole basis for treatment. Nasal washings and  aspirates are unacceptable for Xpert Xpress SARS-CoV-2/FLU/RSV  testing.  Fact Sheet for Patients: PinkCheek.be  Fact Sheet for Healthcare Providers: GravelBags.it  This test is not yet approved or cleared by the [place] FDA and  has been authorized for detection and/or diagnosis of SARS-CoV-2 by  FDA under an Emergency Use Authorization (EUA). This EUA will remain  in effect (meaning this test can be used) for the duration of the  Covid-19 declaration under Section 564(b)(1) of the Act, 21  U.S.C. section 360bbb-3(b)(1), unless the authorization is  terminated or revoked. Performed at [facility], [address], [place], [place] [address]   Urine Culture     Status: Abnormal (Preliminary result)   Collection Time: 05/19/20  6:46 AM   Specimen: Urine, Random  Result Value Ref Range Status   Specimen Description   Final    URINE, RANDOM Performed at [facility], [address] [place], [place] [address]    Special Requests   Final    NONE Performed at [facility], [address] [place], [place] [address]    Culture (A)  Final    >=100,000 COLONIES/mL ESCHERICHIA COLI SUSCEPTIBILITIES TO FOLLOW Performed at [facility], [place] [address], [place], [place] [address]    Report Status PENDING  Incomplete  Blood culture (routine x 2)     Status: None (Preliminary result)   Collection Time: 05/19/20 12:19 PM   Specimen: BLOOD  Result Value Ref Range Status   Specimen Description BLOOD BLOOD LEFT HAND  Final   Special Requests   Final    BOTTLES DRAWN AEROBIC AND ANAEROBIC Blood Culture results may  not be optimal due to an inadequate volume of blood received  in culture bottles   Culture   Final    NO GROWTH < 24 HOURS Performed at [facility], [address] [place], [place] [address]    Report Status PENDING  Incomplete  Respiratory Panel by RT PCR (Flu A&B, Covid) - Nasopharyngeal Swab     Status: None   Collection Time: 05/19/20  1:29 PM   Specimen: Nasopharyngeal Swab  Result Value Ref Range Status   SARS Coronavirus 2 by RT PCR NEGATIVE NEGATIVE Final    Comment: (NOTE) SARS-CoV-2 target nucleic acids are NOT DETECTED.  The SARS-CoV-2 RNA is generally detectable in upper respiratoy specimens during the acute phase of infection. The lowest concentration of SARS-CoV-2 viral copies this assay can detect is 131 copies/mL. A negative result does not preclude SARS-Cov-2 infection and should not be used as the sole basis for treatment or other patient management decisions. A negative result may occur with  improper specimen collection/handling, submission of specimen other than nasopharyngeal swab, presence of viral mutation(s) within the areas targeted by this assay, and inadequate number of viral copies (<131 copies/mL). A negative result must be combined with clinical observations, patient history, and epidemiological information. The expected result is Negative.  Fact Sheet for Patients:  PinkCheek.be  Fact Sheet for Healthcare Providers:  GravelBags.it  This test is no t yet approved or cleared by the [place] FDA and  has been authorized for detection and/or diagnosis of SARS-CoV-2 by FDA under an Emergency Use Authorization (EUA). This EUA will remain  in effect (meaning this test can be used) for the duration of the COVID-19 declaration under Section 564(b)(1) of the Act, 21 U.S.C. section 360bbb-3(b)(1), unless the authorization is terminated or revoked sooner.     Influenza A by PCR NEGATIVE NEGATIVE Final   Influenza B by PCR NEGATIVE NEGATIVE  Final    Comment: (NOTE) The Xpert Xpress SARS-CoV-2/FLU/RSV assay is intended as an aid in  the diagnosis of influenza from Nasopharyngeal swab specimens and  should not be used as a sole basis for treatment. Nasal washings and  aspirates are unacceptable for Xpert Xpress SARS-CoV-2/FLU/RSV  testing.  Fact Sheet for Patients: PinkCheek.be  Fact Sheet for Healthcare Providers: GravelBags.it  This test is not yet approved or cleared by the [place] FDA and  has been authorized for detection and/or diagnosis of SARS-CoV-2 by  FDA under an Emergency Use Authorization (EUA). This EUA will remain  in effect (meaning this test can be used) for the duration of the  Covid-19 declaration under Section 564(b)(1) of the Act, 21  U.S.C. section 360bbb-3(b)(1), unless the authorization is  terminated or revoked. Performed at [facility], [address] [place], [place] [address]   Blood culture (routine x 2)     Status: None (Preliminary result)   Collection Time: 05/19/20  1:29 PM   Specimen: BLOOD  Result Value Ref Range Status   Specimen Description BLOOD BLOOD RIGHT HAND  Final   Special Requests   Final    BOTTLES DRAWN AEROBIC AND ANAEROBIC Blood Culture adequate volume   Culture   Final    NO GROWTH < 24 HOURS Performed at [facility], [address] [place], [place] [address]    Report Status PENDING  Incomplete     Coagulation Studies: Recent Labs    05/19/20 1329  LABPROT 16.5*  INR 1.4*    Urinalysis: Recent Labs  05/19/20 0709  COLORURINE AMBER*  LABSPEC 1.025  PHURINE 5.0  GLUCOSEU NEGATIVE  HGBUR MODERATE*  BILIRUBINUR NEGATIVE  KETONESUR NEGATIVE  PROTEINUR 30*  NITRITE NEGATIVE  LEUKOCYTESUR MODERATE*        Imaging: CT ABDOMEN PELVIS WO CONTRAST  Result Date: 05/19/2020 CLINICAL DATA:  Diffuse abdominal pain for 3 days. Chills. Dysuria. Renal transplant  patient. EXAM: CT ABDOMEN AND PELVIS WITHOUT CONTRAST TECHNIQUE: Multidetector CT imaging of the abdomen and pelvis was performed following the standard protocol without IV contrast. COMPARISON:  None. FINDINGS: Lower chest: No acute findings. Hepatobiliary: No mass visualized on this unenhanced exam. Gallbladder is unremarkable. No evidence of biliary ductal dilatation. Pancreas: No mass or inflammatory process visualized on this unenhanced exam. Spleen:  Within normal limits in size. Adrenals/Urinary tract: Diffuse atrophy native kidneys is seen. Renal transplant in the right iliac fossa is unremarkable in appearance. No evidence of perinephric fluid or inflammatory changes. No evidence of hydronephrosis. Unremarkable unopacified urinary bladder. Stomach/Bowel: No evidence of obstruction, inflammatory process, or abnormal fluid collections. Vascular/Lymphatic: No pathologically enlarged lymph nodes identified. No evidence of abdominal aortic aneurysm. Aortic atherosclerotic calcification noted. Reproductive:  No mass or other significant abnormality. Other:  None. Musculoskeletal: No suspicious bone lesions identified. Internal fixation hardware noted in the left hip. 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 IMPRESSION: No acute findings within the abdomen or pelvis. Unremarkable appearance of right iliac fossa renal transplant. No evidence of hydronephrosis or perinephric fluid collections. Aortic Atherosclerosis (ICD10-I70.0). Electronically Signed   By: Marlaine Hind M.D.   On: 05/19/2020 13:34   DG Chest 2 View  Result Date: 05/19/2020 CLINICAL DATA:  Fever, immunosuppression EXAM: CHEST - 2 VIEW COMPARISON:  09/20/2017 FINDINGS: Status post median sternotomy and CABG. Unchanged small chronic right pleural effusion and/or pleural thickening. The visualized skeletal structures are unremarkable. IMPRESSION: Unchanged small  chronic right pleural effusion and/or pleural thickening. No acute appearing airspace opacity. Electronically Signed   By: Eddie Candle M.D.   On: 05/19/2020 12:42   DG Chest Portable 1 View  Result Date: 05/21/20 CLINICAL DATA:  Fever.  Sepsis.  Hypoxia. EXAM: PORTABLE CHEST 1 VIEW COMPARISON:  Chest x-ray 05/19/2020, 02/14/2011. FINDINGS: Prior CABG. Cardiomegaly with pulmonary venous congestion and bilateral interstitial prominence suggesting CHF. Pneumonitis cannot be excluded. Stable right-sided pleural thickening consistent scarring. IMPRESSION: Prior CABG. Cardiomegaly with pulmonary venous congestion and bilateral interstitial prominence suggesting CHF. Pneumonitis cannot be excluded. Electronically Signed   By: Marcello Moores  Register   On: 05/21/2020 08:14      Assessment & Plan: Pt is a 64 y.o. Hispanic  male with Diabetes Hypertension Atrial fibrillation CABG 2008 Congestive heart failure with LVEF 45% from echo 2019 February Carotid endarterectomy Renal transplant May 22, 2015, deceased donor   was admitted on 05/19/2020 with Sepsis [facility]) [A41.9]    #Acute kidney injury and renal transplant status Creatinine was 1.5 upon arrival which has improved to 1.2 today with IV hydration Patient has renal transplant from May 22, 2015. Managed with Prograf and CellCept We will continue Prograf and CellCept at home doses Avoid hypotension and volume depletion  #Urinary tract infection Urine culture is growing E. coli Currently being treated with IV cefepime CT abdomen and pelvis noncontrast is negative for renal stone or abscess  Hyponatremia Mild Likely related to renal insufficiency  Hypokalemia Allow liberal potassium diet We will add oral potassium chloride supplementation       LOS:  0 Ebbie Sorenson 10/19/20214:12 PM    Note: This note was prepared with Dragon dictation. Any transcription errors are unintentional

## 2020-05-20 NOTE — Progress Notes (Signed)
*  PRELIMINARY RESULTS* Echocardiogram 2D Echocardiogram has been performed.  Sherrie Sport 05/20/2020, 1:29 PM

## 2020-05-20 NOTE — ED Notes (Signed)
Date and time results received: 05/20/20 5:21 AM  (use smartphrase ".now" to insert current time)  Test: Lactic Critical Value: 3.5  Name of Provider Notified: Owens Shark MD  Verbal order given for 1L of LR fluid bolus

## 2020-05-20 NOTE — ED Notes (Signed)
[facility] called for bed status update and spoke with Cristie Hem that states that patient has been accepted and continues to be on the list for admission, but at this point waiting for discharges and will call us when bed assignment becomes available

## 2020-05-20 NOTE — ED Notes (Signed)
Pt placed in recliner to try and reduce coughing. Call light in reach, non skid socks placed on pt.

## 2020-05-20 NOTE — ED Notes (Signed)
This RN voiced concern about patient not having repeat labs or antibiotics ordered since initial ceftriaxone dose. Owens Shark MD advised orders to follow.

## 2020-05-20 NOTE — ED Provider Notes (Signed)
Assumed care at 7 AM. Briefly, 64 yo M with h/o renal transplant here with sepsis, UTI. Admitted to [facility]. Overnight, pt noted to become tachycardic so fluids, LA sent which showed lactic acidosis c/f sepsis. IVF given. Cultures sent.  Repeat LA improving. Temp uptrending and pt with chills - will give tylenol, admit here while awaiting [facility] bed. Reviewed prior cultures which showed pansensitive E. Coli.   Duffy Bruce, MD 05/20/20 [phone]

## 2020-05-20 NOTE — H&P (Addendum)
History and Physical    MALAKIE BALIS BJY:[id number] DOB: Oct 31, 1955 DOA: 05/19/2020  PCP: Lianne Bushy, MD   Patient coming from: Home  I have personally briefly reviewed patient's old medical records in [place]  Chief Complaint: Painful urination                                Fever  HPI: [name] is a 64 y.o. male with medical history significant for diabetes mellitus, end-stage renal disease status post right renal transplant, hypertension, coronary artery disease, paroxysmal atrial fibrillation on anticoagulation therapy who presents to the emergency room for evaluation of a 1 day history of dysuria and chills.  Patient was admitted to the hospital 09/26 to 09/28 for similar symptoms and at that time had a urine culture that yielded greater than 100,000 colony units of E. coli sensitive to cephalosporins.  Patient was seen in consultation during that hospitalization by urology who recommended to treat patient with antibiotics for 14 days.  Patient was compliant with prescribed antibiotic therapy and completed the dose the day prior to his hospitalization.  He complains of suprapubic pain and poor urine stream but denies feeling like he is unable to void completely.  Patient has a cough which started over the last 24 hours but denies having any chest pain, no shortness of breath, no nausea, no vomiting or any changes in his bowel habits. Upon arrival to the ER he had a fever with a T-max of 103 F, tachycardia with heart rate of 105, tachypnea respiratory rate of 21 Labs show sodium of 130, potassium 3.3, chloride 97, bicarb 23, glucose 143, BUN 25, creatinine 1.25, calcium 8.0, lactic acid 4.5 >> 1.2, white count 15.4 >> 12.6, hemoglobin 11.8, hematocrit 34.2, MCV 87, RDW 12.9, platelet count 151 Urine analysis shows pyuria CT scan of abdomen and pelvis shows no acute findings within the abdomen or pelvis.  Unremarkable appearance of right iliac fossa renal  transplant.  No evidence of hydronephrosis or perinephric fluid collection. Chest x-ray reviewed by me shows prior CABG.  Cardiomegaly with pulmonary venous congestion and bilateral interstitial prominence suggesting CHF.  Pneumonitis cannot be excluded. Twelve-lead EKG reviewed by me shows atrial flutter with PVCs and diffuse T wave inversions   ED Course: Patient is a 64 year old male with multiple medical problems who presents to the emergency room for evaluation of dysuria and fever.  Patient met sepsis criteria and received aggressive IV fluid resuscitation as well as antibiotic therapy.  He will be admitted to the hospital for further evaluation.  Review of Systems: As per HPI otherwise 10 point review of systems negative.    Past Medical History:  Diagnosis Date  . Arthritis   . Chronic kidney disease    KIDNEY TRANSPLANT  . Coronary artery disease   . Diabetes mellitus without complication ([place])   . Dyspnea   . GERD (gastroesophageal reflux disease)   . History of blood transfusion   . Hypertension   . Myocardial infarction [facility])     Past Surgical History:  Procedure Laterality Date  . CATARACT EXTRACTION W/PHACO Right 02/03/2017   Procedure: CATARACT EXTRACTION PHACO AND INTRAOCULAR LENS PLACEMENT (IOC);  Surgeon: Leandrew Koyanagi, MD;  Location: [facility] ORS;  Service: Ophthalmology;  Laterality: Right;  [place] 00:35.8 AP% 12.8 CDE 4.57 Fluid lot # [id number]  . CORONARY ANGIOPLASTY     STENTS X 4  . CORONARY ARTERY BYPASS  GRAFT    . ENDARTERECTOMY Left 09/06/2019   Procedure: ENDARTERECTOMY CAROTID;  Surgeon: Algernon Huxley, MD;  Location: [facility] ORS;  Service: Vascular;  Laterality: Left;  . EYE SURGERY Bilateral    cataract  . HIP SURGERY    . KIDNEY TRANSPLANT     2016     reports that he has quit smoking. He has never used smokeless tobacco. He reports that he does not drink alcohol and does not use drugs.  No Known Allergies  Family History  Problem Relation Age of  Onset  . Drug abuse Sister   . Drug abuse Brother      Prior to Admission medications   Medication Sig Start Date End Date Taking? Authorizing Provider  amLODipine (NORVASC) 10 MG tablet Take 10 mg by mouth daily.  08/06/16   [provider]  aspirin EC 81 MG EC tablet Take 1 tablet (81 mg total) by mouth daily at 6 (six) AM. 09/08/19   Stegmayer, Janalyn Harder, PA-C  atorvastatin (LIPITOR) 40 MG tablet 40 mg daily at 6 PM.  12/23/14   [provider]  carvedilol (COREG) 12.5 MG tablet Take 12.5 mg by mouth at bedtime. 03/11/20   [provider]  Cholecalciferol (VITAMIN D3) 50 MCG (2000 UT) capsule Take 2,000 Units by mouth daily.  03/17/16   [provider]  ELIQUIS 5 MG TABS tablet Take 5 mg by mouth 2 (two) times daily.  04/21/18   [provider]  HUMALOG KWIKPEN 100 UNIT/ML KiwkPen Inject 10-12 Units into the skin 3 (three) times daily. Inject 10u daily at breakfast, 10u at lunch and up to 12u at supper per SSI    [provider]  Insulin Pen Needle (ADVOCATE INSULIN PEN NEEDLES) 33G X 4 MM MISC To use with insulin pen 4 times per day. E 13.9 02/19/16   [provider]  LANTUS SOLOSTAR 100 UNIT/ML Solostar Pen Inject 24 Units into the skin at bedtime.  01/18/15   [provider]  linagliptin (TRADJENTA) 5 MG TABS tablet Take 5 mg by mouth daily.  11/23/19   [provider]  mycophenolate (CELLCEPT) 250 MG capsule Take 750 mg by mouth 2 (two) times daily.  11/14/18 11/22/20  [provider]  omeprazole (PRILOSEC) 20 MG capsule Take 20 mg by mouth daily as needed.     [provider]  phenazopyridine (PYRIDIUM) 200 MG tablet Take 1 tablet (200 mg total) by mouth 3 (three) times daily with meals. 04/29/20   Nicole Kindred A, DO  PROGRAF 1 MG capsule Take 1 mg by mouth 2 (two) times daily.  08/11/16   [provider]  tamsulosin (FLOMAX) 0.4 MG CAPS capsule Take 0.4 mg by mouth 2 (two) times daily.      [provider]    Physical Exam: Vitals:   05/20/20 0630 05/20/20 0645 05/20/20 0700 05/20/20 0934  BP:   117/63   Pulse:      Resp: (!) 23 (!) 24 (!) 23   Temp:    100.1 F (37.8 C)  TempSrc:    Oral  SpO2:      Weight:      Height:         Vitals:   05/20/20 0630 05/20/20 0645 05/20/20 0700 05/20/20 0934  BP:   117/63   Pulse:      Resp: (!) 23 (!) 24 (!) 23   Temp:    100.1 F (37.8 C)  TempSrc:  Oral  SpO2:      Weight:      Height:        Constitutional: NAD, alert and oriented x 3 Eyes: PERRL, lids and conjunctivae pallor ENMT: Mucous membranes are moist.  Neck: normal, supple, no masses, no thyromegaly Respiratory: Faint crackles at the bases, no wheezing, no crackles. Normal respiratory effort. No accessory muscle use.  Cardiovascular: Regular rate and rhythm, no murmurs / rubs / gallops. No extremity edema. 2+ pedal pulses. No carotid bruits.  Abdomen: Suprapubic tenderness, no masses palpated. No hepatosplenomegaly. Bowel sounds positive.  Musculoskeletal: no clubbing / cyanosis. No joint deformity upper and lower extremities.  Skin: no rashes, lesions, ulcers.  Neurologic: No gross focal neurologic deficit. Psychiatric: Normal mood and affect.   Labs on Admission: I have personally reviewed following labs and imaging studies  CBC: Recent Labs  Lab 05/19/20 0709 05/20/20 0716  WBC 15.4* 12.6*  HGB 13.5 11.8*  HCT 38.9* 34.2*  MCV 86.6 87.0  PLT 218 591   Basic Metabolic Panel: Recent Labs  Lab 05/19/20 0709 05/20/20 0716  NA 132* 130*  K 3.9 3.3*  CL 97* 97*  CO2 22 23  GLUCOSE 209* 143*  BUN 30* 25*  CREATININE 1.52* 1.25*  CALCIUM 8.6* 8.0*   GFR: Estimated Creatinine Clearance: 57 mL/min (A) (by C-G formula based on SCr of 1.25 mg/dL (H)). Liver Function Tests: Recent Labs  Lab 05/19/20 0709  AST 22  ALT 20  ALKPHOS 44  BILITOT 1.9*  PROT 7.3  ALBUMIN 4.0   No results for input(s): LIPASE, AMYLASE in the last  168 hours. No results for input(s): AMMONIA in the last 168 hours. Coagulation Profile: Recent Labs  Lab 05/19/20 1329  INR 1.4*   Cardiac Enzymes: No results for input(s): CKTOTAL, CKMB, CKMBINDEX, TROPONINI in the last 168 hours. BNP (last 3 results) No results for input(s): PROBNP in the last 8760 hours. HbA1C: No results for input(s): HGBA1C in the last 72 hours. CBG: Recent Labs  Lab 05/19/20 1502 05/19/20 1946 05/20/20 0116 05/20/20 0416 05/20/20 0827  GLUCAP 196* 222* 192* 187* 124*   Lipid Profile: No results for input(s): CHOL, HDL, LDLCALC, TRIG, CHOLHDL, LDLDIRECT in the last 72 hours. Thyroid Function Tests: No results for input(s): TSH, T4TOTAL, FREET4, T3FREE, THYROIDAB in the last 72 hours. Anemia Panel: No results for input(s): VITAMINB12, FOLATE, FERRITIN, TIBC, IRON, RETICCTPCT in the last 72 hours. Urine analysis:    Component Value Date/Time   COLORURINE AMBER (A) 05/19/2020 0709   APPEARANCEUR CLOUDY (A) 05/19/2020 0709   LABSPEC 1.025 05/19/2020 0709   PHURINE 5.0 05/19/2020 0709   GLUCOSEU NEGATIVE 05/19/2020 0709   HGBUR MODERATE (A) 05/19/2020 0709   BILIRUBINUR NEGATIVE 05/19/2020 0709   KETONESUR NEGATIVE 05/19/2020 0709   PROTEINUR 30 (A) 05/19/2020 0709   UROBILINOGEN 1.0 12/16/2010 1357   NITRITE NEGATIVE 05/19/2020 0709   LEUKOCYTESUR MODERATE (A) 05/19/2020 0709    Radiological Exams on Admission: CT ABDOMEN PELVIS WO CONTRAST  Result Date: 05/19/2020 CLINICAL DATA:  Diffuse abdominal pain for 3 days. Chills. Dysuria. Renal transplant patient. EXAM: CT ABDOMEN AND PELVIS WITHOUT CONTRAST TECHNIQUE: Multidetector CT imaging of the abdomen and pelvis was performed following the standard protocol without IV contrast. COMPARISON:  None. FINDINGS: Lower chest: No acute findings. Hepatobiliary: No mass visualized on this unenhanced exam. Gallbladder is unremarkable. No evidence of biliary ductal dilatation. Pancreas: No mass or inflammatory  process visualized on this unenhanced exam. Spleen:  Within normal limits in  size. Adrenals/Urinary tract: Diffuse atrophy native kidneys is seen. Renal transplant in the right iliac fossa is unremarkable in appearance. No evidence of perinephric fluid or inflammatory changes. No evidence of hydronephrosis. Unremarkable unopacified urinary bladder. Stomach/Bowel: No evidence of obstruction, inflammatory process, or abnormal fluid collections. Vascular/Lymphatic: No pathologically enlarged lymph nodes identified. No evidence of abdominal aortic aneurysm. Aortic atherosclerotic calcification noted. Reproductive:  No mass or other significant abnormality. Other:  None. Musculoskeletal: No suspicious bone lesions identified. Internal fixation hardware noted in the left hip. 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 IMPRESSION: No acute findings within the abdomen or pelvis. Unremarkable appearance of right iliac fossa renal transplant. No evidence of hydronephrosis or perinephric fluid collections. Aortic Atherosclerosis (ICD10-I70.0). Electronically Signed   By: Marlaine Hind M.D.   On: 05/19/2020 13:34   DG Chest 2 View  Result Date: 05/19/2020 CLINICAL DATA:  Fever, immunosuppression EXAM: CHEST - 2 VIEW COMPARISON:  09/20/2017 FINDINGS: Status post median sternotomy and CABG. Unchanged small chronic right pleural effusion and/or pleural thickening. The visualized skeletal structures are unremarkable. IMPRESSION: Unchanged small chronic right pleural effusion and/or pleural thickening. No acute appearing airspace opacity. Electronically Signed   By: Eddie Candle M.D.   On: 05/19/2020 12:42   DG Chest Portable 1 View  Result Date: 05/20/2020 CLINICAL DATA:  Fever.  Sepsis.  Hypoxia. EXAM: PORTABLE CHEST 1 VIEW COMPARISON:  Chest x-ray 05/19/2020, 02/14/2011. FINDINGS: Prior CABG. Cardiomegaly with pulmonary venous congestion and  bilateral interstitial prominence suggesting CHF. Pneumonitis cannot be excluded. Stable right-sided pleural thickening consistent scarring. IMPRESSION: Prior CABG. Cardiomegaly with pulmonary venous congestion and bilateral interstitial prominence suggesting CHF. Pneumonitis cannot be excluded. Electronically Signed   By: Marcello Moores  Register   On: 05/20/2020 08:14    EKG: Independently reviewed.  Atrial flutter with PVCs Diffuse T wave inversions  Assessment/Plan Principal Problem:   Sepsis secondary to UTI [facility]) Active Problems:   Diabetes ([place])   Essential hypertension   Coronary artery disease   Paroxysmal atrial fibrillation ([facility])      Sepsis secondary to UTI ([facility]) As evidenced by fever with a T-max of 103 F, tachycardia, tachypnea, elevated lactic acid level of 4.5, mild leukocytosis with a left shift and pyuria Most recent urine culture UTI E. Coli Patient received sepsis fluids bolus in the ER Continue antibiotic therapy with cefepime until urine culture results become available Follow-up results of blood and urine culture   Diabetes mellitus Maintain consistent carbohydrate diet Glycemic control with insulin   Paroxysmal atrial fibrillation Continue carvedilol for rate control Continue apixaban as primary prophylaxis for an acute stroke   Status post renal transplant Continue mycophenolate and tacrolimus Request nephrology consult   Hypertension Continue amlodipine and carvedilol    History of coronary artery disease Continue aspirin, statins and beta-blockers    Acute CHF Most likely systolic Last known 2D echocardiogram showed an LVEF of 45% with diffuse hypokinesis from 2019 We will repeat 2D echocardiogram Hep-Lock IV fluids We will give a dose of Lasix 40 mg IV x1 dose Continue carvedilol   BPH Continue Flomax   DVT prophylaxis: Apixaban Code Status: Full code Family Communication: Greater than 50% of time was spent discussing patient's  condition and plan of care with him and his daughter at the bedside.  All questions and concerns have been addressed.  He verbalizes understanding and agrees with the plan. Disposition Plan: Back to previous home environment Consults called:  Nephrology    Collier Bullock MD [facility]     05/20/2020, 11:29 AM

## 2020-05-20 NOTE — Progress Notes (Addendum)
Pharmacy Antibiotic Note  [name] is a 64 y.o. male admitted on 05/19/2020 with UTI. CC painful urination, fever. WBC 15.4 on admission. Urinalysis shows pyuria. PMH dx UTI on 9/26 (>100,000 E. Coli pan-sensitive), ESRD s/p right renal transplant, paroxysmal AF, HTN, CAD, DM.  Pharmacy has been consulted for cefepime dosing.  Plan: Cefepime 2 g every 12 hours (30 min infusion)  Height: 5\' 8"  (172.7 cm) Weight: 73.5 kg (162 lb 0.6 oz) IBW/kg (Calculated) : 68.4  Temp (24hrs), Avg:100.1 F (37.8 C), Min:98.3 F (36.8 C), Max:103.1 F (39.5 C)  Recent Labs  Lab 05/19/20 0709 05/19/20 1329 05/20/20 0425 05/20/20 0601 05/20/20 0716  WBC 15.4*  --   --   --  12.6*  CREATININE 1.52*  --   --   --  1.25*  LATICACIDVEN 1.3 2.1* 3.5* 4.5* 1.2    Estimated Creatinine Clearance: 57 mL/min (A) (by C-G formula based on SCr of 1.25 mg/dL (H)).    No Known Allergies  Antimicrobials this admission: 10/18 ceftriaxone >> 10/19 10/19 cefepime >>  Microbiology results: 10/18 BCx: NGTD 10/18 UCx: >100,000 E. coli  Thank you for allowing pharmacy to be a part of this patient's care.  Delena Bali  PharmD Candidate, Class of 2023 [facility] [employer] [facility] [employer] 05/20/2020 11:17 AM

## 2020-05-20 NOTE — ED Notes (Signed)
Date and time results received: 05/20/20 6:57 AM  Test: Lactic  Critical Value: 4.5   Name of Provider Notified: Owens Shark MD

## 2020-05-20 NOTE — ED Notes (Signed)
Pt ambulatory to bathroom

## 2020-05-20 NOTE — ED Notes (Signed)
Pt updated on plan of care status and plans for [facility] transfer.

## 2020-05-21 ENCOUNTER — Ambulatory Visit: Payer: Self-pay | Admitting: Urology

## 2020-05-21 DIAGNOSIS — A419 Sepsis, unspecified organism: Secondary | ICD-10-CM | POA: Diagnosis not present

## 2020-05-21 DIAGNOSIS — N390 Urinary tract infection, site not specified: Secondary | ICD-10-CM | POA: Diagnosis not present

## 2020-05-21 LAB — BASIC METABOLIC PANEL
Anion gap: 11 (ref 5–15)
BUN: 22 mg/dL (ref 8–23)
CO2: 24 mmol/L (ref 22–32)
Calcium: 7.8 mg/dL — ABNORMAL LOW (ref 8.9–10.3)
Chloride: 96 mmol/L — ABNORMAL LOW (ref 98–111)
Creatinine, Ser: 1.34 mg/dL — ABNORMAL HIGH (ref 0.61–1.24)
GFR, Estimated: 55 mL/min — ABNORMAL LOW (ref >60)
Glucose, Bld: 223 mg/dL — ABNORMAL HIGH (ref 70–99)
Potassium: 2.9 mmol/L — ABNORMAL LOW (ref 3.5–5.1)
Sodium: 131 mmol/L — ABNORMAL LOW (ref 135–145)

## 2020-05-21 LAB — GLUCOSE, CAPILLARY
Glucose-Capillary: 227 mg/dL — ABNORMAL HIGH (ref 70–99)
Glucose-Capillary: 275 mg/dL — ABNORMAL HIGH (ref 70–99)
Glucose-Capillary: 244 mg/dL — ABNORMAL HIGH (ref 70–99)
Glucose-Capillary: 257 mg/dL — ABNORMAL HIGH (ref 70–99)

## 2020-05-21 LAB — CBC
HCT: 32.8 % — ABNORMAL LOW (ref 39.0–52.0)
Hemoglobin: 11.2 g/dL — ABNORMAL LOW (ref 13.0–17.0)
MCH: 29.9 pg (ref 26.0–34.0)
MCHC: 34.1 g/dL (ref 30.0–36.0)
MCV: 87.7 fL (ref 80.0–100.0)
Platelets: 144 10*3/uL — ABNORMAL LOW (ref 150–400)
RBC: 3.74 MIL/uL — ABNORMAL LOW (ref 4.22–5.81)
RDW: 13.1 % (ref 11.5–15.5)
WBC: 8.4 10*3/uL (ref 4.0–10.5)
nRBC: 0 % (ref 0.0–0.2)

## 2020-05-21 LAB — PROTIME-INR
INR: 1.7 — ABNORMAL HIGH (ref 0.8–1.2)
Prothrombin Time: 19.2 seconds — ABNORMAL HIGH (ref 11.4–15.2)

## 2020-05-21 LAB — PROCALCITONIN: Procalcitonin: 0.61 ng/mL

## 2020-05-21 LAB — CORTISOL-AM, BLOOD: Cortisol - AM: 12.4 ug/dL (ref 6.7–22.6)

## 2020-05-21 LAB — URINE CULTURE: Culture: >=100000 — AB

## 2020-05-21 MED ORDER — CEFAZOLIN SODIUM-DEXTROSE 1-4 GM/50ML-% IV SOLN
1.0000 g | Freq: Three times a day (TID) | INTRAVENOUS | Status: DC
  Administered 2020-05-21 – 2020-05-22 (×4): 1 g via INTRAVENOUS
  Filled 2020-05-21 (×6): qty 50

## 2020-05-21 MED ORDER — TAMSULOSIN HCL 0.4 MG PO CAPS
0.8000 mg | ORAL_CAPSULE | Freq: Every day | ORAL | Status: DC
  Administered 2020-05-22: 08:00:00 0.8 mg via ORAL
  Filled 2020-05-21: qty 2

## 2020-05-21 MED ORDER — TAMSULOSIN HCL 0.4 MG PO CAPS
0.4000 mg | ORAL_CAPSULE | Freq: Once | ORAL | Status: AC
  Administered 2020-05-21: 0.4 mg via ORAL
  Filled 2020-05-21: qty 1

## 2020-05-21 NOTE — Plan of Care (Signed)

## 2020-05-21 NOTE — Progress Notes (Signed)
Pharmacy Antibiotic Note  [name] is a 64 y.o. male admitted on 05/19/2020 with UTI.   CC painful urination, fever. WBC 15.4 on admission. Urinalysis shows pyuria. PMH dx UTI on 9/26 (>100,000 E. Coli pan-sensitive), ESRD s/p right renal transplant, paroxysmal AF, HTN, CAD, DM.    Culture sensitivities have returned and pharmacy has now been consulted for cefazolin dosing.  Plan: Will start Cefazolin 1g q8h  Height: 5\' 8"  (172.7 cm) Weight: 73.5 kg (162 lb 0.6 oz) IBW/kg (Calculated) : 68.4  Temp (24hrs), Avg:99.1 F (37.3 C), Min:98.1 F (36.7 C), Max:100.1 F (37.8 C)  Recent Labs  Lab 05/19/20 0709 05/19/20 0709 05/19/20 1329 05/20/20 0425 05/20/20 0601 05/20/20 0716 05/20/20 1145 05/21/20 0532  WBC 15.4*  --   --   --   --  12.6*  --  8.4  CREATININE 1.52*  --   --   --   --  1.25*  --  1.34*  LATICACIDVEN 1.3   < > 2.1* 3.5* 4.5* 1.2 1.4  --    < > = values in this interval not displayed.    Estimated Creatinine Clearance: 53.2 mL/min (A) (by C-G formula based on SCr of 1.34 mg/dL (H)).    No Known Allergies  Antimicrobials this admission: 10/18 ceftriaxone >> 10/19 10/19 cefepime >> 10/20 10/20 cefazolin >>  Microbiology results: 10/18 BCx: NGTD 10/18 UCx: >100,000 E. Coli - pan-sensitive  Thank you for allowing pharmacy to be a part of this patient's care.  Lu Duffel, PharmD, BCPS Clinical Pharmacist 05/21/2020 8:15 AM

## 2020-05-21 NOTE — Progress Notes (Signed)
[facility] Progress Note  Patient: [name]    DGU:[id number]  DOA: 05/19/2020     Date of Service: the patient was seen and examined on 05/21/2020  Brief hospital course: Past medical history of type II DM, ESRD SP renal transplant, HTN, CAD, A. Fib.  Presents with recurrent UTI. Currently plan is continue antibiotics in IV form.  Assessment and Plan: Sepsis secondary to UTI ([facility]) As evidenced by fever with a T-max of 103 F, tachycardia, tachypnea, elevated lactic acid level of 4.5, mild leukocytosis with a left shift and pyuria Most recent urine culture UTI E. Coli Patient received sepsis fluids bolus in the ER Continue antibiotic therapy with switch IV cefepime to IV cefazolin.  Type II diabetes mellitus Maintain consistent carbohydrate diet Glycemic control with insulin  Paroxysmal atrial fibrillation Continue carvedilol for rate control Continue apixaban as primary prophylaxis for an acute stroke  Status post renal transplant Continue mycophenolate and tacrolimus Request nephrology consult, appreciate input.  Hypertension Continue amlodipine and carvedilol  History of coronary artery disease Continue aspirin, statins and beta-blockers  Chronic systolic CHF Does not appear volume overloaded. Last known 2D echocardiogram showed an LVEF of 45% with diffuse hypokinesis from 2019 Unchanged repeat echocardiogram. Continue carvedilol  BPH Continue Flomax  Diet: Regular diet DVT Prophylaxis:    apixaban (ELIQUIS) tablet 5 mg    Advance goals of care discussion: Full code  Family Communication: no family was present at bedside, at the time of interview.   Disposition:  Status is: Inpatient  Remains inpatient appropriate because:IV treatments appropriate due to intensity of illness or inability to take PO   Dispo: The patient is from: Home              Anticipated d/c is to: Home              Anticipated d/c date is: 2 days               Patient currently is not medically stable to d/c.  Subjective: Continues to have fatigue and tiredness.  Reports retention of the urine after voiding.  No nausea or vomiting.  Supposed to see urology today but unfortunately still hospitalized.  Has an appointment next week.  Physical Exam:  General: Appear in mild distress, no Rash; Oral Mucosa Clear, moist. no Abnormal Neck Mass Or lumps, Conjunctiva normal  Cardiovascular: S1 and S2 Present, no Murmur, Respiratory: good respiratory effort, Bilateral Air entry present and CTA, no Crackles, no wheezes Abdomen: Bowel Sound present, Soft and no tenderness Extremities: no Pedal edema Neurology: alert and oriented to time, place, and person affect appropriate. no new focal deficit Gait not checked due to patient safety concerns  Vitals:   05/21/20 0801 05/21/20 0802 05/21/20 1223 05/21/20 1530  BP: (!) 146/63  (!) 128/59 (!) 131/56  Pulse: (!) 40 83 76 75  Resp: 18  16 14   Temp: 98.1 F (36.7 C)  98.4 F (36.9 C) 98.2 F (36.8 C)  TempSrc: Oral  Oral Oral  SpO2: 100% 100% 95% 95%  Weight:      Height:        Intake/Output Summary (Last 24 hours) at 05/21/2020 1917 Last data filed at 05/21/2020 1900 Gross per 24 hour  Intake 600.39 ml  Output 1300 ml  Net -699.61 ml   Filed Weights   05/19/20 0645 05/19/20 0705  Weight: 73.5 kg 73.5 kg    Data Reviewed: I have personally reviewed and interpreted daily labs, tele strips,  imagings as discussed above. I reviewed all nursing notes, pharmacy notes, vitals, pertinent old records I have discussed plan of care as described above with RN and patient/family.  CBC: Recent Labs  Lab 05/19/20 0709 05/20/20 0716 05/21/20 0532  WBC 15.4* 12.6* 8.4  HGB 13.5 11.8* 11.2*  HCT 38.9* 34.2* 32.8*  MCV 86.6 87.0 87.7  PLT 218 151 758*   Basic Metabolic Panel: Recent Labs  Lab 05/19/20 0709 05/20/20 0716 05/21/20 0532  NA 132* 130* 131*  K 3.9 3.3* 2.9*  CL 97* 97* 96*  CO2  22 23 24   GLUCOSE 209* 143* 223*  BUN 30* 25* 22  CREATININE 1.52* 1.25* 1.34*  CALCIUM 8.6* 8.0* 7.8*    Studies: No results found.  Scheduled Meds:  amLODipine  10 mg Oral Daily   apixaban  5 mg Oral BID   aspirin EC  81 mg Oral Q0600   atorvastatin  40 mg Oral q1800   carvedilol  3.125 mg Oral BID WC   cholecalciferol  2,000 Units Oral Daily   insulin aspart  0-15 Units Subcutaneous TID WC   mycophenolate  750 mg Oral BID   phenazopyridine  200 mg Oral TID WC   potassium chloride  20 mEq Oral Daily   tacrolimus  1 mg Oral BID   [START ON 05/22/2020] tamsulosin  0.8 mg Oral Daily   Continuous Infusions:   ceFAZolin (ANCEF) IV Stopped (05/21/20 1357)   PRN Meds: ondansetron **OR** ondansetron (ZOFRAN) IV  Time spent: 35 minutes  Author: Berle Mull, MD [facility] 05/21/2020 7:17 PM  To reach On-call, see care teams to locate the attending and reach out via [email or web address]. Between 7PM-7AM, please contact night-coverage If you still have difficulty reaching the attending provider, please page the [facility] (Director on Call) for [facility] on amion for assistance.

## 2020-05-21 NOTE — Progress Notes (Signed)
[address] [place], [place] [address] Phone [phone]. Fax [phone]  Date: 05/21/2020                  Patient Name:  [name]  MRN: [id number]  DOB: 04-29-1956  Age / Sex: 64 y.o., male         PCP: Lianne Bushy, MD                 Service Requesting Consult: IM/ Lavina Hamman, MD                 Reason for Consult: Renal transplant             History of Present Illness: Patient is a 64 y.o. male  admitted to [facility] on 05/19/2020    Patient presents to the emergency room for dysuria and chills for about a day or so.  He reports his appetite has been poor for the past 2 days.  He has recent history of admission to the hospital from September 26 to September 28 for similar symptoms and had a day urinary tract infection with E. coli at that time.  Patient reports compliance with antibiotic therapy post discharge.  He comes in again with lower abdominal/suprapubic discomfort.  In the ER he was febrile, tachycardic.  CT of the abdomen pelvis is negative for stone.  Review of records in care everywhere show that his baseline creatinine is 1.2-1.4.  He is followed at [facility].  Admission creatinine this time was 1.52   Patient's creatinine today is about the same at 1.34 He looks and feels better Ambulatory in the room Reports that he has less pain with urination but not completely resolved Appetite appears to be improving slowly.   Vital Signs: Blood pressure (!) 131/56, pulse 75, temperature 98.2 F (36.8 C), temperature source Oral, resp. rate 14, height 5\' 8"  (1.727 m), weight 73.5 kg, SpO2 95 %.   Intake/Output Summary (Last 24 hours) at 05/21/2020 1601 Last data filed at 05/21/2020 1523 Gross per 24 hour  Intake 600.39 ml  Output 1000 ml  Net -399.61 ml    Weight trends: [employer]   05/19/20 0645 05/19/20 0705  Weight: 73.5 kg 73.5 kg   Physical Exam: General:  No acute distress, laying in the bed  HEENT   anicteric, moist oral mucous membrane  Pulm/lungs  normal breathing effort, lungs are clear to auscultation  CVS/Heart  regular rhythm, no rub or gallop  Abdomen:   Soft, nontender  Extremities:  No peripheral edema  Neurologic:  Alert, oriented, able to follow commands  Skin:  No acute rashes      Lab results: Basic Metabolic Panel: Recent Labs  Lab 05/19/20 0709 05/20/20 0716 05/21/20 0532  NA 132* 130* 131*  K 3.9 3.3* 2.9*  CL 97* 97* 96*  CO2 22 23 24   GLUCOSE 209* 143* 223*  BUN 30* 25* 22  CREATININE 1.52* 1.25* 1.34*  CALCIUM 8.6* 8.0* 7.8*    Liver Function Tests: Recent Labs  Lab 05/19/20 0709  AST 22  ALT 20  ALKPHOS 44  BILITOT 1.9*  PROT 7.3  ALBUMIN 4.0   No results for input(s): LIPASE, AMYLASE in the last 168 hours. No results for input(s): AMMONIA in the last 168 hours.  CBC: Recent Labs  Lab 05/20/20 0716 05/21/20 0532  WBC 12.6* 8.4  HGB 11.8* 11.2*  HCT 34.2* 32.8*  MCV 87.0 87.7  PLT 151 144*  Cardiac Enzymes: No results for input(s): CKTOTAL, TROPONINI in the last 168 hours.  BNP: Invalid input(s): POCBNP  CBG: Recent Labs  Lab 05/20/20 1335 05/20/20 1748 05/20/20 2110 05/21/20 0802 05/21/20 1225  GLUCAP 169* 251* 164* 227* 275*    Microbiology: Recent Results (from the past 720 hour(s))  Urine Culture     Status: Abnormal   Collection Time: 04/26/20  9:56 PM   Specimen: Urine, Random  Result Value Ref Range Status   Specimen Description   Final    URINE, RANDOM Performed at [facility], [address] [place], [place] [address]    Special Requests   Final    NONE Performed at [facility], [address] [place], [place] [address]    Culture >=100,000 COLONIES/mL ESCHERICHIA COLI (A)  Final   Report Status 04/29/2020 FINAL  Final   Organism ID, Bacteria ESCHERICHIA COLI (A)  Final      Susceptibility   Escherichia coli - MIC*    AMPICILLIN <=2 SENSITIVE Sensitive     CEFAZOLIN  <=4 SENSITIVE Sensitive     CEFTRIAXONE <=0.25 SENSITIVE Sensitive     CIPROFLOXACIN <=0.25 SENSITIVE Sensitive     GENTAMICIN <=1 SENSITIVE Sensitive     IMIPENEM <=0.25 SENSITIVE Sensitive     NITROFURANTOIN <=16 SENSITIVE Sensitive     TRIMETH/SULFA <=20 SENSITIVE Sensitive     AMPICILLIN/SULBACTAM <=2 SENSITIVE Sensitive     PIP/TAZO <=4 SENSITIVE Sensitive     * >=100,000 COLONIES/mL ESCHERICHIA COLI  Culture, blood (routine x 2)     Status: None   Collection Time: 04/27/20  2:12 AM   Specimen: BLOOD  Result Value Ref Range Status   Specimen Description BLOOD BLOOD RIGHT HAND  Final   Special Requests   Final    BOTTLES DRAWN AEROBIC AND ANAEROBIC Blood Culture adequate volume   Culture   Final    NO GROWTH 5 DAYS Performed at [facility], [address] [place], [place] [address]    Report Status 05/02/2020 FINAL  Final  Culture, blood (routine x 2)     Status: None   Collection Time: 04/27/20  2:29 AM   Specimen: BLOOD  Result Value Ref Range Status   Specimen Description BLOOD RT HAND  Final   Special Requests   Final    BOTTLES DRAWN AEROBIC AND ANAEROBIC Blood Culture adequate volume   Culture   Final    NO GROWTH 5 DAYS Performed at [facility], [address] [place], [place] [address]    Report Status 05/02/2020 FINAL  Final  Respiratory Panel by RT PCR (Flu A&B, Covid) - Nasopharyngeal Swab     Status: None   Collection Time: 04/27/20  3:49 AM   Specimen: Nasopharyngeal Swab  Result Value Ref Range Status   SARS Coronavirus 2 by RT PCR NEGATIVE NEGATIVE Final    Comment: (NOTE) SARS-CoV-2 target nucleic acids are NOT DETECTED.  The SARS-CoV-2 RNA is generally detectable in upper respiratoy specimens during the acute phase of infection. The lowest concentration of SARS-CoV-2 viral copies this assay can detect is 131 copies/mL. A negative result does not preclude SARS-Cov-2 infection and should not be used as the sole basis for  treatment or other patient management decisions. A negative result may occur with  improper specimen collection/handling, submission of specimen other than nasopharyngeal swab, presence of viral mutation(s) within the areas targeted by this assay, and inadequate number of viral copies (<131 copies/mL). A negative result must be  combined with clinical observations, patient history, and epidemiological information. The expected result is Negative.  Fact Sheet for Patients:  PinkCheek.be  Fact Sheet for Healthcare Providers:  GravelBags.it  This test is no t yet approved or cleared by the [place] FDA and  has been authorized for detection and/or diagnosis of SARS-CoV-2 by FDA under an Emergency Use Authorization (EUA). This EUA will remain  in effect (meaning this test can be used) for the duration of the COVID-19 declaration under Section 564(b)(1) of the Act, 21 U.S.C. section 360bbb-3(b)(1), unless the authorization is terminated or revoked sooner.     Influenza A by PCR NEGATIVE NEGATIVE Final   Influenza B by PCR NEGATIVE NEGATIVE Final    Comment: (NOTE) The Xpert Xpress SARS-CoV-2/FLU/RSV assay is intended as an aid in  the diagnosis of influenza from Nasopharyngeal swab specimens and  should not be used as a sole basis for treatment. Nasal washings and  aspirates are unacceptable for Xpert Xpress SARS-CoV-2/FLU/RSV  testing.  Fact Sheet for Patients: PinkCheek.be  Fact Sheet for Healthcare Providers: GravelBags.it  This test is not yet approved or cleared by the [place] FDA and  has been authorized for detection and/or diagnosis of SARS-CoV-2 by  FDA under an Emergency Use Authorization (EUA). This EUA will remain  in effect (meaning this test can be used) for the duration of the  Covid-19 declaration under Section 564(b)(1) of the Act, 21   U.S.C. section 360bbb-3(b)(1), unless the authorization is  terminated or revoked. Performed at [facility], [address] [place], [place] [address]   Urine Culture     Status: Abnormal   Collection Time: 05/19/20  6:46 AM   Specimen: Urine, Random  Result Value Ref Range Status   Specimen Description   Final    URINE, RANDOM Performed at [facility], [address] [place], [place] [address]    Special Requests   Final    NONE Performed at [facility], [address] [place], [place] [address]    Culture >=100,000 COLONIES/mL ESCHERICHIA COLI (A)  Final   Report Status 05/21/2020 FINAL  Final   Organism ID, Bacteria ESCHERICHIA COLI (A)  Final      Susceptibility   Escherichia coli - MIC*    AMPICILLIN <=2 SENSITIVE Sensitive     CEFAZOLIN <=4 SENSITIVE Sensitive     CEFTRIAXONE <=0.25 SENSITIVE Sensitive     CIPROFLOXACIN <=0.25 SENSITIVE Sensitive     GENTAMICIN <=1 SENSITIVE Sensitive     IMIPENEM <=0.25 SENSITIVE Sensitive     NITROFURANTOIN <=16 SENSITIVE Sensitive     TRIMETH/SULFA <=20 SENSITIVE Sensitive     AMPICILLIN/SULBACTAM <=2 SENSITIVE Sensitive     PIP/TAZO <=4 SENSITIVE Sensitive     * >=100,000 COLONIES/mL ESCHERICHIA COLI  Blood culture (routine x 2)     Status: None (Preliminary result)   Collection Time: 05/19/20 12:19 PM   Specimen: BLOOD  Result Value Ref Range Status   Specimen Description BLOOD BLOOD LEFT HAND  Final   Special Requests   Final    BOTTLES DRAWN AEROBIC AND ANAEROBIC Blood Culture results may not be optimal due to an inadequate volume of blood received in culture bottles   Culture   Final    NO GROWTH 2 DAYS Performed at [facility], [address] [place], [place] [address]    Report Status PENDING  Incomplete  Respiratory Panel by RT PCR (Flu A&B, Covid) - Nasopharyngeal Swab  Status: None   Collection Time: 05/19/20  1:29 PM   Specimen: Nasopharyngeal Swab  Result Value  Ref Range Status   SARS Coronavirus 2 by RT PCR NEGATIVE NEGATIVE Final    Comment: (NOTE) SARS-CoV-2 target nucleic acids are NOT DETECTED.  The SARS-CoV-2 RNA is generally detectable in upper respiratoy specimens during the acute phase of infection. The lowest concentration of SARS-CoV-2 viral copies this assay can detect is 131 copies/mL. A negative result does not preclude SARS-Cov-2 infection and should not be used as the sole basis for treatment or other patient management decisions. A negative result may occur with  improper specimen collection/handling, submission of specimen other than nasopharyngeal swab, presence of viral mutation(s) within the areas targeted by this assay, and inadequate number of viral copies (<131 copies/mL). A negative result must be combined with clinical observations, patient history, and epidemiological information. The expected result is Negative.  Fact Sheet for Patients:  PinkCheek.be  Fact Sheet for Healthcare Providers:  GravelBags.it  This test is no t yet approved or cleared by the [place] FDA and  has been authorized for detection and/or diagnosis of SARS-CoV-2 by FDA under an Emergency Use Authorization (EUA). This EUA will remain  in effect (meaning this test can be used) for the duration of the COVID-19 declaration under Section 564(b)(1) of the Act, 21 U.S.C. section 360bbb-3(b)(1), unless the authorization is terminated or revoked sooner.     Influenza A by PCR NEGATIVE NEGATIVE Final   Influenza B by PCR NEGATIVE NEGATIVE Final    Comment: (NOTE) The Xpert Xpress SARS-CoV-2/FLU/RSV assay is intended as an aid in  the diagnosis of influenza from Nasopharyngeal swab specimens and  should not be used as a sole basis for treatment. Nasal washings and  aspirates are unacceptable for Xpert Xpress SARS-CoV-2/FLU/RSV  testing.  Fact Sheet for  Patients: PinkCheek.be  Fact Sheet for Healthcare Providers: GravelBags.it  This test is not yet approved or cleared by the [place] FDA and  has been authorized for detection and/or diagnosis of SARS-CoV-2 by  FDA under an Emergency Use Authorization (EUA). This EUA will remain  in effect (meaning this test can be used) for the duration of the  Covid-19 declaration under Section 564(b)(1) of the Act, 21  U.S.C. section 360bbb-3(b)(1), unless the authorization is  terminated or revoked. Performed at [facility], [address] [place], [place] [address]   Blood culture (routine x 2)     Status: None (Preliminary result)   Collection Time: 05/19/20  1:29 PM   Specimen: BLOOD  Result Value Ref Range Status   Specimen Description BLOOD BLOOD RIGHT HAND  Final   Special Requests   Final    BOTTLES DRAWN AEROBIC AND ANAEROBIC Blood Culture adequate volume   Culture   Final    NO GROWTH 2 DAYS Performed at [facility], [address], [place], [place] [address]    Report Status PENDING  Incomplete     Coagulation Studies: Recent Labs    05/19/20 1329 05/21/20 0532  LABPROT 16.5* 19.2*  INR 1.4* 1.7*    Urinalysis: Recent Labs    05/19/20 0709  COLORURINE AMBER*  LABSPEC 1.025  PHURINE 5.0  GLUCOSEU NEGATIVE  HGBUR MODERATE*  BILIRUBINUR NEGATIVE  KETONESUR NEGATIVE  PROTEINUR 30*  NITRITE NEGATIVE  LEUKOCYTESUR MODERATE*        Imaging: DG Chest Portable 1 View  Result Date: 05/20/2020 CLINICAL DATA:  Fever.  Sepsis.  Hypoxia. EXAM: PORTABLE CHEST 1  VIEW COMPARISON:  Chest x-ray 05/19/2020, 02/14/2011. FINDINGS: Prior CABG. Cardiomegaly with pulmonary venous congestion and bilateral interstitial prominence suggesting CHF. Pneumonitis cannot be excluded. Stable right-sided pleural thickening consistent scarring. IMPRESSION: Prior CABG. Cardiomegaly with pulmonary venous  congestion and bilateral interstitial prominence suggesting CHF. Pneumonitis cannot be excluded. Electronically Signed   By: Marcello Moores  Register   On: 05/20/2020 08:14   ECHOCARDIOGRAM COMPLETE  Result Date: 05/20/2020    ECHOCARDIOGRAM REPORT   Patient Name:   [name] Date of Exam: 05/20/2020 Medical Rec #:  [id number]          Height:       68.0 in Accession #:    [id number]         Weight:       162.0 lb Date of Birth:  01-16-1956          BSA:          1.869 m Patient Age:    26 years           BP:           127/54 mmHg Patient Gender: M                  HR:           54 bpm. Exam Location:  [facility] Procedure: 2D Echo, Cardiac Doppler and Color Doppler Indications:     CHF- acute systolic 119.41  History:         Patient has prior history of Echocardiogram examinations, most                  recent 09/21/2017. CAD and Previous Myocardial Infarction; Risk                  Factors:Diabetes and Hypertension.  Sonographer:     Sherrie Sport RDCS (AE) Referring Phys:  [id number] Collier Bullock Diagnosing Phys: Nelva Bush MD IMPRESSIONS  1. Left ventricular ejection fraction, by estimation, is 45 to 50%. The left ventricle has mildly decreased function. Left ventricular endocardial border not optimally defined to evaluate regional wall motion. There is moderate left ventricular hypertrophy. Left ventricular diastolic parameters are indeterminate.  2. Right ventricular systolic function is normal. The right ventricular size is normal.  3. Left atrial size was mildly dilated.  4. Right atrial size was mildly dilated.  5. The mitral valve is grossly normal. Trivial mitral valve regurgitation. Moderate mitral annular calcification.  6. The aortic valve is tricuspid. There is mild calcification of the aortic valve. There is mild thickening of the aortic valve. Aortic valve regurgitation is not visualized. Mild to moderate aortic valve sclerosis/calcification is present, without any evidence of aortic stenosis.  FINDINGS  Left Ventricle: Left ventricular ejection fraction, by estimation, is 45 to 50%. The left ventricle has mildly decreased function. Left ventricular endocardial border not optimally defined to evaluate regional wall motion. The left ventricular internal cavity size was normal in size. There is moderate left ventricular hypertrophy. Left ventricular diastolic parameters are indeterminate. Right Ventricle: The right ventricular size is normal. No increase in right ventricular wall thickness. Right ventricular systolic function is normal. Left Atrium: Left atrial size was mildly dilated. Right Atrium: Right atrial size was mildly dilated. Pericardium: The pericardium was not well visualized. Mitral Valve: The mitral valve is grossly normal. Moderate mitral annular calcification. Trivial mitral valve regurgitation. Tricuspid Valve: The tricuspid valve is normal in structure. Tricuspid valve regurgitation is mild. Aortic Valve: The aortic valve is tricuspid. There  is mild calcification of the aortic valve. There is mild thickening of the aortic valve. Aortic valve regurgitation is not visualized. Mild to moderate aortic valve sclerosis/calcification is present, without any evidence of aortic stenosis. Aortic valve mean gradient measures 1.0 mmHg. Aortic valve peak gradient measures 2.8 mmHg. Aortic valve area, by VTI measures 3.72 cm. Pulmonic Valve: The pulmonic valve was normal in structure. Pulmonic valve regurgitation is not visualized. No evidence of pulmonic stenosis. Aorta: The aortic root is normal in size and structure. Pulmonary Artery: The pulmonary artery is not well seen. Venous: The inferior vena cava was not well visualized. IAS/Shunts: The interatrial septum was not assessed.  LEFT VENTRICLE PLAX 2D LVIDd:         4.25 cm LVIDs:         3.34 cm LV PW:         1.22 cm LV IVS:        1.35 cm LVOT diam:     2.10 cm LV SV:         51 LV SV Index:   27 LVOT Area:     3.46 cm  LV Volumes (MOD) LV vol  d, MOD A2C: 71.0 ml LV vol d, MOD A4C: 76.6 ml LV vol s, MOD A2C: 36.5 ml LV vol s, MOD A4C: 40.1 ml LV SV MOD A2C:     34.5 ml LV SV MOD A4C:     76.6 ml LV SV MOD BP:      33.7 ml RIGHT VENTRICLE RV S prime:     8.92 cm/s TAPSE (M-mode): 3.8 cm LEFT ATRIUM             Index       RIGHT ATRIUM           Index LA diam:        4.30 cm 2.30 cm/m  RA Area:     23.70 cm LA Vol (A2C):   63.4 ml 33.92 ml/m RA Volume:   73.60 ml  39.38 ml/m LA Vol (A4C):   55.6 ml 29.75 ml/m LA Biplane Vol: 67.5 ml 36.12 ml/m  AORTIC VALVE                   PULMONIC VALVE AV Area (Vmax):    2.66 cm    PV Vmax:        0.87 m/s AV Area (Vmean):   2.81 cm    PV Peak grad:   3.1 mmHg AV Area (VTI):     3.72 cm    RVOT Peak grad: 5 mmHg AV Vmax:           83.20 cm/s AV Vmean:          55.400 cm/s AV VTI:            0.137 m AV Peak Grad:      2.8 mmHg AV Mean Grad:      1.0 mmHg LVOT Vmax:         64.00 cm/s LVOT Vmean:        44.900 cm/s LVOT VTI:          0.147 m LVOT/AV VTI ratio: 1.07  AORTA Ao Root diam: 2.50 cm MITRAL VALVE                TRICUSPID VALVE MV Area (PHT): 4.29 cm     TR Peak grad:   39.9 mmHg MV Decel Time: 177 msec     TR Vmax:  316.00 cm/s MV E velocity: 126.00 cm/s                             SHUNTS                             Systemic VTI:  0.15 m                             Systemic Diam: 2.10 cm Nelva Bush MD Electronically signed by Nelva Bush MD Signature Date/Time: 06/09/2020/7:06:33 PM    Final     Scheduled Meds: . amLODipine  10 mg Oral Daily  . apixaban  5 mg Oral BID  . aspirin EC  81 mg Oral Q0600  . atorvastatin  40 mg Oral q1800  . carvedilol  3.125 mg Oral BID WC  . cholecalciferol  2,000 Units Oral Daily  . insulin aspart  0-15 Units Subcutaneous TID WC  . mycophenolate  750 mg Oral BID  . phenazopyridine  200 mg Oral TID WC  . potassium chloride  20 mEq Oral Daily  . tacrolimus  1 mg Oral BID  . [START ON 05/22/2020] tamsulosin  0.8 mg Oral Daily   Continuous  Infusions: .  ceFAZolin (ANCEF) IV Stopped (05/21/20 1357)   PRN Meds:.ondansetron **OR** ondansetron (ZOFRAN) IV  Assessment & Plan: Pt is a 64 y.o. Hispanic  male with Diabetes Hypertension Atrial fibrillation CABG 2008 Congestive heart failure with LVEF 45% from echo 2019 February Carotid endarterectomy Renal transplant 06/10/2015, deceased donor   was admitted on 05/19/2020 with S/P kidney transplant [Z94.0] Anticoagulant long-term use [Z79.01] Immunosuppression ([place]) [D84.9] QT prolongation [R94.31] Sepsis ([place]) [A41.9] Fever, unspecified fever cause [R50.9] Atrial fibrillation, unspecified type ([place]) [I48.91] Sepsis, due to unspecified organism, unspecified whether acute organ dysfunction present ([place]) [A41.9]    #Acute kidney injury and renal transplant status # High risk medication management Creatinine was 1.5 upon arrival which has improved Lab Results  Component Value Date   CREATININE 1.34 (H) 05/21/2020   CREATININE 1.25 (H) 2020/06/09   CREATININE 1.52 (H) 05/19/2020    Patient has renal transplant from 2015/06/10. Managed with Prograf and CellCept We will continue Prograf and CellCept at home doses Avoid hypotension and volume depletion  #Urinary tract infection Urine culture is growing E. coli Currently being treated with IV cefepime CT abdomen and pelvis noncontrast is negative for renal stone or abscess  Hyponatremia Mild Likely related to renal insufficiency  Hypokalemia Allow liberal potassium diet Continue oral potassium chloride supplementation     LOS: 1 Ailsa Mireles 10/20/20214:01 PM    Note: This note was prepared with Dragon dictation. Any transcription errors are unintentional

## 2020-05-21 NOTE — Progress Notes (Addendum)
Inpatient Diabetes Program Recommendations  AACE/ADA: New Consensus Statement on Inpatient Glycemic Control (2015)  Target Ranges:  Prepandial:   less than 140 mg/dL      Peak postprandial:   less than 180 mg/dL (1-2 hours)      Critically ill patients:  140 - 180 mg/dL   Lab Results  Component Value Date   GLUCAP 227 (H) 05/21/2020   HGBA1C 6.5 (H) 04/27/2020    Review of Glycemic Control Results for ELEK, HOLDERNESS (MRN [id number]) as of 05/21/2020 12:05  Ref. Range 05/20/2020 08:27 05/20/2020 13:35 05/20/2020 17:48 05/20/2020 21:10 05/21/2020 08:02  Glucose-Capillary Latest Ref Range: 70 - 99 mg/dL 124 (H) 169 (H) 251 (H) 164 (H) 227 (H)   Diabetes history: DM2 Outpatient Diabetes medications: Lantus 24 units + Humalog 10-10-up to 12 units tid Current orders for Inpatient glycemic control: Novolog 0-15 units tid  Inpatient Diabetes Program Recommendations:   Consider if CBGs continue > 180: -50% basal insulin = Lantus 12 units daily Secure chat sent to Dr. Posey Pronto.  Thank you, Nani Gasser. Camdan Burdi, RN, MSN, CDE  Diabetes Coordinator Inpatient Glycemic Control Team Team Pager [phone] (8am-5pm) 05/21/2020 12:09 PM

## 2020-05-22 DIAGNOSIS — N390 Urinary tract infection, site not specified: Secondary | ICD-10-CM | POA: Diagnosis not present

## 2020-05-22 DIAGNOSIS — A419 Sepsis, unspecified organism: Secondary | ICD-10-CM | POA: Diagnosis not present

## 2020-05-22 LAB — CBC
HCT: 35.5 % — ABNORMAL LOW (ref 39.0–52.0)
Hemoglobin: 12.3 g/dL — ABNORMAL LOW (ref 13.0–17.0)
MCH: 29.9 pg (ref 26.0–34.0)
MCHC: 34.6 g/dL (ref 30.0–36.0)
MCV: 86.4 fL (ref 80.0–100.0)
Platelets: 169 10*3/uL (ref 150–400)
RBC: 4.11 MIL/uL — ABNORMAL LOW (ref 4.22–5.81)
RDW: 12.7 % (ref 11.5–15.5)
WBC: 6.5 10*3/uL (ref 4.0–10.5)
nRBC: 0 % (ref 0.0–0.2)

## 2020-05-22 LAB — RENAL FUNCTION PANEL
Albumin: 3 g/dL — ABNORMAL LOW (ref 3.5–5.0)
Anion gap: 11 (ref 5–15)
BUN: 19 mg/dL (ref 8–23)
CO2: 24 mmol/L (ref 22–32)
Calcium: 8.2 mg/dL — ABNORMAL LOW (ref 8.9–10.3)
Chloride: 97 mmol/L — ABNORMAL LOW (ref 98–111)
Creatinine, Ser: 1.03 mg/dL (ref 0.61–1.24)
GFR, Estimated: >60 mL/min (ref >60)
Glucose, Bld: 326 mg/dL — ABNORMAL HIGH (ref 70–99)
Phosphorus: 2.2 mg/dL — ABNORMAL LOW (ref 2.5–4.6)
Potassium: 3.2 mmol/L — ABNORMAL LOW (ref 3.5–5.1)
Sodium: 132 mmol/L — ABNORMAL LOW (ref 135–145)

## 2020-05-22 LAB — GLUCOSE, CAPILLARY
Glucose-Capillary: 385 mg/dL — ABNORMAL HIGH (ref 70–99)
Glucose-Capillary: 271 mg/dL — ABNORMAL HIGH (ref 70–99)

## 2020-05-22 LAB — TACROLIMUS LEVEL: Tacrolimus (FK506) - LabCorp: 4.2 ng/mL (ref 2.0–20.0)

## 2020-05-22 MED ORDER — CIPROFLOXACIN HCL 500 MG PO TABS: 500.0000 mg | ORAL_TABLET | Freq: Two times a day (BID) | ORAL | Status: DC

## 2020-05-22 MED ORDER — SACCHAROMYCES BOULARDII 250 MG PO CAPS
250.0000 mg | ORAL_CAPSULE | Freq: Two times a day (BID) | ORAL | 0 refills | Status: DC
Start: 2020-05-22 — End: 2020-09-02

## 2020-05-22 MED ORDER — CIPROFLOXACIN HCL 500 MG PO TABS: 500.0000 mg | ORAL_TABLET | Freq: Two times a day (BID) | ORAL | 0 refills | Status: AC

## 2020-05-22 MED ORDER — INSULIN ASPART 100 UNIT/ML ~~LOC~~ SOLN
6.0000 [IU] | Freq: Three times a day (TID) | SUBCUTANEOUS | Status: DC
  Administered 2020-05-22 (×2): 6 [IU] via SUBCUTANEOUS
  Filled 2020-05-22 (×2): qty 1

## 2020-05-22 MED ORDER — INSULIN GLARGINE 100 UNIT/ML ~~LOC~~ SOLN: 8.0000 [IU] | Freq: Once | SUBCUTANEOUS | Status: DC

## 2020-05-22 MED ORDER — SACCHAROMYCES BOULARDII 250 MG PO CAPS
250.0000 mg | ORAL_CAPSULE | Freq: Two times a day (BID) | ORAL | Status: DC
  Administered 2020-05-22: 250 mg via ORAL
  Filled 2020-05-22 (×2): qty 1

## 2020-05-22 MED ORDER — POTASSIUM CHLORIDE CRYS ER 20 MEQ PO TBCR
40.0000 meq | EXTENDED_RELEASE_TABLET | ORAL | Status: AC
  Administered 2020-05-22 (×2): 40 meq via ORAL
  Filled 2020-05-22 (×2): qty 2

## 2020-05-22 MED ORDER — SULFAMETHOXAZOLE-TRIMETHOPRIM 800-160 MG PO TABS
1.0000 | ORAL_TABLET | Freq: Two times a day (BID) | ORAL | Status: DC
  Administered 2020-05-22: 11:00:00 1 via ORAL
  Filled 2020-05-22 (×2): qty 1

## 2020-05-22 NOTE — Discharge Instructions (Signed)
Fibrilacin auricular Atrial Fibrillation  La fibrilacin auricular es un tipo de latido cardaco irregular o rpido (arritmia). En la fibrilacin auricular, la parte superior del corazn (aurculas) late con un patrn irregular. Esto hace que el corazn no pueda bombear sangre de [place] normal y Armed forces logistics/support/administrative officer. El [place] del tratamiento es prevenir la formacin de cogulos de [place], Chief Technology Officer la frecuencia cardaca o Medical laboratory scientific officer el ritmo normal de los latidos cardacos. Si esta afeccin no se trata, puede causar graves complicaciones, como el debilitamiento del msculo cardaco (miocardiopata) o un accidente cerebrovascular. Cules son las causas? Esta afeccin suele ser causada por otras afecciones que daan el sistema elctrico del corazn. Estas incluyen lo siguiente:  Presin arterial alta (hipertensin arterial). Esta es la causa ms frecuente.  Ciertos problemas o afecciones del corazn, como insuficiencia cardaca, arteriopata coronaria, problemas en las vlvulas cardacas o ciruga cardaca.  Diabetes.  Hiperactividad de la glndula tiroidea (hipertiroidismo).  Obesidad.  Enfermedad renal crnica. En algunos casos, se desconoce la causa de esta afeccin. Qu incrementa el riesgo? Es ms probable que Orthoptist en:  Personas de edad avanzada.  Fumadores.  Deportistas que realizan ejercicios de resistencia.  Las personas que tienen antecedentes familiares de fibrilacin auricular.  Hombres.  Personas que consumen drogas.  Personas que beben mucho alcohol.  Personas con afecciones pulmonares, como enfisema, neumona o EPOC.  Personas que tienen apnea obstructiva del sueo. Cules son los signos o sntomas? Los sntomas de esta afeccin incluyen:  Sensacin de que el corazn late rpida o irregularmente.  Malestar o Tourist information centre manager.  Falta de aire.  Mareos o debilidad repentinos.  Cansarse fcilmente al hacer ejercicio o actividad  fsica.  Fatiga.  Sncope (episodio de desmayo).  Sudoracin. En algunos casos, no hay sntomas. Cmo se diagnostica? El mdico puede detectar la fibrilacin auricular al tomarle el pulso. Si se detecta, esta afeccin podra diagnosticarse a travs de lo siguiente:  Un electrocardiograma (ECG) para controlar las seales elctricas del corazn.  Un monitor cardaco ambulatorio para Financial planner actividad del corazn durante algunos das.  Un ecocardiograma transtorcico (ETT) para obtener imgenes del corazn.  Un ecocardiograma transesofgico (ETE) para obtener imgenes incluso ms cercanas del corazn.  Una ergometra para evaluar la irrigacin sangunea mientras hace ejercicio.  Estudios de diagnstico por imgenes, como una exploracin por tomografa computarizada (TC) o una radiografa torcica.  Anlisis de [place]. Cmo se trata? El tratamiento depende de las afecciones subyacentes y de cmo se siente cuando tiene fibrilacin auricular. El tratamiento de esta afeccin puede incluir:  Medicamentos para prevenir la formacin de cogulos de sangre o tratar los problemas de frecuencia cardaca o ritmo cardaco.  Cardioversin elctrica para restablecer el ritmo cardaco.  Un marcapasos para corregir el ritmo cardaco anormal.  Ablacin para extirpar el tejido cardaco que enva seales anormales.  Cierre de Production manager auricular izquierda para sellar la zona donde pueden formarse cogulos de [place]. En algunos casos, se tratarn las afecciones subyacentes. Siga estas instrucciones en su casa: Medicamentos  Tome los medicamentos de venta libre y los recetados solamente como se lo haya indicado el mdico.  No tome medicamentos nuevos sin antes Teacher, adult education con su mdico.  Si est tomando anticoagulantes, tenga en cuenta lo siguiente: ? Hable con el mdico antes de tomar cualquier medicamento que contenga aspirina o antiinflamatorios no esteroideos (AINE), como el  ibuprofeno. Estos medicamentos aumentan el riesgo de tener una hemorragia peligrosa. ? Tome los medicamentos exactamente como se lo indicaron, [employer] a  la [employer]. ? Evite las actividades que podran causarle lesiones o moretones y [place] cadas. ? Use un brazalete de alerta mdica o lleve una tarjeta con una lista de los medicamentos que toma. Estilo de vida      No consuma ningn producto que contenga nicotina o tabaco, como cigarrillos, cigarrillos electrnicos y tabaco de Higher education careers adviser. Si necesita ayuda para dejar de fumar, consulte al mdico.  Consuma alimentos cardiosaludables. Hable con un nutricionista para crear un plan de alimentacin que sea adecuado para usted.  Haga actividad fsica habitualmente como se lo haya indicado el mdico.  No beba alcohol.  Baje de peso si es necesario.  No consuma drogas, ni siquiera cannabis. Instrucciones generales  Si tiene apnea obstructiva del sueo, controle la afeccin como se lo haya indicado el mdico.  No tome pastillas para adelgazar a menos que el mdico lo autorice. Estas pastillas pueden agravar los problemas cardacos.  Concurra a todas las visitas de seguimiento como se lo haya indicado el mdico. Esto es importante. Comunquese con un mdico si:  Nota un cambio en la frecuencia, el ritmo o la fuerza de los latidos cardacos.  Toma anticoagulantes y observa ms moretones.  Se cansa con ms facilidad cuando hace ejercicio o hace trabajos pesados.  Tiene cambios repentinos en [employer]. Solicite ayuda inmediatamente si tiene:   Dolor de pecho, dolor abdominal, sudoracin o debilidad.  Dificultad para respirar.  Efectos secundarios de los anticoagulantes, como sangre en el vmito, las heces o la [place], o sangrado que no puede detenerse.  Cualquier sntoma de un accidente cerebrovascular. "BE FAST" es una manera fcil de recordar las principales seales de advertencia de un accidente  cerebrovascular: ? B - Balance (equilibrio). Los signos son mareos, dificultad repentina para caminar o prdida del equilibrio. ? E - Eyes (ojos). Los signos son problemas para ver o un cambio repentino en la visin. ? F - Face (rostro). Los signos son debilidad repentina o adormecimiento del rostro, o el rostro o el prpado que se caen hacia un lado. ? A - Arms (brazos). Los signos son debilidad o adormecimiento en un brazo. Esto sucede de repente y generalmente en un lado del cuerpo. ? S - Speech (habla). Los signos son dificultad para hablar, hablar arrastrando las palabras o dificultad para comprender lo que la gente dice. ? T - Time (tiempo). Es tiempo de llamar al servicio de Multimedia programmer. Anote la hora a la que [employer] sntomas.  Otros signos de accidente cerebrovascular, tales como: ? Dolor de cabeza sbito e intenso que no tiene causa aparente. ? Nuseas o vmitos. ? Convulsiones. Estos sntomas pueden representar un problema grave que constituye Engineer, maintenance (IT). No espere a ver si los sntomas desaparecen. Solicite atencin mdica de inmediato. Comunquese con el servicio de emergencias de su localidad (911 en los [place]). No conduzca por sus propios medios Principal Financial. Resumen  La fibrilacin auricular es un tipo de latido cardaco irregular o rpido (arritmia).  Los sntomas incluyen sensacin de que el corazn late rpida o irregularmente.  Es posible que le den medicamentos para prevenir la formacin de cogulos de sangre o tratar los problemas de frecuencia cardaca o ritmo cardaco.  Obtenga ayuda de inmediato si tiene signos o sntomas de un accidente cerebrovascular.  Busque ayuda de inmediato si no puede respirar o siente dolor o presin en el pecho. Esta informacin no tiene Marine scientist el consejo del mdico. Asegrese de hacerle al mdico  cualquier pregunta que tenga. Document Revised: 03/06/2019 Document Reviewed: 03/06/2019 Elsevier Patient  Education  [place] auricular Atrial Fibrillation  La fibrilacin auricular es un tipo de latido cardaco irregular o rpido (arritmia). En la fibrilacin auricular, la parte superior del corazn (aurculas) late con un patrn irregular. Esto hace que el corazn no pueda bombear sangre de [place] normal y Armed forces logistics/support/administrative officer. El [place] del tratamiento es prevenir la formacin de cogulos de [place], Chief Technology Officer la frecuencia cardaca o Medical laboratory scientific officer el ritmo normal de los latidos cardacos. Si esta afeccin no se trata, puede causar graves complicaciones, como el debilitamiento del msculo cardaco (miocardiopata) o un accidente cerebrovascular. Cules son las causas? Esta afeccin suele ser causada por otras afecciones que daan el sistema elctrico del corazn. Estas incluyen lo siguiente:  Presin arterial alta (hipertensin arterial). Esta es la causa ms frecuente.  Ciertos problemas o afecciones del corazn, como insuficiencia cardaca, arteriopata coronaria, problemas en las vlvulas cardacas o ciruga cardaca.  Diabetes.  Hiperactividad de la glndula tiroidea (hipertiroidismo).  Obesidad.  Enfermedad renal crnica. En algunos casos, se desconoce la causa de esta afeccin. Qu incrementa el riesgo? Es ms probable que Orthoptist en:  Personas de edad avanzada.  Fumadores.  Deportistas que realizan ejercicios de resistencia.  Las personas que tienen antecedentes familiares de fibrilacin auricular.  Hombres.  Personas que consumen drogas.  Personas que beben mucho alcohol.  Personas con afecciones pulmonares, como enfisema, neumona o EPOC.  Personas que tienen apnea obstructiva del sueo. Cules son los signos o sntomas? Los sntomas de esta afeccin incluyen:  Sensacin de que el corazn late rpida o irregularmente.  Malestar o Tourist information centre manager.  Falta de aire.  Mareos o debilidad repentinos.  Cansarse fcilmente al  hacer ejercicio o actividad fsica.  Fatiga.  Sncope (episodio de desmayo).  Sudoracin. En algunos casos, no hay sntomas. Cmo se diagnostica? El mdico puede detectar la fibrilacin auricular al tomarle el pulso. Si se detecta, esta afeccin podra diagnosticarse a travs de lo siguiente:  Un electrocardiograma (ECG) para controlar las seales elctricas del corazn.  Un monitor cardaco ambulatorio para Financial planner actividad del corazn durante algunos das.  Un ecocardiograma transtorcico (ETT) para obtener imgenes del corazn.  Un ecocardiograma transesofgico (ETE) para obtener imgenes incluso ms cercanas del corazn.  Una ergometra para evaluar la irrigacin sangunea mientras hace ejercicio.  Estudios de diagnstico por imgenes, como una exploracin por tomografa computarizada (TC) o una radiografa torcica.  Anlisis de [place]. Cmo se trata? El tratamiento depende de las afecciones subyacentes y de cmo se siente cuando tiene fibrilacin auricular. El tratamiento de esta afeccin puede incluir:  Medicamentos para prevenir la formacin de cogulos de sangre o tratar los problemas de frecuencia cardaca o ritmo cardaco.  Cardioversin elctrica para restablecer el ritmo cardaco.  Un marcapasos para corregir el ritmo cardaco anormal.  Ablacin para extirpar el tejido cardaco que enva seales anormales.  Cierre de Production manager auricular izquierda para sellar la zona donde pueden formarse cogulos de [place]. En algunos casos, se tratarn las afecciones subyacentes. Siga estas instrucciones en su casa: Medicamentos  Tome los medicamentos de venta libre y los recetados solamente como se lo haya indicado el mdico.  No tome medicamentos nuevos sin antes Teacher, adult education con su mdico.  Si est tomando anticoagulantes, tenga en cuenta lo siguiente: ? Hable con el mdico antes de tomar cualquier medicamento que contenga aspirina o antiinflamatorios no  esteroideos (AINE), como el ibuprofeno. Estos medicamentos aumentan  el riesgo de tener una hemorragia peligrosa. ? Tome los [employer] se lo indicaron, [employer] a la misma hora. ? Evite las actividades que podran causarle lesiones o moretones y [place] cadas. ? Use un brazalete de alerta mdica o lleve una tarjeta con una lista de los medicamentos que toma. Estilo de vida      No consuma ningn producto que contenga nicotina o tabaco, como cigarrillos, cigarrillos electrnicos y tabaco de Higher education careers adviser. Si necesita ayuda para dejar de fumar, consulte al mdico.  Consuma alimentos cardiosaludables. Hable con un nutricionista para crear un plan de alimentacin que sea adecuado para usted.  Haga actividad fsica habitualmente como se lo haya indicado el mdico.  No beba alcohol.  Baje de peso si es necesario.  No consuma drogas, ni siquiera cannabis. Instrucciones generales  Si tiene apnea obstructiva del sueo, controle la afeccin como se lo haya indicado el mdico.  No tome pastillas para adelgazar a menos que el mdico lo autorice. Estas pastillas pueden agravar los problemas cardacos.  Concurra a todas las visitas de seguimiento como se lo haya indicado el mdico. Esto es importante. Comunquese con un mdico si:  Nota un cambio en la frecuencia, el ritmo o la fuerza de los latidos cardacos.  Toma anticoagulantes y observa ms moretones.  Se cansa con ms facilidad cuando hace ejercicio o hace trabajos pesados.  Tiene cambios repentinos en [employer]. Solicite ayuda inmediatamente si tiene:   Dolor de pecho, dolor abdominal, sudoracin o debilidad.  Dificultad para respirar.  Efectos secundarios de los anticoagulantes, como sangre en el vmito, las heces o la [place], o sangrado que no puede detenerse.  Cualquier sntoma de un accidente cerebrovascular. "BE FAST" es una manera fcil de recordar las principales seales de  advertencia de un accidente cerebrovascular: ? B - Balance (equilibrio). Los signos son mareos, dificultad repentina para caminar o prdida del equilibrio. ? E - Eyes (ojos). Los signos son problemas para ver o un cambio repentino en la visin. ? F - Face (rostro). Los signos son debilidad repentina o adormecimiento del rostro, o el rostro o el prpado que se caen hacia un lado. ? A - Arms (brazos). Los signos son debilidad o adormecimiento en un brazo. Esto sucede de repente y generalmente en un lado del cuerpo. ? S - Speech (habla). Los signos son dificultad para hablar, hablar arrastrando las palabras o dificultad para comprender lo que la gente dice. ? T - Time (tiempo). Es tiempo de llamar al servicio de Multimedia programmer. Anote la hora a la que [employer] sntomas.  Otros signos de accidente cerebrovascular, tales como: ? Dolor de cabeza sbito e intenso que no tiene causa aparente. ? Nuseas o vmitos. ? Convulsiones. Estos sntomas pueden representar un problema grave que constituye Engineer, maintenance (IT). No espere a ver si los sntomas desaparecen. Solicite atencin mdica de inmediato. Comunquese con el servicio de emergencias de su localidad (911 en los [place]). No conduzca por sus propios medios Principal Financial. Resumen  La fibrilacin auricular es un tipo de latido cardaco irregular o rpido (arritmia).  Los sntomas incluyen sensacin de que el corazn late rpida o irregularmente.  Es posible que le den medicamentos para prevenir la formacin de cogulos de sangre o tratar los problemas de frecuencia cardaca o ritmo cardaco.  Obtenga ayuda de inmediato si tiene signos o sntomas de un accidente cerebrovascular.  Busque ayuda de inmediato si no puede respirar o Tree surgeon o  presin en el pecho. Esta informacin no tiene Marine scientist el consejo del mdico. Asegrese de hacerle al mdico cualquier pregunta que tenga. Document Revised: 03/06/2019 Document Reviewed:  03/06/2019 Elsevier Patient Education  [place] y el corazn Aspirin and Your Heart  La aspirina es un medicamento que evita que las clulas de la sangre que se usan para la coagulacin, denominadas plaquetas, se peguen. Puede utilizarse para reducir Catering manager de cogulos sanguneos, ataques cardacos y otros problemas relacionados con el corazn. Puedo tomar aspirina? El mdico lo ayudar a Teacher, adult education si es seguro y beneficioso para usted tomar una aspirina a diario. Tomar una aspirina a diario puede ayudarlo si usted:  Tuvo un infarto de miocardio o dolor de pecho.  Tiene riesgo de sufrir un infarto de miocardio.  Le realizaron una ciruga a corazn abierto, como ciruga de injerto de revascularizacin arterial coronaria (IRAC).  Le realizaron una angioplastia coronaria o le colocaron un stent.  Ha tenido ciertos tipos de accidente cerebrovascular o un accidente isqumico transitorio (AIT).  Tiene enfermedad arterial perifrica (EAP).  Tiene problemas de ritmo cardaco crnicos, como fibrilacin auricular y no puede tomar un anticoagulante.  Tiene una enfermedad de las vlvulas cardacas o ha tenido [place] en una vlvula. Cules son los riesgos? El consumo diario de aspirina puede causar efectos secundarios. Entre ellos se incluyen los siguientes:  Hemorragia. Problemas de hemorragias, que pueden ser [employer] o graves. Por ejemplo, un problema menor es un corte que no deja de Therapist, art; uno ms grave es Air cabin crew o, en casos poco frecuentes, una hemorragia cerebral. El riesgo de hemorragias aumenta si tambin toma frmacos antiinflamatorios no esteroideos (AINE).  Aumento de la formacin de hematomas.  Malestar estomacal.  Una reaccin alrgica. Las personas con plipos nasales corren un riesgo mayor de tener alergia a la aspirina. [place] generales  Tome aspirina nicamente como se lo haya indicado el mdico. Asegrese de que  comprende cunto y cul presentacin debe tomar. Las dos presentaciones de la aspirina son: ? No gastrorresistente.Este tipo de aspirina no es [place] y se absorbe rpidamente. Este tipo de aspirina tambin est disponible como comprimido masticable. ? Gastrorresistente. Este tipo de aspirina tiene un recubrimiento que libera el medicamento muy lentamente. La aspirina gastrorresistente podra causar menos malestar estomacal en comparacin con la opcin que no es Technical sales engineer. Este tipo de aspirina no debe masticarse ni triturarse.  Limite el consumo de alcohol a no ms de 22medida por da si es mujer y no est [place], y a 90medidas por da si es hombre. Beber alcohol aumenta el riesgo de Art gallery manager. Una medida equivale a 12oz (330ml) de cerveza, 5oz (160ml) de vino o 1oz (85ml) de bebidas alcohlicas de alta graduacin. Comunquese con un mdico si:  Tiene hemorragias o moretones inusuales.  Tiene dolor de estmago o nuseas.  Tiene zumbidos en los odos.  Tiene una reaccin alrgica que causa: ? [place]. ? Picazn en la piel. ? Hinchazn de los labios, la lengua o el rostro. Solicite ayuda inmediatamente si:  Nota que su materia fecal tiene [place], es de color rojo oscuro o negro.  Vomita o tose con sangre.  Tiene sangre en la orina.  Tose, tiene una respiracin ruidosa (sibilancias) o le falta el aire.  Siente dolor en el pecho, especialmente si el dolor se extiende [employer], la espalda, el cuello o la [place].  Tiene un dolor de cabeza intenso, o presenta confusin o mareos junto con el  dolor de cabeza. Estos sntomas pueden representar un problema grave que constituye Engineer, maintenance (IT). No espere hasta que los sntomas desaparezcan. Solicite atencin mdica de inmediato. Comunquese con el servicio de emergencias de su localidad (911 en los [place]). No conduzca por sus propios medios Principal Financial. Resumen  Puede utilizarse para  reducir el riesgo de cogulos sanguneos, ataques cardacos y otros problemas relacionados con el corazn.  El consumo diario de aspirina puede aumentar el riesgo de efectos secundarios. El mdico lo ayudar a Teacher, adult education si es seguro y beneficioso para usted tomar una aspirina a diario.  Tome aspirina nicamente como se lo haya indicado el mdico. Asegrese de que comprende cunto y cul presentacin puede tomar. Esta informacin no tiene Marine scientist el consejo del mdico. Asegrese de hacerle al mdico cualquier pregunta que tenga. Document Revised: 10/28/2017 Document Reviewed: 07/11/2017 Elsevier Patient Education  [place].

## 2020-05-22 NOTE — Consult Note (Signed)
Pharmacy Antibiotic Note  [name] is a 64 y.o. male admitted on 81/82/9937 with complicated UTI.   Pharmacy has been consulted for ciprofloxacin dosing.  CC painful urination, fever. WBC 15.4 on admission. Urinalysis shows pyuria. PMH dx UTI on 9/26 (>100,000 E. Coli pan-sensitive), ESRD s/p right renal transplant, paroxysmal AF, HTN, CAD, DM.    Pt unsuccessfully treated with omnicef prior to admission despite current sensitivity results.  Plan: Will start ciprofloxacin 500mg  bid  Height: 5\' 8"  (172.7 cm) Weight: 73.5 kg (162 lb 0.6 oz) IBW/kg (Calculated) : 68.4  Temp (24hrs), Avg:98.3 F (36.8 C), Min:97.8 F (36.6 C), Max:98.7 F (37.1 C)  Recent Labs  Lab 05/19/20 0709 05/19/20 0709 05/19/20 1329 05/20/20 0425 05/20/20 0601 05/20/20 0716 05/20/20 1145 05/21/20 0532 05/22/20 0515  WBC 15.4*  --   --   --   --  12.6*  --  8.4 6.5  CREATININE 1.52*  --   --   --   --  1.25*  --  1.34* 1.03  LATICACIDVEN 1.3   < > 2.1* 3.5* 4.5* 1.2 1.4  --   --    < > = values in this interval not displayed.    Estimated Creatinine Clearance: 69.2 mL/min (by C-G formula based on SCr of 1.03 mg/dL).    No Known Allergies  Antimicrobials this admission: 10/18 ceftriaxone >> 10/19 10/19 cefepime >> 10/20 10/20 cefazolin >>10/21 Bactrim 10/21 x 1 10/21 cipro >>   Microbiology results: 10/18 BCx: NGTD 10/18 UCx: >100,000 E. Coli - pan-sensitive  Thank you for allowing pharmacy to be a part of this patient's care.  Lu Duffel, PharmD, BCPS Clinical Pharmacist 05/22/2020 11:19 AM

## 2020-05-22 NOTE — Progress Notes (Signed)
[facility]  ROUNDING NOTE   Subjective:   [name] presented to the Emergency Department with dysuria and chills x 1 day.Recent UTI with  E Coli in September,treated with antibiotics.Patient has renal transplant in October 2016 Patient got consulted to Nephrology in concerns of elevated creatinine from his baseline.Patient reports ongoing urinary retention , otherwise no complaints today.Awaiting discharge.    Baseline creatinine is 1.2-1.4 Presenting Creatinine 1.52 Improved to 1.03 today  Objective:  Vital signs in last 24 hours:  Temp:  [97.6 F (36.4 C)-98.7 F (37.1 C)] 97.6 F (36.4 C) (10/21 1132) Pulse Rate:  [75-90] 84 (10/21 1132) Resp:  [14-20] 16 (10/21 1132) BP: (119-157)/(56-72) 119/67 (10/21 1132) SpO2:  [89 %-99 %] 99 % (10/21 1132)  Weight change:  Filed Weights   05/19/20 0645 05/19/20 0705  Weight: 73.5 kg 73.5 kg    Intake/Output: I/O last 3 completed shifts: In: 600.4 [P.O.:280; IV Piggyback:320.4] Out: 1300 [Urine:1300]   Intake/Output this shift:  Total I/O In: 240 [P.O.:240] Out: -   Physical Exam: General: No acute distress  Head: Normocephalic, atraumatic. Moist oral mucosal membranes  Eyes: Anicteric  Neck: Supple, trachea midline  Lungs:  Clear to auscultation,normal respiratory effort  Heart: Regular rate and rhythm  Abdomen:  Soft, nontender,   Extremities:  No  peripheral edema.  Neurologic: Nonfocal, moving all four extremities  Skin: No lesions or rashes    Basic Metabolic Panel: Recent Labs  Lab 05/19/20 0709 05/19/20 0709 05/20/20 0716 05/21/20 0532 05/22/20 0515  NA 132*  --  130* 131* 132*  K 3.9  --  3.3* 2.9* 3.2*  CL 97*  --  97* 96* 97*  CO2 22  --  23 24 24   GLUCOSE 209*  --  143* 223* 326*  BUN 30*  --  25* 22 19  CREATININE 1.52*  --  1.25* 1.34* 1.03  CALCIUM 8.6*   < > 8.0* 7.8* 8.2*  PHOS  --   --   --   --  2.2*   < > = values in this interval not displayed.    Liver Function  Tests: Recent Labs  Lab 05/19/20 0709 05/22/20 0515  AST 22  --   ALT 20  --   ALKPHOS 44  --   BILITOT 1.9*  --   PROT 7.3  --   ALBUMIN 4.0 3.0*   No results for input(s): LIPASE, AMYLASE in the last 168 hours. No results for input(s): AMMONIA in the last 168 hours.  CBC: Recent Labs  Lab 05/19/20 0709 05/20/20 0716 05/21/20 0532 05/22/20 0515  WBC 15.4* 12.6* 8.4 6.5  HGB 13.5 11.8* 11.2* 12.3*  HCT 38.9* 34.2* 32.8* 35.5*  MCV 86.6 87.0 87.7 86.4  PLT 218 151 144* 169    Cardiac Enzymes: No results for input(s): CKTOTAL, CKMB, CKMBINDEX, TROPONINI in the last 168 hours.  BNP: Invalid input(s): POCBNP  CBG: Recent Labs  Lab 05/21/20 1225 05/21/20 1610 05/21/20 2116 05/22/20 0742 05/22/20 1206  GLUCAP 275* 244* 257* 385* 271*    Microbiology: Results for orders placed or performed during the hospital encounter of 05/19/20  Urine Culture     Status: Abnormal   Collection Time: 05/19/20  6:46 AM   Specimen: Urine, Random  Result Value Ref Range Status   Specimen Description   Final    URINE, RANDOM Performed at [facility], [address] [place], [place] [address]    Special Requests   Final  NONE Performed at [facility], [address] [place], [place] [address]    Culture >=100,000 COLONIES/mL ESCHERICHIA COLI (A)  Final   Report Status 05/21/2020 FINAL  Final   Organism ID, Bacteria ESCHERICHIA COLI (A)  Final      Susceptibility   Escherichia coli - MIC*    AMPICILLIN <=2 SENSITIVE Sensitive     CEFAZOLIN <=4 SENSITIVE Sensitive     CEFTRIAXONE <=0.25 SENSITIVE Sensitive     CIPROFLOXACIN <=0.25 SENSITIVE Sensitive     GENTAMICIN <=1 SENSITIVE Sensitive     IMIPENEM <=0.25 SENSITIVE Sensitive     NITROFURANTOIN <=16 SENSITIVE Sensitive     TRIMETH/SULFA <=20 SENSITIVE Sensitive     AMPICILLIN/SULBACTAM <=2 SENSITIVE Sensitive     PIP/TAZO <=4 SENSITIVE Sensitive     * >=100,000 COLONIES/mL ESCHERICHIA COLI   Blood culture (routine x 2)     Status: None (Preliminary result)   Collection Time: 05/19/20 12:19 PM   Specimen: BLOOD  Result Value Ref Range Status   Specimen Description BLOOD BLOOD LEFT HAND  Final   Special Requests   Final    BOTTLES DRAWN AEROBIC AND ANAEROBIC Blood Culture results may not be optimal due to an inadequate volume of blood received in culture bottles   Culture   Final    NO GROWTH 3 DAYS Performed at [facility], [address], [place], [place] [address]    Report Status PENDING  Incomplete  Respiratory Panel by RT PCR (Flu A&B, Covid) - Nasopharyngeal Swab     Status: None   Collection Time: 05/19/20  1:29 PM   Specimen: Nasopharyngeal Swab  Result Value Ref Range Status   SARS Coronavirus 2 by RT PCR NEGATIVE NEGATIVE Final    Comment: (NOTE) SARS-CoV-2 target nucleic acids are NOT DETECTED.  The SARS-CoV-2 RNA is generally detectable in upper respiratoy specimens during the acute phase of infection. The lowest concentration of SARS-CoV-2 viral copies this assay can detect is 131 copies/mL. A negative result does not preclude SARS-Cov-2 infection and should not be used as the sole basis for treatment or other patient management decisions. A negative result may occur with  improper specimen collection/handling, submission of specimen other than nasopharyngeal swab, presence of viral mutation(s) within the areas targeted by this assay, and inadequate number of viral copies (<131 copies/mL). A negative result must be combined with clinical observations, patient history, and epidemiological information. The expected result is Negative.  Fact Sheet for Patients:  PinkCheek.be  Fact Sheet for Healthcare Providers:  GravelBags.it  This test is no t yet approved or cleared by the [place] FDA and  has been authorized for detection and/or diagnosis of SARS-CoV-2 by FDA under an  Emergency Use Authorization (EUA). This EUA will remain  in effect (meaning this test can be used) for the duration of the COVID-19 declaration under Section 564(b)(1) of the Act, 21 U.S.C. section 360bbb-3(b)(1), unless the authorization is terminated or revoked sooner.     Influenza A by PCR NEGATIVE NEGATIVE Final   Influenza B by PCR NEGATIVE NEGATIVE Final    Comment: (NOTE) The Xpert Xpress SARS-CoV-2/FLU/RSV assay is intended as an aid in  the diagnosis of influenza from Nasopharyngeal swab specimens and  should not be used as a sole basis for treatment. Nasal washings and  aspirates are unacceptable for Xpert Xpress SARS-CoV-2/FLU/RSV  testing.  Fact Sheet for Patients: PinkCheek.be  Fact Sheet for Healthcare Providers: GravelBags.it  This test is not yet approved or cleared  by the [place] and  has been authorized for detection and/or diagnosis of SARS-CoV-2 by  FDA under an Emergency Use Authorization (EUA). This EUA will remain  in effect (meaning this test can be used) for the duration of the  Covid-19 declaration under Section 564(b)(1) of the Act, 21  U.S.C. section 360bbb-3(b)(1), unless the authorization is  terminated or revoked. Performed at [facility], [address] [place], [place] [address]   Blood culture (routine x 2)     Status: None (Preliminary result)   Collection Time: 05/19/20  1:29 PM   Specimen: BLOOD  Result Value Ref Range Status   Specimen Description BLOOD BLOOD RIGHT HAND  Final   Special Requests   Final    BOTTLES DRAWN AEROBIC AND ANAEROBIC Blood Culture adequate volume   Culture   Final    NO GROWTH 3 DAYS Performed at [facility], [address], [place], [place] [address]    Report Status PENDING  Incomplete    Coagulation Studies: Recent Labs    05/21/20 0532  LABPROT 19.2*  INR 1.7*    Urinalysis: No results for input(s):  COLORURINE, LABSPEC, PHURINE, GLUCOSEU, HGBUR, BILIRUBINUR, KETONESUR, PROTEINUR, UROBILINOGEN, NITRITE, LEUKOCYTESUR in the last 72 hours.  Invalid input(s): APPERANCEUR    Imaging: No results found.   Medications:    . amLODipine  10 mg Oral Daily  . apixaban  5 mg Oral BID  . aspirin EC  81 mg Oral Q0600  . atorvastatin  40 mg Oral q1800  . carvedilol  3.125 mg Oral BID WC  . cholecalciferol  2,000 Units Oral Daily  . ciprofloxacin  500 mg Oral BID  . insulin aspart  0-15 Units Subcutaneous TID WC  . insulin aspart  6 Units Subcutaneous TID WC  . mycophenolate  750 mg Oral BID  . phenazopyridine  200 mg Oral TID WC  . saccharomyces boulardii  250 mg Oral BID  . tacrolimus  1 mg Oral BID  . tamsulosin  0.8 mg Oral Daily   ondansetron **OR** ondansetron (ZOFRAN) IV  Assessment/ Plan:  [name] is a 64 y.o.  male presented to the Emergency Department with dysuria and chills x 1 day.Recent UTI with  E Coli in September,treated with antibiotics.  #Acute kidney injury and renal transplant status # High risk medication management H/o Renal Transplant in 2016 Lab Results  Component Value Date   CREATININE 1.03 05/22/2020   CREATININE 1.34 (H) 05/21/2020   CREATININE 1.25 (H) 05/20/2020   Baseline creatinine is 1.2-1.4 Presenting Creatinine 1.52 Improved to 1.03 today  #Urinary tract infection Urine culture is growing E. Coli CT abdomen and pelvis noncontrast is negative for renal stone or abscess Ciprofloxacin 500 mg BID   # Hyponatremia Sodium 132 today Continue monitoring  #Hypokalemia Potassium 3.2  On Potassium oral supplements   LOS: 2 Tyee Vandevoorde 10/21/20212:44 PM

## 2020-05-22 NOTE — Progress Notes (Signed)
Inpatient Diabetes Program Recommendations  AACE/ADA: New Consensus Statement on Inpatient Glycemic Control (2015)  Target Ranges:  Prepandial:   less than 140 mg/dL      Peak postprandial:   less than 180 mg/dL (1-2 hours)      Critically ill patients:  140 - 180 mg/dL   Lab Results  Component Value Date   GLUCAP 385 (H) 05/22/2020   HGBA1C 6.5 (H) 04/27/2020    Review of Glycemic Control Results for CORLISS, LAMARTINA (MRN [id number]) as of 05/22/2020 08:00  Ref. Range 05/21/2020 08:02 05/21/2020 12:25 05/21/2020 16:10 05/21/2020 21:16 05/22/2020 07:42  Glucose-Capillary Latest Ref Range: 70 - 99 mg/dL 227 (H) 275 (H) 244 (H) 257 (H) 385 (H)   Diabetes history: DM2 Outpatient Diabetes medications: Lantus 24 units + Humalog 10-10-up to 12 units tid Current orders for Inpatient glycemic control: Novolog 0-15 units tid  Inpatient Diabetes Program Recommendations:   -50% basal insulin = Lantus 12 units daily -Novolog 4 units tid meal coverage if eats 50% Secure chat sent to Dr. Posey Pronto.  Thank you, Nani Gasser. Shelie Lansing, RN, MSN, CDE  Diabetes Coordinator Inpatient Glycemic Control Team Team Pager [phone] (8am-5pm) 05/22/2020 8:02 AM

## 2020-05-24 LAB — CULTURE, BLOOD (ROUTINE X 2)
Culture: NO GROWTH
Culture: NO GROWTH
Special Requests: ADEQUATE

## 2020-05-24 NOTE — Discharge Summary (Signed)
[facility] Discharge Summary   Patient: [name] WCB:[id number]  PCP: Frazier Richards, MD  Date of admission: 05/19/2020   Date of discharge: 05/22/2020     Discharge Diagnoses:  Principal Problem:   Sepsis secondary to UTI [facility]) Active Problems:   Diabetes [facility])   Essential hypertension   Coronary artery disease   Paroxysmal atrial fibrillation ([place])  Admitted From: home Disposition:  Home   Recommendations for Outpatient Follow-up:  1. PCP: Follow-up with PCP in 1 week. 2. Already has appointment with urology next week 3. Follow up LABS/TEST:  none   Follow-up Information    Frazier Richards, MD. Go on 06/04/2020.   Specialty: Family Medicine Why: at 3:40pm Contact information: [place] [address] [phone]              Diet recommendation: Cardiac diet  Activity: The patient is advised to gradually reintroduce usual activities, as tolerated  Discharge Condition: stable  Code Status: Full code   History of present illness: As per the H and P dictated on admission, "[name] is a 64 y.o. male with medical history significant for diabetes mellitus, end-stage renal disease status post right renal transplant, hypertension, coronary artery disease, paroxysmal atrial fibrillation on anticoagulation therapy who presents to the emergency room for evaluation of a 1 day history of dysuria and chills.  Patient was admitted to the hospital 09/26 to 09/28 for similar symptoms and at that time had a urine culture that yielded greater than 100,000 colony units of E. coli sensitive to cephalosporins.  Patient was seen in consultation during that hospitalization by urology who recommended to treat patient with antibiotics for 14 days.  Patient was compliant with prescribed antibiotic therapy and completed the dose the day prior to his hospitalization.  He complains of suprapubic pain and poor urine stream but denies feeling like he is  unable to void completely.  Patient has a cough which started over the last 24 hours but denies having any chest pain, no shortness of breath, no nausea, no vomiting or any changes in his bowel habits. Upon arrival to the ER he had a fever with a T-max of 103 F, tachycardia with heart rate of 105, tachypnea respiratory rate of 21 Labs show sodium of 130, potassium 3.3, chloride 97, bicarb 23, glucose 143, BUN 25, creatinine 1.25, calcium 8.0, lactic acid 4.5 >> 1.2, white count 15.4 >> 12.6, hemoglobin 11.8, hematocrit 34.2, MCV 87, RDW 12.9, platelet count 151 Urine analysis shows pyuria CT scan of abdomen and pelvis shows no acute findings within the abdomen or pelvis.  Unremarkable appearance of right iliac fossa renal transplant.  No evidence of hydronephrosis or perinephric fluid collection. Chest x-ray reviewed by me shows prior CABG.  Cardiomegaly with pulmonary venous congestion and bilateral interstitial prominence suggesting CHF.  Pneumonitis cannot be excluded. Twelve-lead EKG reviewed by me shows atrial flutter with PVCs and diffuse T wave inversions"  Hospital Course:  Summary of his active problems in the hospital is as following. Sepsis secondary to E. coli UTI(POA) As evidenced by fever with a T-max of103 F,tachycardia, tachypnea, elevated lactic acid level of4.5,mild leukocytosis with a left shift and pyuria. Most recent urine culture UTI E. Coli. Patient received sepsis fluids bolus in the ER Continue antibiotic therapy Initially on IV cefepime.  Later on IV cefazolin.  We will continue oral Cipro on discharge.  Type II diabetes mellitus Maintain consistent carbohydrate diet Continue home regimen  Paroxysmal atrial  fibrillation Continue carvedilol for rate control Continue apixaban as primary prophylaxis for an acute stroke  Status post renal transplant Continue mycophenolate and tacrolimus Appreciate nephrology consult.  Hypertension Continue amlodipine and  carvedilol  History of coronary artery disease Continue aspirin, statins and beta-blockers  Chronic systolic CHF Does not appear volume overloaded. Last known 2D echocardiogram showed an LVEF of 45% with diffuse hypokinesisfrom 2019 Unchanged repeat echocardiogram. Continue carvedilol  BPH Continue Flomax Likely contributing to patient's recurrent UTI. Outpatient follow-up with urology.  Patient was ambulatory without any assistance. On the day of the discharge the patient's vitals were stable, and no other acute medical condition were reported by patient. The patient was felt safe to be discharge at Home with no therapy needed on discharge.  Consultants: Nephrology Procedures: none  Discharge Exam: General: Appear in no distress, no Rash; Oral Mucosa Clear, moist. no Abnormal Neck Mass Or lumps, Conjunctiva normal  Cardiovascular: S1 and S2 Present, no Murmur Respiratory: good respiratory effort, Bilateral Air entry present and CTA, no Crackles, no wheezes Abdomen: Bowel Sound present, Soft and no tenderness Extremities: no Pedal edema Neurology: alert and oriented to time, place, and person affect appropriate. no new focal deficit  Filed Weights   05/19/20 0645 05/19/20 0705  Weight: 73.5 kg 73.5 kg   Vitals:   05/22/20 0759 05/22/20 1132  BP: 123/63 119/67  Pulse: 90 84  Resp: 16 16  Temp: 97.8 F (36.6 C) 97.6 F (36.4 C)  SpO2: 98% 99%    DISCHARGE MEDICATION: Allergies as of 05/22/2020   No Known Allergies     Medication List    STOP taking these medications   cefdinir 300 MG capsule Commonly known as: OMNICEF   chlorthalidone 25 MG tablet Commonly known as: HYGROTON   losartan 25 MG tablet Commonly known as: COZAAR     TAKE these medications   Advocate Insulin Pen Needles 33G X 4 MM Misc Generic drug: Insulin Pen Needle To use with insulin pen 4 times per day. E 13.9   amLODipine 10 MG tablet Commonly known as: NORVASC Take 10 mg by  mouth daily.   aspirin 81 MG EC tablet Take 1 tablet (81 mg total) by mouth daily at 6 (six) AM.   atorvastatin 40 MG tablet Commonly known as: LIPITOR 40 mg daily at 6 PM.   carvedilol 12.5 MG tablet Commonly known as: COREG Take 12.5 mg by mouth at bedtime.   CellCept 250 MG capsule Generic drug: mycophenolate Take 750 mg by mouth 2 (two) times daily.   ciprofloxacin 500 MG tablet Commonly known as: Cipro Take 1 tablet (500 mg total) by mouth 2 (two) times daily for 6 days.   [employer]S Vitamin C 500 MG tablet Generic drug: ascorbic acid Take 500 mg by mouth daily.   Eliquis 5 MG Tabs tablet Generic drug: apixaban Take 5 mg by mouth 2 (two) times daily.   HumaLOG KwikPen 100 UNIT/ML KwikPen Generic drug: insulin lispro Inject 10-12 Units into the skin 3 (three) times daily. Inject 10u daily at breakfast, 10u at lunch and up to 12u at supper per SSI   Lantus SoloStar 100 UNIT/ML Solostar Pen Generic drug: insulin glargine Inject 24 Units into the skin at bedtime.   linagliptin 5 MG Tabs tablet Commonly known as: TRADJENTA Take 5 mg by mouth daily.   omeprazole 20 MG capsule Commonly known as: PRILOSEC Take 20 mg by mouth daily as needed.   phenazopyridine 200 MG tablet Commonly known as: PYRIDIUM Take  1 tablet (200 mg total) by mouth 3 (three) times daily with meals.   Prograf 1 MG capsule Generic drug: tacrolimus Take 1 mg by mouth 2 (two) times daily.   saccharomyces boulardii 250 MG capsule Commonly known as: FLORASTOR Take 1 capsule (250 mg total) by mouth 2 (two) times daily.   tamsulosin 0.4 MG Caps capsule Commonly known as: FLOMAX Take 0.4 mg by mouth 2 (two) times daily.   Vitamin D3 50 MCG (2000 UT) capsule Take 2,000 Units by mouth daily.      No Known Allergies Discharge Instructions    Diet - low sodium heart healthy   Complete by: As directed    Increase activity slowly   Complete by: As directed       The results of significant  diagnostics from this hospitalization (including imaging, microbiology, ancillary and laboratory) are listed below for reference.    Significant Diagnostic Studies: CT ABDOMEN PELVIS WO CONTRAST  Result Date: 05/19/2020 CLINICAL DATA:  Diffuse abdominal pain for 3 days. Chills. Dysuria. Renal transplant patient. EXAM: CT ABDOMEN AND PELVIS WITHOUT CONTRAST TECHNIQUE: Multidetector CT imaging of the abdomen and pelvis was performed following the standard protocol without IV contrast. COMPARISON:  None. FINDINGS: Lower chest: No acute findings. Hepatobiliary: No mass visualized on this unenhanced exam. Gallbladder is unremarkable. No evidence of biliary ductal dilatation. Pancreas: No mass or inflammatory process visualized on this unenhanced exam. Spleen:  Within normal limits in size. Adrenals/Urinary tract: Diffuse atrophy native kidneys is seen. Renal transplant in the right iliac fossa is unremarkable in appearance. No evidence of perinephric fluid or inflammatory changes. No evidence of hydronephrosis. Unremarkable unopacified urinary bladder. Stomach/Bowel: No evidence of obstruction, inflammatory process, or abnormal fluid collections. Vascular/Lymphatic: No pathologically enlarged lymph nodes identified. No evidence of abdominal aortic aneurysm. Aortic atherosclerotic calcification noted. Reproductive:  No mass or other significant abnormality. Other:  None. Musculoskeletal: No suspicious bone lesions identified. Internal fixation hardware noted in the left hip. 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 IMPRESSION: No acute findings within the abdomen or pelvis. Unremarkable appearance of right iliac fossa renal transplant. No evidence of hydronephrosis or perinephric fluid collections. Aortic Atherosclerosis (ICD10-I70.0). Electronically Signed   By: Marlaine Hind M.D.   On: 05/19/2020 13:34   DG Chest 2 View  Result  Date: 05/19/2020 CLINICAL DATA:  Fever, immunosuppression EXAM: CHEST - 2 VIEW COMPARISON:  09/20/2017 FINDINGS: Status post median sternotomy and CABG. Unchanged small chronic right pleural effusion and/or pleural thickening. The visualized skeletal structures are unremarkable. IMPRESSION: Unchanged small chronic right pleural effusion and/or pleural thickening. No acute appearing airspace opacity. Electronically Signed   By: Eddie Candle M.D.   On: 05/19/2020 12:42   DG Chest Portable 1 View  Result Date: 05/20/2020 CLINICAL DATA:  Fever.  Sepsis.  Hypoxia. EXAM: PORTABLE CHEST 1 VIEW COMPARISON:  Chest x-ray 05/19/2020, 02/14/2011. FINDINGS: Prior CABG. Cardiomegaly with pulmonary venous congestion and bilateral interstitial prominence suggesting CHF. Pneumonitis cannot be excluded. Stable right-sided pleural thickening consistent scarring. IMPRESSION: Prior CABG. Cardiomegaly with pulmonary venous congestion and bilateral interstitial prominence suggesting CHF. Pneumonitis cannot be excluded. Electronically Signed   By: Marcello Moores  Register   On: 05/20/2020 08:14   ECHOCARDIOGRAM COMPLETE  Result Date: 05/20/2020    ECHOCARDIOGRAM REPORT   Patient Name:   TRAMAINE SAULS Date of Exam: 05/20/2020 Medical Rec #:  [id number]  Height:       68.0 in Accession #:    [id number]         Weight:       162.0 lb Date of Birth:  Apr 26, 1956          BSA:          1.869 m Patient Age:    64 years           BP:           127/54 mmHg Patient Gender: M                  HR:           54 bpm. Exam Location:  [facility] Procedure: 2D Echo, Cardiac Doppler and Color Doppler Indications:     CHF- acute systolic 833.82  History:         Patient has prior history of Echocardiogram examinations, most                  recent 09/21/2017. CAD and Previous Myocardial Infarction; Risk                  Factors:Diabetes and Hypertension.  Sonographer:     Sherrie Sport RDCS (AE) Referring Phys:  [id number] Collier Bullock Diagnosing Phys:  Nelva Bush MD IMPRESSIONS  1. Left ventricular ejection fraction, by estimation, is 45 to 50%. The left ventricle has mildly decreased function. Left ventricular endocardial border not optimally defined to evaluate regional wall motion. There is moderate left ventricular hypertrophy. Left ventricular diastolic parameters are indeterminate.  2. Right ventricular systolic function is normal. The right ventricular size is normal.  3. Left atrial size was mildly dilated.  4. Right atrial size was mildly dilated.  5. The mitral valve is grossly normal. Trivial mitral valve regurgitation. Moderate mitral annular calcification.  6. The aortic valve is tricuspid. There is mild calcification of the aortic valve. There is mild thickening of the aortic valve. Aortic valve regurgitation is not visualized. Mild to moderate aortic valve sclerosis/calcification is present, without any evidence of aortic stenosis. FINDINGS  Left Ventricle: Left ventricular ejection fraction, by estimation, is 45 to 50%. The left ventricle has mildly decreased function. Left ventricular endocardial border not optimally defined to evaluate regional wall motion. The left ventricular internal cavity size was normal in size. There is moderate left ventricular hypertrophy. Left ventricular diastolic parameters are indeterminate. Right Ventricle: The right ventricular size is normal. No increase in right ventricular wall thickness. Right ventricular systolic function is normal. Left Atrium: Left atrial size was mildly dilated. Right Atrium: Right atrial size was mildly dilated. Pericardium: The pericardium was not well visualized. Mitral Valve: The mitral valve is grossly normal. Moderate mitral annular calcification. Trivial mitral valve regurgitation. Tricuspid Valve: The tricuspid valve is normal in structure. Tricuspid valve regurgitation is mild. Aortic Valve: The aortic valve is tricuspid. There is mild calcification of the aortic valve. There  is mild thickening of the aortic valve. Aortic valve regurgitation is not visualized. Mild to moderate aortic valve sclerosis/calcification is present, without any evidence of aortic stenosis. Aortic valve mean gradient measures 1.0 mmHg. Aortic valve peak gradient measures 2.8 mmHg. Aortic valve area, by VTI measures 3.72 cm. Pulmonic Valve: The pulmonic valve was normal in structure. Pulmonic valve regurgitation is not visualized. No evidence of pulmonic stenosis. Aorta: The aortic root is normal in size and structure. Pulmonary Artery: The pulmonary artery is not well seen. Venous: The inferior vena cava was  not well visualized. IAS/Shunts: The interatrial septum was not assessed.  LEFT VENTRICLE PLAX 2D LVIDd:         4.25 cm LVIDs:         3.34 cm LV PW:         1.22 cm LV IVS:        1.35 cm LVOT diam:     2.10 cm LV SV:         51 LV SV Index:   27 LVOT Area:     3.46 cm  LV Volumes (MOD) LV vol d, MOD A2C: 71.0 ml LV vol d, MOD A4C: 76.6 ml LV vol s, MOD A2C: 36.5 ml LV vol s, MOD A4C: 40.1 ml LV SV MOD A2C:     34.5 ml LV SV MOD A4C:     76.6 ml LV SV MOD BP:      33.7 ml RIGHT VENTRICLE RV S prime:     8.92 cm/s TAPSE (M-mode): 3.8 cm LEFT ATRIUM             Index       RIGHT ATRIUM           Index LA diam:        4.30 cm 2.30 cm/m  RA Area:     23.70 cm LA Vol (A2C):   63.4 ml 33.92 ml/m RA Volume:   73.60 ml  39.38 ml/m LA Vol (A4C):   55.6 ml 29.75 ml/m LA Biplane Vol: 67.5 ml 36.12 ml/m  AORTIC VALVE                   PULMONIC VALVE AV Area (Vmax):    2.66 cm    PV Vmax:        0.87 m/s AV Area (Vmean):   2.81 cm    PV Peak grad:   3.1 mmHg AV Area (VTI):     3.72 cm    RVOT Peak grad: 5 mmHg AV Vmax:           83.20 cm/s AV Vmean:          55.400 cm/s AV VTI:            0.137 m AV Peak Grad:      2.8 mmHg AV Mean Grad:      1.0 mmHg LVOT Vmax:         64.00 cm/s LVOT Vmean:        44.900 cm/s LVOT VTI:          0.147 m LVOT/AV VTI ratio: 1.07  AORTA Ao Root diam: 2.50 cm MITRAL VALVE                 TRICUSPID VALVE MV Area (PHT): 4.29 cm     TR Peak grad:   39.9 mmHg MV Decel Time: 177 msec     TR Vmax:        316.00 cm/s MV E velocity: 126.00 cm/s                             SHUNTS                             Systemic VTI:  0.15 m  Systemic Diam: 2.10 cm Nelva Bush MD Electronically signed by Nelva Bush MD Signature Date/Time: 05/20/2020/7:06:33 PM    Final    CT Renal Stone Study  Result Date: 04/27/2020 CLINICAL DATA:  Flank pain, stone disease suspected, urinary hesitancy, chills, history of renal transplant EXAM: CT ABDOMEN AND PELVIS WITHOUT CONTRAST TECHNIQUE: Multidetector CT imaging of the abdomen and pelvis was performed following the standard protocol without IV contrast. COMPARISON:  CT 04/12/2010, pelvic ultrasound 05/11/2011 lumbar MR 05/25/2011 FINDINGS: Lower chest: Evidence of prior sternotomy. Cardiac size is top normal with calcification of the native coronary arteries. No pericardial effusion. Some bandlike opacities in the lung bases likely reflect areas of atelectasis or scarring. Some mild dependent atelectasis as well. Hepatobiliary: No visible liver lesions on this unenhanced CT. Smooth liver surface contour. Normal liver attenuation. Normal gallbladder and biliary tree without visible calcified gallstone, pericholecystic inflammation or ductal dilatation. Pancreas: Unremarkable. No pancreatic ductal dilatation or surrounding inflammatory changes. Spleen: Normal in size. No concerning splenic lesions. Adrenals/Urinary Tract: Normal adrenal glands. Marked atrophy and cortical thinning of the native kidneys. No concerning masses of the native kidneys. Thigh no urolithiasis or hydronephrosis. Nonspecific symmetric bilateral perinephric stranding of the native kidneys. A right lower quadrant transplant kidney is noted. No concerning mass, urolithiasis or hydronephrosis of the transplant kidney. There is however circumferential bladder wall  thickening and some mild stranding about the transplant ureter as well. No bladder calculi or debris. Indentation of the bladder base by the enlarged prostate. Stomach/Bowel: Distal esophagus, stomach and duodenal sweep are unremarkable. No small bowel wall thickening or dilatation. No evidence of obstruction. A normal appendix is visualized. No colonic dilatation or wall thickening accounting for underdistention. Vascular/Lymphatic: Atherosclerotic calcifications within the abdominal aorta and branch vessels. No aneurysm or ectasia. No enlarged abdominopelvic lymph nodes. Reproductive: Borderline prostatomegaly. Mild hazy stranding about the prostate and seminal vesicles. Other: No abdominopelvic free air or fluid. No bowel containing hernias. Musculoskeletal: Prior left femoral intramedullary nail and transcervical pin without evidence of acute hardware complication. Minimal heterotopic calcification adjacent the left greater trochanter. Chronic deformity of the L2/L3 vertebral bodies likely reflecting sequela of discitis/osteomyelitis better seen on the comparison MRI from 2012. Resulting moderate to severe canal stenosis at this level. No acute osseous abnormality or suspicious osseous lesion. IMPRESSION: 1. Circumferential bladder wall thickening and some mild stranding about the transplant ureter, concerning for cystitis and ascending urinary tract infection. Correlate with urinalysis. 2. Borderline prostatomegaly with hazy stranding about the prostate and seminal vesicles, which could a concomitant prostatitis as well. 3. Marked atrophy and cortical thinning of the native kidneys. 4. Chronic deformity in fusion of the L2-L3 vertebral bodies compatible with sequela of discitis/osteomyelitis better seen on the comparison MRI from 2012. Resulting moderate to severe canal stenosis, similar to comparison MRI. 5. Aortic Atherosclerosis (ICD10-I70.0). Electronically Signed   By: Lovena Le M.D.   On: 04/27/2020  02:53    Microbiology: Recent Results (from the past 240 hour(s))  Urine Culture     Status: Abnormal   Collection Time: 05/19/20  6:46 AM   Specimen: Urine, Random  Result Value Ref Range Status   Specimen Description   Final    URINE, RANDOM Performed at [facility], [address], [place], [place] [address]    Special Requests   Final    NONE Performed at [facility], [address] [place], [place] [address]    Culture >=100,000 COLONIES/mL ESCHERICHIA COLI (A)  Final   Report  Status 05/21/2020 FINAL  Final   Organism ID, Bacteria ESCHERICHIA COLI (A)  Final      Susceptibility   Escherichia coli - MIC*    AMPICILLIN <=2 SENSITIVE Sensitive     CEFAZOLIN <=4 SENSITIVE Sensitive     CEFTRIAXONE <=0.25 SENSITIVE Sensitive     CIPROFLOXACIN <=0.25 SENSITIVE Sensitive     GENTAMICIN <=1 SENSITIVE Sensitive     IMIPENEM <=0.25 SENSITIVE Sensitive     NITROFURANTOIN <=16 SENSITIVE Sensitive     TRIMETH/SULFA <=20 SENSITIVE Sensitive     AMPICILLIN/SULBACTAM <=2 SENSITIVE Sensitive     PIP/TAZO <=4 SENSITIVE Sensitive     * >=100,000 COLONIES/mL ESCHERICHIA COLI  Blood culture (routine x 2)     Status: None   Collection Time: 05/19/20 12:19 PM   Specimen: BLOOD  Result Value Ref Range Status   Specimen Description BLOOD BLOOD LEFT HAND  Final   Special Requests   Final    BOTTLES DRAWN AEROBIC AND ANAEROBIC Blood Culture results may not be optimal due to an inadequate volume of blood received in culture bottles   Culture   Final    NO GROWTH 5 DAYS Performed at [facility], [address] [place], [place] [address]    Report Status 05/24/2020 FINAL  Final  Respiratory Panel by RT PCR (Flu A&B, Covid) - Nasopharyngeal Swab     Status: None   Collection Time: 05/19/20  1:29 PM   Specimen: Nasopharyngeal Swab  Result Value Ref Range Status   SARS Coronavirus 2 by RT PCR NEGATIVE NEGATIVE Final    Comment: (NOTE) SARS-CoV-2 target  nucleic acids are NOT DETECTED.  The SARS-CoV-2 RNA is generally detectable in upper respiratoy specimens during the acute phase of infection. The lowest concentration of SARS-CoV-2 viral copies this assay can detect is 131 copies/mL. A negative result does not preclude SARS-Cov-2 infection and should not be used as the sole basis for treatment or other patient management decisions. A negative result may occur with  improper specimen collection/handling, submission of specimen other than nasopharyngeal swab, presence of viral mutation(s) within the areas targeted by this assay, and inadequate number of viral copies (<131 copies/mL). A negative result must be combined with clinical observations, patient history, and epidemiological information. The expected result is Negative.  Fact Sheet for Patients:  PinkCheek.be  Fact Sheet for Healthcare Providers:  GravelBags.it  This test is no t yet approved or cleared by the [place] FDA and  has been authorized for detection and/or diagnosis of SARS-CoV-2 by FDA under an Emergency Use Authorization (EUA). This EUA will remain  in effect (meaning this test can be used) for the duration of the COVID-19 declaration under Section 564(b)(1) of the Act, 21 U.S.C. section 360bbb-3(b)(1), unless the authorization is terminated or revoked sooner.     Influenza A by PCR NEGATIVE NEGATIVE Final   Influenza B by PCR NEGATIVE NEGATIVE Final    Comment: (NOTE) The Xpert Xpress SARS-CoV-2/FLU/RSV assay is intended as an aid in  the diagnosis of influenza from Nasopharyngeal swab specimens and  should not be used as a sole basis for treatment. Nasal washings and  aspirates are unacceptable for Xpert Xpress SARS-CoV-2/FLU/RSV  testing.  Fact Sheet for Patients: PinkCheek.be  Fact Sheet for Healthcare  Providers: GravelBags.it  This test is not yet approved or cleared by the [place] FDA and  has been authorized for detection and/or diagnosis of SARS-CoV-2 by  FDA under an Emergency Use Authorization (EUA). This EUA will  remain  in effect (meaning this test can be used) for the duration of the  Covid-19 declaration under Section 564(b)(1) of the Act, 21  U.S.C. section 360bbb-3(b)(1), unless the authorization is  terminated or revoked. Performed at [facility], [address] [place], [place] [address]   Blood culture (routine x 2)     Status: None   Collection Time: 05/19/20  1:29 PM   Specimen: BLOOD  Result Value Ref Range Status   Specimen Description BLOOD BLOOD RIGHT HAND  Final   Special Requests   Final    BOTTLES DRAWN AEROBIC AND ANAEROBIC Blood Culture adequate volume   Culture   Final    NO GROWTH 5 DAYS Performed at [facility], [address] [place], [place] [address]    Report Status 05/24/2020 FINAL  Final     Labs: CBC: Recent Labs  Lab 05/19/20 0709 05/20/20 0716 05/21/20 0532 05/22/20 0515  WBC 15.4* 12.6* 8.4 6.5  HGB 13.5 11.8* 11.2* 12.3*  HCT 38.9* 34.2* 32.8* 35.5*  MCV 86.6 87.0 87.7 86.4  PLT 218 151 144* 448   Basic Metabolic Panel: Recent Labs  Lab 05/19/20 0709 05/20/20 0716 05/21/20 0532 05/22/20 0515  NA 132* 130* 131* 132*  K 3.9 3.3* 2.9* 3.2*  CL 97* 97* 96* 97*  CO2 22 23 24 24   GLUCOSE 209* 143* 223* 326*  BUN 30* 25* 22 19  CREATININE 1.52* 1.25* 1.34* 1.03  CALCIUM 8.6* 8.0* 7.8* 8.2*  PHOS  --   --   --  2.2*   Liver Function Tests: Recent Labs  Lab 05/19/20 0709 05/22/20 0515  AST 22  --   ALT 20  --   ALKPHOS 44  --   BILITOT 1.9*  --   PROT 7.3  --   ALBUMIN 4.0 3.0*   CBG: Recent Labs  Lab 05/21/20 1225 05/21/20 1610 05/21/20 2116 05/22/20 0742 05/22/20 1206  GLUCAP 275* 244* 257* 385* 271*    Time spent: 35  minutes  Signed:  Berle Mull  [facility] 05/22/2020 7:19 PM

## 2020-05-27 NOTE — Progress Notes (Signed)
05/28/2020 1:27 PM   [name] 03/31/1956 [id number]  Referring provider: Lianne Bushy, [place],  [place] [address] Chief Complaint  Patient presents with  . Benign Prostatic Hypertrophy    HPI: [name] is a 64 y.o. male who presents today for evaluation and management of BPH without lower urinary tract symptoms and a renal transplant consult.   Patient has a history of end-stage renal disease status post right renal 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/2015.   Patient was admitted to the hospital 09/26 to 09/75for similar symptoms and at that time had a urine culture that yielded greater than 100,000 colony units of E. coli sensitive to cephalosporins. Patient was seen in consultation during that hospitalization by urology who recommended to treat patient with antibiotics for 14 days.   Upon arrival to the ER he had a fever with a T-max of103 F,tachycardia with heart rate of 105, tachypnea respiratory rate of 21. CT scan of abdomen and pelvis shows no acute findings within the abdomen or pelvis. Unremarkable appearance of right iliac fossa renal transplant. No evidence of hydronephrosis or perinephric fluid collection. CRX showed prior CABG. Cardiomegaly with pulmonary venous congestion and bilateral interstitial prominence suggesting CHF. Pneumonitis cannot be excluded.  Urine analysis showed pyuria and patient was recommended to continue antibiotic therapy. He was given IV cefepime which was later switched to cefazolin. Patient was given oral Cipro upon discharge of which he took his last dose this a.m.  He reports that the day before yesterday and yesterday he has some dysuria but today is doing better.  He has some urgency frequency related to this specific UTI which is also improving but persistent.  IPSS is reflective of acute symptoms below.  The patient has a long history of BPH with lower urinary tract symptoms. He has been on Flomax 0.8mg  daily x 10+   years with stable symptoms.  He does not recall having any PSA screening in the past.  I do not see any PSAs documented in the recent past.  No recent rectal exams.   IPSS    Row Name 05/28/20 1300         International Prostate Symptom Score   How often have you had the sensation of not emptying your bladder? Almost always     How often have you had to urinate less than every two hours? Almost always     How often have you found you stopped and started again several times when you urinated? Not at All     How often have you found it difficult to postpone urination? Almost always     How often have you had a weak urinary stream? Not at All     How often have you had to strain to start urination? Not at All     How many times did you typically get up at night to urinate? 5 Times     Total IPSS Score 20       Quality of Life due to urinary symptoms   If you were to spend the rest of your life with your urinary condition just the way it is now how would you feel about that? Terrible            Score:  1-7 Mild 8-19 Moderate 20-35 Severe   PMH: Past Medical History:  Diagnosis Date  . Arthritis   . Chronic kidney disease    KIDNEY TRANSPLANT  . Coronary artery disease   .  Diabetes mellitus without complication ([place])   . Dyspnea   . GERD (gastroesophageal reflux disease)   . History of blood transfusion   . Hypertension   . Myocardial infarction [facility])     Surgical History: Past Surgical History:  Procedure Laterality Date  . CATARACT EXTRACTION W/PHACO Right 02/03/2017   Procedure: CATARACT EXTRACTION PHACO AND INTRAOCULAR LENS PLACEMENT (IOC);  Surgeon: Leandrew Koyanagi, MD;  Location: [facility] ORS;  Service: Ophthalmology;  Laterality: Right;  [place] 00:35.8 AP% 12.8 CDE 4.57 Fluid lot # [id number]  . CORONARY ANGIOPLASTY     STENTS X 4  . CORONARY ARTERY BYPASS GRAFT    . ENDARTERECTOMY Left 09/06/2019   Procedure: ENDARTERECTOMY CAROTID;  Surgeon: Algernon Huxley, MD;   Location: [facility] ORS;  Service: Vascular;  Laterality: Left;  . EYE SURGERY Bilateral    cataract  . HIP SURGERY    . KIDNEY TRANSPLANT     2016    Home Medications:  Allergies as of 05/28/2020   No Known Allergies     Medication List       Accurate as of May 28, 2020  1:27 PM. If you have any questions, ask your nurse or doctor.        STOP taking these medications   phenazopyridine 200 MG tablet Commonly known as: PYRIDIUM Stopped by: Hollice Espy, MD     TAKE these medications   Advocate Insulin Pen Needles 33G X 4 MM Misc Generic drug: Insulin Pen Needle To use with insulin pen 4 times per day. E 13.9   amLODipine 10 MG tablet Commonly known as: NORVASC Take 10 mg by mouth daily.   aspirin 81 MG EC tablet Take 1 tablet (81 mg total) by mouth daily at 6 (six) AM.   atorvastatin 40 MG tablet Commonly known as: LIPITOR 40 mg daily at 6 PM.   carvedilol 12.5 MG tablet Commonly known as: COREG Take 12.5 mg by mouth at bedtime.   CellCept 250 MG capsule Generic drug: mycophenolate Take 750 mg by mouth 2 (two) times daily.   ciprofloxacin 500 MG tablet Commonly known as: Cipro Take 1 tablet (500 mg total) by mouth 2 (two) times daily for 6 days.   CVS Vitamin C 500 MG tablet Generic drug: ascorbic acid Take 500 mg by mouth daily.   Eliquis 5 MG Tabs tablet Generic drug: apixaban Take 5 mg by mouth 2 (two) times daily.   HumaLOG KwikPen 100 UNIT/ML KwikPen Generic drug: insulin lispro Inject 10-12 Units into the skin 3 (three) times daily. Inject 10u daily at breakfast, 10u at lunch and up to 12u at supper per SSI   Lantus SoloStar 100 UNIT/ML Solostar Pen Generic drug: insulin glargine Inject 24 Units into the skin at bedtime.   linagliptin 5 MG Tabs tablet Commonly known as: TRADJENTA Take 5 mg by mouth daily.   omeprazole 20 MG capsule Commonly known as: PRILOSEC Take 20 mg by mouth daily as needed.   Prograf 1 MG capsule Generic drug:  tacrolimus Take 1 mg by mouth 2 (two) times daily.   saccharomyces boulardii 250 MG capsule Commonly known as: FLORASTOR Take 1 capsule (250 mg total) by mouth 2 (two) times daily.   tamsulosin 0.4 MG Caps capsule Commonly known as: FLOMAX Take 0.4 mg by mouth 2 (two) times daily.   Vitamin D3 50 MCG (2000 UT) capsule Take 2,000 Units by mouth daily.       Allergies: No Known Allergies  Family History: Family History  Problem Relation Age of Onset  . Drug abuse Sister   . Drug abuse Brother     Social History:  reports that he has quit smoking. He has never used smokeless tobacco. He reports that he does not drink alcohol and does not use drugs.   Physical Exam: BP (!) 166/65   Pulse 76   Ht 5\' 8"  (1.727 m)   Wt 158 lb (71.7 kg)   BMI 24.02 kg/m   Constitutional:  Alert and oriented, No acute distress. HEENT: [place] AT, moist mucus membranes.  Trachea midline, no masses. Cardiovascular: No clubbing, cyanosis, or edema. Respiratory: Normal respiratory effort, no increased work of breathing. Skin: No rashes, bruises or suspicious lesions. Neurologic: Grossly intact, no focal deficits, moving all 4 extremities. Psychiatric: Normal mood and affect.  Laboratory Data:  Lab Results  Component Value Date   CREATININE 1.03 05/22/2020    Lab Results  Component Value Date   HGBA1C 6.5 (H) 04/27/2020    Urinalysis 10-30 WBCs, otherwise urine unremarkable.  See epic.  Results for orders placed or performed in visit on 05/28/20  BLADDER SCAN AMB NON-IMAGING  Result Value Ref Range   Scan Result 1 ml      Pertinent Imaging:  Results for orders placed during the hospital encounter of 04/27/20  CT Renal Stone Study  Narrative CLINICAL DATA:  Flank pain, stone disease suspected, urinary hesitancy, chills, history of renal transplant  EXAM: CT ABDOMEN AND PELVIS WITHOUT CONTRAST  TECHNIQUE: Multidetector CT imaging of the abdomen and pelvis was  performed following the standard protocol without IV contrast.  COMPARISON:  CT 04/12/2010, pelvic ultrasound 05/11/2011 lumbar MR 05/25/2011  FINDINGS: Lower chest: Evidence of prior sternotomy. Cardiac size is top normal with calcification of the native coronary arteries. No pericardial effusion. Some bandlike opacities in the lung bases likely reflect areas of atelectasis or scarring. Some mild dependent atelectasis as well.  Hepatobiliary: No visible liver lesions on this unenhanced CT. Smooth liver surface contour. Normal liver attenuation. Normal gallbladder and biliary tree without visible calcified gallstone, pericholecystic inflammation or ductal dilatation.  Pancreas: Unremarkable. No pancreatic ductal dilatation or surrounding inflammatory changes.  Spleen: Normal in size. No concerning splenic lesions.  Adrenals/Urinary Tract: Normal adrenal glands. Marked atrophy and cortical thinning of the native kidneys. No concerning masses of the native kidneys. Thigh no urolithiasis or hydronephrosis. Nonspecific symmetric bilateral perinephric stranding of the native kidneys.  A right lower quadrant transplant kidney is noted. No concerning mass, urolithiasis or hydronephrosis of the transplant kidney. There is however circumferential bladder wall thickening and some mild stranding about the transplant ureter as well. No bladder calculi or debris. Indentation of the bladder base by the enlarged prostate.  Stomach/Bowel: Distal esophagus, stomach and duodenal sweep are unremarkable. No small bowel wall thickening or dilatation. No evidence of obstruction. A normal appendix is visualized. No colonic dilatation or wall thickening accounting for underdistention.  Vascular/Lymphatic: Atherosclerotic calcifications within the abdominal aorta and branch vessels. No aneurysm or ectasia. No enlarged abdominopelvic lymph nodes.  Reproductive: Borderline prostatomegaly. Mild hazy  stranding about the prostate and seminal vesicles.  Other: No abdominopelvic [name] air or fluid. No bowel containing hernias.  Musculoskeletal: Prior left femoral intramedullary nail and transcervical pin without evidence of acute hardware complication. Minimal heterotopic calcification adjacent the left greater trochanter. Chronic deformity of the L2/L3 vertebral bodies likely reflecting sequela of discitis/osteomyelitis better seen on the comparison MRI from 2012. Resulting moderate to severe canal stenosis at this level. No acute  osseous abnormality or suspicious osseous lesion.  IMPRESSION: 1. Circumferential bladder wall thickening and some mild stranding about the transplant ureter, concerning for cystitis and ascending urinary tract infection. Correlate with urinalysis. 2. Borderline prostatomegaly with hazy stranding about the prostate and seminal vesicles, which could a concomitant prostatitis as well. 3. Marked atrophy and cortical thinning of the native kidneys. 4. Chronic deformity in fusion of the L2-L3 vertebral bodies compatible with sequela of discitis/osteomyelitis better seen on the comparison MRI from 2012. Resulting moderate to severe canal stenosis, similar to comparison MRI. 5. Aortic Atherosclerosis (ICD10-I70.0).   Electronically Signed By: Lovena Le M.D. On: 04/27/2020 02:53  I have personally reviewed the images and agree with radiologist interpretation.    Assessment & Plan:    1. BPH with lower urinary tract symptoms Patient has been on Flomax 0.8 mg x 10 years.   Patient may benefit from an outlet procedure given recent infection and extensive BPH history, worsening urinary symptoms and more recently UTI   Patient recently completed antibiotic treatment with improvement in lower urinary tract symptoms, despite gradual improvement  Adequate bladder emptying today  UA today shows 11-30 WBC's and no RBC's,  Will repeat cultures today as a  precaution  Unclear whether this infection was random event in the setting of immunosuppression or perhaps a symptom of ongoing BPH/incomplete bladder emptying etc.  Symptoms anticipated to improve with time  Continue Flomax.  RTC in 3 months with IPSS/PVR -Urine culture sent.   2. PSA screening Will obtain PSA next visit for screening  RTC in 3 months for PSA/DRE/ iPSS/ PVR    [facility] [address], [place], [place] [address] [phone]  I, Selena Batten, am acting as a scribe for Dr. Hollice Espy.  I have reviewed the above documentation for accuracy and completeness, and I agree with the above.   Hollice Espy, MD

## 2020-05-28 ENCOUNTER — Other Ambulatory Visit: Payer: Self-pay

## 2020-05-28 ENCOUNTER — Ambulatory Visit: Payer: Medicare Other | Admitting: Urology

## 2020-05-28 VITALS — BP 166/65 | HR 76 | Ht 68.0 in | Wt 158.0 lb

## 2020-05-28 DIAGNOSIS — N400 Enlarged prostate without lower urinary tract symptoms: Secondary | ICD-10-CM

## 2020-05-28 DIAGNOSIS — N401 Enlarged prostate with lower urinary tract symptoms: Secondary | ICD-10-CM | POA: Diagnosis not present

## 2020-05-28 DIAGNOSIS — N3000 Acute cystitis without hematuria: Secondary | ICD-10-CM

## 2020-05-28 DIAGNOSIS — N138 Other obstructive and reflux uropathy: Secondary | ICD-10-CM | POA: Diagnosis not present

## 2020-05-28 LAB — BLADDER SCAN AMB NON-IMAGING: Scan Result: 1

## 2020-05-29 LAB — URINALYSIS, COMPLETE
Bilirubin, UA: NEGATIVE
Leukocytes,UA: NEGATIVE
Nitrite, UA: NEGATIVE
Specific Gravity, UA: 1.025 (ref 1.005–1.030)
Urobilinogen, Ur: 2 mg/dL — ABNORMAL HIGH (ref 0.2–1.0)
pH, UA: 6.5 (ref 5.0–7.5)

## 2020-05-29 LAB — MICROSCOPIC EXAMINATION: Bacteria, UA: NONE SEEN

## 2020-05-31 LAB — CULTURE, URINE COMPREHENSIVE

## 2020-06-03 DIAGNOSIS — E1122 Type 2 diabetes mellitus with diabetic chronic kidney disease: Secondary | ICD-10-CM | POA: Insufficient documentation

## 2020-06-03 DIAGNOSIS — Z9289 Personal history of other medical treatment: Secondary | ICD-10-CM

## 2020-06-03 DIAGNOSIS — Z794 Long term (current) use of insulin: Secondary | ICD-10-CM | POA: Insufficient documentation

## 2020-06-03 HISTORY — DX: Personal history of other medical treatment: Z92.89

## 2020-08-27 ENCOUNTER — Other Ambulatory Visit (INDEPENDENT_AMBULATORY_CARE_PROVIDER_SITE_OTHER): Payer: Self-pay | Admitting: Nurse Practitioner

## 2020-08-27 DIAGNOSIS — Z940 Kidney transplant status: Secondary | ICD-10-CM

## 2020-08-28 ENCOUNTER — Other Ambulatory Visit: Payer: Self-pay | Admitting: *Deleted

## 2020-08-28 ENCOUNTER — Other Ambulatory Visit: Payer: Self-pay

## 2020-08-28 ENCOUNTER — Ambulatory Visit: Payer: Medicare Other | Admitting: Urology

## 2020-08-28 ENCOUNTER — Encounter: Payer: Self-pay | Admitting: Urology

## 2020-08-28 DIAGNOSIS — N138 Other obstructive and reflux uropathy: Secondary | ICD-10-CM

## 2020-08-28 DIAGNOSIS — N401 Enlarged prostate with lower urinary tract symptoms: Secondary | ICD-10-CM

## 2020-08-29 ENCOUNTER — Ambulatory Visit (INDEPENDENT_AMBULATORY_CARE_PROVIDER_SITE_OTHER): Payer: Medicare Other | Admitting: Nurse Practitioner

## 2020-08-29 ENCOUNTER — Other Ambulatory Visit: Payer: Self-pay

## 2020-08-29 ENCOUNTER — Ambulatory Visit (INDEPENDENT_AMBULATORY_CARE_PROVIDER_SITE_OTHER): Payer: Medicare Other

## 2020-08-29 ENCOUNTER — Other Ambulatory Visit: Payer: Medicare Other

## 2020-08-29 ENCOUNTER — Encounter (INDEPENDENT_AMBULATORY_CARE_PROVIDER_SITE_OTHER): Payer: Self-pay | Admitting: Nurse Practitioner

## 2020-08-29 VITALS — BP 158/66 | HR 63 | Resp 16 | Wt 159.0 lb

## 2020-08-29 DIAGNOSIS — N138 Other obstructive and reflux uropathy: Secondary | ICD-10-CM

## 2020-08-29 DIAGNOSIS — Z940 Kidney transplant status: Secondary | ICD-10-CM | POA: Diagnosis not present

## 2020-08-29 DIAGNOSIS — I6523 Occlusion and stenosis of bilateral carotid arteries: Secondary | ICD-10-CM

## 2020-08-29 DIAGNOSIS — I10 Essential (primary) hypertension: Secondary | ICD-10-CM | POA: Diagnosis not present

## 2020-08-29 DIAGNOSIS — E1159 Type 2 diabetes mellitus with other circulatory complications: Secondary | ICD-10-CM | POA: Diagnosis not present

## 2020-08-29 DIAGNOSIS — N401 Enlarged prostate with lower urinary tract symptoms: Secondary | ICD-10-CM

## 2020-08-29 NOTE — Progress Notes (Signed)
Subjective:    Patient ID: [name], male    DOB: January 01, 1956, 65 y.o.   MRN: [id number] Chief Complaint  Patient presents with  . Follow-up    Ultrasound follow up    The patient presents today for annual follow-up of his dialysis access.  The patient received a kidney in 2016 and he has not used his dialysis access since then.  He currently has a left brachiocephalic AV fistula.  The patient notes that his recent lab work has been doing very well.  He also had a recent follow-up with his cardiologist who also follows his carotid artery stenosis after his carotid endarterectomy and he notes that he received a positive report there as well.  Overall patient is doing very well he denies any fever, chills, nausea, vomiting or diarrhea.  Today the patient has a flow volume of 1547.  No evidence of significant stenosis   Review of Systems  Musculoskeletal: Positive for gait problem.  All other systems reviewed and are negative.      Objective:   Physical Exam Vitals reviewed.  HENT:     Head: Normocephalic.  Cardiovascular:     Rate and Rhythm: Normal rate.     Pulses: Normal pulses.          Radial pulses are 2+ on the left side.     Arteriovenous access: left arteriovenous access is present.    Comments: Good thrill and bruit Pulmonary:     Effort: Pulmonary effort is normal.  Skin:    General: Skin is warm and dry.  Neurological:     Mental Status: He is alert and oriented to person, place, and time.  Psychiatric:        Mood and Affect: Mood normal.        Behavior: Behavior normal.        Thought Content: Thought content normal.        Judgment: Judgment normal.     BP (!) 158/66 (BP Location: Right Arm)   Pulse 63   Resp 16   Wt 159 lb (72.1 kg)   BMI 24.18 kg/m   Past Medical History:  Diagnosis Date  . Arthritis   . Chronic kidney disease    KIDNEY TRANSPLANT  . Coronary artery disease   . Diabetes mellitus without complication ([place])   .  Dyspnea   . GERD (gastroesophageal reflux disease)   . History of blood transfusion   . Hypertension   . Myocardial infarction [facility])     Social History   Socioeconomic History  . Marital status: Married    Spouse name: Not on file  . Number of children: Not on file  . Years of education: Not on file  . Highest education level: Not on file  Occupational History  . Not on file  Tobacco Use  . Smoking status: Former Research scientist (life sciences)  . Smokeless tobacco: Never Used  . Tobacco comment: 30 years ago  Vaping Use  . Vaping Use: Never used  Substance and Sexual Activity  . Alcohol use: No    Alcohol/week: 0.0 standard drinks  . Drug use: Never  . Sexual activity: Not on file  Other Topics Concern  . Not on file  Social History Narrative  . Not on file   Social Determinants of Health   Financial Resource Strain: Not on file  Food Insecurity: Not on file  Transportation Needs: Not on file  Physical Activity: Not on file  Stress: Not on file  Social Connections: Not on file  Intimate Partner Violence: Not on file    Past Surgical History:  Procedure Laterality Date  . CATARACT EXTRACTION W/PHACO Right 02/03/2017   Procedure: CATARACT EXTRACTION PHACO AND INTRAOCULAR LENS PLACEMENT (IOC);  Surgeon: Leandrew Koyanagi, MD;  Location: [facility] ORS;  Service: Ophthalmology;  Laterality: Right;  [place] 00:35.8 AP% 12.8 CDE 4.57 Fluid lot # [id number]  . CORONARY ANGIOPLASTY     STENTS X 4  . CORONARY ARTERY BYPASS GRAFT    . ENDARTERECTOMY Left 09/06/2019   Procedure: ENDARTERECTOMY CAROTID;  Surgeon: Algernon Huxley, MD;  Location: [facility] ORS;  Service: Vascular;  Laterality: Left;  . EYE SURGERY Bilateral    cataract  . HIP SURGERY    . KIDNEY TRANSPLANT     2016    Family History  Problem Relation Age of Onset  . Drug abuse Sister   . Drug abuse Brother     No Known Allergies  CBC Latest Ref Rng & Units 05/22/2020 05/21/2020 05/20/2020  WBC 4.0 - 10.5 K/uL 6.5 8.4 12.6(H)  Hemoglobin  13.0 - 17.0 g/dL 12.3(L) 11.2(L) 11.8(L)  Hematocrit 39.0 - 52.0 % 35.5(L) 32.8(L) 34.2(L)  Platelets 150 - 400 K/uL 169 144(L) 151      CMP     Component Value Date/Time   NA 132 (L) 05/22/2020 0515   K 3.2 (L) 05/22/2020 0515   K 4.8 08/29/2012 0954   CL 97 (L) 05/22/2020 0515   CO2 24 05/22/2020 0515   GLUCOSE 326 (H) 05/22/2020 0515   BUN 19 05/22/2020 0515   CREATININE 1.03 05/22/2020 0515   CALCIUM 8.2 (L) 05/22/2020 0515   CALCIUM 8.0 (L) 11/11/2010 2120   PROT 7.3 05/19/2020 0709   ALBUMIN 3.0 (L) 05/22/2020 0515   AST 22 05/19/2020 0709   ALT 20 05/19/2020 0709   ALKPHOS 44 05/19/2020 0709   BILITOT 1.9 (H) 05/19/2020 0709   GFRNONAA >60 05/22/2020 0515   GFRAA >60 04/29/2020 0337     No results found.     Assessment & Plan:   1. Kidney transplant recipient The patient notes that his kidney has been functioning well.  His recent follow-ups have been doing very good.  The patient has not utilized his access since 2016.  We will continue with annual follow-ups of the patient's dialysis access.  We also discussed different options in regards to intervention versus no intervention if it becomes necessary with his dialysis access.  Barring any issues, we will see the patient in 1 year for noninvasive studies.  2. Bilateral carotid artery stenosis The patient continues to be followed by Dr. Chancy Milroy for carotid artery stenosis.  Patient notes that recent office visit with Dr. Yancey Flemings revealed that his carotid endarterectomy continued to do well.  3. Essential hypertension Continue antihypertensive medications as already ordered, these medications have been reviewed and there are no changes at this time.   4. Type 2 diabetes mellitus with other circulatory complication, unspecified whether long term insulin use (HCC) Continue hypoglycemic medications as already ordered, these medications have been reviewed and there are no changes at this time.  Hgb A1C to be monitored as  already arranged by primary service    Current Outpatient Medications on File Prior to Visit  Medication Sig Dispense Refill  . amLODipine (NORVASC) 10 MG tablet Take 10 mg by mouth daily.     [name] ascorbic acid (VITAMIN C) 500 MG tablet Take 500 mg by mouth daily.    [name] aspirin  EC 81 MG EC tablet Take 1 tablet (81 mg total) by mouth daily at 6 (six) AM.    . atorvastatin (LIPITOR) 40 MG tablet 40 mg daily at 6 PM.     . carvedilol (COREG) 12.5 MG tablet Take 12.5 mg by mouth at bedtime.    . chlorthalidone (HYGROTON) 25 MG tablet Take 12.5 mg by mouth every morning.    . Cholecalciferol (VITAMIN D3) 50 MCG (2000 UT) capsule Take 2,000 Units by mouth daily.     . Continuous Blood Gluc Sensor (FREESTYLE LIBRE 2 SENSOR) MISC by Does not apply route.    [name] ELIQUIS 5 MG TABS tablet Take 5 mg by mouth 2 (two) times daily.     [name] HUMALOG KWIKPEN 100 UNIT/ML KiwkPen Inject 10-12 Units into the skin 3 (three) times daily. Inject 10u daily at breakfast, 10u at lunch and up to 12u at supper per SSI    . Insulin Disposable Pump (OMNIPOD DASH 5 PACK PODS) MISC     . Insulin Pen Needle 33G X 4 MM MISC To use with insulin pen 4 times per day. E 13.9    . LANTUS SOLOSTAR 100 UNIT/ML Solostar Pen Inject 24 Units into the skin at bedtime.     [name] linagliptin (TRADJENTA) 5 MG TABS tablet Take 5 mg by mouth daily.     [name] losartan (COZAAR) 50 MG tablet Take 50 mg by mouth daily.    . mycophenolate (CELLCEPT) 250 MG capsule Take 750 mg by mouth 2 (two) times daily.     [name] omeprazole (PRILOSEC) 20 MG capsule Take 20 mg by mouth daily as needed.     [name] PROGRAF 1 MG capsule Take 1 mg by mouth 2 (two) times daily.     [name] saccharomyces boulardii (FLORASTOR) 250 MG capsule Take 1 capsule (250 mg total) by mouth 2 (two) times daily. 30 capsule 0  . tamsulosin (FLOMAX) 0.4 MG CAPS capsule Take 0.4 mg by mouth 2 (two) times daily.      No current facility-administered medications on file prior to visit.    There are no Patient  Instructions on file for this visit. No follow-ups on file.   Kris Hartmann, NP

## 2020-08-30 LAB — PSA: Prostate Specific Ag, Serum: 0.5 ng/mL (ref 0.0–4.0)

## 2020-09-02 ENCOUNTER — Ambulatory Visit: Payer: Medicare Other | Admitting: Urology

## 2020-09-02 ENCOUNTER — Encounter: Payer: Self-pay | Admitting: Urology

## 2020-09-02 ENCOUNTER — Other Ambulatory Visit: Payer: Self-pay

## 2020-09-02 VITALS — BP 124/74 | HR 66 | Ht 68.0 in | Wt 159.0 lb

## 2020-09-02 DIAGNOSIS — N138 Other obstructive and reflux uropathy: Secondary | ICD-10-CM | POA: Diagnosis not present

## 2020-09-02 DIAGNOSIS — N5314 Retrograde ejaculation: Secondary | ICD-10-CM

## 2020-09-02 DIAGNOSIS — N401 Enlarged prostate with lower urinary tract symptoms: Secondary | ICD-10-CM

## 2020-09-02 LAB — BLADDER SCAN AMB NON-IMAGING: Scan Result: 0

## 2020-09-02 MED ORDER — FINASTERIDE 5 MG PO TABS: 5.0000 mg | ORAL_TABLET | Freq: Every day | ORAL | 4 refills | Status: DC

## 2020-09-02 NOTE — Progress Notes (Signed)
09/02/2020 11:24 AM   [name] 1956/03/12 [id number]  Referring provider: Frazier Richards, [address] [place],  [place] [address]  Chief Complaint  Patient presents with  . Benign Prostatic Hypertrophy    HPI: 65 year old male with a personal history of BPH, UTI who returns today for 9-monthfollow-up.  At last visit, he recently was hospitalized with severe UTI.  When seen in the clinic following his admission, symptoms were waiting.  He continues to take Flomax 2 tablets daily.  He reports that he is most bothered by nocturia x5.  He also has some difficulty starting his stream and once he gets started, he is able to empty completely.  This is overall stable.  No dysuria gross hematuria or any other signs or symptoms of infection.  He also reports today that he has some exacerbation of his urinary symptoms after ejaculating.  If he ejaculates at nighttime, these are the nights where he gets up frequently.  His urinary symptoms are exacerbated.  Also has some testicular discomfort after ejaculating.  He does not have the symptoms in the absence of sexual activity and/or ejaculation.  He does note that he does not see ejaculate fluid with orgasm on Flomax.    PSA 0.5 08/2019.  IPSS    Row Name 09/02/20 0900         International Prostate Symptom Score   How often have you had the sensation of not emptying your bladder? Less than 1 in 5     How often have you had to urinate less than every two hours? About half the time     How often have you found you stopped and started again several times when you urinated? Almost always     How often have you found it difficult to postpone urination? Less than 1 in 5 times     How often have you had a weak urinary stream? Not at All     How often have you had to strain to start urination? Not at All     How many times did you typically get up at night to urinate? 5 Times     Total IPSS Score 15           Quality of  Life due to urinary symptoms   If you were to spend the rest of your life with your urinary condition just the way it is now how would you feel about that? Mixed            Score:  1-7 Mild 8-19 Moderate 20-35 Severe    PMH: Past Medical History:  Diagnosis Date  . Arthritis   . Chronic kidney disease    KIDNEY TRANSPLANT  . Coronary artery disease   . [place]   . Dyspnea   . GERD (gastroesophageal reflux disease)   . History of blood transfusion   . Hypertension   . Myocardial infarction ([facility]     Surgical History: Past Surgical History:  Procedure Laterality Date  . CATARACT EXTRACTION W/PHACO Right 02/03/2017   Procedure: CATARACT EXTRACTION PHACO AND INTRAOCULAR LENS PLACEMENT (IOC);  Surgeon: BLeandrew Koyanagi MD;  Location: [facility] ORS;  Service: Ophthalmology;  Laterality: Right;  [place]:35.8 AP% 12.8 CDE 4.57 Fluid lot # [id number]  . CORONARY ANGIOPLASTY     STENTS X 4  . CORONARY ARTERY BYPASS GRAFT    . ENDARTERECTOMY Left 09/06/2019   Procedure: ENDARTERECTOMY CAROTID;  Surgeon: DLeotis Pain  S, MD;  Location: [facility] ORS;  Service: Vascular;  Laterality: Left;  . EYE SURGERY Bilateral    cataract  . HIP SURGERY    . KIDNEY TRANSPLANT     2016    Home Medications:  Allergies as of 09/02/2020   No Known Allergies     Medication List       Accurate as of September 02, 2020 11:24 AM. If you have any questions, ask your nurse or doctor.        STOP taking these medications   saccharomyces boulardii 250 MG capsule Commonly known as: FLORASTOR Stopped by: Hollice Espy, MD     TAKE these medications   amLODipine 10 MG tablet Commonly known as: NORVASC Take 10 mg by mouth daily.   ascorbic acid 500 MG tablet Commonly known as: VITAMIN C Take 500 mg by mouth daily.   aspirin 81 MG EC tablet Take 1 tablet (81 mg total) by mouth daily at 6 (six) AM.   atorvastatin 40 MG tablet Commonly known as: LIPITOR 40 mg  daily at 6 PM.   carvedilol 12.5 MG tablet Commonly known as: COREG Take 12.5 mg by mouth at bedtime.   chlorthalidone 25 MG tablet Commonly known as: HYGROTON Take 12.5 mg by mouth every morning.   Eliquis 5 MG Tabs tablet Generic drug: apixaban Take 5 mg by mouth 2 (two) times daily.   finasteride 5 MG tablet Commonly known as: PROSCAR Take 1 tablet (5 mg total) by mouth daily. Started by: Hollice Espy, MD   FreeStyle Libre 2 Sensor Misc by Does not apply route.   HumaLOG KwikPen 100 UNIT/ML KwikPen Generic drug: insulin lispro Inject 10-12 Units into the skin 3 (three) times daily. Inject 10u daily at breakfast, 10u at lunch and up to 12u at supper per SSI   Insulin Pen Needle 33G X 4 MM Misc To use with insulin pen 4 times per day. E 13.9   Lantus SoloStar 100 UNIT/ML Solostar Pen Generic drug: insulin glargine Inject 24 Units into the skin at bedtime.   linagliptin 5 MG Tabs tablet Commonly known as: TRADJENTA Take 5 mg by mouth daily.   losartan 50 MG tablet Commonly known as: COZAAR Take 50 mg by mouth daily.   mycophenolate 250 MG capsule Commonly known as: CELLCEPT Take 750 mg by mouth 2 (two) times daily.   omeprazole 20 MG capsule Commonly known as: PRILOSEC Take 20 mg by mouth daily as needed.   OmniPod Dash 5 Pack Pods Misc   Prograf 1 MG capsule Generic drug: tacrolimus Take 1 mg by mouth 2 (two) times daily.   tamsulosin 0.4 MG Caps capsule Commonly known as: FLOMAX Take 0.4 mg by mouth 2 (two) times daily.   Vitamin D3 50 MCG (2000 UT) capsule Take 2,000 Units by mouth daily.       Allergies: No Known Allergies  Family History: Family History  Problem Relation Age of Onset  . Drug abuse Sister   . Drug abuse Brother     Social History:  reports that he has quit smoking. He has never used smokeless tobacco. He reports that he does not drink alcohol and does not use drugs.   Physical Exam: BP 124/74   Pulse 66   Ht '5\' 8"'$   (1.727 m)   Wt 159 lb (72.1 kg)   BMI 24.18 kg/m   Constitutional:  Alert and oriented, No acute distress.  Accompanied by Patent attorney. HEENT: [place] AT, moist mucus membranes.  Trachea midline, no masses. Cardiovascular: No clubbing, cyanosis, or edema. Respiratory: Normal respiratory effort, no increased work of breathing. GI: Abdomen is soft, nontender, nondistended, no abdominal masses Rectal exam: Normal sphincter tone.  50+ cc prostate, nontender, no nodules. Skin: No rashes, bruises or suspicious lesions. Neurologic: Grossly intact, no focal deficits, moving all 4 extremities. Psychiatric: Normal mood and affect.  Laboratory Data: Lab Results  Component Value Date   WBC 6.5 05/22/2020   HGB 12.3 (L) 05/22/2020   HCT 35.5 (L) 05/22/2020   MCV 86.4 05/22/2020   PLT 169 05/22/2020    Lab Results  Component Value Date   CREATININE 1.03 05/22/2020     Lab Results  Component Value Date   HGBA1C 6.5 (H) 04/27/2020   Results for orders placed or performed in visit on 09/02/20  Bladder Scan (Post Void Residual) in office  Result Value Ref Range   Scan Result 0     Assessment & Plan:    1. Benign prostatic hyperplasia with urinary obstruction Mixed irritative and obstructive urinary symptoms, moderate on Flomax 0.8 mg  We discussed various options for ongoing urinary symptoms.  At this point in time, he is optimized on an alpha-blocker.  We discussed the addition of a 5 alpha reductase medication such as finasteride to help reduce the volume of his prostate versus consideration of procedural/surgical intervention to relieve his outlet.  He is most interested in finasteride.  We discussed possible side effects.  We will have him return in 6 months to reevaluate his urinary symptoms.  We also discussed behavioral modification including avoidance of beverages 4 hours prior to bedtime in the setting of his nighttime symptoms - Bladder Scan (Post Void Residual) in  office  2. 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  Unclear whether this may be related to prostate/bladder irritation or perhaps even related to retrograde ejaculation.  No evidence of infection or any other pathology at this time.  Recommend supportive care.  Return in about 6 months (around 03/02/2021) for ipss/ pvr with PA.  Hollice Espy, MD  [facility] [address] [place],  [address] [phone]

## 2020-10-30 ENCOUNTER — Ambulatory Visit: Admit: 2020-10-30 | Discharge: 2020-10-30 | Payer: MEDICARE | Attending: Nephrology | Primary: Nephrology

## 2020-10-30 ENCOUNTER — Encounter: Admit: 2020-10-30 | Discharge: 2020-10-30 | Payer: MEDICARE

## 2020-10-30 DIAGNOSIS — E559 Vitamin D deficiency, unspecified: Principal | ICD-10-CM

## 2020-10-30 DIAGNOSIS — E119 Type 2 diabetes mellitus without complications: Principal | ICD-10-CM

## 2020-10-30 DIAGNOSIS — Z940 Kidney transplant status: Principal | ICD-10-CM

## 2020-10-30 DIAGNOSIS — Z79899 Other long term (current) drug therapy: Principal | ICD-10-CM

## 2020-11-05 ENCOUNTER — Encounter: Admit: 2020-11-05 | Discharge: 2020-11-06 | Payer: MEDICARE

## 2020-11-21 ENCOUNTER — Encounter (INDEPENDENT_AMBULATORY_CARE_PROVIDER_SITE_OTHER): Payer: Self-pay

## 2020-11-28 DIAGNOSIS — Q249 Congenital malformation of heart, unspecified: Principal | ICD-10-CM

## 2020-11-30 DIAGNOSIS — Z79899 Other long term (current) drug therapy: Principal | ICD-10-CM

## 2020-11-30 DIAGNOSIS — Z940 Kidney transplant status: Principal | ICD-10-CM

## 2020-12-02 ENCOUNTER — Ambulatory Visit (INDEPENDENT_AMBULATORY_CARE_PROVIDER_SITE_OTHER): Payer: Medicare Other | Admitting: Vascular Surgery

## 2020-12-16 ENCOUNTER — Encounter: Admit: 2020-12-16 | Discharge: 2020-12-17 | Payer: MEDICARE

## 2021-01-30 DIAGNOSIS — Z940 Kidney transplant status: Principal | ICD-10-CM

## 2021-03-05 ENCOUNTER — Other Ambulatory Visit: Payer: Self-pay

## 2021-03-05 DIAGNOSIS — N138 Other obstructive and reflux uropathy: Secondary | ICD-10-CM

## 2021-03-05 DIAGNOSIS — N401 Enlarged prostate with lower urinary tract symptoms: Secondary | ICD-10-CM

## 2021-03-06 ENCOUNTER — Other Ambulatory Visit: Payer: Medicare Other

## 2021-03-06 ENCOUNTER — Other Ambulatory Visit: Payer: Self-pay

## 2021-03-06 DIAGNOSIS — N138 Other obstructive and reflux uropathy: Secondary | ICD-10-CM

## 2021-03-07 LAB — PSA: Prostate Specific Ag, Serum: 0.2 ng/mL (ref 0.0–4.0)

## 2021-03-10 NOTE — Progress Notes (Signed)
03/11/21 2:18 PM   [name] May 04, 1956 [id number]  Referring provider:  Frazier Richards, [place],  [place] [address] Chief Complaint  Patient presents with   Benign Prostatic Hypertrophy     HPI: [name] is a 65 y.o.male with a personal history of BPH, UTI who returns today for a 6 month follow-up with IPSS, PVR, PSA.   He is on Flomax 0.8 and Finasteride his symptoms have not improved.  He continues to have both storage related and obstructive urinary symptoms as he was previously despite the addition of finasteride over the past 6 months.  PSA 03/06/2021 was 0.2 down from 0.5   IPSS     Row Name 03/11/21 1000         International Prostate Symptom Score   How often have you had the sensation of not emptying your bladder? Almost always     How often have you had to urinate less than every two hours? Almost always     How often have you found you stopped and started again several times when you urinated? Less than half the time     How often have you found it difficult to postpone urination? Almost always     How often have you had a weak urinary stream? Almost always     How often have you had to strain to start urination? Less than half the time     How many times did you typically get up at night to urinate? 5 Times     Total IPSS Score 29           Quality of Life due to urinary symptoms     If you were to spend the rest of your life with your urinary condition just the way it is now how would you feel about that? Terrible             Score:  1-7 Mild 8-19 Moderate 20-35 Severe    PMH: Past Medical History:  Diagnosis Date   Arthritis    Chronic kidney disease    KIDNEY TRANSPLANT   Coronary artery disease    Diabetes mellitus without complication (HCC)    Dyspnea    GERD (gastroesophageal reflux disease)    History of blood transfusion    Hypertension    Myocardial infarction [facility])     Surgical  History: Past Surgical History:  Procedure Laterality Date   CATARACT EXTRACTION W/PHACO Right 02/03/2017   Procedure: CATARACT EXTRACTION PHACO AND INTRAOCULAR LENS PLACEMENT ([place]);  Surgeon: Leandrew Koyanagi, MD;  Location: [facility] ORS;  Service: Ophthalmology;  Laterality: Right;  [place] 00:35.8 AP% 12.8 CDE 4.57 Fluid lot # [id number]   CORONARY ANGIOPLASTY     STENTS X 4   CORONARY ARTERY BYPASS GRAFT     ENDARTERECTOMY Left 09/06/2019   Procedure: ENDARTERECTOMY CAROTID;  Surgeon: Algernon Huxley, MD;  Location: [facility] ORS;  Service: Vascular;  Laterality: Left;   EYE SURGERY Bilateral    cataract   HIP SURGERY     KIDNEY TRANSPLANT     2016    Home Medications:  Allergies as of 03/11/2021   No Known Allergies      Medication List        Accurate as of March 11, 2021  2:18 PM. If you have any questions, ask your nurse or doctor.          amLODipine 10 MG tablet Commonly known as: NORVASC Take  10 mg by mouth daily.   ascorbic acid 500 MG tablet Commonly known as: VITAMIN C Take 500 mg by mouth daily.   aspirin 81 MG EC tablet Take 1 tablet (81 mg total) by mouth daily at 6 (six) AM.   atorvastatin 40 MG tablet Commonly known as: LIPITOR 40 mg daily at 6 PM.   carvedilol 12.5 MG tablet Commonly known as: COREG Take 12.5 mg by mouth at bedtime.   chlorthalidone 25 MG tablet Commonly known as: HYGROTON Take 12.5 mg by mouth every morning.   Eliquis 5 MG Tabs tablet Generic drug: apixaban Take 5 mg by mouth 2 (two) times daily.   finasteride 5 MG tablet Commonly known as: PROSCAR Take 1 tablet (5 mg total) by mouth daily.   FreeStyle Libre 2 Sensor Misc by Does not apply route.   HumaLOG KwikPen 100 UNIT/ML KwikPen Generic drug: insulin lispro Inject 10-12 Units into the skin 3 (three) times daily. Inject 10u daily at breakfast, 10u at lunch and up to 12u at supper per SSI   Insulin Pen Needle 33G X 4 MM Misc To use with insulin pen 4 times per day. E  13.9   Lantus SoloStar 100 UNIT/ML Solostar Pen Generic drug: insulin glargine Inject 24 Units into the skin at bedtime.   linagliptin 5 MG Tabs tablet Commonly known as: TRADJENTA Take 5 mg by mouth daily.   losartan 50 MG tablet Commonly known as: COZAAR Take 50 mg by mouth daily.   omeprazole 20 MG capsule Commonly known as: PRILOSEC Take 20 mg by mouth daily as needed.   Omnipod DASH Pods (Gen 4) Misc   Prograf 1 MG capsule Generic drug: tacrolimus Take 1 mg by mouth 2 (two) times daily.   tamsulosin 0.4 MG Caps capsule Commonly known as: FLOMAX Take 0.4 mg by mouth 2 (two) times daily.   Vitamin D3 50 MCG (2000 UT) capsule Take 2,000 Units by mouth daily.        Allergies: No Known Allergies  Family History: Family History  Problem Relation Age of Onset   Drug abuse Sister    Drug abuse Brother     Social History:  reports that he has quit smoking. He has never used smokeless tobacco. He reports that he does not drink alcohol and does not use drugs.   Physical Exam: Constitutional:  Alert and oriented, No acute distress. HEENT: [place] AT, moist mucus membranes.  Trachea midline, no masses. Cardiovascular: No clubbing, cyanosis, or edema. Respiratory: Normal respiratory effort, no increased work of breathing. Skin: No rashes, bruises or suspicious lesions. Neurologic: Grossly intact, no focal deficits, moving all 4 extremities. Psychiatric: Normal mood and affect.  Pertinent Imaging: Results for orders placed or performed in visit on 03/11/21  BLADDER SCAN AMB NON-IMAGING  Result Value Ref Range   Scan Result 0 ml     Assessment & Plan:    Benign prostatic hyperplasia with urinary obstruction   - IPSS score of 29 today, poorly controlled urinary symptoms despite maximal medical therapy  -continue these meds for the time being, flomax 0.8 and finasteride 5 mg for the time being  - Recommend a cystoscopy and TRUS to evaluate prostatic anatomy as  well as assess whether or not he would benefit from an outlet procedure.  He is agreeable this plan.   Follow-up with cystoscopy and TRUS   Conley Rolls as a scribe for Hollice Espy, MD.,have documented all relevant documentation on the behalf of Hollice Espy, MD,as directed  by  Hollice Espy, MD while in the presence of Hollice Espy, MD.  I have reviewed the above documentation for accuracy and completeness, and I agree with the above.   Hollice Espy, MD    [facility] [address] [place], [place] [address] [phone]

## 2021-03-11 ENCOUNTER — Encounter: Payer: Self-pay | Admitting: Urology

## 2021-03-11 ENCOUNTER — Ambulatory Visit: Payer: Medicare Other | Admitting: Urology

## 2021-03-11 ENCOUNTER — Other Ambulatory Visit: Payer: Self-pay

## 2021-03-11 DIAGNOSIS — N401 Enlarged prostate with lower urinary tract symptoms: Secondary | ICD-10-CM | POA: Diagnosis not present

## 2021-03-11 DIAGNOSIS — N138 Other obstructive and reflux uropathy: Secondary | ICD-10-CM | POA: Diagnosis not present

## 2021-03-11 LAB — BLADDER SCAN AMB NON-IMAGING: Scan Result: 0

## 2021-03-11 NOTE — Patient Instructions (Signed)
Cystoscopy Cystoscopy is a procedure that is used to help diagnose and sometimes treat conditions that affect the lower urinary tract. The lower urinary tract includes the bladder and the urethra. The urethra is the tube that drains urine from the bladder. Cystoscopy is done using a thin, tube-shaped instrument with a light and camera at the end (cystoscope). The cystoscope may be hard or flexible, depending on the goal of the procedure. The cystoscope is inserted through the urethra, into the bladder. Cystoscopy may be recommended if you have: Urinary tract infections that keep coming back. Blood in the urine (hematuria). An inability to control when you urinate (urinary incontinence) or an overactive bladder. Unusual cells found in a urine sample. A blockage in the urethra, such as a urinary stone. Painful urination. An abnormality in the bladder found during an intravenous pyelogram (IVP) or CT scan. Cystoscopy may also be done to remove a sample of tissue to be examined under a microscope (biopsy). What are the risks? Generally, this is a safe procedure. However, problems may occur, including: Infection. Bleeding.  What happens during the procedure?  You will be given one or more of the following: A medicine to numb the area (local anesthetic). The area around the opening of your urethra will be cleaned. The cystoscope will be passed through your urethra into your bladder. Germ-free (sterile) fluid will flow through the cystoscope to fill your bladder. The fluid will stretch your bladder so that your health care provider can clearly examine your bladder walls. Your doctor will look at the urethra and bladder. The cystoscope will be removed The procedure may vary among health care providers  What can I expect after the procedure? After the procedure, it is common to have: Some soreness or pain in your abdomen and urethra. Urinary symptoms. These include: Mild pain or burning when you  urinate. Pain should stop within a few minutes after you urinate. This may last for up to 1 week. A small amount of blood in your urine for several days. Feeling like you need to urinate but producing only a small amount of urine. Follow these instructions at home: General instructions Return to your normal activities as told by your health care provider.  Do not drive for 24 hours if you were given a sedative during your procedure. Watch for any blood in your urine. If the amount of blood in your urine increases, call your health care provider. If a tissue sample was removed for testing (biopsy) during your procedure, it is up to you to get your test results. Ask your health care provider, or the department that is doing the test, when your results will be ready. Drink enough fluid to keep your urine pale yellow. Keep all follow-up visits as told by your health care provider. This is important. Contact a health care provider if you: Have pain that gets worse or does not get better with medicine, especially pain when you urinate. Have trouble urinating. Have more blood in your urine. Get help right away if you: Have blood clots in your urine. Have abdominal pain. Have a fever or chills. Are unable to urinate. Summary Cystoscopy is a procedure that is used to help diagnose and sometimes treat conditions that affect the lower urinary tract. Cystoscopy is done using a thin, tube-shaped instrument with a light and camera at the end. After the procedure, it is common to have some soreness or pain in your abdomen and urethra. Watch for any blood in your urine.   If the amount of blood in your urine increases, call your health care provider. If you were prescribed an antibiotic medicine, take it as told by your health care provider. Do not stop taking the antibiotic even if you start to feel better. This information is not intended to replace advice given to you by your health care provider. Make  sure you discuss any questions you have with your health care provider. Document Revised: 07/11/2018 Document Reviewed: 07/11/2018 Elsevier Patient Education  [employer] [place].   Transrectal Ultrasound A transrectal ultrasound is a procedure that uses sound waves to create images of the prostate gland and nearby tissues. For this procedure, an ultrasound probe is placed in the rectum. The probe sends sound waves through the wall of the rectum into the prostate gland, which is located in front of the rectum. The images show the size and shape of the prostate gland and nearby structures. You may need this test if you have: Trouble urinating. Trouble getting your partner pregnant (infertility). An abnormal result from a prostate screening exam. Tell a health care provider about: Any allergies you have. All medicines you are taking, including vitamins, herbs, eye drops, creams, and over-the-counter medicines. Any blood disorders you have. Any medical conditions you have. Any surgeries you have had. What are the risks? Generally, this is a safe procedure. However, problems may occur, including: Discomfort during the procedure. This is rare. Blood in your urine or sperm after the procedure. This may occur if a sample of tissue (biopsy) is taken during the procedure. What happens before the procedure? Your health care provider may instruct you to use an enema 1-4 hours before the procedure. Follow instructions from your health care provider about how to do the enema. Ask your health care provider about: Changing or stopping your regular medicines. This is especially important if you are taking diabetes medicines or blood thinners. Taking medicines such as aspirin and ibuprofen. These medicines can thin your blood. Do not take these medicines unless your health care provider tells you to take them. Taking over-the-counter medicines, vitamins, herbs, and supplements. What happens during the  procedure? You will be asked to lie down on your left side on an exam table. You will bend your knees toward your chest. Gel will be put on a probe, and the probe will be gently inserted into your rectum. This may cause a feeling of fullness. The probe will send signals to a computer that will create images. These will be displayed on a monitor that looks like a small television screen. The technician will slightly rotate the probe throughout the procedure. While rotating the probe, he or she will view and capture images of the prostate gland and the surrounding structures from different angles. Your health care provider may take a biopsy sample of prostate tissue during the procedure. The images captured from the ultrasound will help guide the needle used to remove a sample of tissue. The sample will be sent to a lab for testing. The probe will be removed. The procedure may vary among health care providers and hospitals. What can I expect after the procedure? It is up to you to get the results of your procedure. Ask your health care provider, or the department that is doing the procedure, when your results will be ready. Keep all follow-up visits as told by your health care provider. This is important. Summary A transrectal ultrasound is a procedure that uses sound waves to create images of the prostate gland  and nearby tissues. The images show the size and shape of the prostate gland and nearby structures. Before the procedure, ask your health care provider about changing or stopping your regular medicines. This is especially important if you are taking diabetes medicines or blood thinners. This information is not intended to replace advice given to you by your health care provider. Make sure you discuss any questions you have with your healthcare provider. Document Revised: 09/12/2019 Document Reviewed: 09/12/2019 Elsevier Patient Education  2022 [employer].

## 2021-03-31 DIAGNOSIS — Z79899 Other long term (current) drug therapy: Principal | ICD-10-CM

## 2021-03-31 DIAGNOSIS — Z940 Kidney transplant status: Principal | ICD-10-CM

## 2021-04-01 DIAGNOSIS — Z940 Kidney transplant status: Principal | ICD-10-CM

## 2021-04-01 DIAGNOSIS — Z0000 Encounter for general adult medical examination without abnormal findings: Principal | ICD-10-CM

## 2021-04-02 ENCOUNTER — Ambulatory Visit: Admit: 2021-04-02 | Discharge: 2021-04-03 | Payer: MEDICARE

## 2021-04-02 ENCOUNTER — Ambulatory Visit: Admit: 2021-04-02 | Discharge: 2021-04-03 | Payer: MEDICARE | Attending: Nephrology | Primary: Nephrology

## 2021-04-02 DIAGNOSIS — Z79899 Other long term (current) drug therapy: Principal | ICD-10-CM

## 2021-04-02 DIAGNOSIS — Z940 Kidney transplant status: Principal | ICD-10-CM

## 2021-04-13 NOTE — Progress Notes (Signed)
   04/13/2021   Chief Complaint  Patient presents with   Benign Prostatic Hypertrophy     HPI: [name] is a 65 y.o. male with a personal of BPH and UTIs, who presents today for a cystoscopy/TRUS.   He is currently taking flomax and finasteride.   Recent PSA on 03/06/2021 was 0.2.   PSA trend : Component Prostate Specific Ag, Serum  Latest Ref Rng & Units 0.0 - 4.0 ng/mL  08/29/2020 0.5  03/06/2021 0.2      Vitals:   04/14/21 1034  BP: (!) 142/68  Pulse: 88   NED. A&Ox3.   No respiratory distress   Abd soft, NT, ND Normal phallus with bilateral descended testicles    Cystoscopy Procedure Note  Patient identification was confirmed, informed consent was obtained, and patient was prepped using Betadine solution.  Lidocaine jelly was administered per urethral meatus.    Preoperative abx where received prior to procedure.     Pre-Procedure: - Inspection reveals a normal caliber ureteral meatus.  Procedure: The flexible cystoscope was introduced without difficulty - No urethral strictures/lesions are present. - Normal prostate  - Normal bladder neck - Bilateral ureteral orifices identified - Bladder mucosa  reveals no ulcers, tumors, or lesions - No bladder stones - No trabeculation - At right dome, there was an opening consistent with his ureteral reimplant  Retroflexion shows unremarkable    Post-Procedure: - Patient tolerated the procedure well   Prostate transrectal ultrasound sizing   Informed consent was obtained after discussing risks/benefits of the procedure.  A time out was performed to ensure correct patient identity.   Pre-Procedure: -Transrectal probe was placed without difficulty -Transrectal Ultrasound performed revealing a 43 gm prostate measuring 2.87 x 4.78 x 4.79 cm (length) -No significant hypoechoic or median lobe noted      Assessment/ Plan:  BPH with urinary obstruction - 1 dose of Keflex given today for prophylaxis   given risk (transplant) - Given the option to consider Urolift / TURP or to try a storage medication like oxybutynin vs. Observation vs. Urodynamics -interested in OAB meds; side effects of medicine like dry eyes, dry mouth, and constipation were reviewed. He wants to proceed with medication.  - Oxybutynin 10 mg XL prescribed  - continue Flomax and finasteride   F/u 3 months with IPSS/ PVR  I,Kailey Littlejohn,acting as a scribe for Hollice Espy, MD.,have documented all relevant documentation on the behalf of Hollice Espy, MD,as directed by  Hollice Espy, MD while in the presence of Hollice Espy, MD.   I have reviewed the above documentation for accuracy and completeness, and I agree with the above.   Hollice Espy, MD

## 2021-04-14 ENCOUNTER — Ambulatory Visit (INDEPENDENT_AMBULATORY_CARE_PROVIDER_SITE_OTHER): Payer: Medicare Other | Admitting: Urology

## 2021-04-14 ENCOUNTER — Other Ambulatory Visit: Payer: Self-pay

## 2021-04-14 ENCOUNTER — Encounter: Payer: Self-pay | Admitting: Urology

## 2021-04-14 VITALS — BP 142/68 | HR 88 | Ht 68.0 in | Wt 159.0 lb

## 2021-04-14 DIAGNOSIS — N401 Enlarged prostate with lower urinary tract symptoms: Secondary | ICD-10-CM | POA: Diagnosis not present

## 2021-04-14 DIAGNOSIS — N138 Other obstructive and reflux uropathy: Secondary | ICD-10-CM | POA: Diagnosis not present

## 2021-04-14 MED ORDER — CEPHALEXIN 250 MG PO CAPS
500.0000 mg | ORAL_CAPSULE | Freq: Once | ORAL | Status: AC
  Administered 2021-04-14: 500 mg via ORAL

## 2021-04-14 MED ORDER — OXYBUTYNIN CHLORIDE ER 10 MG PO TB24: 10.0000 mg | ORAL_TABLET | Freq: Every day | ORAL | 11 refills | Status: DC

## 2021-04-15 LAB — URINALYSIS, COMPLETE
Bilirubin, UA: NEGATIVE
Glucose, UA: NEGATIVE
Ketones, UA: NEGATIVE
Leukocytes,UA: NEGATIVE
Nitrite, UA: NEGATIVE
Protein,UA: NEGATIVE
RBC, UA: NEGATIVE
Specific Gravity, UA: 1.015 (ref 1.005–1.030)
Urobilinogen, Ur: 1 mg/dL (ref 0.2–1.0)
pH, UA: 6 (ref 5.0–7.5)

## 2021-04-15 LAB — MICROSCOPIC EXAMINATION
Bacteria, UA: NONE SEEN
Epithelial Cells (non renal): NONE SEEN /hpf (ref 0–10)
RBC, Urine: NONE SEEN /hpf (ref 0–2)

## 2021-04-29 ENCOUNTER — Encounter: Payer: Self-pay | Admitting: Urology

## 2021-04-30 ENCOUNTER — Ambulatory Visit: Payer: Medicare Other | Admitting: Physician Assistant

## 2021-04-30 ENCOUNTER — Other Ambulatory Visit: Payer: Self-pay

## 2021-04-30 VITALS — BP 123/62 | HR 61 | Ht 68.0 in | Wt 167.0 lb

## 2021-04-30 DIAGNOSIS — N138 Other obstructive and reflux uropathy: Secondary | ICD-10-CM | POA: Diagnosis not present

## 2021-04-30 DIAGNOSIS — N401 Enlarged prostate with lower urinary tract symptoms: Secondary | ICD-10-CM | POA: Diagnosis not present

## 2021-04-30 LAB — BLADDER SCAN AMB NON-IMAGING

## 2021-04-30 MED ORDER — MIRABEGRON ER 25 MG PO TB24: 25.0000 mg | ORAL_TABLET | Freq: Every day | ORAL | 0 refills | Status: DC

## 2021-04-30 NOTE — Patient Instructions (Signed)
STOP oxybutynin (Ditropan). CONTINUE tamsulosin (Flomax) and finasteride (Proscar). START mirabegron (Myrbetriq) one time daily.

## 2021-04-30 NOTE — Telephone Encounter (Signed)
I spoke with patient regarding my chart message. He not having any discomfort at this time just pressure. He is urinating but not as much as he was. Pt is scheduled to come in this afternoon at 2pm. Pt aware.

## 2021-04-30 NOTE — Progress Notes (Signed)
04/30/2021 3:14 PM   [name] January 21, 1956 [id number]  CC: Chief Complaint  Patient presents with   Benign Prostatic Hypertrophy   HPI: [name] is a 65 y.o. male with PMH BPH with urinary obstruction on Flomax, finasteride, and recently started on oxybutynin XL 10 mg after undergoing cystoscopy TRUS with Dr. Erlene Quan 16 days ago who presents today for evaluation of difficulty urinating.  Patient declined interpreter today  Today he reports several days of difficulty urinating and increased straining without lower abdominal pain.  In-office UA today positive for trace glucose and trace intact blood; urine microscopy pan negative. PVR >15m.  PMH: Past Medical History:  Diagnosis Date   Arthritis    Chronic kidney disease    KIDNEY TRANSPLANT   Coronary artery disease    Diabetes mellitus without complication (HCC)    Dyspnea    GERD (gastroesophageal reflux disease)    History of blood transfusion    Hypertension    Myocardial infarction ([facility]     Surgical History: Past Surgical History:  Procedure Laterality Date   CATARACT EXTRACTION W/PHACO Right 02/03/2017   Procedure: CATARACT EXTRA[place] PHACO [place]OC[place] PLAC[place]  Surgeon: BLeandrew Koyanagi MD;  Location: [facility] ORS;  Service: Ophthalmology;  Laterality: Right;  [place]:35.8 AP% 12.8 CDE 4.57 Fluid lot # [id number]   CORONARY ANGIOPLASTY     STENTS X 4   CORONARY ARTERY BYPASS GRAFT     ENDARTERECTOMY Left 09/06/2019   Procedure: ENDARTERECTOMY CAROTID;  Surgeon: DAlgernon Huxley MD;  Location: [facility] ORS;  Service: Vascular;  Laterality: Left;   EYE SURGERY Bilateral    cataract   HIP SURGERY     KIDNEY TRANSPLANT     2016    Home Medications:  Allergies as of 04/30/2021   No Known Allergies      Medication List        Accurate as of April 30, 2021  3:14 PM. If you have any questions, ask your nurse or doctor.          STOP taking these medications     oxybutynin 10 MG 24 hr tablet Commonly known as: DITROPAN-XL Stopped by: SDebroah Loop PA-C       TAKE these medications    amLODipine 10 MG tablet Commonly known as: NORVASC Take 10 mg by mouth daily.   ascorbic acid 500 MG tablet Commonly known as: VITAMIN C Take 500 mg by mouth daily.   aspirin 81 MG EC tablet Take 1 tablet (81 mg total) by mouth daily at 6 (six) AM.   atorvastatin 40 MG tablet Commonly known as: LIPITOR 40 mg daily at 6 PM.   carvedilol 12.5 MG tablet Commonly known as: COREG Take 12.5 mg by mouth at bedtime.   chlorthalidone 25 MG tablet Commonly known as: HYGROTON Take 12.5 mg by mouth every morning.   Eliquis 5 MG Tabs tablet Generic drug: apixaban Take 5 mg by mouth 2 (two) times daily.   finasteride 5 MG tablet Commonly known as: PROSCAR Take 1 tablet (5 mg total) by mouth daily.   FreeStyle Libre 2 Sensor Misc by Does not apply route.   HumaLOG KwikPen 100 UNIT/ML KwikPen Generic drug: insulin lispro Inject 10-12 Units into the skin 3 (three) times daily. Inject 10u daily at breakfast, 10u at lunch and up to 12u at supper per SSI   Insulin Pen Needle 33G X 4 MM Misc To use with insulin pen 4 times per day. E 13.9  Lantus SoloStar 100 UNIT/ML Solostar Pen Generic drug: insulin glargine Inject 24 Units into the skin at bedtime.   linagliptin 5 MG Tabs tablet Commonly known as: TRADJENTA Take 5 mg by mouth daily.   losartan 50 MG tablet Commonly known as: COZAAR Take 50 mg by mouth daily.   mirabegron ER 25 MG Tb24 tablet Commonly known as: MYRBETRIQ Take 1 tablet (25 mg total) by mouth daily. Started by: Debroah Loop, PA-C   omeprazole 20 MG capsule Commonly known as: PRILOSEC Take 20 mg by mouth daily as needed.   Omnipod DASH Pods (Gen 4) Misc   Prograf 1 MG capsule Generic drug: tacrolimus Take 1 mg by mouth 2 (two) times daily.   tamsulosin 0.4 MG Caps capsule Commonly known as:  FLOMAX Take 0.4 mg by mouth 2 (two) times daily.   Vitamin D3 50 MCG (2000 UT) capsule Take 2,000 Units by mouth daily.        Allergies:  No Known Allergies  Family History: Family History  Problem Relation Age of Onset   Drug abuse Sister    Drug abuse Brother     Social History:   reports that he has quit smoking. He has never used smokeless tobacco. He reports that he does not drink alcohol and does not use drugs.  Physical Exam: BP 123/62   Pulse 61   Ht '5\' 8"'$  (1.727 m)   Wt 167 lb (75.8 kg)   BMI 25.39 kg/m   Constitutional:  Alert and oriented, no acute distress, nontoxic appearing HEENT: [place], AT Cardiovascular: No clubbing, cyanosis, or edema Respiratory: Normal respiratory effort, no increased work of breathing Skin: No rashes, bruises or suspicious lesions Neurologic: Grossly intact, no focal deficits, moving all 4 extremities Psychiatric: Normal mood and affect  Laboratory Data: Results for orders placed or performed in visit on 04/30/21  Microscopic Examination   Urine  Result Value Ref Range   WBC, UA 0-5 0 - 5 /hpf   RBC 0-2 0 - 2 /hpf   Epithelial Cells (non renal) None seen 0 - 10 /hpf   Bacteria, UA None seen None seen/Few  Urinalysis, Complete  Result Value Ref Range   Specific Gravity, UA 1.015 1.005 - 1.030   pH, UA 6.5 5.0 - 7.5   Color, UA Yellow Yellow   Appearance Ur Clear Clear   Leukocytes,UA Negative Negative   Protein,UA Negative Negative/Trace   Glucose, UA Trace (A) Negative   Ketones, UA Negative Negative   RBC, UA Trace (A) Negative   Bilirubin, UA Negative Negative   Urobilinogen, Ur 1.0 0.2 - 1.0 mg/dL   Nitrite, UA Negative Negative   Microscopic Examination See below:   Bladder Scan (Post Void Residual) in office  Result Value Ref Range   Scan Result >171m    Assessment & Plan:   1. Benign prostatic hyperplasia with urinary obstruction Increased straining and difficulty urinating on oxybutynin.  UA is benign and  he is not in overt retention today.  We will stop oxybutynin and I offered him a trial of Myrbetriq 25 mg daily with plans for symptom recheck and PVR in 4 weeks.  Patient is in agreement with this plan. - Urinalysis, Complete - Bladder Scan (Post Void Residual) in office - mirabegron ER (MYRBETRIQ) 25 MG TB24 tablet; Take 1 tablet (25 mg total) by mouth daily.  Dispense: 28 tablet; Refill: 0  Return in about 4 weeks (around 05/28/2021) for Symptom recheck with PVR.  SDebroah Loop PA-C  [facility] [address], [place], [place] [address] [phone]

## 2021-05-01 LAB — URINALYSIS, COMPLETE
Bilirubin, UA: NEGATIVE
Ketones, UA: NEGATIVE
Leukocytes,UA: NEGATIVE
Nitrite, UA: NEGATIVE
Protein,UA: NEGATIVE
Specific Gravity, UA: 1.015 (ref 1.005–1.030)
Urobilinogen, Ur: 1 mg/dL (ref 0.2–1.0)
pH, UA: 6.5 (ref 5.0–7.5)

## 2021-05-01 LAB — MICROSCOPIC EXAMINATION
Bacteria, UA: NONE SEEN
Epithelial Cells (non renal): NONE SEEN /hpf (ref 0–10)

## 2021-05-28 ENCOUNTER — Other Ambulatory Visit: Payer: Self-pay

## 2021-05-28 ENCOUNTER — Ambulatory Visit: Payer: Medicare Other | Admitting: Physician Assistant

## 2021-05-28 VITALS — BP 131/62 | HR 65 | Ht 68.0 in | Wt 164.0 lb

## 2021-05-28 DIAGNOSIS — N138 Other obstructive and reflux uropathy: Secondary | ICD-10-CM | POA: Diagnosis not present

## 2021-05-28 DIAGNOSIS — N401 Enlarged prostate with lower urinary tract symptoms: Secondary | ICD-10-CM | POA: Diagnosis not present

## 2021-05-28 LAB — BLADDER SCAN AMB NON-IMAGING: Scan Result: 0

## 2021-05-28 MED ORDER — MIRABEGRON ER 25 MG PO TB24: 25.0000 mg | ORAL_TABLET | Freq: Every day | ORAL | 11 refills | Status: DC

## 2021-05-29 NOTE — Progress Notes (Signed)
05/28/2021 9:56 AM   [name] [birth year] [id number]  CC: Chief Complaint  Patient presents with   Benign Prostatic Hypertrophy   HPI: [name] is a 65 y.o. male with PMH BPH with urinary obstruction on Flomax and finasteride and storage related symptoms who developed incomplete bladder emptying on oxybutynin who presents today for symptom recheck on Myrbetriq 25 mg daily.   Today he reports his urinary symptoms are approximately 50% improved.  He is now voiding every 4-5 hours overnight.  He feels he is emptying more successfully than prior.  Overall, he is very pleased with his progress.  PVR 0 mL.  PMH: Past Medical History:  Diagnosis Date   Arthritis    Chronic kidney disease    KIDNEY TRANSPLANT   Coronary artery disease    Diabetes mellitus without complication (HCC)    Dyspnea    GERD (gastroesophageal reflux disease)    History of blood transfusion    Hypertension    Myocardial infarction [facility])     Surgical History: Past Surgical History:  Procedure Laterality Date   CATARACT EXTRACTION W/PHACO Right 02/03/2017   Procedure: CATARACT EXTRACTION PHACO AND INTRAOCULAR LENS PLACEMENT ([place]);  Surgeon: Leandrew Koyanagi, MD;  Location: [facility] ORS;  Service: Ophthalmology;  Laterality: Right;  [place] 00:35.8 AP% 12.8 CDE 4.57 Fluid lot # [id number]   CORONARY ANGIOPLASTY     STENTS X 4   CORONARY ARTERY BYPASS GRAFT     ENDARTERECTOMY Left 09/06/2019   Procedure: ENDARTERECTOMY CAROTID;  Surgeon: Algernon Huxley, MD;  Location: [facility] ORS;  Service: Vascular;  Laterality: Left;   EYE SURGERY Bilateral    cataract   HIP SURGERY     KIDNEY TRANSPLANT     2016    Home Medications:  Allergies as of 05/28/2021   No Known Allergies      Medication List        Accurate as of May 28, 2021 11:59 PM. If you have any questions, ask your nurse or doctor.          amLODipine 10 MG tablet Commonly known as: NORVASC Take 10 mg by mouth daily.    ascorbic acid 500 MG tablet Commonly known as: VITAMIN C Take 500 mg by mouth daily.   aspirin 81 MG EC tablet Take 1 tablet (81 mg total) by mouth daily at 6 (six) AM.   atorvastatin 40 MG tablet Commonly known as: LIPITOR 40 mg daily at 6 PM.   carvedilol 12.5 MG tablet Commonly known as: COREG Take 12.5 mg by mouth at bedtime.   chlorthalidone 25 MG tablet Commonly known as: HYGROTON Take 12.5 mg by mouth every morning.   Eliquis 5 MG Tabs tablet Generic drug: apixaban Take 5 mg by mouth 2 (two) times daily.   finasteride 5 MG tablet Commonly known as: PROSCAR Take 1 tablet (5 mg total) by mouth daily.   FreeStyle Libre 2 Sensor Misc by Does not apply route.   HumaLOG KwikPen 100 UNIT/ML KwikPen Generic drug: insulin lispro Inject 10-12 Units into the skin 3 (three) times daily. Inject 10u daily at breakfast, 10u at lunch and up to 12u at supper per SSI   Insulin Pen Needle 33G X 4 MM Misc To use with insulin pen 4 times per day. E 13.9   Lantus SoloStar 100 UNIT/ML Solostar Pen Generic drug: insulin glargine Inject 24 Units into the skin at bedtime.   linagliptin 5 MG Tabs tablet Commonly known as: TRADJENTA Take 5  mg by mouth daily.   losartan 50 MG tablet Commonly known as: COZAAR Take 50 mg by mouth daily.   mirabegron ER 25 MG Tb24 tablet Commonly known as: MYRBETRIQ Take 1 tablet (25 mg total) by mouth daily.   omeprazole 20 MG capsule Commonly known as: PRILOSEC Take 20 mg by mouth daily as needed.   Omnipod DASH Pods (Gen 4) Misc   Prograf 1 MG capsule Generic drug: tacrolimus Take 1 mg by mouth 2 (two) times daily.   tamsulosin 0.4 MG Caps capsule Commonly known as: FLOMAX Take 0.4 mg by mouth 2 (two) times daily.   Vitamin D3 50 MCG (2000 UT) capsule Take 2,000 Units by mouth daily.        Allergies:  No Known Allergies  Family History: Family History  Problem Relation Age of Onset   Drug abuse Sister    Drug abuse  Brother     Social History:   reports that he has quit smoking. He has never used smokeless tobacco. He reports that he does not drink alcohol and does not use drugs.  Physical Exam: BP 131/62   Pulse 65   Ht 5\' 8"  (1.727 m)   Wt 164 lb (74.4 kg)   BMI 24.94 kg/m   Constitutional:  Alert and oriented, no acute distress, nontoxic appearing HEENT: [place], AT Cardiovascular: No clubbing, cyanosis, or edema Respiratory: Normal respiratory effort, no increased work of breathing Skin: No rashes, bruises or suspicious lesions Neurologic: Grossly intact, no focal deficits, moving all 4 extremities Psychiatric: Normal mood and affect  Laboratory Data: Results for orders placed or performed in visit on 05/28/21  BLADDER SCAN AMB NON-IMAGING  Result Value Ref Range   Scan Result 0 ml    Assessment & Plan:   1. Benign prostatic hyperplasia with urinary obstruction Significant symptomatic improvement of storage related symptoms and nocturia on Myrbetriq 25 mg daily.  We discussed that we may increase his dose in the future if he continues to feel there is room for improvement.  Patient to continue Flomax and finasteride in addition.  Patient is in agreement with this plan. - BLADDER SCAN AMB NON-IMAGING - mirabegron ER (MYRBETRIQ) 25 MG TB24 tablet; Take 1 tablet (25 mg total) by mouth daily.  Dispense: 30 tablet; Refill: 11  Return if symptoms worsen or fail to improve.  Debroah Loop, PA-C  [facility] [address], [place], [place] [address] [phone]

## 2021-07-14 ENCOUNTER — Ambulatory Visit: Payer: Medicare Other | Admitting: Urology

## 2021-07-30 ENCOUNTER — Other Ambulatory Visit (INDEPENDENT_AMBULATORY_CARE_PROVIDER_SITE_OTHER): Payer: Self-pay | Admitting: Nurse Practitioner

## 2021-07-30 DIAGNOSIS — N186 End stage renal disease: Secondary | ICD-10-CM

## 2021-07-30 DIAGNOSIS — Z940 Kidney transplant status: Principal | ICD-10-CM

## 2021-07-30 MED ORDER — CELLCEPT 250 MG CAPSULE
ORAL_CAPSULE | Freq: Two times a day (BID) | ORAL | 3 refills | 90 days | Status: CP
Start: 2021-07-30 — End: 2022-07-30

## 2021-07-31 ENCOUNTER — Encounter (INDEPENDENT_AMBULATORY_CARE_PROVIDER_SITE_OTHER): Payer: Medicare Other

## 2021-07-31 ENCOUNTER — Ambulatory Visit (INDEPENDENT_AMBULATORY_CARE_PROVIDER_SITE_OTHER): Payer: Medicare Other | Admitting: Vascular Surgery

## 2021-08-31 ENCOUNTER — Ambulatory Visit: Payer: Medicare Other | Admitting: Physician Assistant

## 2021-08-31 DIAGNOSIS — Z940 Kidney transplant status: Principal | ICD-10-CM

## 2021-09-07 DIAGNOSIS — Z940 Kidney transplant status: Principal | ICD-10-CM

## 2021-09-07 MED ORDER — CELLCEPT 250 MG CAPSULE
ORAL_CAPSULE | Freq: Two times a day (BID) | ORAL | 3 refills | 90 days | Status: CP
Start: 2021-09-07 — End: 2022-09-07
  Filled 2021-11-12: qty 180, 30d supply, fill #0

## 2021-09-08 DIAGNOSIS — Z940 Kidney transplant status: Principal | ICD-10-CM

## 2021-09-08 NOTE — Progress Notes (Signed)
09/09/2021 11:20 AM   [name] 1955-11-13 [id number]  Referring provider: Frazier Richards, [address] [place],  [place] [address]  Chief Complaint  Patient presents with   Benign Prostatic Hypertrophy   Urological history: 1. BPH with LU TS -PSA 0.2 in 03/2021 -prostate volume 43 gram TRUS 2022 -cysto 2022 NED -I PSS 10/4 -PVR 0 mL -managed with finasteride 5 mg daily and tamsulosin 0.4 mg daily   2. Nocturia -Risk factors for nocturia: hypertension, diabetes, heart and BPH  HPI: [name] is a 66 y.o. male who presents today for one month follow up after a addition of Myrbetriq 25 mg with his tamsulosin and finasteride 5 mg.   He is at goal with Myrbetriq 25 mg daily, but it is expensive.  He could not not continue to take this medication.  It is costing him $56 a month.  He still continues to get up 4-5 times nightly.  He states he has issues with his sinuses and breathing, but he denied any sleep apnea.  Patient denies any modifying or aggravating factors.  Patient denies any gross hematuria, dysuria or suprapubic/flank pain.  Patient denies any fevers, chills, nausea or vomiting.     IPSS     Row Name 09/09/21 1100         International Prostate Symptom Score   How often have you had the sensation of not emptying your bladder? Not at All     How often have you had to urinate less than every two hours? Not at All     How often have you found you stopped and started again several times when you urinated? Not at All     How often have you found it difficult to postpone urination? Not at All     How often have you had a weak urinary stream? Almost always     How often have you had to strain to start urination? Not at All     How many times did you typically get up at night to urinate? 5 Times     Total IPSS Score 10       Quality of Life due to urinary symptoms   If you were to spend the rest of your life with your urinary condition just  the way it is now how would you feel about that? Mostly Disatisfied              Score:  1-7 Mild 8-19 Moderate 20-35 Severe    PMH: Past Medical History:  Diagnosis Date   Arthritis    Chronic kidney disease    KIDNEY TRANSPLANT   Coronary artery disease    Diabetes mellitus without complication (HCC)    Dyspnea    GERD (gastroesophageal reflux disease)    History of blood transfusion    Hypertension    Myocardial infarction [facility])     Surgical History: Past Surgical History:  Procedure Laterality Date   CATARACT EXTRACTION W/PHACO Right 02/03/2017   Procedure: CATARACT EXTRACTION PHACO AND INTRAOCULAR LENS PLACEMENT ([place]);  Surgeon: Leandrew Koyanagi, MD;  Location: [facility] ORS;  Service: Ophthalmology;  Laterality: Right;  [place] 00:35.8 AP% 12.8 CDE 4.57 Fluid lot # [id number]   CORONARY ANGIOPLASTY     STENTS X 4   CORONARY ARTERY BYPASS GRAFT     ENDARTERECTOMY Left 09/06/2019   Procedure: ENDARTERECTOMY CAROTID;  Surgeon: Algernon Huxley, MD;  Location: [facility] ORS;  Service: Vascular;  Laterality: Left;  EYE SURGERY Bilateral    cataract   HIP SURGERY     KIDNEY TRANSPLANT     2016    Home Medications:  Allergies as of 09/09/2021       Reactions   Oxybutynin Other (See Comments)   Incomplete bladder emptying        Medication List        Accurate as of September 09, 2021 11:20 AM. If you have any questions, ask your nurse or doctor.          STOP taking these medications    mirabegron ER 25 MG Tb24 tablet Commonly known as: MYRBETRIQ Stopped by: Zara Council, PA-C       TAKE these medications    amLODipine 10 MG tablet Commonly known as: NORVASC Take 10 mg by mouth daily.   ascorbic acid 500 MG tablet Commonly known as: VITAMIN C Take 500 mg by mouth daily.   aspirin 81 MG EC tablet Take 1 tablet (81 mg total) by mouth daily at 6 (six) AM.   atorvastatin 40 MG tablet Commonly known as: LIPITOR 40 mg daily at 6 PM.   carvedilol 12.5  MG tablet Commonly known as: COREG Take 12.5 mg by mouth at bedtime.   CellCept 250 MG capsule Generic drug: mycophenolate Take 250 mg by mouth 2 (two) times daily.   chlorthalidone 25 MG tablet Commonly known as: HYGROTON Take 12.5 mg by mouth every morning.   Eliquis 5 MG Tabs tablet Generic drug: apixaban Take 5 mg by mouth 2 (two) times daily.   finasteride 5 MG tablet Commonly known as: PROSCAR Take 1 tablet (5 mg total) by mouth daily.   FreeStyle Libre 2 Sensor Misc by Does not apply route.   HumaLOG KwikPen 100 UNIT/ML KwikPen Generic drug: insulin lispro Inject 10-12 Units into the skin 3 (three) times daily. Inject 10u daily at breakfast, 10u at lunch and up to 12u at supper per SSI   Insulin Pen Needle 33G X 4 MM Misc To use with insulin pen 4 times per day. E 13.9   Lantus SoloStar 100 UNIT/ML Solostar Pen Generic drug: insulin glargine Inject 24 Units into the skin at bedtime.   linagliptin 5 MG Tabs tablet Commonly known as: TRADJENTA Take 5 mg by mouth daily.   losartan 50 MG tablet Commonly known as: COZAAR Take 50 mg by mouth daily.   omeprazole 20 MG capsule Commonly known as: PRILOSEC Take 20 mg by mouth daily as needed.   Omnipod DASH Pods (Gen 4) Misc   Prograf 1 MG capsule Generic drug: tacrolimus Take 1 mg by mouth 2 (two) times daily.   tamsulosin 0.4 MG Caps capsule Commonly known as: FLOMAX Take 0.4 mg by mouth 2 (two) times daily.   trospium 20 MG tablet Commonly known as: SANCTURA Take 1 tablet (20 mg total) by mouth at bedtime. Started by: Zara Council, PA-C   Vitamin D3 50 MCG (2000 UT) capsule Take 2,000 Units by mouth daily.        Allergies:  Allergies  Allergen Reactions   Oxybutynin Other (See Comments)    Incomplete bladder emptying    Family History: Family History  Problem Relation Age of Onset   Drug abuse Sister    Drug abuse Brother     Social History:  reports that he has quit smoking. He  has never used smokeless tobacco. He reports that he does not drink alcohol and does not use drugs.  ROS: Pertinent ROS in  HPI  Physical Exam: BP (!) 142/58    Pulse 70    Ht '5\' 8"'  (1.727 m)    Wt 169 lb (76.7 kg)    BMI 25.70 kg/m   Constitutional:  Well nourished. Alert and oriented, No acute distress. HEENT: [place] AT, mask in place.  Trachea midline Cardiovascular: No clubbing, cyanosis, or edema. Respiratory: Normal respiratory effort, no increased work of breathing. Neurologic: Grossly intact, no focal deficits, moving all 4 extremities. Psychiatric: Normal mood and affect.  Laboratory Data: Hemoglobin A1C 4.2 - 5.6 % 7.8 High    Average Blood Glucose (Calc) mg/dL 177   Resulting Agency  [facility] - LAB  Narrative Performed by [facility] - LAB Normal Range:    4.2 - 5.6%  Increased Risk:  5.7 - 6.4%  Diabetes:        >= 6.5%  Glycemic Control for adults with diabetes:  <7%   Specimen Collected: 07/23/21 09:03 Last Resulted: 07/23/21 11:49  Received From: [facility]  Result Received: 07/30/21 16:53   Cholesterol, Total 100 - 200 mg/dL 115   Triglyceride 35 - 199 mg/dL 49   HDL (High Density Lipoprotein) Cholesterol 29.0 - 71.0 mg/dL 36.9   LDL Calculated 0 - 130 mg/dL 68   VLDL Cholesterol mg/dL 10   Cholesterol/HDL Ratio  3.1   Resulting Agency  [facility] - LAB  Specimen Collected: 07/23/21 09:03 Last Resulted: 07/23/21 10:21  Received From: [facility]  Result Received: 07/30/21 16:53   Glucose 70 - 110 mg/dL 115 High    Sodium 136 - 145 mmol/L 139   Potassium 3.6 - 5.1 mmol/L 4.3   Chloride 97 - 109 mmol/L 101   Carbon Dioxide (CO2) 22.0 - 32.0 mmol/L 32.8 High    Calcium 8.7 - 10.3 mg/dL 9.1   Urea Nitrogen (BUN) 7 - 25 mg/dL 29 High    Creatinine 0.7 - 1.3 mg/dL 1.5 High    Glomerular Filtration Rate (eGFR), MDRD Estimate >60 mL/min/1.73sq m 47 Low    BUN/Crea Ratio 6.0 - 20.0 19.3   Anion Gap  w/K 6.0 - 16.0 9.5   Resulting Agency  [facility] - LAB  Specimen Collected: 07/23/21 09:03 Last Resulted: 07/23/21 10:21  Received From: [facility]  Result Received: 07/30/21 16:53   Component     Latest Ref Rng & Units 04/30/2021  Specific Gravity, UA     1.005 - 1.030 1.015  pH, UA     5.0 - 7.5 6.5  Color, UA     Yellow Yellow  Appearance Ur     Clear Clear  Leukocytes,UA     Negative Negative  Protein,UA     Negative/Trace Negative  Glucose, UA     Negative Trace (A)  Ketones, UA     Negative Negative  RBC, UA     Negative Trace (A)  Bilirubin, UA     Negative Negative  Urobilinogen, Ur     0.2 - 1.0 mg/dL 1.0  Nitrite, UA     Negative Negative  Microscopic Examination      See below:   Component     Latest Ref Rng & Units 04/30/2021  WBC, UA     0 - 5 /hpf 0-5  RBC     0 - 2 /hpf 0-2  Epithelial Cells (non renal)     0 - 10 /hpf None seen  Bacteria, UA     None seen/Few  None seen  I have reviewed the labs.   Pertinent Imaging:  09/09/21 10:57  Scan Result 1m     Assessment & Plan:    1. BPH with LUTS -PSA stable -UA benign -PVR < 300 cc -most bothersome symptoms are nocturia x4-5 -Continue tamsulosin 0.4 mg daily and finasteride 5 mg daily  2. Nocturia - I explained to the patient that nocturia is often multi-factorial and difficult to treat.  Sleeping disorders, heart conditions, peripheral vascular disease, diabetes, an enlarged prostate for men, an urethral stricture causing bladder outlet obstruction and/or certain medications can contribute to nocturia. - I have suggested that the patient avoid caffeine after noon and alcohol in the evening.  He or she may also benefit from fluid restrictions after 6:00 in the evening and voiding just prior to bedtime. - I have explained that research studies have showed that over 84% of patients with sleep apnea reported frequent nighttime urination.   With sleep apnea, oxygen  decreases, carbon dioxide increases, the blood become more acidic, the heart rate drops and blood vessels in the lung constrict.  The body is then alerted that something is very wrong. The sleeper must wake enough to reopen the airway. By this time, the heart is racing and experiences a false signal of fluid overload. The heart excretes a hormone-like protein that tells the body to get rid of sodium and water, resulting in nocturia. -  I also informed the patient that a recent study noted that decreasing sodium intake to 2.3 grams daily, if they don't have issues with hyponatremia, can also reduce the number of nightly voids - The patient may benefit from a discussion with his or her primary care physician to see if he or she has risk factors for sleep apnea or other sleep disturbances and obtaining a sleep study-he denies any sleep apnea -Finished the current Myrbetriq prescription that he has filled, then he will start trospium 20 mg at night   Return in about 2 months (around 11/07/2021) for IPSS and PVR.  These notes generated with voice recognition software. I apologize for typographical errors.  SZara Council PA-C  [facility] [address] [phone]

## 2021-09-09 ENCOUNTER — Encounter: Payer: Self-pay | Admitting: Urology

## 2021-09-09 ENCOUNTER — Other Ambulatory Visit: Payer: Self-pay

## 2021-09-09 ENCOUNTER — Ambulatory Visit: Payer: Medicare Other | Admitting: Urology

## 2021-09-09 VITALS — BP 142/58 | HR 70 | Ht 68.0 in | Wt 169.0 lb

## 2021-09-09 DIAGNOSIS — R351 Nocturia: Secondary | ICD-10-CM

## 2021-09-09 DIAGNOSIS — N401 Enlarged prostate with lower urinary tract symptoms: Secondary | ICD-10-CM | POA: Diagnosis not present

## 2021-09-09 DIAGNOSIS — N138 Other obstructive and reflux uropathy: Secondary | ICD-10-CM | POA: Diagnosis not present

## 2021-09-09 LAB — BLADDER SCAN AMB NON-IMAGING

## 2021-09-09 MED ORDER — TROSPIUM CHLORIDE 20 MG PO TABS: 20.0000 mg | ORAL_TABLET | Freq: Every day | ORAL | 3 refills | Status: DC

## 2021-09-09 NOTE — Patient Instructions (Signed)
Finished the Myrbetriq 25 mg medication that you just picked up.  When you finish that prescription, start the trospium 20 mg at night.

## 2021-09-09 NOTE — Unmapped (Addendum)
[facility] Specialty Medication Onboarding    Specialty Medication: Mycophenolate 250mg  capsule  Prior Authorization: Not Required   Financial Assistance: No - copay  <$25  Final Copay/Day Supply: $15.95 / 30 days    Insurance Restrictions: None     Notes to Pharmacist: N/A    The triage team has completed the benefits investigation and has determined that the patient is able to fill this medication at [facility]. Please contact the patient to complete the onboarding or follow up with the prescribing physician as needed.

## 2021-09-11 NOTE — Unmapped (Signed)
The following medication is onboarded in this note:  1. Mycophenolate (generic cellcept) $15.95 / 30 days    [facility]   Patient Onboarding/Medication Counseling    Steven Lynn is a 66 y.o. male with a kidney transplant who I am counseling today on continuation of therapy.  I am speaking to the patient.    Was a Nurse, learning disability used for this call? Yes, [id number]. Patient language is appropriate in WAM    Verified patient's date of birth / HIPAA.    Specialty medication(s) to be sent: Transplant: None- Patient states has a month supply on hand and would like a call back in 4 weeks      Non-specialty medications/supplies to be sent: none      Medications not needed at this time: none         The patient declined counseling on missed dose instructions, goals of therapy, side effects and monitoring parameters, warnings and precautions, drug/food interactions and storage, handling precautions, and disposal because they have taken the medication previously. The information in the declined sections below are for informational purposes only and was not discussed with patient.   Cellcept (mycopheonlate mofetil)    Medication & Administration     Dosage: Take 3 capsules (750mg  total) by mouth twice daily    Administration:   ??? Take by mouth with or without food.   ??? Taking with food can minimize GI side effects.   ??? Swallow capsules whole, do not crush or chew.  ??? Oral suspension should be shaken well prior to administration.  Do not mix with other medications and discard any unused portion 60 days after constitution.      Adherence/Missed dose instructions:  Take a missed dose as soon as you remember it . If it is close to the time of your next dose, skip the missed dose and resume your normal schedule.Never take 2 doses to try and catch up from a missed dose.    Goals of Therapy     Prevent organ rejection    Side Effects & Monitoring Parameters     ??? Feeling tired or weak  ??? Shakiness  ??? Trouble sleeping  ??? Diarrhea, abdominal pain, nausea, vomiting, constipation or decreased appetite  ??? Decreases in blood counts   ??? Back or joint pain  ??? Hypertension or hypotension  ??? High blood sugar  ??? Headache  ??? Skin rash    The following side effects should be reported to the provider:  ??? Reduced immune function - report signs of infection such as fever; chills; body aches; very bad sore throat; ear or sinus pain; cough; more sputum or change in color of sputum; pain with passing urine; wound that will not heal, etc.  Also at a slightly higher risk of some malignancies (mainly skin and blood cancers) due to this reduced immune function.  ??? Allergic reaction (rash, hives, swelling, shortness of breath)  ??? High blood sugar (confusion, feeling sleepy, more thirst, more hungry, passing urine more often, flushing, fast breathing, or breath that smells like fruit)  ??? Electrolyte issues (mood changes, confusion, muscle pain or weakness, a heartbeat that does not feel normal, seizures, not hungry, or very bad upset stomach or throwing up)  ??? High or low blood pressure (bad headache or dizziness, passing out, or change in eyesight)  ??? Kidney issues (unable to pass urine, change in how much urine is passed, blood in the urine, or a big weight gain)  ??? Skin (  oozing, heat, swelling, redness, or pain), UTI and other infections   ??? Chest pain or pressure  ??? Abnormal heartbeat  ??? Unexplained bleeding or bruising  ??? Abnormal burning, numbness, or tingling  ??? Muscle cramps,  ??? Yellowing of skin or eyes    Monitoring parameters  ??? Pregnancy   ??? CBC   ??? Renal and hepatic function    Contraindications, Warnings, & Precautions     ??? *This is a REMS drug and an FDA-approved patient medication guide will be printed with each dispensation  ??? Black Box Warning: Infections   ??? Black Box Warning: Lymphoproliferative disorders - risk of development of lymphoma and skin malignancy is increased  ??? Black Box Warning: Use during pregnancy is associated with increased risks of first trimester pregnancy loss and congenital malformations.   ??? Black Box Warning: Females of reproductive potential should use contraception during treatment and for 6 weeks after therapy is discontinued  ??? Is patient using an effective method of contraception? Not Applicable  ??? If yes, method of contraception: male, not applicable  ??? CNS depression  ??? New or reactivated viral infections  ??? Neutropenia  ??? Male patients and/or their male partners should use effective contraception during treatment of the male patient and for at least 3 months after last dose.  ??? Breastfeeding is not recommended during therapy and for 6 weeks after last dose    Drug/Food Interactions     ??? Medication list reviewed in Epic. The patient was instructed to inform the care team before taking any new medications or supplements. no interactions noted that clinic is not already monitoring.   ??? Separate doses of antacids and this medication  ??? Check with your doctor before getting any vaccinations    Storage, Handling Precautions, & Disposal     ??? Store at room temperature in a dry place  ??? This medication is considered hazardous. Wash hands after handling and store out of reach or others, including children and pets.        Current Medications (including OTC/herbals), Comorbidities and Allergies     Current Outpatient Medications   Medication Sig Dispense Refill   ??? amLODIPine (NORVASC) 10 MG tablet Take 1 tablet (10 mg total) by mouth daily. 90 tablet 3   ??? apixaban (ELIQUIS) 5 mg Tab Take 5 mg by mouth Two (2) times a day.     ??? ascorbic acid, vitamin C, (VITAMIN C) 500 MG tablet Take 500 mg by mouth daily.     ??? aspirin (ECOTRIN) 81 MG tablet TAKE ONE (1) TABLET BY MOUTH EVERY DAY 90 tablet 4   ??? atorvastatin (LIPITOR) 40 MG tablet TOME 1 TABLETA POR LA BOCA  DIARIAMENTE CON LA COMIDA  DE LA TARDE 90 tablet 4   ??? blood sugar diagnostic Strp ge glucose test strips to check bs 4 times per day. E 13.9 (Patient not taking: Reported on 10/30/2020) 120 strip 6   ??? carvedilol (COREG) 3.125 MG tablet Take 1 tablet (3.125 mg total) by mouth Two (2) times a day. (Patient taking differently: Take 6.25 mg by mouth Two (2) times a day. ) 180 tablet 3   ??? mycophenolate (CELLCEPT) 250 mg capsule Take 3 capsules (750 mg total) by mouth Two (2) times a day. 180 capsule 11   ??? chlorthalidone (HYGROTON) 25 MG tablet Take 12.5 mg by mouth every morning.     ??? cholecalciferol, vitamin D3, 2,000 unit cap Take 2 capsules (4,000 Units total) by mouth  daily. 30 capsule 11   ??? finasteride (PROSCAR) 5 mg tablet Take 1 tablet by mouth daily.     ??? flash glucose sensor (FREESTYLE LIBRE 14 DAY SENSOR) kit by Miscellaneous route.     ??? insulin glargine (LANTUS SOLOSTAR) 100 unit/mL (3 mL) injection pen Inject 0.24 mL (24 Units total) under the skin nightly. DM E11.9 (Patient not taking: Reported on 10/30/2020) 10 pen 1   ??? insulin lispro (HUMALOG KWIKPEN INSULIN) 100 unit/mL injection pen Inject 10 units at breakfast, 10 units at lunch, and SS scale units at dinner. Inject 0-12 units based on sliding scale for BG > 150. 5 pen 5   ??? lancets Misc Use to check blood sugars 4 times per day. (Patient not taking: Reported on 10/30/2020) 200 each 11   ??? linaGLIPtin (TRADJENTA) 5 mg Tab Take 0.5 tablets (2.5 mg total) by mouth daily. 90 tablet 3   ??? losartan (COZAAR) 50 MG tablet TOME 1 TABLETA POR LA BOCA  DIARIAMENTE 90 tablet 3   ??? omeprazole (PRILOSEC) 20 MG capsule Take 20 mg by mouth daily.     ??? OMNIPOD 5 PACK PODS      ??? pen needle, diabetic (ADVOCATE PEN NEEDLE) 33 gauge x 5/32 Ndle To use with insulin pen 4 times per day. E 13.9 (Patient not taking: Reported on 10/30/2020) 120 each 6   ??? pen needle, diabetic (PEN NEEDLE) 31 gauge x 1/4 Ndle E11.9; check blood glucose TID AC (Patient not taking: Reported on 10/30/2020) 100 each 5   ??? tacrolimus (PROGRAF) 1 MG capsule Take 1 mg by mouth Two (2) times a day.     ??? tamsulosin (FLOMAX) 0.4 mg capsule Take 0.8 mg by mouth daily.       No current facility-administered medications for this visit.       No Known Allergies    Patient Active Problem List   Diagnosis   ??? Coronary atherosclerosis of native coronary artery   ??? Type II diabetes mellitus (CMS-HCC)   ??? Essential hypertension   ??? Diabetes mellitus (CMS-HCC)   ??? Coronary artery disease   ??? Vitamin D deficiency   ??? Hypertension   ??? Hypercholesteremia   ??? Pulmonary scarring   ??? Kidney 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/10/2015   ??? Paroxysmal atrial fibrillation (CMS-HCC)   ??? Immunosuppression (CMS-HCC)   ??? Aftercare following organ transplant       Reviewed and up to date in Epic.    Appropriateness of Therapy     Acute infections noted within Epic:  No active infections  Patient reported infection: None    Is medication and dose appropriate based on diagnosis and infection status? Yes    Prescription has been clinically reviewed: Yes      Baseline Quality of Life Assessment      How many days over the past month did your transplant  keep you from your normal activities? For example, brushing your teeth or getting up in the morning. 0    Financial Information     Medication Assistance provided: None Required    Anticipated copay of $15.95 / 30 days reviewed with patient. Verified delivery address.    Delivery Information     Scheduled delivery date: Patient has a month supply at home and does not need any today.    Expected start date: patient currently already taking brand cellcept from [employer]    Medication will be delivered via UPS to the prescription address in [facility].  This shipment will  not require a signature.      Explained the services we provide at [facility] and that each month we would call to set up refills.  Stressed importance of returning phone calls so that we could ensure they receive their medications in time each month.  Informed patient that we should be setting up refills 7-10 days prior to when they will run out of medication. A pharmacist will reach out to perform a clinical assessment periodically.  Informed patient that a welcome packet, containing information about our pharmacy and other support services, a Notice of Privacy Practices, and a drug information handout will be sent.      The patient or caregiver noted above participated in the development of this care plan and knows that they can request review of or adjustments to the care plan at any time.      Patient or caregiver verbalized understanding of the above information as well as how to contact the pharmacy at [phone] option 4 with any questions/concerns.  The pharmacy is open Monday through Friday 8:30am-4:30pm.  A pharmacist is available 24/7 via pager to answer any clinical questions they may have.    Patient Specific Needs     - Does the patient have any physical, cognitive, or cultural barriers? No    - Does the patient have adequate living arrangements? (i.e. the ability to store and take their medication appropriately) Yes    - Did you identify any home environmental safety or security hazards? No    - Patient prefers to have medications discussed with  Patient     - Is the patient or caregiver able to read and understand education materials at a high school level or above? Yes    - Patient's primary language is  Spanish     - Is the patient high risk? Yes, patient is taking a REMS drug. Medication is dispensed in compliance with REMS program        Thad Ranger  [facility] Specialty Pharmacist

## 2021-10-08 ENCOUNTER — Ambulatory Visit: Admit: 2021-10-08 | Discharge: 2021-10-08 | Payer: MEDICARE | Attending: Nephrology | Primary: Nephrology

## 2021-10-08 ENCOUNTER — Ambulatory Visit: Admit: 2021-10-08 | Discharge: 2021-10-08 | Payer: MEDICARE

## 2021-10-08 DIAGNOSIS — Z79899 Other long term (current) drug therapy: Principal | ICD-10-CM

## 2021-10-08 DIAGNOSIS — Z940 Kidney transplant status: Principal | ICD-10-CM

## 2021-10-08 LAB — URINALYSIS
BILIRUBIN UA: NEGATIVE
BLOOD UA: NEGATIVE
GLUCOSE UA: NEGATIVE
KETONES UA: NEGATIVE
LEUKOCYTE ESTERASE UA: NEGATIVE
NITRITE UA: NEGATIVE
PH UA: 6.5 (ref 5.0–9.0)
PROTEIN UA: NEGATIVE
RBC UA: <1 /HPF (ref <3)
SPECIFIC GRAVITY UA: 1.02 (ref 1.005–1.030)
SQUAMOUS EPITHELIAL: <1 /HPF (ref 0–5)
UROBILINOGEN UA: 1
WBC UA: 3 /HPF — ABNORMAL HIGH (ref <2)

## 2021-10-08 LAB — COMPREHENSIVE METABOLIC PANEL
ALBUMIN: 4.1 g/dL (ref 3.4–5.0)
ALKALINE PHOSPHATASE: 48 U/L (ref 46–116)
ALT (SGPT): 14 U/L (ref 10–49)
ANION GAP: 9 mmol/L (ref 5–14)
AST (SGOT): 22 U/L (ref <=34)
BILIRUBIN TOTAL: 0.9 mg/dL (ref 0.3–1.2)
BLOOD UREA NITROGEN: 25 mg/dL — ABNORMAL HIGH (ref 9–23)
BUN / CREAT RATIO: 20
CALCIUM: 9.6 mg/dL (ref 8.7–10.4)
CHLORIDE: 104 mmol/L (ref 98–107)
CO2: 23.9 mmol/L (ref 20.0–31.0)
CREATININE: 1.26 mg/dL — ABNORMAL HIGH
EGFR CKD-EPI (2021) MALE: 63 mL/min/{1.73_m2} (ref >=60)
GLUCOSE RANDOM: 157 mg/dL — ABNORMAL HIGH (ref 70–99)
POTASSIUM: 4.5 mmol/L (ref 3.4–4.8)
PROTEIN TOTAL: 7.4 g/dL (ref 5.7–8.2)
SODIUM: 137 mmol/L (ref 135–145)

## 2021-10-08 LAB — LIPID PANEL
CHOLESTEROL/HDL RATIO SCREEN: 2.6 (ref 1.0–4.5)
CHOLESTEROL: 92 mg/dL (ref <=200)
HDL CHOLESTEROL: 35 mg/dL — ABNORMAL LOW (ref 40–60)
LDL CHOLESTEROL CALCULATED: 42 mg/dL (ref 40–99)
NON-HDL CHOLESTEROL: 57 mg/dL — ABNORMAL LOW (ref 70–130)
TRIGLYCERIDES: 77 mg/dL (ref 0–150)
VLDL CHOLESTEROL CAL: 15.4 mg/dL (ref 12–42)

## 2021-10-08 LAB — TACROLIMUS LEVEL, TROUGH: TACROLIMUS, TROUGH: 8 ng/mL (ref 5.0–15.0)

## 2021-10-08 LAB — CBC W/ AUTO DIFF
BASOPHILS ABSOLUTE COUNT: 0 10*9/L (ref 0.0–0.1)
BASOPHILS RELATIVE PERCENT: 0.7 %
EOSINOPHILS ABSOLUTE COUNT: 0.1 10*9/L (ref 0.0–0.5)
EOSINOPHILS RELATIVE PERCENT: 1.4 %
HEMATOCRIT: 44.3 % (ref 39.0–48.0)
HEMOGLOBIN: 15 g/dL (ref 12.9–16.5)
LYMPHOCYTES ABSOLUTE COUNT: 1.3 10*9/L (ref 1.1–3.6)
LYMPHOCYTES RELATIVE PERCENT: 18.7 %
MEAN CORPUSCULAR HEMOGLOBIN CONC: 33.8 g/dL (ref 32.0–36.0)
MEAN CORPUSCULAR HEMOGLOBIN: 29.9 pg (ref 25.9–32.4)
MEAN CORPUSCULAR VOLUME: 88.7 fL (ref 77.6–95.7)
MEAN PLATELET VOLUME: 7.7 fL (ref 6.8–10.7)
MONOCYTES ABSOLUTE COUNT: 0.7 10*9/L (ref 0.3–0.8)
MONOCYTES RELATIVE PERCENT: 10.4 %
NEUTROPHILS ABSOLUTE COUNT: 4.9 10*9/L (ref 1.8–7.8)
NEUTROPHILS RELATIVE PERCENT: 68.8 %
PLATELET COUNT: 165 10*9/L (ref 150–450)
RED BLOOD CELL COUNT: 5 10*12/L (ref 4.26–5.60)
RED CELL DISTRIBUTION WIDTH: 13.6 % (ref 12.2–15.2)
WBC ADJUSTED: 7 10*9/L (ref 3.6–11.2)

## 2021-10-08 LAB — PROTEIN / CREATININE RATIO, URINE
CREATININE, URINE: 171.7 mg/dL
PROTEIN URINE: 18.2 mg/dL
PROTEIN/CREAT RATIO, URINE: 0.106

## 2021-10-08 LAB — MAGNESIUM: MAGNESIUM: 1.6 mg/dL (ref 1.6–2.6)

## 2021-10-08 LAB — PHOSPHORUS: PHOSPHORUS: 4.2 mg/dL (ref 2.4–5.1)

## 2021-10-08 NOTE — Unmapped (Signed)
Administered Covid Vaccine Moderna Bivalent as ordered onto the right deltoid. Patient tolerated well.

## 2021-10-08 NOTE — Unmapped (Signed)
AOBP: R arm M  cuff   Average:138/69  Pulse:81  1st reading: 121/67 Pulse:81  2nd reading: 147/72 Pulse:76  3rd reading: 147/69 Pulse:85

## 2021-10-08 NOTE — Unmapped (Signed)
[facility]   ACUTE/CHRONIC TRANSPLANT FOLLOW UP     PCP: No PCP Per Patient     Date of Visit at Transplant clinic: 10/08/2021     Graft Status: stable.    Assessment/Recommendations: [name] 66 y.o. male s/p kidney 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/10/15.    1) s/p Kidney txp - 05/10/2015 secondary to DM/HTN     Creatinine - level 1.26 mg/dL, within baseline fluctuating between 1.0 -1.4. Will continue to monitor Cr.   Urine analysis: negative protein/ 3 WBC/ <1 RBC    Urine protein/creatinine ratio: 0.106  DSA: negative - 11/05/20    Last CMV checked: not detected - 04/02/21  Last BKV checked: not detected - 10/08/21    2) Immunosuppression: Prograf 1 mg BID / Cellcept 750 mg BID.  - Last Prograf level 8.0 on 10/08/21. Last dose of Prograf: 9:00 p.m.  - Goal of 12 hour Prograf trough: 4-6  - Changes to immunosuppression: None today, as last prograf was ok.  - Medications side effects: No     3) HTN - 138/69 in clinic today - pt reports good control at home in 120 systolic.  Goal for B.P - <130/80  - Changes in B.P. Medications: none today.    4) Diabetes Mellitus - BGs elevated since cardiac procedure in 09/2019  - A1C 6.7  - Managed by Endocrinologist now.    5) Immunizations:  COVID-19 (Moderna): 08/23/19, 09/25/19 - 2 vaccines.  Influenza (inactivated only): Had it last year.  Pneumococcal vaccination (inactivated only): 05/2016 in [facility].   Shingrix: first dose on 02/15/17.     6) Cancer screening:  Colonoscopy - 04/2018 - Normal, repeat in 5 years (04/2023)  PSA: 0.5 (09/2014)    7) Paroxysmal Atrial fib:  Follows with cardiology. On Eliquis 5 mg BID.       Follow up: 6 months. Continue monthly labs.       History of Presenting Illness:   Since patient's last visit in the transplant clinic - patient has been doing well in terms of transplant, taking transplant medications regularly, no episodes of rejection and no side effects of medications.    Patient was admitted to [facility] from 04/27/20-04/29/20 for fevers, chills, and dysuria without hematuria or flank pain. UA was consistent with UTI. Met sepsis criteria. Renal CT findings show possible cystitis and prostatitis. No other hospital admissions.    He was started on trospium by urologist for his prostate along with finasteride. B.P continues to be good at home. No recent hospitalization or use of antibiotics. No fevers, chills, nausea, or vomiting.   BG is controlled now.     Last dose of Prograf at 9 p.m last night.   Diabetes: Yes    Duration: since 1980.    Insulin Dependent: Yes     Controlled: Yes     Endocrinologist: sees PCP.    HTN: Yes, 10 years.    Controlled:Yes   CAD/Heartfailure:No     Last Echo/Ejection fraction: 08/2014 - 45%    Stress test - 08/29/14 - fixed defect consistent with scar.    Adherence      With Medication: yes    With Follow up: yes     Functional Status: Independent - walks with the help of cane.    Review of Systems:     Fever or chills: negative   Sore throat: negative   Fatigue/malaise: negative   Weight loss or gain: negative   New skin rash/lump or bump:  negative   Problems with teeth/gums: negative   Chest pain: negative   Cough or shortness of breath: negative   Swelling: negative   Abdominal pain/heartburn/nausea/vomiting or diarrhea: negative   Pain or bleeding when urinating: negative   Twitching/numbness or weakness: negative     Physical Exam:   BP 138/69 (BP Site: R Arm, BP Position: Sitting, BP Cuff Size: Medium)  - Pulse 81  - Temp 36.1 ??C (96.9 ??F) (Temporal)  - Ht 172.7 cm (5' 8)  - Wt 77.1 kg (170 lb)  - BMI 25.85 kg/m??     Nursing note and vitals reviewed, walks with the help of cane.  Constitutional: Oriented to person, place, and time. Appears well-developed and well-nourished. No distress.   HENT: Wearing mask   Head: Normocephalic and atraumatic.   Eyes: Right eye exhibits no discharge. Left eye exhibits no discharge. No scleral icterus.   Neck: Normal range of motion. Neck supple.   Cardiovascular: Normal rate and regular rhythm. Exam reveals no gallop and no friction rub. No murmur heard.   Pulmonary/Chest: Effort normal and breath sounds normal. No respiratory distress.   Abdominal: Soft. Non tender  Musculoskeletal: Normal range of motion. No edema and no tenderness.   Neurological: Alert and oriented to person, place, and time.   Skin: Skin is warm and dry.  Psychiatric: Normal mood and affect. Behavior is normal. Judgment and thought content normal.       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   Type of transplant: KDPI - 37%    - Donor creatinine: 0.8    - Infection in donor:     Final donor culture results were received on 05/14/2015.     Blood culture(s) - no growth for 5 day(s)    Urine culture (s) - no growth for 2 day(s)     Respiratory culture(s) - positive for Strep pneumoniae and S.Aureus         - Ischemia time: 22 hours 13 minutes.    - Crossmatch: negative.    - Donor kidney biopsy: : Kidney, zero hour implantation biopsy:     - No pathologic abnormality identified      CMV: D-/R+   EBV: D+/R+   Induction: Campath   Maintenance IS at the time of transplant: Tacrolimus/Myfortic.   DGF: Yes     Allergies:   Allergies   Allergen Reactions   ??? Oxybutynin Other (See Comments)     Incomplete bladder emptying        Current Medications:   Current Outpatient Medications   Medication Sig Dispense Refill   ??? apixaban (ELIQUIS) 5 mg Tab Take 1 tablet (5 mg total) by mouth Two (2) times a day.     ??? ascorbic acid, vitamin C, (VITAMIN C) 500 MG tablet Take 1 tablet (500 mg total) by mouth daily.     ??? aspirin (ECOTRIN) 81 MG tablet TAKE ONE (1) TABLET BY MOUTH EVERY DAY 90 tablet 4   ??? atorvastatin (LIPITOR) 40 MG tablet TOME 1 TABLETA POR LA BOCA  DIARIAMENTE CON LA COMIDA  DE LA TARDE 90 tablet 4   ??? blood sugar diagnostic Strp ge glucose test strips to check bs 4 times per day. E 13.9 120 strip 6   ??? chlorthalidone (HYGROTON) 25 MG tablet  Take 0.5 tablets (12.5 mg total) by mouth every morning.     ??? finasteride (PROSCAR) 5 mg tablet Take 1 tablet (5 mg total) by mouth daily.     ??? flash glucose sensor (FREESTYLE LIBRE 14 DAY SENSOR) kit by Miscellaneous route.     ??? insulin glargine (LANTUS SOLOSTAR) 100 unit/mL (3 mL) injection pen Inject 0.24 mL (24 Units total) under the skin nightly. DM E11.9 10 pen 1   ??? insulin lispro (HUMALOG KWIKPEN INSULIN) 100 unit/mL injection pen Inject 10 units at breakfast, 10 units at lunch, and SS scale units at dinner. Inject 0-12 units based on sliding scale for BG > 150. 5 pen 5   ??? lancets Misc Use to check blood sugars 4 times per day. 200 each 11   ??? linaGLIPtin (TRADJENTA) 5 mg Tab Take 0.5 tablets (2.5 mg total) by mouth daily. 90 tablet 3   ??? losartan (COZAAR) 50 MG tablet TOME 1 TABLETA POR LA BOCA  DIARIAMENTE 90 tablet 3   ??? mycophenolate (CELLCEPT) 250 mg capsule Take 3 capsules (750 mg total) by mouth Two (2) times a day. 180 capsule 11   ??? omeprazole (PRILOSEC) 20 MG capsule Take 1 capsule (20 mg total) by mouth daily.     ??? OMNIPOD 5 PACK PODS      ??? pen needle, diabetic (ADVOCATE PEN NEEDLE) 33 gauge x 5/32 Ndle To use with insulin pen 4 times per day. E 13.9 120 each 6   ??? pen needle, diabetic (PEN NEEDLE) 31 gauge x 1/4 Ndle E11.9; check blood glucose TID AC 100 each 5   ??? tacrolimus (PROGRAF) 1 MG capsule Take 1 capsule (1 mg total) by mouth Two (2) times a day.     ??? tamsulosin (FLOMAX) 0.4 mg capsule Take 2 capsules (0.8 mg total) by mouth daily.     ??? amLODIPine (NORVASC) 10 MG tablet Take 1 tablet (10 mg total) by mouth daily. 90 tablet 3   ??? carvedilol (COREG) 3.125 MG tablet Take 1 tablet (3.125 mg total) by mouth Two (2) times a day. (Patient taking differently: Take 6.25 mg by mouth Two (2) times a day. ) 180 tablet 3   ??? cholecalciferol, vitamin D3, 2,000 unit cap Take 2 capsules (4,000 Units total) by mouth daily. 30 capsule 11     No current facility-administered medications for this visit.       Past Medical History:   Past Medical History:   Diagnosis Date   ??? Aspiration pneumonia (CMS-HCC) 2011    while comatose   ??? Atrial fibrillation (CMS-HCC)    ??? Coronary artery disease 2004    open heart surgery 06/30/2006 2-V bypass; MI 2004 2 stents, MI 2005 1 stent; 1 stent 1st diagonal 08/25/2007   ??? Diabetes mellitus (CMS-HCC)     on insulin   ??? Diabetic nephropathy (CMS-HCC)    ??? End-stage renal disease (ESRD) (CMS-HCC)    ??? Hypercholesteremia    ??? Hypertension    ??? Immunosuppression (CMS-HCC) 11/11/2015   ??? Kidney 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/10/2015 05/12/2015   ??? Myocardial infarct, old    ??? Pulmonary scarring 2011    from prior CT placement   ??? 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 Vitamin D deficiency         Laboratory studies:     No results found for this or any previous visit (from the past 170 hour(s)).   Leeroy Bock, MD

## 2021-10-09 LAB — BK VIRUS QUANTITATIVE PCR, BLOOD: BK BLOOD RESULT: NOT DETECTED

## 2021-10-12 LAB — EBV QUANTITATIVE PCR, BLOOD: EBV VIRAL LOAD RESULT: NOT DETECTED

## 2021-10-13 LAB — CMV DNA, QUANTITATIVE, PCR: CMV VIRAL LD: NOT DETECTED

## 2021-10-13 LAB — VITAMIN D 1,25 DIHYDROXY: VITAMIN D 1,25-DIHYDROXY: 25 pg/mL

## 2021-10-13 NOTE — Unmapped (Signed)
Colonoscopy  Procedure #1      0  Procedure #2      [id number]  MRN      GENERIC  Endoscopist      TRUE  Is the patient's health insurance [employer], [facility]), or [employer]age?      FALSE  Urgent procedure      TRUE  Do you take: Plavix (clopidogrel), Coumadin (warfarin), Lovenox (enoxaparin), Pradaxa (dabigatran), Effient (prasugrel), Xarelto (rivaroxaban), Eliquis (apixaban), Pletal (cilostazol), or Brilinta (ticagrelor)?    FALSE  Do you have hemophilia, von Willebrand disease, thrombocytopenia?      FALSE  Do you have a pacemaker or implanted cardiac defibrillator?      FALSE  Are you pregnant?      FALSE  Has a [place] [facility] provider specified the location(s)?        Which location(s) did the [facility] provider specify?      FALSE     [facility]      FALSE     [place]      FALSE     [facility]-Propofol      FALSE     HMOB-Mod Sedation      FALSE  Is procedure indication for variceal banding (this does NOT include variceal screening)?              5  Height (feet)      8  Height (inches)      169  Weight (pounds)      25.7  BMI              FALSE  Did the ordering provider specify a bowel prep?      NO       What bowel prep was specified?      FALSE  Do you have chronic kidney disease?      FALSE  Do you have chronic constipation or have you had poor quality bowel preps for past colonoscopies?      FALSE  Do you have Crohn's disease or ulcerative colitis?      FALSE  Have you had weight loss surgery?              FALSE  Are you in the process of scheduling or awaiting results of a heart ultrasound, stress test, or catheterization to evaluate new or worsening chest pain, dizziness, or shortness of breath?     TRUE  When you walk around your house or grocery store, do you have to stop and rest due to shortness of breath, chest pain, or light-headedness?      FALSE  Have you had a heart attack, stroke or heart stent placement within the past 6 months?      FALSE  Do you ever use supplemental oxygen?      FALSE  Have you been hospitalized for cirrhosis of the liver or heart failure in the last 12 months?      FALSE  Have you been treated for mouth or throat cancer with radiation or surgery?      FALSE  Have you been told that it is difficult for doctors to insert a breathing tube in you during anesthesia?      FALSE  Have you had a heart or lung transplant?              FALSE  Are you on dialysis?      FALSE  Do you have cirrhosis of the liver?  FALSE  Do you have myasthenia gravis?      FALSE  Is the patient a prisoner?              FALSE  Have you been diagnosed with sleep apnea or do you wear a CPAP machine at night?      FALSE  Are you younger than 30?      FALSE  Have you previously received propofol sedation administered by an anesthesiologist for a GI procedure?      FALSE  Do you drink an average of more than 3 drinks of alcohol per day?      FALSE  Do you regularly take suboxone or any prescription medications for chronic pain?      FALSE  Do you regularly take Ativan, Klonopin, Xanax, Valium, lorazepam, clonazepam, alprazolam, or diazepam?      FALSE  Have you previously had difficulty with sedation during a GI procedure?      FALSE  Have you been diagnosed with PTSD?      FALSE  Are you allergic to fentanyl or midazolam (Versed)?      FALSE  Do you take medications for HIV?      ################### ################################################################################################################### ################ ############### ###############   MRN:          [id number]      Anticoag Review:  Yes      Nurse Triage:  No      GI Clinic Consult:  No      Procedure(s):  Colonoscopy 0     [facility]      Endoscopist:  GENERIC      Urgent:            No       Prep:               Nulytely              ################### ################################################################################################################### ################ ############### ###############

## 2021-10-13 NOTE — Unmapped (Signed)
[facility] Clinical Assessment & Refill Coordination Note    Steven Lynn, DOB: 29-Jun-1956  Phone: [phone] (home)      All above HIPAA information was verified with patient.     Was a Nurse, learning disability used for this call? No    Specialty Medication(s):   Transplant: mycophenolate mofetil 250mg      Current Outpatient Medications   Medication Sig Dispense Refill   ??? amLODIPine (NORVASC) 10 MG tablet Take 1 tablet (10 mg total) by mouth daily. 90 tablet 3   ??? apixaban (ELIQUIS) 5 mg Tab Take 1 tablet (5 mg total) by mouth Two (2) times a day.     ??? ascorbic acid, vitamin C, (VITAMIN C) 500 MG tablet Take 1 tablet (500 mg total) by mouth daily.     ??? aspirin (ECOTRIN) 81 MG tablet TAKE ONE (1) TABLET BY MOUTH EVERY DAY 90 tablet 4   ??? atorvastatin (LIPITOR) 40 MG tablet TOME 1 TABLETA POR LA BOCA  DIARIAMENTE CON LA COMIDA  DE LA TARDE 90 tablet 4   ??? blood sugar diagnostic Strp ge glucose test strips to check bs 4 times per day. E 13.9 120 strip 6   ??? carvedilol (COREG) 3.125 MG tablet Take 1 tablet (3.125 mg total) by mouth Two (2) times a day. (Patient taking differently: Take 2 tablets (6.25 mg total) by mouth Two (2) times a day.) 180 tablet 3   ??? chlorthalidone (HYGROTON) 25 MG tablet Take 0.5 tablets (12.5 mg total) by mouth every morning.     ??? cholecalciferol, vitamin D3, 2,000 unit cap Take 2 capsules (4,000 Units total) by mouth daily. 30 capsule 11   ??? finasteride (PROSCAR) 5 mg tablet Take 1 tablet (5 mg total) by mouth daily.     ??? flash glucose sensor (FREESTYLE LIBRE 14 DAY SENSOR) kit by Miscellaneous route.     ??? insulin glargine (LANTUS SOLOSTAR) 100 unit/mL (3 mL) injection pen Inject 0.24 mL (24 Units total) under the skin nightly. DM E11.9 10 pen 1   ??? insulin lispro (HUMALOG KWIKPEN INSULIN) 100 unit/mL injection pen Inject 10 units at breakfast, 10 units at lunch, and SS scale units at dinner. Inject 0-12 units based on sliding scale for BG > 150. 5 pen 5   ??? lancets Misc Use to check blood sugars 4 times per day. 200 each 11   ??? linaGLIPtin (TRADJENTA) 5 mg Tab Take 0.5 tablets (2.5 mg total) by mouth daily. 90 tablet 3   ??? losartan (COZAAR) 50 MG tablet TOME 1 TABLETA POR LA BOCA  DIARIAMENTE 90 tablet 3   ??? mycophenolate (CELLCEPT) 250 mg capsule Take 3 capsules (750 mg total) by mouth Two (2) times a day. 180 capsule 11   ??? omeprazole (PRILOSEC) 20 MG capsule Take 1 capsule (20 mg total) by mouth daily.     ??? OMNIPOD 5 PACK PODS      ??? pen needle, diabetic (ADVOCATE PEN NEEDLE) 33 gauge x 5/32 Ndle To use with insulin pen 4 times per day. E 13.9 120 each 6   ??? pen needle, diabetic (PEN NEEDLE) 31 gauge x 1/4 Ndle E11.9; check blood glucose TID AC 100 each 5   ??? tacrolimus (PROGRAF) 1 MG capsule Take 1 capsule (1 mg total) by mouth Two (2) times a day.     ??? tamsulosin (FLOMAX) 0.4 mg capsule Take 2 capsules (0.8 mg total) by mouth daily.     ??? trospium (SANCTURA) 20  mg tablet Take 1 tablet (20 mg total) by mouth Two (2) times a day.       No current facility-administered medications for this visit.        Changes to medications: Steven Lynn reports no changes at this time.    Allergies   Allergen Reactions   ??? Oxybutynin Other (See Comments)     Incomplete bladder emptying       Changes to allergies: No    SPECIALTY MEDICATION ADHERENCE     Mycophenolate 250 mg: 30 days of medicine on hand       Medication Adherence    Patient reported X missed doses in the last month: 0  Specialty Medication: Mycophenolate 250mg   Patient is on additional specialty medications: No          Specialty medication(s) dose(s) confirmed: Regimen is correct and unchanged.     Are there any concerns with adherence? No    Adherence counseling provided? Not needed    CLINICAL MANAGEMENT AND INTERVENTION      Clinical Benefit Assessment:    Do you feel the medicine is effective or helping your condition? Yes    Clinical Benefit counseling provided? Not needed    Adverse Effects Assessment:    Are you experiencing any side effects? No    Are you experiencing difficulty administering your medicine? No    Quality of Life Assessment:         How many days over the past month did your kidney transplant  keep you from your normal activities? For example, brushing your teeth or getting up in the morning. 0    Have you discussed this with your provider? Not needed    Acute Infection Status:    Acute infections noted within Epic:  No active infections  Patient reported infection: None    Therapy Appropriateness:    Is therapy appropriate and patient progressing towards therapeutic goals? Yes, therapy is appropriate and should be continued    DISEASE/MEDICATION-SPECIFIC INFORMATION      N/A    PATIENT SPECIFIC NEEDS     - Does the patient have any physical, cognitive, or cultural barriers? No    - Is the patient high risk? Yes, patient is taking a REMS drug. Medication is dispensed in compliance with REMS program    Does the patient require a Care Management Plan? No     SHIPPING     Specialty Medication(s) to be Shipped:   Transplant: None- patient has 30 days on hand.     Other medication(s) to be shipped: No additional medications requested for fill at this time     Changes to insurance: No    Delivery Scheduled: Patient declined refill at this time due to has 30 days on hand..     Medication will be delivered via UPS to the confirmed prescription address in [facility].    The patient will receive a drug information handout for each medication shipped and additional FDA Medication Guides as required.  Verified that patient has previously received a Conservation officer, historic buildings and a Surveyor, mining.    The patient or caregiver noted above participated in the development of this care plan and knows that they can request review of or adjustments to the care plan at any time.      All of the patient's questions and concerns have been addressed.    Tera Helper   [facility] Specialty Pharmacist

## 2021-10-18 LAB — HLA DS POST TRANSPLANT
ANTI-DONOR DRW #1 MFI: 2 MFI
ANTI-DONOR DRW #2 MFI: 224 MFI
ANTI-DONOR HLA-A #1 MFI: 0 MFI
ANTI-DONOR HLA-A #2 MFI: 0 MFI
ANTI-DONOR HLA-B #1 MFI: 0 MFI
ANTI-DONOR HLA-B #2 MFI: 0 MFI
ANTI-DONOR HLA-C #2 MFI: 53 MFI
ANTI-DONOR HLA-DQB #1 MFI: 0 MFI
ANTI-DONOR HLA-DQB #2 MFI: 97 MFI
ANTI-DONOR HLA-DR #1 MFI: 241 MFI
ANTI-DONOR HLA-DR #2 MFI: 0 MFI

## 2021-10-18 LAB — FSAB CLASS 2 ANTIBODY SPECIFICITY
CLASS 2 ANTIBODIES IDENTIFIED: 1:05 {titer}
HLA CL2 AB RESULT: POSITIVE

## 2021-10-18 LAB — FSAB CLASS 1 ANTIBODY SPECIFICITY: HLA CLASS 1 ANTIBODY RESULT: NEGATIVE

## 2021-11-09 NOTE — Progress Notes (Signed)
? ? ?11/10/2021 ?10:27 AM  ? ?[name] ?[birth year] ?[id number] ? ?Referring provider: Frazier Richards, MD ?[address] ?[place],  [place] [address] ? ?Chief Complaint  ?Patient presents with  ? Benign Prostatic Hypertrophy  ? ?Urological history: ?1. BPH with LU TS ?-PSA 0.2 in 03/2021 ?-prostate volume 43 gram TRUS 2022 ?-cysto 2022 NED ?-I PSS 13/2 ?-PVR 0 mL ?-managed with finasteride 5 mg daily and tamsulosin 0.4 mg daily  ? ?2. Nocturia ?-Risk factors for nocturia: hypertension, diabetes, heart and BPH ? ?HPI: ?[name] is a 66 y.o. male who presents today for one month follow up after changing his Myrbetriq 25 mg to trospium 20 mg qhs due to cost with his tamsulosin and finasteride 5 mg.  ? ?He states about the Myrbetriq and the trospium he was still getting up 4-5 times nightly.  He states he did have a sleep study, but I have been unable to find the results in the chart.  I tried to reach out to the sleep lab to see if I get notes from them, but had to leave message. ? ?Patient denies any modifying or aggravating factors.  Patient denies any gross hematuria, dysuria or suprapubic/flank pain.  Patient denies any fevers, chills, nausea or vomiting.   ? ?PVR 0 mL  ? ?He also mentions that he has been losing erections during intercourse.  He has not tried PDE 5 inhibitors and he is not interested in any medications for this at this time. ? ? IPSS   ? ? [name] 11/10/21 0900  ?  ?  ?  ? International Prostate Symptom Score  ? How often have you had the sensation of not emptying your bladder? Less than 1 in 5    ? How often have you had to urinate less than every two hours? Less than half the time    ? How often have you found you stopped and started again several times when you urinated? Not at All    ? How often have you found it difficult to postpone urination? Not at All    ? How often have you had a weak urinary stream? Almost always    ? How often have you had to strain to start  urination? Not at All    ? How many times did you typically get up at night to urinate? 5 Times    ? Total IPSS Score 13    ?  ? Quality of Life due to urinary symptoms  ? If you were to spend the rest of your life with your urinary condition just the way it is now how would you feel about that? Mostly Satisfied    ? ?  ?  ? ?  ? ? ? ?Score:  ?1-7 Mild ?8-19 Moderate ?20-35 Severe   ? ?PMH: ?Past Medical History:  ?Diagnosis Date  ? Arthritis   ? Chronic kidney disease   ? KIDNEY TRANSPLANT  ? Coronary artery disease   ? Diabetes mellitus without complication ([place])   ? Dyspnea   ? GERD (gastroesophageal reflux disease)   ? History of blood transfusion   ? Hypertension   ? Myocardial infarction [facility])   ? ? ?Surgical History: ?Past Surgical History:  ?Procedure Laterality Date  ? CATARACT EXTRACTION W/PHACO Right 02/03/2017  ? Procedure: CATARACT EXTRACTION PHACO AND INTRAOCULAR LENS PLACEMENT (IOC);  Surgeon: Leandrew Koyanagi, MD;  Location: [facility] ORS;  Service: Ophthalmology;  Laterality: Right;  [place] 00:35.8 ?AP%  12.8 ?CDE 4.57 ?Fluid lot # [id number]  ? CORONARY ANGIOPLASTY    ? STENTS X 4  ? CORONARY ARTERY BYPASS GRAFT    ? ENDARTERECTOMY Left 09/06/2019  ? Procedure: ENDARTERECTOMY CAROTID;  Surgeon: Algernon Huxley, MD;  Location: [facility] ORS;  Service: Vascular;  Laterality: Left;  ? EYE SURGERY Bilateral   ? cataract  ? HIP SURGERY    ? KIDNEY TRANSPLANT    ? 2016  ? ? ?Home Medications:  ?Allergies as of 11/10/2021   ? ?   Reactions  ? Oxybutynin Other (See Comments)  ? Incomplete bladder emptying  ? ?  ? ?  ?Medication List  ?  ? ?  ? Accurate as of November 10, 2021 10:27 AM. If you have any questions, ask your nurse or doctor.  ?  ?  ? ?  ? ?amLODipine 10 MG tablet ?Commonly known as: NORVASC ?Take 10 mg by mouth daily. ?  ?ascorbic acid 500 MG tablet ?Commonly known as: VITAMIN C ?Take 500 mg by mouth daily. ?  ?aspirin 81 MG EC tablet ?Take 1 tablet (81 mg total) by mouth daily at 6 (six) AM. ?  ?atorvastatin 40 MG  tablet ?Commonly known as: LIPITOR ?40 mg daily at 6 PM. ?  ?carvedilol 12.5 MG tablet ?Commonly known as: COREG ?Take 12.5 mg by mouth at bedtime. ?  ?CellCept 250 MG capsule ?Generic drug: mycophenolate ?Take 250 mg by mouth 2 (two) times daily. ?  ?chlorthalidone 25 MG tablet ?Commonly known as: HYGROTON ?Take 12.5 mg by mouth every morning. ?  ?Eliquis 5 MG Tabs tablet ?Generic drug: apixaban ?Take 5 mg by mouth 2 (two) times daily. ?  ?finasteride 5 MG tablet ?Commonly known as: PROSCAR ?Take 1 tablet (5 mg total) by mouth daily. ?  ?FreeStyle Libre 2 Sensor Misc ?by Does not apply route. ?  ?HumaLOG KwikPen 100 UNIT/ML KwikPen ?Generic drug: insulin lispro ?Inject 10-12 Units into the skin 3 (three) times daily. Inject 10u daily at breakfast, 10u at lunch and up to 12u at supper per SSI ?  ?Insulin Pen Needle 33G X 4 MM Misc ?To use with insulin pen 4 times per day. E 13.9 ?  ?Lantus SoloStar 100 UNIT/ML Solostar Pen ?Generic drug: insulin glargine ?Inject 24 Units into the skin at bedtime. ?  ?linagliptin 5 MG Tabs tablet ?Commonly known as: TRADJENTA ?Take 5 mg by mouth daily. ?  ?losartan 50 MG tablet ?Commonly known as: COZAAR ?Take 50 mg by mouth daily. ?  ?omeprazole 20 MG capsule ?Commonly known as: PRILOSEC ?Take 20 mg by mouth daily as needed. ?  ?Omnipod DASH Pods (Gen 4) Misc ?  ?Prograf 1 MG capsule ?Generic drug: tacrolimus ?Take 1 mg by mouth 2 (two) times daily. ?  ?tamsulosin 0.4 MG Caps capsule ?Commonly known as: FLOMAX ?Take 0.4 mg by mouth 2 (two) times daily. ?  ?trospium 20 MG tablet ?Commonly known as: SANCTURA ?Take 1 tablet (20 mg total) by mouth at bedtime. ?  ?Vitamin D3 50 MCG (2000 UT) capsule ?Take 2,000 Units by mouth daily. ?  ? ?  ? ? ?Allergies:  ?Allergies  ?Allergen Reactions  ? Oxybutynin Other (See Comments)  ?  Incomplete bladder emptying  ? ? ?Family History: ?Family History  ?Problem Relation Age of Onset  ? Drug abuse Sister   ? Drug abuse Brother   ? ? ?Social  History:  reports that he has quit smoking. He has never used smokeless tobacco. He reports that  he does not drink alcohol and does not use drugs. ? ?ROS: ?Pertinent ROS in HPI ? ?Physical Exam: ?BP 124/65   Pulse 76   Ht 5\' 8"  (1.727 m)   Wt 170 lb (77.1 kg)   BMI 25.85 kg/m?   ?Constitutional:  Well nourished. Alert and oriented, No acute distress. ?HEENT: [place] AT, moist mucus membranes.  Trachea midline ?Cardiovascular: No clubbing, cyanosis, or edema. ?Respiratory: Normal respiratory effort, no increased work of breathing. ?Neurologic: Grossly intact, no focal deficits, moving all 4 extremities. ?Psychiatric: Normal mood and affect.  ? ?Laboratory Data: ?N/A ? ? ?Pertinent Imaging: ? 11/10/21 09:39  ?Scan Result 1mL  ? ? ?Assessment & Plan:   ? ?1. BPH with LUTS ?-PSA stable ?-PVR < 300 cc ?-most bothersome symptoms are nocturia ?-continue conservative management, avoiding bladder irritants and timed voiding's ?-Continue tamsulosin 0.4 mg daily and finasteride 5 mg daily ? ?2. Nocturia ?-Minimal improvement with the Myrbetriq and the trospium, but he states he does not work so we can take naps during the day so its not bothersome ?-Patient states he had 2 sleep studies but he is never heard the results ?-I tried to contact the sleep lab, but had to leave a message ?-I also looked through epic to see if I could see test results or evidence of a sleep study and I did not find any ?-Encouraged him to speak with his primary care provider about going over those results or rescheduling sleep study if warranted ? ?3. ED ?-We will check a testosterone level at this time per AUA guidelines, the patient is not interested in taking any medications for this at this time ? ?Return for august 2023 for PSA, IPSS, SHIM, PVR and exam . ? ?These notes generated with voice recognition software. I apologize for typographical errors. ? ?Torri Michalski, PA-C ? ?[place] ?[place], [place] [address] ?[phone]  ?I spent 30 minutes on the day of the encounter to include pre-visit record review, face-to-face time with the patient, and post-visit ordering of tests.  ?

## 2021-11-10 ENCOUNTER — Ambulatory Visit: Payer: Medicare Other | Admitting: Urology

## 2021-11-10 ENCOUNTER — Encounter: Payer: Self-pay | Admitting: Urology

## 2021-11-10 VITALS — BP 124/65 | HR 76 | Ht 68.0 in | Wt 170.0 lb

## 2021-11-10 DIAGNOSIS — N528 Other male erectile dysfunction: Secondary | ICD-10-CM | POA: Diagnosis not present

## 2021-11-10 DIAGNOSIS — N401 Enlarged prostate with lower urinary tract symptoms: Secondary | ICD-10-CM

## 2021-11-10 DIAGNOSIS — R351 Nocturia: Secondary | ICD-10-CM | POA: Diagnosis not present

## 2021-11-10 DIAGNOSIS — N138 Other obstructive and reflux uropathy: Secondary | ICD-10-CM

## 2021-11-10 LAB — BLADDER SCAN AMB NON-IMAGING

## 2021-11-10 NOTE — Unmapped (Signed)
[facility] Refill Coordination Note    Specialty Medication(s) to be Shipped:   Transplant: mycophenolate mofetil 250mg     Other medication(s) to be shipped: No additional medications requested for fill at this time     Steven Lynn, DOB: February 23, 1956  Phone: [phone] (home)       All above HIPAA information was verified with patient.     Was a Nurse, learning disability used for this call? No    Completed refill call assessment today to schedule patient's medication shipment from the [facility] [phone]).  All relevant notes have been reviewed.     Specialty medication(s) and dose(s) confirmed: Regimen is correct and unchanged.   Changes to medications: Sherill reports no changes at this time.  Changes to insurance: No  New side effects reported not previously addressed with a pharmacist or physician: None reported  Questions for the pharmacist: No    Confirmed patient received a Conservation officer, historic buildings and a Surveyor, mining with first shipment. The patient will receive a drug information handout for each medication shipped and additional FDA Medication Guides as required.       DISEASE/MEDICATION-SPECIFIC INFORMATION        N/A    SPECIALTY MEDICATION ADHERENCE     Medication Adherence    Patient reported X missed doses in the last month: 0  Specialty Medication: Mycophenolate 250mg   Patient is on additional specialty medications: No        Were doses missed due to medication being on hold? No    Mycophenolate 250 mg: 5 days of medicine on hand     REFERRAL TO PHARMACIST     Referral to the pharmacist: Not needed      [facility]     Shipping address confirmed in Epic.     Delivery Scheduled: Yes, Expected medication delivery date: 11/13/2021.     Medication will be delivered via UPS to the prescription address in Epic WAM.    Lorelei Pont [facility] Specialty Technician

## 2021-11-11 LAB — TESTOSTERONE: Testosterone: 556 ng/dL (ref 264–916)

## 2021-11-12 ENCOUNTER — Other Ambulatory Visit: Payer: Self-pay | Admitting: Urology

## 2021-11-17 MED ORDER — PEG 3350-ELECTROLYTES 236 GRAM-22.74 GRAM-6.74 GRAM-5.86 GRAM SOLUTION
Freq: Once | ORAL | 0 refills | 1 days | Status: CP
Start: 2021-11-17 — End: 2021-11-18
  Filled 2021-11-17: qty 4000, 1d supply, fill #0

## 2021-11-20 NOTE — Unmapped (Signed)
Spoke with patient with Spanish interpreter. Advised patient that he will need to hold Eliquis for 3 days prior to procedure. Last dose prior to procedure will be Saturday 11/21/21.   GI provider will let him know when to restart. Patient states he was aware of this.     Confirmed patient has prep medication and instruction packet.   Patient wanted to know if he can continue to take antibiotics for an infection for the next several days.     Patient states he has no further questions, as he has done this process before.

## 2021-11-25 ENCOUNTER — Encounter: Admit: 2021-11-25 | Discharge: 2021-11-25 | Payer: MEDICARE

## 2021-11-25 ENCOUNTER — Ambulatory Visit: Admit: 2021-11-25 | Discharge: 2021-11-25 | Payer: MEDICARE

## 2021-11-25 MED ADMIN — propofoL (DIPRIVAN) injection: INTRAVENOUS | @ 15:00:00 | Stop: 2021-11-25

## 2021-11-25 MED ADMIN — lidocaine (XYLOCAINE) 20 mg/mL (2 %) injection: INTRAVENOUS | @ 15:00:00 | Stop: 2021-11-25

## 2021-11-25 MED ADMIN — dextrose 50 % in water (D50W) 50 % solution 10 mL: 10 mL | INTRAVENOUS | @ 14:00:00 | Stop: 2021-11-25

## 2021-11-25 MED ADMIN — sodium chloride (NS) 0.9 % infusion: 10 mL/h | INTRAVENOUS | @ 14:00:00 | Stop: 2021-11-25

## 2021-12-09 ENCOUNTER — Other Ambulatory Visit: Payer: Self-pay

## 2021-12-09 DIAGNOSIS — Z1211 Encounter for screening for malignant neoplasm of colon: Secondary | ICD-10-CM

## 2021-12-09 MED ORDER — NA SULFATE-K SULFATE-MG SULF 17.5-3.13-1.6 GM/177ML PO SOLN: 1.0000 | Freq: Once | ORAL | 0 refills | Status: AC

## 2021-12-09 NOTE — Progress Notes (Signed)
Gastroenterology Pre-Procedure Review ? ?Request Date: 12/30/2021 ?Requesting Physician: Dr. Vicente Males ? ?PATIENT REVIEW QUESTIONS: The patient responded to the following health history questions as indicated:   ? ?1. Are you having any GI issues? no ?2. Do you have a personal history of Polyps? no ?3. Do you have a family history of Colon Cancer or Polyps? no ?4. Diabetes Mellitus? yes (type2) ?5. Joint replacements in the past 12 months?no ?6. Major health problems in the past 3 months?no ?7. Any artificial heart valves, MVP, or defibrillator?no ?   ?MEDICATIONS & ALLERGIES:    ?Patient reports the following regarding taking any anticoagulation/antiplatelet therapy:   ?Plavix, Coumadin, Eliquis, Xarelto, Lovenox, Pradaxa, Brilinta, or Effient? Yes eliquis  ?Aspirin? yes (81 mg) ? ?Patient confirms/reports the following medications:  ?Current Outpatient Medications  ?Medication Sig Dispense Refill  ? amLODipine (NORVASC) 10 MG tablet Take 10 mg by mouth daily.     ? ascorbic acid (VITAMIN C) 500 MG tablet Take 500 mg by mouth daily.    ? aspirin EC 81 MG EC tablet Take 1 tablet (81 mg total) by mouth daily at 6 (six) AM.    ? atorvastatin (LIPITOR) 40 MG tablet 40 mg daily at 6 PM.     ? carvedilol (COREG) 12.5 MG tablet Take 12.5 mg by mouth at bedtime.    ? CELLCEPT 250 MG capsule Take 250 mg by mouth 2 (two) times daily.    ? chlorthalidone (HYGROTON) 25 MG tablet Take 12.5 mg by mouth every morning.    ? Cholecalciferol (VITAMIN D3) 50 MCG (2000 UT) capsule Take 2,000 Units by mouth daily.     ? Continuous Blood Gluc Sensor (FREESTYLE LIBRE 2 SENSOR) MISC by Does not apply route.    ? ELIQUIS 5 MG TABS tablet Take 5 mg by mouth 2 (two) times daily.     ? finasteride (PROSCAR) 5 MG tablet TAKE 1 TABLET BY MOUTH ONCE DAILY. 90 tablet 0  ? HUMALOG KWIKPEN 100 UNIT/ML KiwkPen Inject 10-12 Units into the skin 3 (three) times daily. Inject 10u daily at breakfast, 10u at lunch and up to 12u at supper per SSI    ? Insulin  Disposable Pump (OMNIPOD DASH 5 PACK PODS) MISC     ? Insulin Pen Needle 33G X 4 MM MISC To use with insulin pen 4 times per day. E 13.9    ? LANTUS SOLOSTAR 100 UNIT/ML Solostar Pen Inject 24 Units into the skin at bedtime.     ? linagliptin (TRADJENTA) 5 MG TABS tablet Take 5 mg by mouth daily.     ? losartan (COZAAR) 50 MG tablet Take 50 mg by mouth daily.    ? omeprazole (PRILOSEC) 20 MG capsule Take 20 mg by mouth daily as needed.     ? PROGRAF 1 MG capsule Take 1 mg by mouth 2 (two) times daily.     ? tamsulosin (FLOMAX) 0.4 MG CAPS capsule Take 0.4 mg by mouth 2 (two) times daily.     ? trospium (SANCTURA) 20 MG tablet Take 1 tablet (20 mg total) by mouth at bedtime. 30 tablet 3  ? ?No current facility-administered medications for this visit.  ? ? ?Patient confirms/reports the following allergies:  ?Allergies  ?Allergen Reactions  ? Oxybutynin Other (See Comments)  ?  Incomplete bladder emptying  ? ? ?No orders of the defined types were placed in this encounter. ? ? ?AUTHORIZATION INFORMATION ?Primary Insurance: ?1D#: ?Group #: ? ?Secondary Insurance: ?1D#: ?Group #: ? ?SCHEDULE  INFORMATION: ?Date: 12/30/2021 ?Time: ?Location:[facility] ? ?

## 2021-12-21 NOTE — Progress Notes (Signed)
RESENT BLOOD THINNER

## 2021-12-22 ENCOUNTER — Telehealth: Payer: Self-pay

## 2021-12-22 NOTE — Telephone Encounter (Signed)
Called patient to let patient know he can stop his eliquis 2 days prior and restart 1 day after procedure  per DR. ADAMO patient understands

## 2021-12-30 ENCOUNTER — Ambulatory Visit: Payer: Medicare Other | Admitting: Anesthesiology

## 2021-12-30 ENCOUNTER — Encounter
Admission: RE | Disposition: A | Discharge status: Home / Self Care | Payer: Self-pay | Source: Ambulatory Visit | Attending: Gastroenterology

## 2021-12-30 ENCOUNTER — Ambulatory Visit
Admission: RE | Admit: 2021-12-30 | Discharge: 2021-12-30 | Disposition: A | Discharge status: Home / Self Care | Payer: Medicare Other | Source: Ambulatory Visit | Attending: Gastroenterology | Admitting: Gastroenterology

## 2021-12-30 DIAGNOSIS — Z1211 Encounter for screening for malignant neoplasm of colon: Secondary | ICD-10-CM | POA: Diagnosis not present

## 2021-12-30 DIAGNOSIS — I252 Old myocardial infarction: Secondary | ICD-10-CM | POA: Insufficient documentation

## 2021-12-30 DIAGNOSIS — Z940 Kidney transplant status: Secondary | ICD-10-CM | POA: Diagnosis not present

## 2021-12-30 DIAGNOSIS — Z7901 Long term (current) use of anticoagulants: Secondary | ICD-10-CM | POA: Insufficient documentation

## 2021-12-30 DIAGNOSIS — I2510 Atherosclerotic heart disease of native coronary artery without angina pectoris: Secondary | ICD-10-CM | POA: Insufficient documentation

## 2021-12-30 DIAGNOSIS — K219 Gastro-esophageal reflux disease without esophagitis: Secondary | ICD-10-CM | POA: Diagnosis not present

## 2021-12-30 DIAGNOSIS — Z951 Presence of aortocoronary bypass graft: Secondary | ICD-10-CM | POA: Insufficient documentation

## 2021-12-30 DIAGNOSIS — I130 Hypertensive heart and chronic kidney disease with heart failure and stage 1 through stage 4 chronic kidney disease, or unspecified chronic kidney disease: Secondary | ICD-10-CM | POA: Insufficient documentation

## 2021-12-30 DIAGNOSIS — Z7984 Long term (current) use of oral hypoglycemic drugs: Secondary | ICD-10-CM | POA: Diagnosis not present

## 2021-12-30 DIAGNOSIS — E1122 Type 2 diabetes mellitus with diabetic chronic kidney disease: Secondary | ICD-10-CM | POA: Insufficient documentation

## 2021-12-30 DIAGNOSIS — Z794 Long term (current) use of insulin: Secondary | ICD-10-CM | POA: Diagnosis not present

## 2021-12-30 DIAGNOSIS — N189 Chronic kidney disease, unspecified: Secondary | ICD-10-CM | POA: Diagnosis not present

## 2021-12-30 DIAGNOSIS — Z87891 Personal history of nicotine dependence: Secondary | ICD-10-CM | POA: Insufficient documentation

## 2021-12-30 DIAGNOSIS — Z79899 Other long term (current) drug therapy: Secondary | ICD-10-CM | POA: Insufficient documentation

## 2021-12-30 HISTORY — PX: COLONOSCOPY WITH PROPOFOL: SHX5780

## 2021-12-30 LAB — GLUCOSE, CAPILLARY: Glucose-Capillary: 94 mg/dL (ref 70–99)

## 2021-12-30 SURGERY — COLONOSCOPY WITH PROPOFOL
Anesthesia: General

## 2021-12-30 MED ORDER — PROPOFOL 10 MG/ML IV BOLUS
INTRAVENOUS | Status: DC | PRN
  Administered 2021-12-30: 60 mg via INTRAVENOUS

## 2021-12-30 MED ORDER — PROPOFOL 500 MG/50ML IV EMUL
INTRAVENOUS | Status: DC | PRN
  Administered 2021-12-30: 150 ug/kg/min via INTRAVENOUS

## 2021-12-30 MED ORDER — SODIUM CHLORIDE 0.9 % IV SOLN
INTRAVENOUS | Status: DC
  Administered 2021-12-30: 20 mL/h via INTRAVENOUS

## 2021-12-30 MED ORDER — LIDOCAINE HCL (CARDIAC) PF 100 MG/5ML IV SOSY
PREFILLED_SYRINGE | INTRAVENOUS | Status: DC | PRN
  Administered 2021-12-30: 50 mg via INTRAVENOUS

## 2021-12-30 NOTE — Op Note (Signed)
[facility] Gastroenterology Patient Name: [name] Procedure Date: 12/30/2021 7:16 AM MRN: [id number] Account #: [id number] Date of Birth: 12-17-1955 Admit Type: Outpatient Age: 66 Room: [facility] ENDO ROOM 3 Gender: Male Note Status: Finalized Instrument Name: Park Meo [id number] Procedure:             Colonoscopy Indications:           Screening for colorectal malignant neoplasm Providers:             Jonathon Bellows MD, MD Medicines:             Monitored Anesthesia Care Complications:         No immediate complications. Procedure:             Pre-Anesthesia Assessment:                        - Prior to the procedure, a History and Physical was                         performed, and patient medications, allergies and                         sensitivities were reviewed. The patient's tolerance                         of previous anesthesia was reviewed.                        - The risks and benefits of the procedure and the                         sedation options and risks were discussed with the                         patient. All questions were answered and informed                         consent was obtained.                        - ASA Grade Assessment: II - A patient with mild                         systemic disease.                        After obtaining informed consent, the colonoscope was                         passed under direct vision. Throughout the procedure,                         the patient's blood pressure, pulse, and oxygen                         saturations were monitored continuously. The                         Colonoscope was introduced through the anus and  advanced to the the cecum, identified by the                         appendiceal orifice. The colonoscopy was performed                         with ease. The patient tolerated the procedure well.                         The quality of the bowel  preparation was poor. Findings:      The perianal and digital rectal examinations were normal.      The entire examined colon appeared normal on direct and retroflexion       views.      A large amount of semi-liquid stool was found in the entire colon,       interfering with visualization. Impression:            - Preparation of the colon was poor.                        - The entire examined colon is normal on direct and                         retroflexion views.                        - Stool in the entire examined colon.                        - No specimens collected. Recommendation:        - Discharge patient to home (with escort).                        - Resume previous diet.                        - Continue present medications.                        - Repeat colonoscopy in 6 months because the bowel                         preparation was suboptimal. Procedure Code(s):     --- Professional ---                        [phone], Colonoscopy, flexible; diagnostic, including                         collection of specimen(s) by brushing or washing, when                         performed (separate procedure) Diagnosis Code(s):     --- Professional ---                        Z12.11, Encounter for screening for malignant neoplasm                         of colon CPT copyright 2019 American Medical Association. All rights reserved. The codes  documented in this report are preliminary and upon coder review may  be revised to meet current compliance requirements. Jonathon Bellows, MD Jonathon Bellows MD, MD 12/30/2021 8:03:13 AM This report has been signed electronically. Number of Addenda: 0 Note Initiated On: 12/30/2021 7:16 AM Scope Withdrawal Time: 0 hours 3 minutes 30 seconds  Total Procedure Duration: 0 hours 7 minutes 30 seconds  Estimated Blood Loss:  Estimated blood loss: none.      [facility]

## 2021-12-30 NOTE — Anesthesia Postprocedure Evaluation (Signed)
Anesthesia Post Note  Patient: [name]  Procedure(s) Performed: COLONOSCOPY WITH PROPOFOL  Patient location during evaluation: Endoscopy Anesthesia Type: General Level of consciousness: awake and alert Pain management: pain level controlled Vital Signs Assessment: post-procedure vital signs reviewed and stable Respiratory status: spontaneous breathing, nonlabored ventilation, respiratory function stable and patient connected to nasal cannula oxygen Cardiovascular status: blood pressure returned to baseline and stable Postop Assessment: no apparent nausea or vomiting Anesthetic complications: no   No notable events documented.   Last Vitals:  Vitals:   12/30/21 0815 12/30/21 0825  BP: 115/66 115/67  Pulse: 69   Resp: 18 15  Temp:    SpO2: 99% 99%    Last Pain:  Vitals:   12/30/21 0825  PainSc: 0-No pain                 Arita Miss

## 2021-12-30 NOTE — H&P (Signed)
Leonard Bellows, MD [address], [place], [place], [place], [address] [address], [place], [place], [place], [address] Phone: [phone]  Fax: [phone]  Primary Care Physician:  Leonard Richards, MD   Pre-Procedure History & Physical: HPI:  [name] is a 66 y.o. male is here for an colonoscopy.   Past Medical History:  Diagnosis Date   Arthritis    Chronic kidney disease    KIDNEY TRANSPLANT   Coronary artery disease    Diabetes mellitus without complication (HCC)    Dyspnea    GERD (gastroesophageal reflux disease)    History of blood transfusion    Hypertension    Myocardial infarction [facility])     Past Surgical History:  Procedure Laterality Date   CATARACT EXTRACTION W/PHACO Right 02/03/2017   Procedure: CATARACT EXTRACTION PHACO AND INTRAOCULAR LENS PLACEMENT ([place]);  Surgeon: Leonard Koyanagi, MD;  Location: [facility] ORS;  Service: Ophthalmology;  Laterality: Right;  [place] 00:35.8 AP% 12.8 CDE 4.57 Fluid lot # [id number]   CORONARY ANGIOPLASTY     STENTS X 4   CORONARY ARTERY BYPASS GRAFT     ENDARTERECTOMY Left 09/06/2019   Procedure: ENDARTERECTOMY CAROTID;  Surgeon: Leonard Huxley, MD;  Location: [facility] ORS;  Service: Vascular;  Laterality: Left;   EYE SURGERY Bilateral    cataract   HIP SURGERY     KIDNEY TRANSPLANT     2016    Prior to Admission medications   Medication Sig Start Date End Date Taking? Authorizing Provider  amLODipine (NORVASC) 10 MG tablet Take 10 mg by mouth daily.  08/06/16  Yes [provider]  ascorbic acid (VITAMIN C) 500 MG tablet Take 500 mg by mouth daily.   Yes [provider]  aspirin EC 81 MG EC tablet Take 1 tablet (81 mg total) by mouth daily at 6 (six) AM. 09/08/19  Yes [name], Janalyn Harder, PA-C  atorvastatin (LIPITOR) 40 MG tablet 40 mg daily at 6 PM.  12/23/14  Yes [provider]  carvedilol (COREG) 12.5 MG tablet Take 12.5 mg by mouth at bedtime. 03/11/20  Yes [provider]   CELLCEPT 250 MG capsule Take 250 mg by mouth 2 (two) times daily. 08/12/21  Yes [provider]  chlorthalidone (HYGROTON) 25 MG tablet Take 12.5 mg by mouth every morning. 08/26/20  Yes [provider]  Cholecalciferol (VITAMIN D3) 50 MCG (2000 UT) capsule Take 2,000 Units by mouth daily.  03/17/16  Yes [provider]  Continuous Blood Gluc Sensor (FREESTYLE LIBRE 2 SENSOR) MISC by Does not apply route.   Yes [provider]  ELIQUIS 5 MG TABS tablet Take 5 mg by mouth 2 (two) times daily.  04/21/18  Yes [provider]  finasteride (PROSCAR) 5 MG tablet TAKE 1 TABLET BY MOUTH ONCE DAILY. 11/12/21  Yes Leonard Espy, MD  HUMALOG KWIKPEN 100 UNIT/ML KiwkPen Inject 10-12 Units into the skin 3 (three) times daily. Inject 10u daily at breakfast, 10u at lunch and up to 12u at supper per SSI   Yes [provider]  Insulin Disposable Pump (OMNIPOD DASH 5 PACK PODS) MISC  06/12/20  Yes [provider]  Insulin Pen Needle 33G X 4 MM MISC To use with insulin pen 4 times per day. E 13.9 02/19/16  Yes [provider]  LANTUS SOLOSTAR 100 UNIT/ML Solostar Pen Inject 24 Units into the skin at bedtime.  01/18/15  Yes [provider]  linagliptin (TRADJENTA) 5 MG TABS  tablet Take 5 mg by mouth daily.  11/23/19  Yes [provider]  losartan (COZAAR) 50 MG tablet Take 50 mg by mouth daily. 08/26/20  Yes [provider]  omeprazole (PRILOSEC) 20 MG capsule Take 20 mg by mouth daily as needed.    Yes [provider]  PROGRAF 1 MG capsule Take 1 mg by mouth 2 (two) times daily.  08/11/16  Yes [provider]  tamsulosin (FLOMAX) 0.4 MG CAPS capsule Take 0.4 mg by mouth 2 (two) times daily.    Yes [provider]  trospium (SANCTURA) 20 MG tablet Take 1 tablet (20 mg total) by mouth at bedtime. 09/09/21  Yes [name], Leonard A, PA-C    Allergies as of 12/09/2021 - Review Complete 12/09/2021  Allergen  Reaction Noted   Oxybutynin Other (See Comments) 05/29/2021    Family History  Problem Relation Age of Onset   Drug abuse Sister    Drug abuse Brother     Social History   Socioeconomic History   Marital status: Married    Spouse name: Not on file   Number of children: Not on file   Years of education: Not on file   Highest education level: Not on file  Occupational History   Not on file  Tobacco Use   Smoking status: Former   Smokeless tobacco: Never   Tobacco comments:    30 years ago  Vaping Use   Vaping Use: Never used  Substance and Sexual Activity   Alcohol use: No    Alcohol/week: 0.0 standard drinks   Drug use: Never   Sexual activity: Not on file  Other Topics Concern   Not on file  Social History Narrative   Not on file   Social Determinants of Health   Financial Resource Strain: Not on file  Food Insecurity: Not on file  Transportation Needs: Not on file  Physical Activity: Not on file  Stress: Not on file  Social Connections: Not on file  Intimate Partner Violence: Not on file    Review of Systems: See HPI, otherwise negative ROS  Physical Exam: BP 135/66   Pulse 84   Temp (!) 96.8 F (36 C)   Resp 20   Ht 5\' 8"  (1.727 m)   Wt 75.3 kg   SpO2 100%   BMI 25.24 kg/m  General:   Alert,  pleasant and cooperative in NAD Head:  Normocephalic and atraumatic. Neck:  Supple; no masses or thyromegaly. Lungs:  Clear throughout to auscultation, normal respiratory effort.    Heart:  +S1, +S2, Regular rate and rhythm, No edema. Abdomen:  Soft, nontender and nondistended. Normal bowel sounds, without guarding, and without rebound.   Neurologic:  Alert and  oriented x4;  grossly normal neurologically.  Impression/Plan: [name] is here for an colonoscopy to be performed for Screening colonoscopy average risk   Risks, benefits, limitations, and alternatives regarding  colonoscopy have been reviewed with the patient.  Questions have been  answered.  All parties agreeable.   Leonard Bellows, MD  12/30/2021, 7:47 AM

## 2021-12-30 NOTE — Anesthesia Procedure Notes (Signed)
Procedure Name: MAC Date/Time: 12/30/2021 7:41 AM Performed by: Tollie Eth, CRNA Pre-anesthesia Checklist: Patient identified, Emergency Drugs available, Suction available and Patient being monitored Patient Re-evaluated:Patient Re-evaluated prior to induction Oxygen Delivery Method: Nasal cannula Induction Type: IV induction Placement Confirmation: positive ETCO2

## 2021-12-30 NOTE — Anesthesia Preprocedure Evaluation (Signed)
Anesthesia Evaluation  Patient identified by MRN, date of birth, ID band Patient awake    Reviewed: Allergy & Precautions, NPO status , Patient's Chart, lab work & pertinent test results  History of Anesthesia Complications Negative for: history of anesthetic complications  Airway Mallampati: III  TM Distance: >3 FB Neck ROM: Full    Dental  (+) Missing, Partial Upper,    Pulmonary neg pulmonary ROS, neg sleep apnea, neg COPD, Patient abstained from smoking.Not current smoker, former smoker,    Pulmonary exam normal breath sounds clear to auscultation       Cardiovascular Exercise Tolerance: Good METShypertension, + CAD, + Past MI, + Cardiac Stents, + CABG and +CHF  (-) dysrhythmias  Rhythm:Regular Rate:Normal - Systolic murmurs TTE 2263: Left ventricle: The cavity size was moderately dilated. Systolic  function was mildly to moderately reduced. The estimated ejection  fraction was 45%. Diffuse hypokinesis. The study is not  technically sufficient to allow evaluation of LV diastolic  function.  - Aortic valve: Valve area (Vmax): 2.21 cm^2.  - Mitral valve: There was mild regurgitation.  - Left atrium: The atrium was mildly dilated.  - Right ventricle: The cavity size was moderately dilated.  - Right atrium: The atrium was mildly dilated.    Neuro/Psych negative neurological ROS  negative psych ROS   GI/Hepatic GERD  ,(+)     (-) substance abuse  ,   Endo/Other  diabetes, Insulin Dependent  Renal/GU CRFRenal disease     Musculoskeletal   Abdominal   Peds  Hematology   Anesthesia Other Findings Past Medical History: No date: Arthritis No date: Chronic kidney disease     Comment:  KIDNEY TRANSPLANT No date: Coronary artery disease No date: Diabetes mellitus without complication (HCC) No date: Dyspnea No date: GERD (gastroesophageal reflux disease) No date: History of blood transfusion No date:  Hypertension No date: Myocardial infarction ([place])  Reproductive/Obstetrics                             Anesthesia Physical  Anesthesia Plan  ASA: 3  Anesthesia Plan: General   Post-op Pain Management: Minimal or no pain anticipated   Induction: Intravenous  PONV Risk Score and Plan: 2 and Ondansetron, TIVA and Propofol infusion  Airway Management Planned: Natural Airway  Additional Equipment: None  Intra-op Plan:   Post-operative Plan:   Informed Consent: I have reviewed the patients History and Physical, chart, labs and discussed the procedure including the risks, benefits and alternatives for the proposed anesthesia with the patient or authorized representative who has indicated his/her understanding and acceptance.     Dental advisory given  Plan Discussed with: CRNA and Surgeon  Anesthesia Plan Comments: (Discussed risks of anesthesia with patient, including possibility of difficulty with spontaneous ventilation under anesthesia necessitating airway intervention, PONV, and rare risks such as cardiac or respiratory or neurological events, and allergic reactions. Discussed the role of CRNA in patient's perioperative care. Patient understands.)        Anesthesia Quick Evaluation

## 2021-12-30 NOTE — Transfer of Care (Signed)
Immediate Anesthesia Transfer of Care Note  Patient: ALBEN JEPSEN  Procedure(s) Performed: COLONOSCOPY WITH PROPOFOL  Patient Location: PACU  Anesthesia Type:General  Level of Consciousness: drowsy  Airway & Oxygen Therapy: Patient Spontanous Breathing  Post-op Assessment: Report given to RN and Post -op Vital signs reviewed and stable  Post vital signs: Reviewed and stable  Last Vitals:  Vitals Value Taken Time  BP 130/53 12/30/21 0805  Temp    Pulse 70 12/30/21 0805  Resp 18 12/30/21 0805  SpO2 98 % 12/30/21 0805    Last Pain:  Vitals:   12/30/21 0805  PainSc: Asleep         Complications: No notable events documented.

## 2021-12-31 ENCOUNTER — Encounter: Payer: Self-pay | Admitting: Gastroenterology

## 2022-01-12 DIAGNOSIS — Z940 Kidney transplant status: Principal | ICD-10-CM

## 2022-01-12 DIAGNOSIS — R8279 Other abnormal findings on microbiological examination of urine: Principal | ICD-10-CM

## 2022-01-12 MED ORDER — CELLCEPT 250 MG CAPSULE
ORAL_CAPSULE | Freq: Two times a day (BID) | ORAL | 3 refills | 90 days | Status: CP
Start: 2022-01-12 — End: 2023-01-12

## 2022-01-13 ENCOUNTER — Ambulatory Visit: Admit: 2022-01-13 | Discharge: 2022-01-14 | Payer: MEDICARE

## 2022-01-13 DIAGNOSIS — Z940 Kidney transplant status: Principal | ICD-10-CM

## 2022-01-13 LAB — COMPREHENSIVE METABOLIC PANEL
ALBUMIN: 4.1 g/dL (ref 3.4–5.0)
ALKALINE PHOSPHATASE: 54 U/L (ref 46–116)
ALT (SGPT): 14 U/L (ref 10–49)
ANION GAP: 8 mmol/L (ref 5–14)
AST (SGOT): 21 U/L (ref <=34)
BILIRUBIN TOTAL: 1.1 mg/dL (ref 0.3–1.2)
BLOOD UREA NITROGEN: 21 mg/dL (ref 9–23)
BUN / CREAT RATIO: 18
CALCIUM: 9.7 mg/dL (ref 8.7–10.4)
CHLORIDE: 103 mmol/L (ref 98–107)
CO2: 30.4 mmol/L (ref 20.0–31.0)
CREATININE: 1.17 mg/dL — ABNORMAL HIGH
EGFR CKD-EPI (2021) MALE: 68 mL/min/{1.73_m2} (ref >=60)
GLUCOSE RANDOM: 128 mg/dL — ABNORMAL HIGH (ref 70–99)
POTASSIUM: 4.3 mmol/L (ref 3.4–4.8)
PROTEIN TOTAL: 7.3 g/dL (ref 5.7–8.2)
SODIUM: 141 mmol/L (ref 135–145)

## 2022-01-13 LAB — URINALYSIS WITH MICROSCOPY
BILIRUBIN UA: NEGATIVE
BLOOD UA: NEGATIVE
GLUCOSE UA: NEGATIVE
KETONES UA: NEGATIVE
LEUKOCYTE ESTERASE UA: NEGATIVE
NITRITE UA: NEGATIVE
PH UA: 6.5 (ref 5.0–9.0)
RBC UA: 1 /HPF (ref <=3)
SPECIFIC GRAVITY UA: 1.026 (ref 1.003–1.030)
SQUAMOUS EPITHELIAL: 4 /HPF (ref 0–5)
UROBILINOGEN UA: 3 — AB
WBC UA: 1 /HPF (ref <=2)

## 2022-01-13 LAB — MAGNESIUM: MAGNESIUM: 1.6 mg/dL (ref 1.6–2.6)

## 2022-01-13 LAB — TACROLIMUS LEVEL, TROUGH: TACROLIMUS, TROUGH: 7.8 ng/mL (ref 5.0–15.0)

## 2022-01-13 LAB — PHOSPHORUS: PHOSPHORUS: 4 mg/dL (ref 2.4–5.1)

## 2022-01-14 LAB — CMV DNA, QUANTITATIVE, PCR: CMV VIRAL LD: NOT DETECTED

## 2022-01-15 LAB — BK VIRUS QUANTITATIVE PCR, BLOOD
BK BLOOD QUANT: <200 [IU]/mL — ABNORMAL HIGH (ref <=0)
BK BLOOD RESULT: DETECTED — AB

## 2022-01-15 NOTE — Unmapped (Signed)
01/15/22-Patient provider sent a new script to [facility] for pt to fill Cellcept.I provided pt with their # so he can follow-up ,we will reach back out to pt in 3 weeks before dis-enrolling-CB

## 2022-01-21 DIAGNOSIS — Z940 Kidney transplant status: Principal | ICD-10-CM

## 2022-01-21 DIAGNOSIS — Z79899 Other long term (current) drug therapy: Principal | ICD-10-CM

## 2022-01-21 DIAGNOSIS — R8279 Other abnormal findings on microbiological examination of urine: Principal | ICD-10-CM

## 2022-01-21 LAB — HLA DS POST TRANSPLANT
ANTI-DONOR DRW #1 MFI: 123 MFI
ANTI-DONOR DRW #2 MFI: 229 MFI
ANTI-DONOR HLA-A #1 MFI: 0 MFI
ANTI-DONOR HLA-A #2 MFI: 0 MFI
ANTI-DONOR HLA-B #1 MFI: 0 MFI
ANTI-DONOR HLA-B #2 MFI: 0 MFI
ANTI-DONOR HLA-C #2 MFI: 0 MFI
ANTI-DONOR HLA-DQB #1 MFI: 0 MFI
ANTI-DONOR HLA-DQB #2 MFI: 32 MFI
ANTI-DONOR HLA-DR #1 MFI: 162 MFI
ANTI-DONOR HLA-DR #2 MFI: 0 MFI

## 2022-01-21 LAB — FSAB CLASS 1 ANTIBODY SPECIFICITY: HLA CLASS 1 ANTIBODY RESULT: NEGATIVE

## 2022-01-21 LAB — FSAB CLASS 2 ANTIBODY SPECIFICITY
CLASS 2 ANTIBODIES IDENTIFIED: 1:6 {titer}
HLA CL2 AB RESULT: POSITIVE

## 2022-02-15 NOTE — Unmapped (Signed)
Specialty Medication(s): Cellcept    Mr.Abraha has been dis-enrolled from the [facility] specialty pharmacy services due to a pharmacy change. The patient is now filling at [facility] .    Additional information provided to the patient: n/a    Tera Helper  [facility] Specialty Pharmacist

## 2022-02-15 NOTE — Unmapped (Unsigned)
Verified with patient that he is no longer filling his medication with our pharmacy and will be disenrolled

## 2022-03-03 NOTE — Progress Notes (Unsigned)
03/04/2022 9:44 PM   [name] 1956-04-20 [id number]  Referring provider: Frazier Richards, [address] [place],  [place] [address]  Urological history: 1. BPH with LU TS -PSA 0.2 in 03/2021 -prostate volume 43 gram TRUS 2022 -cysto 2022 NED -I PSS 13/2 -PVR 0 mL -managed with finasteride 5 mg daily and tamsulosin 0.4 mg daily   2. Nocturia -Risk factors for nocturia: hypertension, diabetes, heart disease and BPH  3. ED -contributing factors of age, HTN, DM, BPH, heart disease, MI, HLD and former smoker -testosterone, 10/2021 - 556 -SHIM *** -  No chief complaint on file.   HPI: [name] is a 66 y.o. male who presents today for follow up.        Score:  1-7 Mild 8-19 Moderate 20-35 Severe       Score: 1-7 Severe ED 8-11 Moderate ED 12-16 Mild-Moderate ED 17-21 Mild ED 22-25 No ED   PMH: Past Medical History:  Diagnosis Date   Arthritis    Chronic kidney disease    KIDNEY TRANSPLANT   Coronary artery disease    Diabetes mellitus without complication (HCC)    Dyspnea    GERD (gastroesophageal reflux disease)    History of blood transfusion    Hypertension    Myocardial infarction [facility])     Surgical History: Past Surgical History:  Procedure Laterality Date   CATARACT EXTRACTION W/PHACO Right 02/03/2017   Procedure: CATARACT EXTRACTION PHACO AND INTRAOCULAR LENS PLACEMENT ([place]);  Surgeon: Leandrew Koyanagi, MD;  Location: [facility] ORS;  Service: Ophthalmology;  Laterality: Right;  [place] 00:35.8 AP% 12.8 CDE 4.57 Fluid lot # [id number]   COLONOSCOPY WITH PROPOFOL N/A 12/30/2021   Procedure: COLONOSCOPY WITH PROPOFOL;  Surgeon: Jonathon Bellows, MD;  Location: [facility] ENDOSCOPY;  Service: Gastroenterology;  Laterality: N/A;   CORONARY ANGIOPLASTY     STENTS X 4   CORONARY ARTERY BYPASS GRAFT     ENDARTERECTOMY Left 09/06/2019   Procedure: ENDARTERECTOMY CAROTID;  Surgeon: Algernon Huxley, MD;  Location: [facility] ORS;  Service: Vascular;   Laterality: Left;   EYE SURGERY Bilateral    cataract   HIP SURGERY     KIDNEY TRANSPLANT     2016    Home Medications:  Allergies as of 03/04/2022       Reactions   Oxybutynin Other (See Comments)   Incomplete bladder emptying        Medication List        Accurate as of March 03, 2022  9:44 PM. If you have any questions, ask your nurse or doctor.          amLODipine 10 MG tablet Commonly known as: NORVASC Take 10 mg by mouth daily.   ascorbic acid 500 MG tablet Commonly known as: VITAMIN C Take 500 mg by mouth daily.   aspirin EC 81 MG tablet Take 1 tablet (81 mg total) by mouth daily at 6 (six) AM.   atorvastatin 40 MG tablet Commonly known as: LIPITOR 40 mg daily at 6 PM.   carvedilol 12.5 MG tablet Commonly known as: COREG Take 12.5 mg by mouth at bedtime.   CellCept 250 MG capsule Generic drug: mycophenolate Take 250 mg by mouth 2 (two) times daily.   chlorthalidone 25 MG tablet Commonly known as: HYGROTON Take 12.5 mg by mouth every morning.   Eliquis 5 MG Tabs tablet Generic drug: apixaban Take 5 mg by mouth 2 (two) times daily.   finasteride 5 MG tablet Commonly  known as: PROSCAR TAKE 1 TABLET BY MOUTH ONCE DAILY.   FreeStyle Libre 2 Sensor Misc by Does not apply route.   HumaLOG KwikPen 100 UNIT/ML KwikPen Generic drug: insulin lispro Inject 10-12 Units into the skin 3 (three) times daily. Inject 10u daily at breakfast, 10u at lunch and up to 12u at supper per SSI   Insulin Pen Needle 33G X 4 MM Misc To use with insulin pen 4 times per day. E 13.9   Lantus SoloStar 100 UNIT/ML Solostar Pen Generic drug: insulin glargine Inject 24 Units into the skin at bedtime.   linagliptin 5 MG Tabs tablet Commonly known as: TRADJENTA Take 5 mg by mouth daily.   losartan 50 MG tablet Commonly known as: COZAAR Take 50 mg by mouth daily.   omeprazole 20 MG capsule Commonly known as: PRILOSEC Take 20 mg by mouth daily as needed.   Omnipod  DASH Pods (Gen 4) Misc   Prograf 1 MG capsule Generic drug: tacrolimus Take 1 mg by mouth 2 (two) times daily.   tamsulosin 0.4 MG Caps capsule Commonly known as: FLOMAX Take 0.4 mg by mouth 2 (two) times daily.   trospium 20 MG tablet Commonly known as: SANCTURA Take 1 tablet (20 mg total) by mouth at bedtime.   Vitamin D3 50 MCG (2000 UT) capsule Take 2,000 Units by mouth daily.        Allergies:  Allergies  Allergen Reactions   Oxybutynin Other (See Comments)    Incomplete bladder emptying    Family History: Family History  Problem Relation Age of Onset   Drug abuse Sister    Drug abuse Brother     Social History:  reports that he has quit smoking. He has never used smokeless tobacco. He reports that he does not drink alcohol and does not use drugs.  ROS: Pertinent ROS in HPI  Physical Exam: There were no vitals taken for this visit.  Constitutional:  Well nourished. Alert and oriented, No acute distress. HEENT: [place] AT, moist mucus membranes.  Trachea midline Cardiovascular: No clubbing, cyanosis, or edema. Respiratory: Normal respiratory effort, no increased work of breathing. GU: No CVA tenderness.  No bladder fullness or masses.  Patient with circumcised/uncircumcised phallus. ***Foreskin easily retracted***  Urethral meatus is patent.  No penile discharge. No penile lesions or rashes. Scrotum without lesions, cysts, rashes and/or edema.  Testicles are located scrotally bilaterally. No masses are appreciated in the testicles. Left and right epididymis are normal. Rectal: Patient with  normal sphincter tone. Anus and perineum without scarring or rashes. No rectal masses are appreciated. Prostate is approximately *** grams, *** nodules are appreciated. Seminal vesicles are normal. Neurologic: Grossly intact, no focal deficits, moving all 4 extremities. Psychiatric: Normal mood and affect.   Laboratory Data: Sodium 135 - 145 mmol/L 141   Potassium 3.4 - 4.8  mmol/L 4.3   Chloride 98 - 107 mmol/L 103   CO2 20.0 - 31.0 mmol/L 30.4   Anion Gap 5 - 14 mmol/L 8   BUN 9 - 23 mg/dL 21   Creatinine 0.60 - 1.10 mg/dL 1.17 High    BUN/Creatinine Ratio  18   eGFR CKD-EPI (2021) Male >=60 mL/min/1.65m 68   Comment: eGFR calculated with CKD-EPI 2021 equation in accordance with [employer] [place] [employer] Neph[employer] Task Force recommendations.  Glucose 70 - 99 mg/dL 128 High    Calcium 8.7 - 10.4 mg/dL 9.7   Albumin 3.4 - 5.0 g/dL 4.1   Total Protein  5.7 - 8.2 g/dL 7.3   Total Bilirubin 0.3 - 1.2 mg/dL 1.1   AST <=34 U/L 21   ALT 10 - 49 U/L 14   Alkaline Phosphatase 46 - 116 U/L 54   Resulting Agency  [facility]   Specimen Collected: 01/13/22 08:00   Performed by: [facility] Last Resulted: 01/13/22 08:54  Received From: [place]  Result Received: 01/19/22 09:39   Color, UA  Yellow   Clarity, UA  Clear   Specific Gravity, UA 1.003 - 1.030 1.026   pH, UA 5.0 - 9.0 6.5   Leukocyte Esterase, UA Negative Negative   Nitrite, UA Negative Negative   Protein, UA Negative Trace Abnormal    Glucose, UA Negative Negative   Ketones, UA Negative Negative   Urobilinogen, UA <2.0 mg/dL 3.0 mg/dL Abnormal    Bilirubin, UA Negative Negative   Blood, UA Negative Negative   RBC, UA <=3 /HPF 1   WBC, UA <=2 /HPF 1   Squam Epithel, UA 0 - 5 /HPF 4   Bacteria, UA None Seen /HPF Rare Abnormal    Mucus, UA None Seen /HPF Rare Abnormal    Resulting Agency  [facility]   Specimen Collected: 01/13/22 08:00   Performed by: [facility] Last Resulted: 01/13/22 08:51  Received From: [place]  Result Received: 02/26/22 09:18   Hemoglobin A1C 4.2 - 5.6 % 6.9 High    Average Blood Glucose (Calc) mg/dL 151   Resulting [place] - LAB  Narrative Performed by [place] - [facility] Normal Range:    4.2 - 5.6%  Increased Risk:  5.7 - 6.4%   Diabetes:        >= 6.5%  Glycemic Control for adults with diabetes:  <7%    Specimen Collected: 11/26/21 10:25 Last Resulted: 11/26/21 11:46  Received From: [place]  Result Received: 12/09/21 10:01  I have reviewed the labs.     Pertinent Imaging: ***   Assessment & Plan:    1. BPH with LUTS -PSA stable -PVR < 300 cc -most bothersome symptoms are nocturia -continue conservative management, avoiding bladder irritants and timed voiding's -Continue tamsulosin 0.4 mg daily and finasteride 5 mg daily  2. Nocturia -Minimal improvement with the Myrbetriq and the trospium, but he states he does not work so we can take naps during the day so its not bothersome -Patient states he had 2 sleep studies but he is never heard the results -I tried to contact the sleep lab, but had to leave a message -I also looked through epic to see if I could see test results or evidence of a sleep study and I did not find any -Encouraged him to speak with his primary care provider about going over those results or rescheduling sleep study if warranted  3. ED -We will check a testosterone level at this time per AUA guidelines, the patient is not interested in taking any medications for this at this time  No follow-ups on file.  These notes generated with voice recognition software. I apologize for typographical errors.  Zara Council, PA-C  [facility] [address]  [place], [place] [address] [phone]

## 2022-03-04 ENCOUNTER — Encounter: Payer: Self-pay | Admitting: Urology

## 2022-03-04 ENCOUNTER — Ambulatory Visit: Payer: Medicare Other | Admitting: Urology

## 2022-03-04 VITALS — BP 128/65 | HR 91 | Ht 68.0 in | Wt 169.0 lb

## 2022-03-04 DIAGNOSIS — R351 Nocturia: Secondary | ICD-10-CM

## 2022-03-04 DIAGNOSIS — N138 Other obstructive and reflux uropathy: Secondary | ICD-10-CM

## 2022-03-04 DIAGNOSIS — N401 Enlarged prostate with lower urinary tract symptoms: Secondary | ICD-10-CM

## 2022-03-04 DIAGNOSIS — N529 Male erectile dysfunction, unspecified: Secondary | ICD-10-CM

## 2022-03-04 MED ORDER — TROSPIUM CHLORIDE 20 MG PO TABS: 20.0000 mg | ORAL_TABLET | Freq: Every day | ORAL | 3 refills | Status: DC

## 2022-03-05 ENCOUNTER — Ambulatory Visit: Admit: 2022-03-05 | Discharge: 2022-03-05 | Disposition: A | Discharge status: Home / Self Care | Payer: MEDICARE

## 2022-03-05 DIAGNOSIS — R339 Retention of urine, unspecified: Principal | ICD-10-CM

## 2022-03-05 LAB — PSA: Prostate Specific Ag, Serum: 0.2 ng/mL (ref 0.0–4.0)

## 2022-03-08 DIAGNOSIS — N419 Inflammatory disease of prostate, unspecified: Principal | ICD-10-CM

## 2022-03-08 DIAGNOSIS — Z940 Kidney transplant status: Principal | ICD-10-CM

## 2022-03-11 ENCOUNTER — Ambulatory Visit: Payer: Medicare Other | Admitting: Physician Assistant

## 2022-03-11 ENCOUNTER — Ambulatory Visit: Payer: Medicare Other | Admitting: Urology

## 2022-03-11 VITALS — BP 130/64 | HR 87

## 2022-03-11 DIAGNOSIS — N138 Other obstructive and reflux uropathy: Secondary | ICD-10-CM

## 2022-03-11 DIAGNOSIS — R339 Retention of urine, unspecified: Secondary | ICD-10-CM

## 2022-03-11 DIAGNOSIS — N401 Enlarged prostate with lower urinary tract symptoms: Secondary | ICD-10-CM

## 2022-03-11 LAB — BLADDER SCAN AMB NON-IMAGING

## 2022-03-11 NOTE — Progress Notes (Signed)
03/11/2022 3:07 PM   [name] 1956/02/28 [id number]  CC: Chief Complaint  Patient presents with   Urinary Retention   HPI: [name] is a 66 y.o. male with PMH BPH with LUTS on Flomax and finasteride, nocturia, and ED who recently developed urinary retention after starting Myrbetriq for nocturia who presents today for evaluation of possible nondraining Foley catheter.  He was seen at the [facility] ED for retention 6 days ago requiring Foley catheter placement.  UA was rather bland and urine culture finalized with no growth.  Urinary retention occurred within the context of readding Myrbetriq to his nighttime drug regimen for nocturia.  He had been on trospium for some time.  Myrbetriq has been stopped.  Today he reports intermittent sensations of urinary urgency and burning associated with some leakage around the catheter.  He has had some gross hematuria without clot passage.  He denies fever, chills, nausea, vomiting, persistent lower abdominal pain, or testicular pain or swelling.  Bladder scan with 0 mL today.  PMH: Past Medical History:  Diagnosis Date   Arthritis    Chronic kidney disease    KIDNEY TRANSPLANT   Coronary artery disease    Diabetes mellitus without complication (HCC)    Dyspnea    GERD (gastroesophageal reflux disease)    History of blood transfusion    Hypertension    Myocardial infarction [facility])     Surgical History: Past Surgical History:  Procedure Laterality Date   CATARACT EXTRACTION W/PHACO Right 02/03/2017   Procedure: CATARACT EXTRACTION PHACO AND INTRAOCULAR LENS PLACEMENT ([place]);  Surgeon: Leandrew Koyanagi, MD;  Location: [facility] ORS;  Service: Ophthalmology;  Laterality: Right;  [place] 00:35.8 AP% 12.8 CDE 4.57 Fluid lot # [id number]   COLONOSCOPY WITH PROPOFOL N/A 12/30/2021   Procedure: COLONOSCOPY WITH PROPOFOL;  Surgeon: Jonathon Bellows, MD;  Location: [facility] ENDOSCOPY;  Service: Gastroenterology;  Laterality: N/A;    CORONARY ANGIOPLASTY     STENTS X 4   CORONARY ARTERY BYPASS GRAFT     ENDARTERECTOMY Left 09/06/2019   Procedure: ENDARTERECTOMY CAROTID;  Surgeon: Algernon Huxley, MD;  Location: [facility] ORS;  Service: Vascular;  Laterality: Left;   EYE SURGERY Bilateral    cataract   HIP SURGERY     KIDNEY TRANSPLANT     2016    Home Medications:  Allergies as of 03/11/2022       Reactions   Oxybutynin Other (See Comments)   Incomplete bladder emptying        Medication List        Accurate as of March 11, 2022  3:07 PM. If you have any questions, ask your nurse or doctor.          amLODipine 10 MG tablet Commonly known as: NORVASC Take 10 mg by mouth daily.   ascorbic acid 500 MG tablet Commonly known as: VITAMIN C Take 500 mg by mouth daily.   aspirin EC 81 MG tablet Take 1 tablet (81 mg total) by mouth daily at 6 (six) AM.   atorvastatin 40 MG tablet Commonly known as: LIPITOR 40 mg daily at 6 PM.   carvedilol 12.5 MG tablet Commonly known as: COREG Take 12.5 mg by mouth at bedtime.   CellCept 250 MG capsule Generic drug: mycophenolate Take 250 mg by mouth 2 (two) times daily.   chlorthalidone 25 MG tablet Commonly known as: HYGROTON Take 12.5 mg by mouth every morning.   Eliquis 5 MG Tabs tablet Generic drug: apixaban Take 5  mg by mouth 2 (two) times daily.   finasteride 5 MG tablet Commonly known as: PROSCAR TAKE 1 TABLET BY MOUTH ONCE DAILY.   FreeStyle Libre 2 Sensor Misc by Does not apply route.   HumaLOG KwikPen 100 UNIT/ML KwikPen Generic drug: insulin lispro Inject 10-12 Units into the skin 3 (three) times daily. Inject 10u daily at breakfast, 10u at lunch and up to 12u at supper per SSI   Insulin Pen Needle 33G X 4 MM Misc To use with insulin pen 4 times per day. E 13.9   Lantus SoloStar 100 UNIT/ML Solostar Pen Generic drug: insulin glargine Inject 24 Units into the skin at bedtime.   linagliptin 5 MG Tabs tablet Commonly known as:  TRADJENTA Take 5 mg by mouth daily.   losartan 50 MG tablet Commonly known as: COZAAR Take 50 mg by mouth daily.   omeprazole 20 MG capsule Commonly known as: PRILOSEC Take 20 mg by mouth daily as needed.   Omnipod DASH Pods (Gen 4) Misc   Prograf 1 MG capsule Generic drug: tacrolimus Take 1 mg by mouth 2 (two) times daily.   tamsulosin 0.4 MG Caps capsule Commonly known as: FLOMAX Take 0.4 mg by mouth 2 (two) times daily.   trospium 20 MG tablet Commonly known as: SANCTURA Take 1 tablet (20 mg total) by mouth at bedtime.   Vitamin D3 50 MCG (2000 UT) capsule Take 2,000 Units by mouth daily.        Allergies:  Allergies  Allergen Reactions   Oxybutynin Other (See Comments)    Incomplete bladder emptying    Family History: Family History  Problem Relation Age of Onset   Drug abuse Sister    Drug abuse Brother     Social History:   reports that he has quit smoking. He has never used smokeless tobacco. He reports that he does not drink alcohol and does not use drugs.  Physical Exam: BP 130/64   Pulse 87   Constitutional:  Alert and oriented, no acute distress, nontoxic appearing HEENT: [place], AT Cardiovascular: No clubbing, cyanosis, or edema Respiratory: Normal respiratory effort, no increased work of breathing Skin: No rashes, bruises or suspicious lesions Neurologic: Grossly intact, no focal deficits, moving all 4 extremities Psychiatric: Normal mood and affect  Laboratory Data: Results for orders placed or performed in visit on 03/11/22  Bladder Scan (Post Void Residual) in office  Result Value Ref Range   Scan Result 63mL    Assessment & Plan:   1. Urinary retention Intermittent urinary urgency and leakage around his catheter consistent with bladder spasms.  I do not think he is clinically infected today.  I am hesitant to start him on another medication to manage his bladder spasms, because I think it will predictably lead him to fail his upcoming  voiding trial.  He has been off Myrbetriq now for 6 days.  I offered him to move his upcoming voiding trial sooner, and he agreed.  Okay to continue trospium in the meantime.  He is in agreement with this plan. - Bladder Scan (Post Void Residual) in office  Return in 4 days (on 03/15/2022) for Voiding trial.  Debroah Loop, PA-C  [facility] [address] [place], [place] [address] [phone]

## 2022-03-15 ENCOUNTER — Ambulatory Visit: Payer: Medicare Other | Admitting: Physician Assistant

## 2022-03-15 ENCOUNTER — Encounter: Payer: Self-pay | Admitting: Physician Assistant

## 2022-03-15 DIAGNOSIS — R339 Retention of urine, unspecified: Secondary | ICD-10-CM

## 2022-03-15 LAB — BLADDER SCAN AMB NON-IMAGING: SCA Result: 92

## 2022-03-15 NOTE — Progress Notes (Signed)
03/15/2022 9:01 AM   [name] [birth year] [id number]  CC: Chief Complaint  Patient presents with   Urinary Retention   HPI: [name] is a 66 y.o. male with PMH BPH with LUTS on Flomax and finasteride, nocturia trospium, and ED who recently developed urinary retention after adding Myrbetriq for nocturia who presents today for voiding trial.   Today he reports continues to have bothersome bladder spasms with his Foley catheter in place.  Foley catheter removed in the morning, see procedure note below.  He returned to clinic in the afternoon for bladder scan.  He reports drinking 64oz of fluid.  He has been able to void.  PVR 32mL. He reports feeling better than he has in days and is very pleased.  PMH: Past Medical History:  Diagnosis Date   Arthritis    Chronic kidney disease    KIDNEY TRANSPLANT   Coronary artery disease    Diabetes mellitus without complication (HCC)    Dyspnea    GERD (gastroesophageal reflux disease)    History of blood transfusion    Hypertension    Myocardial infarction [facility])     Surgical History: Past Surgical History:  Procedure Laterality Date   CATARACT EXTRACTION W/PHACO Right 02/03/2017   Procedure: CATARACT EXTRACTION PHACO AND INTRAOCULAR LENS PLACEMENT ([place]);  Surgeon: Leandrew Koyanagi, MD;  Location: [facility] ORS;  Service: Ophthalmology;  Laterality: Right;  [place] 00:35.8 AP% 12.8 CDE 4.57 Fluid lot # [id number]   COLONOSCOPY WITH PROPOFOL N/A 12/30/2021   Procedure: COLONOSCOPY WITH PROPOFOL;  Surgeon: Jonathon Bellows, MD;  Location: [facility] ENDOSCOPY;  Service: Gastroenterology;  Laterality: N/A;   CORONARY ANGIOPLASTY     STENTS X 4   CORONARY ARTERY BYPASS GRAFT     ENDARTERECTOMY Left 09/06/2019   Procedure: ENDARTERECTOMY CAROTID;  Surgeon: Algernon Huxley, MD;  Location: [facility] ORS;  Service: Vascular;  Laterality: Left;   EYE SURGERY Bilateral    cataract   HIP SURGERY     KIDNEY TRANSPLANT     2016    Home Medications:   Allergies as of 03/15/2022       Reactions   Oxybutynin Other (See Comments)   Incomplete bladder emptying        Medication List        Accurate as of March 15, 2022  9:01 AM. If you have any questions, ask your nurse or doctor.          amLODipine 10 MG tablet Commonly known as: NORVASC Take 10 mg by mouth daily.   ascorbic acid 500 MG tablet Commonly known as: VITAMIN C Take 500 mg by mouth daily.   aspirin EC 81 MG tablet Take 1 tablet (81 mg total) by mouth daily at 6 (six) AM.   atorvastatin 40 MG tablet Commonly known as: LIPITOR 40 mg daily at 6 PM.   carvedilol 12.5 MG tablet Commonly known as: COREG Take 12.5 mg by mouth at bedtime.   CellCept 250 MG capsule Generic drug: mycophenolate Take 250 mg by mouth 2 (two) times daily.   chlorthalidone 25 MG tablet Commonly known as: HYGROTON Take 12.5 mg by mouth every morning.   Eliquis 5 MG Tabs tablet Generic drug: apixaban Take 5 mg by mouth 2 (two) times daily.   finasteride 5 MG tablet Commonly known as: PROSCAR TAKE 1 TABLET BY MOUTH ONCE DAILY.   FreeStyle Libre 2 Sensor Misc by Does not apply route.   HumaLOG KwikPen 100 UNIT/ML KwikPen Generic drug: insulin  lispro Inject 10-12 Units into the skin 3 (three) times daily. Inject 10u daily at breakfast, 10u at lunch and up to 12u at supper per SSI   Insulin Pen Needle 33G X 4 MM Misc To use with insulin pen 4 times per day. E 13.9   Lantus SoloStar 100 UNIT/ML Solostar Pen Generic drug: insulin glargine Inject 24 Units into the skin at bedtime.   linagliptin 5 MG Tabs tablet Commonly known as: TRADJENTA Take 5 mg by mouth daily.   losartan 50 MG tablet Commonly known as: COZAAR Take 50 mg by mouth daily.   omeprazole 20 MG capsule Commonly known as: PRILOSEC Take 20 mg by mouth daily as needed.   Omnipod DASH Pods (Gen 4) Misc   Prograf 1 MG capsule Generic drug: tacrolimus Take 1 mg by mouth 2 (two) times daily.    tamsulosin 0.4 MG Caps capsule Commonly known as: FLOMAX Take 0.4 mg by mouth 2 (two) times daily.   trospium 20 MG tablet Commonly known as: SANCTURA Take 1 tablet (20 mg total) by mouth at bedtime.   Vitamin D3 50 MCG (2000 UT) capsule Take 2,000 Units by mouth daily.        Allergies:  Allergies  Allergen Reactions   Oxybutynin Other (See Comments)    Incomplete bladder emptying    Family History: Family History  Problem Relation Age of Onset   Drug abuse Sister    Drug abuse Brother     Social History:   reports that he has quit smoking. He has never used smokeless tobacco. He reports that he does not drink alcohol and does not use drugs.  Physical Exam: There were no vitals taken for this visit.  Constitutional:  Alert and oriented, no acute distress, nontoxic appearing HEENT: [place], AT Cardiovascular: No clubbing, cyanosis, or edema Respiratory: Normal respiratory effort, no increased work of breathing Skin: No rashes, bruises or suspicious lesions Neurologic: Grossly intact, no focal deficits, moving all 4 extremities Psychiatric: Normal mood and affect  Laboratory Data: Results for orders placed or performed in visit on 03/15/22  BLADDER SCAN AMB NON-IMAGING  Result Value Ref Range   SCA Result 92    Catheter Removal  Patient is present today for a catheter removal.  34ml of water was drained from the balloon. A 16WF silicone foley cath was removed from the bladder no complications were noted . Patient tolerated well.  Performed by: Debroah Loop, PA-C   Assessment & Plan:   1. Urinary retention Voiding trial passed. Counseled patient to continue trospium and stay off Myrbetriq. Will plan for symptom recheck and PVR in 1 month. He is in agreement with this plan.  Return in about 4 weeks (around 04/12/2022) for Symptom recheck with PVR.  Debroah Loop, PA-C  [facility] [address], [place],  [address] [phone]

## 2022-03-18 ENCOUNTER — Ambulatory Visit: Payer: Medicare Other | Admitting: Urology

## 2022-03-23 ENCOUNTER — Ambulatory Visit: Payer: Medicare Other | Admitting: Urology

## 2022-03-25 ENCOUNTER — Ambulatory Visit: Payer: Medicare Other | Admitting: Urology

## 2022-04-14 NOTE — Progress Notes (Signed)
04/15/2022 11:03 AM   [name] October 03, 1955 [id number]  Referring provider: Frazier Richards, [place],  [place] [address]  Urological history: 1. BPH with LU TS -prostate volume 43 gram TRUS 2022 -cysto 2022 NED -I PSS 6/2 -PVR 0 mL -managed with finasteride 5 mg daily and tamsulosin 0.4 mg daily   2. Nocturia -Risk factors for nocturia: hypertension, diabetes, heart disease and BPH  3. ED -contributing factors of age, HTN, DM, BPH, heart disease, MI, HLD and former smoker -testosterone, 10/2021 - 556 -SHIM 24  4. Prostate cancer screening -PSA (03/2022) 0.2 -AUA guidelines (2023) recommend offering prostate screening to men's 50 through 68 years of age every 2 to 4 years  Benign Prostatic Hypertrophy and Follow-up   HPI: [name] is a 66 y.o. male who presents today for follow up.   At his visit on March 04, 2022, he was having nocturia every hour.  In addition to the trospium 20 mg nightly I added Myrbetriq 50 mg nightly.  He went into urinary retention required Foley catheter placement.  He then discontinued the Myrbetriq and successfully passed his voiding trial.  Today, he states he feels great.  He is still getting up at night and sometimes it is 5 times a night, but he is happy with this as he can fall right back asleep.  He is having issues with dry mouth with the CPAP machine and he feels this may be contributing to some of the nighttime awakenings.  Patient denies any modifying or aggravating factors.  Patient denies any gross hematuria, dysuria or suprapubic/flank pain.  Patient denies any fevers, chills, nausea or vomiting.    He says his erections are getting stronger and more frequent.   I PSS 6/2  PVR 0 mL    IPSS     Row Name 04/15/22 1000         International Prostate Symptom Score   How often have you had the sensation of not emptying your bladder? Not at All     How often have you had to urinate less than  every two hours? Less than 1 in 5 times     How often have you found you stopped and started again several times when you urinated? Not at All     How often have you found it difficult to postpone urination? Not at All     How often have you had a weak urinary stream? Not at All     How often have you had to strain to start urination? Not at All     How many times did you typically get up at night to urinate? 5 Times     Total IPSS Score 6       Quality of Life due to urinary symptoms   If you were to spend the rest of your life with your urinary condition just the way it is now how would you feel about that? Mostly Satisfied                 Score:  1-7 Mild 8-19 Moderate 20-35 Severe      PMH: Past Medical History:  Diagnosis Date   Arthritis    Chronic kidney disease    KIDNEY TRANSPLANT   Coronary artery disease    Diabetes mellitus without complication (HCC)    Dyspnea    GERD (gastroesophageal reflux disease)    History of blood transfusion  Hypertension    Myocardial infarction [facility])     Surgical History: Past Surgical History:  Procedure Laterality Date   CATARACT EXTRACTION W/PHACO Right 02/03/2017   Procedure: CATARACT EXTRACTION PHACO AND INTRAOCULAR LENS PLACEMENT ([facility]);  Surgeon: Leandrew Koyanagi, MD;  Location: [facility] ORS;  Service: Ophthalmology;  Laterality: Right;  [place] 00:35.8 AP% 12.8 CDE 4.57 Fluid lot # [id number]   COLONOSCOPY WITH PROPOFOL N/A 12/30/2021   Procedure: COLONOSCOPY WITH PROPOFOL;  Surgeon: Jonathon Bellows, MD;  Location: [facility] ENDOSCOPY;  Service: Gastroenterology;  Laterality: N/A;   CORONARY ANGIOPLASTY     STENTS X 4   CORONARY ARTERY BYPASS GRAFT     ENDARTERECTOMY Left 09/06/2019   Procedure: ENDARTERECTOMY CAROTID;  Surgeon: Algernon Huxley, MD;  Location: [facility] ORS;  Service: Vascular;  Laterality: Left;   EYE SURGERY Bilateral    cataract   HIP SURGERY     KIDNEY TRANSPLANT     2016    Home Medications:  Allergies as of  04/15/2022       Reactions   Oxybutynin Other (See Comments)   Incomplete bladder emptying        Medication List        Accurate as of April 15, 2022 11:03 AM. If you have any questions, ask your nurse or doctor.          amLODipine 10 MG tablet Commonly known as: NORVASC Take 10 mg by mouth daily.   ascorbic acid 500 MG tablet Commonly known as: VITAMIN C Take 500 mg by mouth daily.   aspirin EC 81 MG tablet Take 1 tablet (81 mg total) by mouth daily at 6 (six) AM.   atorvastatin 40 MG tablet Commonly known as: LIPITOR 40 mg daily at 6 PM.   carvedilol 12.5 MG tablet Commonly known as: COREG Take 12.5 mg by mouth at bedtime.   CellCept 250 MG capsule Generic drug: mycophenolate Take 250 mg by mouth 2 (two) times daily.   chlorthalidone 25 MG tablet Commonly known as: HYGROTON Take 12.5 mg by mouth every morning.   Eliquis 5 MG Tabs tablet Generic drug: apixaban Take 5 mg by mouth 2 (two) times daily.   finasteride 5 MG tablet Commonly known as: PROSCAR TAKE 1 TABLET BY MOUTH ONCE DAILY.   FreeStyle Libre 2 Sensor Misc by Does not apply route.   HumaLOG KwikPen 100 UNIT/ML KwikPen Generic drug: insulin lispro Inject 10-12 Units into the skin 3 (three) times daily. Inject 10u daily at breakfast, 10u at lunch and up to 12u at supper per SSI   Insulin Pen Needle 33G X 4 MM Misc To use with insulin pen 4 times per day. E 13.9   Lantus SoloStar 100 UNIT/ML Solostar Pen Generic drug: insulin glargine Inject 24 Units into the skin at bedtime.   linagliptin 5 MG Tabs tablet Commonly known as: TRADJENTA Take 5 mg by mouth daily.   losartan 50 MG tablet Commonly known as: COZAAR Take 50 mg by mouth daily.   omeprazole 20 MG capsule Commonly known as: PRILOSEC Take 20 mg by mouth daily as needed.   Omnipod DASH Pods (Gen 4) Misc   Prograf 1 MG capsule Generic drug: tacrolimus Take 1 mg by mouth 2 (two) times daily.   tamsulosin 0.4 MG  Caps capsule Commonly known as: FLOMAX Take 0.4 mg by mouth 2 (two) times daily.   trospium 20 MG tablet Commonly known as: SANCTURA Take 1 tablet (20 mg total) by mouth at bedtime.   Vitamin  D3 50 MCG (2000 UT) capsule Take 2,000 Units by mouth daily.        Allergies:  Allergies  Allergen Reactions   Oxybutynin Other (See Comments)    Incomplete bladder emptying    Family History: Family History  Problem Relation Age of Onset   Drug abuse Sister    Drug abuse Brother     Social History:  reports that he has quit smoking. He has never used smokeless tobacco. He reports that he does not drink alcohol and does not use drugs.  ROS: Pertinent ROS in HPI  Physical Exam: BP 107/63   Pulse 86   Ht _0  (1.727 m)   Wt 164 lb (74.4 kg)   BMI 24.94 kg/m   Constitutional:  Well nourished. Alert and oriented, No acute distress. HEENT: [place] AT, moist mucus membranes.  Trachea midline Cardiovascular: No clubbing, cyanosis, or edema. Respiratory: Normal respiratory effort, no increased work of breathing. Neurologic: Grossly intact, no focal deficits, moving all 4 extremities. Psychiatric: Normal mood and affect.   Laboratory Data: WBC 3.6 - 11.2 10*9/L 6.3   RBC 4.26 - 5.60 10*12/L 5.46   HGB 12.9 - 16.5 g/dL 16.2   HCT 39.0 - 48.0 % 48.8 High    MCV 77.6 - 95.7 fL 89.3   MCH 25.9 - 32.4 pg 29.7   MCHC 32.0 - 36.0 g/dL 33.3   RDW 12.2 - 15.2 % 13.5   MPV 6.8 - 10.7 fL 7.4   Platelet 150 - 450 10*9/L 165   nRBC <=4 /100 WBCs 0   Neutrophils % % 72.5   Lymphocytes % % 16.4   Monocytes % % 9.3   Eosinophils % % 1.3   Basophils % % 0.5   Absolute Neutrophils 1.8 - 7.8 10*9/L 4.6   Absolute Lymphocytes 1.1 - 3.6 10*9/L 1.0 Low    Absolute Monocytes 0.3 - 0.8 10*9/L 0.6   Absolute Eosinophils 0.0 - 0.5 10*9/L 0.1   Absolute Basophils 0.0 - 0.1 10*9/L 0.0   Resulting Agency  [facility]   Specimen Collected: 03/05/22 09:25   Performed by: [facility] Last Resulted: 03/05/22 09:33  Received From: [place]  Result Received: 03/08/22 08:24   Sodium 135 - 145 mmol/L 139   Potassium 3.4 - 4.8 mmol/L 3.7   Chloride 98 - 107 mmol/L 104   CO2 20.0 - 31.0 mmol/L 25.3   Anion Gap 5 - 14 mmol/L 10   BUN 9 - 23 mg/dL 20   Creatinine 0.60 - 1.10 mg/dL 1.03   BUN/Creatinine Ratio  19   eGFR CKD-EPI (2021) Male >=60 mL/min/1.60m 80   Comment: eGFR calculated with CKD-EPI 2021 equation in accordance with [employer] Neph[employer] Task Force recommendations.  Glucose 70 - 179 mg/dL 163   Calcium 8.7 - 10.4 mg/dL 9.6   Albumin 3.4 - 5.0 g/dL 4.2   Total Protein 5.7 - 8.2 g/dL 7.6   Total Bilirubin 0.3 - 1.2 mg/dL 1.7 High    AST <=34 U/L 18   ALT 10 - 49 U/L 19   Alkaline Phosphatase 46 - 116 U/L 51   [facility]ing Agency  [facility]   Specimen Collected: 03/05/22 09:25   [facility] Last Resulted: 03/05/22 09:51  Received From: [facility] Result Received: 03/08/22 08:24   Color, UA  Light Yellow   Clarity, UA  Clear   Specific Gravity, UA 1.003 -  1.030 1.010   pH, UA 5.0 - 9.0 6.5   Leukocyte Esterase, UA Negative Negative   Nitrite, UA Negative Negative   Protein, UA Negative Negative   Glucose, UA Negative Negative   Ketones, UA Negative Negative   Urobilinogen, UA <2.0 mg/dL <2.0 mg/dL   Bilirubin, UA Negative Negative   Blood, UA Negative Negative   RBC, UA <=3 /HPF <1   WBC, UA <=2 /HPF 5 High    Squam Epithel, UA 0 - 5 /HPF <1   Bacteria, UA None Seen /HPF Rare Abnormal    Sperm, UA None Seen /HPF Rare Abnormal    Hyaline Casts, UA 0 - 1 /LPF 1   Mucus, UA None Seen /HPF Rare Abnormal    Resulting Agency  [facility]   Specimen Collected: 03/05/22 09:07   Performed by: [facility] Last Resulted: 03/05/22 09:40  Received From: [place]  Result Received: 03/08/22 08:24     Prostate Specific Ag, Serum  Latest Ref Rng 0.0 - 4.0 ng/mL  08/29/2020 0.5   03/06/2021 0.2   03/04/2022 0.2   I have reviewed the labs.     Pertinent Imaging:  04/15/22 10:40  Scan Result 0      Assessment & Plan:    1. BPH with LUTS -Continue tamsulosin 0.4 mg daily and finasteride 5 mg daily  2. Nocturia -Encourage continual CPAP use -Continue trospium 20 mg nightly  3. ED -Testosterone level was normal -Improving with the use of CPAP   Return in about 1 year (around 04/16/2023) for IPSS, SHIM, PSA and exam.  These notes generated with voice recognition software. I apologize for typographical errors.  [place], [place] [address]  [place], [place] [address] [phone]

## 2022-04-15 ENCOUNTER — Ambulatory Visit: Payer: Medicare Other | Admitting: Urology

## 2022-04-15 ENCOUNTER — Other Ambulatory Visit: Payer: Self-pay

## 2022-04-15 ENCOUNTER — Encounter: Payer: Self-pay | Admitting: Urology

## 2022-04-15 VITALS — BP 107/63 | HR 86 | Ht 68.0 in | Wt 164.0 lb

## 2022-04-15 DIAGNOSIS — R339 Retention of urine, unspecified: Secondary | ICD-10-CM

## 2022-04-15 DIAGNOSIS — N401 Enlarged prostate with lower urinary tract symptoms: Secondary | ICD-10-CM

## 2022-04-15 DIAGNOSIS — R351 Nocturia: Secondary | ICD-10-CM | POA: Diagnosis not present

## 2022-04-15 DIAGNOSIS — N138 Other obstructive and reflux uropathy: Secondary | ICD-10-CM

## 2022-04-15 LAB — BLADDER SCAN AMB NON-IMAGING: Scan Result: 0

## 2022-05-20 ENCOUNTER — Ambulatory Visit: Admit: 2022-05-20 | Discharge: 2022-05-20 | Payer: MEDICARE

## 2022-05-20 DIAGNOSIS — E1122 Type 2 diabetes mellitus with diabetic chronic kidney disease: Principal | ICD-10-CM

## 2022-05-20 DIAGNOSIS — Z940 Kidney transplant status: Principal | ICD-10-CM

## 2022-05-20 DIAGNOSIS — Z23 Encounter for immunization: Principal | ICD-10-CM

## 2022-05-20 DIAGNOSIS — N185 Chronic kidney disease, stage 5: Principal | ICD-10-CM

## 2022-05-20 DIAGNOSIS — Z79899 Other long term (current) drug therapy: Principal | ICD-10-CM

## 2022-05-20 DIAGNOSIS — R8279 Other abnormal findings on microbiological examination of urine: Principal | ICD-10-CM

## 2022-05-20 DIAGNOSIS — Z794 Long term (current) use of insulin: Principal | ICD-10-CM

## 2022-05-20 DIAGNOSIS — I151 Hypertension secondary to other renal disorders: Principal | ICD-10-CM

## 2022-05-24 DIAGNOSIS — N186 End stage renal disease: Principal | ICD-10-CM

## 2022-05-24 DIAGNOSIS — Z940 Kidney transplant status: Principal | ICD-10-CM

## 2022-05-28 ENCOUNTER — Ambulatory Visit (INDEPENDENT_AMBULATORY_CARE_PROVIDER_SITE_OTHER): Payer: Medicare Other | Admitting: Vascular Surgery

## 2022-05-28 ENCOUNTER — Encounter (INDEPENDENT_AMBULATORY_CARE_PROVIDER_SITE_OTHER): Payer: Self-pay | Admitting: Vascular Surgery

## 2022-05-28 VITALS — BP 127/62 | HR 76 | Resp 16 | Wt 168.2 lb

## 2022-05-28 DIAGNOSIS — T829XXD Unspecified complication of cardiac and vascular prosthetic device, implant and graft, subsequent encounter: Secondary | ICD-10-CM

## 2022-05-28 DIAGNOSIS — E7800 Pure hypercholesterolemia, unspecified: Secondary | ICD-10-CM | POA: Diagnosis not present

## 2022-05-28 DIAGNOSIS — I6523 Occlusion and stenosis of bilateral carotid arteries: Secondary | ICD-10-CM | POA: Diagnosis not present

## 2022-05-28 DIAGNOSIS — I10 Essential (primary) hypertension: Secondary | ICD-10-CM | POA: Diagnosis not present

## 2022-05-28 DIAGNOSIS — E1159 Type 2 diabetes mellitus with other circulatory complications: Secondary | ICD-10-CM

## 2022-05-28 NOTE — Assessment & Plan Note (Signed)
His left brachiocephalic AV fistula somewhat aneurysmal.  This has not been assessed in many years.  We can do a duplex at his next visit next year.

## 2022-05-28 NOTE — Assessment & Plan Note (Signed)
lipid control important in reducing the progression of atherosclerotic disease. Continue statin therapy  

## 2022-05-28 NOTE — Assessment & Plan Note (Signed)
blood pressure control important in reducing the progression of atherosclerotic disease. On appropriate oral medications.  

## 2022-05-28 NOTE — Progress Notes (Signed)
MRN : [id number]  [name] is a 66 y.o. ([birth year]) male who presents with chief complaint of  Chief Complaint  Patient presents with   Follow-up    Patient update care   .  History of Present Illness: Patient returns in follow-up to resume his vascular care.  He is almost 2 years status post left carotid endarterectomy for high-grade stenosis.  He also has a left brachiocephalic AV fistula created about a decade ago.  Fortunately for him, he received a kidney transplant about 5 years ago and has not had to use this fistula since that time.  He used it for 6 years prior to that and never really had much of an issue with it.  It had worked well without requiring a large amount of interventions to keep it running.  He is doing well today.  He no longer smokes.  He stays hydrated and takes care of his kidney transplant very well.  Current Outpatient Medications  Medication Sig Dispense Refill   amLODipine (NORVASC) 10 MG tablet Take 10 mg by mouth daily.      ascorbic acid (VITAMIN C) 500 MG tablet Take 500 mg by mouth daily.     aspirin EC 81 MG EC tablet Take 1 tablet (81 mg total) by mouth daily at 6 (six) AM.     atorvastatin (LIPITOR) 40 MG tablet 40 mg daily at 6 PM.      carvedilol (COREG) 12.5 MG tablet Take 12.5 mg by mouth at bedtime.     CELLCEPT 250 MG capsule Take 250 mg by mouth 2 (two) times daily.     chlorthalidone (HYGROTON) 25 MG tablet Take 12.5 mg by mouth every morning.     Cholecalciferol (VITAMIN D3) 50 MCG (2000 UT) capsule Take 2,000 Units by mouth daily.      Continuous Blood Gluc Sensor (FREESTYLE LIBRE 2 SENSOR) MISC by Does not apply route.     ELIQUIS 5 MG TABS tablet Take 5 mg by mouth 2 (two) times daily.      finasteride (PROSCAR) 5 MG tablet TAKE 1 TABLET BY MOUTH ONCE DAILY. 90 tablet 0   HUMALOG KWIKPEN 100 UNIT/ML KiwkPen Inject 10-12 Units into the skin 3 (three) times daily. Inject 10u daily at breakfast, 10u at lunch and up to 12u at supper  per [facility]     Insulin Disposable Pump (OMNIPOD DASH 5 PACK PODS) MISC      Insulin Pen Needle 33G X 4 MM MISC To use with insulin pen 4 times per day. E 13.9     LANTUS SOLOSTAR 100 UNIT/ML Solostar Pen Inject 24 Units into the skin at bedtime.      linagliptin (TRADJENTA) 5 MG TABS tablet Take 5 mg by mouth daily.      losartan (COZAAR) 50 MG tablet Take 50 mg by mouth daily.     omeprazole (PRILOSEC) 20 MG capsule Take 20 mg by mouth daily as needed.      PROGRAF 1 MG capsule Take 1 mg by mouth 2 (two) times daily.      tamsulosin (FLOMAX) 0.4 MG CAPS capsule Take 0.4 mg by mouth 2 (two) times daily.      trospium (SANCTURA) 20 MG tablet Take 1 tablet (20 mg total) by mouth at bedtime. 30 tablet 3   No current facility-administered medications for this visit.    Past Medical History:  Diagnosis Date   Arthritis    Chronic kidney disease    KIDNEY  TRANSPLANT   Coronary artery disease    Diabetes mellitus without complication (HCC)    Dyspnea    GERD (gastroesophageal reflux disease)    History of blood transfusion    Hypertension    Myocardial infarction [facility])     Past Surgical History:  Procedure Laterality Date   CATARACT EXTRACTION W/PHACO Right 02/03/2017   Procedure: CATARACT EXTRACTION PHACO AND INTRAOCULAR LENS PLACEMENT ([place]);  Surgeon: Leandrew Koyanagi, MD;  Location: [facility] ORS;  Service: Ophthalmology;  Laterality: Right;  [place] 00:35.8 AP% 12.8 CDE 4.57 Fluid lot # [id number]   COLONOSCOPY WITH PROPOFOL N/A 12/30/2021   Procedure: COLONOSCOPY WITH PROPOFOL;  Surgeon: Jonathon Bellows, MD;  Location: [facility] ENDOSCOPY;  Service: Gastroenterology;  Laterality: N/A;   CORONARY ANGIOPLASTY     STENTS X 4   CORONARY ARTERY BYPASS GRAFT     ENDARTERECTOMY Left 09/06/2019   Procedure: ENDARTERECTOMY CAROTID;  Surgeon: Algernon Huxley, MD;  Location: [facility] ORS;  Service: Vascular;  Laterality: Left;   EYE SURGERY Bilateral    cataract   HIP SURGERY     KIDNEY TRANSPLANT     2016      Social History   Tobacco Use   Smoking status: Former   Smokeless tobacco: Never   Tobacco comments:    30 years ago  Vaping Use   Vaping Use: Never used  Substance Use Topics   Alcohol use: No    Alcohol/week: 0.0 standard drinks of alcohol   Drug use: Never      Family History  Problem Relation Age of Onset   Drug abuse Sister    Drug abuse Brother   No bleeding or clotting disorders  Allergies  Allergen Reactions   Oxybutynin Other (See Comments)    Incomplete bladder emptying    REVIEW OF SYSTEMS (Negative unless checked)   Constitutional: [] Weight loss  [] Fever  [] Chills Cardiac: [] Chest pain   [] Chest pressure   [] Palpitations   [] Shortness of breath when laying flat   [] Shortness of breath at rest   [x] Shortness of breath with exertion. Vascular:  [] Pain in legs with walking   [] Pain in legs at rest   [] Pain in legs when laying flat   [] Claudication   [] Pain in feet when walking  [] Pain in feet at rest  [] Pain in feet when laying flat   [] History of DVT   [] Phlebitis   [] Swelling in legs   [] Varicose veins   [] Non-healing ulcers Pulmonary:   [] Uses home oxygen   [] Productive cough   [] Hemoptysis   [] Wheeze  [] COPD   [] Asthma Neurologic:  [] Dizziness  [] Blackouts   [] Seizures   [] History of stroke   [] History of TIA  [] Aphasia   [] Temporary blindness   [] Dysphagia   [] Weakness or numbness in arms   [] Weakness or numbness in legs Musculoskeletal:  [x] Arthritis   [] Joint swelling   [] Joint pain   [] Low back pain Hematologic:  [] Easy bruising  [] Easy bleeding   [] Hypercoagulable state   [] Anemic   Gastrointestinal:  [] Blood in stool   [] Vomiting blood  [] Gastroesophageal reflux/heartburn   [] Abdominal pain Genitourinary:  [x] Chronic kidney disease   [] Difficult urination  [] Frequent urination  [] Burning with urination   [] Hematuria Skin:  [] Rashes   [] Ulcers   [] Wounds Psychological:  [] History of anxiety   []  History of major depression.   Physical  Examination  Vitals:   05/28/22 0900  BP: 127/62  Pulse: 76  Resp: 16  Weight: 168 lb 3.2 oz (76.3 kg)  Body mass index is 25.57 kg/m. Gen:  WD/WN, NAD Head: [place]/AT, No temporalis wasting. Ear/Nose/Throat: Hearing grossly intact, nares w/o erythema or drainage, trachea midline Eyes: Conjunctiva clear. Sclera non-icteric Neck: Supple.  Well healed left CEA scar. No bruit  Pulmonary:  Good air movement, equal and clear to auscultation bilaterally.  Cardiac: RRR, No JVD Vascular: Left brachiocephalic AVF with good thrill Vessel Right Left  Radial Palpable Palpable           Musculoskeletal: M/S 5/5 throughout.  No deformity or atrophy. No edema. Neurologic: CN 2-12 intact. Sensation grossly intact in extremities.  Symmetrical.  Speech is fluent. Motor exam as listed above. Psychiatric: Judgment intact, Mood & affect appropriate for pt's clinical situation. Dermatologic: No rashes or ulcers noted.  No cellulitis or open wounds.     CBC Lab Results  Component Value Date   WBC 6.5 05/22/2020   HGB 12.3 (L) 05/22/2020   HCT 35.5 (L) 05/22/2020   MCV 86.4 05/22/2020   PLT 169 05/22/2020    BMET    Component Value Date/Time   NA 132 (L) 05/22/2020 0515   K 3.2 (L) 05/22/2020 0515   K 4.8 08/29/2012 0954   CL 97 (L) 05/22/2020 0515   CO2 24 05/22/2020 0515   GLUCOSE 326 (H) 05/22/2020 0515   BUN 19 05/22/2020 0515   CREATININE 1.03 05/22/2020 0515   CALCIUM 8.2 (L) 05/22/2020 0515   CALCIUM 8.0 (L) 11/11/2010 2120   GFRNONAA >60 05/22/2020 0515   GFRAA >60 04/29/2020 0337   CrCl cannot be calculated (Patient's most recent lab result is older than the maximum 21 days allowed.).  COAG Lab Results  Component Value Date   INR 1.7 (H) 05/21/2020   INR 1.4 (H) 05/19/2020   INR 1.5 (H) 04/27/2020    Radiology No results found.   Assessment/Plan Carotid artery stenosis Almost 2 years status post left carotid endarterectomy.  It sounds like his cardiologist has  checked at some point following surgery but this has not been checked recently.  He will return next year with a carotid duplex and we will be happy to follow this going forward.  Essential (primary) hypertension blood pressure control important in reducing the progression of atherosclerotic disease. On appropriate oral medications.   Diabetes ([place]) blood glucose control important in reducing the progression of atherosclerotic disease. Also, involved in wound healing. On appropriate medications.   Hypercholesteremia lipid control important in reducing the progression of atherosclerotic disease. 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  This has not been assessed in many years.  We can do a duplex at his next visit next year.    Leotis Pain, MD  05/28/2022 9:55 AM    This note was created with Dragon medical transcription system.  Any errors from dictation are purely unintentional

## 2022-05-28 NOTE — Assessment & Plan Note (Signed)
blood glucose control important in reducing the progression of atherosclerotic disease. Also, involved in wound healing. On appropriate medications.  

## 2022-05-28 NOTE — Assessment & Plan Note (Signed)
>>  ASSESSMENT AND PLAN FOR DIABETES (HCC) WRITTEN ON 05/28/2022  9:54 AM BY DEW, Donald Frost, MD  blood glucose control important in reducing the progression of atherosclerotic disease. Also, involved in wound healing. On appropriate medications.

## 2022-05-28 NOTE — Assessment & Plan Note (Signed)
Almost 2 years status post left carotid endarterectomy.  It sounds like his cardiologist has checked at some point following surgery but this has not been checked recently.  He will return next year with a carotid duplex and we will be happy to follow this going forward.

## 2022-05-31 ENCOUNTER — Encounter (INDEPENDENT_AMBULATORY_CARE_PROVIDER_SITE_OTHER): Payer: Self-pay

## 2022-06-15 ENCOUNTER — Ambulatory Visit: Payer: Medicare Other | Admitting: Podiatry

## 2022-06-15 DIAGNOSIS — E119 Type 2 diabetes mellitus without complications: Secondary | ICD-10-CM

## 2022-06-15 MED ORDER — SILVER SULFADIAZINE 1 % EX CREA: 1.0000 | TOPICAL_CREAM | Freq: Every day | CUTANEOUS | 0 refills | Status: DC

## 2022-06-15 NOTE — Progress Notes (Signed)
   Chief Complaint  Patient presents with   foot care    Patient is here for diabetic foot care, patient states that he needs new diabetic shoes.    HPI: 66 y.o. male PMHx diabetes mellitus presenting today to reestablish care with our practice.  He was last seen 2019.  He is also requesting new diabetic shoes.  He does have a blister that developed a few weeks ago to the dorsum of the right foot.  Presents for further treatment and evaluation  Past Medical History:  Diagnosis Date   Arthritis    Chronic kidney disease    KIDNEY TRANSPLANT   Coronary artery disease    Diabetes mellitus without complication (HCC)    Dyspnea    GERD (gastroesophageal reflux disease)    History of blood transfusion    Hypertension    Myocardial infarction [facility])     Past Surgical History:  Procedure Laterality Date   CATARACT EXTRACTION W/PHACO Right 02/03/2017   Procedure: CATARACT EXTRACTION PHACO AND INTRAOCULAR LENS PLACEMENT ([place]);  Surgeon: Leandrew Koyanagi, MD;  Location: [facility] ORS;  Service: Ophthalmology;  Laterality: Right;  [place] 00:35.8 AP% 12.8 CDE 4.57 Fluid lot # [id number] H   COLONOSCOPY WITH PROPOFOL N/A 12/30/2021   Procedure: COLONOSCOPY WITH PROPOFOL;  Surgeon: Jonathon Bellows, MD;  Location: [facility] ENDOSCOPY;  Service: Gastroenterology;  Laterality: N/A;   CORONARY ANGIOPLASTY     STENTS X 4   CORONARY ARTERY BYPASS GRAFT     ENDARTERECTOMY Left 09/06/2019   Procedure: ENDARTERECTOMY CAROTID;  Surgeon: Algernon Huxley, MD;  Location: [facility] ORS;  Service: Vascular;  Laterality: Left;   EYE SURGERY Bilateral    cataract   HIP SURGERY     KIDNEY TRANSPLANT     2016    Allergies  Allergen Reactions   Oxybutynin Other (See Comments)    Incomplete bladder emptying     Physical Exam: General: The patient is alert and oriented x3 in no acute distress.  Dermatology: Skin is warm, dry and supple bilateral lower extremities.  Superficial partial-thickness ulcer noted to the base of the toes  right foot measuring approximately 1 cm in diameter.  This appears very stable.  No drainage.  No erythema.  Vascular: Palpable pedal pulses bilaterally. Capillary refill within normal limits.  Negative for any significant edema or erythema  Neurological: Light touch and protective threshold grossly intact  Musculoskeletal Exam: No pedal deformities noted  Assessment: 1.  Encounter for diabetic foot exam 2.  Superficial ulcer right foot   Plan of Care:  1. Patient evaluated.  Comprehensive diabetic foot exam performed today 2.  Prescription for Silvadene cream apply daily with a light dressing 3.  Appointment with orthotics department for new diabetic shoes and insoles 4.  Return to clinic annually or sooner as needed     Edrick Kins, DPM [facility]  Dr. Edrick Kins, DPM    [address], [place] [address]                Office [phone]  Fax [phone]

## 2022-08-11 ENCOUNTER — Ambulatory Visit (INDEPENDENT_AMBULATORY_CARE_PROVIDER_SITE_OTHER): Payer: Medicare Other | Admitting: *Deleted

## 2022-08-11 DIAGNOSIS — L97511 Non-pressure chronic ulcer of other part of right foot limited to breakdown of skin: Secondary | ICD-10-CM

## 2022-08-11 DIAGNOSIS — E1142 Type 2 diabetes mellitus with diabetic polyneuropathy: Secondary | ICD-10-CM

## 2022-08-11 NOTE — Progress Notes (Signed)
Patient presents to the office today for diabetic shoe and insole measuring.  Patient was measured with brannock device to determine size and width for 1 pair of extra depth shoes and foam casted for 3 pair of insoles.   Documentation of medical necessity will be sent to patient's treating diabetic doctor to verify and sign.   Patient's diabetic provider: Dr. Felipa Evener Solum  Shoes and insoles will be ordered at that time and patient will be notified for an appointment for fitting when they arrive.   Shoe size (per patient): 8.5-9   Brannock measurement: RIGHT/LEFT: 9C  Patient shoe selection-   Shoe choice: [employer] Balance [id number]  Shoe size ordered: Men's 9 Wide  *Shoe available in D width only, ordered thicker insoles to accommodate extra space

## 2022-09-06 ENCOUNTER — Encounter: Payer: Self-pay | Admitting: Podiatry

## 2022-09-09 ENCOUNTER — Encounter: Payer: Self-pay | Admitting: Podiatry

## 2022-09-16 ENCOUNTER — Other Ambulatory Visit: Payer: Self-pay | Admitting: Cardiovascular Disease

## 2022-09-16 DIAGNOSIS — I509 Heart failure, unspecified: Secondary | ICD-10-CM

## 2022-11-01 DIAGNOSIS — Z940 Kidney transplant status: Principal | ICD-10-CM

## 2022-11-01 MED ORDER — CELLCEPT 250 MG CAPSULE
ORAL_CAPSULE | Freq: Two times a day (BID) | ORAL | 3 refills | 90 days | Status: CP
Start: 2022-11-01 — End: 2023-11-01

## 2022-11-03 DIAGNOSIS — Z940 Kidney transplant status: Principal | ICD-10-CM

## 2022-11-04 DIAGNOSIS — Z940 Kidney transplant status: Principal | ICD-10-CM

## 2022-11-04 MED ORDER — MYCOPHENOLATE MOFETIL 250 MG CAPSULE
ORAL_CAPSULE | Freq: Two times a day (BID) | ORAL | 3 refills | 90 days | Status: CP
Start: 2022-11-04 — End: 2023-11-04

## 2022-11-08 DIAGNOSIS — Z940 Kidney transplant status: Principal | ICD-10-CM

## 2022-11-08 NOTE — Unmapped (Signed)
Specialty Medication(s): mycophenolate    Steven Lynn has been dis-enrolled from the [facility] specialty pharmacy services due to a pharmacy change. The patient is now filling at [facility] .    Additional information provided to the patient: verified per patient and daughter that he does NOT [facility] this [facility] at [facility] ([facility] in July 2023), this should always go to [facility]. Disenrolling patient- he knows to contact us with any future needs. Also messaged clinic to resend rx to [facility]    Thad Ranger, PharmD  [facility] Specialty Pharmacist

## 2022-11-08 NOTE — Unmapped (Signed)
4/8: patient and daughter verified that this rx comes from [facility], not [facility]. Messaging clinic to send to correct pharmacy per patient request -ef    The following medication is onboarded in this note:  Mycophenolate  $37.61 / 90 days not part b    [facility]   Patient Onboarding/Medication Counseling    Steven Lynn is a 67 y.o. male with kidney transplant who I am counseling today on continuation of therapy.  I am speaking to  patient and daughter .    Was a Nurse, learning disability used for this call? No    Verified patient's date of birth / HIPAA.    Specialty medication(s) to be sent: n/a      Non-specialty medications/supplies to be sent: n/a      Medications not needed at this time: n/a         The patient declined counseling on medication administration, missed dose instructions, goals of therapy, side effects and monitoring parameters, warnings and precautions, drug/food interactions, and storage, handling precautions, and disposal because they have taken the medication previously. The information in the declined sections below are for informational purposes only and was not discussed with patient.     Cellcept (mycopheonlate mofetil)    Medication & Administration     Dosage: Take 3 capsules (750mg  total) by mouth twice daily    Administration:   Take by mouth with or without food.   Taking with food can minimize GI side effects.   Swallow capsules whole, do not crush or chew.  Oral suspension should be shaken well prior to administration.  Do not mix with other medications and discard any unused portion 60 days after constitution.      Adherence/Missed dose instructions:  Take a missed dose as soon as you remember it . If it is close to the time of your next dose, skip the missed dose and resume your normal schedule.Never take 2 doses to try and catch up from a missed dose.    Goals of Therapy     Prevent organ rejection    Side Effects & Monitoring Parameters     Feeling tired or weak  Shakiness  Trouble sleeping  Diarrhea, abdominal pain, nausea, vomiting, constipation or decreased appetite  Decreases in blood counts   Back or joint pain  Hypertension or hypotension  High blood sugar  Headache  Skin rash    The following side effects should be reported to the provider:  Reduced immune function - report signs of infection such as fever; chills; body aches; very bad sore throat; ear or sinus pain; cough; more sputum or change in color of sputum; pain with passing urine; wound that will not heal, etc.  Also at a slightly higher risk of some malignancies (mainly skin and blood cancers) due to this reduced immune function.  Allergic reaction (rash, hives, swelling, shortness of breath)  High blood sugar (confusion, feeling sleepy, more thirst, more hungry, passing urine more often, flushing, fast breathing, or breath that smells like fruit)  Electrolyte issues (mood changes, confusion, muscle pain or weakness, a heartbeat that does not feel normal, seizures, not hungry, or very bad upset stomach or throwing up)  High or low blood pressure (bad headache or dizziness, passing out, or change in eyesight)  Kidney issues (unable to pass urine, change in how much urine is passed, blood in the urine, or a big weight gain)  Skin (oozing, heat, swelling, redness, or pain), UTI and other infections  Chest pain or pressure  Abnormal heartbeat  Unexplained bleeding or bruising  Abnormal burning, numbness, or tingling  Muscle cramps,  Yellowing of skin or eyes    Monitoring parameters  Pregnancy   CBC   Renal and hepatic function    Contraindications, Warnings, & Precautions     *This is a REMS drug and an FDA-approved patient medication guide will be printed with each dispensation  Black Box Warning: Infections   Black Box Warning: Lymphoproliferative disorders - risk of development of lymphoma and skin malignancy is increased  Black Box Warning: Use during pregnancy is associated with increased risks of first trimester pregnancy loss and congenital malformations.   Black Box Warning: Females of reproductive potential should use contraception during treatment and for 6 weeks after therapy is discontinued  Is patient using an effective method of contraception? Not Applicable  If yes, method of contraception:  patient is male  CNS depression  New or reactivated viral infections  Neutropenia  Male patients and/or their male partners should use effective contraception during treatment of the male patient and for at least 3 months after last dose.  Breastfeeding is not recommended during therapy and for 6 weeks after last dose    Drug/Food Interactions     Medication list reviewed in Epic. The patient was instructed to inform the care team before taking any new medications or supplements.  No interactions noted that clinic is not already monitoring .   Separate doses of antacids and this medication  Check with your doctor before getting any vaccinations    Storage, Handling Precautions, & Disposal     Store at room temperature in a dry place  This medication is considered hazardous. Wash hands after handling and store out of reach or others, including children and pets.      Current Medications (including OTC/herbals), Comorbidities and Allergies     Current Outpatient Medications   Medication Sig Dispense Refill    amLODIPine (NORVASC) 10 MG tablet Take 1 tablet (10 mg total) by mouth daily. 90 tablet 3    apixaban (ELIQUIS) 5 mg Tab Take 1 tablet (5 mg total) by mouth Two (2) times a day.      ascorbic acid, vitamin C, (VITAMIN C) 500 MG tablet Take 1 tablet (500 mg total) by mouth daily.      aspirin (ECOTRIN) 81 MG tablet TAKE ONE (1) TABLET BY MOUTH EVERY DAY 90 tablet 4    atorvastatin (LIPITOR) 40 MG tablet TOME 1 TABLETA POR LA BOCA  DIARIAMENTE CON LA COMIDA  DE LA TARDE 90 tablet 4    blood sugar diagnostic Strp ge glucose test strips to check bs 4 times per day. E 13.9 120 strip 6    carvedilol (COREG) 3.125 MG tablet Take 1 tablet (3.125 mg total) by mouth Two (2) times a day. (Patient taking differently: Take 2 tablets (6.25 mg total) by mouth Two (2) times a day.) 180 tablet 3    chlorthalidone (HYGROTON) 25 MG tablet Take 0.5 tablets (12.5 mg total) by mouth every morning.      cholecalciferol, vitamin D3, 2,000 unit cap Take 2 capsules (4,000 Units total) by mouth daily. 30 capsule 11    finasteride (PROSCAR) 5 mg tablet Take 1 tablet (5 mg total) by mouth daily.      flash glucose sensor (FREESTYLE LIBRE 14 DAY SENSOR) kit by Miscellaneous route.      insulin glargine (LANTUS SOLOSTAR) 100 unit/mL (3 mL) injection pen Inject 0.24 mL (  24 Units total) under the skin nightly. DM E11.9 10 pen 1    insulin lispro (HUMALOG KWIKPEN INSULIN) 100 unit/mL injection pen Inject 10 units at breakfast, 10 units at lunch, and SS scale units at dinner. Inject 0-12 units based on sliding scale for BG > 150. 5 pen 5    lancets Misc Use to check blood sugars 4 times per day. 200 each 11    linaGLIPtin (TRADJENTA) 5 mg Tab Take 0.5 tablets (2.5 mg total) by mouth daily. 90 tablet 3    losartan (COZAAR) 50 MG tablet TOME 1 TABLETA POR LA BOCA  DIARIAMENTE 90 tablet 3    mycophenolate (CELLCEPT) 250 mg capsule Take 3 capsules (750 mg total) by mouth two (2) times a day. 540 capsule 3    omeprazole (PRILOSEC) 20 MG capsule Take 1 capsule (20 mg total) by mouth daily.      OMNIPOD 5 PACK PODS       pen needle, diabetic (ADVOCATE PEN NEEDLE) 33 gauge x 5/32 Ndle To use with insulin pen 4 times per day. E 13.9 120 each 6    pen needle, diabetic (PEN NEEDLE) 31 gauge x 1/4 Ndle E11.9; check blood glucose TID AC 100 each 5    tacrolimus (PROGRAF) 1 MG capsule Take 1 capsule (1 mg total) by mouth Two (2) times a day.      tamsulosin (FLOMAX) 0.4 mg capsule Take 2 capsules (0.8 mg total) by mouth daily.      trospium (SANCTURA) 20 mg tablet Take 1 tablet (20 mg total) by mouth Two (2) times a day.       No current facility-administered medications for this visit.       Allergies   Allergen Reactions    Oxybutynin Other (See Comments)     Incomplete bladder emptying       Patient Active Problem List   Diagnosis    Coronary atherosclerosis of native coronary artery    Type II diabetes mellitus (CMS-HCC)    Essential hypertension    Diabetes mellitus (CMS-HCC)    Coronary artery disease    Vitamin D deficiency    Hypertension    Hypercholesteremia    Pulmonary scarring    Kidney 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/10/2015    Paroxysmal atrial fibrillation (CMS-HCC)    Immunosuppression (CMS-HCC)    Aftercare following organ transplant       Reviewed and up to date in Epic.    Appropriateness of Therapy     Acute infections noted within Epic:  No active infections  Patient reported infection: None    Is medication and dose appropriate based on diagnosis and infection status? Yes    Prescription has been clinically reviewed: Yes      Baseline Quality of Life Assessment      How many days over the past month did your transplant  keep you from your normal activities? For example, brushing your teeth or getting up in the morning. 0    Financial Information     Medication Assistance provided: None Required    Anticipated copay of $ $37.61 / 90 days  reviewed with patient. Verified delivery address.    Delivery Information     Scheduled delivery date: n/a- [facility] not fill at [facility]    Expected start date: patient is already taking    Patient was notified of new phone menu: Yes    Medication will be delivered via  n/a  to the  n/a  address in Epic  [facility].  This shipment will not require a signature.      Explained the services we provide at [facility] and that each month we would call to set up refills.  Stressed importance of returning phone calls so that we could ensure they receive their medications in time each month.  Informed patient that we should be setting up refills 7-10 days prior to when they will run out of medication.  A pharmacist will reach out to perform a clinical assessment periodically.  Informed patient that a welcome packet, containing information about our pharmacy and other support services, a Notice of Privacy Practices, and a drug information handout will be sent.      The patient or caregiver noted above participated in the development of this care plan and knows that they can request review of or adjustments to the care plan at any time.      Patient or caregiver verbalized understanding of the above information as well as how to contact the pharmacy at [phone] option 4 with any questions/concerns.  The pharmacy is open Monday through Friday 8:30am-4:30pm.  A pharmacist is available 24/7 via pager to answer any clinical questions they may have.    Patient Specific Needs     Does the patient have any physical, cognitive, or cultural barriers? No    Does the patient have adequate living arrangements? (i.e. the ability to store and take their medication appropriately) Yes    Did you identify any home environmental safety or security hazards? No    Patient prefers to have medications discussed with   patient or daughter      Is the patient or caregiver able to read and understand education materials at a high school level or above? Yes    Patient's primary language is   spanish but declined interpreter as patient and daughter both spoke in english    Is the patient high risk? Yes, patient is taking a REMS drug. Medication is dispensed in compliance with REMS program    SOCIAL DETERMINANTS OF HEALTH     At the [facility], we have learned that life circumstances - like trouble affording food, housing, utilities, or transportation can affect the health of many of our patients.   That is why we wanted to ask: are you currently experiencing any life circumstances that are negatively impacting your health and/or quality of life? No    Social Determinants of Psychologist, prison and probation services Strain: Not on file   Internet Connectivity: Not on file Food Insecurity: Not on file   Tobacco Use: Medium Risk (06/07/2022)    Received from [facility]    Patient History     Smoking Tobacco Use: Former     Smokeless Tobacco Use: Never     Passive Exposure: Not on file   Housing/Utilities: Not on file   Alcohol Use: Not on file   Transportation Needs: Not on file   Substance Use: Not on file   Health Literacy: Not on file   Physical Activity: Not on file   Interpersonal Safety: Not on file   Stress: Not on file   Intimate Partner Violence: Not on file   Depression: Not on file   Social Connections: Not on file       Would you be willing to receive help with any of the needs that you have identified today? Not applicable       Thad Ranger, PharmD  [facility] Specialty Pharmacist

## 2022-11-08 NOTE — Unmapped (Signed)
[facility] Specialty Medication Onboarding    Specialty Medication: Mycophenolate 250mg  capsule  Prior Authorization: Not Required   Financial Assistance: No - copay  <$25  Final Copay/Day Supply: $37.61 / 90 days    Insurance Restrictions: None     Notes to Pharmacist: N/A    The triage team has completed the benefits investigation and has determined that the patient is able to fill this medication at [facility]. Please contact the patient to complete the onboarding or follow up with the prescribing physician as needed.

## 2022-11-10 DIAGNOSIS — Z940 Kidney transplant status: Principal | ICD-10-CM

## 2022-11-10 MED ORDER — MYCOPHENOLATE MOFETIL 250 MG CAPSULE
ORAL_CAPSULE | Freq: Two times a day (BID) | ORAL | 3 refills | 90 days | Status: CP
Start: 2022-11-10 — End: 2023-11-10

## 2022-11-24 ENCOUNTER — Other Ambulatory Visit: Payer: Self-pay | Admitting: Orthopedic Surgery

## 2022-11-24 DIAGNOSIS — M4807 Spinal stenosis, lumbosacral region: Secondary | ICD-10-CM

## 2022-11-26 ENCOUNTER — Ambulatory Visit
Admission: RE | Admit: 2022-11-26 | Discharge: 2022-11-26 | Disposition: A | Discharge status: Home / Self Care | Payer: Medicare Other | Source: Ambulatory Visit | Attending: Orthopedic Surgery | Admitting: Orthopedic Surgery

## 2022-11-26 DIAGNOSIS — M4807 Spinal stenosis, lumbosacral region: Secondary | ICD-10-CM | POA: Diagnosis not present

## 2022-12-14 DIAGNOSIS — R799 Abnormal finding of blood chemistry, unspecified: Principal | ICD-10-CM

## 2022-12-14 DIAGNOSIS — Z940 Kidney transplant status: Principal | ICD-10-CM

## 2022-12-14 DIAGNOSIS — Z79899 Other long term (current) drug therapy: Principal | ICD-10-CM

## 2022-12-16 ENCOUNTER — Ambulatory Visit: Admit: 2022-12-16 | Discharge: 2022-12-16 | Payer: MEDICARE

## 2022-12-16 DIAGNOSIS — R799 Abnormal finding of blood chemistry, unspecified: Principal | ICD-10-CM

## 2022-12-16 DIAGNOSIS — I2510 Atherosclerotic heart disease of native coronary artery without angina pectoris: Principal | ICD-10-CM

## 2022-12-16 DIAGNOSIS — Z940 Kidney transplant status: Principal | ICD-10-CM

## 2022-12-16 DIAGNOSIS — Z79899 Other long term (current) drug therapy: Principal | ICD-10-CM

## 2022-12-16 DIAGNOSIS — I151 Hypertension secondary to other renal disorders: Principal | ICD-10-CM

## 2022-12-16 MED ORDER — CHOLECALCIFEROL (VITAMIN D3) 50 MCG (2,000 UNIT) CAPSULE
ORAL_CAPSULE | Freq: Every day | ORAL | 3 refills | 90 days | Status: CP
Start: 2022-12-16 — End: 2023-12-16

## 2022-12-16 MED ORDER — LOSARTAN 50 MG TABLET
ORAL_TABLET | Freq: Two times a day (BID) | ORAL | 3 refills | 90 days | Status: CP
Start: 2022-12-16 — End: 2023-12-16

## 2022-12-16 MED ORDER — TAMSULOSIN 0.4 MG CAPSULE
ORAL_CAPSULE | Freq: Two times a day (BID) | ORAL | 3 refills | 90 days | Status: CP
Start: 2022-12-16 — End: 2023-12-16

## 2022-12-16 MED ORDER — CARVEDILOL 6.25 MG TABLET
ORAL_TABLET | Freq: Two times a day (BID) | ORAL | 3 refills | 90 days | Status: CP
Start: 2022-12-16 — End: 2023-12-16

## 2022-12-16 MED ORDER — MYCOPHENOLATE MOFETIL 250 MG CAPSULE
ORAL_CAPSULE | Freq: Two times a day (BID) | ORAL | 3 refills | 90 days | Status: CP
Start: 2022-12-16 — End: 2023-12-16

## 2022-12-17 DIAGNOSIS — Z940 Kidney transplant status: Principal | ICD-10-CM

## 2022-12-17 MED ORDER — TACROLIMUS 1 MG CAPSULE, IMMEDIATE-RELEASE
ORAL_CAPSULE | Freq: Every day | ORAL | 3 refills | 90 days | Status: CP
Start: 2022-12-17 — End: 2023-12-17

## 2022-12-17 MED ORDER — TACROLIMUS 0.5 MG CAPSULE, IMMEDIATE-RELEASE
ORAL_CAPSULE | Freq: Every evening | ORAL | 3 refills | 90 days | Status: CP
Start: 2022-12-17 — End: 2023-12-17

## 2022-12-20 DIAGNOSIS — Z940 Kidney transplant status: Principal | ICD-10-CM

## 2022-12-27 ENCOUNTER — Ambulatory Visit (INDEPENDENT_AMBULATORY_CARE_PROVIDER_SITE_OTHER): Payer: Medicare Other

## 2022-12-27 DIAGNOSIS — I509 Heart failure, unspecified: Secondary | ICD-10-CM

## 2022-12-27 DIAGNOSIS — I351 Nonrheumatic aortic (valve) insufficiency: Secondary | ICD-10-CM

## 2022-12-27 DIAGNOSIS — I422 Other hypertrophic cardiomyopathy: Secondary | ICD-10-CM | POA: Diagnosis not present

## 2022-12-30 ENCOUNTER — Encounter: Payer: Self-pay | Admitting: Cardiovascular Disease

## 2022-12-30 ENCOUNTER — Ambulatory Visit (INDEPENDENT_AMBULATORY_CARE_PROVIDER_SITE_OTHER): Payer: Medicare Other | Admitting: Cardiovascular Disease

## 2022-12-30 ENCOUNTER — Ambulatory Visit: Admit: 2022-12-30 | Discharge: 2022-12-31 | Payer: MEDICARE

## 2022-12-30 VITALS — BP 138/86 | HR 86 | Ht 68.0 in | Wt 163.2 lb

## 2022-12-30 DIAGNOSIS — R0789 Other chest pain: Secondary | ICD-10-CM

## 2022-12-30 DIAGNOSIS — I6523 Occlusion and stenosis of bilateral carotid arteries: Secondary | ICD-10-CM | POA: Diagnosis not present

## 2022-12-30 DIAGNOSIS — I25810 Atherosclerosis of coronary artery bypass graft(s) without angina pectoris: Secondary | ICD-10-CM | POA: Diagnosis not present

## 2022-12-30 DIAGNOSIS — I10 Essential (primary) hypertension: Secondary | ICD-10-CM | POA: Diagnosis not present

## 2022-12-30 DIAGNOSIS — Z48298 Encounter for aftercare following other organ transplant: Secondary | ICD-10-CM

## 2022-12-30 DIAGNOSIS — I480 Paroxysmal atrial fibrillation: Secondary | ICD-10-CM | POA: Diagnosis not present

## 2022-12-30 NOTE — Assessment & Plan Note (Signed)
Has recurrent atrial fib with VR95/min, and chest pain

## 2022-12-30 NOTE — Progress Notes (Signed)
Cardiology Office Note   Date:  12/30/2022   ID:  Reiley, Curtain Jan 03, 1956, MRN [id number]  PCP:  Abram Sander, MD  Cardiologist:  Adrian Blackwater, MD      History of Present Illness: [name] is a 66 y.o. male who presents for  Chief Complaint  Patient presents with   Follow-up    4 month follow up, Echo results.    Feels occasional chest pain on left side while lying at night las 2-3 hours.  Chest Pain  This is a new problem. The current episode started 1 to 4 weeks ago. The onset quality is gradual. The pain is at a severity of 4/10. The quality of the pain is described as dull.      Past Medical History:  Diagnosis Date   Arthritis    Chronic kidney disease    KIDNEY TRANSPLANT   Coronary artery disease    Diabetes mellitus without complication (HCC)    Dyspnea    GERD (gastroesophageal reflux disease)    History of blood transfusion    Hypertension    Myocardial infarction [facility])      Past Surgical History:  Procedure Laterality Date   CATARACT EXTRACTION W/PHACO Right 02/03/2017   Procedure: CATARACT EXTRACTION PHACO AND INTRAOCULAR LENS PLACEMENT (IOC);  Surgeon: Lockie Mola, MD;  Location: [facility] ORS;  Service: Ophthalmology;  Laterality: Right;  [place] 00:35.8 AP% 12.8 CDE 4.57 Fluid lot # [id number]   COLONOSCOPY WITH PROPOFOL N/A 12/30/2021   Procedure: COLONOSCOPY WITH PROPOFOL;  Surgeon: Wyline Mood, MD;  Location: [facility] ENDOSCOPY;  Service: Gastroenterology;  Laterality: N/A;   CORONARY ANGIOPLASTY     STENTS X 4   CORONARY ARTERY BYPASS GRAFT     ENDARTERECTOMY Left 09/06/2019   Procedure: ENDARTERECTOMY CAROTID;  Surgeon: Annice Needy, MD;  Location: [facility] ORS;  Service: Vascular;  Laterality: Left;   EYE SURGERY Bilateral    cataract   HIP SURGERY     KIDNEY TRANSPLANT     2016     Current Outpatient Medications  Medication Sig Dispense Refill   ascorbic acid (VITAMIN C) 500 MG tablet Take 500 mg by mouth daily.      aspirin EC 81 MG EC tablet Take 1 tablet (81 mg total) by mouth daily at 6 (six) AM.     atorvastatin (LIPITOR) 40 MG tablet 40 mg daily at 6 PM.      carvedilol (COREG) 6.25 MG tablet Take 6.25 mg by mouth 2 (two) times daily with a meal.     CELLCEPT 250 MG capsule Take 500 mg by mouth 2 (two) times daily.     chlorthalidone (HYGROTON) 25 MG tablet Take 25 mg by mouth every morning.     Cholecalciferol (VITAMIN D3) 50 MCG (2000 UT) capsule Take 2,000 Units by mouth daily.      Continuous Blood Gluc Sensor (FREESTYLE LIBRE 2 SENSOR) MISC by Does not apply route.     ELIQUIS 5 MG TABS tablet Take 5 mg by mouth 2 (two) times daily.      finasteride (PROSCAR) 5 MG tablet TAKE 1 TABLET BY MOUTH ONCE DAILY. 90 tablet 0   HUMALOG KWIKPEN 100 UNIT/ML KiwkPen Inject 10-12 Units into the skin 3 (three) times daily. Inject 10u daily at breakfast, 10u at lunch and up to 12u at supper per SSI     Insulin Disposable Pump (OMNIPOD DASH 5 PACK PODS) MISC      Insulin Pen Needle 33G  X 4 MM MISC To use with insulin pen 4 times per day. E 13.9     LANTUS SOLOSTAR 100 UNIT/ML Solostar Pen Inject 24 Units into the skin at bedtime.      losartan (COZAAR) 50 MG tablet Take 50 mg by mouth in the morning and at bedtime.     omeprazole (PRILOSEC) 20 MG capsule Take 20 mg by mouth daily as needed.      PROGRAF 1 MG capsule Take 0.5 mg by mouth at bedtime.     tamsulosin (FLOMAX) 0.4 MG CAPS capsule Take 0.4 mg by mouth 2 (two) times daily.      trospium (SANCTURA) 20 MG tablet Take 1 tablet (20 mg total) by mouth at bedtime. (Patient taking differently: Take 20 mg by mouth in the morning and at bedtime.) 30 tablet 3   amLODipine (NORVASC) 10 MG tablet Take 10 mg by mouth daily.  (Patient not taking: Reported on 12/30/2022)     carvedilol (COREG) 12.5 MG tablet Take 12.5 mg by mouth at bedtime. (Patient not taking: Reported on 12/30/2022)     linagliptin (TRADJENTA) 5 MG TABS tablet Take 5 mg by mouth daily.  (Patient not  taking: Reported on 12/30/2022)     silver sulfADIAZINE (SILVADENE) 1 % cream Apply 1 Application topically daily. (Patient not taking: Reported on 12/30/2022) 50 g 0   No current facility-administered medications for this visit.    Allergies:   Oxybutynin    Social History:   reports that he has quit smoking. He has never used smokeless tobacco. He reports that he does not drink alcohol and does not use drugs.   Family History:  family history includes Drug abuse in his brother and sister.    ROS:     Review of Systems  Constitutional: Negative.   HENT: Negative.    Eyes: Negative.   Respiratory: Negative.    Cardiovascular:  Positive for chest pain.  Gastrointestinal: Negative.   Genitourinary: Negative.   Musculoskeletal: Negative.   Skin: Negative.   Neurological: Negative.   Endo/Heme/Allergies: Negative.   Psychiatric/Behavioral: Negative.    All other systems reviewed and are negative.     All other systems are reviewed and negative.    PHYSICAL EXAM: VS:  BP 138/86   Pulse 86   Ht 5\' 8"  (1.727 m)   Wt 163 lb 3.2 oz (74 kg)   SpO2 98%   BMI 24.81 kg/m  , BMI Body mass index is 24.81 kg/m. Last weight:  Wt Readings from Last 3 Encounters:  12/30/22 163 lb 3.2 oz (74 kg)  05/28/22 168 lb 3.2 oz (76.3 kg)  04/15/22 164 lb (74.4 kg)     Physical Exam Vitals reviewed.  Constitutional:      Appearance: Normal appearance. He is normal weight.  HENT:     Head: Normocephalic.     Nose: Nose normal.     Mouth/Throat:     Mouth: Mucous membranes are moist.  Eyes:     Pupils: Pupils are equal, round, and reactive to light.  Cardiovascular:     Rate and Rhythm: Normal rate and regular rhythm.     Pulses: Normal pulses.     Heart sounds: Normal heart sounds.  Pulmonary:     Effort: Pulmonary effort is normal.  Abdominal:     General: Abdomen is flat. Bowel sounds are normal.  Musculoskeletal:        General: Normal range of motion.     Cervical back:  Normal range of motion.  Skin:    General: Skin is warm.  Neurological:     General: No focal deficit present.     Mental Status: He is alert.  Psychiatric:        Mood and Affect: Mood normal.       EKG: atrial fibrillation with VR 95  Recent Labs: No results found for requested labs within last 365 days.    Lipid Panel    Component Value Date/Time   CHOL  10/20/2010 0400    94        ATP III CLASSIFICATION:  <200     mg/dL   Desirable  295-621  mg/dL   Borderline High  >=308    mg/dL   High          TRIG 657 (H) 10/23/2010 0600      Other studies Reviewed: Additional studies/ records that were reviewed today include:  Review of the above records demonstrates:       No data to display            ASSESSMENT AND PLAN:  No diagnosis found.   Problem List Items Addressed This Visit   None      Disposition:   No follow-ups on file.    Total time spent: 45 minutes  Signed,  Adrian Blackwater, MD  12/30/2022 10:13 AM    [facility]

## 2023-01-03 ENCOUNTER — Encounter: Payer: Self-pay | Admitting: Cardiovascular Disease

## 2023-01-11 ENCOUNTER — Ambulatory Visit (INDEPENDENT_AMBULATORY_CARE_PROVIDER_SITE_OTHER): Payer: Medicare Other

## 2023-01-11 DIAGNOSIS — I6523 Occlusion and stenosis of bilateral carotid arteries: Secondary | ICD-10-CM

## 2023-01-11 DIAGNOSIS — I480 Paroxysmal atrial fibrillation: Secondary | ICD-10-CM | POA: Diagnosis not present

## 2023-01-11 DIAGNOSIS — I25810 Atherosclerosis of coronary artery bypass graft(s) without angina pectoris: Secondary | ICD-10-CM | POA: Diagnosis not present

## 2023-01-11 DIAGNOSIS — R0789 Other chest pain: Secondary | ICD-10-CM | POA: Diagnosis not present

## 2023-01-11 DIAGNOSIS — Z48298 Encounter for aftercare following other organ transplant: Secondary | ICD-10-CM

## 2023-01-11 DIAGNOSIS — I10 Essential (primary) hypertension: Secondary | ICD-10-CM

## 2023-01-11 MED ORDER — TECHNETIUM TC 99M SESTAMIBI GENERIC - CARDIOLITE
10.1000 | Freq: Once | INTRAVENOUS | Status: AC | PRN
Start: 2023-01-11 — End: 2023-01-11
  Administered 2023-01-11: 10.1 via INTRAVENOUS

## 2023-01-11 MED ORDER — TECHNETIUM TC 99M SESTAMIBI GENERIC - CARDIOLITE
31.7000 | Freq: Once | INTRAVENOUS | Status: AC | PRN
  Administered 2023-01-11: 31.7 via INTRAVENOUS

## 2023-01-14 ENCOUNTER — Encounter: Payer: Self-pay | Admitting: Cardiovascular Disease

## 2023-01-14 ENCOUNTER — Ambulatory Visit (INDEPENDENT_AMBULATORY_CARE_PROVIDER_SITE_OTHER): Payer: Medicare Other | Admitting: Cardiovascular Disease

## 2023-01-14 VITALS — BP 106/56 | HR 81 | Ht 68.0 in | Wt 160.0 lb

## 2023-01-14 DIAGNOSIS — I480 Paroxysmal atrial fibrillation: Secondary | ICD-10-CM

## 2023-01-14 DIAGNOSIS — R9439 Abnormal result of other cardiovascular function study: Secondary | ICD-10-CM

## 2023-01-14 DIAGNOSIS — I10 Essential (primary) hypertension: Secondary | ICD-10-CM

## 2023-01-14 DIAGNOSIS — R0602 Shortness of breath: Secondary | ICD-10-CM | POA: Diagnosis not present

## 2023-01-14 DIAGNOSIS — I25708 Atherosclerosis of coronary artery bypass graft(s), unspecified, with other forms of angina pectoris: Secondary | ICD-10-CM | POA: Diagnosis not present

## 2023-01-14 DIAGNOSIS — I255 Ischemic cardiomyopathy: Secondary | ICD-10-CM

## 2023-01-14 MED ORDER — AMIODARONE HCL 400 MG PO TABS: ORAL_TABLET | ORAL | 0 refills | Status: DC

## 2023-01-14 NOTE — Progress Notes (Signed)
Cardiology Office Note   Date:  01/14/2023   ID:  [name], DOB 04/19/1956, MRN [id number]  PCP:  Abram Sander, MD  Cardiologist:  Adrian Blackwater, MD      History of Present Illness: [name] is a 67 y.o. male who presents for  Chief Complaint  Patient presents with   Follow-up    NST Results    Feels dizzy when gets up quickly      Past Medical History:  Diagnosis Date   Arthritis    Chronic kidney disease    KIDNEY TRANSPLANT   Coronary artery disease    Diabetes mellitus without complication (HCC)    Dyspnea    GERD (gastroesophageal reflux disease)    History of blood transfusion    Hypertension    Myocardial infarction [facility])      Past Surgical History:  Procedure Laterality Date   CATARACT EXTRACTION W/PHACO Right 02/03/2017   Procedure: CATARACT EXTRACTION PHACO AND INTRAOCULAR LENS PLACEMENT (IOC);  Surgeon: Lockie Mola, MD;  Location: [facility] ORS;  Service: Ophthalmology;  Laterality: Right;  [place] 00:35.8 AP% 12.8 CDE 4.57 Fluid lot # [id number]   COLONOSCOPY WITH PROPOFOL N/A 12/30/2021   Procedure: COLONOSCOPY WITH PROPOFOL;  Surgeon: Wyline Mood, MD;  Location: [facility] ENDOSCOPY;  Service: Gastroenterology;  Laterality: N/A;   CORONARY ANGIOPLASTY     STENTS X 4   CORONARY ARTERY BYPASS GRAFT     ENDARTERECTOMY Left 09/06/2019   Procedure: ENDARTERECTOMY CAROTID;  Surgeon: Annice Needy, MD;  Location: [facility] ORS;  Service: Vascular;  Laterality: Left;   EYE SURGERY Bilateral    cataract   HIP SURGERY     KIDNEY TRANSPLANT     2016     Current Outpatient Medications  Medication Sig Dispense Refill   amiodarone (PACERONE) 400 MG tablet Take twice a day for 1 week and then 1 tab daily after that 60 tablet 0   amLODipine (NORVASC) 10 MG tablet Take 10 mg by mouth daily.  (Patient not taking: Reported on 12/30/2022)     ascorbic acid (VITAMIN C) 500 MG tablet Take 500 mg by mouth daily.     aspirin EC 81 MG EC tablet Take 1 tablet  (81 mg total) by mouth daily at 6 (six) AM.     atorvastatin (LIPITOR) 40 MG tablet 40 mg daily at 6 PM.      carvedilol (COREG) 12.5 MG tablet Take 12.5 mg by mouth at bedtime. (Patient not taking: Reported on 12/30/2022)     carvedilol (COREG) 6.25 MG tablet Take 6.25 mg by mouth 2 (two) times daily with a meal.     CELLCEPT 250 MG capsule Take 500 mg by mouth 2 (two) times daily.     chlorthalidone (HYGROTON) 25 MG tablet Take 25 mg by mouth every morning.     Cholecalciferol (VITAMIN D3) 50 MCG (2000 UT) capsule Take 2,000 Units by mouth daily.      Continuous Blood Gluc Sensor (FREESTYLE LIBRE 2 SENSOR) MISC by Does not apply route.     ELIQUIS 5 MG TABS tablet Take 5 mg by mouth 2 (two) times daily.      finasteride (PROSCAR) 5 MG tablet TAKE 1 TABLET BY MOUTH ONCE DAILY. 90 tablet 0   HUMALOG KWIKPEN 100 UNIT/ML KiwkPen Inject 10-12 Units into the skin 3 (three) times daily. Inject 10u daily at breakfast, 10u at lunch and up to 12u at supper per SSI     Insulin Disposable  Pump (OMNIPOD DASH 5 PACK PODS) MISC      Insulin Pen Needle 33G X 4 MM MISC To use with insulin pen 4 times per day. E 13.9     LANTUS SOLOSTAR 100 UNIT/ML Solostar Pen Inject 24 Units into the skin at bedtime.      linagliptin (TRADJENTA) 5 MG TABS tablet Take 5 mg by mouth daily.  (Patient not taking: Reported on 12/30/2022)     losartan (COZAAR) 50 MG tablet Take 50 mg by mouth in the morning and at bedtime.     omeprazole (PRILOSEC) 20 MG capsule Take 20 mg by mouth daily as needed.      PROGRAF 1 MG capsule Take 0.5 mg by mouth at bedtime.     silver sulfADIAZINE (SILVADENE) 1 % cream Apply 1 Application topically daily. (Patient not taking: Reported on 12/30/2022) 50 g 0   tamsulosin (FLOMAX) 0.4 MG CAPS capsule Take 0.4 mg by mouth 2 (two) times daily.      trospium (SANCTURA) 20 MG tablet Take 1 tablet (20 mg total) by mouth at bedtime. (Patient taking differently: Take 20 mg by mouth in the morning and at bedtime.)  30 tablet 3   No current facility-administered medications for this visit.    Allergies:   Oxybutynin    Social History:   reports that he has quit smoking. He has never used smokeless tobacco. He reports that he does not drink alcohol and does not use drugs.   Family History:  family history includes Drug abuse in his brother and sister.    ROS:     Review of Systems  Constitutional: Negative.   HENT: Negative.    Eyes: Negative.   Respiratory: Negative.    Gastrointestinal: Negative.   Genitourinary: Negative.   Musculoskeletal: Negative.   Skin: Negative.   Neurological: Negative.   Endo/Heme/Allergies: Negative.   Psychiatric/Behavioral: Negative.    All other systems reviewed and are negative.     All other systems are reviewed and negative.    PHYSICAL EXAM: VS:  BP (!) 106/56   Pulse 81   Ht 5\' 8"  (1.727 m)   Wt 160 lb (72.6 kg)   SpO2 97%   BMI 24.33 kg/m  , BMI Body mass index is 24.33 kg/m. Last weight:  Wt Readings from Last 3 Encounters:  01/14/23 160 lb (72.6 kg)  12/30/22 163 lb 3.2 oz (74 kg)  05/28/22 168 lb 3.2 oz (76.3 kg)     Physical Exam Vitals reviewed.  Constitutional:      Appearance: Normal appearance. He is normal weight.  HENT:     Head: Normocephalic.     Nose: Nose normal.     Mouth/Throat:     Mouth: Mucous membranes are moist.  Eyes:     Pupils: Pupils are equal, round, and reactive to light.  Cardiovascular:     Rate and Rhythm: Normal rate and regular rhythm.     Pulses: Normal pulses.     Heart sounds: Normal heart sounds.  Pulmonary:     Effort: Pulmonary effort is normal.  Abdominal:     General: Abdomen is flat. Bowel sounds are normal.  Musculoskeletal:        General: Normal range of motion.     Cervical back: Normal range of motion.  Skin:    General: Skin is warm.  Neurological:     General: No focal deficit present.     Mental Status: He is alert.  Psychiatric:  Mood and Affect: Mood normal.        EKG:   Recent Labs: No results found for requested labs within last 365 days.    Lipid Panel    Component Value Date/Time   CHOL  10/20/2010 0400    94        ATP III CLASSIFICATION:  <200     mg/dL   Desirable  528-413  mg/dL   Borderline High  >=244    mg/dL   High          TRIG 010 (H) 10/23/2010 0600      Other studies Reviewed: Additional studies/ records that were reviewed today include:  Review of the above records demonstrates:   REASON FOR VISIT  Referred by Dr. Adrian Blackwater.        TESTS  Imaging: Computed Tomographic Angiography:  Cardiac multidetector CT was performed paying particular attention to the coronary arteries for the diagnosis of: CAD (I25.10). Diagnostic Drugs:  Administered iohexol (Omnipaque) through an antecubital vein and images from the examination were analyzed for the presence and extent of coronary artery disease, using 3D image processing software. 100 mL of non-ionic contrast (Omnipaque) was used.        ASSESSMENT   The proximal circumflex artery was abnormal:3  The distal circumflex artery was abnormal:3  The first obtuse marginal branch artery (OM-1) was abnormal:3  The proximal right coronary artery (RCA) was abnormal:2  The mid right coronary artery (RCA) was abnormal:3  The distal right coronary artery (RCA) was abnormal:5  The LIMA origin was visualized To LAD patent  The SVG origin was visualized To PDQ     TEST CONCLUSIONS  Quality of study: Excellent.  1-Calcium score: 3915.7  2-Right dominant system.  3-LIMA to LAD and PDA patent. Moderate disease in diffusely calcified LCX. Treat medically.      Adrian Blackwater MD  Electronically signed by: Adrian Blackwater     Date: 01/06/2018 12:37     No data to display            ASSESSMENT AND PLAN:    ICD-10-CM   1. Paroxysmal atrial fibrillation (HCC)  I48.0    In afib at this time but asymptomatic, will start loading dose amiodrone.    2. Coronary  artery disease involving coronary bypass graft of native heart with other forms of angina pectoris (HCC)  I25.708    stress test shows infarction in LAD/LCX territory with EF 37%, and afib. Hesitant to do ccta as had renal transplant.    3. Essential (primary) hypertension  I10     4. SOB (shortness of breath)  R06.02     5. Ischemic cardiomyopathy  I25.5     6. Abnormal nuclear stress test  R94.39        Problem List Items Addressed This Visit       Cardiovascular and Mediastinum   Essential (primary) hypertension   Relevant Medications   amiodarone (PACERONE) 400 MG tablet   Coronary artery disease   Relevant Medications   amiodarone (PACERONE) 400 MG tablet   Paroxysmal atrial fibrillation (HCC) - Primary   Relevant Medications   amiodarone (PACERONE) 400 MG tablet   Other Visit Diagnoses     SOB (shortness of breath)       Ischemic cardiomyopathy       Relevant Medications   amiodarone (PACERONE) 400 MG tablet   Abnormal nuclear stress test  Disposition:   No follow-ups on file.    Total time spent: 30 minutes  Signed,  Adrian Blackwater, MD  01/14/2023 11:40 AM    [facility]

## 2023-01-24 ENCOUNTER — Encounter: Payer: Self-pay | Admitting: Cardiovascular Disease

## 2023-01-24 ENCOUNTER — Ambulatory Visit: Payer: Medicare Other | Admitting: Cardiovascular Disease

## 2023-01-24 VITALS — BP 115/70 | HR 55 | Ht 68.0 in | Wt 166.8 lb

## 2023-01-24 DIAGNOSIS — I10 Essential (primary) hypertension: Secondary | ICD-10-CM

## 2023-01-24 DIAGNOSIS — I25708 Atherosclerosis of coronary artery bypass graft(s), unspecified, with other forms of angina pectoris: Secondary | ICD-10-CM

## 2023-01-24 DIAGNOSIS — I480 Paroxysmal atrial fibrillation: Secondary | ICD-10-CM | POA: Diagnosis not present

## 2023-01-24 DIAGNOSIS — I259 Chronic ischemic heart disease, unspecified: Secondary | ICD-10-CM

## 2023-01-24 DIAGNOSIS — E7800 Pure hypercholesterolemia, unspecified: Secondary | ICD-10-CM

## 2023-01-24 MED ORDER — CARVEDILOL 6.25 MG PO TABS
6.2500 mg | ORAL_TABLET | Freq: Two times a day (BID) | ORAL | 11 refills | Status: DC
Start: 2023-01-24 — End: 2023-01-28

## 2023-01-24 NOTE — Progress Notes (Signed)
Cardiology Office Note   Date:  01/24/2023   ID:  [name], [name] 1956-02-19, MRN [id number]  PCP:  Abram Sander, MD  Cardiologist:  Adrian Blackwater, MD      History of Present Illness: [name] is a 67 y.o. male who presents for  Chief Complaint  Patient presents with   Acute Visit    Low heart Rate    Had heart rate in the 39 and asked to be seen. He was placed on amiodrone 400 bid as was in afib with RVR, He felt dizzy, but heart rate here 67, on EKG  Dizziness      Past Medical History:  Diagnosis Date   Arthritis    Chronic kidney disease    KIDNEY TRANSPLANT   Coronary artery disease    Diabetes mellitus without complication (HCC)    Dyspnea    GERD (gastroesophageal reflux disease)    History of blood transfusion    Hypertension    Myocardial infarction [facility])      Past Surgical History:  Procedure Laterality Date   CATARACT EXTRACTION W/PHACO Right 02/03/2017   Procedure: CATARACT EXTRACTION PHACO AND INTRAOCULAR LENS PLACEMENT (IOC);  Surgeon: Lockie Mola, MD;  Location: [facility] ORS;  Service: Ophthalmology;  Laterality: Right;  [place] 00:35.8 AP% 12.8 CDE 4.57 Fluid lot # [id number] H   COLONOSCOPY WITH PROPOFOL N/A 12/30/2021   Procedure: COLONOSCOPY WITH PROPOFOL;  Surgeon: Wyline Mood, MD;  Location: [facility] ENDOSCOPY;  Service: Gastroenterology;  Laterality: N/A;   CORONARY ANGIOPLASTY     STENTS X 4   CORONARY ARTERY BYPASS GRAFT     ENDARTERECTOMY Left 09/06/2019   Procedure: ENDARTERECTOMY CAROTID;  Surgeon: Annice Needy, MD;  Location: [facility] ORS;  Service: Vascular;  Laterality: Left;   EYE SURGERY Bilateral    cataract   HIP SURGERY     KIDNEY TRANSPLANT     2016     Current Outpatient Medications  Medication Sig Dispense Refill   carvedilol (COREG) 6.25 MG tablet Take 1 tablet (6.25 mg total) by mouth 2 (two) times daily. 60 tablet 11   amiodarone (PACERONE) 400 MG tablet Take twice a day for 1 week and then 1 tab daily  after that 60 tablet 0   ascorbic acid (VITAMIN C) 500 MG tablet Take 500 mg by mouth daily.     aspirin EC 81 MG EC tablet Take 1 tablet (81 mg total) by mouth daily at 6 (six) AM.     atorvastatin (LIPITOR) 40 MG tablet 40 mg daily at 6 PM.      CELLCEPT 250 MG capsule Take 500 mg by mouth 2 (two) times daily.     chlorthalidone (HYGROTON) 25 MG tablet Take 25 mg by mouth every morning.     Cholecalciferol (VITAMIN D3) 50 MCG (2000 UT) capsule Take 2,000 Units by mouth daily.      Continuous Blood Gluc Sensor (FREESTYLE LIBRE 2 SENSOR) MISC by Does not apply route.     ELIQUIS 5 MG TABS tablet Take 5 mg by mouth 2 (two) times daily.      finasteride (PROSCAR) 5 MG tablet TAKE 1 TABLET BY MOUTH ONCE DAILY. 90 tablet 0   HUMALOG KWIKPEN 100 UNIT/ML KiwkPen Inject 10-12 Units into the skin 3 (three) times daily. Inject 10u daily at breakfast, 10u at lunch and up to 12u at supper per SSI     Insulin Disposable Pump (OMNIPOD DASH 5 PACK PODS) MISC  Insulin Pen Needle 33G X 4 MM MISC To use with insulin pen 4 times per day. E 13.9     LANTUS SOLOSTAR 100 UNIT/ML Solostar Pen Inject 24 Units into the skin at bedtime.      linagliptin (TRADJENTA) 5 MG TABS tablet Take 5 mg by mouth daily.  (Patient not taking: Reported on 12/30/2022)     losartan (COZAAR) 50 MG tablet Take 50 mg by mouth in the morning and at bedtime.     omeprazole (PRILOSEC) 20 MG capsule Take 20 mg by mouth daily as needed.      PROGRAF 1 MG capsule Take 0.5 mg by mouth at bedtime.     silver sulfADIAZINE (SILVADENE) 1 % cream Apply 1 Application topically daily. (Patient not taking: Reported on 12/30/2022) 50 g 0   tamsulosin (FLOMAX) 0.4 MG CAPS capsule Take 0.4 mg by mouth 2 (two) times daily.      trospium (SANCTURA) 20 MG tablet Take 1 tablet (20 mg total) by mouth at bedtime. (Patient taking differently: Take 20 mg by mouth in the morning and at bedtime.) 30 tablet 3   No current facility-administered medications for this  visit.    Allergies:   Oxybutynin    Social History:   reports that he has quit smoking. He has never used smokeless tobacco. He reports that he does not drink alcohol and does not use drugs.   Family History:  family history includes Drug abuse in his brother and sister.    ROS:     Review of Systems  Constitutional: Negative.   HENT: Negative.    Eyes: Negative.   Respiratory: Negative.    Gastrointestinal: Negative.   Genitourinary: Negative.   Musculoskeletal: Negative.   Skin: Negative.   Neurological:  Positive for dizziness.  Endo/Heme/Allergies: Negative.   Psychiatric/Behavioral: Negative.    All other systems reviewed and are negative.     All other systems are reviewed and negative.    PHYSICAL EXAM: VS:  BP 115/70   Pulse (!) 55   Ht 5\' 8"  (1.727 m)   Wt 166 lb 12.8 oz (75.7 kg)   SpO2 97%   BMI 25.36 kg/m  , BMI Body mass index is 25.36 kg/m. Last weight:  Wt Readings from Last 3 Encounters:  01/24/23 166 lb 12.8 oz (75.7 kg)  01/14/23 160 lb (72.6 kg)  12/30/22 163 lb 3.2 oz (74 kg)     Physical Exam Vitals reviewed.  Constitutional:      Appearance: Normal appearance. He is normal weight.  HENT:     Head: Normocephalic.     Nose: Nose normal.     Mouth/Throat:     Mouth: Mucous membranes are moist.  Eyes:     Pupils: Pupils are equal, round, and reactive to light.  Cardiovascular:     Rate and Rhythm: Normal rate and regular rhythm.     Pulses: Normal pulses.     Heart sounds: Normal heart sounds.  Pulmonary:     Effort: Pulmonary effort is normal.  Abdominal:     General: Abdomen is flat. Bowel sounds are normal.  Musculoskeletal:        General: Normal range of motion.     Cervical back: Normal range of motion.  Skin:    General: Skin is warm.  Neurological:     General: No focal deficit present.     Mental Status: He is alert.  Psychiatric:        Mood and Affect:  Mood normal.       EKG:   Recent Labs: No results  found for requested labs within last 365 days.    Lipid Panel    Component Value Date/Time   CHOL  10/20/2010 0400    94        ATP III CLASSIFICATION:  <200     mg/dL   Desirable  010-272  mg/dL   Borderline High  >=536    mg/dL   High          TRIG 644 (H) 10/23/2010 0600      Other studies Reviewed: Additional studies/ records that were reviewed today include:  Review of the above records demonstrates:       No data to display            ASSESSMENT AND PLAN:    ICD-10-CM   1. Essential (primary) hypertension  I10 carvedilol (COREG) 6.25 MG tablet    2. Coronary artery disease involving coronary bypass graft of native heart with other forms of angina pectoris (HCC)  I25.708 carvedilol (COREG) 6.25 MG tablet    3. Paroxysmal atrial fibrillation (HCC)  I48.0 carvedilol (COREG) 6.25 MG tablet   EKG has Afib, VR 67. Will change coreg to 6.25 bid, continue amiodrone as prior instruction.    4. Chest pain due to myocardial ischemia, unspecified ischemic chest pain type  I25.9 carvedilol (COREG) 6.25 MG tablet    5. Hypercholesteremia  E78.00 carvedilol (COREG) 6.25 MG tablet       Problem List Items Addressed This Visit       Cardiovascular and Mediastinum   Essential (primary) hypertension - Primary   Relevant Medications   carvedilol (COREG) 6.25 MG tablet   Coronary artery disease   Relevant Medications   carvedilol (COREG) 6.25 MG tablet   Paroxysmal atrial fibrillation (HCC)   Relevant Medications   carvedilol (COREG) 6.25 MG tablet     Other   Chest pain   Relevant Medications   carvedilol (COREG) 6.25 MG tablet   Hypercholesteremia   Relevant Medications   carvedilol (COREG) 6.25 MG tablet       Disposition:   Return in about 1 week (around 01/31/2023).    Total time spent: 35 minutes  Signed,  Adrian Blackwater, MD  01/24/2023 1:32 PM    [facility]

## 2023-01-25 ENCOUNTER — Encounter: Payer: Self-pay | Admitting: Cardiovascular Disease

## 2023-01-28 ENCOUNTER — Ambulatory Visit: Payer: Medicare Other | Admitting: Cardiovascular Disease

## 2023-01-28 ENCOUNTER — Encounter: Payer: Self-pay | Admitting: Cardiovascular Disease

## 2023-01-28 VITALS — BP 100/50 | HR 60 | Ht 68.0 in | Wt 163.2 lb

## 2023-01-28 DIAGNOSIS — I6523 Occlusion and stenosis of bilateral carotid arteries: Secondary | ICD-10-CM | POA: Diagnosis not present

## 2023-01-28 DIAGNOSIS — I25708 Atherosclerosis of coronary artery bypass graft(s), unspecified, with other forms of angina pectoris: Secondary | ICD-10-CM

## 2023-01-28 DIAGNOSIS — R001 Bradycardia, unspecified: Secondary | ICD-10-CM

## 2023-01-28 DIAGNOSIS — I480 Paroxysmal atrial fibrillation: Secondary | ICD-10-CM | POA: Diagnosis not present

## 2023-01-28 DIAGNOSIS — Z940 Kidney transplant status: Secondary | ICD-10-CM

## 2023-01-28 DIAGNOSIS — E7800 Pure hypercholesterolemia, unspecified: Secondary | ICD-10-CM | POA: Diagnosis not present

## 2023-01-28 MED ORDER — AMIODARONE HCL 400 MG PO TABS: ORAL_TABLET | ORAL | 0 refills | Status: DC

## 2023-01-28 NOTE — Progress Notes (Signed)
Cardiology Office Note   Date:  01/28/2023   ID:  [name], [name] 02-12-1956, MRN [id number]  PCP:  Leonard Sander, MD  Cardiologist:  Leonard Blackwater, MD      History of Present Illness: [name] is a 67 y.o. male who presents for  Chief Complaint  Patient presents with   Acute Visit    Low heart rate    STILL DIZZY AND BRADYCARDIC      Past Medical History:  Diagnosis Date   Arthritis    Chronic kidney disease    KIDNEY TRANSPLANT   Coronary artery disease    Diabetes mellitus without complication (HCC)    Dyspnea    GERD (gastroesophageal reflux disease)    History of blood transfusion    Hypertension    Myocardial infarction [facility])      Past Surgical History:  Procedure Laterality Date   CATARACT EXTRACTION W/PHACO Right 02/03/2017   Procedure: CATARACT EXTRACTION PHACO AND INTRAOCULAR LENS PLACEMENT (IOC);  Surgeon: Lockie Mola, MD;  Location: [facility] ORS;  Service: Ophthalmology;  Laterality: Right;  [place] 00:35.8 AP% 12.8 CDE 4.57 Fluid lot # [id number]   COLONOSCOPY WITH PROPOFOL N/A 12/30/2021   Procedure: COLONOSCOPY WITH PROPOFOL;  Surgeon: Wyline Mood, MD;  Location: [facility] ENDOSCOPY;  Service: Gastroenterology;  Laterality: N/A;   CORONARY ANGIOPLASTY     STENTS X 4   CORONARY ARTERY BYPASS GRAFT     ENDARTERECTOMY Left 09/06/2019   Procedure: ENDARTERECTOMY CAROTID;  Surgeon: Annice Needy, MD;  Location: [facility] ORS;  Service: Vascular;  Laterality: Left;   EYE SURGERY Bilateral    cataract   HIP SURGERY     KIDNEY TRANSPLANT     2016     Current Outpatient Medications  Medication Sig Dispense Refill   amiodarone (PACERONE) 400 MG tablet Take twice a day for 1 week and then 1 tab daily after that 60 tablet 0   ascorbic acid (VITAMIN C) 500 MG tablet Take 500 mg by mouth daily.     aspirin EC 81 MG EC tablet Take 1 tablet (81 mg total) by mouth daily at 6 (six) AM.     atorvastatin (LIPITOR) 40 MG tablet 40 mg daily at 6 PM.       CELLCEPT 250 MG capsule Take 500 mg by mouth 2 (two) times daily.     Cholecalciferol (VITAMIN D3) 50 MCG (2000 UT) capsule Take 2,000 Units by mouth daily.      Continuous Blood Gluc Sensor (FREESTYLE LIBRE 2 SENSOR) MISC by Does not apply route.     ELIQUIS 5 MG TABS tablet Take 5 mg by mouth 2 (two) times daily.      finasteride (PROSCAR) 5 MG tablet TAKE 1 TABLET BY MOUTH ONCE DAILY. 90 tablet 0   HUMALOG KWIKPEN 100 UNIT/ML KiwkPen Inject 10-12 Units into the skin 3 (three) times daily. Inject 10u daily at breakfast, 10u at lunch and up to 12u at supper per SSI     Insulin Disposable Pump (OMNIPOD DASH 5 PACK PODS) MISC      Insulin Pen Needle 33G X 4 MM MISC To use with insulin pen 4 times per day. E 13.9     LANTUS SOLOSTAR 100 UNIT/ML Solostar Pen Inject 24 Units into the skin at bedtime.      linagliptin (TRADJENTA) 5 MG TABS tablet Take 5 mg by mouth daily.  (Patient not taking: Reported on 12/30/2022)     losartan (COZAAR) 50 MG  tablet Take 50 mg by mouth daily.     omeprazole (PRILOSEC) 20 MG capsule Take 20 mg by mouth daily as needed.      PROGRAF 1 MG capsule Take 0.5 mg by mouth at bedtime.     silver sulfADIAZINE (SILVADENE) 1 % cream Apply 1 Application topically daily. (Patient not taking: Reported on 12/30/2022) 50 g 0   tamsulosin (FLOMAX) 0.4 MG CAPS capsule Take 0.4 mg by mouth 2 (two) times daily.      trospium (SANCTURA) 20 MG tablet Take 1 tablet (20 mg total) by mouth at bedtime. (Patient taking differently: Take 20 mg by mouth in the morning and at bedtime.) 30 tablet 3   No current facility-administered medications for this visit.    Allergies:   Oxybutynin    Social History:   reports that he has quit smoking. He has never used smokeless tobacco. He reports that he does not drink alcohol and does not use drugs.   Family History:  family history includes Drug abuse in his brother and sister.    ROS:     Review of Systems  Constitutional: Negative.   HENT:  Negative.    Eyes: Negative.   Respiratory: Negative.    Gastrointestinal: Negative.   Genitourinary: Negative.   Musculoskeletal: Negative.   Skin: Negative.   Neurological: Negative.   Endo/Heme/Allergies: Negative.   Psychiatric/Behavioral: Negative.    All other systems reviewed and are negative.     All other systems are reviewed and negative.    PHYSICAL EXAM: VS:  BP (!) 100/50   Pulse 60   Ht 5\' 8"  (1.727 m)   Wt 163 lb 3.2 oz (74 kg)   SpO2 94%   BMI 24.81 kg/m  , BMI Body mass index is 24.81 kg/m. Last weight:  Wt Readings from Last 3 Encounters:  01/28/23 163 lb 3.2 oz (74 kg)  01/24/23 166 lb 12.8 oz (75.7 kg)  01/14/23 160 lb (72.6 kg)     Physical Exam Vitals reviewed.  Constitutional:      Appearance: Normal appearance. He is normal weight.  HENT:     Head: Normocephalic.     Nose: Nose normal.     Mouth/Throat:     Mouth: Mucous membranes are moist.  Eyes:     Pupils: Pupils are equal, round, and reactive to light.  Cardiovascular:     Rate and Rhythm: Normal rate and regular rhythm.     Pulses: Normal pulses.     Heart sounds: Normal heart sounds.  Pulmonary:     Effort: Pulmonary effort is normal.  Abdominal:     General: Abdomen is flat. Bowel sounds are normal.  Musculoskeletal:        General: Normal range of motion.     Cervical back: Normal range of motion.  Skin:    General: Skin is warm.  Neurological:     General: No focal deficit present.     Mental Status: He is alert.  Psychiatric:        Mood and Affect: Mood normal.       EKG:   Recent Labs: No results found for requested labs within last 365 days.    Lipid Panel    Component Value Date/Time   CHOL  10/20/2010 0400    94        ATP III CLASSIFICATION:  <200     mg/dL   Desirable  161-096  mg/dL   Borderline High  >=045  mg/dL   High          TRIG 147 (H) 10/23/2010 0600      Other studies Reviewed: Additional studies/ records that were reviewed  today include:  Review of the above records demonstrates:       No data to display            ASSESSMENT AND PLAN:    ICD-10-CM   1. Bilateral carotid artery stenosis  I65.23     2. Coronary artery disease involving coronary bypass graft of native heart with other forms of angina pectoris (HCC)  I25.708     3. Paroxysmal atrial fibrillation (HCC)  I48.0    CONTINUE AMIO 200 BID    4. Hypercholesteremia  E78.00     5. History of renal transplant  Z94.0     6. Sinus bradycardia  R00.1    WEN OFF COREG AND STOP CHOLORTHALIDONE       Problem List Items Addressed This Visit       Cardiovascular and Mediastinum   Coronary artery disease   Relevant Medications   amiodarone (PACERONE) 400 MG tablet   Paroxysmal atrial fibrillation (HCC)   Relevant Medications   amiodarone (PACERONE) 400 MG tablet   Carotid artery stenosis - Primary   Relevant Medications   amiodarone (PACERONE) 400 MG tablet     Other   History of renal transplant   Hypercholesteremia   Relevant Medications   amiodarone (PACERONE) 400 MG tablet   Other Visit Diagnoses     Sinus bradycardia       WEN OFF COREG AND STOP CHOLORTHALIDONE   Relevant Medications   amiodarone (PACERONE) 400 MG tablet          Disposition:   Return in about 1 week (around 02/04/2023).    Total time spent: 30 minutes  Signed,  Leonard Blackwater, MD  01/28/2023 11:44 AM    [facility]

## 2023-02-01 ENCOUNTER — Ambulatory Visit: Admit: 2023-02-01 | Discharge: 2023-02-01 | Disposition: A | Discharge status: Home / Self Care | Payer: MEDICARE

## 2023-02-01 DIAGNOSIS — N419 Inflammatory disease of prostate, unspecified: Principal | ICD-10-CM

## 2023-02-01 MED ORDER — CIPROFLOXACIN 500 MG TABLET
ORAL_TABLET | Freq: Two times a day (BID) | ORAL | 0 refills | 28 days | Status: CP
Start: 2023-02-01 — End: 2023-03-01

## 2023-02-07 ENCOUNTER — Ambulatory Visit: Payer: Medicare Other | Admitting: Cardiovascular Disease

## 2023-02-07 ENCOUNTER — Ambulatory Visit
Admit: 2023-02-07 | Discharge: 2023-02-07 | Disposition: A | Discharge status: Home / Self Care | Payer: MEDICARE | Attending: Student in an Organized Health Care Education/Training Program

## 2023-02-14 ENCOUNTER — Ambulatory Visit: Payer: Medicare Other | Admitting: Cardiovascular Disease

## 2023-02-14 ENCOUNTER — Encounter: Payer: Self-pay | Admitting: Cardiovascular Disease

## 2023-02-14 VITALS — BP 138/60 | HR 64 | Ht 68.0 in | Wt 169.0 lb

## 2023-02-14 DIAGNOSIS — I509 Heart failure, unspecified: Secondary | ICD-10-CM

## 2023-02-14 DIAGNOSIS — Z940 Kidney transplant status: Secondary | ICD-10-CM | POA: Diagnosis not present

## 2023-02-14 DIAGNOSIS — I25708 Atherosclerosis of coronary artery bypass graft(s), unspecified, with other forms of angina pectoris: Secondary | ICD-10-CM | POA: Diagnosis not present

## 2023-02-14 DIAGNOSIS — E7800 Pure hypercholesterolemia, unspecified: Secondary | ICD-10-CM | POA: Diagnosis not present

## 2023-02-14 DIAGNOSIS — Z8679 Personal history of other diseases of the circulatory system: Secondary | ICD-10-CM

## 2023-02-14 DIAGNOSIS — R001 Bradycardia, unspecified: Secondary | ICD-10-CM

## 2023-02-14 NOTE — Progress Notes (Signed)
Cardiology Office Note   Date:  02/14/2023   ID:  [name], DOB Mar 09, 1956, MRN [id number]  PCP:  Leonard Sander, MD  Cardiologist:  Leonard Blackwater, MD      History of Present Illness: [name] is a 67 y.o. male who presents for  Chief Complaint  Patient presents with   Follow-up    4 Weeks Follow Up    Has trouble with bowel movements      Past Medical History:  Diagnosis Date   Arthritis    Chronic kidney disease    KIDNEY TRANSPLANT   Coronary artery disease    Diabetes mellitus without complication (HCC)    Dyspnea    GERD (gastroesophageal reflux disease)    History of blood transfusion    Hypertension    Myocardial infarction [facility])      Past Surgical History:  Procedure Laterality Date   CATARACT EXTRACTION W/PHACO Right 02/03/2017   Procedure: CATARACT EXTRACTION PHACO AND INTRAOCULAR LENS PLACEMENT (IOC);  Surgeon: Lockie Mola, MD;  Location: [facility] ORS;  Service: Ophthalmology;  Laterality: Right;  [place] 00:35.8 AP% 12.8 CDE 4.57 Fluid lot # [id number]   COLONOSCOPY WITH PROPOFOL N/A 12/30/2021   Procedure: COLONOSCOPY WITH PROPOFOL;  Surgeon: Wyline Mood, MD;  Location: [facility] ENDOSCOPY;  Service: Gastroenterology;  Laterality: N/A;   CORONARY ANGIOPLASTY     STENTS X 4   CORONARY ARTERY BYPASS GRAFT     ENDARTERECTOMY Left 09/06/2019   Procedure: ENDARTERECTOMY CAROTID;  Surgeon: Annice Needy, MD;  Location: [facility] ORS;  Service: Vascular;  Laterality: Left;   EYE SURGERY Bilateral    cataract   HIP SURGERY     KIDNEY TRANSPLANT     2016     Current Outpatient Medications  Medication Sig Dispense Refill   amiodarone (PACERONE) 400 MG tablet Take twice a day for 1 week and then 1 tab daily after that 60 tablet 0   ascorbic acid (VITAMIN C) 500 MG tablet Take 500 mg by mouth daily.     aspirin EC 81 MG EC tablet Take 1 tablet (81 mg total) by mouth daily at 6 (six) AM.     atorvastatin (LIPITOR) 40 MG tablet 40 mg daily at 6  PM.      CELLCEPT 250 MG capsule Take 500 mg by mouth 2 (two) times daily.     Cholecalciferol (VITAMIN D3) 50 MCG (2000 UT) capsule Take 2,000 Units by mouth daily.      Continuous Blood Gluc Sensor (FREESTYLE LIBRE 2 SENSOR) MISC by Does not apply route.     ELIQUIS 5 MG TABS tablet Take 5 mg by mouth 2 (two) times daily.      finasteride (PROSCAR) 5 MG tablet TAKE 1 TABLET BY MOUTH ONCE DAILY. 90 tablet 0   HUMALOG KWIKPEN 100 UNIT/ML KiwkPen Inject 10-12 Units into the skin 3 (three) times daily. Inject 10u daily at breakfast, 10u at lunch and up to 12u at supper per SSI     Insulin Disposable Pump (OMNIPOD DASH 5 PACK PODS) MISC      Insulin Pen Needle 33G X 4 MM MISC To use with insulin pen 4 times per day. E 13.9     LANTUS SOLOSTAR 100 UNIT/ML Solostar Pen Inject 24 Units into the skin at bedtime.      linagliptin (TRADJENTA) 5 MG TABS tablet Take 5 mg by mouth daily.  (Patient not taking: Reported on 12/30/2022)     losartan (COZAAR) 50  MG tablet Take 50 mg by mouth daily.     omeprazole (PRILOSEC) 20 MG capsule Take 20 mg by mouth daily as needed.      PROGRAF 1 MG capsule Take 0.5 mg by mouth at bedtime.     silver sulfADIAZINE (SILVADENE) 1 % cream Apply 1 Application topically daily. (Patient not taking: Reported on 12/30/2022) 50 g 0   tamsulosin (FLOMAX) 0.4 MG CAPS capsule Take 0.4 mg by mouth 2 (two) times daily.      trospium (SANCTURA) 20 MG tablet Take 1 tablet (20 mg total) by mouth at bedtime. (Patient taking differently: Take 20 mg by mouth in the morning and at bedtime.) 30 tablet 3   No current facility-administered medications for this visit.    Allergies:   Oxybutynin    Social History:   reports that he has quit smoking. He has never used smokeless tobacco. He reports that he does not drink alcohol and does not use drugs.   Family History:  family history includes Drug abuse in his brother and sister.    ROS:     Review of Systems  Constitutional: Negative.    HENT: Negative.    Eyes: Negative.   Respiratory: Negative.    Gastrointestinal: Negative.   Genitourinary: Negative.   Musculoskeletal: Negative.   Skin: Negative.   Neurological: Negative.   Endo/Heme/Allergies: Negative.   Psychiatric/Behavioral: Negative.    All other systems reviewed and are negative.     All other systems are reviewed and negative.    PHYSICAL EXAM: VS:  BP 138/60   Pulse 64   Ht 5\' 8"  (1.727 m)   Wt 169 lb (76.7 kg)   SpO2 95%   BMI 25.70 kg/m  , BMI Body mass index is 25.7 kg/m. Last weight:  Wt Readings from Last 3 Encounters:  02/14/23 169 lb (76.7 kg)  01/28/23 163 lb 3.2 oz (74 kg)  01/24/23 166 lb 12.8 oz (75.7 kg)     Physical Exam Vitals reviewed.  Constitutional:      Appearance: Normal appearance. He is normal weight.  HENT:     Head: Normocephalic.     Nose: Nose normal.     Mouth/Throat:     Mouth: Mucous membranes are moist.  Eyes:     Pupils: Pupils are equal, round, and reactive to light.  Cardiovascular:     Rate and Rhythm: Normal rate and regular rhythm.     Pulses: Normal pulses.     Heart sounds: Normal heart sounds.  Pulmonary:     Effort: Pulmonary effort is normal.  Abdominal:     General: Abdomen is flat. Bowel sounds are normal.  Musculoskeletal:        General: Normal range of motion.     Cervical back: Normal range of motion.  Skin:    General: Skin is warm.  Neurological:     General: No focal deficit present.     Mental Status: He is alert.  Psychiatric:        Mood and Affect: Mood normal.       EKG:   Recent Labs: No results found for requested labs within last 365 days.    Lipid Panel    Component Value Date/Time   CHOL  10/20/2010 0400    94        ATP III CLASSIFICATION:  <200     mg/dL   Desirable  161-096  mg/dL   Borderline High  >=045    mg/dL  High          TRIG 154 (H) 10/23/2010 0600      Other studies Reviewed: Additional studies/ records that were reviewed  today include:  Review of the above records demonstrates:       No data to display            ASSESSMENT AND PLAN:    ICD-10-CM   1. Hypercholesteremia  E78.00     2. Coronary artery disease involving coronary bypass graft of native heart with other forms of angina pectoris (HCC)  I25.708     3. Sinus bradycardia  R00.1    In NSR now amiodrone 400 daily    4. History of renal transplant  Z94.0     5. Heart failure, unspecified HF chronicity, unspecified heart failure type (HCC)  I50.9     6. Atrial fibrillation, currently in sinus rhythm  Z86.79    Back in NSR       Problem List Items Addressed This Visit       Cardiovascular and Mediastinum   Coronary artery disease     Other   History of renal transplant   Hypercholesteremia - Primary   Other Visit Diagnoses     Sinus bradycardia       In NSR now amiodrone 400 daily   Heart failure, unspecified HF chronicity, unspecified heart failure type (HCC)       Atrial fibrillation, currently in sinus rhythm       Back in NSR          Disposition:   Return in about 5 weeks (around 03/21/2023).    Total time spent: 30 minutes  Signed,  Leonard Blackwater, MD  02/14/2023 9:40 AM    [facility]

## 2023-02-17 ENCOUNTER — Ambulatory Visit: Payer: Medicare Other | Admitting: Physician Assistant

## 2023-02-17 VITALS — BP 146/58 | HR 51 | Ht 68.0 in | Wt 165.0 lb

## 2023-02-17 DIAGNOSIS — R339 Retention of urine, unspecified: Secondary | ICD-10-CM | POA: Diagnosis not present

## 2023-02-17 DIAGNOSIS — K5900 Constipation, unspecified: Secondary | ICD-10-CM | POA: Diagnosis not present

## 2023-02-17 LAB — BLADDER SCAN AMB NON-IMAGING: Scan Result: 58

## 2023-02-17 NOTE — Progress Notes (Signed)
02/17/2023 9:26 AM   [name] 1956-01-21 [id number]  CC: Chief Complaint  Patient presents with   voiding trial   HPI: [name] is a 67 y.o. male with PMH diabetes, CKD s/p renal transplant, BPH with LUTS on Flomax and finasteride, nocturia on trospium, and ED with a history of urinary retention in 2023 after starting Myrbetriq who presents today for voiding trial.   He was seen at the [facility] ED on 02/01/2023 with reports of lower abdominal pain, dysuria, and constipation.  UA was notable for 90 WBCs/hpf, 6 RBCs/hpf, and no bacteria. Urine culture grew mixed urogenital flora. He was diagnosed with prostatitis and started on  Cipro 500mg  BID x28 days.  He returned to the ED 5 days later with reports of continued constipation and the inability to void x 12 hours.  PVR was elevated at  ~300 mL and Foley catheter was placed with immediate drainage of 400 mL of urine.  Foley removed in the morning; see separate note.  He returned to clinic in the afternoon and reports he has voided 3 times without difficulty.  PVR 58 mL.  He has continued Flomax 0.8 mg daily, finasteride, and trospium.  He is still on Cipro as prescribed by the ED.  He reports ongoing constipation and states that he has tried multiple OTC medications including MiraLAX, none of which have worked for him.  PMH: Past Medical History:  Diagnosis Date   Arthritis    Chronic kidney disease    KIDNEY TRANSPLANT   Coronary artery disease    Diabetes mellitus without complication (HCC)    Dyspnea    GERD (gastroesophageal reflux disease)    History of blood transfusion    Hypertension    Myocardial infarction [facility])     Surgical History: Past Surgical History:  Procedure Laterality Date   CATARACT EXTRACTION W/PHACO Right 02/03/2017   Procedure: CATARACT EXTRACTION PHACO AND INTRAOCULAR LENS PLACEMENT (IOC);  Surgeon: Lockie Mola, MD;  Location: [facility] ORS;  Service: Ophthalmology;   Laterality: Right;  [place] 00:35.8 AP% 12.8 CDE 4.57 Fluid lot # [id number]   COLONOSCOPY WITH PROPOFOL N/A 12/30/2021   Procedure: COLONOSCOPY WITH PROPOFOL;  Surgeon: Wyline Mood, MD;  Location: [facility] ENDOSCOPY;  Service: Gastroenterology;  Laterality: N/A;   CORONARY ANGIOPLASTY     STENTS X 4   CORONARY ARTERY BYPASS GRAFT     ENDARTERECTOMY Left 09/06/2019   Procedure: ENDARTERECTOMY CAROTID;  Surgeon: Annice Needy, MD;  Location: [facility] ORS;  Service: Vascular;  Laterality: Left;   EYE SURGERY Bilateral    cataract   HIP SURGERY     KIDNEY TRANSPLANT     2016    Home Medications:  Allergies as of 02/17/2023       Reactions   Oxybutynin Other (See Comments)   Incomplete bladder emptying        Medication List        Accurate as of February 17, 2023  9:26 AM. If you have any questions, ask your nurse or doctor.          amiodarone 400 MG tablet Commonly known as: Pacerone Take twice a day for 1 week and then 1 tab daily after that   ascorbic acid 500 MG tablet Commonly known as: VITAMIN C Take 500 mg by mouth daily.   aspirin EC 81 MG tablet Take 1 tablet (81 mg total) by mouth daily at 6 (six) AM.   atorvastatin 40 MG tablet Commonly known as:  LIPITOR 40 mg daily at 6 PM.   CellCept 250 MG capsule Generic drug: mycophenolate Take 500 mg by mouth 2 (two) times daily.   Eliquis 5 MG Tabs tablet Generic drug: apixaban Take 5 mg by mouth 2 (two) times daily.   finasteride 5 MG tablet Commonly known as: PROSCAR TAKE 1 TABLET BY MOUTH ONCE DAILY.   FreeStyle Libre 2 Sensor Misc by Does not apply route.   HumaLOG KwikPen 100 UNIT/ML KwikPen Generic drug: insulin lispro Inject 10-12 Units into the skin 3 (three) times daily. Inject 10u daily at breakfast, 10u at lunch and up to 12u at supper per SSI   Insulin Pen Needle 33G X 4 MM Misc To use with insulin pen 4 times per day. E 13.9   Lantus SoloStar 100 UNIT/ML Solostar Pen Generic drug: insulin  glargine Inject 24 Units into the skin at bedtime.   linagliptin 5 MG Tabs tablet Commonly known as: TRADJENTA Take 5 mg by mouth daily.   losartan 50 MG tablet Commonly known as: COZAAR Take 50 mg by mouth daily.   omeprazole 20 MG capsule Commonly known as: PRILOSEC Take 20 mg by mouth daily as needed.   Omnipod DASH Pods (Gen 4) Misc   Prograf 1 MG capsule Generic drug: tacrolimus Take 0.5 mg by mouth at bedtime.   silver sulfADIAZINE 1 % cream Commonly known as: Silvadene Apply 1 Application topically daily.   tamsulosin 0.4 MG Caps capsule Commonly known as: FLOMAX Take 0.4 mg by mouth 2 (two) times daily.   trospium 20 MG tablet Commonly known as: SANCTURA Take 1 tablet (20 mg total) by mouth at bedtime. What changed: when to take this   Vitamin D3 50 MCG (2000 UT) capsule Take 2,000 Units by mouth daily.        Allergies:  Allergies  Allergen Reactions   Oxybutynin Other (See Comments)    Incomplete bladder emptying    Family History: Family History  Problem Relation Age of Onset   Drug abuse Sister    Drug abuse Brother     Social History:   reports that he has quit smoking. He has never used smokeless tobacco. He reports that he does not drink alcohol and does not use drugs.  Physical Exam: There were no vitals taken for this visit.  Constitutional:  Alert and oriented, no acute distress, nontoxic appearing HEENT: [place], AT Cardiovascular: No clubbing, cyanosis, or edema Respiratory: Normal respiratory effort, no increased work of breathing Skin: No rashes, bruises or suspicious lesions Neurologic: Grossly intact, no focal deficits, moving all 4 extremities Psychiatric: Normal mood and affect  Laboratory Data: Results for orders placed or performed in visit on 02/17/23  BLADDER SCAN AMB NON-IMAGING  Result Value Ref Range   Scan Result 58 ml    Assessment & Plan:   1. Urinary retention Voiding trial passed.  Likely multifactorial in  the setting of constipation and acute bacterial prostatitis.  We discussed continuing Flomax, finasteride, trospium, and completing Cipro as prescribed.  Will see him back next month to repeat PVR. - BLADDER SCAN AMB NON-IMAGING  2. Constipation, unspecified constipation type Likely contributory to #1 above.  He has failed OTC medications.  I offered him a GI referral today and he accepted. - Ambulatory referral to Gastroenterology   Return in about 4 weeks (around 03/17/2023) for Symptom recheck with PVR.  Carman Ching, PA-C  [facility] [place] [address], [address] [place], [place] [address] [phone]

## 2023-02-17 NOTE — Progress Notes (Signed)
[name] presents for an office visit. BP today is __146/58_________. He is complaint with BP medication-pt states reading is better at home. Greater than 140/90. Provider  notified. Pt advised to___f/u with PCP___________. Pt voiced understanding.

## 2023-02-17 NOTE — Progress Notes (Signed)
Catheter Removal  Patient is present today for a catheter removal.  8ml of water was drained from the balloon. A 36f FR foley cath was removed from the bladder, no complications were noted. Patient tolerated well.  Performed by: Anastasiya H RMA  Follow up/ Additional notes: This afternoon

## 2023-02-22 ENCOUNTER — Ambulatory Visit: Payer: Medicare Other | Admitting: Physician Assistant

## 2023-02-23 ENCOUNTER — Ambulatory Visit: Admit: 2023-02-23 | Discharge: 2023-02-24 | Payer: MEDICARE

## 2023-02-24 DIAGNOSIS — Z940 Kidney transplant status: Principal | ICD-10-CM

## 2023-03-01 ENCOUNTER — Other Ambulatory Visit: Payer: Self-pay | Admitting: Cardiovascular Disease

## 2023-03-02 ENCOUNTER — Telehealth: Payer: Self-pay | Admitting: Podiatry

## 2023-03-02 NOTE — Telephone Encounter (Signed)
Lmom for pt to call back to schedule picking up diabetic shoes   

## 2023-03-07 ENCOUNTER — Ambulatory Visit: Admit: 2023-03-07 | Discharge: 2023-03-08 | Payer: MEDICARE

## 2023-03-07 DIAGNOSIS — Z940 Kidney transplant status: Principal | ICD-10-CM

## 2023-03-07 MED ORDER — MYCOPHENOLATE MOFETIL 250 MG CAPSULE
ORAL_CAPSULE | Freq: Two times a day (BID) | ORAL | 3 refills | 90 days | Status: CP
Start: 2023-03-07 — End: 2024-03-06

## 2023-03-11 ENCOUNTER — Ambulatory Visit: Payer: Medicare Other

## 2023-03-11 DIAGNOSIS — L97511 Non-pressure chronic ulcer of other part of right foot limited to breakdown of skin: Secondary | ICD-10-CM

## 2023-03-11 DIAGNOSIS — E1142 Type 2 diabetes mellitus with diabetic polyneuropathy: Secondary | ICD-10-CM

## 2023-03-11 NOTE — Progress Notes (Unsigned)

## 2023-03-17 ENCOUNTER — Ambulatory Visit: Payer: Medicare Other | Admitting: Physician Assistant

## 2023-03-17 VITALS — Ht 68.0 in | Wt 165.0 lb

## 2023-03-17 DIAGNOSIS — R339 Retention of urine, unspecified: Secondary | ICD-10-CM

## 2023-03-17 DIAGNOSIS — N401 Enlarged prostate with lower urinary tract symptoms: Secondary | ICD-10-CM | POA: Diagnosis not present

## 2023-03-17 DIAGNOSIS — R351 Nocturia: Secondary | ICD-10-CM

## 2023-03-17 LAB — MICROSCOPIC EXAMINATION

## 2023-03-17 LAB — URINALYSIS, COMPLETE
Bilirubin, UA: NEGATIVE
Glucose, UA: NEGATIVE
Ketones, UA: NEGATIVE
Leukocytes,UA: NEGATIVE
Nitrite, UA: NEGATIVE
Protein,UA: NEGATIVE
RBC, UA: NEGATIVE
Specific Gravity, UA: 1.02 (ref 1.005–1.030)
Urobilinogen, Ur: 2 mg/dL — ABNORMAL HIGH (ref 0.2–1.0)
pH, UA: 6 (ref 5.0–7.5)

## 2023-03-17 LAB — BLADDER SCAN AMB NON-IMAGING: Scan Result: 128

## 2023-03-17 NOTE — Progress Notes (Signed)
03/17/2023 12:07 PM   [name] Jan 26, 1956 [id number]  CC: Chief Complaint  Patient presents with   Follow-up   HPI: [name] is a 67 y.o. male with PMH diabetes, CKD s/p renal transplant, BPH with LUTS on Flomax and finasteride, nocturia on trospium, ED, chronic constipation, and recurrent urinary retention in the setting of starting Myrbetriq, acute bacterial prostatitis, and constipation who presents today for repeat PVR after passing a voiding trial in clinic with me last month.  Visit today facilitated by video Spanish language interpreter.  Today he reports he is not doing particularly well since his last clinic visit.  He continues to have intermittent sensations of difficulty urinating as well as nocturia.  He admits these are stable and have been for years.  He finished 28 days of Cipro for prostatitis 3 to 4 days ago.  He states he occasionally has some mild dysuria, but overall there have been no significant acute changes in the last month.  He remains on Flomax 0.4 mg once daily, trospium 20 mg twice daily, and finasteride 5 mg daily.  He does not think these medications are helping.  Bladder scan on arrival .  PMH: Past Medical History:  Diagnosis Date   Arthritis    Chronic kidney disease    KIDNEY TRANSPLANT   Coronary artery disease    Diabetes mellitus without complication (HCC)    Dyspnea    GERD (gastroesophageal reflux disease)    History of blood transfusion    Hypertension    Myocardial infarction [facility])     Surgical History: Past Surgical History:  Procedure Laterality Date   CATARACT EXTRACTION W/PHACO Right 02/03/2017   Procedure: CATARACT EXTRACTION PHACO AND INTRAOCULAR LENS PLACEMENT (IOC);  Surgeon: Lockie Mola, MD;  Location: [facility] ORS;  Service: Ophthalmology;  Laterality: Right;  [place] 00:35.8 AP% 12.8 CDE 4.57 Fluid lot # [id number]   COLONOSCOPY WITH PROPOFOL N/A 12/30/2021   Procedure: COLONOSCOPY WITH PROPOFOL;   Surgeon: Wyline Mood, MD;  Location: [facility] ENDOSCOPY;  Service: Gastroenterology;  Laterality: N/A;   CORONARY ANGIOPLASTY     STENTS X 4   CORONARY ARTERY BYPASS GRAFT     ENDARTERECTOMY Left 09/06/2019   Procedure: ENDARTERECTOMY CAROTID;  Surgeon: Annice Needy, MD;  Location: [facility] ORS;  Service: Vascular;  Laterality: Left;   EYE SURGERY Bilateral    cataract   HIP SURGERY     KIDNEY TRANSPLANT     2016    Home Medications:  Allergies as of 03/17/2023       Reactions   Oxybutynin Other (See Comments)   Incomplete bladder emptying        Medication List        Accurate as of March 17, 2023 12:07 PM. If you have any questions, ask your nurse or doctor.          STOP taking these medications    silver sulfADIAZINE 1 % cream Commonly known as: Silvadene   Vitamin D3 50 MCG (2000 UT) capsule       TAKE these medications    aspirin EC 81 MG tablet Take 1 tablet (81 mg total) by mouth daily at 6 (six) AM.   atorvastatin 40 MG tablet Commonly known as: LIPITOR 40 mg daily at 6 PM.   CellCept 250 MG capsule Generic drug: mycophenolate Take 500 mg by mouth 2 (two) times daily.   Eliquis 5 MG Tabs tablet Generic drug: apixaban Take 5 mg by mouth 2 (two) times  daily.   finasteride 5 MG tablet Commonly known as: PROSCAR TAKE 1 TABLET BY MOUTH ONCE DAILY.   FreeStyle Libre 2 Sensor Misc by Does not apply route.   HumaLOG KwikPen 100 UNIT/ML KwikPen Generic drug: insulin lispro Inject 10-12 Units into the skin 3 (three) times daily. Inject 10u daily at breakfast, 10u at lunch and up to 12u at supper per SSI   Insulin Pen Needle 33G X 4 MM Misc To use with insulin pen 4 times per day. E 13.9   Lantus SoloStar 100 UNIT/ML Solostar Pen Generic drug: insulin glargine Inject 24 Units into the skin at bedtime.   linagliptin 5 MG Tabs tablet Commonly known as: TRADJENTA Take 5 mg by mouth daily.   losartan 50 MG tablet Commonly known as: COZAAR Take 50 mg  by mouth daily.   omeprazole 20 MG capsule Commonly known as: PRILOSEC Take 20 mg by mouth daily as needed.   Omnipod DASH Pods (Gen 4) Misc   Prograf 1 MG capsule Generic drug: tacrolimus Take 0.5 mg by mouth at bedtime.   tamsulosin 0.4 MG Caps capsule Commonly known as: FLOMAX Take 0.4 mg by mouth 2 (two) times daily.   trospium 20 MG tablet Commonly known as: SANCTURA Take 1 tablet (20 mg total) by mouth at bedtime. What changed: when to take this        Allergies:  Allergies  Allergen Reactions   Oxybutynin Other (See Comments)    Incomplete bladder emptying    Family History: Family History  Problem Relation Age of Onset   Drug abuse Sister    Drug abuse Brother     Social History:   reports that he has quit smoking. He has never used smokeless tobacco. He reports that he does not drink alcohol and does not use drugs.  Physical Exam: Ht 5\' 8"  (1.727 m)   Wt 165 lb (74.8 kg)   BMI 25.09 kg/m   Constitutional:  Alert and oriented, no acute distress, nontoxic appearing HEENT: [place], AT Cardiovascular: No clubbing, cyanosis, or edema Respiratory: Normal respiratory effort, no increased work of breathing Skin: No rashes, bruises or suspicious lesions Neurologic: Grossly intact, no focal deficits, moving all 4 extremities Psychiatric: Normal mood and affect  Laboratory Data: Results for orders placed or performed in visit on 03/17/23  Microscopic Examination   Urine  Result Value Ref Range   WBC, UA 0-5 0 - 5 /hpf   RBC, Urine 0-2 0 - 2 /hpf   Epithelial Cells (non renal) 0-10 0 - 10 /hpf   Casts Present (A) None seen /lpf   Cast Type Granular casts (A) N/A   Bacteria, UA Few None seen/Few  Urinalysis, Complete  Result Value Ref Range   Specific Gravity, UA 1.020 1.005 - 1.030   pH, UA 6.0 5.0 - 7.5   Color, UA Yellow Yellow   Appearance Ur Clear Clear   Leukocytes,UA Negative Negative   Protein,UA Negative Negative/Trace   Glucose, UA Negative  Negative   Ketones, UA Negative Negative   RBC, UA Negative Negative   Bilirubin, UA Negative Negative   Urobilinogen, Ur 2.0 (H) 0.2 - 1.0 mg/dL   Nitrite, UA Negative Negative   Microscopic Examination See below:   BLADDER SCAN AMB NON-IMAGING  Result Value Ref Range   Scan Result 128 ml    Assessment & Plan:   1. Urinary retention Resolved, PVR WNL today.  I asked him to leave a urine specimen on its way out today  to rule out persistent bacterial prostatitis.  He was unable to provide a specimen, but will bring one from home when he is able to void. - BLADDER SCAN AMB NON-IMAGING - Urinalysis, Complete  2. Benign prostatic hyperplasia with nocturia He had a nonobstructive prostate on cystoscopy with Dr. Apolinar Junes 2 years ago.  With him being on finasteride, I think there is unlikely to be significant interval change.  At this point, I recommended pursuing urodynamics and he is in agreement.  I asked him to stop trospium and advance of the test and he agreed. He would like [facility] to contact his daughter for scheduling but does not have her phone number on him today; he will bring this back to clinic later.  Return in about 2 months (around 05/17/2023) for UDS results with Dr. Apolinar Junes.  Carman Ching, PA-C  [facility] [place] [address] [place], [place] [address] [phone]

## 2023-03-24 ENCOUNTER — Encounter: Payer: Self-pay | Admitting: Cardiovascular Disease

## 2023-03-24 ENCOUNTER — Ambulatory Visit: Payer: Medicare Other | Admitting: Cardiovascular Disease

## 2023-03-24 VITALS — BP 130/65 | HR 58 | Ht 68.0 in | Wt 162.4 lb

## 2023-03-24 DIAGNOSIS — I10 Essential (primary) hypertension: Secondary | ICD-10-CM | POA: Diagnosis not present

## 2023-03-24 DIAGNOSIS — I6522 Occlusion and stenosis of left carotid artery: Secondary | ICD-10-CM | POA: Diagnosis not present

## 2023-03-24 DIAGNOSIS — I480 Paroxysmal atrial fibrillation: Secondary | ICD-10-CM

## 2023-03-24 DIAGNOSIS — D849 Immunodeficiency, unspecified: Secondary | ICD-10-CM

## 2023-03-24 DIAGNOSIS — E7800 Pure hypercholesterolemia, unspecified: Secondary | ICD-10-CM

## 2023-03-24 DIAGNOSIS — I25708 Atherosclerosis of coronary artery bypass graft(s), unspecified, with other forms of angina pectoris: Secondary | ICD-10-CM | POA: Diagnosis not present

## 2023-03-24 NOTE — Progress Notes (Signed)
Cardiology Office Note   Date:  03/24/2023   ID:  [name], [name] March 29, 1956, MRN [id number]  PCP:  Abram Sander, MD  Cardiologist:  Adrian Blackwater, MD      History of Present Illness: [name] is a 67 y.o. male who presents for  Chief Complaint  Patient presents with   Follow-up    1 mo    Doing well      Past Medical History:  Diagnosis Date   Arthritis    Chronic kidney disease    KIDNEY TRANSPLANT   Coronary artery disease    Diabetes mellitus without complication (HCC)    Dyspnea    GERD (gastroesophageal reflux disease)    History of blood transfusion    Hypertension    Myocardial infarction [facility])      Past Surgical History:  Procedure Laterality Date   CATARACT EXTRACTION W/PHACO Right 02/03/2017   Procedure: CATARACT EXTRACTION PHACO AND INTRAOCULAR LENS PLACEMENT (IOC);  Surgeon: Lockie Mola, MD;  Location: [facility] ORS;  Service: Ophthalmology;  Laterality: Right;  [place] 00:35.8 AP% 12.8 CDE 4.57 Fluid lot # [id number]   COLONOSCOPY WITH PROPOFOL N/A 12/30/2021   Procedure: COLONOSCOPY WITH PROPOFOL;  Surgeon: Wyline Mood, MD;  Location: [facility] ENDOSCOPY;  Service: Gastroenterology;  Laterality: N/A;   CORONARY ANGIOPLASTY     STENTS X 4   CORONARY ARTERY BYPASS GRAFT     ENDARTERECTOMY Left 09/06/2019   Procedure: ENDARTERECTOMY CAROTID;  Surgeon: Annice Needy, MD;  Location: [facility] ORS;  Service: Vascular;  Laterality: Left;   EYE SURGERY Bilateral    cataract   HIP SURGERY     KIDNEY TRANSPLANT     2016     Current Outpatient Medications  Medication Sig Dispense Refill   aspirin EC 81 MG EC tablet Take 1 tablet (81 mg total) by mouth daily at 6 (six) AM.     atorvastatin (LIPITOR) 40 MG tablet 40 mg daily at 6 PM.      CELLCEPT 250 MG capsule Take 500 mg by mouth 2 (two) times daily.     Continuous Blood Gluc Sensor (FREESTYLE LIBRE 2 SENSOR) MISC by Does not apply route.     ELIQUIS 5 MG TABS tablet Take 5 mg by mouth 2  (two) times daily.      finasteride (PROSCAR) 5 MG tablet TAKE 1 TABLET BY MOUTH ONCE DAILY. 90 tablet 0   HUMALOG KWIKPEN 100 UNIT/ML KiwkPen Inject 10-12 Units into the skin 3 (three) times daily. Inject 10u daily at breakfast, 10u at lunch and up to 12u at supper per SSI     Insulin Disposable Pump (OMNIPOD DASH 5 PACK PODS) MISC      Insulin Pen Needle 33G X 4 MM MISC To use with insulin pen 4 times per day. E 13.9     LANTUS SOLOSTAR 100 UNIT/ML Solostar Pen Inject 24 Units into the skin at bedtime.      linagliptin (TRADJENTA) 5 MG TABS tablet Take 5 mg by mouth daily.     losartan (COZAAR) 50 MG tablet Take 50 mg by mouth daily.     omeprazole (PRILOSEC) 20 MG capsule Take 20 mg by mouth daily as needed.      PROGRAF 1 MG capsule Take 0.5 mg by mouth at bedtime.     tamsulosin (FLOMAX) 0.4 MG CAPS capsule Take 0.4 mg by mouth 2 (two) times daily.      trospium (SANCTURA) 20 MG tablet Take 1  tablet (20 mg total) by mouth at bedtime. (Patient taking differently: Take 20 mg by mouth in the morning and at bedtime.) 30 tablet 3   No current facility-administered medications for this visit.    Allergies:   Oxybutynin    Social History:   reports that he has quit smoking. He has never used smokeless tobacco. He reports that he does not drink alcohol and does not use drugs.   Family History:  family history includes Drug abuse in his brother and sister.    ROS:     Review of Systems  Constitutional: Negative.   HENT: Negative.    Eyes: Negative.   Respiratory: Negative.    Gastrointestinal: Negative.   Genitourinary: Negative.   Musculoskeletal: Negative.   Skin: Negative.   Neurological: Negative.   Endo/Heme/Allergies: Negative.   Psychiatric/Behavioral: Negative.    All other systems reviewed and are negative.     All other systems are reviewed and negative.    PHYSICAL EXAM: VS:  BP 130/65   Pulse (!) 58   Ht 5\' 8"  (1.727 m)   Wt 162 lb 6.4 oz (73.7 kg)   SpO2 96%    BMI 24.69 kg/m  , BMI Body mass index is 24.69 kg/m. Last weight:  Wt Readings from Last 3 Encounters:  03/24/23 162 lb 6.4 oz (73.7 kg)  03/17/23 165 lb (74.8 kg)  02/17/23 165 lb (74.8 kg)     Physical Exam Vitals reviewed.  Constitutional:      Appearance: Normal appearance. He is normal weight.  HENT:     Head: Normocephalic.     Nose: Nose normal.     Mouth/Throat:     Mouth: Mucous membranes are moist.  Eyes:     Pupils: Pupils are equal, round, and reactive to light.  Cardiovascular:     Rate and Rhythm: Normal rate and regular rhythm.     Pulses: Normal pulses.     Heart sounds: Normal heart sounds.  Pulmonary:     Effort: Pulmonary effort is normal.  Abdominal:     General: Abdomen is flat. Bowel sounds are normal.  Musculoskeletal:        General: Normal range of motion.     Cervical back: Normal range of motion.  Skin:    General: Skin is warm.  Neurological:     General: No focal deficit present.     Mental Status: He is alert.  Psychiatric:        Mood and Affect: Mood normal.       EKG:   Recent Labs: No results found for requested labs within last 365 days.    Lipid Panel    Component Value Date/Time   CHOL  10/20/2010 0400    94        ATP III CLASSIFICATION:  <200     mg/dL   Desirable  016-010  mg/dL   Borderline High  >=932    mg/dL   High          TRIG 355 (H) 10/23/2010 0600      Other studies Reviewed: Additional studies/ records that were reviewed today include:  Review of the above records demonstrates:       No data to display            ASSESSMENT AND PLAN:    ICD-10-CM   1. Paroxysmal atrial fibrillation (HCC)  I48.0    rate well controlled    2. Carotid stenosis, left  I65.22  3. Coronary artery disease involving coronary bypass graft of native heart with other forms of angina pectoris (HCC)  I25.708     4. Essential (primary) hypertension  I10    stable    5. Immunosuppression (HCC)  D84.9      6. Hypercholesteremia  E78.00        Problem List Items Addressed This Visit       Cardiovascular and Mediastinum   Essential (primary) hypertension   Coronary artery disease   Paroxysmal atrial fibrillation (HCC) - Primary   Carotid stenosis, left     Other   Hypercholesteremia   Immunosuppression (HCC)       Disposition:   Return in about 3 months (around 06/24/2023).    Total time spent: 30 minutes  Signed,  Adrian Blackwater, MD  03/24/2023 9:19 AM    [facility]

## 2023-04-15 ENCOUNTER — Other Ambulatory Visit: Payer: Medicare Other

## 2023-04-19 ENCOUNTER — Ambulatory Visit: Payer: Medicare Other | Admitting: Urology

## 2023-04-19 ENCOUNTER — Other Ambulatory Visit: Payer: Medicare Other

## 2023-05-11 ENCOUNTER — Ambulatory Visit: Payer: Medicare Other | Admitting: Urology

## 2023-05-11 VITALS — BP 157/65 | HR 67 | Ht 68.0 in | Wt 165.0 lb

## 2023-05-11 DIAGNOSIS — Z01818 Encounter for other preprocedural examination: Secondary | ICD-10-CM

## 2023-05-11 DIAGNOSIS — N401 Enlarged prostate with lower urinary tract symptoms: Secondary | ICD-10-CM | POA: Diagnosis not present

## 2023-05-11 DIAGNOSIS — R351 Nocturia: Secondary | ICD-10-CM

## 2023-05-11 LAB — URINALYSIS, COMPLETE
Bilirubin, UA: NEGATIVE
Ketones, UA: NEGATIVE
Leukocytes,UA: NEGATIVE
Nitrite, UA: NEGATIVE
Protein,UA: NEGATIVE
RBC, UA: NEGATIVE
Specific Gravity, UA: 1.02 (ref 1.005–1.030)
Urobilinogen, Ur: 2 mg/dL — ABNORMAL HIGH (ref 0.2–1.0)
pH, UA: 7 (ref 5.0–7.5)

## 2023-05-11 LAB — MICROSCOPIC EXAMINATION

## 2023-05-11 NOTE — Progress Notes (Signed)
[name],acting as a scribe for Leonard Scotland, MD.,have documented all relevant documentation on the behalf of Leonard Scotland, MD,as directed by  Leonard Scotland, MD while in the presence of Leonard Scotland, MD.  05/11/2023 12:18 PM   [name] [birth year] [id number]  Referring provider: Abram Sander, MD [address] [place],  [place] [address]  Chief Complaint  Patient presents with   Results    UDS    HPI: 67 year-old male with ongoing urinary symptoms despite maximum medical therapy.   In the past, he underwent cysto/TRUS in 2022 that showed a relatively non-obstructing appearing prostate and at the time, his TRUS volume was 43. He has been managed on multiple medications, most recently on Flomax, finasteride, and Sanctura, and has refractory symptoms. He also developed an episode of urinary retention after starting myrbetriq.  He returns today for follow up with urodynamics, completed at [facility] on 04/22/2023. He had a maximum bladder capacity of 791. His first sensation was 371. He had no instability noted. He was able to generate a voluntary contraction and void 34 mLs and a max flow rate of 6 mL/sec. Max detrusor pressure while voiding was 32. He had no evidence of EMG activity during voiding phase. His PVR was 756. He did have some slight elevation of the bladder base without trabeculation or reflux.   A spanish translator was utilized for the course of this appointment.   PMH: Past Medical History:  Diagnosis Date   Arthritis    Chronic kidney disease    KIDNEY TRANSPLANT   Coronary artery disease    Diabetes mellitus without complication (HCC)    Dyspnea    GERD (gastroesophageal reflux disease)    History of blood transfusion    Hypertension    Myocardial infarction [facility])     Surgical History: Past Surgical History:  Procedure Laterality Date   CATARACT EXTRACTION W/PHACO Right 02/03/2017   Procedure: CATARACT EXTRACTION PHACO AND  INTRAOCULAR LENS PLACEMENT (IOC);  Surgeon: Lockie Mola, MD;  Location: [facility] ORS;  Service: Ophthalmology;  Laterality: Right;  [place] 00:35.8 AP% 12.8 CDE 4.57 Fluid lot # [id number]   COLONOSCOPY WITH PROPOFOL N/A 12/30/2021   Procedure: COLONOSCOPY WITH PROPOFOL;  Surgeon: Wyline Mood, MD;  Location: [facility] ENDOSCOPY;  Service: Gastroenterology;  Laterality: N/A;   CORONARY ANGIOPLASTY     STENTS X 4   CORONARY ARTERY BYPASS GRAFT     ENDARTERECTOMY Left 09/06/2019   Procedure: ENDARTERECTOMY CAROTID;  Surgeon: Annice Needy, MD;  Location: [facility] ORS;  Service: Vascular;  Laterality: Left;   EYE SURGERY Bilateral    cataract   HIP SURGERY     KIDNEY TRANSPLANT     2016    Home Medications:  Allergies as of 05/11/2023       Reactions   Oxybutynin Other (See Comments)   Incomplete bladder emptying        Medication List        Accurate as of May 11, 2023 12:18 PM. If you have any questions, ask your nurse or doctor.          aspirin EC 81 MG tablet Take 1 tablet (81 mg total) by mouth daily at 6 (six) AM.   atorvastatin 40 MG tablet Commonly known as: LIPITOR 40 mg daily at 6 PM.   CellCept 250 MG capsule Generic drug: mycophenolate Take 500 mg by mouth 2 (two) times daily.   Eliquis 5 MG Tabs tablet Generic drug: apixaban  Take 5 mg by mouth 2 (two) times daily.   finasteride 5 MG tablet Commonly known as: PROSCAR TAKE 1 TABLET BY MOUTH ONCE DAILY.   FreeStyle Libre 2 Sensor Misc by Does not apply route.   HumaLOG KwikPen 100 UNIT/ML KwikPen Generic drug: insulin lispro Inject 10-12 Units into the skin 3 (three) times daily. Inject 10u daily at breakfast, 10u at lunch and up to 12u at supper per SSI   Insulin Pen Needle 33G X 4 MM Misc To use with insulin pen 4 times per day. E 13.9   Lantus SoloStar 100 UNIT/ML Solostar Pen Generic drug: insulin glargine Inject 24 Units into the skin at bedtime.   linagliptin 5 MG Tabs tablet Commonly known as:  TRADJENTA Take 5 mg by mouth daily.   losartan 50 MG tablet Commonly known as: COZAAR Take 50 mg by mouth daily.   omeprazole 20 MG capsule Commonly known as: PRILOSEC Take 20 mg by mouth daily as needed.   Omnipod DASH Pods (Gen 4) Misc   Prograf 1 MG capsule Generic drug: tacrolimus Take 0.5 mg by mouth at bedtime.   tamsulosin 0.4 MG Caps capsule Commonly known as: FLOMAX Take 0.4 mg by mouth 2 (two) times daily.   trospium 20 MG tablet Commonly known as: SANCTURA Take 1 tablet (20 mg total) by mouth at bedtime. What changed: when to take this        Allergies:  Allergies  Allergen Reactions   Oxybutynin Other (See Comments)    Incomplete bladder emptying    Family History: Family History  Problem Relation Age of Onset   Drug abuse Sister    Drug abuse Brother     Social History:  reports that he has quit smoking. He has never used smokeless tobacco. He reports that he does not drink alcohol and does not use drugs.   Physical Exam: BP (!) 157/65   Pulse 67   Ht 5\' 8"  (1.727 m)   Wt 165 lb (74.8 kg)   BMI 25.09 kg/m   Constitutional:  Alert and oriented, No acute distress. HEENT: [place] AT, moist mucus membranes.  Trachea midline, no masses. Neurologic: Grossly intact, no focal deficits, moving all 4 extremities. Psychiatric: Normal mood and affect.   Assessment & Plan:    1. BPH with outlet obstruction - His pressure flow is somewhat limited, given that he was barely able to urinate - Given his refractory ongoing urinary symptoms, we think it is reasonable to try an outlet procedure - Discussed UroLift versus TURP and risks and benefits of each - He is considering UroLift due to its favorable side effect profile compared to other surgical options. Provided literature for further understanding.  One of his primary concerns is postoperative urinary incontinence, with UroLift is extremely low. - Possible side effects of UroLift include blood in urine,  infection, urinary retention, or a complication with the clip.  - Scheduled for a pre-op urinalysis and culture   Return for UroLift.  I have reviewed the above documentation for accuracy and completeness, and I agree with the above.   Leonard Scotland, MD  [facility] [address], [address] [place], [place] [address] [phone]

## 2023-05-12 ENCOUNTER — Encounter: Payer: Self-pay | Admitting: Urology

## 2023-05-12 ENCOUNTER — Other Ambulatory Visit: Payer: Self-pay

## 2023-05-12 DIAGNOSIS — N401 Enlarged prostate with lower urinary tract symptoms: Secondary | ICD-10-CM

## 2023-05-12 NOTE — Progress Notes (Signed)
Surgical Physician Order Form [facility]  Dr. [name], MD  * Scheduling expectation : Next Available  *Length of Case:   *Clearance needed: no  *Anticoagulation Instructions: Hold all anticoagulants  *Aspirin Instructions: Ok to continue Aspirin  *Post-op visit Date/Instructions:  6 weeks ipss/ pv  *Diagnosis: BPH w/urinary obstruction  *Procedure:  Urolift   Additional orders: N/A  -Admit type: OUTpatient  -Anesthesia: MAC  -VTE Prophylaxis Standing Order SCD's       Other:   -Standing Lab Orders Per Anesthesia    Lab other: None  -Standing Test orders EKG/Chest x-ray per Anesthesia       Test other:   - Medications:  Ancef 2gm IV  -Other orders:  N/A

## 2023-05-13 ENCOUNTER — Telehealth: Payer: Self-pay

## 2023-05-13 NOTE — Progress Notes (Signed)
  Phone Number: [phone] for Surgical Coordinator Fax Number: [phone]  REQUEST FOR SURGICAL CLEARANCE     Date: Date: 05/13/23  Faxed to: Dr. Welton Flakes, MD  Surgeon: Dr. [name], MD     Date of Surgery: 06/13/2023  Operation: Cystoscopy with Insertion of Urolift  Anesthesia Type: MAC   Diagnosis: Benign Prostatic Hyperplasia with Urinary Obstruction  Patient Requires:   Cardiac / Vascular Clearance : Yes  Reason: Will need patient to hold Eliquis prior to surgery.    Risk Assessment:    Low   []       Moderate   []     High   []           This patient is optimized for surgery  YES []       NO   []    I recommend further assessment/workup prior to surgery. YES []      NO  []   Appointment scheduled for: _______________________   Further recommendations: ____________________________________     Physician Signature:__________________________________   Printed Name: ________________________________________   Date: _________________

## 2023-05-13 NOTE — Progress Notes (Signed)
    [facility] Surgical Posting Form  Surgery Date: Date: 06/13/2023  Surgeon: Dr. [name], MD  Inpt ( No  )   Outpt (Yes)   Obs ( No  )   Diagnosis: N40.1, N13.8 Benign Prostatic Hyperplasia with Urinary Obstruction  -CPT: 52441, [phone]  Surgery: Cystoscopy with Insertion of Urolift  Stop Anticoagulations: Yes, may continue ASA  Cardiac/Medical/Pulmonary Clearance needed: yes  Clearance needed from Dr: Welton Flakes  Clearance request sent on: Date: 05/13/23  *Orders entered into EPIC  Date: 05/13/23   *Case booked in EPIC  Date: 05/12/2023  *Notified pt of Surgery: Date: 05/12/2023  PRE-OP UA & CX: no  *Placed into Prior Authorization Work [place] Date: 05/13/23  Assistant/laser/rep:No

## 2023-05-13 NOTE — Telephone Encounter (Signed)
Per Dr. Apolinar Junes, Patient is to be scheduled for Cystoscopy with Insertion of Urolift   Mr. Crooker was contacted and possible surgical dates were discussed, Monday November 11th, 2024 was agreed upon for surgery.   Patient was directed to call [phone] between 1-3pm the day before surgery to find out surgical arrival time.  Instructions were given not to eat or drink from midnight on the night before surgery and have a driver for the day of surgery. On the surgery day patient was instructed to enter through the [facility] entrance of [facility] report the Same Day Surgery desk.   Pre-Admit Testing will be in contact via phone to set up an interview with the anesthesia team to review your history and medications prior to surgery.   Reminder of this information was sent via MyChart to the patient.   Patient is to hold anticoag's per Dr. Apolinar Junes.  Currently taking Eliquis by Dr. Welton Flakes. Clearance requested to stop 2days prior to surgery, clearance form was faxed to Dr. Welton Flakes.  Awaiting response

## 2023-05-16 LAB — CULTURE, URINE COMPREHENSIVE

## 2023-05-19 ENCOUNTER — Ambulatory Visit: Payer: Medicare Other | Admitting: Cardiovascular Disease

## 2023-05-19 ENCOUNTER — Encounter: Payer: Self-pay | Admitting: Cardiovascular Disease

## 2023-05-19 VITALS — BP 110/60 | HR 66 | Ht 68.0 in | Wt 166.6 lb

## 2023-05-19 DIAGNOSIS — I25708 Atherosclerosis of coronary artery bypass graft(s), unspecified, with other forms of angina pectoris: Secondary | ICD-10-CM

## 2023-05-19 DIAGNOSIS — I10 Essential (primary) hypertension: Secondary | ICD-10-CM

## 2023-05-19 DIAGNOSIS — I480 Paroxysmal atrial fibrillation: Secondary | ICD-10-CM

## 2023-05-19 DIAGNOSIS — I6522 Occlusion and stenosis of left carotid artery: Secondary | ICD-10-CM

## 2023-05-19 DIAGNOSIS — Z940 Kidney transplant status: Secondary | ICD-10-CM

## 2023-05-19 NOTE — Progress Notes (Signed)
Cardiology Office Note   Date:  05/19/2023   ID:  [name], DOB 16-Oct-1955, MRN [id number]  PCP:  Abram Sander, MD  Cardiologist:  Adrian Blackwater, MD      History of Present Illness: [name] is a 67 y.o. male who presents for  Chief Complaint  Patient presents with   Follow-up    Surgical clearance    Doing well      Past Medical History:  Diagnosis Date   Arthritis    Chronic kidney disease    KIDNEY TRANSPLANT   Coronary artery disease    Diabetes mellitus without complication (HCC)    Dyspnea    GERD (gastroesophageal reflux disease)    History of blood transfusion    Hypertension    Myocardial infarction [facility])      Past Surgical History:  Procedure Laterality Date   CATARACT EXTRACTION W/PHACO Right 02/03/2017   Procedure: CATARACT EXTRACTION PHACO AND INTRAOCULAR LENS PLACEMENT (IOC);  Surgeon: Lockie Mola, MD;  Location: [facility] ORS;  Service: Ophthalmology;  Laterality: Right;  [place] 00:35.8 AP% 12.8 CDE 4.57 Fluid lot # [id number]   COLONOSCOPY WITH PROPOFOL N/A 12/30/2021   Procedure: COLONOSCOPY WITH PROPOFOL;  Surgeon: Wyline Mood, MD;  Location: [facility] ENDOSCOPY;  Service: Gastroenterology;  Laterality: N/A;   CORONARY ANGIOPLASTY     STENTS X 4   CORONARY ARTERY BYPASS GRAFT     ENDARTERECTOMY Left 09/06/2019   Procedure: ENDARTERECTOMY CAROTID;  Surgeon: Annice Needy, MD;  Location: [facility] ORS;  Service: Vascular;  Laterality: Left;   EYE SURGERY Bilateral    cataract   HIP SURGERY     KIDNEY TRANSPLANT     2016     Current Outpatient Medications  Medication Sig Dispense Refill   aspirin EC 81 MG EC tablet Take 1 tablet (81 mg total) by mouth daily at 6 (six) AM.     atorvastatin (LIPITOR) 40 MG tablet 40 mg daily at 6 PM.      CELLCEPT 250 MG capsule Take 500 mg by mouth 2 (two) times daily.     Continuous Blood Gluc Sensor (FREESTYLE LIBRE 2 SENSOR) MISC by Does not apply route.     ELIQUIS 5 MG TABS tablet Take 5 mg  by mouth 2 (two) times daily.      finasteride (PROSCAR) 5 MG tablet TAKE 1 TABLET BY MOUTH ONCE DAILY. 90 tablet 0   HUMALOG KWIKPEN 100 UNIT/ML KiwkPen Inject 10-12 Units into the skin 3 (three) times daily. Inject 10u daily at breakfast, 10u at lunch and up to 12u at supper per SSI     Insulin Disposable Pump (OMNIPOD DASH 5 PACK PODS) MISC      Insulin Pen Needle 33G X 4 MM MISC To use with insulin pen 4 times per day. E 13.9     LANTUS SOLOSTAR 100 UNIT/ML Solostar Pen Inject 24 Units into the skin at bedtime.      linagliptin (TRADJENTA) 5 MG TABS tablet Take 5 mg by mouth daily.     losartan (COZAAR) 50 MG tablet Take 50 mg by mouth daily.     omeprazole (PRILOSEC) 20 MG capsule Take 20 mg by mouth daily as needed.      PROGRAF 1 MG capsule Take 0.5 mg by mouth at bedtime.     tamsulosin (FLOMAX) 0.4 MG CAPS capsule Take 0.4 mg by mouth 2 (two) times daily.      trospium (SANCTURA) 20 MG tablet Take 1  tablet (20 mg total) by mouth at bedtime. (Patient taking differently: Take 20 mg by mouth in the morning and at bedtime.) 30 tablet 3   No current facility-administered medications for this visit.    Allergies:   Oxybutynin    Social History:   reports that he has quit smoking. He has never used smokeless tobacco. He reports that he does not drink alcohol and does not use drugs.   Family History:  family history includes Drug abuse in his brother and sister.    ROS:     Review of Systems  Constitutional: Negative.   HENT: Negative.    Eyes: Negative.   Respiratory: Negative.    Gastrointestinal: Negative.   Genitourinary: Negative.   Musculoskeletal: Negative.   Skin: Negative.   Neurological: Negative.   Endo/Heme/Allergies: Negative.   Psychiatric/Behavioral: Negative.    All other systems reviewed and are negative.     All other systems are reviewed and negative.    PHYSICAL EXAM: VS:  BP 110/60   Pulse 66   Ht 5\' 8"  (1.727 m)   Wt 166 lb 9.6 oz (75.6 kg)    SpO2 99%   BMI 25.33 kg/m  , BMI Body mass index is 25.33 kg/m. Last weight:  Wt Readings from Last 3 Encounters:  05/19/23 166 lb 9.6 oz (75.6 kg)  05/11/23 165 lb (74.8 kg)  03/24/23 162 lb 6.4 oz (73.7 kg)     Physical Exam Vitals reviewed.  Constitutional:      Appearance: Normal appearance. He is normal weight.  HENT:     Head: Normocephalic.     Nose: Nose normal.     Mouth/Throat:     Mouth: Mucous membranes are moist.  Eyes:     Pupils: Pupils are equal, round, and reactive to light.  Cardiovascular:     Rate and Rhythm: Normal rate and regular rhythm.     Pulses: Normal pulses.     Heart sounds: Normal heart sounds.  Pulmonary:     Effort: Pulmonary effort is normal.  Abdominal:     General: Abdomen is flat. Bowel sounds are normal.  Musculoskeletal:        General: Normal range of motion.     Cervical back: Normal range of motion.  Skin:    General: Skin is warm.  Neurological:     General: No focal deficit present.     Mental Status: He is alert.  Psychiatric:        Mood and Affect: Mood normal.       EKG:   Recent Labs: No results found for requested labs within last 365 days.    Lipid Panel    Component Value Date/Time   CHOL  10/20/2010 0400    94        ATP III CLASSIFICATION:  <200     mg/dL   Desirable  010-272  mg/dL   Borderline High  >=536    mg/dL   High          TRIG 644 (H) 10/23/2010 0600      Other studies Reviewed: Additional studies/ records that were reviewed today include:  Review of the above records demonstrates:       No data to display            ASSESSMENT AND PLAN:    ICD-10-CM   1. Paroxysmal atrial fibrillation (HCC)  I48.0    doing well    2. Carotid stenosis, left  I65.22  3. Coronary artery disease involving coronary bypass graft of native heart with other forms of angina pectoris (HCC)  I25.708     4. Essential (primary) hypertension  I10     5. History of renal transplant  Z94.0         Problem List Items Addressed This Visit       Cardiovascular and Mediastinum   Essential (primary) hypertension   Coronary artery disease   Paroxysmal atrial fibrillation (HCC) - Primary   Carotid stenosis, left     Other   History of renal transplant       Disposition:   Return in about 4 months (around 09/19/2023).    Total time spent: 30 minutes  Signed,  Adrian Blackwater, MD  05/19/2023 10:20 AM    [facility]

## 2023-05-26 ENCOUNTER — Other Ambulatory Visit (INDEPENDENT_AMBULATORY_CARE_PROVIDER_SITE_OTHER): Payer: Self-pay | Admitting: Vascular Surgery

## 2023-05-26 DIAGNOSIS — I6523 Occlusion and stenosis of bilateral carotid arteries: Secondary | ICD-10-CM

## 2023-05-26 DIAGNOSIS — N186 End stage renal disease: Secondary | ICD-10-CM

## 2023-05-31 ENCOUNTER — Encounter: Payer: Self-pay | Admitting: Podiatry

## 2023-05-31 ENCOUNTER — Ambulatory Visit (INDEPENDENT_AMBULATORY_CARE_PROVIDER_SITE_OTHER): Payer: Medicare Other

## 2023-05-31 ENCOUNTER — Encounter (INDEPENDENT_AMBULATORY_CARE_PROVIDER_SITE_OTHER): Payer: Self-pay | Admitting: Vascular Surgery

## 2023-05-31 ENCOUNTER — Encounter: Payer: Self-pay | Admitting: Urology

## 2023-05-31 ENCOUNTER — Ambulatory Visit (INDEPENDENT_AMBULATORY_CARE_PROVIDER_SITE_OTHER): Payer: Medicare Other | Admitting: Vascular Surgery

## 2023-05-31 VITALS — BP 116/59 | HR 60 | Resp 16 | Wt 161.0 lb

## 2023-05-31 DIAGNOSIS — I6523 Occlusion and stenosis of bilateral carotid arteries: Secondary | ICD-10-CM

## 2023-05-31 DIAGNOSIS — I10 Essential (primary) hypertension: Secondary | ICD-10-CM

## 2023-05-31 DIAGNOSIS — N186 End stage renal disease: Secondary | ICD-10-CM | POA: Diagnosis not present

## 2023-05-31 DIAGNOSIS — E7800 Pure hypercholesterolemia, unspecified: Secondary | ICD-10-CM | POA: Diagnosis not present

## 2023-05-31 DIAGNOSIS — E1159 Type 2 diabetes mellitus with other circulatory complications: Secondary | ICD-10-CM | POA: Diagnosis not present

## 2023-05-31 DIAGNOSIS — T829XXD Unspecified complication of cardiac and vascular prosthetic device, implant and graft, subsequent encounter: Secondary | ICD-10-CM

## 2023-05-31 NOTE — Progress Notes (Signed)
MRN : [id number]  [name] is a 67 y.o. (08-27-1955) male who presents with chief complaint of  Chief Complaint  Patient presents with   Follow-up    Ultrasound follow up  .  History of Present Illness: Patient returns today in follow up of his carotid artery disease as well as to check his AV fistula.  He has had this fistula for about 13 years.  He used it for 6 or 7 years before he got a kidney transplant which she is now had for over 6 years.  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  A duplex was done today as it had not been assessed in many years and there is some mild narrowing near the cephalic vein subclavian vein confluence portion of the cephalic vein but this appears moderate and the remainder of the fistula appears widely patent. He is also followed for his carotid disease.  He is about 3 to 4 years status post left carotid endarterectomy for high-grade stenosis.  He denies any focal neurologic symptoms since his last visit. Specifically, the patient denies amaurosis fugax, speech or swallowing difficulties, or arm or leg weakness or numbness. Carotid duplex today shows 1 to 39% right ICA stenosis and a widely patent left carotid endarterectomy without recurrent stenosis.    Current Outpatient Medications  Medication Sig Dispense Refill   ascorbic acid (VITAMIN C) 500 MG tablet Take 500 mg by mouth daily.     aspirin EC 81 MG EC tablet Take 1 tablet (81 mg total) by mouth daily at 6 (six) AM.     atorvastatin (LIPITOR) 40 MG tablet 40 mg daily at 6 PM.      CELLCEPT 250 MG capsule Take 500 mg by mouth 2 (two) times daily.     Cholecalciferol (VITAMIN D) 50 MCG (2000 UT) CAPS Take 1 capsule by mouth daily.     Continuous Blood Gluc Sensor (FREESTYLE LIBRE 2 SENSOR) MISC by Does not apply route.     ELIQUIS 5 MG TABS tablet Take 5 mg by mouth 2 (two) times daily.      finasteride (PROSCAR) 5 MG tablet  TAKE 1 TABLET BY MOUTH ONCE DAILY. 90 tablet 0   HUMALOG KWIKPEN 100 UNIT/ML KiwkPen Inject 10-12 Units into the skin 3 (three) times daily. Inject 10u daily at breakfast, 10u at lunch and up to 12u at supper per SSI     Insulin Disposable Pump (OMNIPOD DASH 5 PACK PODS) MISC      Insulin Pen Needle 33G X 4 MM MISC To use with insulin pen 4 times per day. E 13.9     LANTUS SOLOSTAR 100 UNIT/ML Solostar Pen Inject 24 Units into the skin at bedtime.      linagliptin (TRADJENTA) 5 MG TABS tablet Take 5 mg by mouth daily.     losartan (COZAAR) 50 MG tablet Take 50 mg by mouth daily.     omeprazole (PRILOSEC) 20 MG capsule Take 20 mg by mouth daily as needed.      PROGRAF 1 MG capsule Take 0.5 mg by mouth at bedtime.     tamsulosin (FLOMAX) 0.4 MG CAPS capsule Take 0.4 mg by mouth 2 (two) times daily.      trospium (SANCTURA) 20 MG tablet Take 1 tablet (20 mg total) by mouth at bedtime. (Patient not taking: Reported on 05/31/2023) 30 tablet 3   No current facility-administered  medications for this visit.    Past Medical History:  Diagnosis Date   Arthritis    Chronic kidney disease    KIDNEY TRANSPLANT   Coronary artery disease    Diabetes mellitus without complication (HCC)    Dyspnea    GERD (gastroesophageal reflux disease)    History of blood transfusion    Hypertension    Myocardial infarction [facility])     Past Surgical History:  Procedure Laterality Date   CATARACT EXTRACTION W/PHACO Right 02/03/2017   Procedure: CATARACT EXTRACTION PHACO AND INTRAOCULAR LENS PLACEMENT (IOC);  Surgeon: Lockie Mola, MD;  Location: [facility] ORS;  Service: Ophthalmology;  Laterality: Right;  [place] 00:35.8 AP% 12.8 CDE 4.57 Fluid lot # [id number]   COLONOSCOPY WITH PROPOFOL N/A 12/30/2021   Procedure: COLONOSCOPY WITH PROPOFOL;  Surgeon: Wyline Mood, MD;  Location: [facility] ENDOSCOPY;  Service: Gastroenterology;  Laterality: N/A;   CORONARY ANGIOPLASTY     STENTS X 4   CORONARY ARTERY BYPASS GRAFT      ENDARTERECTOMY Left 09/06/2019   Procedure: ENDARTERECTOMY CAROTID;  Surgeon: Annice Needy, MD;  Location: [facility] ORS;  Service: Vascular;  Laterality: Left;   EYE SURGERY Bilateral    cataract   HIP SURGERY     KIDNEY TRANSPLANT     2016     Social History   Tobacco Use   Smoking status: Former   Smokeless tobacco: Never   Tobacco comments:    30 years ago  Vaping Use   Vaping status: Never Used  Substance Use Topics   Alcohol use: No    Alcohol/week: 0.0 standard drinks of alcohol   Drug use: Never       Family History  Problem Relation Age of Onset   Drug abuse Sister    Drug abuse Brother   No bleeding or clotting disorders  Allergies  Allergen Reactions   Oxybutynin Other (See Comments)    Incomplete bladder emptying     REVIEW OF SYSTEMS (Negative unless checked)  Constitutional: [] Weight loss  [] Fever  [] Chills Cardiac: [] Chest pain   [] Chest pressure   [] Palpitations   [] Shortness of breath when laying flat   [] Shortness of breath at rest   [x] Shortness of breath with exertion. Vascular:  [] Pain in legs with walking   [] Pain in legs at rest   [] Pain in legs when laying flat   [] Claudication   [] Pain in feet when walking  [] Pain in feet at rest  [] Pain in feet when laying flat   [] History of DVT   [] Phlebitis   [] Swelling in legs   [] Varicose veins   [] Non-healing ulcers Pulmonary:   [] Uses home oxygen   [] Productive cough   [] Hemoptysis   [] Wheeze  [] COPD   [] Asthma Neurologic:  [] Dizziness  [] Blackouts   [] Seizures   [] History of stroke   [] History of TIA  [] Aphasia   [] Temporary blindness   [] Dysphagia   [] Weakness or numbness in arms   [] Weakness or numbness in legs Musculoskeletal:  [x] Arthritis   [] Joint swelling   [] Joint pain   [] Low back pain Hematologic:  [] Easy bruising  [] Easy bleeding   [] Hypercoagulable state   [] Anemic   Gastrointestinal:  [] Blood in stool   [] Vomiting blood  [x] Gastroesophageal reflux/heartburn   [] Abdominal pain Genitourinary:   [x] Chronic kidney disease   [] Difficult urination  [] Frequent urination  [] Burning with urination   [] Hematuria Skin:  [] Rashes   [] Ulcers   [] Wounds Psychological:  [] History of anxiety   []  History of major depression.  Physical Examination  BP (!) 116/59 (BP Location: Right Arm)   Pulse 60   Resp 16   Wt 161 lb (73 kg)   BMI 24.48 kg/m  Gen:  WD/WN, NAD Head: [place]/AT, No temporalis wasting. Ear/Nose/Throat: Hearing grossly intact, nares w/o erythema or drainage Eyes: Conjunctiva clear. Sclera non-icteric Neck: Supple.  Trachea midline Pulmonary:  Good air movement, no use of accessory muscles.  Cardiac: RRR, no JVD Vascular: Aneurysmal left brachiocephalic AV fistula without any skin threat.  Good thrill is present. Vessel Right Left  Radial Palpable Palpable               Musculoskeletal: M/S 5/5 throughout.  No deformity or atrophy.  No significant lower extremity edema. Neurologic: Sensation grossly intact in extremities.  Symmetrical.  Speech is fluent.  Psychiatric: Judgment intact, Mood & affect appropriate for pt's clinical situation. Dermatologic: No rashes or ulcers noted.  No cellulitis or open wounds.      Labs Recent Results (from the past 2160 hour(s))  BLADDER SCAN AMB NON-IMAGING     Status: None   Collection Time: 03/17/23 11:59 AM  Result Value Ref Range   Scan Result 128 ml   Urinalysis, Complete     Status: Abnormal   Collection Time: 03/17/23  3:05 PM  Result Value Ref Range   Specific Gravity, UA 1.020 1.005 - 1.030   pH, UA 6.0 5.0 - 7.5   Color, UA Yellow Yellow   Appearance Ur Clear Clear   Leukocytes,UA Negative Negative   Protein,UA Negative Negative/Trace   Glucose, UA Negative Negative   Ketones, UA Negative Negative   RBC, UA Negative Negative   Bilirubin, UA Negative Negative   Urobilinogen, Ur 2.0 (H) 0.2 - 1.0 mg/dL   Nitrite, UA Negative Negative   Microscopic Examination See below:   Microscopic Examination     Status:  Abnormal   Collection Time: 03/17/23  3:05 PM   Urine  Result Value Ref Range   WBC, UA 0-5 0 - 5 /hpf   RBC, Urine 0-2 0 - 2 /hpf   Epithelial Cells (non renal) 0-10 0 - 10 /hpf   Casts Present (A) None seen /lpf   Cast Type Granular casts (A) N/A   Bacteria, UA Few None seen/Few  Urinalysis, Complete     Status: Abnormal   Collection Time: 05/11/23 11:38 AM  Result Value Ref Range   Specific Gravity, UA 1.020 1.005 - 1.030   pH, UA 7.0 5.0 - 7.5   Color, UA Yellow Yellow   Appearance Ur Clear Clear   Leukocytes,UA Negative Negative   Protein,UA Negative Negative/Trace   Glucose, UA 2+ (A) Negative   Ketones, UA Negative Negative   RBC, UA Negative Negative   Bilirubin, UA Negative Negative   Urobilinogen, Ur 2.0 (H) 0.2 - 1.0 mg/dL   Nitrite, UA Negative Negative   Microscopic Examination See below:   CULTURE, URINE COMPREHENSIVE     Status: None   Collection Time: 05/11/23 11:38 AM   Specimen: Urine   UR  Result Value Ref Range   Urine Culture, Comprehensive Final report    Organism ID, Bacteria Comment     Comment: Mixed urogenital flora 10,000-25,000 colony forming units per mL   Microscopic Examination     Status: None   Collection Time: 05/11/23 11:38 AM   Urine  Result Value Ref Range   WBC, UA 0-5 0 - 5 /hpf   RBC, Urine 0-2 0 - 2 /hpf  Epithelial Cells (non renal) 0-10 0 - 10 /hpf   Bacteria, UA Few None seen/Few    Radiology No results found.  Assessment/Plan  Complication of arteriovenous dialysis fistula A duplex was done today as it had not been assessed in many years and there is some mild narrowing near the cephalic vein subclavian vein confluence portion of the cephalic vein but this appears moderate and the remainder of the fistula appears widely patent.  He is not currently using the fistula and is not causing any major problems.  This narrowing may be contributing some to the aneurysmal degeneration, but there is no skin threat and I would not  intervene giving him contrast to potentially harming his kidney transplant for something that is not causing him a problem.  This can be checked every few years.  Carotid artery stenosis Carotid duplex today shows 1 to 39% right ICA stenosis and a widely patent left carotid endarterectomy without recurrent stenosis.  He is on aspirin, Eliquis, and Lipitor.  We will continue to follow this on an annual basis with duplex.  Essential (primary) hypertension blood pressure control important in reducing the progression of atherosclerotic disease. On appropriate oral medications.     Diabetes (HCC) blood glucose control important in reducing the progression of atherosclerotic disease. Also, involved in wound healing. On appropriate medications.     Hypercholesteremia lipid control important in reducing the progression of atherosclerotic disease. Continue statin therapy  Festus Barren, MD  05/31/2023 10:54 AM    This note was created with Dragon medical transcription system.  Any errors from dictation are purely unintentional

## 2023-05-31 NOTE — Assessment & Plan Note (Signed)
A duplex was done today as it had not been assessed in many years and there is some mild narrowing near the cephalic vein subclavian vein confluence portion of the cephalic vein but this appears moderate and the remainder of the fistula appears widely patent.  He is not currently using the fistula and is not causing any major problems.  This narrowing may be contributing some to the aneurysmal degeneration, but there is no skin threat and I would not intervene giving him contrast to potentially harming his kidney transplant for something that is not causing him a problem.  This can be checked every few years.

## 2023-05-31 NOTE — Assessment & Plan Note (Signed)
Carotid duplex today shows 1 to 39% right ICA stenosis and a widely patent left carotid endarterectomy without recurrent stenosis.  He is on aspirin, Eliquis, and Lipitor.  We will continue to follow this on an annual basis with duplex.

## 2023-06-01 ENCOUNTER — Telehealth: Payer: Self-pay

## 2023-06-01 NOTE — Telephone Encounter (Signed)
-----   Message from [employer] sent at 06/01/2023 10:16 AM EDT ----- Yes he is cleared for surgery. He can hold his Eliquis for 3 days prior. And restart the following day if no bleeding. ----- Message ----- From: Letta Kocher, CMA Sent: 05/31/2023  10:24 AM EDT To: Laurier Nancy, MD  Just wanted to make sure he is cleared and may hold Eliquis prior to surgery

## 2023-06-06 ENCOUNTER — Other Ambulatory Visit: Payer: Self-pay

## 2023-06-06 ENCOUNTER — Encounter
Admission: RE | Admit: 2023-06-06 | Discharge: 2023-06-06 | Disposition: A | Discharge status: Home / Self Care | Payer: Medicare Other | Source: Ambulatory Visit | Attending: Urology | Admitting: Urology

## 2023-06-06 VITALS — Ht 68.0 in | Wt 160.0 lb

## 2023-06-06 DIAGNOSIS — N1831 Chronic kidney disease, stage 3a: Secondary | ICD-10-CM

## 2023-06-06 NOTE — Patient Instructions (Addendum)
Your procedure is scheduled on: Monday 06/13/23 To find out your arrival time, please call [phone] between 1PM - 3PM on: Friday 06/10/23   Report to the Registration Desk on the 1st floor of the [facility]. FREE Valet parking is available.  If your arrival time is 6:00 am, do not arrive before that time as the [facility] entrance doors do not open until 6:00 am.  REMEMBER: Instructions that are not followed completely may result in serious medical risk, up to and including death; or upon the discretion of your surgeon and anesthesiologist your surgery may need to be rescheduled.  Do not eat food or drink any liquids after midnight the night before surgery.  No gum chewing or hard candies.  One week prior to surgery: Stop Anti-inflammatories (NSAIDS) such as Advil, Aleve, Ibuprofen, Motrin, Naproxen, Naprosyn and Aspirin based products such as Excedrin, Goody's Powder, BC Powder. You may however, continue to take Tylenol if needed for pain up until the day of surgery.  Stop ANY OVER THE COUNTER supplements or vitamins TODAY 06/06/23 until after surgery.  Continue taking all prescribed medications with the exception of the following: Eliquis, last dose Thursday night 06/09/23. Lantus, decrease Sunday night's dose to 12 units. No insulin the morning of your surgery.  TAKE ONLY THESE MEDICATIONS THE MORNING OF SURGERY WITH A SIP OF WATER:  CELLCEPT 500 MG capsule  finasteride (PROSCAR) 5 MG tablet  omeprazole (PRILOSEC) 20 MG capsule Antacid (take one the night before and one on the morning of surgery - helps to prevent nausea after surgery.) PROGRAF 1 MG capsule  tamsulosin (FLOMAX) 0.4 MG CAPS capsule   No Alcohol for 24 hours before or after surgery.  No Smoking including e-cigarettes for 24 hours before surgery.  No chewable tobacco products for at least 6 hours before surgery.  No nicotine patches on the day of surgery.  Do not use any "recreational" drugs for at least a  week (preferably 2 weeks) before your surgery.  Please be advised that the combination of cocaine and anesthesia may have negative outcomes, up to and including death. If you test positive for cocaine, your surgery will be cancelled.  On the morning of surgery brush your teeth with toothpaste and water, you may rinse your mouth with mouthwash if you wish. Do not swallow any toothpaste or mouthwash.  Use CHG Soap or wipes as directed on instruction sheet. Shower with your regular soap.  Do not wear lotions, powders, or perfumes.   Do not shave body hair from the neck down 48 hours before surgery.  Wear comfortable clothing (specific to your surgery type) to the hospital.  Do not wear jewelry, make-up, hairpins, clips or nail polish.  For welded (permanent) jewelry: bracelets, anklets, waist bands, etc.  Please have this removed prior to surgery.  If it is not removed, there is a chance that hospital personnel will need to cut it off on the day of surgery. Contact lenses, hearing aids and dentures may not be worn into surgery.  Do not bring valuables to the hospital. [facility] is not responsible for any missing/lost belongings or valuables.   Notify your doctor if there is any change in your medical condition (cold, fever, infection).  If you are being discharged the day of surgery, you will not be allowed to drive home. You will need a responsible individual to drive you home and stay with you for 24 hours after surgery.   If you are taking public transportation,  you will need to have a responsible individual with you.  If you are being admitted to the hospital overnight, leave your suitcase in the car. After surgery it may be brought to your room.  In case of increased patient census, it may be necessary for you, the patient, to continue your postoperative care in the Same Day Surgery department.  After surgery, you can help prevent lung complications by doing breathing exercises.   Take deep breaths and cough every 1-2 hours. Your doctor may order a device called an Incentive Spirometer to help you take deep breaths. When coughing or sneezing, hold a pillow firmly against your incision with both hands. This is called "splinting." Doing this helps protect your incision. It also decreases belly discomfort.  Surgery Visitation Policy:  Patients undergoing a surgery or procedure may have two family members or support persons with them as long as the person is not COVID-19 positive or experiencing its symptoms.   Inpatient Visitation:    Visiting hours are 7 a.m. to 8 p.m. Up to four visitors are allowed at one time in a patient room. The visitors may rotate out with other people during the day. One designated support person (adult) may remain overnight.  Please call the Pre-admissions Testing Dept. at [phone] if you have any questions about these instructions.  Su trmite est programado para el: Lunes 06/13/23 Para saber su hora de llegada, llame al [phone] entre la 1:00 p. m. y las 3:00 p. m. el: viernes 03/13/23   Presntese en el mostrador de Chartered certified accountant piso del [facility]. Disponemos de servicio de aparcacoches GRATUITO.  Si su hora de llegada es a las 6:00 am, no llegue antes de esa hora ya que las puertas de [place] del [facility] no se abren Teacher, adult education las 6:00 am.  RECORDAR: Las instrucciones que no se siguen completamente pueden provocar riesgos mdicos graves, que pueden llegar hasta la [place]; o, segn el criterio de su cirujano y Scientific laboratory technician, es posible que sea Aeronautical engineer su Leisure centre manager.  No coma ni beba lquidos despus de la medianoche del da anterior a la ciruga.  No mascar chicle ni caramelos duros.  Una semana antes de la ciruga: Detenga los antiinflamatorios (AINE) como Advil, Aleve, Ibuprofeno, Motrin, Naproxen, Naprosyn y productos a base de aspirina como Excedrin, Goody's Powder, BC Powder. Sin embargo, puede  Educational psychologist tomando Tylenol si es necesario para Marketing executive de la [place].  Deje de CUALQUIER suplemento o vitamina de venta libre HOY 11/11/22 hasta despus de la [place].  Contine tomando todos los medicamentos recetados con excepcin de los siguientes: Eliquis, ltima dosis el jueves por la noche 02/10/23. Lantus, disminuya la dosis del domingo por la noche a 12 unidades. Sin insulina la maana de su ciruga  TOME SLO ESTOS MEDICAMENTOS LA MAANA DE LA CIRUGA CON UN SORBO DE AGUA:  1. CELLCEPT 500 MG cpsula  2. finasterida (PROSCAR) tableta de 5 MG  3. omeprazol (PRILOSEC) cpsula de 20 mg Anticido (tome uno la noche anterior y otro la maana de la ciruga; [place] a prevenir las nuseas despus de la ciruga). 4. PROGRAF 1 MG cpsula  5. cpsula de tamsulosina (FLOMAX) 0,4 MG CAPS   No consumir alcohol durante 24 horas antes o despus de la [place].  No fumar, incluidos los cigarrillos electrnicos, durante las 24 horas previas a la [place].  No consumir productos de tabaco masticables durante al menos 6 horas antes de la [place].  Sin  parches de Optometrist de la [place].  No use ningn medicamento "recreativo" durante al menos una semana (preferiblemente 2 semanas) antes de la ciruga.  Tenga en cuenta que la combinacin de cocana y anestesia puede [employer], que pueden llegar hasta la [place]. Si su prueba de cocana da positivo, su ciruga ser cancelada.  La maana de la ciruga cepille sus dientes con pasta dental y agua, puede enjuagarse la boca con enjuague bucal si lo desea. No ingiera pasta de dientes ni enjuague bucal.  Utilice jabn o toallitas CHG como se indica en la hoja de instrucciones. Dchate con tu jabn habitual.  No use lociones, polvos ni perfumes.   No se afeite el vello corporal desde el cuello hacia abajo 48 horas antes de la [place].  Lleve ropa cmoda (especfica para su tipo de [place]) al hospital.  No use joyas,  maquillaje, horquillas, clips ni esmalte de uas.  Para joyera soldada (permanente): pulseras, tobilleras, cinturillas, etc. Retrelos antes de la ciruga.  Si no se extrae, existe la posibilidad de que el personal del hospital deba cortrselo el da de la [place]. No se pueden usar lentes de contacto, audfonos ni dentaduras postizas durante la [place].  No lleve objetos de valor al hospital. [facility] no es responsable de ninguna pertenencia u objeto de valor perdido o perdido.   Notifique a su mdico si hay algn cambio en su condicin mdica (resfriado, fiebre, infeccin).  Si le dan el alta el da de la [place], no se le permitir conducir a casa. Necesitar que una persona responsable lo lleve a su casa y se quede con usted durante las 24 horas posteriores a la [place].   Si viaja en transporte pblico, deber ir acompaado de una persona responsable.  Si vas a pasar la noche en el hospital, deja tu maleta en el coche. Despus de la [place], es posible que lo lleven a su habitacin.  En caso de un mayor censo de [place], puede ser necesario que usted, el [place], contine con su atencin posoperatoria en el departamento de [place] el [place].  Despus de la [place], usted puede ayudar a prevenir complicaciones pulmonares haciendo ejercicios de respiracin.  Respire profundamente y tosa cada 1 o 2 horas. Su mdico puede indicarle un dispositivo llamado espirmetro incentivador para ayudarle a respirar profundamente. Al toser o Engineering geologist, sostenga firmemente una almohada contra la incisin con ambas manos. Esto se llama "ferulizacin". Hacer esto ayuda a proteger su incisin. Tambin disminuye las molestias abdominales.  Poltica de visitas a ciruga:  [employer] se someten a [place] o procedimiento pueden Delphi familiares o personas de apoyo con ellos, siempre y cuando la persona no sea positiva para COVID-19 ni experimente sus sntomas.   Visitas para pacientes  hospitalizados:    El horario de visita es de 7 a 20 horas. Se permiten hasta cuatro visitantes a la vez en la habitacin de un paciente. Los visitantes podrn rotar con Garment/textile technologist. Neomia Dear persona de apoyo designada (adulto) podr pasar la noche.  Llame al Departamento de pruebas previas a la admisin al [phone] si tiene alguna pregunta sobre estas instrucciones.

## 2023-06-09 LAB — HEMOGLOBIN A1C: Hemoglobin A1C: 7

## 2023-06-10 ENCOUNTER — Encounter: Payer: Self-pay | Admitting: Urology

## 2023-06-10 NOTE — Progress Notes (Signed)
Perioperative / Anesthesia Services  Pre-Admission Testing Clinical Review / Pre-Operative Anesthesia Consult  Date: 06/12/23  Patient Demographics:  Name: [name] DOB:   [birth year] MRN:   [id number]  Planned Surgical Procedure(s):    Case: [id number] Date/Time: 06/13/23 0827   Procedure: CYSTOSCOPY WITH INSERTION OF UROLIFT   Anesthesia type: Monitor Anesthesia Care   Pre-op diagnosis: Benign Prostatic Hyperplasia with Urinary Obstruction   Location: [facility] OR ROOM 10 / [facility]   Surgeons: [name], MD     NOTE: Available PAT nursing documentation and vital signs have been reviewed. Clinical nursing staff has updated patient's PMH/PSHx, current medication list, and drug allergies/intolerances to ensure comprehensive history available to assist in medical decision making as it pertains to the aforementioned surgical procedure and anticipated anesthetic course. Extensive review of available clinical information personally performed. [name] PMH and PSHx updated with any diagnoses/procedures that  may have been inadvertently omitted during his intake with the pre-admission testing department's nursing staff.  Clinical Discussion:  [name] is a 67 y.o. male who is submitted for pre-surgical anesthesia review and clearance prior to him undergoing the above procedure. Patient is a Former Games developer. Pertinent PMH includes: CAD (s/p CABG), MI x 2, BILATERAL carotid artery disease (s/p LEFT endarterectomy), PAF, aortic atherosclerosis, [place], HTN, HLD, T2DM, dyspnea, ESRD (s/p RIGHT renal transplant), GERD (on daily PPI), OA, lumbar DDD.  Patient is followed by cardiology Welton Flakes, MD). He was last seen in the cardiology clinic on 05/19/2023; notes reviewed. At the time of his clinic visit, patient doing well overall from a cardiovascular perspective. Patient denied any chest pain, shortness of breath, PND, orthopnea, palpitations, significant peripheral  edema, weakness, fatigue, vertiginous symptoms, or presyncope/syncope. Patient with a past medical history significant for cardiovascular diagnoses. Documented physical exam was grossly benign, providing no evidence of acute exacerbation and/or decompensation of the patient's known cardiovascular conditions.  Patient reported to have suffered MIs and 2004 and 2005.  Each cardiovascular event resulted in PCI and stent placement.  Stents x 2 (unknown location/unknown type) placed in 2004 and 1 stent was placed in 2005.  Diagnostic LEFT heart catheterization was performed on 05/20/2016 revealing multivessel CAD; 50% ostial LM, 75% proximal LAD, 90% mid LAD, 80% D1, 75% D2, 85% OM1, and 85% distal RCA.  There was 40% in-stent restenosis of the previously placed proximal RCA stent.  Given the degree and complexity of his coronary artery disease, patient was referred to CVTS for consideration of revascularization procedure.  Patient underwent two-vessel revascularization on 06/02/2006.  LIMA-LAD, and SVG-PDA bypass grafts were placed by Dr. Marshell Garfinkel at [facility].  Diagnostic LEFT heart catheterization was performed on 08/10/2007 revealing multivessel CAD; 30% proximal LAD, 70% D1, 70% D2, 100% OM 2, 90% distal RCA, and 50% RPDA.  There was 50% ISR of the previously placed proximal RCA stent.  Bypass grafts x 2 were patent.  The decision was made to defer further intervention opting for medical management.  Diagnostic LEFT heart catheterization was performed on 08/25/2007 revealing 80% stenosis of the ostial D1.  PCI was subsequently performed placing a 2.5 x 12 mm Xience V Everolimus stent yielding excellent angiographic result and TIMI-3 flow.  Patient with a history of significant carotid artery disease.  He is status post LEFT carotid endarterectomy on 09/06/2019.  Most recent carotid Doppler study was performed on 10/10/2019 revealing 40-59% stenosis in the RICA.   Most recent TTE  was performed on 12/27/2022 revealing  a normal left ventricular systolic function with an EF of 52%.  Mild LVH noted.  There was mild inferior hypokinesis.  Moderate LEFT and mild RIGHT atrial enlargement was noted.  There was mild biventricular enlargement.  There was trivial mitral valve regurgitation.  All transvalvular gradients were noted to be normal providing no evidence suggestive of valvular stenosis.  Most recent myocardial perfusion imaging study was performed on 01/11/2023 revealing a moderately reduced left ventricular systolic function with an EF of 37%.  Mild inferior wall hypokinesis was observed.  Left ventricle was dilated with a large severe fixed basal and mid inferior, inferolateral, and anterolateral wall defect consistent with previous MI in the LCx/RCA.  Patient with frequent PVCs and underlying atrial fibrillation.  [facility] treadmill score 3.5 indicating a moderate risk study.  Patient with an atrial fibrillation diagnosis; CHA2DS2-VASc Score = 5 (age, HTN, vascular disease history/MI, T2DM).  His rate and rhythm are currently being maintained intrinsically without pharmacological intervention. He is chronically anticoagulated using apixaban; reported to be compliant with therapy with no evidence or reports of GI/GU bleeding.  Blood pressure well controlled at 110/60 mmHg on currently prescribed ARB (losartan) monotherapy. He is on atorvastatin for his HLD diagnosis and further ASCVD prevention. T2DM well controlled on currently prescribed regimen; last HgbA1c was 7.3% when checked on 12/16/2022.  Of note, since patient was last seen by his cardiologist, his hemoglobin A1c has been rechecked with further improvement to 7.0% when checked on 06/09/2023. Patient does not have an OSAH diagnosis. Patient is able to complete all of his  ADL/IADLs without cardiovascular limitation.  Per the [facility], patient is able to achieve at least 4 METS of physical activity without experiencing any significant  degree of angina/anginal equivalent symptoms.  No changes were made to his medication regimen.  Patient to follow-up with outpatient cardiology in 4 months or sooner if needed.  [name] is scheduled for CYSTOSCOPY WITH INSERTION OF UROLIFT on 06/13/2023 with Dr. [name], MD.  Given patient's past medical history significant for cardiovascular diagnoses, presurgical cardiac clearance was sought by the PAT team. Per cardiology, "this patient is optimized for surgery and may proceed with the planned procedural course with a ACCEPTABLE risk of significant perioperative cardiovascular complications".  Again, this patient is on daily oral anticoagulation therapy using a DOAC medication.  He has been instructed on recommendations for holding his apixaban for 3 days prior to his procedure with plans to restart as soon as postoperative bleeding risk felt to be minimized by his attending surgeon. The patient has been instructed that his last dose of his apixaban should be on 06/09/2023.  Patient denies previous perioperative complications with anesthesia in the past. In review of the available records, it is noted that patient underwent a general anesthetic course here at [facility] ([facility]) in 11/2021 without documented complications.      06/06/2023    1:46 PM 05/31/2023   10:10 AM 05/19/2023    9:56 AM  Vitals with BMI  Height 5\' 8"   5\' 8"   Weight 160 lbs 161 lbs 166 lbs 10 oz  BMI 24.33 24.49 25.34  Systolic  116 110  Diastolic  59 60  Pulse  60 66    Providers/Specialists:   NOTE: Primary physician provider listed below. Patient may have been seen by APP or partner within same practice.   PROVIDER ROLE / SPECIALTY LAST Jaquelyn Bitter, MD Urology (Surgeon) 05/11/2023  Abram Sander, MD  Primary Care Provider ???  Adrian Blackwater, MD Cardiology 05/19/2023  Festus Barren, MD Vascular Surgery 05/31/2023  Wendall Mola, MD Endocrinology  12/08/2022  Leafy Half, MD   Nephrology 12/16/2022   Allergies:  Oxybutynin  Current Home Medications:   No current facility-administered medications for this encounter.    ascorbic acid (VITAMIN C) 500 MG tablet   aspirin EC 81 MG EC tablet   atorvastatin (LIPITOR) 40 MG tablet   CELLCEPT 250 MG capsule   Cholecalciferol (VITAMIN D) 50 MCG (2000 UT) CAPS   ELIQUIS 5 MG TABS tablet   finasteride (PROSCAR) 5 MG tablet   HUMALOG KWIKPEN 100 UNIT/ML KiwkPen   LANTUS SOLOSTAR 100 UNIT/ML Solostar Pen   linagliptin (TRADJENTA) 5 MG TABS tablet   losartan (COZAAR) 50 MG tablet   omeprazole (PRILOSEC) 20 MG capsule   PROGRAF 1 MG capsule   tamsulosin (FLOMAX) 0.4 MG CAPS capsule   Continuous Blood Gluc Sensor (FREESTYLE LIBRE 2 SENSOR) MISC   Insulin Disposable Pump (OMNIPOD DASH 5 PACK PODS) MISC   Insulin Pen Needle 33G X 4 MM MISC   trospium (SANCTURA) 20 MG tablet   History:   Past Medical History:  Diagnosis Date   Aortic atherosclerosis (HCC)    Arthritis    BPH (benign prostatic hyperplasia)    Carotid arterial disease (HCC)    a.) s/p LEFT CEA on 09/06/2019   Coronary artery disease    a.) MI with stents x 2 in 2004; b.) MI with stent x 1 2005; c.) s/p CABG x 2 06/02/2006 (LIMA-LAD, SVG-PDA); d.) LHC/PCI 08/25/2007: 80% oD1 (2.5 x 12 mm Xience V DES)   DDD (degenerative disc disease), lumbar    Dyspnea    ESRD (end stage renal disease) (HCC)    a.) s/p RIGHT renal 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/2015   GERD (gastroesophageal reflux disease)    History of blood transfusion    Hypertension    Long term current use of aspirin    Long term current use of immunosuppressive drug    a.) mycophenolate + tacrolimus   Myocardial infarction (HCC) 2004   a.) details unclear; stents x 2 (unknown type/location)   Myocardial infarction (HCC) 2005   a.) details unclear; stent x 1 (unknown type/location)   On apixaban therapy    PAF (paroxysmal atrial fibrillation) (HCC)    a.)  CHA2DS2-VASc = 5 (age, HTN, vascular disease history/MI, T2DM) as of 06/10/2023; b.) cardiac rate/rhythm maintained intrinsically without pharmacological intervention; chronically anticoagulated using apixaban   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   T2DM (type 2 diabetes mellitus) (HCC)    Unstable angina (HCC)    Vitamin D deficiency    Past Surgical History:  Procedure Laterality Date   CATARACT EXTRACTION Bilateral    CATARACT EXTRACTION W/PHACO Right 02/03/2017   Procedure: CATARACT EXTRACTION PHACO AND INTRAOCULAR LENS PLACEMENT (IOC);  Surgeon: Lockie Mola, MD;  Location: [facility] ORS;  Service: Ophthalmology;  Laterality: Right;  [place] 00:35.8 AP% 12.8 CDE 4.57 Fluid lot # [id number]   COLONOSCOPY WITH PROPOFOL N/A 12/30/2021   Procedure: COLONOSCOPY WITH PROPOFOL;  Surgeon: Wyline Mood, MD;  Location: [facility] ENDOSCOPY;  Service: Gastroenterology;  Laterality: N/A;   CORONARY ANGIOPLASTY WITH STENT PLACEMENT Left 2004   stents x 2   CORONARY ANGIOPLASTY WITH STENT PLACEMENT Left 2005   stent x 1   CORONARY ANGIOPLASTY WITH STENT PLACEMENT Left 08/25/2007   Procedure: CORONARY ANGIOPLASTY WITH STENT PLACEMENT; Location: [facility]; Surgeon: Lennart Pall, MD   CORONARY ARTERY BYPASS  GRAFT N/A 06/08/2006   Procedure: CORONARY ARTERY BYPASS GRAFT; Location: [facility]; Surgeon: Marshell Garfinkel, MD   ENDARTERECTOMY Left 09/06/2019   Procedure: ENDARTERECTOMY CAROTID;  Surgeon: Annice Needy, MD;  Location: [facility] ORS;  Service: Vascular;  Laterality: Left;   HIP FRACTURE SURGERY     KIDNEY TRANSPLANT Right 05/10/2015   LEFT HEART CATH AND CORONARY ANGIOGRAPHY Left 05/20/2006   Procedure: LEFT HEART CATH AND CORONARY ANGIOGRAPHY; Location: [facility]; Surgeon: Adrian Blackwater, MD   LEFT HEART CATH AND CORS/GRAFTS ANGIOGRAPHY Left 08/10/2007   Procedure: LEFT HEART CATH AND CORS/GRAFTS ANGIOGRAPHY; Location: [facility]; Surgeon: Adrian Blackwater, MD   LEFT HEART CATH AND CORS/GRAFTS ANGIOGRAPHY Left 05/04/2011    Procedure: LEFT HEART CATH AND CORS/GRAFTS ANGIOGRAPHY; Location: [facility]; Surgeon: Adrian Blackwater, MD   Family History  Problem Relation Age of Onset   Drug abuse Sister    Drug abuse Brother    Social History   Tobacco Use   Smoking status: Former   Smokeless tobacco: Never   Tobacco comments:    30 years ago  Vaping Use   Vaping status: Never Used  Substance Use Topics   Alcohol use: No    Alcohol/week: 0.0 standard drinks of alcohol   Drug use: Never    Pertinent Clinical Results:  LABS:    Component Ref Range & Units 06/09/2023  Glucose 70 - 110 mg/dL 92  Sodium 147 - 829 mmol/L 139  Potassium 3.6 - 5.1 mmol/L 4.2  Chloride 97 - 109 mmol/L 106  Carbon Dioxide (CO2) 22.0 - 32.0 mmol/L 26.7  Calcium 8.7 - 10.3 mg/dL 9  Urea Nitrogen (BUN) 7 - 25 mg/dL 31 High   Creatinine 0.7 - 1.3 mg/dL 1.3  Glomerular Filtration Rate (eGFR) >60 mL/min/1.73sq m 60 Low   BUN/Crea Ratio 6.0 - 20.0 23.8 High   Anion Gap w/K 6.0 - 16.0 10.5  Resulting Agency [facility]  Specimen Collected: 06/09/23 08:23   Performed by: [facility] Last Resulted: 06/09/23 10:30  Received From: [facility]  Result Received: 06/10/23 09:10   Component Ref Range & Units 03/07/2023  WBC 3.6 - 11.2 10*9/L 5.3  RBC 4.26 - 5.60 10*12/L 5.13  HGB 12.9 - 16.5 g/dL 56.2  HCT 13.0 - 86.5 % 47.0  MCV 77.6 - 95.7 fL 91.6  MCH 25.9 - 32.4 pg 30.6  MCHC 32.0 - 36.0 g/dL 78.4  RDW 69.6 - 29.5 % 14.5  MPV 6.8 - 10.7 fL 7.8  Platelet 150 - 450 10*9/L 137 Low   nRBC <=4 /100 WBCs 0  Neutrophils % % 67.3  Lymphocytes % % 19.4  Monocytes % % 9.6  Eosinophils % % 2.7  Basophils % % 1.0  Absolute Neutrophils 1.8 - 7.8 10*9/L 3.6  Absolute Lymphocytes 1.1 - 3.6 10*9/L 1.0 Low   Absolute Monocytes 0.3 - 0.8 10*9/L 0.5  Absolute Eosinophils 0.0 - 0.5 10*9/L 0.1  Absolute Basophils 0.0 - 0.1 10*9/L 0.1  Resulting Agency [facility]  [facility]  Specimen Collected: 03/07/23 08:09   Performed by: [facility] Last Resulted: 03/07/23 08:20  Received From: [facility]  Result Received: 03/08/23 09:30    Component Date Value Ref Range Status   Specific Gravity, UA 05/11/2023 1.020  1.005 - 1.030 Final   pH, UA 05/11/2023 7.0  5.0 - 7.5 Final   Color, UA 05/11/2023 Yellow  Yellow Final   Appearance Ur 05/11/2023 Clear  Clear Final   Leukocytes,UA 05/11/2023 Negative  Negative Final   Protein,UA 05/11/2023 Negative  Negative/Trace Final   Glucose, UA 05/11/2023 2+ (A)  Negative Final   Ketones, UA 05/11/2023 Negative  Negative Final   RBC, UA 05/11/2023 Negative  Negative Final   Bilirubin, UA 05/11/2023 Negative  Negative Final   Urobilinogen, Ur 05/11/2023 2.0 (H)  0.2 - 1.0 mg/dL Final   Nitrite, UA 64/40/3474 Negative  Negative Final   Microscopic Examination 05/11/2023 See below:   Final   Urine Culture, Comprehensive 05/11/2023 Final report   Final   Organism ID, Bacteria 05/11/2023 Comment   Final   Comment: Mixed urogenital flora 10,000-25,000 colony forming units per mL    WBC, UA 05/11/2023 0-5  0 - 5 /hpf Final   RBC, Urine 05/11/2023 0-2  0 - 2 /hpf Final   Epithelial Cells (non renal) 05/11/2023 0-10  0 - 10 /hpf Final   Bacteria, UA 05/11/2023 Few  None seen/Few Final    ECG: Date: 01/24/2023 Time ECG obtained: 1220 PM Rate: 82 bpm Rhythm: atrial fibrillation Axis (leads I and aVF): Normal Intervals: QRS 82 ms. QTc 524 ms. ST segment and T wave changes: No evidence of acute ST segment elevation or depression. Evidence of a possible age undetermined inferior infarct present. Comparison: Similar to previous tracing obtained on 12/30/2022   IMAGING / PROCEDURES: VAS US CAROTID performed on 05/31/2023 Velocities in the right ICA are consistent with a 1-39% stenosis. The ECA appears >50% stenosed.  There is no evidence of stenosis in the left ICA. The extracranial  vessels were near-normal with only minimal wall thickening or plaque.  No significant change when compared to the previous exam on 01/08/20.   MYOCARDIAL PERFUSION IMAGING STUDY (LEXISCAN) performed on 01/11/2023 Moderately reduced left ventricular systolic function with a normal LVEF of 37% Normal myocardial thickening Mild inferior wall hypokinesis Left ventricular cavity size normal Large severe basal, mid inferior, inferolateral, and anterolateral wall defects consistent with LCx/RCA territory infarction  Underlying atrial fibrillation [facility] treadmill score 3.5 Moderate risk study  TRANSTHORACIC ECHOCARDIOGRAM performed on 12/27/2022 Technically difficult study due to body habitus.  Unable to determine left ventricular systolic function due to being in atrial fibrillation.  Normal right ventricular systolic function.  Normal right ventricular diastolic function.  Mild hypokinesis.  Normal pulmonary artery pressure.  Trace mitral regurgitation.  No pericardial effusion.  Moderately dilated Left atrium  Mildly dilated Right atrium  Mildly dilated Left and Right ventricle.  Mild Left ventricle systolic dysfunction.  Mild LVH.   MR LUMBAR SPINE WO CONTRAST performed on 11/26/2022 Chronic collapse of the L3 vertebral body and to a lesser extent the L2 vertebral body with osseous fusion across the disc space likely related to prior discitis/osteomyelitis. There is bony retropulsion measuring approximately 8 mm resulting in moderate to severe spinal canal stenosis with effacement of the right subarticular zone which likely affects the traversing right L3 nerve root. Otherwise overall mild degenerative changes at the other levels as above without other significant spinal canal or neural foraminal stenosis.  Impression and Plan:  [name] has been referred for pre-anesthesia review and clearance prior to him undergoing the planned anesthetic and procedural courses. Available labs,  pertinent testing, and imaging results were personally reviewed by me in preparation for upcoming operative/procedural course. [facility] medical record has been updated following extensive record review and patient interview with PAT staff.   This patient has been appropriately cleared by cardiology with an overall ACCEPTABLE risk of experiencing significant perioperative cardiovascular complications.  Based on clinical review performed today (06/12/23), barring any significant acute changes in the patient's overall condition, it is anticipated that he will be able to proceed with the planned surgical intervention. Any acute changes in clinical condition may necessitate his procedure being postponed and/or cancelled. Patient will meet with anesthesia team (MD and/or CRNA) on the day of his procedure for preoperative evaluation/assessment. Questions regarding anesthetic course will be fielded at that time.   Pre-surgical instructions were reviewed with the patient during his PAT appointment, and questions were fielded to satisfaction by PAT clinical staff. He has been instructed on which medications that he will need to hold prior to surgery, as well as the ones that have been deemed safe/appropriate to take on the day of his procedure. As part of the general education provided by PAT, patient made aware both verbally and in writing, that he would need to abstain from the use of any illegal substances during his perioperative course.  He was advised that failure to follow the provided instructions could necessitate case cancellation or result in serious perioperative complications up to and including death. Patient encouraged to contact PAT and/or his surgeon's office to discuss any questions or concerns that may arise prior to surgery; verbalized understanding.   Quentin Mulling, MSN, APRN, FNP-C, CEN [facility]  Perioperative Services Nurse Practitioner Phone: [phone] Fax: [phone] 06/12/23 1:41 PM  NOTE: This note has been prepared using Dragon dictation software. Despite my best ability to proofread, there is always the potential that unintentional transcriptional errors may still occur from this process.

## 2023-06-12 MED ORDER — CHLORHEXIDINE GLUCONATE 0.12 % MT SOLN
15.0000 mL | Freq: Once | OROMUCOSAL | Status: AC
  Administered 2023-06-13: 15 mL via OROMUCOSAL

## 2023-06-12 MED ORDER — SODIUM CHLORIDE 0.9 % IV SOLN: INTRAVENOUS | Status: DC

## 2023-06-12 MED ORDER — ORAL CARE MOUTH RINSE: 15.0000 mL | Freq: Once | OROMUCOSAL | Status: AC

## 2023-06-12 MED ORDER — CEFAZOLIN SODIUM-DEXTROSE 2-4 GM/100ML-% IV SOLN
2.0000 g | INTRAVENOUS | Status: AC
  Administered 2023-06-13: 2 g via INTRAVENOUS

## 2023-06-13 ENCOUNTER — Encounter
Admission: RE | Disposition: A | Discharge status: Home / Self Care | Payer: Self-pay | Source: Home / Self Care | Attending: Urology

## 2023-06-13 ENCOUNTER — Ambulatory Visit: Payer: Medicare Other | Admitting: Urgent Care

## 2023-06-13 ENCOUNTER — Ambulatory Visit (INDEPENDENT_AMBULATORY_CARE_PROVIDER_SITE_OTHER): Payer: Medicare Other | Admitting: Physician Assistant

## 2023-06-13 ENCOUNTER — Other Ambulatory Visit: Payer: Self-pay

## 2023-06-13 ENCOUNTER — Ambulatory Visit
Admission: RE | Admit: 2023-06-13 | Discharge: 2023-06-13 | Disposition: A | Discharge status: Home / Self Care | Payer: Medicare Other | Attending: Urology | Admitting: Urology

## 2023-06-13 ENCOUNTER — Encounter: Payer: Self-pay | Admitting: Urology

## 2023-06-13 ENCOUNTER — Telehealth: Payer: Self-pay

## 2023-06-13 DIAGNOSIS — N138 Other obstructive and reflux uropathy: Secondary | ICD-10-CM | POA: Insufficient documentation

## 2023-06-13 DIAGNOSIS — N186 End stage renal disease: Secondary | ICD-10-CM | POA: Diagnosis not present

## 2023-06-13 DIAGNOSIS — R339 Retention of urine, unspecified: Secondary | ICD-10-CM | POA: Diagnosis not present

## 2023-06-13 DIAGNOSIS — I252 Old myocardial infarction: Secondary | ICD-10-CM | POA: Insufficient documentation

## 2023-06-13 DIAGNOSIS — Z940 Kidney transplant status: Secondary | ICD-10-CM | POA: Diagnosis not present

## 2023-06-13 DIAGNOSIS — Z7984 Long term (current) use of oral hypoglycemic drugs: Secondary | ICD-10-CM | POA: Diagnosis not present

## 2023-06-13 DIAGNOSIS — E1122 Type 2 diabetes mellitus with diabetic chronic kidney disease: Secondary | ICD-10-CM | POA: Diagnosis not present

## 2023-06-13 DIAGNOSIS — Z87891 Personal history of nicotine dependence: Secondary | ICD-10-CM | POA: Diagnosis not present

## 2023-06-13 DIAGNOSIS — I2510 Atherosclerotic heart disease of native coronary artery without angina pectoris: Secondary | ICD-10-CM | POA: Insufficient documentation

## 2023-06-13 DIAGNOSIS — I120 Hypertensive chronic kidney disease with stage 5 chronic kidney disease or end stage renal disease: Secondary | ICD-10-CM | POA: Insufficient documentation

## 2023-06-13 DIAGNOSIS — Z7901 Long term (current) use of anticoagulants: Secondary | ICD-10-CM | POA: Insufficient documentation

## 2023-06-13 DIAGNOSIS — K219 Gastro-esophageal reflux disease without esophagitis: Secondary | ICD-10-CM | POA: Insufficient documentation

## 2023-06-13 DIAGNOSIS — Z794 Long term (current) use of insulin: Secondary | ICD-10-CM | POA: Diagnosis not present

## 2023-06-13 DIAGNOSIS — Z951 Presence of aortocoronary bypass graft: Secondary | ICD-10-CM | POA: Diagnosis not present

## 2023-06-13 DIAGNOSIS — I480 Paroxysmal atrial fibrillation: Secondary | ICD-10-CM | POA: Insufficient documentation

## 2023-06-13 DIAGNOSIS — N401 Enlarged prostate with lower urinary tract symptoms: Secondary | ICD-10-CM | POA: Insufficient documentation

## 2023-06-13 HISTORY — DX: Long term (current) use of aspirin: Z79.82

## 2023-06-13 HISTORY — DX: Unstable angina: I20.0

## 2023-06-13 HISTORY — DX: Type 2 diabetes mellitus without complications: E11.9

## 2023-06-13 HISTORY — DX: Benign prostatic hyperplasia without lower urinary tract symptoms: N40.0

## 2023-06-13 HISTORY — PX: CYSTOSCOPY WITH INSERTION OF UROLIFT: SHX6678

## 2023-06-13 HISTORY — DX: Other intervertebral disc degeneration, lumbar region without mention of lumbar back pain or lower extremity pain: M51.369

## 2023-06-13 HISTORY — DX: End stage renal disease: N18.6

## 2023-06-13 HISTORY — DX: Long term (current) use of unspecified immunomodulators and immunosuppressants: Z79.60

## 2023-06-13 HISTORY — DX: Atherosclerosis of aorta: I70.0

## 2023-06-13 HISTORY — DX: Long term (current) use of anticoagulants: Z79.01

## 2023-06-13 HISTORY — DX: Vitamin D deficiency, unspecified: E55.9

## 2023-06-13 HISTORY — DX: Disorder of arteries and arterioles, unspecified: I77.9

## 2023-06-13 HISTORY — DX: Other long term (current) drug therapy: Z79.899

## 2023-06-13 HISTORY — DX: Paroxysmal atrial fibrillation: I48.0

## 2023-06-13 LAB — GLUCOSE, CAPILLARY
Glucose-Capillary: 143 mg/dL — ABNORMAL HIGH (ref 70–99)
Glucose-Capillary: 165 mg/dL — ABNORMAL HIGH (ref 70–99)

## 2023-06-13 LAB — BLADDER SCAN AMB NON-IMAGING

## 2023-06-13 SURGERY — CYSTOSCOPY WITH INSERTION OF UROLIFT
Anesthesia: General | Site: Prostate

## 2023-06-13 MED ORDER — FENTANYL CITRATE (PF) 100 MCG/2ML IJ SOLN
25.0000 ug | INTRAMUSCULAR | Status: DC | PRN
Start: 2023-06-13 — End: 2023-06-13

## 2023-06-13 MED ORDER — MIDAZOLAM HCL 2 MG/2ML IJ SOLN
INTRAMUSCULAR | Status: DC | PRN
  Administered 2023-06-13: 2 mg via INTRAVENOUS

## 2023-06-13 MED ORDER — HYDROCODONE-ACETAMINOPHEN 5-325 MG PO TABS: 1.0000 | ORAL_TABLET | Freq: Four times a day (QID) | ORAL | 0 refills | Status: DC | PRN

## 2023-06-13 MED ORDER — PROPOFOL 1000 MG/100ML IV EMUL
INTRAVENOUS | Status: AC
  Filled 2023-06-13: qty 100

## 2023-06-13 MED ORDER — FENTANYL CITRATE (PF) 100 MCG/2ML IJ SOLN
INTRAMUSCULAR | Status: DC | PRN
  Administered 2023-06-13: 50 ug via INTRAVENOUS

## 2023-06-13 MED ORDER — GLYCOPYRROLATE 0.2 MG/ML IJ SOLN
INTRAMUSCULAR | Status: DC | PRN
  Administered 2023-06-13: .2 mg via INTRAVENOUS

## 2023-06-13 MED ORDER — FENTANYL CITRATE (PF) 100 MCG/2ML IJ SOLN
INTRAMUSCULAR | Status: AC
  Filled 2023-06-13: qty 2

## 2023-06-13 MED ORDER — STERILE WATER FOR IRRIGATION IR SOLN
Status: DC | PRN
  Administered 2023-06-13: 3000 mL

## 2023-06-13 MED ORDER — ONDANSETRON HCL 4 MG/2ML IJ SOLN: 4.0000 mg | Freq: Once | INTRAMUSCULAR | Status: DC | PRN

## 2023-06-13 MED ORDER — MIDAZOLAM HCL 2 MG/2ML IJ SOLN
INTRAMUSCULAR | Status: AC
Start: 2023-06-13 — End: ?
  Filled 2023-06-13: qty 2

## 2023-06-13 MED ORDER — LIDOCAINE HCL (CARDIAC) PF 100 MG/5ML IV SOSY
PREFILLED_SYRINGE | INTRAVENOUS | Status: DC | PRN
  Administered 2023-06-13: 50 mg via INTRAVENOUS

## 2023-06-13 MED ORDER — CHLORHEXIDINE GLUCONATE 0.12 % MT SOLN
OROMUCOSAL | Status: AC
  Filled 2023-06-13: qty 15

## 2023-06-13 MED ORDER — PROPOFOL 500 MG/50ML IV EMUL
INTRAVENOUS | Status: DC | PRN
  Administered 2023-06-13: 150 ug/kg/min via INTRAVENOUS
  Administered 2023-06-13 (×2): 50 mg via INTRAVENOUS

## 2023-06-13 MED ORDER — CEFAZOLIN SODIUM-DEXTROSE 2-4 GM/100ML-% IV SOLN
INTRAVENOUS | Status: AC
  Filled 2023-06-13: qty 100

## 2023-06-13 SURGICAL SUPPLY — 14 items
BAG DRAIN SIEMENS DORNER NS (MISCELLANEOUS) ×1 IMPLANT
BAG DRN NS LF (MISCELLANEOUS) ×1
GLOVE BIO SURGEON STRL SZ 6.5 (GLOVE) ×1 IMPLANT
GOWN STRL REUS W/ TWL LRG LVL3 (GOWN DISPOSABLE) ×2 IMPLANT
GOWN STRL REUS W/TWL LRG LVL3 (GOWN DISPOSABLE) ×2
KIT TURNOVER CYSTO (KITS) ×1 IMPLANT
PACK CYSTO AR (MISCELLANEOUS) ×1 IMPLANT
SET CYSTO W/LG BORE CLAMP LF (SET/KITS/TRAYS/PACK) ×1 IMPLANT
SURGILUBE 2OZ TUBE FLIPTOP (MISCELLANEOUS) IMPLANT
SYSTEM UROLIFT 2 CART W/ HNDL (Male Continence) ×1 IMPLANT
SYSTEM UROLIFT 2 CARTRIDGE (Male Continence) IMPLANT
WATER STERILE IRR 1000ML POUR (IV SOLUTION) ×1 IMPLANT
WATER STERILE IRR 3000ML UROMA (IV SOLUTION) ×1 IMPLANT
WATER STERILE IRR 500ML POUR (IV SOLUTION) ×1 IMPLANT

## 2023-06-13 NOTE — Anesthesia Preprocedure Evaluation (Signed)
Anesthesia Evaluation  Patient identified by MRN, date of birth, ID band Patient awake    Reviewed: Allergy & Precautions, H&P , NPO status , Patient's Chart, lab work & pertinent test results, reviewed documented beta blocker date and time   Airway Mallampati: II  TM Distance: >3 FB Neck ROM: full    Dental no notable dental hx. (+) Teeth Intact   Pulmonary shortness of breath, former smoker   Pulmonary exam normal breath sounds clear to auscultation       Cardiovascular Exercise Tolerance: Poor hypertension, On Medications + angina with exertion + CAD and + Past MI   Rhythm:regular Rate:Normal     Neuro/Psych negative neurological ROS  negative psych ROS   GI/Hepatic Neg liver ROS,GERD  Medicated,,  Endo/Other  negative endocrine ROSdiabetes    Renal/GU Renal disease     Musculoskeletal   Abdominal   Peds  Hematology negative hematology ROS (+)   Anesthesia Other Findings   Reproductive/Obstetrics negative OB ROS                             Anesthesia Physical Anesthesia Plan  ASA: 3  Anesthesia Plan: MAC   Post-op Pain Management:    Induction:   PONV Risk Score and Plan:   Airway Management Planned:   Additional Equipment:   Intra-op Plan:   Post-operative Plan:   Informed Consent: I have reviewed the patients History and Physical, chart, labs and discussed the procedure including the risks, benefits and alternatives for the proposed anesthesia with the patient or authorized representative who has indicated his/her understanding and acceptance.       Plan Discussed with: CRNA  Anesthesia Plan Comments:        Anesthesia Quick Evaluation

## 2023-06-13 NOTE — Telephone Encounter (Signed)
Incoming call on triage line, pt's daughter who states that the patient had surgery today and has been unable to void since being home. The patient is c/o severe bladder pressure. Advised pt and daughter to come to the office immediately for evaluation. Daughter voiced understanding.

## 2023-06-13 NOTE — Progress Notes (Signed)
Per Dr Apolinar Junes, may resume Eliquis today. Pt informed

## 2023-06-13 NOTE — Progress Notes (Signed)
Patient presented to clinic today for urgent add-on after undergoing UroLift with Dr. Apolinar Junes this morning.  He has been unable to void and is having abdominal discomfort.  Bladder scan on arrival 347 mL.  Results for orders placed or performed in visit on 06/13/23  Bladder Scan (Post Void Residual) in office  Result Value Ref Range   Scan Result     I offered him Foley catheter placement and he accepted, see below.  Appropriate gross hematuria noted with no evidence of clots or clot retention.  He reported significant improvement in his discomfort after bladder drainage as below.  Simple Catheter Placement  Due to urinary retention patient is present today for a foley cath placement.  Patient was cleaned and prepped in a sterile fashion with betadine and 2% lidocaine jelly was instilled into the urethra. A 18 FR coude foley catheter was inserted, urine return was noted , urine was pink in color, no clots.  The balloon was filled with 10cc of sterile water.  A night bag was attached for drainage. Patient tolerated well, no complications were noted   Performed by: Carman Ching, PA-C   Follow up: Return in about 3 days (around 06/16/2023) for Voiding trial.

## 2023-06-13 NOTE — Addendum Note (Signed)
Addendum  created 06/13/23 1030 by Lysbeth Penner, CRNA   Flowsheet accepted, Intraprocedure Flowsheets edited

## 2023-06-13 NOTE — Anesthesia Postprocedure Evaluation (Signed)
Anesthesia Post Note  Patient: [name]  Procedure(s) Performed: CYSTOSCOPY WITH INSERTION OF UROLIFT (Prostate)  Patient location during evaluation: PACU Anesthesia Type: General Level of consciousness: awake and alert Pain management: pain level controlled Vital Signs Assessment: post-procedure vital signs reviewed and stable Respiratory status: spontaneous breathing, nonlabored ventilation, respiratory function stable and patient connected to nasal cannula oxygen Cardiovascular status: blood pressure returned to baseline and stable Postop Assessment: no apparent nausea or vomiting Anesthetic complications: no   There were no known notable events for this encounter.   Last Vitals:  Vitals:   06/13/23 0723  BP: (!) 140/59  Pulse: 66  Resp: 16  Temp: 36.6 C  SpO2: 100%    Last Pain:  Vitals:   06/13/23 0723  TempSrc: Axillary  PainSc: 0-No pain                 Yevette Edwards

## 2023-06-13 NOTE — H&P (Signed)
06/13/23  RRR CTAB    [name] 1955/11/22 [id number]   Referring provider: Abram Sander, MD [address] [place],  [place] [address]       Chief Complaint  Patient presents with   Results      UDS      HPI: 67 year-old male with ongoing urinary symptoms despite maximum medical therapy.    In the past, he underwent cysto/TRUS in 2022 that showed a relatively non-obstructing appearing prostate and at the time, his TRUS volume was 43. He has been managed on multiple medications, most recently on Flomax, finasteride, and Sanctura, and has refractory symptoms. He also developed an episode of urinary retention after starting myrbetriq.   He returns today for follow up with urodynamics, completed at [facility] on 04/22/2023. He had a maximum bladder capacity of 791. His first sensation was 371. He had no instability noted. He was able to generate a voluntary contraction and void 34 mLs and a max flow rate of 6 mL/sec. Max detrusor pressure while voiding was 32. He had no evidence of EMG activity during voiding phase. His PVR was 756. He did have some slight elevation of the bladder base without trabeculation or reflux.    A spanish translator was utilized for the course of this appointment.    PMH:     Past Medical History:  Diagnosis Date   Arthritis     Chronic kidney disease      KIDNEY TRANSPLANT   Coronary artery disease     Diabetes mellitus without complication (HCC)     Dyspnea     GERD (gastroesophageal reflux disease)     History of blood transfusion     Hypertension     Myocardial infarction [facility])            Surgical History:      Past Surgical History:  Procedure Laterality Date   CATARACT EXTRACTION W/PHACO Right 02/03/2017    Procedure: CATARACT EXTRACTION PHACO AND INTRAOCULAR LENS PLACEMENT (IOC);  Surgeon: Lockie Mola, MD;  Location: [facility] ORS;  Service: Ophthalmology;  Laterality: Right;  [place] 00:35.8 AP% 12.8 CDE 4.57 Fluid lot  # [id number]   COLONOSCOPY WITH PROPOFOL N/A 12/30/2021    Procedure: COLONOSCOPY WITH PROPOFOL;  Surgeon: Wyline Mood, MD;  Location: [facility] ENDOSCOPY;  Service: Gastroenterology;  Laterality: N/A;   CORONARY ANGIOPLASTY        STENTS X 4   CORONARY ARTERY BYPASS GRAFT       ENDARTERECTOMY Left 09/06/2019    Procedure: ENDARTERECTOMY CAROTID;  Surgeon: Annice Needy, MD;  Location: [facility] ORS;  Service: Vascular;  Laterality: Left;   EYE SURGERY Bilateral      cataract   HIP SURGERY       KIDNEY TRANSPLANT        2016          Home Medications:  Allergies as of 05/11/2023         Reactions    Oxybutynin Other (See Comments)    Incomplete bladder emptying            Medication List           Accurate as of May 11, 2023 12:18 PM. If you have any questions, ask your nurse or doctor.              aspirin EC 81 MG tablet Take 1 tablet (81 mg total) by mouth daily at 6 (six) AM.    atorvastatin 40  MG tablet Commonly known as: LIPITOR 40 mg daily at 6 PM.    CellCept 250 MG capsule Generic drug: mycophenolate Take 500 mg by mouth 2 (two) times daily.    Eliquis 5 MG Tabs tablet Generic drug: apixaban Take 5 mg by mouth 2 (two) times daily.    finasteride 5 MG tablet Commonly known as: PROSCAR TAKE 1 TABLET BY MOUTH ONCE DAILY.    FreeStyle Libre 2 Sensor Misc by Does not apply route.    HumaLOG KwikPen 100 UNIT/ML KwikPen Generic drug: insulin lispro Inject 10-12 Units into the skin 3 (three) times daily. Inject 10u daily at breakfast, 10u at lunch and up to 12u at supper per SSI    Insulin Pen Needle 33G X 4 MM Misc To use with insulin pen 4 times per day. E 13.9    Lantus SoloStar 100 UNIT/ML Solostar Pen Generic drug: insulin glargine Inject 24 Units into the skin at bedtime.    linagliptin 5 MG Tabs tablet Commonly known as: TRADJENTA Take 5 mg by mouth daily.    losartan 50 MG tablet Commonly known as: COZAAR Take 50 mg by mouth daily.     omeprazole 20 MG capsule Commonly known as: PRILOSEC Take 20 mg by mouth daily as needed.    Omnipod DASH Pods (Gen 4) Misc    Prograf 1 MG capsule Generic drug: tacrolimus Take 0.5 mg by mouth at bedtime.    tamsulosin 0.4 MG Caps capsule Commonly known as: FLOMAX Take 0.4 mg by mouth 2 (two) times daily.    trospium 20 MG tablet Commonly known as: SANCTURA Take 1 tablet (20 mg total) by mouth at bedtime. What changed: when to take this             Allergies:  Allergies       Allergies  Allergen Reactions   Oxybutynin Other (See Comments)      Incomplete bladder emptying        Family History:      Family History  Problem Relation Age of Onset   Drug abuse Sister     Drug abuse Brother            Social History:  reports that he has quit smoking. He has never used smokeless tobacco. He reports that he does not drink alcohol and does not use drugs.     Physical Exam: BP (!) 157/65   Pulse 67   Ht 5\' 8"  (1.727 m)   Wt 165 lb (74.8 kg)   BMI 25.09 kg/m   Constitutional:  Alert and oriented, No acute distress. HEENT: [place] AT, moist mucus membranes.  Trachea midline, no masses. Neurologic: Grossly intact, no focal deficits, moving all 4 extremities. Psychiatric: Normal mood and affect.     Assessment & Plan:     1. BPH with outlet obstruction - His pressure flow is somewhat limited, given that he was barely able to urinate - Given his refractory ongoing urinary symptoms, we think it is reasonable to try an outlet procedure - Discussed UroLift versus TURP and risks and benefits of each - He is considering UroLift due to its favorable side effect profile compared to other surgical options. Provided literature for further understanding.  One of his primary concerns is postoperative urinary incontinence, with UroLift is extremely low. - Possible side effects of UroLift include blood in urine, infection, urinary retention, or a complication with the clip.  -  Scheduled for a pre-op urinalysis and culture  Return for UroLift.   I have reviewed the above documentation for accuracy and completeness, and I agree with the above.    [name], MD   [facility] [address] [place], [place] [address] [phone]

## 2023-06-13 NOTE — Transfer of Care (Signed)
Immediate Anesthesia Transfer of Care Note  Patient: [name]  Procedure(s) Performed: CYSTOSCOPY WITH INSERTION OF UROLIFT (Prostate)  Patient Location: PACU  Anesthesia Type:General  Level of Consciousness: drowsy  Airway & Oxygen Therapy: Patient Spontanous Breathing  Post-op Assessment: Report given to RN and Post -op Vital signs reviewed and stable  Post vital signs: Reviewed and stable  Last Vitals:  Vitals Value Taken Time  BP 109/45 06/13/23 0845  Temp    Pulse 63 06/13/23 0848  Resp 6 06/13/23 0848  SpO2 99 % 06/13/23 0848  Vitals shown include unfiled device data.  Last Pain:  Vitals:   06/13/23 0723  TempSrc: Axillary  PainSc: 0-No pain         Complications: There were no known notable events for this encounter.

## 2023-06-13 NOTE — Op Note (Signed)
06/13/23   Preoperative diagnosis: BPH with obstructive symptomatology   Postoperative diagnosis: BPH with obstructive symptomatology   Principal procedure: Urolift procedure, with the placement of 4 implants.   Surgeon: [name]   Anesthesia: MAC   Complications: None   Drains: None   Estimated blood loss: < 5 mL   Indications: 67 year-old male with obstructive symptomatology secondary to BPH.  The patient's symptoms have progressed, and he has requested further management.  Management options including TURP with resection/ablation of the prostate as well as Urolift were discussed.  The patient has chosen to have a Urolift procedure.  He has been instructed to the procedure as well as risks and complications which include but are not limited to infection, bleeding, and inadequate treatment with the Urolift procedure alone, anesthetic complications, among others.  He understands these and desires to proceed.   Findings: Using the 17 French cystoscope, urethra and bladder were inspected.  There were no urethral lesions.  Prostatic urethra was obstructed secondary to bilobar hypertrophy.  The bladder was inspected circumferentially.  This revealed normal findings.   Description of procedure: The patient was properly identified in the holding area.  He received preoperative IV antibiotics.  He was taken to the operating room where MAC.  He is placed in the dorsolithotomy position.  Genitalia and perineum were prepped and draped.  Proper timeout was performed.   A 57F cystoscope was inserted into the bladder with findings as described above.  The 1st pair of implants were placed at the bladder neck ~1.5 cm from the bladder Neck. The 2nd pair of implants were placed at the level of the verumontanum.   A final cystoscopy was conducted first to inspect the location and state of each implant and second, to confirm the presence of a continuous anterior channel was present through the  prostatic urethra with irrigation flow turned off.    4 implants were delivered in total.    Following this, the scope was removed.  After anesthetic reversal he was transported to the PACU in stable condition.  He tolerated the procedure well.   Plan: He will follow-up with me in 4 to 6 weeks for IPSS/PVR.

## 2023-06-13 NOTE — Discharge Instructions (Signed)
Urolift Post-Operative Instructions     Patient Expectations   1. Mild blood in your urine for about 1 week.  2. Urinary buring, frequency, and urgency for 10 days.  3. Mild pelvic pain 1-2 weeks.     Return to Activity     1. Drink water post procedure.  2. Take meds as needed.  Tylenol and/or Motrin is most helpful.  You may also by Pyridium/Azo over-the-counter for urinary burning.  3. No lifting or straining 48hrs.  4. Other activity when they feel up to it.  

## 2023-06-16 ENCOUNTER — Ambulatory Visit: Payer: Medicare Other | Admitting: Physician Assistant

## 2023-06-16 ENCOUNTER — Encounter: Payer: Self-pay | Admitting: Physician Assistant

## 2023-06-16 VITALS — BP 104/50 | HR 73 | Ht 65.0 in | Wt 160.0 lb

## 2023-06-16 DIAGNOSIS — N138 Other obstructive and reflux uropathy: Secondary | ICD-10-CM

## 2023-06-16 DIAGNOSIS — N401 Enlarged prostate with lower urinary tract symptoms: Secondary | ICD-10-CM | POA: Diagnosis not present

## 2023-06-16 DIAGNOSIS — Z940 Kidney transplant status: Principal | ICD-10-CM

## 2023-06-16 DIAGNOSIS — Z79899 Other long term (current) drug therapy: Principal | ICD-10-CM

## 2023-06-16 LAB — BLADDER SCAN AMB NON-IMAGING: Scan Result: 0

## 2023-06-16 NOTE — Patient Instructions (Signed)
Congratulations on your recent Urolift procedure! As discussed in clinic today, some common postoperative findings include: Burning or pain with urination Lower abdominal/pelvic pain Blood in the urine Urinary leakage Typically, these are mild and resolve within 2-4 weeks of surgery. If you have any concerns, please contact our office.  

## 2023-06-16 NOTE — Progress Notes (Signed)
Afternoon follow-up  Patient returned to clinic this afternoon for PVR. He has been able to urinate multiple times without difficulty.  PVR 0 mL.  Results for orders placed or performed in visit on 06/16/23  Bladder Scan (Post Void Residual) in office  Result Value Ref Range   Scan Result 0 ml     Voiding trial passed. Counseled patient on normal postop expectations s/p UroLift including dysuria, hematuria, urgency, and pelvic discomfort.  We discussed that these should be rather minimal and resolved within 2 to 4 weeks.  He expressed understanding.  Follow up: Return in about 4 weeks (around 07/14/2023) for Postop f/u with Dr. Apolinar Junes.

## 2023-06-16 NOTE — Progress Notes (Signed)
Catheter Removal  Patient is present today for a catheter removal.  9 ml of water was drained from the balloon. A 18 FR foley cath was removed from the bladder, no complications were noted. Patient tolerated well.  Performed by: Shanicqua Coldren H RMA  Follow up/ Additional notes: Afternoon PVR

## 2023-06-23 ENCOUNTER — Ambulatory Visit: Admit: 2023-06-23 | Discharge: 2023-06-23 | Payer: MEDICARE

## 2023-06-23 DIAGNOSIS — Z940 Kidney transplant status: Principal | ICD-10-CM

## 2023-06-23 DIAGNOSIS — Z79899 Other long term (current) drug therapy: Principal | ICD-10-CM

## 2023-06-23 MED ORDER — LINAGLIPTIN 5 MG TABLET
ORAL_TABLET | Freq: Every day | ORAL | 3 refills | 90 days | Status: CP
Start: 2023-06-23 — End: 2024-06-22

## 2023-06-23 MED ORDER — LOSARTAN 50 MG TABLET
ORAL_TABLET | Freq: Every evening | ORAL | 0 refills | 360 days | Status: CP
Start: 2023-06-23 — End: 2024-06-22

## 2023-06-24 ENCOUNTER — Ambulatory Visit: Payer: Medicare Other | Admitting: Cardiovascular Disease

## 2023-07-19 ENCOUNTER — Ambulatory Visit: Payer: Medicare Other | Admitting: Urology

## 2023-07-19 DIAGNOSIS — N138 Other obstructive and reflux uropathy: Secondary | ICD-10-CM | POA: Diagnosis not present

## 2023-07-19 DIAGNOSIS — N401 Enlarged prostate with lower urinary tract symptoms: Secondary | ICD-10-CM | POA: Diagnosis not present

## 2023-07-19 LAB — BLADDER SCAN AMB NON-IMAGING: Scan Result: 14

## 2023-07-19 NOTE — Progress Notes (Signed)
I,[name],acting as a scribe for Leonard Scotland, MD.,have documented all relevant documentation on the behalf of Leonard Scotland, MD,as directed by  Leonard Scotland, MD while in the presence of Leonard Scotland, MD.  07/19/2023 1:04 PM   [name] 26-Jun-1956 [id number]  Referring provider: Abram Sander, MD [address] [place],  [place] [address]  Chief Complaint  Patient presents with   Post-op Follow-up    HPI: 67 year-old male presents today for follow-up after UroLift.  He underwent the procedure on 06/13/2023. Only four implants were delivered. His postoperative course was complicated by urinary retention on the same day of the procedure, later in the afternoon. A few days later he underwent a voiding trial successfully with a PVR of zero.   Preoperatively he'd been on multiple medications including Finasteride, Flomax, and Sanctura, with refractory symptoms. TRUS volume was 43. He did undergo urodynamics prior to the procedure. He essentially had a limited pressure flow study with a markedly elevated PVR. It was not completely diagnostic. He elected to pursue UroLift despite this.  He reports about a 50% improvement on all symptoms since the procedure and still progressing. He urinates throughout the day about every 2 hours. He can hold it better and his stream is improved. He can stand again now to urinate.  He mentioned having pain for a few days following riding the bike at the gym.    IPSS     Row Name 07/19/23 1100         International Prostate Symptom Score   How often have you had the sensation of not emptying your bladder? More than half the time     How often have you had to urinate less than every two hours? Less than half the time     How often have you found you stopped and started again several times when you urinated? Not at All     How often have you found it difficult to postpone urination? Not at All     How often have you had a weak  urinary stream? Not at All     How often have you had to strain to start urination? Not at All     How many times did you typically get up at night to urinate? 5 Times     Total IPSS Score 11       Quality of Life due to urinary symptoms   If you were to spend the rest of your life with your urinary condition just the way it is now how would you feel about that? Mostly Satisfied            Score:  1-7 Mild 8-19 Moderate 20-35 Severe Results for orders placed or performed in visit on 07/19/23  Bladder Scan (Post Void Residual) in office  Result Value Ref Range   Scan Result 14 ml     PMH: Past Medical History:  Diagnosis Date   Aortic atherosclerosis (HCC)    Arthritis    BPH (benign prostatic hyperplasia)    Carotid arterial disease (HCC)    a.) s/p LEFT CEA on 09/06/2019   Coronary artery disease    a.) MI with stents x 2 in 2004; b.) MI with stent x 1 2005; c.) s/p CABG x 2 06/02/2006 (LIMA-LAD, SVG-PDA); d.) LHC/PCI 08/25/2007: 80% oD1 (2.5 x 12 mm Xience V DES)   DDD (degenerative disc disease), lumbar    Dyspnea    ESRD (end stage  renal disease) (HCC)    a.) s/p RIGHT renal 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/2015   GERD (gastroesophageal reflux disease)    History of blood transfusion    Hypertension    Long term current use of aspirin    Long term current use of immunosuppressive drug    a.) mycophenolate + tacrolimus   Myocardial infarction [facility]) 2004   a.) details unclear; stents x 2 (unknown type/location)   Myocardial infarction (HCC) 2005   a.) details unclear; stent x 1 (unknown type/location)   On apixaban therapy    PAF (paroxysmal atrial fibrillation) (HCC)    a.) CHA2DS2-VASc = 5 (age, HTN, vascular disease history/MI, T2DM) as of 06/10/2023; b.) cardiac rate/rhythm maintained intrinsically without pharmacological intervention; chronically anticoagulated using apixaban   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   T2DM (type 2 diabetes mellitus) (HCC)    Unstable  angina (HCC)    Vitamin D deficiency     Surgical History: Past Surgical History:  Procedure Laterality Date   CATARACT EXTRACTION Bilateral    CATARACT EXTRACTION W/PHACO Right 02/03/2017   Procedure: CATARACT EXTRACTION PHACO AND INTRAOCULAR LENS PLACEMENT (IOC);  Surgeon: Lockie Mola, MD;  Location: [facility] ORS;  Service: Ophthalmology;  Laterality: Right;  [place] 00:35.8 AP% 12.8 CDE 4.57 Fluid lot # [id number]   COLONOSCOPY WITH PROPOFOL N/A 12/30/2021   Procedure: COLONOSCOPY WITH PROPOFOL;  Surgeon: Wyline Mood, MD;  Location: [facility] ENDOSCOPY;  Service: Gastroenterology;  Laterality: N/A;   CORONARY ANGIOPLASTY WITH STENT PLACEMENT Left 2004   stents x 2   CORONARY ANGIOPLASTY WITH STENT PLACEMENT Left 2005   stent x 1   CORONARY ANGIOPLASTY WITH STENT PLACEMENT Left 08/25/2007   Procedure: CORONARY ANGIOPLASTY WITH STENT PLACEMENT; Location: [facility]; Surgeon: Lennart Pall, MD   CORONARY ARTERY BYPASS GRAFT N/A 06/08/2006   Procedure: CORONARY ARTERY BYPASS GRAFT; Location: [facility]; Surgeon: Marshell Garfinkel, MD   CYSTOSCOPY WITH INSERTION OF UROLIFT N/A 06/13/2023   Procedure: CYSTOSCOPY WITH INSERTION OF UROLIFT;  Surgeon: Leonard Scotland, MD;  Location: [facility] ORS;  Service: Urology;  Laterality: N/A;   ENDARTERECTOMY Left 09/06/2019   Procedure: ENDARTERECTOMY CAROTID;  Surgeon: Annice Needy, MD;  Location: [facility] ORS;  Service: Vascular;  Laterality: Left;   HIP FRACTURE SURGERY     KIDNEY TRANSPLANT Right 05/10/2015   LEFT HEART CATH AND CORONARY ANGIOGRAPHY Left 05/20/2006   Procedure: LEFT HEART CATH AND CORONARY ANGIOGRAPHY; Location: [facility]; Surgeon: Adrian Blackwater, MD   LEFT HEART CATH AND CORS/GRAFTS ANGIOGRAPHY Left 08/10/2007   Procedure: LEFT HEART CATH AND CORS/GRAFTS ANGIOGRAPHY; Location: [facility]; Surgeon: Adrian Blackwater, MD   LEFT HEART CATH AND CORS/GRAFTS ANGIOGRAPHY Left 05/04/2011   Procedure: LEFT HEART CATH AND CORS/GRAFTS ANGIOGRAPHY; Location: [facility]; Surgeon: Adrian Blackwater, MD    Home Medications:  Allergies as of 07/19/2023       Reactions   Oxybutynin Other (See Comments)   Incomplete bladder emptying        Medication List        Accurate as of July 19, 2023  1:04 PM. If you have any questions, ask your nurse or doctor.          STOP taking these medications    HYDROcodone-acetaminophen 5-325 MG tablet Commonly known as: NORCO/VICODIN       TAKE these medications    ascorbic acid 500 MG tablet Commonly known as: VITAMIN C Take 500 mg by mouth daily.   aspirin EC 81 MG tablet Take 1 tablet (81 mg total) by mouth daily at 6 (  six) AM.   atorvastatin 40 MG tablet Commonly known as: LIPITOR Take 40 mg by mouth at bedtime.   CellCept 250 MG capsule Generic drug: mycophenolate Take 500 mg by mouth 2 (two) times daily.   Eliquis 5 MG Tabs tablet Generic drug: apixaban Take 5 mg by mouth 2 (two) times daily.   finasteride 5 MG tablet Commonly known as: PROSCAR TAKE 1 TABLET BY MOUTH ONCE DAILY.   FreeStyle Libre 2 Sensor Misc by Does not apply route.   HumaLOG KwikPen 100 UNIT/ML KwikPen Generic drug: insulin lispro Inject 10-12 Units into the skin 3 (three) times daily. Inject 10 units daily at breakfast, 10 units at lunch and up to 10 units or less at supper per SSI   Insulin Pen Needle 33G X 4 MM Misc To use with insulin pen 4 times per day. E 13.9   Lantus SoloStar 100 UNIT/ML Solostar Pen Generic drug: insulin glargine Inject 24 Units into the skin at bedtime.   linagliptin 5 MG Tabs tablet Commonly known as: TRADJENTA Take 5 mg by mouth daily.   losartan 50 MG tablet Commonly known as: COZAAR Take 50 mg by mouth in the morning and at bedtime.   omeprazole 20 MG capsule Commonly known as: PRILOSEC Take 20 mg by mouth daily.   Omnipod DASH Pods (Gen 4) Misc   Prograf 1 MG capsule Generic drug: tacrolimus Take 0.5-1 mg by mouth 2 (two) times daily.   tamsulosin 0.4 MG Caps capsule Commonly  known as: FLOMAX Take 0.4 mg by mouth 2 (two) times daily.   trospium 20 MG tablet Commonly known as: SANCTURA Take 1 tablet (20 mg total) by mouth at bedtime.   Vitamin D 50 MCG (2000 UT) Caps Take 2,000 Units by mouth daily.        Allergies:  Allergies  Allergen Reactions   Oxybutynin Other (See Comments)    Incomplete bladder emptying    Family History: Family History  Problem Relation Age of Onset   Drug abuse Sister    Drug abuse Brother     Social History:  reports that he has quit smoking. He has never used smokeless tobacco. He reports that he does not drink alcohol and does not use drugs.   Physical Exam: There were no vitals taken for this visit.  Constitutional:  Alert and oriented, No acute distress. HEENT: [place] AT, moist mucus membranes.  Trachea midline, no masses. Neurologic: Grossly intact, no focal deficits, moving all 4 extremities. Psychiatric: Normal mood and affect.   Assessment & Plan:    1. BPH with urinary obstruction  - Status post UroLift. Symptoms are improving. Plan to continue the OAB medications (Finasteride, Flomax, Sinctura) for now. Will look into weaning off of them at his next visit in 6 months. He is in agreement with this as well.   Return in about 6 months (around 01/17/2024) for IPSS, PVR, PSA with a PA.  I have reviewed the above documentation for accuracy and completeness, and I agree with the above.   Leonard Scotland, MD    [facility] [address] [place], [place] [address] [phone]

## 2023-08-02 ENCOUNTER — Encounter: Payer: Self-pay | Admitting: Cardiovascular Disease

## 2023-08-04 ENCOUNTER — Telehealth: Payer: Self-pay

## 2023-08-04 NOTE — Telephone Encounter (Signed)
 Called from Patient Assistance for his Eliquis. Please let them know the update on this. Thank you.

## 2023-08-05 NOTE — Telephone Encounter (Signed)
 Looks like it was signed on 11/27 and should have been faxed, patient will need to call company to make sure they have received it and if not we can refax it

## 2023-08-05 NOTE — Telephone Encounter (Signed)
Portal message has been sent to patient.

## 2023-08-06 ENCOUNTER — Emergency Department
Admit: 2023-08-06 | Discharge: 2023-08-06 | Disposition: A | Discharge status: Home / Self Care | Payer: PRIVATE HEALTH INSURANCE | Attending: Family

## 2023-08-06 DIAGNOSIS — R233 Spontaneous ecchymoses: Principal | ICD-10-CM

## 2023-08-06 NOTE — ED Provider Notes (Signed)
 [facility] Emergency Department Provider Note   ED Clinical Impression   Final diagnoses:  Spontaneous hematoma of hand (Primary)    Initial Impression, ED Course, Assessment and Plan   Impression: Spontaneous hematoma.  THONG FEENY is a 68 y.o. male with pmhx of T2DM, CAD, A-fib (on eliquis ), Kidney txp (2016), HTN,  who presents to ED for evaluation of right hand swelling and bruising onset this morning.  Patient denies any injury or trauma.  No significant pain.  On exam, patient is very well-appearing, vital signs notable for above pressure 178/72, otherwise normal.  Right hand with some mild diffuse edema and ecchymosis noted to the dorsum of the hand, with a spongy small nodular lesion just proximal to his third and fourth MCPs.  Suspect a phlebitis, with surrounding bleeding, from a spontaneous hematoma.  No signs of infection at this time.  No signs of compartment syndrome.  An x-ray was paced from triage and is negative for any bony injury.  Recommend treatment with elevation, compression (Ace wrap applied), and holding 1 dose of Eliquis  tonight.  Tylenol  as needed for pain.  Discussed his symptoms should resolve on their own within the next week or so.  ED return precautions discussed for any worsening swelling, pain, fevers, redness.  Patient is agreeable this plan.  ____________________________________________  Time seen: August 06, 2023 5:58 PM  I have reviewed the triage vital signs and the nursing notes.  This visit was not staffed with an ED attending.  Additional Medical Decision Making   I have reviewed the vital signs and the nursing notes. Labs and radiology results that were available during my care of the patient were independently reviewed by me and considered in my medical decision making.  I independently visualized the radiology images.    History   Chief Complaint Hand Pain   HPI  GREYSEN DEVINO is a 68 y.o. male with pmhx of  T2DM, CAD, A-fib (on eliquis ), Kidney txp (2016), HTN,  who presents to ED for evaluation of right hand swelling and bruising onset this morning.  Patient denies any injury or trauma.  States he went to bed last night without any symptoms and awoke this morning with noticing some swelling and bruising to the distal portion of his dorsal right hand.  States since then the swelling has extended more proximally.  States he does not feel any pain unless you press directly on it.  No numbness, tingling, weakness.  He is right-hand dominant.  Denies any recent injury, venipuncture.  Denies any other bleeding or bruising.  He does take Eliquis  for his A-fib.   Past Medical History:  Diagnosis Date  . Aspiration pneumonia (CMS-HCC) 2011   while comatose  . Atrial fibrillation (CMS-HCC)   . Coronary artery disease 2004   open heart surgery 06/30/2006 2-V bypass; MI 2004 2 stents, MI 2005 1 stent; 1 stent 1st diagonal 08/25/2007  . Diabetes mellitus (CMS-HCC)    on insulin   . Diabetic nephropathy (CMS-HCC)   . End-stage renal disease (ESRD) (CMS-HCC)   . Hypercholesteremia   . Hypertension   . Immunosuppression (CMS-HCC) 11/11/2015  . Kidney 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/10/2015 05/12/2015  . Myocardial infarct, old   . Pulmonary scarring 2011   from prior CT placement  . 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 Vitamin D deficiency     Patient Active Problem List  Diagnosis  . Coronary atherosclerosis of native coronary artery  . Type II  diabetes mellitus (CMS-HCC)  . Essential hypertension  . Diabetes mellitus (CMS-HCC)  . Coronary artery disease  . Vitamin D deficiency  . Hypertension  . Hypercholesteremia  . Pulmonary scarring  . Kidney 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/10/2015  . Paroxysmal atrial fibrillation (CMS-HCC)  . Immunosuppression (CMS-HCC)  . Aftercare following organ transplant    Past Surgical History:  Procedure Laterality Date  . AV FISTULA PLACEMENT,  BRACHIOCEPHALIC Left 2008   with hardware  . CATARACT EXTRACTION Left 2014  . CHEST TUBE INSERTION Bilateral 2011   while in a coma  . CORONARY ARTERY BYPASS GRAFT  2007   2 vessel bypass  . HIP FRACTURE SURGERY Left 11/2009   with hardware  . NASO OR ORO GASTRIC TUBE PLACE [facility] HISTORICAL RESULT) N/A 2011   now removed  . NEPHRECTOMY TRANSPLANTED ORGAN    . PR COLONOSCOPY FLX DX W/COLLJ SPEC WHEN PFRMD N/A 12/31/2014   Procedure: COLONOSCOPY, FLEXIBLE, PROXIMAL TO SPLENIC FLEXURE; DIAGNOSTIC, W/WO COLLECTION SPECIMEN BY BRUSH OR WASH;  Surgeon: Sandrea CHRISTELLA Mood, MD;  [facility]: [facility];  Service: Gastroenterology  . PR COLONOSCOPY FLX DX W/COLLJ SPEC WHEN PFRMD Left 11/25/2021   Procedure: COLONOSCOPY, FLEXIBLE, PROXIMAL TO SPLENIC FLEXURE; DIAGNOSTIC, W/WO COLLECTION SPECIMEN BY BRUSH OR WASH;  Surgeon: Burnard Almarie Budd, MD;  [facility]: [facility];  Service: Gastroenterology  . PR COLSC FLX W/RMVL OF TUMOR POLYP LESION SNARE TQ N/A 04/20/2018   Procedure: COLONOSCOPY FLEX; W/REMOV TUMOR/LES BY SNARE;  Surgeon: Rodger Phlegm, MD;  [facility]: [facility];  Service: Gastroenterology  . PR TRANSPLANT,PREP CADAVER RENAL GRAFT N/A 05/10/2015   Procedure: [address] [facility] STD PREP CAD DONR RENAL ALLOGFT PRIOR TO TRNSPLNT, INCL DISSEC/REM PERINEPH FAT, DIAPH/RTPER ATTAC;  Surgeon: Marsa VEAR Boss, MD;  Location: MAIN OR [facility];  Service: Transplant  . PR TRANSPLANTATION OF KIDNEY N/A 05/10/2015   Procedure: RENAL ALLOTRANSPLANTATION, IMPLANTATION OF GRAFT; WITHOUT RECIPIENT NEPHRECTOMY;  Surgeon: Marsa VEAR Boss, MD;  Location: MAIN OR [facility];  Service: Transplant  . TRACHEAL SURGERY  2011   h/o tracheostomy now removed  . WRIST FRACTURE SURGERY Left 11/2009   with hardware    No current facility-administered medications for this encounter.  Current Outpatient Medications:  .  apixaban  (ELIQUIS ) 5 mg Tab, Take 1 tablet (5 mg total) by mouth Two (2)  times a day., Disp: , Rfl:  .  ascorbic acid, vitamin C, (VITAMIN C) 500 MG tablet, Take 1 tablet (500 mg total) by mouth daily., Disp: , Rfl:  .  aspirin  (ECOTRIN) 81 MG tablet, TAKE ONE (1) TABLET BY MOUTH EVERY DAY, Disp: 90 tablet, Rfl: 4 .  atorvastatin  (LIPITOR) 40 MG tablet, TOME 1 TABLETA POR LA BOCA  DIARIAMENTE CON LA COMIDA  DE LA TARDE, Disp: 90 tablet, Rfl: 4 .  blood sugar diagnostic Strp, ge glucose test strips to check bs 4 times per day. E 13.9, Disp: 120 strip, Rfl: 6 .  cholecalciferol , vitamin D3-50 mcg, 2,000 unit,, 50 mcg (2,000 unit) cap, Take 1 capsule (50 mcg total) by mouth daily., Disp: 90 capsule, Rfl: 3 .  finasteride  (PROSCAR ) 5 mg tablet, Take 1 tablet (5 mg total) by mouth daily., Disp: , Rfl:  .  flash glucose sensor (FREESTYLE LIBRE 14 DAY SENSOR) kit, by Miscellaneous route., Disp: , Rfl:  .  insulin  glargine (LANTUS  SOLOSTAR) 100 unit/mL (3 mL) injection pen, Inject 0.24 mL (24 Units total) under the skin nightly. DM E11.9, Disp: 10 pen, Rfl: 1 .  insulin  lispro (HUMALOG  KWIKPEN INSULIN ) 100 unit/mL injection pen, Inject 10 units at breakfast, 10 units at lunch, and SS scale units at dinner. Inject 0-12 units based on sliding scale for BG > 150., Disp: 5 pen, Rfl: 5 .  lancets Misc, Use to check blood sugars 4 times per day., Disp: 200 each, Rfl: 11 .  linagliptin  (TRADJENTA ) 5 mg Tab, Take 1 tablet (5 mg total) by mouth daily., Disp: 90 tablet, Rfl: 3 .  losartan  (COZAAR ) 50 MG tablet, Take 1 tablet (50 mg total) by mouth nightly., Disp: 360 tablet, Rfl: 0 .  mycophenolate  (CELLCEPT ) 250 mg capsule, Take 2 capsules (500 mg total) by mouth two (2) times a day., Disp: 360 capsule, Rfl: 3 .  omeprazole (PRILOSEC) 20 MG capsule, Take 1 capsule (20 mg total) by mouth daily., Disp: , Rfl:  .  OMNIPOD 5 PACK PODS, , Disp: , Rfl:  .  pen needle, diabetic (ADVOCATE PEN NEEDLE) 33 gauge x 5/32 Ndle, To use with insulin  pen 4 times per day. E 13.9, Disp: 120 each, Rfl: 6 .   pen needle, diabetic (PEN NEEDLE) 31 gauge x 1/4 Ndle, E11.9; check blood glucose TID AC, Disp: 100 each, Rfl: 5 .  tacrolimus  (PROGRAF ) 0.5 MG capsule, Take 1 capsule (0.5 mg total) by mouth nightly., Disp: 90 capsule, Rfl: 3 .  tacrolimus  (PROGRAF ) 1 MG capsule, Take 1 capsule (1 mg total) by mouth in the morning., Disp: 90 capsule, Rfl: 3 .  tamsulosin  (FLOMAX ) 0.4 mg capsule, Take 1 capsule (0.4 mg total) by mouth two (2) times a day., Disp: 180 capsule, Rfl: 3  Allergies Oxybutynin   Family History  Problem Relation Age of Onset  . Diabetes Sister   . Diabetes Brother   . Kidney disease Brother     Social History Social History   Tobacco Use  . Smoking status: Former    Current packs/day: 0.00    Average packs/day: 1 pack/day for 15.0 years (15.0 ttl pk-yrs)    Types: Cigarettes    Start date: 12/14/1981    Quit date: 12/14/1996    Years since quitting: 26.6  . Smokeless tobacco: Never  Vaping Use  . Vaping status: Never Used  Substance Use Topics  . Alcohol use: No    Alcohol/week: 0.0 standard drinks of alcohol  . Drug use: No    Review of Systems  A complete review of systems was performed and is negative other than as addressed in the HPI.  Physical Exam   ED Triage Vitals  Enc Vitals Group     BP 08/06/23 1609 170/68     Heart Rate 08/06/23 1609 63     SpO2 Pulse --      Resp 08/06/23 1609 18     Temp 08/06/23 1609 36.8 C (98.3 F)     Temp Source 08/06/23 1609 Oral     SpO2 08/06/23 1609 98 %     Weight 08/06/23 1616 76.2 kg (167 lb 14.4 oz)     Height --      Head Circumference --      Peak Flow --      Pain Score --      Pain Loc --      Pain Education --      Exclude from Growth Chart --     Constitutional: Alert and oriented. Well appearing and in no distress. Eyes: Conjunctivae are normal. ENT      Head: Normocephalic and atraumatic.      Mouth/Throat:  Mucous membranes are moist.      Neck: Supple Musculoskeletal:     R hand: Full  range of motion without difficulty.  No focal bony tenderness to palpation.  He does have a spongy firm nodule noted to the distal dorsum of the hand between his third and fourth digits, with some mild edema and ecchymosis surrounding proximally. Neurologic: Normal speech and language. No gross focal neurologic deficits are appreciated. Skin: Skin is warm, dry and intact. No rash noted. Psychiatric: Mood and affect are normal. Speech and behavior are normal.  Radiology   XR Hand Right PA Lateral and Oblique  Final Result  -No displaced fracture or malalignment.  -Moderate soft tissue edema along the dorsum of the hand which may be infectious, traumatic, or reactive. Correlate clinically.      Pertinent labs & imaging results that were available during my care of the patient were reviewed by me and considered in my medical decision making (see chart for details).     Fernande Palma D, FNP 08/06/23 AMOS

## 2023-08-06 NOTE — ED Triage Notes (Signed)
 Pt stating he woke up this morning with edema in right hand. Pt with bruising noted. Pt denies injuries and has no pain unless he presses on his hand.

## 2023-08-11 ENCOUNTER — Inpatient Hospital Stay: Admit: 2023-08-11 | Discharge: 2023-08-12 | Payer: PRIVATE HEALTH INSURANCE

## 2023-09-19 ENCOUNTER — Ambulatory Visit: Payer: Medicare Other | Admitting: Cardiovascular Disease

## 2023-10-10 ENCOUNTER — Ambulatory Visit: Payer: Medicare Other | Admitting: Family Medicine

## 2023-10-13 ENCOUNTER — Encounter: Payer: Self-pay | Admitting: Cardiovascular Disease

## 2023-10-13 ENCOUNTER — Ambulatory Visit (INDEPENDENT_AMBULATORY_CARE_PROVIDER_SITE_OTHER): Payer: Medicare Other | Admitting: Cardiovascular Disease

## 2023-10-13 VITALS — BP 117/68 | HR 65 | Ht 68.0 in | Wt 164.0 lb

## 2023-10-13 DIAGNOSIS — I259 Chronic ischemic heart disease, unspecified: Secondary | ICD-10-CM

## 2023-10-13 DIAGNOSIS — I509 Heart failure, unspecified: Secondary | ICD-10-CM

## 2023-10-13 DIAGNOSIS — E7800 Pure hypercholesterolemia, unspecified: Secondary | ICD-10-CM

## 2023-10-13 DIAGNOSIS — I10 Essential (primary) hypertension: Secondary | ICD-10-CM | POA: Diagnosis not present

## 2023-10-13 DIAGNOSIS — I6522 Occlusion and stenosis of left carotid artery: Secondary | ICD-10-CM | POA: Diagnosis not present

## 2023-10-13 DIAGNOSIS — I25708 Atherosclerosis of coronary artery bypass graft(s), unspecified, with other forms of angina pectoris: Secondary | ICD-10-CM

## 2023-10-13 DIAGNOSIS — I480 Paroxysmal atrial fibrillation: Secondary | ICD-10-CM

## 2023-10-13 NOTE — Progress Notes (Signed)
 Cardiology Office Note   Date:  10/13/2023   ID:  Keyshon, Stein 1956/05/02, MRN [id number]  PCP:  Abram Sander, MD  Cardiologist:  Adrian Blackwater, MD      History of Present Illness: [name] is a 68 y.o. male who presents for  Chief Complaint  Patient presents with   Follow-up    4 month follow up    Has occasional chest pain  Chest Pain  This is a new problem. The onset quality is sudden. The pain is present in the lateral region. The pain is at a severity of 3/10. The quality of the pain is described as dull.      Past Medical History:  Diagnosis Date   Aortic atherosclerosis (HCC)    Arthritis    BPH (benign prostatic hyperplasia)    Carotid arterial disease (HCC)    a.) s/p LEFT CEA on 09/06/2019   Coronary artery disease    a.) MI with stents x 2 in 2004; b.) MI with stent x 1 2005; c.) s/p CABG x 2 06/02/2006 (LIMA-LAD, SVG-PDA); d.) LHC/PCI 08/25/2007: 80% oD1 (2.5 x 12 mm Xience V DES)   DDD (degenerative disc disease), lumbar    Dyspnea    ESRD (end stage renal disease) (HCC)    a.) s/p RIGHT renal 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/2015   GERD (gastroesophageal reflux disease)    History of blood transfusion    Hypertension    Long term current use of aspirin    Long term current use of immunosuppressive drug    a.) mycophenolate + tacrolimus   Myocardial infarction (HCC) 2004   a.) details unclear; stents x 2 (unknown type/location)   Myocardial infarction (HCC) 2005   a.) details unclear; stent x 1 (unknown type/location)   On apixaban therapy    PAF (paroxysmal atrial fibrillation) (HCC)    a.) CHA2DS2-VASc = 5 (age, HTN, vascular disease history/MI, T2DM) as of 06/10/2023; b.) cardiac rate/rhythm maintained intrinsically without pharmacological intervention; chronically anticoagulated using apixaban   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   T2DM (type 2 diabetes mellitus) (HCC)    Unstable angina (HCC)    Vitamin D deficiency       Past Surgical History:  Procedure Laterality Date   CATARACT EXTRACTION Bilateral    CATARACT EXTRACTION W/PHACO Right 02/03/2017   Procedure: CATARACT EXTRACTION PHACO AND INTRAOCULAR LENS PLACEMENT (IOC);  Surgeon: Lockie Mola, MD;  Location: [facility] ORS;  Service: Ophthalmology;  Laterality: Right;  [place] 00:35.8 AP% 12.8 CDE 4.57 Fluid lot # [id number]   COLONOSCOPY WITH PROPOFOL N/A 12/30/2021   Procedure: COLONOSCOPY WITH PROPOFOL;  Surgeon: Wyline Mood, MD;  Location: [facility] ENDOSCOPY;  Service: Gastroenterology;  Laterality: N/A;   CORONARY ANGIOPLASTY WITH STENT PLACEMENT Left 2004   stents x 2   CORONARY ANGIOPLASTY WITH STENT PLACEMENT Left 2005   stent x 1   CORONARY ANGIOPLASTY WITH STENT PLACEMENT Left 08/25/2007   Procedure: CORONARY ANGIOPLASTY WITH STENT PLACEMENT; Location: [facility]; Surgeon: Lennart Pall, MD   CORONARY ARTERY BYPASS GRAFT N/A 06/08/2006   Procedure: CORONARY ARTERY BYPASS GRAFT; Location: [facility]; Surgeon: Marshell Garfinkel, MD   CYSTOSCOPY WITH INSERTION OF UROLIFT N/A 06/13/2023   Procedure: CYSTOSCOPY WITH INSERTION OF UROLIFT;  Surgeon: [name], MD;  Location: [facility] ORS;  Service: Urology;  Laterality: N/A;   ENDARTERECTOMY Left 09/06/2019   Procedure: ENDARTERECTOMY CAROTID;  Surgeon: Annice Needy, MD;  Location: [facility] OR[facility];  Service: Vascular;  Laterality: Left;   HIP  FRACTURE SURGERY     KIDNEY TRANSPLANT Right 05/10/2015   LEFT HEART CATH AND CORONARY ANGIOGRAPHY Left 05/20/2006   Procedure: LEFT HEART CATH AND CORONARY ANGIOGRAPHY; Location: [facility]; Surgeon: Adrian Blackwater, MD   LEFT HEART CATH AND CORS/GRAFTS ANGIOGRAPHY Left 08/10/2007   Procedure: LEFT HEART CATH AND CORS/GRAFTS ANGIOGRAPHY; Location: [facility]; Surgeon: Adrian Blackwater, MD   LEFT HEART CATH AND CORS/GRAFTS ANGIOGRAPHY Left 05/04/2011   Procedure: LEFT HEART CATH AND CORS/GRAFTS ANGIOGRAPHY; Location: [facility]; Surgeon: Adrian Blackwater, MD     Current Outpatient Medications  Medication Sig  Dispense Refill   ascorbic acid (VITAMIN C) 500 MG tablet Take 500 mg by mouth daily.     aspirin EC 81 MG EC tablet Take 1 tablet (81 mg total) by mouth daily at 6 (six) AM.     atorvastatin (LIPITOR) 40 MG tablet Take 40 mg by mouth at bedtime.     CELLCEPT 250 MG capsule Take 500 mg by mouth 2 (two) times daily.     Cholecalciferol (VITAMIN D) 50 MCG (2000 UT) CAPS Take 2,000 Units by mouth daily.     Continuous Blood Gluc Sensor (FREESTYLE LIBRE 2 SENSOR) MISC by Does not apply route.     ELIQUIS 5 MG TABS tablet Take 5 mg by mouth 2 (two) times daily.      finasteride (PROSCAR) 5 MG tablet TAKE 1 TABLET BY MOUTH ONCE DAILY. 90 tablet 0   HUMALOG KWIKPEN 100 UNIT/ML KiwkPen Inject 10-12 Units into the skin 3 (three) times daily. Inject 10 units daily at breakfast, 10 units at lunch and up to 10 units or less at supper per SSI     Insulin Disposable Pump (OMNIPOD DASH 5 PACK PODS) MISC      Insulin Pen Needle 33G X 4 MM MISC To use with insulin pen 4 times per day. E 13.9     LANTUS SOLOSTAR 100 UNIT/ML Solostar Pen Inject 24 Units into the skin at bedtime.      linagliptin (TRADJENTA) 5 MG TABS tablet Take 5 mg by mouth daily.     losartan (COZAAR) 50 MG tablet Take 50 mg by mouth in the morning and at bedtime.     omeprazole (PRILOSEC) 20 MG capsule Take 20 mg by mouth daily.     PROGRAF 1 MG capsule Take 0.5-1 mg by mouth 2 (two) times daily.     tamsulosin (FLOMAX) 0.4 MG CAPS capsule Take 0.4 mg by mouth 2 (two) times daily.      trospium (SANCTURA) 20 MG tablet Take 1 tablet (20 mg total) by mouth at bedtime. 30 tablet 3   No current facility-administered medications for this visit.    Allergies:   Oxybutynin    Social History:   reports that he has quit smoking. He has never used smokeless tobacco. He reports that he does not drink alcohol and does not use drugs.   Family History:  family history includes Drug abuse in his brother and sister.    ROS:     Review of Systems   Constitutional: Negative.   HENT: Negative.    Eyes: Negative.   Respiratory: Negative.    Cardiovascular:  Positive for chest pain.  Gastrointestinal: Negative.   Genitourinary: Negative.   Musculoskeletal: Negative.   Skin: Negative.   Neurological: Negative.   Endo/Heme/Allergies: Negative.   Psychiatric/Behavioral: Negative.    All other systems reviewed and are negative.     All other systems are reviewed and negative.    PHYSICAL EXAM:  VS:  BP 117/68   Pulse 65   Ht 5\' 8"  (1.727 m)   Wt 164 lb (74.4 kg)   SpO2 97%   BMI 24.94 kg/m  , BMI Body mass index is 24.94 kg/m. Last weight:  Wt Readings from Last 3 Encounters:  10/13/23 164 lb (74.4 kg)  06/16/23 160 lb (72.6 kg)  06/13/23 160 lb 0.9 oz (72.6 kg)     Physical Exam Vitals reviewed.  Constitutional:      Appearance: Normal appearance. He is normal weight.  HENT:     Head: Normocephalic.     Nose: Nose normal.     Mouth/Throat:     Mouth: Mucous membranes are moist.  Eyes:     Pupils: Pupils are equal, round, and reactive to light.  Cardiovascular:     Rate and Rhythm: Normal rate and regular rhythm.     Pulses: Normal pulses.     Heart sounds: Normal heart sounds.  Pulmonary:     Effort: Pulmonary effort is normal.  Abdominal:     General: Abdomen is flat. Bowel sounds are normal.  Musculoskeletal:        General: Normal range of motion.     Cervical back: Normal range of motion.  Skin:    General: Skin is warm.  Neurological:     General: No focal deficit present.     Mental Status: He is alert.  Psychiatric:        Mood and Affect: Mood normal.       EKG:   Recent Labs: No results found for requested labs within last 365 days.    Lipid Panel    Component Value Date/Time   CHOL  10/20/2010 0400    94        ATP III CLASSIFICATION:  <200     mg/dL   Desirable  161-096  mg/dL   Borderline High  >=045    mg/dL   High          TRIG 409 (H) 10/23/2010 0600      Other  studies Reviewed: Additional studies/ records that were reviewed today include:  Review of the above records demonstrates:       No data to display            ASSESSMENT AND PLAN:    ICD-10-CM   1. Paroxysmal atrial fibrillation (HCC)  I48.0 PCV ECHOCARDIOGRAM COMPLETE    MYOCARDIAL PERFUSION IMAGING    2. Coronary artery disease involving coronary bypass graft of native heart with other forms of angina pectoris (HCC)  I25.708 PCV ECHOCARDIOGRAM COMPLETE    MYOCARDIAL PERFUSION IMAGING    3. Essential (primary) hypertension  I10 PCV ECHOCARDIOGRAM COMPLETE    MYOCARDIAL PERFUSION IMAGING    4. Carotid stenosis, left  I65.22 PCV ECHOCARDIOGRAM COMPLETE    MYOCARDIAL PERFUSION IMAGING    5. Chest pain due to myocardial ischemia, unspecified ischemic chest pain type  I25.9 PCV ECHOCARDIOGRAM COMPLETE    MYOCARDIAL PERFUSION IMAGING    6. Hypercholesteremia  E78.00 PCV ECHOCARDIOGRAM COMPLETE    MYOCARDIAL PERFUSION IMAGING    7. Heart failure, unspecified HF chronicity, unspecified heart failure type (HCC)  I50.9 PCV ECHOCARDIOGRAM COMPLETE    MYOCARDIAL PERFUSION IMAGING       Problem List Items Addressed This Visit       Cardiovascular and Mediastinum   Essential (primary) hypertension   Relevant Orders   PCV ECHOCARDIOGRAM COMPLETE   MYOCARDIAL PERFUSION IMAGING   Coronary artery disease  Relevant Orders   PCV ECHOCARDIOGRAM COMPLETE   MYOCARDIAL PERFUSION IMAGING   Paroxysmal atrial fibrillation (HCC) - Primary   Relevant Orders   PCV ECHOCARDIOGRAM COMPLETE   MYOCARDIAL PERFUSION IMAGING   Carotid stenosis, left   Relevant Orders   PCV ECHOCARDIOGRAM COMPLETE   MYOCARDIAL PERFUSION IMAGING     Other   Chest pain   Relevant Orders   PCV ECHOCARDIOGRAM COMPLETE   MYOCARDIAL PERFUSION IMAGING   Hypercholesteremia   Relevant Orders   PCV ECHOCARDIOGRAM COMPLETE   MYOCARDIAL PERFUSION IMAGING   Other Visit Diagnoses       Heart failure,  unspecified HF chronicity, unspecified heart failure type (HCC)       Relevant Orders   PCV ECHOCARDIOGRAM COMPLETE   MYOCARDIAL PERFUSION IMAGING          Disposition:   Return in about 4 weeks (around 11/10/2023) for echo, stress test and f/u.    Total time spent: 30 minutes  Signed,  Adrian Blackwater, MD  10/13/2023 10:13 AM    [facility]

## 2023-10-19 ENCOUNTER — Ambulatory Visit (INDEPENDENT_AMBULATORY_CARE_PROVIDER_SITE_OTHER)

## 2023-10-19 DIAGNOSIS — I25708 Atherosclerosis of coronary artery bypass graft(s), unspecified, with other forms of angina pectoris: Secondary | ICD-10-CM

## 2023-10-19 DIAGNOSIS — I361 Nonrheumatic tricuspid (valve) insufficiency: Secondary | ICD-10-CM | POA: Diagnosis not present

## 2023-10-19 DIAGNOSIS — I509 Heart failure, unspecified: Secondary | ICD-10-CM

## 2023-10-19 DIAGNOSIS — I480 Paroxysmal atrial fibrillation: Secondary | ICD-10-CM

## 2023-10-19 DIAGNOSIS — I259 Chronic ischemic heart disease, unspecified: Secondary | ICD-10-CM

## 2023-10-19 DIAGNOSIS — I371 Nonrheumatic pulmonary valve insufficiency: Secondary | ICD-10-CM | POA: Diagnosis not present

## 2023-10-19 DIAGNOSIS — I6522 Occlusion and stenosis of left carotid artery: Secondary | ICD-10-CM

## 2023-10-19 DIAGNOSIS — I340 Nonrheumatic mitral (valve) insufficiency: Secondary | ICD-10-CM | POA: Diagnosis not present

## 2023-10-19 DIAGNOSIS — I10 Essential (primary) hypertension: Secondary | ICD-10-CM

## 2023-10-19 DIAGNOSIS — E7800 Pure hypercholesterolemia, unspecified: Secondary | ICD-10-CM

## 2023-10-24 ENCOUNTER — Encounter

## 2023-10-26 ENCOUNTER — Encounter: Payer: Self-pay | Admitting: Cardiovascular Disease

## 2023-10-28 ENCOUNTER — Ambulatory Visit: Admitting: Cardiovascular Disease

## 2023-10-31 ENCOUNTER — Ambulatory Visit (INDEPENDENT_AMBULATORY_CARE_PROVIDER_SITE_OTHER)

## 2023-10-31 DIAGNOSIS — I6522 Occlusion and stenosis of left carotid artery: Secondary | ICD-10-CM

## 2023-10-31 DIAGNOSIS — I509 Heart failure, unspecified: Secondary | ICD-10-CM

## 2023-10-31 DIAGNOSIS — I25708 Atherosclerosis of coronary artery bypass graft(s), unspecified, with other forms of angina pectoris: Secondary | ICD-10-CM

## 2023-10-31 DIAGNOSIS — I259 Chronic ischemic heart disease, unspecified: Secondary | ICD-10-CM | POA: Diagnosis not present

## 2023-10-31 DIAGNOSIS — I480 Paroxysmal atrial fibrillation: Secondary | ICD-10-CM

## 2023-10-31 DIAGNOSIS — I10 Essential (primary) hypertension: Secondary | ICD-10-CM

## 2023-10-31 DIAGNOSIS — E7800 Pure hypercholesterolemia, unspecified: Secondary | ICD-10-CM

## 2023-11-03 ENCOUNTER — Ambulatory Visit: Payer: Self-pay | Admitting: Cardiovascular Disease

## 2023-11-03 ENCOUNTER — Ambulatory Visit: Admitting: Cardiovascular Disease

## 2023-11-03 ENCOUNTER — Encounter: Payer: Self-pay | Admitting: Cardiovascular Disease

## 2023-11-03 VITALS — BP 148/89 | HR 74 | Ht 68.0 in | Wt 171.0 lb

## 2023-11-03 DIAGNOSIS — I25708 Atherosclerosis of coronary artery bypass graft(s), unspecified, with other forms of angina pectoris: Secondary | ICD-10-CM | POA: Diagnosis not present

## 2023-11-03 DIAGNOSIS — R0789 Other chest pain: Secondary | ICD-10-CM | POA: Diagnosis not present

## 2023-11-03 DIAGNOSIS — I480 Paroxysmal atrial fibrillation: Secondary | ICD-10-CM

## 2023-11-03 DIAGNOSIS — I509 Heart failure, unspecified: Secondary | ICD-10-CM | POA: Insufficient documentation

## 2023-11-03 DIAGNOSIS — E119 Type 2 diabetes mellitus without complications: Secondary | ICD-10-CM | POA: Diagnosis not present

## 2023-11-03 DIAGNOSIS — I259 Chronic ischemic heart disease, unspecified: Secondary | ICD-10-CM

## 2023-11-03 DIAGNOSIS — R9439 Abnormal result of other cardiovascular function study: Secondary | ICD-10-CM

## 2023-11-03 HISTORY — DX: Heart failure, unspecified: I50.9

## 2023-11-03 MED ORDER — AMIODARONE HCL 400 MG PO TABS: 400.0000 mg | ORAL_TABLET | Freq: Every day | ORAL | 0 refills | Status: DC

## 2023-11-03 MED ORDER — SODIUM CHLORIDE 0.9% FLUSH: 3.0000 mL | Freq: Two times a day (BID) | INTRAVENOUS | Status: AC

## 2023-11-03 NOTE — Progress Notes (Signed)
 Cardiology Office Note   Date:  11/03/2023   ID:  Leonard, [name] 1956/04/19, MRN [id number]  PCP:  Leonard Sander, MD  Cardiologist:  Leonard Blackwater, MD      History of Present Illness: [name] is a 68 y.o. male who presents for  Chief Complaint  Patient presents with   Follow-up    ECHO/NST results    Has occasional chest pain on left side, intermittanly while laying in bed.      Past Medical History:  Diagnosis Date   Aortic atherosclerosis (HCC)    Arthritis    BPH (benign prostatic hyperplasia)    Carotid arterial disease (HCC)    a.) s/p LEFT CEA on 09/06/2019   Coronary artery disease    a.) MI with stents x 2 in 2004; b.) MI with stent x 1 2005; c.) s/p CABG x 2 06/02/2006 (LIMA-LAD, SVG-PDA); d.) LHC/PCI 08/25/2007: 80% oD1 (2.5 x 12 mm Xience V DES)   DDD (degenerative disc disease), lumbar    Dyspnea    ESRD (end stage renal disease) (HCC)    a.) s/p RIGHT renal 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/2015   GERD (gastroesophageal reflux disease)    History of blood transfusion    Hypertension    Long term current use of aspirin    Long term current use of immunosuppressive drug    a.) mycophenolate + tacrolimus   Myocardial infarction (HCC) 2004   a.) details unclear; stents x 2 (unknown type/location)   Myocardial infarction (HCC) 2005   a.) details unclear; stent x 1 (unknown type/location)   On apixaban therapy    PAF (paroxysmal atrial fibrillation) (HCC)    a.) CHA2DS2-VASc = 5 (age, HTN, vascular disease history/MI, T2DM) as of 06/10/2023; b.) cardiac rate/rhythm maintained intrinsically without pharmacological intervention; chronically anticoagulated using apixaban   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   T2DM (type 2 diabetes mellitus) (HCC)    Unstable angina (HCC)    Vitamin D deficiency      Past Surgical History:  Procedure Laterality Date   CATARACT EXTRACTION Bilateral    CATARACT EXTRACTION W/PHACO Right 02/03/2017    Procedure: CATARACT EXTRACTION PHACO AND INTRAOCULAR LENS PLACEMENT (IOC);  Surgeon: Lockie Mola, MD;  Location: [facility] ORS;  Service: Ophthalmology;  Laterality: Right;  [place] 00:35.8 AP% 12.8 CDE 4.57 Fluid lot # [id number]   COLONOSCOPY WITH PROPOFOL N/A 12/30/2021   Procedure: COLONOSCOPY WITH PROPOFOL;  Surgeon: Wyline Mood, MD;  Location: [facility] ENDOSCOPY;  Service: Gastroenterology;  Laterality: N/A;   CORONARY ANGIOPLASTY WITH STENT PLACEMENT Left 2004   stents x 2   CORONARY ANGIOPLASTY WITH STENT PLACEMENT Left 2005   stent x 1   CORONARY ANGIOPLASTY WITH STENT PLACEMENT Left 08/25/2007   Procedure: CORONARY ANGIOPLASTY WITH STENT PLACEMENT; Location: [facility]; Surgeon: Lennart Pall, MD   CORONARY ARTERY BYPASS GRAFT N/A 06/08/2006   Procedure: CORONARY ARTERY BYPASS GRAFT; Location: [facility]; Surgeon: Marshell Garfinkel, MD   CYSTOSCOPY WITH INSERTION OF UROLIFT N/A 06/13/2023   Procedure: CYSTOSCOPY WITH INSERTION OF UROLIFT;  Surgeon: [name], MD;  Location: [facility] ORS;  Service: Urology;  Laterality: N/A;   ENDARTERECTOMY Left 09/06/2019   Procedure: ENDARTERECTOMY CAROTID;  Surgeon: Annice Needy, MD;  Location: [facility] ORS;  Service: Vascular;  Laterality: Left;   HIP FRACTURE SURGERY     KIDNEY TRANSPLANT Right 05/10/2015   LEFT HEART CATH AND CORONARY ANGIOGRAPHY Left 05/20/2006   Procedure: LEFT HEART CATH AND CORONARY ANGIOGRAPHY; Location: [facility]; Surgeon: Wynelle Link  Welton Flakes, MD   LEFT HEART CATH AND CORS/GRAFTS ANGIOGRAPHY Left 08/10/2007   Procedure: LEFT HEART CATH AND CORS/GRAFTS ANGIOGRAPHY; Location: [facility]; Surgeon: Leonard Blackwater, MD   LEFT HEART CATH AND CORS/GRAFTS ANGIOGRAPHY Left 05/04/2011   Procedure: LEFT HEART CATH AND CORS/GRAFTS ANGIOGRAPHY; Location: [facility]; Surgeon: Leonard Blackwater, MD     Current Outpatient Medications  Medication Sig Dispense Refill   amiodarone (PACERONE) 400 MG tablet Take 1 tablet (400 mg total) by mouth daily. 60 tablet 0   ascorbic acid (VITAMIN  C) 500 MG tablet Take 500 mg by mouth daily.     aspirin EC 81 MG EC tablet Take 1 tablet (81 mg total) by mouth daily at 6 (six) AM.     atorvastatin (LIPITOR) 40 MG tablet Take 40 mg by mouth at bedtime.     CELLCEPT 250 MG capsule Take 500 mg by mouth 2 (two) times daily.     Cholecalciferol (VITAMIN D) 50 MCG (2000 UT) CAPS Take 2,000 Units by mouth daily.     Continuous Blood Gluc Sensor (FREESTYLE LIBRE 2 SENSOR) MISC by Does not apply route.     ELIQUIS 5 MG TABS tablet Take 5 mg by mouth 2 (two) times daily.      finasteride (PROSCAR) 5 MG tablet TAKE 1 TABLET BY MOUTH ONCE DAILY. 90 tablet 0   HUMALOG KWIKPEN 100 UNIT/ML KiwkPen Inject 10-12 Units into the skin 3 (three) times daily. Inject 10 units daily at breakfast, 10 units at lunch and up to 10 units or less at supper per SSI     Insulin Disposable Pump (OMNIPOD DASH 5 PACK PODS) MISC      Insulin Pen Needle 33G X 4 MM MISC To use with insulin pen 4 times per day. E 13.9     LANTUS SOLOSTAR 100 UNIT/ML Solostar Pen Inject 24 Units into the skin at bedtime.      linagliptin (TRADJENTA) 5 MG TABS tablet Take 5 mg by mouth daily.     losartan (COZAAR) 50 MG tablet Take 50 mg by mouth in the morning and at bedtime.     omeprazole (PRILOSEC) 20 MG capsule Take 20 mg by mouth daily.     PROGRAF 1 MG capsule Take 0.5-1 mg by mouth 2 (two) times daily.     tamsulosin (FLOMAX) 0.4 MG CAPS capsule Take 0.4 mg by mouth 2 (two) times daily.      trospium (SANCTURA) 20 MG tablet Take 1 tablet (20 mg total) by mouth at bedtime. 30 tablet 3   No current facility-administered medications for this visit.   Facility-Administered Medications Ordered in Other Visits  Medication Dose Route Frequency Provider Last Rate Last Admin   sodium chloride flush (NS) 0.9 % injection 3 mL  3 mL Intravenous Q12H Laurier Nancy, MD        Allergies:   Oxybutynin    Social History:   reports that he has quit smoking. He has never used smokeless tobacco. He  reports that he does not drink alcohol and does not use drugs.   Family History:  family history includes Drug abuse in his brother and sister.    ROS:     Review of Systems  Constitutional: Negative.   HENT: Negative.    Eyes: Negative.   Respiratory: Negative.    Gastrointestinal: Negative.   Genitourinary: Negative.   Musculoskeletal: Negative.   Skin: Negative.   Neurological: Negative.   Endo/Heme/Allergies: Negative.   Psychiatric/Behavioral: Negative.    All other  systems reviewed and are negative.     All other systems are reviewed and negative.    PHYSICAL EXAM: VS:  BP (!) 148/89   Pulse 74   Ht 5\' 8"  (1.727 m)   Wt 171 lb (77.6 kg)   SpO2 97%   BMI 26.00 kg/m  , BMI Body mass index is 26 kg/m. Last weight:  Wt Readings from Last 3 Encounters:  11/03/23 171 lb (77.6 kg)  10/13/23 164 lb (74.4 kg)  06/16/23 160 lb (72.6 kg)     Physical Exam Vitals reviewed.  Constitutional:      Appearance: Normal appearance. He is normal weight.  HENT:     Head: Normocephalic.     Nose: Nose normal.     Mouth/Throat:     Mouth: Mucous membranes are moist.  Eyes:     Pupils: Pupils are equal, round, and reactive to light.  Cardiovascular:     Rate and Rhythm: Normal rate and regular rhythm.     Pulses: Normal pulses.     Heart sounds: Normal heart sounds.  Pulmonary:     Effort: Pulmonary effort is normal.  Abdominal:     General: Abdomen is flat. Bowel sounds are normal.  Musculoskeletal:        General: Normal range of motion.     Cervical back: Normal range of motion.  Skin:    General: Skin is warm.  Neurological:     General: No focal deficit present.     Mental Status: He is alert.  Psychiatric:        Mood and Affect: Mood normal.       EKG:   Recent Labs: No results found for requested labs within last 365 days.    Lipid Panel    Component Value Date/Time   CHOL  10/20/2010 0400    94        ATP III CLASSIFICATION:  <200      mg/dL   Desirable  027-253  mg/dL   Borderline High  >=664    mg/dL   High          TRIG 403 (H) 10/23/2010 0600      Other studies Reviewed: Additional studies/ records that were reviewed today include:  Review of the above records demonstrates:       No data to display            ASSESSMENT AND PLAN:    ICD-10-CM   1. Other chest pain  R07.89 Procedural/ Surgical Case Request: LEFT HEART CATH AND CORONARY ANGIOGRAPHY with possible intervention    Comprehensive metabolic panel    CBC with Differential/Platelet    amiodarone (PACERONE) 400 MG tablet   stress test shows inferolateral fixeded defect with LVEF 43%, and in atrial fibrillation. Has chest pain thus will do cath as has chest pains.stop elliquis 3 da    2. Heart failure, unspecified HF chronicity, unspecified heart failure type (HCC)  I50.9 Procedural/ Surgical Case Request: LEFT HEART CATH AND CORONARY ANGIOGRAPHY with possible intervention    Comprehensive metabolic panel    CBC with Differential/Platelet    amiodarone (PACERONE) 400 MG tablet    3. Chest pain due to myocardial ischemia, unspecified ischemic chest pain type  I25.9 Procedural/ Surgical Case Request: LEFT HEART CATH AND CORONARY ANGIOGRAPHY with possible intervention    Comprehensive metabolic panel    CBC with Differential/Platelet    amiodarone (PACERONE) 400 MG tablet    4. Coronary artery disease involving coronary  bypass graft of native heart with other forms of angina pectoris (HCC)  I25.708 Procedural/ Surgical Case Request: LEFT HEART CATH AND CORONARY ANGIOGRAPHY with possible intervention    Comprehensive metabolic panel    CBC with Differential/Platelet    amiodarone (PACERONE) 400 MG tablet    5. Paroxysmal atrial fibrillation (HCC)  I48.0 Procedural/ Surgical Case Request: LEFT HEART CATH AND CORONARY ANGIOGRAPHY with possible intervention    Comprehensive metabolic panel    CBC with Differential/Platelet    amiodarone (PACERONE)  400 MG tablet       Problem List Items Addressed This Visit       Cardiovascular and Mediastinum   Coronary artery disease   Relevant Medications   amiodarone (PACERONE) 400 MG tablet   Other Relevant Orders   Procedural/ Surgical Case Request: LEFT HEART CATH AND CORONARY ANGIOGRAPHY with possible intervention (Completed)   Comprehensive metabolic panel   CBC with Differential/Platelet   Paroxysmal atrial fibrillation (HCC)   Relevant Medications   amiodarone (PACERONE) 400 MG tablet   Other Relevant Orders   Procedural/ Surgical Case Request: LEFT HEART CATH AND CORONARY ANGIOGRAPHY with possible intervention (Completed)   Comprehensive metabolic panel   CBC with Differential/Platelet   Heart failure (HCC)   Relevant Medications   amiodarone (PACERONE) 400 MG tablet   Other Relevant Orders   Procedural/ Surgical Case Request: LEFT HEART CATH AND CORONARY ANGIOGRAPHY with possible intervention (Completed)   Comprehensive metabolic panel   CBC with Differential/Platelet     Other   Chest pain - Primary   Relevant Medications   amiodarone (PACERONE) 400 MG tablet   Other Relevant Orders   Procedural/ Surgical Case Request: LEFT HEART CATH AND CORONARY ANGIOGRAPHY with possible intervention (Completed)   Comprehensive metabolic panel   CBC with Differential/Platelet   Procedural/ Surgical Case Request: LEFT HEART CATH AND CORONARY ANGIOGRAPHY with possible intervention (Completed)   Comprehensive metabolic panel   CBC with Differential/Platelet       Disposition:   Return in about 2 weeks (around 11/17/2023) for set up left hear cath and f/u.    Total time spent: 30 minutes  Signed,  Leonard Blackwater, MD  11/03/2023 10:18 AM    [facility]

## 2023-11-04 ENCOUNTER — Other Ambulatory Visit: Payer: Self-pay

## 2023-11-04 LAB — COMPREHENSIVE METABOLIC PANEL WITH GFR
ALT: 20 IU/L (ref 0–44)
AST: 22 IU/L (ref 0–40)
Albumin: 4 g/dL (ref 3.9–4.9)
Alkaline Phosphatase: 56 IU/L (ref 44–121)
BUN/Creatinine Ratio: 22 (ref 10–24)
BUN: 28 mg/dL — ABNORMAL HIGH (ref 8–27)
Bilirubin Total: 0.6 mg/dL (ref 0.0–1.2)
CO2: 20 mmol/L (ref 20–29)
Calcium: 9 mg/dL (ref 8.6–10.2)
Chloride: 102 mmol/L (ref 96–106)
Creatinine, Ser: 1.26 mg/dL (ref 0.76–1.27)
Globulin, Total: 2.6 g/dL (ref 1.5–4.5)
Glucose: 218 mg/dL — ABNORMAL HIGH (ref 70–99)
Potassium: 5 mmol/L (ref 3.5–5.2)
Sodium: 138 mmol/L (ref 134–144)
Total Protein: 6.6 g/dL (ref 6.0–8.5)
eGFR: 62 mL/min/{1.73_m2} (ref >59)

## 2023-11-04 LAB — CBC WITH DIFFERENTIAL/PLATELET
Basophils Absolute: 0 10*3/uL (ref 0.0–0.2)
Basos: 1 %
EOS (ABSOLUTE): 0.1 10*3/uL (ref 0.0–0.4)
Eos: 2 %
Hematocrit: 47 % (ref 37.5–51.0)
Hemoglobin: 14.9 g/dL (ref 13.0–17.7)
Immature Grans (Abs): 0 10*3/uL (ref 0.0–0.1)
Immature Granulocytes: 0 %
Lymphocytes Absolute: 1.2 10*3/uL (ref 0.7–3.1)
Lymphs: 18 %
MCH: 29.9 pg (ref 26.6–33.0)
MCHC: 31.7 g/dL (ref 31.5–35.7)
MCV: 94 fL (ref 79–97)
Monocytes Absolute: 0.7 10*3/uL (ref 0.1–0.9)
Monocytes: 10 %
Neutrophils Absolute: 4.5 10*3/uL (ref 1.4–7.0)
Neutrophils: 69 %
Platelets: 157 10*3/uL (ref 150–450)
RBC: 4.99 x10E6/uL (ref 4.14–5.80)
RDW: 12.7 % (ref 11.6–15.4)
WBC: 6.5 10*3/uL (ref 3.4–10.8)

## 2023-11-04 MED ORDER — POTASSIUM CHLORIDE CRYS ER 20 MEQ PO TBCR: 40.0000 meq | EXTENDED_RELEASE_TABLET | Freq: Once | ORAL | 0 refills | Status: DC

## 2023-11-08 ENCOUNTER — Encounter: Payer: Self-pay | Admitting: Cardiovascular Disease

## 2023-11-08 ENCOUNTER — Encounter
Admission: RE | Disposition: A | Discharge status: Home / Self Care | Payer: Self-pay | Source: Home / Self Care | Attending: Cardiovascular Disease

## 2023-11-08 ENCOUNTER — Ambulatory Visit
Admission: RE | Admit: 2023-11-08 | Discharge: 2023-11-08 | Disposition: A | Discharge status: Home / Self Care | Attending: Cardiovascular Disease | Admitting: Cardiovascular Disease

## 2023-11-08 ENCOUNTER — Other Ambulatory Visit: Payer: Self-pay

## 2023-11-08 DIAGNOSIS — I259 Chronic ischemic heart disease, unspecified: Secondary | ICD-10-CM

## 2023-11-08 DIAGNOSIS — R9439 Abnormal result of other cardiovascular function study: Secondary | ICD-10-CM

## 2023-11-08 DIAGNOSIS — I480 Paroxysmal atrial fibrillation: Secondary | ICD-10-CM | POA: Diagnosis not present

## 2023-11-08 DIAGNOSIS — I2582 Chronic total occlusion of coronary artery: Secondary | ICD-10-CM | POA: Insufficient documentation

## 2023-11-08 DIAGNOSIS — I25708 Atherosclerosis of coronary artery bypass graft(s), unspecified, with other forms of angina pectoris: Secondary | ICD-10-CM | POA: Insufficient documentation

## 2023-11-08 DIAGNOSIS — I4891 Unspecified atrial fibrillation: Secondary | ICD-10-CM | POA: Diagnosis not present

## 2023-11-08 DIAGNOSIS — I509 Heart failure, unspecified: Secondary | ICD-10-CM

## 2023-11-08 DIAGNOSIS — I2510 Atherosclerotic heart disease of native coronary artery without angina pectoris: Secondary | ICD-10-CM | POA: Insufficient documentation

## 2023-11-08 DIAGNOSIS — I200 Unstable angina: Secondary | ICD-10-CM | POA: Diagnosis not present

## 2023-11-08 DIAGNOSIS — R0789 Other chest pain: Secondary | ICD-10-CM

## 2023-11-08 DIAGNOSIS — R079 Chest pain, unspecified: Secondary | ICD-10-CM | POA: Insufficient documentation

## 2023-11-08 DIAGNOSIS — I5022 Chronic systolic (congestive) heart failure: Secondary | ICD-10-CM | POA: Insufficient documentation

## 2023-11-08 HISTORY — PX: LEFT HEART CATH AND CORONARY ANGIOGRAPHY: CATH118249

## 2023-11-08 LAB — GLUCOSE, CAPILLARY: Glucose-Capillary: 142 mg/dL — ABNORMAL HIGH (ref 70–99)

## 2023-11-08 SURGERY — LEFT HEART CATH AND CORONARY ANGIOGRAPHY
Anesthesia: Moderate Sedation | Laterality: Left

## 2023-11-08 MED ORDER — IOHEXOL 300 MG/ML  SOLN
INTRAMUSCULAR | Status: DC | PRN
  Administered 2023-11-08: 80 mL

## 2023-11-08 MED ORDER — SODIUM CHLORIDE 0.9 % IV SOLN: 250.0000 mL | INTRAVENOUS | Status: DC | PRN

## 2023-11-08 MED ORDER — SODIUM CHLORIDE 0.9 % IV BOLUS
INTRAVENOUS | Status: DC | PRN
  Administered 2023-11-08: 500 mL via INTRAVENOUS

## 2023-11-08 MED ORDER — SODIUM CHLORIDE 0.9 % WEIGHT BASED INFUSION: 1.0000 mL/kg/h | INTRAVENOUS | Status: DC

## 2023-11-08 MED ORDER — MIDAZOLAM HCL 2 MG/2ML IJ SOLN
INTRAMUSCULAR | Status: AC
  Filled 2023-11-08: qty 2

## 2023-11-08 MED ORDER — SODIUM CHLORIDE 0.9 % IV SOLN: INTRAVENOUS | Status: DC | PRN

## 2023-11-08 MED ORDER — HYDRALAZINE HCL 20 MG/ML IJ SOLN: 10.0000 mg | INTRAMUSCULAR | Status: DC | PRN

## 2023-11-08 MED ORDER — ASPIRIN 81 MG PO CHEW
81.0000 mg | CHEWABLE_TABLET | ORAL | Status: AC
Start: 2023-11-09 — End: 2023-11-08
  Administered 2023-11-08: 81 mg via ORAL

## 2023-11-08 MED ORDER — ASPIRIN 81 MG PO CHEW
CHEWABLE_TABLET | ORAL | Status: AC
  Filled 2023-11-08: qty 1

## 2023-11-08 MED ORDER — HEPARIN (PORCINE) IN NACL 2000-0.9 UNIT/L-% IV SOLN
INTRAVENOUS | Status: DC | PRN
  Administered 2023-11-08: 1000 mL

## 2023-11-08 MED ORDER — MIDAZOLAM HCL 2 MG/2ML IJ SOLN
INTRAMUSCULAR | Status: DC | PRN
  Administered 2023-11-08: 1 mg via INTRAVENOUS

## 2023-11-08 MED ORDER — LABETALOL HCL 5 MG/ML IV SOLN: 10.0000 mg | INTRAVENOUS | Status: DC | PRN

## 2023-11-08 MED ORDER — FENTANYL CITRATE (PF) 100 MCG/2ML IJ SOLN
INTRAMUSCULAR | Status: AC
  Filled 2023-11-08: qty 2

## 2023-11-08 MED ORDER — SODIUM CHLORIDE 0.9% FLUSH: 3.0000 mL | Freq: Two times a day (BID) | INTRAVENOUS | Status: DC

## 2023-11-08 MED ORDER — SODIUM CHLORIDE 0.9% FLUSH: 3.0000 mL | INTRAVENOUS | Status: DC | PRN

## 2023-11-08 MED ORDER — ONDANSETRON HCL 4 MG/2ML IJ SOLN: 4.0000 mg | Freq: Four times a day (QID) | INTRAMUSCULAR | Status: DC | PRN

## 2023-11-08 MED ORDER — SODIUM CHLORIDE 0.9 % WEIGHT BASED INFUSION
3.0000 mL/kg/h | INTRAVENOUS | Status: DC
  Administered 2023-11-08: 3 mL/kg/h via INTRAVENOUS

## 2023-11-08 MED ORDER — ACETAMINOPHEN 325 MG PO TABS: 650.0000 mg | ORAL_TABLET | ORAL | Status: DC | PRN

## 2023-11-08 MED ORDER — FENTANYL CITRATE (PF) 100 MCG/2ML IJ SOLN
INTRAMUSCULAR | Status: DC | PRN
  Administered 2023-11-08: 50 ug via INTRAVENOUS

## 2023-11-08 MED ORDER — HEPARIN (PORCINE) IN NACL 1000-0.9 UT/500ML-% IV SOLN
INTRAVENOUS | Status: AC
  Filled 2023-11-08: qty 1000

## 2023-11-08 SURGICAL SUPPLY — 17 items
CATH ANGIO 5F JB2 100CM (CATHETERS) IMPLANT
CATH INFINITI 5 FR IM (CATHETERS) IMPLANT
CATH INFINITI 5 FR MPA2 (CATHETERS) IMPLANT
CATH INFINITI 5 FR RCB (CATHETERS) IMPLANT
CATH INFINITI 5FR JL5 (CATHETERS) IMPLANT
CATH INFINITI 5FR MULTPACK ANG (CATHETERS) IMPLANT
DEVICE CLOSURE MYNXGRIP 5F (Vascular Products) IMPLANT
DRAPE BRACHIAL (DRAPES) IMPLANT
NDL PERC 18GX7CM (NEEDLE) IMPLANT
NEEDLE PERC 18GX7CM (NEEDLE) ×1 IMPLANT
PACK CARDIAC CATH (CUSTOM PROCEDURE TRAY) ×1 IMPLANT
PROTECTION STATION PRESSURIZED (MISCELLANEOUS) ×1 IMPLANT
SET ATX-X65L (MISCELLANEOUS) IMPLANT
SHEATH AVANTI 5FR X 11CM (SHEATH) IMPLANT
STATION PROTECTION PRESSURIZED (MISCELLANEOUS) IMPLANT
WIRE EMERALD 3MM-J .035X260CM (WIRE) IMPLANT
WIRE GUIDERIGHT .035X150 (WIRE) IMPLANT

## 2023-11-09 DIAGNOSIS — Z940 Kidney transplant status: Principal | ICD-10-CM

## 2023-11-09 MED ORDER — LINAGLIPTIN 5 MG TABLET
ORAL_TABLET | Freq: Every day | ORAL | 3 refills | 90.00 days | Status: CP
Start: 2023-11-09 — End: 2024-11-08

## 2023-11-11 ENCOUNTER — Encounter: Payer: Self-pay | Admitting: Cardiovascular Disease

## 2023-11-11 ENCOUNTER — Ambulatory Visit (INDEPENDENT_AMBULATORY_CARE_PROVIDER_SITE_OTHER): Admitting: Cardiovascular Disease

## 2023-11-11 VITALS — BP 138/72 | HR 54 | Ht 68.0 in | Wt 172.0 lb

## 2023-11-11 DIAGNOSIS — Z8679 Personal history of other diseases of the circulatory system: Secondary | ICD-10-CM | POA: Diagnosis not present

## 2023-11-11 DIAGNOSIS — Z0130 Encounter for examination of blood pressure without abnormal findings: Secondary | ICD-10-CM

## 2023-11-11 DIAGNOSIS — I509 Heart failure, unspecified: Secondary | ICD-10-CM

## 2023-11-11 DIAGNOSIS — R001 Bradycardia, unspecified: Secondary | ICD-10-CM | POA: Diagnosis not present

## 2023-11-11 DIAGNOSIS — I259 Chronic ischemic heart disease, unspecified: Secondary | ICD-10-CM

## 2023-11-11 DIAGNOSIS — I25708 Atherosclerosis of coronary artery bypass graft(s), unspecified, with other forms of angina pectoris: Secondary | ICD-10-CM | POA: Diagnosis not present

## 2023-11-11 MED ORDER — NITROGLYCERIN 0.4 MG SL SUBL: 0.4000 mg | SUBLINGUAL_TABLET | SUBLINGUAL | 3 refills | Status: AC | PRN

## 2023-11-11 MED ORDER — LOSARTAN POTASSIUM 100 MG PO TABS: 100.0000 mg | ORAL_TABLET | Freq: Every day | ORAL | 11 refills | Status: AC

## 2023-11-11 NOTE — Progress Notes (Signed)
 Cardiology Office Note   Date:  11/11/2023   ID:  AVERIE MEINER, DOB 12/18/1955, MRN [id number]  PCP:  Patient, No Pcp Per  Cardiologist:  Adrian Blackwater, MD      History of Present Illness: [name] is a 68 y.o. male who presents for  Chief Complaint  Patient presents with   Follow-up    Cath follow up     Had cardiac cath this week, doing well no groin issue from it. Still has occasional chest pains.      Past Medical History:  Diagnosis Date   Aortic atherosclerosis (HCC)    Arthritis    BPH (benign prostatic hyperplasia)    Carotid arterial disease (HCC)    a.) s/p LEFT CEA on 09/06/2019   Coronary artery disease    a.) MI with stents x 2 in 2004; b.) MI with stent x 1 2005; c.) s/p CABG x 2 06/02/2006 (LIMA-LAD, SVG-PDA); d.) LHC/PCI 08/25/2007: 80% oD1 (2.5 x 12 mm Xience V DES)   DDD (degenerative disc disease), lumbar    Dyspnea    ESRD (end stage renal disease) (HCC)    a.) s/p RIGHT renal 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/2015   GERD (gastroesophageal reflux disease)    History of blood transfusion    Hypertension    Long term current use of aspirin    Long term current use of immunosuppressive drug    a.) mycophenolate + tacrolimus   Myocardial infarction (HCC) 2004   a.) details unclear; stents x 2 (unknown type/location)   Myocardial infarction (HCC) 2005   a.) details unclear; stent x 1 (unknown type/location)   On apixaban therapy    PAF (paroxysmal atrial fibrillation) (HCC)    a.) CHA2DS2-VASc = 5 (age, HTN, vascular disease history/MI, T2DM) as of 06/10/2023; b.) cardiac rate/rhythm maintained intrinsically without pharmacological intervention; chronically anticoagulated using apixaban   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   T2DM (type 2 diabetes mellitus) (HCC)    Unstable angina (HCC)    Vitamin D deficiency      Past Surgical History:  Procedure Laterality Date   CATARACT EXTRACTION Bilateral    CATARACT EXTRACTION W/PHACO Right  02/03/2017   Procedure: CATARACT EXTRACTION PHACO AND INTRAOCULAR LENS PLACEMENT (IOC);  Surgeon: Lockie Mola, MD;  Location: [facility] ORS;  Service: Ophthalmology;  Laterality: Right;  [place] 00:35.8 AP% 12.8 CDE 4.57 Fluid lot # [id number]   COLONOSCOPY WITH PROPOFOL N/A 12/30/2021   Procedure: COLONOSCOPY WITH PROPOFOL;  Surgeon: Wyline Mood, MD;  Location: [facility] ENDOSCOPY;  Service: Gastroenterology;  Laterality: N/A;   CORONARY ANGIOPLASTY WITH STENT PLACEMENT Left 2004   stents x 2   CORONARY ANGIOPLASTY WITH STENT PLACEMENT Left 2005   stent x 1   CORONARY ANGIOPLASTY WITH STENT PLACEMENT Left 08/25/2007   Procedure: CORONARY ANGIOPLASTY WITH STENT PLACEMENT; Location: [facility]; Surgeon: Lennart Pall, MD   CORONARY ARTERY BYPASS GRAFT N/A 06/08/2006   Procedure: CORONARY ARTERY BYPASS GRAFT; Location: [facility]; Surgeon: Marshell Garfinkel, MD   CYSTOSCOPY WITH INSERTION OF UROLIFT N/A 06/13/2023   Procedure: CYSTOSCOPY WITH INSERTION OF UROLIFT;  Surgeon: [name], MD;  Location: [facility] OR[facility];  Service: Urology;  Laterality: N/A;   ENDARTERECTOMY Left 09/06/2019   Procedure: ENDARTERECTOMY CAROTID;  Surgeon: Annice Needy, MD;  Location: [facility] ORS;  Service: Vascular;  Laterality: Left;   HIP FRACTURE SURGERY     KIDNEY TRANSPLANT Right 05/10/2015   LEFT HEART CATH AND CORONARY ANGIOGRAPHY Left 05/20/2006   Procedure: LEFT HEART CATH  AND CORONARY ANGIOGRAPHY; Location: [facility]; Surgeon: Adrian Blackwater, MD   LEFT HEART CATH AND CORONARY ANGIOGRAPHY Left 11/08/2023   Procedure: LEFT HEART CATH AND CORONARY ANGIOGRAPHY with possible intervention;  Surgeon: Laurier Nancy, MD;  Location: [facility] INVASIVE CV LAB;  Service: Cardiovascular;  Laterality: Left;   LEFT HEART CATH AND CORS/GRAFTS ANGIOGRAPHY Left 08/10/2007   Procedure: LEFT HEART CATH AND CORS/GRAFTS ANGIOGRAPHY; Location: [facility]; Surgeon: Adrian Blackwater, MD   LEFT HEART CATH AND CORS/GRAFTS ANGIOGRAPHY Left 05/04/2011   Procedure: LEFT HEART CATH  AND CORS/GRAFTS ANGIOGRAPHY; Location: [facility]; Surgeon: Adrian Blackwater, MD     Current Outpatient Medications  Medication Sig Dispense Refill   losartan (COZAAR) 100 MG tablet Take 1 tablet (100 mg total) by mouth daily. 30 tablet 11   nitroGLYCERIN (NITROSTAT) 0.4 MG SL tablet Place 1 tablet (0.4 mg total) under the tongue every 5 (five) minutes as needed for chest pain. 100 tablet 3   amiodarone (PACERONE) 400 MG tablet Take 1 tablet (400 mg total) by mouth daily. 60 tablet 0   ascorbic acid (VITAMIN C) 500 MG tablet Take 500 mg by mouth daily.     aspirin EC 81 MG EC tablet Take 1 tablet (81 mg total) by mouth daily at 6 (six) AM.     atorvastatin (LIPITOR) 40 MG tablet Take 40 mg by mouth at bedtime.     CELLCEPT 250 MG capsule Take 500 mg by mouth 2 (two) times daily.     Cholecalciferol (VITAMIN D) 50 MCG (2000 UT) CAPS Take 2,000 Units by mouth daily.     Continuous Blood Gluc Sensor (FREESTYLE LIBRE 2 SENSOR) MISC by Does not apply route.     ELIQUIS 5 MG TABS tablet Take 5 mg by mouth 2 (two) times daily.      finasteride (PROSCAR) 5 MG tablet TAKE 1 TABLET BY MOUTH ONCE DAILY. 90 tablet 0   HUMALOG KWIKPEN 100 UNIT/ML KiwkPen Inject 10-12 Units into the skin 3 (three) times daily. Inject 10 units daily at breakfast, 10 units at lunch and up to 10 units or less at supper per SSI     Insulin Pen Needle 33G X 4 MM MISC To use with insulin pen 4 times per day. E 13.9     LANTUS SOLOSTAR 100 UNIT/ML Solostar Pen Inject 24 Units into the skin at bedtime.      linagliptin (TRADJENTA) 5 MG TABS tablet Take 5 mg by mouth daily.     omeprazole (PRILOSEC) 20 MG capsule Take 20 mg by mouth daily.     PROGRAF 1 MG capsule Take 1 mg by mouth every morning.     tacrolimus (PROGRAF) 0.5 MG capsule Take 0.5 mg by mouth at bedtime.     tamsulosin (FLOMAX) 0.4 MG CAPS capsule Take 0.8 mg by mouth daily.     No current facility-administered medications for this visit.   Facility-Administered  Medications Ordered in Other Visits  Medication Dose Route Frequency Provider Last Rate Last Admin   sodium chloride flush (NS) 0.9 % injection 3 mL  3 mL Intravenous Q12H Laurier Nancy, MD        Allergies:   Oxybutynin    Social History:   reports that he has quit smoking. He has never used smokeless tobacco. He reports that he does not drink alcohol and does not use drugs.   Family History:  family history includes Drug abuse in his brother and sister.    ROS:     Review of  Systems  Constitutional: Negative.   HENT: Negative.    Eyes: Negative.   Respiratory: Negative.    Gastrointestinal: Negative.   Genitourinary: Negative.   Musculoskeletal: Negative.   Skin: Negative.   Neurological: Negative.   Endo/Heme/Allergies: Negative.   Psychiatric/Behavioral: Negative.    All other systems reviewed and are negative.     All other systems are reviewed and negative.    PHYSICAL EXAM: VS:  BP 138/72   Pulse (!) 54   Ht 5\' 8"  (1.727 m)   Wt 172 lb (78 kg)   SpO2 97%   BMI 26.15 kg/m  , BMI Body mass index is 26.15 kg/m. Last weight:  Wt Readings from Last 3 Encounters:  11/11/23 172 lb (78 kg)  11/08/23 169 lb 11.2 oz (77 kg)  11/03/23 171 lb (77.6 kg)     Physical Exam Vitals reviewed.  Constitutional:      Appearance: Normal appearance. He is normal weight.  HENT:     Head: Normocephalic.     Nose: Nose normal.     Mouth/Throat:     Mouth: Mucous membranes are moist.  Eyes:     Pupils: Pupils are equal, round, and reactive to light.  Cardiovascular:     Rate and Rhythm: Normal rate and regular rhythm.     Pulses: Normal pulses.     Heart sounds: Normal heart sounds.  Pulmonary:     Effort: Pulmonary effort is normal.  Abdominal:     General: Abdomen is flat. Bowel sounds are normal.  Musculoskeletal:        General: Normal range of motion.     Cervical back: Normal range of motion.  Skin:    General: Skin is warm.  Neurological:     General:  No focal deficit present.     Mental Status: He is alert.  Psychiatric:        Mood and Affect: Mood normal.       EKG:   Recent Labs: 11/03/2023: ALT 20; BUN 28; Creatinine, Ser 1.26; Hemoglobin 14.9; Platelets 157; Potassium 5.0; Sodium 138    Lipid Panel    Component Value Date/Time   CHOL  10/20/2010 0400    94        ATP III CLASSIFICATION:  <200     mg/dL   Desirable  811-914  mg/dL   Borderline High  >=782    mg/dL   High          TRIG 956 (H) 10/23/2010 0600      Other studies Reviewed: Additional studies/ records that were reviewed today include:  Review of the above records demonstrates:       No data to display            ASSESSMENT AND PLAN:    ICD-10-CM   1. Atrial fibrillation, currently in sinus rhythm  Z86.79 losartan (COZAAR) 100 MG tablet    Comprehensive metabolic panel    nitroGLYCERIN (NITROSTAT) 0.4 MG SL tablet   Now in NSR on 400 amio    2. Sinus bradycardia  R00.1 losartan (COZAAR) 100 MG tablet    Comprehensive metabolic panel    nitroGLYCERIN (NITROSTAT) 0.4 MG SL tablet    3. Coronary artery disease involving coronary bypass graft of native heart with other forms of angina pectoris (HCC)  I25.708 losartan (COZAAR) 100 MG tablet    Comprehensive metabolic panel    nitroGLYCERIN (NITROSTAT) 0.4 MG SL tablet    4. Chest pain due to myocardial  ischemia, unspecified ischemic chest pain type  I25.9 losartan (COZAAR) 100 MG tablet    Comprehensive metabolic panel    nitroGLYCERIN (NITROSTAT) 0.4 MG SL tablet   Had cath LIMA to LAD, and SVG to PDA patent, LCX native no significant disease, treat medically. Add nito PRN CP    5. Heart failure, unspecified HF chronicity, unspecified heart failure type (HCC)  I50.9 losartan (COZAAR) 100 MG tablet    Comprehensive metabolic panel    nitroGLYCERIN (NITROSTAT) 0.4 MG SL tablet   BP high go to 100 losartan, check labs       Problem List Items Addressed This Visit       Cardiovascular  and Mediastinum   Coronary artery disease   Relevant Medications   losartan (COZAAR) 100 MG tablet   nitroGLYCERIN (NITROSTAT) 0.4 MG SL tablet   Other Relevant Orders   Comprehensive metabolic panel   Heart failure (HCC)   Relevant Medications   losartan (COZAAR) 100 MG tablet   nitroGLYCERIN (NITROSTAT) 0.4 MG SL tablet   Other Relevant Orders   Comprehensive metabolic panel     Other   Chest pain   Relevant Medications   losartan (COZAAR) 100 MG tablet   nitroGLYCERIN (NITROSTAT) 0.4 MG SL tablet   Other Relevant Orders   Comprehensive metabolic panel   Other Visit Diagnoses       Atrial fibrillation, currently in sinus rhythm    -  Primary   Now in NSR on 400 amio   Relevant Medications   losartan (COZAAR) 100 MG tablet   nitroGLYCERIN (NITROSTAT) 0.4 MG SL tablet   Other Relevant Orders   Comprehensive metabolic panel     Sinus bradycardia       Relevant Medications   losartan (COZAAR) 100 MG tablet   nitroGLYCERIN (NITROSTAT) 0.4 MG SL tablet   Other Relevant Orders   Comprehensive metabolic panel          Disposition:   No follow-ups on file.    Total time spent: 30 minutes  Signed,  Adrian Blackwater, MD  11/11/2023 12:04 PM    [facility]

## 2023-11-12 LAB — COMPREHENSIVE METABOLIC PANEL WITH GFR
ALT: 14 IU/L (ref 0–44)
AST: 19 IU/L (ref 0–40)
Albumin: 4.1 g/dL (ref 3.9–4.9)
Alkaline Phosphatase: 54 IU/L (ref 44–121)
BUN/Creatinine Ratio: 18 (ref 10–24)
BUN: 22 mg/dL (ref 8–27)
Bilirubin Total: 0.7 mg/dL (ref 0.0–1.2)
CO2: 19 mmol/L — ABNORMAL LOW (ref 20–29)
Calcium: 9.2 mg/dL (ref 8.6–10.2)
Chloride: 103 mmol/L (ref 96–106)
Creatinine, Ser: 1.2 mg/dL (ref 0.76–1.27)
Globulin, Total: 2.7 g/dL (ref 1.5–4.5)
Glucose: 115 mg/dL — ABNORMAL HIGH (ref 70–99)
Potassium: 4.5 mmol/L (ref 3.5–5.2)
Sodium: 138 mmol/L (ref 134–144)
Total Protein: 6.8 g/dL (ref 6.0–8.5)
eGFR: 65 mL/min/{1.73_m2} (ref >59)

## 2023-11-14 ENCOUNTER — Ambulatory Visit (INDEPENDENT_AMBULATORY_CARE_PROVIDER_SITE_OTHER)

## 2023-11-14 VITALS — BP 130/54 | HR 50 | Temp 97.7°F | Ht 68.0 in | Wt 172.2 lb

## 2023-11-14 DIAGNOSIS — I259 Chronic ischemic heart disease, unspecified: Secondary | ICD-10-CM | POA: Diagnosis not present

## 2023-11-14 DIAGNOSIS — I480 Paroxysmal atrial fibrillation: Secondary | ICD-10-CM

## 2023-11-14 DIAGNOSIS — E1122 Type 2 diabetes mellitus with diabetic chronic kidney disease: Secondary | ICD-10-CM | POA: Diagnosis not present

## 2023-11-14 DIAGNOSIS — D849 Immunodeficiency, unspecified: Secondary | ICD-10-CM

## 2023-11-14 DIAGNOSIS — Z794 Long term (current) use of insulin: Secondary | ICD-10-CM

## 2023-11-14 DIAGNOSIS — Z7689 Persons encountering health services in other specified circumstances: Secondary | ICD-10-CM

## 2023-11-14 DIAGNOSIS — E7800 Pure hypercholesterolemia, unspecified: Secondary | ICD-10-CM

## 2023-11-14 DIAGNOSIS — Z95811 Presence of heart assist device: Secondary | ICD-10-CM | POA: Diagnosis not present

## 2023-11-14 DIAGNOSIS — E559 Vitamin D deficiency, unspecified: Secondary | ICD-10-CM | POA: Diagnosis not present

## 2023-11-14 DIAGNOSIS — R269 Unspecified abnormalities of gait and mobility: Secondary | ICD-10-CM | POA: Diagnosis not present

## 2023-11-14 DIAGNOSIS — L97511 Non-pressure chronic ulcer of other part of right foot limited to breakdown of skin: Secondary | ICD-10-CM | POA: Insufficient documentation

## 2023-11-14 DIAGNOSIS — N1831 Chronic kidney disease, stage 3a: Secondary | ICD-10-CM | POA: Diagnosis not present

## 2023-11-14 DIAGNOSIS — G4733 Obstructive sleep apnea (adult) (pediatric): Secondary | ICD-10-CM | POA: Diagnosis not present

## 2023-11-14 NOTE — Progress Notes (Signed)
 New Patient Office Visit   Subjective   Patient ID: [name], male    DOB: [birth year]  Age: 67 y.o. MRN: [id number]  CC:  Chief Complaint  Patient presents with   Establish Care   HPI [name] is a new patient presenting with his daughter [name] (phn no: [phone]) to establish care. Patient used to see Dr. Abram Sander at [facility]. His insurance changed and is no longer covering his visit with his PCP thus establishing care with our clinic. H/O obtained from both patient and his daughter [name].    He  has a past medical history of Aortic atherosclerosis (HCC), Arthritis, BPH (benign prostatic hyperplasia), Carotid arterial disease (HCC), Coronary artery disease, DDD (degenerative disc disease), lumbar, Dyspnea, ESRD (end stage renal disease) (HCC), GERD (gastroesophageal reflux disease), History of blood transfusion, Hypertension, Long term current use of aspirin, Long term current use of immunosuppressive drug, Myocardial infarction (HCC) (2004), Myocardial infarction (HCC) (2005), On apixaban therapy, PAF (paroxysmal atrial fibrillation) (HCC), Sepsis due to gram-negative UTI (HCC) (04/27/2020), 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  HPI Patient has multiple co-morbidities and is established with multiple specialists. He does not have new concerns today.   1) Preventative:  - Immunization:  Patient is due for second dose of Shingrix.  Qualifies for tetanus booster.  UTD on pneumonia. -History of smoking, quit 30 years ago.  Does not qualify for low-dose CT lung cancer screening. AAA screening from 05/22/2020 did not show evidence of aortic aneurysm.  - Colorectal cancer screening: 12/30/2021: Next due 12/31/2031 - Social: Patient lives with his wife. 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  Other than few steps to get in their house no stairs inside. Daughter [name] takes him to medical appointments and is very involved in patient's healthcare.  - Exercises M-F. Needs caine for ambulation due to unsteady walking. Has seen [facility] ortho in the past. Not interested in PT.  - Does not drink alcohol.  - Allergy: Urinary retention with Oxybutynin.   2) Type II DM with CKD:  - Established with [facility] clinic endocrinology, sees Dr. Wendall Mola.  - Currently on basal-bolus sliding scale. Uses 24 U of Lantus at bedtime and sliding scale for Lispro. Checks home blood glucose twice a day.  Fasting ranging in 130-140 and nightly around 200. - All diabetic medication and supplies comes through Dr. Tedd Sias.  - UDT on diabetic eye exam, last eye exam 12/2022 through [facility]. - Has upcoming appointment with endocrinologist in 1 week.  Requesting all labs to be done through [place] clinic.  Is due for A1c recheck.   3) On chronic immunosuppression therapy S/P right kidney transplant presumed DM/HTN: 05/10/2015  - Established with [facility] transplant team. Following up with Dr. Dewitt Hoes every 6 months.  - On chronic immunosuppressive tacrolimus and mycophenolate.  4) Cardiovascular:  - Established with Laurier Nancy, MD with [facility], OV on 11/11/23. All cardiac medications through his office.  Underwent cardiac cath on 11/08/23.  - Atrial fibrillation: On 400 mg Amiodarone. Apixaban/Eliquis 5 mg BID - CAD with MI, s/p stent and chest pain on Nitroglycerin 0.4 mg Sl prn and Aspirin 81 mg. - Hyperlipidemia: On Atorvastatin 40, , Amlodipine 10 mg, Aspirin 81 mg, - HTN/CAD/CHF: Carvedilol 3.125 mgm BID, Losartan 100 mg daily.   5) BPH with  h/o urinary retention:  - [facility]es [facility], reports sees anyone available for follow up. Underwent UroLift on 06/13/23.  - Last OV with urology was on 07/18/24 saw Dr. Kandace Blitz. On (Finasteride, Flomax, Sinctura).  Following up with urology on q6 monthly basis.    6) B/L carotid artery disease, S/P left carotis endarterectomy.  - Sees [facility] vascular surgeon Dr. Festus Barren annually. On statin, Aspirin. - Last carotid duplex was on 05/31/23 R ICA 1-39 % stenosis an dpatent left carotid endarterctomy, annual imaging.   7) Vitamin D deficiency: Takes daily Vitamin D 2,000 U  8) GERD: PRN Omeprazole 20 mg, symptoms worse when he eats spicy food. No recent unintentional weight loss, dyspepsia, nausea, vomiting.   9) OSA: On CPAP for about 2 years. Uses everywhere from 5-7 hours per night.  Has not seen sleep specialist.   10) Had a h/o toe ulcer, resolved now. Sees podiatry clinic/[facility].  Outpatient Encounter Medications as of 11/14/2023  Medication Sig   ascorbic acid (VITAMIN C) 500 MG tablet Take 500 mg by mouth daily.   aspirin EC 81 MG EC tablet Take 1 tablet (81 mg total) by mouth daily at 6 (six) AM.   atorvastatin (LIPITOR) 40 MG tablet Take 40 mg by mouth at bedtime.   CELLCEPT 250 MG capsule Take 500 mg by mouth 2 (two) times daily.   Cholecalciferol (VITAMIN D) 50 MCG (2000 UT) CAPS Take 2,000 Units by mouth daily.   Continuous Blood Gluc Sensor (FREESTYLE LIBRE 2 SENSOR) MISC by Does not apply route.   ELIQUIS 5 MG TABS tablet Take 5 mg by mouth 2 (two) times daily.    finasteride (PROSCAR) 5 MG tablet TAKE 1 TABLET BY MOUTH ONCE DAILY.   HUMALOG KWIKPEN 100 UNIT/ML KiwkPen Inject 10-12 Units into the skin 3 (three) times daily. Inject 10 units daily at breakfast, 10 units at lunch and up to 10 units or less at supper per SSI   Insulin Pen Needle 33G X 4 MM MISC To use with insulin pen 4 times per day. E 13.9   LANTUS SOLOSTAR 100 UNIT/ML Solostar Pen Inject 24 Units into the skin at bedtime.    linagliptin (TRADJENTA) 5 MG TABS tablet Take 5 mg by mouth daily.   losartan (COZAAR) 100 MG tablet Take 1 tablet (100 mg total) by mouth daily.   nitroGLYCERIN (NITROSTAT) 0.4 MG SL  tablet Place 1 tablet (0.4 mg total) under the tongue every 5 (five) minutes as needed for chest pain.   omeprazole (PRILOSEC) 20 MG capsule Take 20 mg by mouth daily.   PROGRAF 1 MG capsule Take 1 mg by mouth every morning.   tacrolimus (PROGRAF) 0.5 MG capsule Take 0.5 mg by mouth at bedtime.   tamsulosin (FLOMAX) 0.4 MG CAPS capsule Take 0.8 mg by mouth daily.   amiodarone (PACERONE) 400 MG tablet Take 1 tablet (400 mg total) by mouth daily.   [DISCONTINUED] losartan (COZAAR) 50 MG tablet Take 50 mg by mouth in the morning and at bedtime.   [DISCONTINUED] potassium chloride SA (KLOR-CON M) 20 MEQ tablet Take 2 tablets (40 mEq total) by mouth once for 1 dose.   Facility-Administered Encounter Medications as of 11/14/2023  Medication   sodium chloride flush (NS) 0.9 % injection 3 mL   [DISCONTINUED] 0.9 %  sodium chloride infusion   [DISCONTINUED] 0.9 %  sodium chloride infusion   [DISCONTINUED] 0.9 %  sodium chloride infusion   [DISCONTINUED] 0.9% sodium chloride infusion   [  DISCONTINUED] 0.9% sodium chloride infusion   [DISCONTINUED] acetaminophen (TYLENOL) tablet 650 mg   [DISCONTINUED] fentaNYL (SUBLIMAZE) injection   [DISCONTINUED] Heparin (Porcine) in NaCl 2000-0.9 UNIT/L-% SOLN   [DISCONTINUED] iohexol (OMNIPAQUE) 300 MG/ML solution   [DISCONTINUED] midazolam (VERSED) injection   [DISCONTINUED] ondansetron (ZOFRAN) injection 4 mg   [DISCONTINUED] sodium chloride 0.9 % bolus   [DISCONTINUED] sodium chloride flush (NS) 0.9 % injection 3 mL   [DISCONTINUED] sodium chloride flush (NS) 0.9 % injection 3 mL   [DISCONTINUED] sodium chloride flush (NS) 0.9 % injection 3 mL    Past Surgical History:  Procedure Laterality Date   CATARACT EXTRACTION Bilateral    CATARACT EXTRACTION W/PHACO Right 02/03/2017   Procedure: CATARACT EXTRACTION PHACO AND INTRAOCULAR LENS PLACEMENT (IOC);  Surgeon: Lockie Mola, MD;  Location: [facility] ORS;  Service: Ophthalmology;  Laterality: Right;  [place]  00:35.8 AP% 12.8 CDE 4.57 Fluid lot # [id number]   COLONOSCOPY WITH PROPOFOL N/A 12/30/2021   Procedure: COLONOSCOPY WITH PROPOFOL;  Surgeon: Wyline Mood, MD;  Location: [facility] ENDOSCOPY;  Service: Gastroenterology;  Laterality: N/A;   CORONARY ANGIOPLASTY WITH STENT PLACEMENT Left 2004   stents x 2   CORONARY ANGIOPLASTY WITH STENT PLACEMENT Left 2005   stent x 1   CORONARY ANGIOPLASTY WITH STENT PLACEMENT Left 08/25/2007   Procedure: CORONARY ANGIOPLASTY WITH STENT PLACEMENT; Location: [facility]; Surgeon: Lennart Pall, MD   CORONARY ARTERY BYPASS GRAFT N/A 06/08/2006   Procedure: CORONARY ARTERY BYPASS GRAFT; Location: [facility]; Surgeon: Marshell Garfinkel, MD   CYSTOSCOPY WITH INSERTION OF UROLIFT N/A 06/13/2023   Procedure: CYSTOSCOPY WITH INSERTION OF UROLIFT;  Surgeon: [name], MD;  Location: [facility] ORS;  Service: Urology;  Laterality: N/A;   ENDARTERECTOMY Left 09/06/2019   Procedure: ENDARTERECTOMY CAROTID;  Surgeon: Annice Needy, MD;  Location: [facility] ORS;  Service: Vascular;  Laterality: Left;   HIP FRACTURE SURGERY     KIDNEY TRANSPLANT Right 05/10/2015   LEFT HEART CATH AND CORONARY ANGIOGRAPHY Left 05/20/2006   Procedure: LEFT HEART CATH AND CORONARY ANGIOGRAPHY; Location: [facility]; Surgeon: Adrian Blackwater, MD   LEFT HEART CATH AND CORONARY ANGIOGRAPHY Left 11/08/2023   Procedure: LEFT HEART CATH AND CORONARY ANGIOGRAPHY with possible intervention;  Surgeon: Laurier Nancy, MD;  Location: [facility] INVASIVE CV LAB;  Service: Cardiovascular;  Laterality: Left;   LEFT HEART CATH AND CORS/GRAFTS ANGIOGRAPHY Left 08/10/2007   Procedure: LEFT HEART CATH AND CORS/GRAFTS ANGIOGRAPHY; Location: [facility]; Surgeon: Adrian Blackwater, MD   LEFT HEART CATH AND CORS/GRAFTS ANGIOGRAPHY Left 05/04/2011   Procedure: LEFT HEART CATH AND CORS/GRAFTS ANGIOGRAPHY; Location: [facility]; Surgeon: Adrian Blackwater, MD    ROS As per HPI    Objective    BP (!) 130/54   Pulse (!) 50   Temp 97.7 F (36.5 C) (Oral)   Ht 5\' 8"  (1.727 m)    Wt 172 lb 3.2 oz (78.1 kg)   SpO2 93%   BMI 26.18 kg/m   Physical Exam Constitutional:      Appearance: Normal appearance.  HENT:     Right Ear: Tympanic membrane normal.     Left Ear: Tympanic membrane normal.     Mouth/Throat:     Mouth: Mucous membranes are moist.     Pharynx: No oropharyngeal exudate.  Cardiovascular:     Rate and Rhythm: Bradycardia present.  Pulmonary:     Effort: Pulmonary effort is normal.     Breath sounds: Normal breath sounds.  Abdominal:     General: There is no distension.     Palpations:  Abdomen is soft.     Tenderness: There is no abdominal tenderness.  Musculoskeletal:     Cervical back: Normal range of motion and neck supple.     Right lower leg: No edema.     Left lower leg: No edema.  Skin:    General: Skin is warm.  Neurological:     Mental Status: He is alert and oriented to person, place, and time.     Gait: Gait abnormal (slow).  Psychiatric:        Mood and Affect: Mood normal.       Assessment & Plan:  Encounter to establish care  Presence of heart assist device [facility])  Type 2 diabetes mellitus with stage 3a chronic kidney disease, with long-term current use of insulin (HCC) Assessment & Plan: Reviewed patient's HbA1c from 06/09/23 which was 7%. Patient is due for repeat HbA1c check. He would like to get his diabetic labs including HbA1c, urine microalbumin through his endocrinologist At [facility] endocrinology, Dr. Buel Carls. Has appointment with her in one week. Deferred labs, medication refill to Dr. Solum.    OSA (obstructive sleep apnea) Assessment & Plan: On CPAP for about 2 years. Uses everywhere from 5-7 hours per night.  Has not seen sleep specialist. Recommend establishing care with sleep specialist. Patient and daughter agreeable. Referral to sleep clinic made.   Orders: -     Pulmonary Visit  Chest pain due to myocardial ischemia, unspecified ischemic chest pain type Assessment & Plan: Management  per cardiology Dr. Meredeth Stallion, on prn sublingual Nitroglycerin 0.4 mg. Denies chest pain today.    Immunosuppression [facility]) Assessment & Plan: On chronic immunosuppressive therapy with Tacrolimus and Mycophenolate s/p right kidney transplant in 05/10/2015. At increased risk of infection. Continue f/u with transplant clinic with [facility].   Patient is also due for second dose of Shinrix vaccine and Tdap booster. Mychart message sent to daughter to recommend updating above immunization through our clinic or local pharmacy.    Hypercholesteremia  Paroxysmal atrial fibrillation (HCC)  Vitamin D deficiency Assessment & Plan: On daily OTC Vitamin D 2,000 units. Recommend repeating vitamin D level. Patient prefers repeat labs through his transplant physician.    Gait difficulty Assessment & Plan: Slow gait. Patient is on Eliquis for A. Fib. Using cane for ambulation. I recommend physical therapy for gait, strengthening muscle to reduce risk of fall. Patient has been to PT in the past. He exercises daily from M-F. Reports he is aware of risk of fall and potential health complications. Declined PT at this time.    I spent more than 45 minutes in reviewing patient's medical history, current medications, chief complaints, physical exam, making recommendations and coordinating patient care.   Return in about 6 months (around 05/15/2024) for Chronic follow up.   Jacklin Mascot, MD

## 2023-11-14 NOTE — Assessment & Plan Note (Addendum)
 On chronic immunosuppressive therapy with Tacrolimus and Mycophenolate s/p right kidney transplant in 05/10/2015. At increased risk of infection. Continue f/u with transplant clinic with [facility].   Patient is also due for second dose of Shinrix vaccine and Tdap booster. Mychart message sent to daughter to recommend updating above immunization through our clinic or local pharmacy.

## 2023-11-14 NOTE — Assessment & Plan Note (Signed)
 On chronic immunosuppressive therapy. Presumed to be secondary to DM and HTN complication. Management per [facility] transplant team/ Dr. Elizabeth Kotzen every 6 months. Continue f/u.

## 2023-11-14 NOTE — Assessment & Plan Note (Signed)
 BP goal <130/80 mmHg. Follows up with cardiologist Cherrie Cornwall, MD. BP within goal, continue current management and medications per Dr. Meredeth Stallion.

## 2023-11-14 NOTE — Assessment & Plan Note (Signed)
 Slow gait. Patient is on Eliquis for A. Fib. Using cane for ambulation. I recommend physical therapy for gait, strengthening muscle to reduce risk of fall. Patient has been to PT in the past. He exercises daily from M-F. Reports he is aware of risk of fall and potential health complications. Declined PT at this time.

## 2023-11-14 NOTE — Assessment & Plan Note (Signed)
 On daily OTC Vitamin D 2,000 units. Recommend repeating vitamin D level. Patient prefers repeat labs through his transplant physician.

## 2023-11-14 NOTE — Assessment & Plan Note (Signed)
 Management per cardiology Dr. Meredeth Stallion, on prn sublingual Nitroglycerin 0.4 mg. Denies chest pain today.

## 2023-11-14 NOTE — Assessment & Plan Note (Addendum)
 Resolved. Given h/o type II DM on immunosuppressive therapy recommend continuing f/u with podiatry for foot care.

## 2023-11-14 NOTE — Assessment & Plan Note (Signed)
 Reviewed patient's HbA1c from 06/09/23 which was 7%. Patient is due for repeat HbA1c check. He would like to get his diabetic labs including HbA1c, urine microalbumin through his endocrinologist At [facility] endocrinology, Dr. Buel Carls. Has appointment with her in one week. Deferred labs, medication refill to Dr. Solum.

## 2023-11-14 NOTE — Assessment & Plan Note (Signed)
 open heart surgery 06/30/2006 2-V bypass; MI 2004 2 stents, MI 2005 1 stent; 1 stent 1st diagonal 08/25/2007. On Atorvastatin 40 mg, Aspirin 81 mg daily Carvedilol 3.125 mg BID. Established with cardiology, refill and management per cardiology.

## 2023-11-14 NOTE — Assessment & Plan Note (Signed)
 On CPAP for about 2 years. Uses everywhere from 5-7 hours per night.  Has not seen sleep specialist. Recommend establishing care with sleep specialist. Patient and daughter agreeable. Referral to sleep clinic made.

## 2023-11-22 ENCOUNTER — Encounter: Payer: Self-pay | Admitting: Sleep Medicine

## 2023-11-22 ENCOUNTER — Ambulatory Visit (INDEPENDENT_AMBULATORY_CARE_PROVIDER_SITE_OTHER): Admitting: Sleep Medicine

## 2023-11-22 VITALS — BP 108/60 | HR 45 | Ht 68.0 in | Wt 171.2 lb

## 2023-11-22 DIAGNOSIS — Z87891 Personal history of nicotine dependence: Secondary | ICD-10-CM

## 2023-11-22 DIAGNOSIS — Z794 Long term (current) use of insulin: Secondary | ICD-10-CM | POA: Diagnosis not present

## 2023-11-22 DIAGNOSIS — E119 Type 2 diabetes mellitus without complications: Secondary | ICD-10-CM | POA: Diagnosis not present

## 2023-11-22 DIAGNOSIS — I10 Essential (primary) hypertension: Secondary | ICD-10-CM

## 2023-11-22 DIAGNOSIS — G4733 Obstructive sleep apnea (adult) (pediatric): Secondary | ICD-10-CM

## 2023-11-22 DIAGNOSIS — E1129 Type 2 diabetes mellitus with other diabetic kidney complication: Secondary | ICD-10-CM

## 2023-11-22 NOTE — Progress Notes (Addendum)
 Name:[name] MRN: [id number] DOB: 1956-01-30   CHIEF COMPLAINT:  ESTABLISH CARE FOR OSA   HISTORY OF PRESENT ILLNESS:  [name] is a 68 y.o. w/ a h/o OSA, CAD, DMII, HTN, atrial fibrillation and hyperlipidemia who presents to establish care for OSA. Reports that he was was initially diagnosed with OSA a few years ago and subsequently started on CPAP therapy. Reports using CPAP therapy every night, which is confirmed by compliance data. He is currently using a full face mask. Denies snoring with CPAP therapy. Reports feeling more refreshed upon awakening with CPAP therapy.    Bedtime 9:30 pm Sleep onset 10 mins Rise time 6:30 am   EPWORTH SLEEP SCORE 0    11/22/2023    8:37 AM  Results of the Epworth flowsheet  Sitting and reading 0  Watching TV 0  Sitting, inactive in a public place (e.g. a theatre or a meeting) 0  As a passenger in a car for an hour without a break 0  Lying down to rest in the afternoon when circumstances permit 0  Sitting and talking to someone 0  Sitting quietly after a lunch without alcohol 0     PAST MEDICAL HISTORY :   has a past medical history of Aortic atherosclerosis (HCC), Arthritis, BPH (benign prostatic hyperplasia), Carotid arterial disease (HCC), Coronary artery disease, DDD (degenerative disc disease), lumbar, Dyspnea, ESRD (end stage renal disease) (HCC), GERD (gastroesophageal reflux disease), History of blood transfusion, Hypertension, Long term current use of aspirin , Long term current use of immunosuppressive drug, Myocardial infarction (HCC) (2004), Myocardial infarction (HCC) (2005), On apixaban  therapy, PAF (paroxysmal atrial fibrillation) (HCC), Sepsis due to gram-negative UTI (HCC) (04/27/2020), 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  has a past surgical history that includes Coronary artery bypass graft (N/A,  06/08/2006); Coronary angioplasty with stent (Left, 2004); Hip fracture surgery; Kidney transplant (Right, 05/10/2015); Cataract extraction w/PHACO (Right, 02/03/2017); Cataract extraction (Bilateral); Endarterectomy (Left, 09/06/2019); Colonoscopy with propofol  (N/A, 12/30/2021); Coronary angioplasty with stent (Left, 2005); LEFT HEART CATH AND CORONARY ANGIOGRAPHY (Left, 05/20/2006); LEFT HEART CATH AND CORS/GRAFTS ANGIOGRAPHY (Left, 08/10/2007); Coronary angioplasty with stent (Left, 08/25/2007); LEFT HEART CATH AND CORS/GRAFTS ANGIOGRAPHY (Left, 05/04/2011); Cystoscopy with insertion of urolift (N/A, 06/13/2023); and LEFT HEART CATH AND CORONARY ANGIOGRAPHY (Left, 11/08/2023). Prior to Admission medications   Medication Sig Start Date End Date Taking? Authorizing Provider  amiodarone  (PACERONE ) 400 MG tablet Take 1 tablet (400 mg total) by mouth daily. 11/03/23  Yes Leonard Cornwall, MD  ascorbic acid (VITAMIN C) 500 MG tablet Take 500 mg by mouth daily.   Yes [provider]  aspirin  EC 81 MG EC tablet Take 1 tablet (81 mg total) by mouth daily at 6 (six) AM. 09/08/19  Yes [name], Leonard Manson, PA-C  atorvastatin  (LIPITOR) 40 MG tablet Take 40 mg by mouth at bedtime. 12/23/14  Yes [provider]  CELLCEPT  250 MG capsule Take 500 mg by mouth 2 (two) times daily. 08/12/21  Yes [provider]  Cholecalciferol  (VITAMIN D) 50 MCG (2000 UT) CAPS Take 2,000 Units by mouth daily.   Yes [provider]  Continuous Blood Gluc Sensor (FREESTYLE LIBRE 2 SENSOR) MISC by Does not apply route.   Yes [provider]  ELIQUIS  5 MG TABS tablet Take 5 mg by mouth 2 (two) times daily.  04/21/18  Yes [provider]  finasteride  (PROSCAR ) 5 MG  tablet TAKE 1 TABLET BY MOUTH ONCE DAILY. 11/12/21  Yes Leonard Gimenez, MD  HUMALOG KWIKPEN 100 UNIT/ML KiwkPen Inject 10-12 Units into the skin 3 (three) times daily. Inject 10 units daily at breakfast, 10 units at lunch and up to 10  units or less at supper per SSI   Yes [provider]  Insulin  Pen Needle 33G X 4 MM MISC To use with insulin  pen 4 times per day. E 13.9 02/19/16  Yes [provider]  LANTUS  SOLOSTAR 100 UNIT/ML Solostar Pen Inject 24 Units into the skin at bedtime.  01/18/15  Yes [provider]  linagliptin (TRADJENTA) 5 MG TABS tablet Take 5 mg by mouth daily. 11/23/19  Yes [provider]  losartan  (COZAAR ) 100 MG tablet Take 1 tablet (100 mg total) by mouth daily. 11/11/23 11/10/24 Yes Leonard Cornwall, MD  nitroGLYCERIN  (NITROSTAT ) 0.4 MG SL tablet Place 1 tablet (0.4 mg total) under the tongue every 5 (five) minutes as needed for chest pain. 11/11/23 11/10/24 Yes Leonard Cornwall, MD  omeprazole (PRILOSEC) 20 MG capsule Take 20 mg by mouth daily.   Yes [provider]  PROGRAF  1 MG capsule Take 1 mg by mouth every morning. 08/11/16  Yes [provider]  tacrolimus  (PROGRAF ) 0.5 MG capsule Take 0.5 mg by mouth at bedtime. 09/08/23  Yes [provider]  tamsulosin  (FLOMAX ) 0.4 MG CAPS capsule Take 0.8 mg by mouth daily.   Yes [provider]   Allergies  Allergen Reactions   Oxybutynin  Other (See Comments)    Incomplete bladder emptying    FAMILY HISTORY:  family history includes Drug abuse in his brother and sister; Osteoporosis in his mother. SOCIAL HISTORY:  reports that he has quit smoking. He has never used smokeless tobacco. He reports that he does not drink alcohol and does not use drugs.   Review of Systems:  Gen:  Denies  fever, sweats, chills weight loss  HEENT: Denies blurred vision, double vision, ear pain, eye pain, hearing loss, nose bleeds, sore throat Cardiac:  No dizziness, chest pain or heaviness, chest tightness,edema, No JVD Resp:   No cough, -sputum production, -shortness of breath,-wheezing, -hemoptysis,  Gi: Denies swallowing difficulty, stomach pain, nausea or vomiting, diarrhea, constipation, bowel  incontinence Gu:  Denies bladder incontinence, burning urine Ext:   Denies Joint pain, stiffness or swelling Skin: Denies  skin rash, easy bruising or bleeding or hives Endoc:  Denies polyuria, polydipsia , polyphagia or weight change Psych:   Denies depression, insomnia or hallucinations  Other:  All other systems negative  VITAL SIGNS: BP 108/60   Pulse (!) 45   Ht 5\' 8"  (1.727 m)   Wt 171 lb 3.2 oz (77.7 kg)   SpO2 98%   BMI 26.03 kg/m    Physical Examination:   General Appearance: No distress  EYES PERRLA, EOM intact.   NECK Supple, No JVD Pulmonary: normal breath sounds, No wheezing.  CardiovascularNormal S1,S2.  No m/r/g.   Abdomen: Benign, Soft, non-tender. Skin:   warm, no rashes, no ecchymosis  Extremities: normal, no cyanosis, clubbing. Neuro:without focal findings,  speech normal  PSYCHIATRIC: Mood, affect within normal limits.   ASSESSMENT AND PLAN  OSA Patient is using and benefiting from CPAP therapy. Counseled patient on increasing CPAP usage to 7-8 hours per night. Order for CPAP supplies completed. Discussed the consequences of untreated sleep apnea. Advised not to drive drowsy for safety of patient and others. Will follow up in 1 year.  DMII Stable, on current management.  HTN Stable, on current management. Following with PCP.    Patient  satisfied with Plan of action and management. All questions answered  I spent a total of 32 minutes reviewing chart data, face-to-face evaluation with the patient, counseling and coordination of care as detailed above.    Frona Yost, M.D.  Sleep Medicine [facility]

## 2023-11-22 NOTE — Addendum Note (Signed)
 Addended by: Leen Tworek on: 11/22/2023 10:25 AM   Modules accepted: Level of Service

## 2023-11-22 NOTE — Patient Instructions (Signed)

## 2023-11-28 DIAGNOSIS — Z940 Kidney transplant status: Principal | ICD-10-CM

## 2023-11-28 MED ORDER — MYCOPHENOLATE MOFETIL 250 MG CAPSULE
ORAL_CAPSULE | Freq: Two times a day (BID) | ORAL | 3 refills | 90.00000 days | Status: CP
Start: 2023-11-28 — End: 2024-11-27
  Filled 2024-01-06: qty 360, 90d supply, fill #0

## 2023-11-28 NOTE — Unmapped (Signed)
 [facility] Specialty Medication Onboarding    Specialty Medication: Mycophenolate 250mg  capsule  Prior Authorization: Not Required   Financial Assistance: No - copay  <$25  Final Copay/Day Supply: $31.40 / 90 days    Insurance Restrictions: None     Notes to Pharmacist: Pt previously has mfg assistance, but new insurance copay doesn't meet criteria for renewal (expired 11/17/2023). $10.53 for 30ds  Credit Card on File: no  Start Date on Rx:  N/A    The triage team has completed the benefits investigation and has determined that the patient is able to fill this medication at [facility]. Please contact the patient to complete the onboarding or follow up with the prescribing physician as needed.

## 2023-11-28 NOTE — Unmapped (Addendum)
 4/30: patient is aware per triage, he does not qualify for mfg re-enrollment, would like to get this med from [facility] going forward, however due to over 1 month on hand wants call back in late May to set up first delivery-ef    4/30: patient is aware [facility] requires credit/debit card on file to send out medications -ef    The following medication is onboarded in this note:  Mycophenolate $10.53/30ds or $31.40/90ds not part b    [facility]    Patient Onboarding/Medication Counseling    Steven Lynn is a 68 y.o. male with kidney transplant who I am counseling today on continuation of therapy.  I am speaking to the patient.    Was a Nurse, learning disability used for this call? Yes, spanish. Patient language is appropriate in WAM    Verified patient's date of birth / HIPAA.    Specialty medication(s) to be sent: n/a      Non-specialty medications/supplies to be sent: n/a      Medications not needed at this time: n/a         The patient declined counseling on medication administration, missed dose instructions, goals of therapy, side effects and monitoring parameters, warnings and precautions, drug/food interactions, and storage, handling precautions, and disposal because they have taken the medication previously. The information in the declined sections below are for informational purposes only and was not discussed with patient.   Cellcept (mycopheonlate mofetil)    Medication & Administration     Dosage: Take 2 capsules (500mg  total) by mouth twice daily    Administration:   Take by mouth with or without food.   Taking with food can minimize GI side effects.   Swallow capsules whole, do not crush or chew.  Oral suspension should be shaken well prior to administration.  Do not mix with other medications and discard any unused portion 60 days after constitution.      Adherence/Missed dose instructions:  Take a missed dose as soon as you remember it . If it is close to the time of your next dose, skip the missed dose and resume your normal schedule.Never take 2 doses to try and catch up from a missed dose.    Goals of Therapy     Prevent organ rejection    Side Effects & Monitoring Parameters     Feeling tired or weak  Shakiness  Trouble sleeping  Diarrhea, abdominal pain, nausea, vomiting, constipation or decreased appetite  Decreases in blood counts   Back or joint pain  Hypertension or hypotension  High blood sugar  Headache  Skin rash    The following side effects should be reported to the provider:  Reduced immune function - report signs of infection such as fever; chills; body aches; very bad sore throat; ear or sinus pain; cough; more sputum or change in color of sputum; pain with passing urine; wound that will not heal, etc.  Also at a slightly higher risk of some malignancies (mainly skin and blood cancers) due to this reduced immune function.  Allergic reaction (rash, hives, swelling, shortness of breath)  High blood sugar (confusion, feeling sleepy, more thirst, more hungry, passing urine more often, flushing, fast breathing, or breath that smells like fruit)  Electrolyte issues (mood changes, confusion, muscle pain or weakness, a heartbeat that does not feel normal, seizures, not hungry, or very bad upset stomach or throwing up)  High or low blood pressure (bad headache or dizziness, passing out, or change in  eyesight)  Kidney issues (unable to pass urine, change in how much urine is passed, blood in the urine, or a big weight gain)  Skin (oozing, heat, swelling, redness, or pain), UTI and other infections   Chest pain or pressure  Abnormal heartbeat  Unexplained bleeding or bruising  Abnormal burning, numbness, or tingling  Muscle cramps,  Yellowing of skin or eyes    Monitoring parameters  Pregnancy   CBC   Renal and hepatic function    Contraindications, Warnings, & Precautions     *This is a REMS drug and an FDA-approved patient medication guide will be printed with each dispensation  Black Box Warning: Infections   Black Box Warning: Lymphoproliferative disorders - risk of development of lymphoma and skin malignancy is increased  Black Box Warning: Use during pregnancy is associated with increased risks of first trimester pregnancy loss and congenital malformations.   Black Box Warning: Females of reproductive potential should use contraception during treatment and for 6 weeks after therapy is discontinued  Is patient using an effective method of contraception? Not Applicable  If yes, method of contraception:  patient is male  CNS depression  New or reactivated viral infections  Neutropenia  Male patients and/or their male partners should use effective contraception during treatment of the male patient and for at least 3 months after last dose.  Breastfeeding is not recommended during therapy and for 6 weeks after last dose    Drug/Food Interactions     Medication list reviewed in Epic. The patient was instructed to inform the care team before taking any new medications or supplements.  No interactions noted that clinic is not already monitoring .   Separate doses of antacids and this medication  Check with your doctor before getting any vaccinations    Storage, Handling Precautions, & Disposal     Store at room temperature in a dry place  This medication is considered hazardous. Wash hands after handling and store out of reach or others, including children and pets.      Current Medications (including OTC/herbals), Comorbidities and Allergies     Current Outpatient Medications   Medication Sig Dispense Refill    apixaban (ELIQUIS) 5 mg Tab Take 1 tablet (5 mg total) by mouth Two (2) times a day.      ascorbic acid, vitamin C, (VITAMIN C) 500 MG tablet Take 1 tablet (500 mg total) by mouth daily.      aspirin (ECOTRIN) 81 MG tablet TAKE ONE (1) TABLET BY MOUTH EVERY DAY 90 tablet 4    atorvastatin (LIPITOR) 40 MG tablet TOME 1 TABLETA POR LA BOCA  DIARIAMENTE CON LA COMIDA  DE LA TARDE 90 tablet 4 blood sugar diagnostic Strp ge glucose test strips to check bs 4 times per day. E 13.9 120 strip 6    cholecalciferol, vitamin D3-50 mcg, 2,000 unit,, 50 mcg (2,000 unit) cap Take 1 capsule (50 mcg total) by mouth daily. 90 capsule 3    finasteride (PROSCAR) 5 mg tablet Take 1 tablet (5 mg total) by mouth daily.      flash glucose sensor (FREESTYLE LIBRE 14 DAY SENSOR) kit by Miscellaneous route.      insulin glargine (LANTUS SOLOSTAR) 100 unit/mL (3 mL) injection pen Inject 0.24 mL (24 Units total) under the skin nightly. DM E11.9 10 pen 1    insulin lispro (HUMALOG KWIKPEN INSULIN) 100 unit/mL injection pen Inject 10 units at breakfast, 10 units at lunch, and SS scale units at dinner. Inject  0-12 units based on sliding scale for BG > 150. 5 pen 5    lancets Misc Use to check blood sugars 4 times per day. 200 each 11    linagliptin (TRADJENTA) 5 mg Tab Take 1 tablet (5 mg total) by mouth daily. 90 tablet 3    losartan (COZAAR) 50 MG tablet Take 1 tablet (50 mg total) by mouth nightly. 360 tablet 0    mycophenolate (CELLCEPT) 250 mg capsule Take 2 capsules (500 mg total) by mouth two (2) times a day. 360 capsule 3    omeprazole (PRILOSEC) 20 MG capsule Take 1 capsule (20 mg total) by mouth daily.      OMNIPOD 5 PACK PODS       pen needle, diabetic (ADVOCATE PEN NEEDLE) 33 gauge x 5/32 Ndle To use with insulin pen 4 times per day. E 13.9 120 each 6    pen needle, diabetic (PEN NEEDLE) 31 gauge x 1/4 Ndle E11.9; check blood glucose TID AC 100 each 5    tacrolimus (PROGRAF) 0.5 MG capsule Take 1 capsule (0.5 mg total) by mouth nightly. 90 capsule 3    tacrolimus (PROGRAF) 1 MG capsule Take 1 capsule (1 mg total) by mouth in the morning. 90 capsule 3    tamsulosin (FLOMAX) 0.4 mg capsule Take 1 capsule (0.4 mg total) by mouth two (2) times a day. 180 capsule 3     No current facility-administered medications for this visit.       Allergies   Allergen Reactions    Oxybutynin Other (See Comments)     Incomplete bladder emptying       Patient Active Problem List   Diagnosis    Coronary atherosclerosis of native coronary artery    Type II diabetes mellitus    Essential hypertension    Diabetes mellitus    Coronary artery disease    Vitamin D deficiency    Hypertension    Hypercholesteremia    Pulmonary scarring    Kidney 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/10/2015    Paroxysmal atrial fibrillation    Immunosuppression    Aftercare following organ transplant       Medication list has been reviewed and updated in Epic: Yes    Allergies have been reviewed and updated in Epic: Yes    Appropriateness of Therapy     Acute infections noted within Epic:  No active infections  Patient reported infection: None    Is the medication and dose appropriate based on diagnosis, medication list, comorbidities, allergies, medical history, patient???s ability to self-administer the medication, and therapeutic goals? Yes    Prescription has been clinically reviewed: Yes      Baseline Quality of Life Assessment      How many days over the past month did your transplant  keep you from your normal activities? For example, brushing your teeth or getting up in the morning. 0    Financial Information     Medication Assistance provided: does not qualify per triage based on copay    Anticipated copay of $$10.53/30ds or $31.40/90ds not part b reviewed with patient. Verified delivery address.    Delivery Information     Scheduled delivery date: n/a    Expected start date: patient is already taking (last got from mfg)      Medication will be delivered via n/a to the  n/a  address in [place].  This shipment will not require a signature.      Explained the services we provide at [facility]  Special[facility] Delivery [facility] and that each month we would call to set up refills.  Stressed importance of returning phone calls so that we could ensure they receive their medications in time each month.  Informed patient that we should be setting up refills 7-10 days prior to when they will run out of medication.  A pharmacist will reach out to perform a clinical assessment periodically.  Informed patient that a welcome packet, containing information about our pharmacy and other support services, a Notice of Privacy Practices, and a drug information handout will be sent.      The patient or caregiver noted above participated in the development of this care plan and knows that they can request review of or adjustments to the care plan at any time.      Patient or caregiver verbalized understanding of the above information as well as how to contact the pharmacy at [phone] option 4 with any questions/concerns.  The pharmacy is open Monday through Friday 8:30am-4:30pm.  A pharmacist is available 24/7 via pager to answer any clinical questions they may have.    Patient Specific Needs     Does the patient have any physical, cognitive, or cultural barriers? No    Does the patient have adequate living arrangements? (i.e. the ability to store and take their medication appropriately) Yes    Did you identify any home environmental safety or security hazards? No    Patient prefers to have medications discussed with  Patient     Is the patient or caregiver able to read and understand education materials at a high school level or above? Yes    Patient's primary language is  Spanish     Is the patient high risk? Yes, patient is taking a REMS drug. Medication is dispensed in compliance with REMS program    Does the patient have an additional or emergency contact listed in their chart? Yes    SOCIAL DETERMINANTS OF HEALTH     At the [facility], we have learned that life circumstances - like trouble affording food, housing, utilities, or transportation can affect the health of many of our patients.   That is why we wanted to ask: are you currently experiencing any life circumstances that are negatively impacting your health and/or quality of life? Patient declined to answer    Social Drivers of Health     Food Insecurity: Not on file   Tobacco Use: Medium Risk (08/06/2023)    Patient History     Smoking Tobacco Use: Former     Smokeless Tobacco Use: Never     Passive Exposure: Not on file   Transportation Needs: Not on file   Alcohol Use: Not on file   Housing: Not on file   Physical Activity: Not on file   Utilities: Not on file   Stress: Not on file   Interpersonal Safety: Not on file   Substance Use: Not on file (06/07/2023)   Intimate Partner Violence: Not on file   Social Connections: Not on file   Financial Resource Strain: Not on file   Health Literacy: Not on file   Internet Connectivity: Not on file       Would you be willing to receive help with any of the needs that you have identified today? Not applicable       Christine Cozier, PharmD  [facility] Specialty Pharmacist

## 2023-11-30 ENCOUNTER — Ambulatory Visit

## 2023-11-30 NOTE — Unmapped (Signed)
 4/29: patient is aware [facility] requires credit/debit card on file for all meds with prices. He has over 1 month on hand of this medication from previous fill so would like call back at end of May to set up delivery in June. He is aware to contact [facility] with any needs sooner. Clinic is also aware -[facility] Delivery [facility] Clinical Assessment & Refill Coordination Note    Steven Lynn, DOB: 1956-07-01  Phone: [phone] (home)     All above HIPAA information was verified with patient.     Was a Nurse, learning disability used for this call? No    Specialty Medication(s):   Mycophenolate (cellcept) 250mg      Current Outpatient Medications   Medication Sig Dispense Refill    apixaban (ELIQUIS) 5 mg Tab Take 1 tablet (5 mg total) by mouth Two (2) times a day.      ascorbic acid, vitamin C, (VITAMIN C) 500 MG tablet Take 1 tablet (500 mg total) by mouth daily.      aspirin (ECOTRIN) 81 MG tablet TAKE ONE (1) TABLET BY MOUTH EVERY DAY 90 tablet 4    atorvastatin (LIPITOR) 40 MG tablet TOME 1 TABLETA POR LA BOCA  DIARIAMENTE CON LA COMIDA  DE LA TARDE 90 tablet 4    blood sugar diagnostic Strp ge glucose test strips to check bs 4 times per day. E 13.9 120 strip 6    cholecalciferol, vitamin D3-50 mcg, 2,000 unit,, 50 mcg (2,000 unit) cap Take 1 capsule (50 mcg total) by mouth daily. 90 capsule 3    finasteride (PROSCAR) 5 mg tablet Take 1 tablet (5 mg total) by mouth daily.      flash glucose sensor (FREESTYLE LIBRE 14 DAY SENSOR) kit by Miscellaneous route.      insulin glargine (LANTUS SOLOSTAR) 100 unit/mL (3 mL) injection pen Inject 0.24 mL (24 Units total) under the skin nightly. DM E11.9 10 pen 1    insulin lispro (HUMALOG KWIKPEN INSULIN) 100 unit/mL injection pen Inject 10 units at breakfast, 10 units at lunch, and SS scale units at dinner. Inject 0-12 units based on sliding scale for BG > 150. 5 pen 5    lancets Misc Use to check blood sugars 4 times per day. 200 each 11    linagliptin (TRADJENTA) 5 mg Tab Take 1 tablet (5 mg total) by mouth daily. 90 tablet 3    losartan (COZAAR) 50 MG tablet Take 1 tablet (50 mg total) by mouth nightly. 360 tablet 0    mycophenolate (CELLCEPT) 250 mg capsule Take 2 capsules (500 mg total) by mouth two (2) times a day. 360 capsule 3    omeprazole (PRILOSEC) 20 MG capsule Take 1 capsule (20 mg total) by mouth daily.      OMNIPOD 5 PACK PODS       pen needle, diabetic (ADVOCATE PEN NEEDLE) 33 gauge x 5/32 Ndle To use with insulin pen 4 times per day. E 13.9 120 each 6    pen needle, diabetic (PEN NEEDLE) 31 gauge x 1/4 Ndle E11.9; check blood glucose TID AC 100 each 5    tacrolimus (PROGRAF) 0.5 MG capsule Take 1 capsule (0.5 mg total) by mouth nightly. 90 capsule 3    tacrolimus (PROGRAF) 1 MG capsule Take 1 capsule (1 mg total) by mouth in the morning. 90 capsule 3    tamsulosin (FLOMAX) 0.4 mg capsule Take 1 capsule (0.4 mg total) by mouth two (2) times a day.  180 capsule 3     No current facility-administered medications for this visit.        Changes to medications: Steven Lynn reports no changes at this time.    Medication list has been reviewed and updated in Epic: Yes    Allergies   Allergen Reactions    Oxybutynin Other (See Comments)     Incomplete bladder emptying       Changes to allergies: No    Allergies have been reviewed and updated in Epic: Yes    SPECIALTY MEDICATION ADHERENCE     Cellcept mycophenolate 250mg   : 45 days of medicine on hand from outside pharmacy fill      Medication Adherence    Patient reported X missed doses in the last month: 0  Specialty Medication: cellcept mycophenolate 250mg   Patient is on additional specialty medications: No          Specialty medication(s) dose(s) confirmed: Regimen is correct and unchanged.     Are there any concerns with adherence? No    Adherence counseling provided? Not needed    CLINICAL MANAGEMENT AND INTERVENTION      Clinical Benefit Assessment:    Do you feel the medicine is effective or helping your condition? Yes    Clinical Benefit counseling provided? Not needed    Adverse Effects Assessment:    Are you experiencing any side effects? No    Are you experiencing difficulty administering your medicine? No    Quality of Life Assessment:    Quality of Life    Rheumatology  Oncology  Dermatology  Cystic Fibrosis          How many days over the past month did your transplant  keep you from your normal activities? For example, brushing your teeth or getting up in the morning. 0    Have you discussed this with your provider? Not needed    Acute Infection Status:    Acute infections noted within Epic:  No active infections    Patient reported infection: None    Therapy Appropriateness:    Is therapy appropriate based on current medication list, adverse reactions, adherence, clinical benefit and progress toward achieving therapeutic goals? Yes, therapy is appropriate and should be continued     Clinical Intervention:    Was an intervention completed as part of this clinical assessment? No    DISEASE/MEDICATION-SPECIFIC INFORMATION      N/A    Solid Organ Transplant: Not Applicable    PATIENT SPECIFIC NEEDS     Does the patient have any physical, cognitive, or cultural barriers? No    Is the patient high risk? Yes, patient is taking a REMS drug. Medication is dispensed in compliance with REMS program    Does the patient require physician intervention or other additional services (i.e., nutrition, smoking cessation, social work)? No    Does the patient have an additional or emergency contact listed in their chart? Yes    SOCIAL DETERMINANTS OF HEALTH     At the [facility], we have learned that life circumstances - like trouble affording food, housing, utilities, or transportation can affect the health of many of our patients.   That is why we wanted to ask: are you currently experiencing any life circumstances that are negatively impacting your health and/or quality of life? Patient declined to answer    Social Drivers of Health     Food Insecurity: Not on file   Tobacco Use: Medium Risk (08/06/2023)  Patient History     Smoking Tobacco Use: Former     Smokeless Tobacco Use: Never     Passive Exposure: Not on file   Transportation Needs: Not on file   Alcohol Use: Not on file   Housing: Not on file   Physical Activity: Not on file   Utilities: Not on file   Stress: Not on file   Interpersonal Safety: Not on file   Substance Use: Not on file (06/07/2023)   Intimate Partner Violence: Not on file   Social Connections: Not on file   Financial Resource Strain: Not on file   Health Literacy: Not on file   Internet Connectivity: Not on file       Would you be willing to receive help with any of the needs that you have identified today? Not applicable       SHIPPING     Specialty Medication(s) to be Shipped:   N/a    Other medication(s) to be shipped: No additional medications requested for fill at this time     Changes to insurance: No    Cost and Payment:  n/a    Delivery Scheduled: Patient declined refill at this time due to supply on hand from outside pharmacy, he would like call back end of May to set up first delivery from [facility] in June. Call set up for end of May, patient has our phone number and is aware to contact [facility] with any needs sooner. Clinic aware.     Medication will be delivered via  n/a  to the confirmed  n/a  address in [facility].    The patient will receive a drug information handout for each medication shipped and additional FDA Medication Guides as required.  Verified that patient has previously received a Conservation officer, historic buildings and a Surveyor, mining.    The patient or caregiver noted above participated in the development of this care plan and knows that they can request review of or adjustments to the care plan at any time.      All of the patient's questions and concerns have been addressed.    Christine Cozier, PharmD   [facility] Delivery [facility] Specialty Pharmacist

## 2023-12-01 ENCOUNTER — Ambulatory Visit: Admitting: Nurse Practitioner

## 2023-12-01 ENCOUNTER — Encounter: Payer: Self-pay | Admitting: Nurse Practitioner

## 2023-12-01 VITALS — BP 120/60 | HR 97 | Temp 97.4°F | Ht 68.0 in | Wt 171.0 lb

## 2023-12-01 DIAGNOSIS — R140 Abdominal distension (gaseous): Secondary | ICD-10-CM

## 2023-12-01 DIAGNOSIS — H1131 Conjunctival hemorrhage, right eye: Secondary | ICD-10-CM | POA: Diagnosis not present

## 2023-12-01 NOTE — Progress Notes (Signed)
 Established Patient Office Visit  Subjective:  Patient ID: [name], male    DOB: Jan 16, 1956  Age: 68 y.o. MRN: [id number]  CC:  Chief Complaint  Patient presents with   Bloated   GI Problem   Discussed the use of a AI scribe software for clinical note transcription with the patient, who gave verbal consent to proceed.  HPI  [name] 68 year old male with a history of kidney transplant who presents with chronic gastrointestinal issues including alternating constipation and diarrhea.  For over a year, he has experienced gastrointestinal issues characterized by alternating episodes of constipation and diarrhea. He feels bloated regardless of food intake and experiences a sensation of fullness in his abdomen. Periods of constipation last two to three days, followed by a day of diarrhea, often starting early in the morning and lasting throughout the day. Symptoms include a sore and hard abdomen, but no pain, blood in the stool, fever, or vomiting.  He takes omeprazole for bloating and gas but does not regularly use any medication for constipation. Occasionally, he uses Miralax, approximately two to three times a week, to alleviate constipation. Bowel movements are normal when they occur, without excessive straining.  He identifies certain foods such as milk, cheese, and hot spices as potential triggers for his diarrhea. Previous tests did not reveal any abnormalities.  He also mentions a recent issue with his eye, which became red on Tuesday, but he does not experience any pain or vision problems.     Past Medical History:  Diagnosis Date   Aortic atherosclerosis (HCC)    Arthritis    BPH (benign prostatic hyperplasia)    Carotid arterial disease (HCC)    a.) s/p LEFT CEA on 09/06/2019   Coronary artery disease    a.) MI with stents x 2 in 2004; b.) MI with stent x 1 2005; c.) s/p CABG x 2 06/02/2006 (LIMA-LAD, SVG-PDA); d.) LHC/PCI 08/25/2007: 80% oD1 (2.5 x 12 mm  Xience V DES)   DDD (degenerative disc disease), lumbar    Dyspnea    ESRD (end stage renal disease) (HCC)    a.) s/p RIGHT renal 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/2015   GERD (gastroesophageal reflux disease)    History of blood transfusion    Hypertension    Long term current use of aspirin     Long term current use of immunosuppressive drug    a.) mycophenolate  + tacrolimus    Myocardial infarction [facility]) 2004   a.) details unclear; stents x 2 (unknown type/location)   Myocardial infarction (HCC) 2005   a.) details unclear; stent x 1 (unknown type/location)   On apixaban  therapy    PAF (paroxysmal atrial fibrillation) (HCC)    a.) CHA2DS2-VASc = 5 (age, HTN, vascular disease history/MI, T2DM) as of 06/10/2023; b.) cardiac rate/rhythm maintained intrinsically without pharmacological intervention; chronically anticoagulated using apixaban    Sepsis due to gram-negative UTI (HCC) 04/27/2020   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   From DM and HTN, f/u with [facility] every 6 months   T2DM (type 2 diabetes mellitus) (HCC)    Unstable angina (HCC)    Vitamin D deficiency     Past Surgical History:  Procedure Laterality Date   CATARACT EXTRACTION Bilateral    CATARACT EXTRACTION W/PHACO Right 02/03/2017   Procedure: CATARACT EXTRACTION PHACO AND INTRAOCULAR LENS PLACEMENT (IOC);  Surgeon: Annell Kidney, MD;  Location: [facility] ORS;  Service: Ophthalmology;  Laterality: Right;  US  00:35.8 AP% 12.8 CDE 4.57 Fluid lot # [id number]  COLONOSCOPY WITH PROPOFOL  N/A 12/30/2021   Procedure: COLONOSCOPY WITH PROPOFOL ;  Surgeon: Luke Salaam, MD;  Location: [facility] ENDOSCOPY;  Service: Gastroenterology;  Laterality: N/A;   CORONARY ANGIOPLASTY WITH STENT PLACEMENT Left 2004   stents x 2   CORONARY ANGIOPLASTY WITH STENT PLACEMENT Left 2005   stent x 1   CORONARY ANGIOPLASTY WITH STENT PLACEMENT Left 08/25/2007   Procedure: CORONARY ANGIOPLASTY WITH STENT PLACEMENT; Location: [facility]; Surgeon: Jama Mayotte, MD    CORONARY ARTERY BYPASS GRAFT N/A 06/08/2006   Procedure: CORONARY ARTERY BYPASS GRAFT; Location: [facility]; Surgeon: Dorethea Ganong, MD   CYSTOSCOPY WITH INSERTION OF UROLIFT N/A 06/13/2023   Procedure: CYSTOSCOPY WITH INSERTION OF UROLIFT;  Surgeon: Dustin Gimenez, MD;  Location: [facility] ORS;  Service: Urology;  Laterality: N/A;   ENDARTERECTOMY Left 09/06/2019   Procedure: ENDARTERECTOMY CAROTID;  Surgeon: Celso College, MD;  Location: [facility] ORS;  Service: Vascular;  Laterality: Left;   HIP FRACTURE SURGERY     KIDNEY TRANSPLANT Right 05/10/2015   LEFT HEART CATH AND CORONARY ANGIOGRAPHY Left 05/20/2006   Procedure: LEFT HEART CATH AND CORONARY ANGIOGRAPHY; Location: [facility]; Surgeon: Debborah Fairly, MD   LEFT HEART CATH AND CORONARY ANGIOGRAPHY Left 11/08/2023   Procedure: LEFT HEART CATH AND CORONARY ANGIOGRAPHY with possible intervention;  Surgeon: Cherrie Cornwall, MD;  Location: [facility] INVASIVE CV LAB;  Service: Cardiovascular;  Laterality: Left;   LEFT HEART CATH AND CORS/GRAFTS ANGIOGRAPHY Left 08/10/2007   Procedure: LEFT HEART CATH AND CORS/GRAFTS ANGIOGRAPHY; Location: [facility]; Surgeon: Debborah Fairly, MD   LEFT HEART CATH AND CORS/GRAFTS ANGIOGRAPHY Left 05/04/2011   Procedure: LEFT HEART CATH AND CORS/GRAFTS ANGIOGRAPHY; Location: [facility]; Surgeon: Debborah Fairly, MD    Family History  Problem Relation Age of Onset   Osteoporosis Mother    Drug abuse Sister    Drug abuse Brother     Social History   Socioeconomic History   Marital status: Married    Spouse name: Not on file   Number of children: Not on file   Years of education: Not on file   Highest education level: 5th grade  Occupational History   Not on file  Tobacco Use   Smoking status: Former   Smokeless tobacco: Never   Tobacco comments:    30 years ago  Vaping Use   Vaping status: Never Used  Substance and Sexual Activity   Alcohol use: No    Alcohol/week: 0.0 standard drinks of alcohol   Drug use: Never   Sexual activity: Not on  file  Other Topics Concern   Not on file  Social History Narrative   Not on file   Social Drivers of Health   Financial Resource Strain: Low Risk  (11/30/2023)   Overall Financial Resource Strain (CARDIA)    Difficulty of Paying Living Expenses: Not hard at all  Food Insecurity: No Food Insecurity (11/30/2023)   Hunger Vital Sign    Worried About Running Out of Food in the Last Year: Never true    Ran Out of Food in the Last Year: Never true  Transportation Needs: No Transportation Needs (11/30/2023)   PRAPARE - Administrator, Civil Service (Medical): No    Lack of Transportation (Non-Medical): No  Physical Activity: Sufficiently Active (11/30/2023)   Exercise Vital Sign    Days of Exercise per Week: 4 days    Minutes of Exercise per Session: 60 min  Stress: No Stress Concern Present (11/30/2023)   [employer] of Occupational Health - Occupational Stress Questionnaire  Feeling of Stress : Not at all  Social Connections: Moderately Integrated (11/30/2023)   Social Connection and Isolation Panel [NHANES]    Frequency of Communication with Friends and Family: More than three times a week    Frequency of Social Gatherings with Friends and Family: More than three times a week    Attends Religious Services: More than 4 times per year    Active Member of [employer] or Organizations: No    Attends Engineer, structural: Not on file    Marital Status: Married  Catering manager Violence: Not on file     Outpatient Medications Prior to Visit  Medication Sig Dispense Refill   amiodarone  (PACERONE ) 400 MG tablet Take 1 tablet (400 mg total) by mouth daily. 60 tablet 0   ascorbic acid (VITAMIN C) 500 MG tablet Take 500 mg by mouth daily.     aspirin  EC 81 MG EC tablet Take 1 tablet (81 mg total) by mouth daily at 6 (six) AM.     atorvastatin  (LIPITOR) 40 MG tablet Take 40 mg by mouth at bedtime.     CELLCEPT  250 MG capsule Take 500 mg by mouth 2 (two) times daily.      Cholecalciferol  (VITAMIN D) 50 MCG (2000 UT) CAPS Take 2,000 Units by mouth daily.     Continuous Blood Gluc Sensor (FREESTYLE LIBRE 2 SENSOR) MISC by Does not apply route.     ELIQUIS  5 MG TABS tablet Take 5 mg by mouth 2 (two) times daily.      finasteride  (PROSCAR ) 5 MG tablet TAKE 1 TABLET BY MOUTH ONCE DAILY. 90 tablet 0   HUMALOG KWIKPEN 100 UNIT/ML KiwkPen Inject 10-12 Units into the skin 3 (three) times daily. Inject 10 units daily at breakfast, 10 units at lunch and up to 10 units or less at supper per SSI     Insulin  Pen Needle 33G X 4 MM MISC To use with insulin  pen 4 times per day. E 13.9     LANTUS  SOLOSTAR 100 UNIT/ML Solostar Pen Inject 24 Units into the skin at bedtime.      linagliptin (TRADJENTA) 5 MG TABS tablet Take 5 mg by mouth daily.     losartan  (COZAAR ) 100 MG tablet Take 1 tablet (100 mg total) by mouth daily. 30 tablet 11   nitroGLYCERIN  (NITROSTAT ) 0.4 MG SL tablet Place 1 tablet (0.4 mg total) under the tongue every 5 (five) minutes as needed for chest pain. 100 tablet 3   omeprazole (PRILOSEC) 20 MG capsule Take 20 mg by mouth daily.     PROGRAF  1 MG capsule Take 1 mg by mouth every morning.     tacrolimus  (PROGRAF ) 0.5 MG capsule Take 0.5 mg by mouth at bedtime.     tamsulosin  (FLOMAX ) 0.4 MG CAPS capsule Take 0.8 mg by mouth daily.     Facility-Administered Medications Prior to Visit  Medication Dose Route Frequency Provider Last Rate Last Admin   sodium chloride  flush (NS) 0.9 % injection 3 mL  3 mL Intravenous Q12H Debborah Fairly A, MD        Allergies  Allergen Reactions   Oxybutynin  Other (See Comments)    Incomplete bladder emptying    ROS Review of Systems Negative unless indicated in HPI.    Objective:    Physical Exam Constitutional:      Appearance: Normal appearance.  Eyes:     Conjunctiva/sclera:     Right eye: Hemorrhage present.  Cardiovascular:     Rate and  Rhythm: Normal rate and regular rhythm.     Pulses: Normal pulses.      Heart sounds: Normal heart sounds.  Pulmonary:     Effort: Pulmonary effort is normal.     Breath sounds: Normal breath sounds.  Abdominal:     General: Bowel sounds are normal.     Palpations: Abdomen is soft.     Tenderness: There is no abdominal tenderness. There is no right CVA tenderness or left CVA tenderness.  Musculoskeletal:     Cervical back: Normal range of motion.  Neurological:     General: No focal deficit present.     Mental Status: He is alert. Mental status is at baseline.  Psychiatric:        Mood and Affect: Mood normal.        Behavior: Behavior normal.        Thought Content: Thought content normal.        Judgment: Judgment normal.     BP 120/60   Pulse 97   Temp (!) 97.4 F (36.3 C) (Oral)   Ht 5\' 8"  (1.727 m)   Wt 171 lb (77.6 kg)   SpO2 96%   BMI 26.00 kg/m  Wt Readings from Last 3 Encounters:  12/01/23 171 lb (77.6 kg)  11/22/23 171 lb 3.2 oz (77.7 kg)  11/14/23 172 lb 3.2 oz (78.1 kg)     Health Maintenance  Topic Date Due   Medicare Annual Wellness (AWV)  Never done   OPHTHALMOLOGY EXAM  Never done   Hepatitis C Screening  Never done   FOOT EXAM  06/16/2023   COVID-19 Vaccine (8 - Moderna risk 2024-25 season) 11/23/2023   HEMOGLOBIN A1C  12/07/2023   INFLUENZA VACCINE  03/02/2024   Colonoscopy  12/31/2031   DTaP/Tdap/Td (3 - Td or Tdap) 11/24/2033   Pneumonia Vaccine 49+ Years old  Completed   Zoster Vaccines- Shingrix  Completed   HPV VACCINES  Aged Out   Meningococcal B Vaccine  Aged Out    There are no preventive care reminders to display for this patient.  No results found for: "TSH" Lab Results  Component Value Date   WBC 6.5 11/03/2023   HGB 14.9 11/03/2023   HCT 47.0 11/03/2023   MCV 94 11/03/2023   PLT 157 11/03/2023   Lab Results  Component Value Date   NA 138 11/11/2023   K 4.5 11/11/2023   CO2 19 (L) 11/11/2023   GLUCOSE 115 (H) 11/11/2023   BUN 22 11/11/2023   CREATININE 1.20 11/11/2023   BILITOT 0.7  11/11/2023   ALKPHOS 54 11/11/2023   AST 19 11/11/2023   ALT 14 11/11/2023   PROT 6.8 11/11/2023   ALBUMIN 4.1 11/11/2023   CALCIUM  9.2 11/11/2023   ANIONGAP 11 05/22/2020   EGFR 65 11/11/2023   Lab Results  Component Value Date   CHOL  10/20/2010    94        ATP III CLASSIFICATION:  <200     mg/dL   Desirable  045-409  mg/dL   Borderline High  >=811    mg/dL   High          No results found for: "HDL" No results found for: "LDLCALC" Lab Results  Component Value Date   TRIG 154 (H) 10/23/2010   No results found for: "CHOLHDL" Lab Results  Component Value Date   HGBA1C 7 06/09/2023      Assessment & Plan:  Abdominal bloating Assessment & Plan: Chronic constipation  with intermittent diarrhea, exacerbated by dairy and spices. - Advise Miralax every other day. - Recommend food diary to identify triggers. - Increase water  intake and dietary fiber. - Follow-up in one month. - Consider specialist referral if symptoms persist.  Orders: -     Celiac Disease Panel  Subconjunctival hemorrhage of right eye Assessment & Plan: Subconjunctival hemorrhage of right eye.  Denies visual changes, eye pain or discharge. -Patient is scheduled to see eye doctor this afternoon.     Follow-up: Return in about 3 weeks (around 12/22/2023) for Bloating with either PCP or me..   Devony Mcgrady, NP

## 2023-12-01 NOTE — Patient Instructions (Addendum)
 This condition involves periods of constipation followed by diarrhea, often triggered by certain foods like dairy and spices. To manage this, you should take Miralax every other day, keep a food diary to identify triggers, increase your water  intake, and add more dietary fiber to your diet. We will follow up in one month.  Please go the [facility] today at 1:40 for Subconjunctival hemorrhage

## 2023-12-03 DIAGNOSIS — E119 Type 2 diabetes mellitus without complications: Secondary | ICD-10-CM | POA: Diagnosis not present

## 2023-12-03 LAB — CELIAC DISEASE PANEL
(tTG) Ab, IgA: <1 U/mL
(tTG) Ab, IgG: <1 U/mL
Gliadin IgA: 1.1 U/mL
Gliadin IgG: <1 U/mL
Immunoglobulin A: 470 mg/dL — ABNORMAL HIGH (ref 70–320)

## 2023-12-05 DIAGNOSIS — H1131 Conjunctival hemorrhage, right eye: Secondary | ICD-10-CM | POA: Diagnosis not present

## 2023-12-13 NOTE — Unmapped (Signed)
 [employer] 12/13/2023 Request for Additional INFO   Sent to HIM/Email

## 2023-12-15 DIAGNOSIS — H1131 Conjunctival hemorrhage, right eye: Secondary | ICD-10-CM | POA: Insufficient documentation

## 2023-12-15 DIAGNOSIS — E1159 Type 2 diabetes mellitus with other circulatory complications: Secondary | ICD-10-CM | POA: Diagnosis not present

## 2023-12-15 DIAGNOSIS — R140 Abdominal distension (gaseous): Secondary | ICD-10-CM | POA: Insufficient documentation

## 2023-12-15 HISTORY — DX: Conjunctival hemorrhage, right eye: H11.31

## 2023-12-15 NOTE — Assessment & Plan Note (Addendum)
 Subconjunctival hemorrhage of right eye.  Denies visual changes, eye pain or discharge. -Patient is scheduled to see eye doctor this afternoon.

## 2023-12-15 NOTE — Assessment & Plan Note (Signed)
 Chronic constipation with intermittent diarrhea, exacerbated by dairy and spices. - Advise Miralax every other day. - Recommend food diary to identify triggers. - Increase water  intake and dietary fiber. - Follow-up in one month. - Consider specialist referral if symptoms persist.

## 2023-12-16 ENCOUNTER — Ambulatory Visit (INDEPENDENT_AMBULATORY_CARE_PROVIDER_SITE_OTHER)

## 2023-12-16 ENCOUNTER — Inpatient Hospital Stay
Admission: EM | Admit: 2023-12-16 | Discharge: 2023-12-18 | DRG: 291 | Disposition: A | Discharge status: Home / Self Care | Attending: Family Medicine | Admitting: Family Medicine

## 2023-12-16 ENCOUNTER — Other Ambulatory Visit: Payer: Self-pay

## 2023-12-16 ENCOUNTER — Emergency Department

## 2023-12-16 VITALS — BP 130/60 | HR 50 | Temp 98.2°F | Ht 68.0 in | Wt 179.6 lb

## 2023-12-16 DIAGNOSIS — I2510 Atherosclerotic heart disease of native coronary artery without angina pectoris: Secondary | ICD-10-CM | POA: Diagnosis present

## 2023-12-16 DIAGNOSIS — N186 End stage renal disease: Secondary | ICD-10-CM | POA: Diagnosis not present

## 2023-12-16 DIAGNOSIS — I5023 Acute on chronic systolic (congestive) heart failure: Secondary | ICD-10-CM | POA: Diagnosis present

## 2023-12-16 DIAGNOSIS — E1165 Type 2 diabetes mellitus with hyperglycemia: Secondary | ICD-10-CM | POA: Diagnosis present

## 2023-12-16 DIAGNOSIS — I132 Hypertensive heart and chronic kidney disease with heart failure and with stage 5 chronic kidney disease, or end stage renal disease: Secondary | ICD-10-CM | POA: Diagnosis not present

## 2023-12-16 DIAGNOSIS — T8619 Other complication of kidney transplant: Secondary | ICD-10-CM | POA: Diagnosis not present

## 2023-12-16 DIAGNOSIS — I130 Hypertensive heart and chronic kidney disease with heart failure and stage 1 through stage 4 chronic kidney disease, or unspecified chronic kidney disease: Principal | ICD-10-CM | POA: Diagnosis present

## 2023-12-16 DIAGNOSIS — Z79899 Other long term (current) drug therapy: Secondary | ICD-10-CM | POA: Diagnosis not present

## 2023-12-16 DIAGNOSIS — R001 Bradycardia, unspecified: Secondary | ICD-10-CM | POA: Diagnosis present

## 2023-12-16 DIAGNOSIS — D84821 Immunodeficiency due to drugs: Secondary | ICD-10-CM | POA: Diagnosis not present

## 2023-12-16 DIAGNOSIS — Y830 Surgical operation with transplant of whole organ as the cause of abnormal reaction of the patient, or of later complication, without mention of misadventure at the time of the procedure: Secondary | ICD-10-CM | POA: Diagnosis not present

## 2023-12-16 DIAGNOSIS — Z951 Presence of aortocoronary bypass graft: Secondary | ICD-10-CM

## 2023-12-16 DIAGNOSIS — E1122 Type 2 diabetes mellitus with diabetic chronic kidney disease: Secondary | ICD-10-CM | POA: Diagnosis not present

## 2023-12-16 DIAGNOSIS — T502X5A Adverse effect of carbonic-anhydrase inhibitors, benzothiadiazides and other diuretics, initial encounter: Secondary | ICD-10-CM | POA: Diagnosis present

## 2023-12-16 DIAGNOSIS — G4733 Obstructive sleep apnea (adult) (pediatric): Secondary | ICD-10-CM | POA: Diagnosis not present

## 2023-12-16 DIAGNOSIS — I5022 Chronic systolic (congestive) heart failure: Secondary | ICD-10-CM

## 2023-12-16 DIAGNOSIS — N401 Enlarged prostate with lower urinary tract symptoms: Secondary | ICD-10-CM | POA: Diagnosis not present

## 2023-12-16 DIAGNOSIS — Z992 Dependence on renal dialysis: Secondary | ICD-10-CM | POA: Diagnosis not present

## 2023-12-16 DIAGNOSIS — Z79624 Long term (current) use of inhibitors of nucleotide synthesis: Secondary | ICD-10-CM

## 2023-12-16 DIAGNOSIS — N179 Acute kidney failure, unspecified: Secondary | ICD-10-CM | POA: Diagnosis not present

## 2023-12-16 DIAGNOSIS — I255 Ischemic cardiomyopathy: Secondary | ICD-10-CM | POA: Diagnosis present

## 2023-12-16 DIAGNOSIS — I509 Heart failure, unspecified: Secondary | ICD-10-CM

## 2023-12-16 DIAGNOSIS — N1831 Chronic kidney disease, stage 3a: Secondary | ICD-10-CM | POA: Diagnosis present

## 2023-12-16 DIAGNOSIS — R609 Edema, unspecified: Secondary | ICD-10-CM | POA: Diagnosis not present

## 2023-12-16 DIAGNOSIS — Z7984 Long term (current) use of oral hypoglycemic drugs: Secondary | ICD-10-CM

## 2023-12-16 DIAGNOSIS — R0602 Shortness of breath: Secondary | ICD-10-CM | POA: Diagnosis not present

## 2023-12-16 DIAGNOSIS — Z7982 Long term (current) use of aspirin: Secondary | ICD-10-CM

## 2023-12-16 DIAGNOSIS — I252 Old myocardial infarction: Secondary | ICD-10-CM

## 2023-12-16 DIAGNOSIS — E785 Hyperlipidemia, unspecified: Secondary | ICD-10-CM | POA: Diagnosis not present

## 2023-12-16 DIAGNOSIS — Z794 Long term (current) use of insulin: Secondary | ICD-10-CM

## 2023-12-16 DIAGNOSIS — Z792 Long term (current) use of antibiotics: Secondary | ICD-10-CM

## 2023-12-16 DIAGNOSIS — J929 Pleural plaque without asbestos: Secondary | ICD-10-CM | POA: Diagnosis not present

## 2023-12-16 DIAGNOSIS — I700 Atherosclerosis of aorta: Secondary | ICD-10-CM | POA: Diagnosis not present

## 2023-12-16 DIAGNOSIS — R0601 Orthopnea: Secondary | ICD-10-CM | POA: Diagnosis not present

## 2023-12-16 DIAGNOSIS — K219 Gastro-esophageal reflux disease without esophagitis: Secondary | ICD-10-CM | POA: Diagnosis not present

## 2023-12-16 DIAGNOSIS — I129 Hypertensive chronic kidney disease with stage 1 through stage 4 chronic kidney disease, or unspecified chronic kidney disease: Principal | ICD-10-CM | POA: Diagnosis present

## 2023-12-16 DIAGNOSIS — M51369 Other intervertebral disc degeneration, lumbar region without mention of lumbar back pain or lower extremity pain: Secondary | ICD-10-CM | POA: Diagnosis present

## 2023-12-16 DIAGNOSIS — I480 Paroxysmal atrial fibrillation: Secondary | ICD-10-CM | POA: Diagnosis not present

## 2023-12-16 DIAGNOSIS — I5043 Acute on chronic combined systolic (congestive) and diastolic (congestive) heart failure: Secondary | ICD-10-CM | POA: Diagnosis not present

## 2023-12-16 DIAGNOSIS — R918 Other nonspecific abnormal finding of lung field: Secondary | ICD-10-CM | POA: Diagnosis not present

## 2023-12-16 DIAGNOSIS — Z955 Presence of coronary angioplasty implant and graft: Secondary | ICD-10-CM

## 2023-12-16 DIAGNOSIS — Z555 Less than a high school diploma: Secondary | ICD-10-CM

## 2023-12-16 DIAGNOSIS — Z961 Presence of intraocular lens: Secondary | ICD-10-CM | POA: Diagnosis present

## 2023-12-16 DIAGNOSIS — Z7901 Long term (current) use of anticoagulants: Secondary | ICD-10-CM | POA: Diagnosis not present

## 2023-12-16 DIAGNOSIS — R0989 Other specified symptoms and signs involving the circulatory and respiratory systems: Secondary | ICD-10-CM | POA: Diagnosis not present

## 2023-12-16 DIAGNOSIS — Z940 Kidney transplant status: Secondary | ICD-10-CM | POA: Diagnosis not present

## 2023-12-16 DIAGNOSIS — Z87891 Personal history of nicotine dependence: Secondary | ICD-10-CM

## 2023-12-16 DIAGNOSIS — Z888 Allergy status to other drugs, medicaments and biological substances status: Secondary | ICD-10-CM

## 2023-12-16 HISTORY — DX: Acute kidney failure, unspecified: N17.9

## 2023-12-16 HISTORY — DX: Heart failure, unspecified: I50.9

## 2023-12-16 HISTORY — DX: Orthopnea: R06.01

## 2023-12-16 LAB — BRAIN NATRIURETIC PEPTIDE: B Natriuretic Peptide: 684.4 pg/mL — ABNORMAL HIGH (ref 0.0–100.0)

## 2023-12-16 LAB — CBG MONITORING, ED
Glucose-Capillary: 312 mg/dL — ABNORMAL HIGH (ref 70–99)
Glucose-Capillary: 156 mg/dL — ABNORMAL HIGH (ref 70–99)
Glucose-Capillary: 98 mg/dL (ref 70–99)

## 2023-12-16 LAB — BASIC METABOLIC PANEL WITH GFR
Anion gap: 10 (ref 5–15)
BUN: 26 mg/dL — ABNORMAL HIGH (ref 8–23)
CO2: 22 mmol/L (ref 22–32)
Calcium: 8.3 mg/dL — ABNORMAL LOW (ref 8.9–10.3)
Chloride: 104 mmol/L (ref 98–111)
Creatinine, Ser: 1.55 mg/dL — ABNORMAL HIGH (ref 0.61–1.24)
GFR, Estimated: 48 mL/min — ABNORMAL LOW (ref >60)
Glucose, Bld: 114 mg/dL — ABNORMAL HIGH (ref 70–99)
Potassium: 4.2 mmol/L (ref 3.5–5.1)
Sodium: 136 mmol/L (ref 135–145)

## 2023-12-16 LAB — TROPONIN I (HIGH SENSITIVITY)
Troponin I (High Sensitivity): 18 ng/L — ABNORMAL HIGH (ref <18)
Troponin I (High Sensitivity): 19 ng/L — ABNORMAL HIGH (ref <18)

## 2023-12-16 LAB — CBC
HCT: 44.1 % (ref 39.0–52.0)
Hemoglobin: 14.2 g/dL (ref 13.0–17.0)
MCH: 30.3 pg (ref 26.0–34.0)
MCHC: 32.2 g/dL (ref 30.0–36.0)
MCV: 94 fL (ref 80.0–100.0)
Platelets: 148 10*3/uL — ABNORMAL LOW (ref 150–400)
RBC: 4.69 MIL/uL (ref 4.22–5.81)
RDW: 13.5 % (ref 11.5–15.5)
WBC: 7.1 10*3/uL (ref 4.0–10.5)
nRBC: 0 % (ref 0.0–0.2)

## 2023-12-16 LAB — MAGNESIUM: Magnesium: 2 mg/dL (ref 1.7–2.4)

## 2023-12-16 LAB — HEMOGLOBIN A1C
Hgb A1c MFr Bld: 6.6 % — ABNORMAL HIGH (ref 4.8–5.6)
Mean Plasma Glucose: 142.72 mg/dL

## 2023-12-16 LAB — TSH: TSH: 2.987 u[IU]/mL (ref 0.350–4.500)

## 2023-12-16 MED ORDER — FINASTERIDE 5 MG PO TABS
5.0000 mg | ORAL_TABLET | Freq: Every day | ORAL | Status: DC
  Administered 2023-12-17 – 2023-12-18 (×2): 5 mg via ORAL
  Filled 2023-12-16 (×2): qty 1

## 2023-12-16 MED ORDER — TACROLIMUS 1 MG PO CAPS
1.0000 mg | ORAL_CAPSULE | ORAL | Status: DC
  Administered 2023-12-17 – 2023-12-18 (×2): 1 mg via ORAL
  Filled 2023-12-16 (×2): qty 1

## 2023-12-16 MED ORDER — MYCOPHENOLATE MOFETIL 250 MG PO CAPS
500.0000 mg | ORAL_CAPSULE | Freq: Two times a day (BID) | ORAL | Status: DC
  Administered 2023-12-16 – 2023-12-18 (×4): 500 mg via ORAL
  Filled 2023-12-16 (×6): qty 2

## 2023-12-16 MED ORDER — TAMSULOSIN HCL 0.4 MG PO CAPS
0.8000 mg | ORAL_CAPSULE | Freq: Every evening | ORAL | Status: DC
  Administered 2023-12-16 – 2023-12-17 (×2): 0.8 mg via ORAL
  Filled 2023-12-16 (×2): qty 2

## 2023-12-16 MED ORDER — HYDRALAZINE HCL 20 MG/ML IJ SOLN: 10.0000 mg | Freq: Four times a day (QID) | INTRAMUSCULAR | Status: DC | PRN

## 2023-12-16 MED ORDER — LOSARTAN POTASSIUM 50 MG PO TABS: 100.0000 mg | ORAL_TABLET | Freq: Every day | ORAL | Status: DC

## 2023-12-16 MED ORDER — MUSCLE RUB 10-15 % EX CREA: 1.0000 | TOPICAL_CREAM | CUTANEOUS | Status: DC | PRN

## 2023-12-16 MED ORDER — PANTOPRAZOLE SODIUM 40 MG PO TBEC
40.0000 mg | DELAYED_RELEASE_TABLET | Freq: Every day | ORAL | Status: DC
  Administered 2023-12-17 – 2023-12-18 (×2): 40 mg via ORAL
  Filled 2023-12-16 (×2): qty 1

## 2023-12-16 MED ORDER — SALINE SPRAY 0.65 % NA SOLN: 1.0000 | NASAL | Status: DC | PRN

## 2023-12-16 MED ORDER — ACETAMINOPHEN 325 MG PO TABS: 650.0000 mg | ORAL_TABLET | Freq: Four times a day (QID) | ORAL | Status: DC | PRN

## 2023-12-16 MED ORDER — FUROSEMIDE 10 MG/ML IJ SOLN
40.0000 mg | Freq: Once | INTRAMUSCULAR | Status: AC
  Administered 2023-12-16: 40 mg via INTRAVENOUS
  Filled 2023-12-16: qty 4

## 2023-12-16 MED ORDER — ASPIRIN 81 MG PO TBEC
81.0000 mg | DELAYED_RELEASE_TABLET | Freq: Every day | ORAL | Status: DC
  Administered 2023-12-17 – 2023-12-18 (×2): 81 mg via ORAL
  Filled 2023-12-16 (×3): qty 1

## 2023-12-16 MED ORDER — ATORVASTATIN CALCIUM 20 MG PO TABS
40.0000 mg | ORAL_TABLET | Freq: Every day | ORAL | Status: DC
  Administered 2023-12-16 – 2023-12-17 (×2): 40 mg via ORAL
  Filled 2023-12-16 (×3): qty 2

## 2023-12-16 MED ORDER — APIXABAN 5 MG PO TABS
5.0000 mg | ORAL_TABLET | Freq: Two times a day (BID) | ORAL | Status: DC
  Administered 2023-12-16 – 2023-12-18 (×4): 5 mg via ORAL
  Filled 2023-12-16 (×5): qty 1

## 2023-12-16 MED ORDER — INSULIN ASPART 100 UNIT/ML IJ SOLN
0.0000 [IU] | Freq: Three times a day (TID) | INTRAMUSCULAR | Status: DC
  Administered 2023-12-16: 7 [IU] via SUBCUTANEOUS
  Administered 2023-12-17: 2 [IU] via SUBCUTANEOUS
  Administered 2023-12-17: 1 [IU] via SUBCUTANEOUS
  Administered 2023-12-17: 3 [IU] via SUBCUTANEOUS
  Filled 2023-12-16 (×4): qty 1

## 2023-12-16 MED ORDER — HEPARIN SODIUM (PORCINE) 5000 UNIT/ML IJ SOLN: 5000.0000 [IU] | Freq: Three times a day (TID) | INTRAMUSCULAR | Status: DC

## 2023-12-16 MED ORDER — FUROSEMIDE 10 MG/ML IJ SOLN
40.0000 mg | Freq: Two times a day (BID) | INTRAMUSCULAR | Status: DC
  Administered 2023-12-16 – 2023-12-18 (×4): 40 mg via INTRAVENOUS
  Filled 2023-12-16 (×4): qty 4

## 2023-12-16 MED ORDER — AMIODARONE HCL 200 MG PO TABS
400.0000 mg | ORAL_TABLET | Freq: Every day | ORAL | Status: DC
  Administered 2023-12-17: 400 mg via ORAL
  Filled 2023-12-16: qty 2

## 2023-12-16 MED ORDER — TACROLIMUS 0.5 MG PO CAPS
0.5000 mg | ORAL_CAPSULE | Freq: Every day | ORAL | Status: DC
  Administered 2023-12-16 – 2023-12-17 (×2): 0.5 mg via ORAL
  Filled 2023-12-16 (×4): qty 1

## 2023-12-16 MED ORDER — NITROGLYCERIN 0.4 MG SL SUBL: 0.4000 mg | SUBLINGUAL_TABLET | SUBLINGUAL | Status: DC | PRN

## 2023-12-16 MED ORDER — LABETALOL HCL 5 MG/ML IV SOLN: 10.0000 mg | INTRAVENOUS | Status: DC | PRN

## 2023-12-16 NOTE — Assessment & Plan Note (Signed)
 Plan per CHF exacerbation

## 2023-12-16 NOTE — ED Triage Notes (Addendum)
 Pt comes with sob for about two weeks. Pt has noticed weight gain. Pt sent over by pcp for evaluation of CHF exacerbation. Pt has labored breathing noted and appears winded with exertion. Pt is on thinner. Pt has bruise noted under right eye and blood noted in eye. Pt states his pcp is aware and it is a busted blood vessel.

## 2023-12-16 NOTE — ED Notes (Addendum)
 Called [facility] for central monitoring

## 2023-12-16 NOTE — H&P (Signed)
 History and Physical    Patient: [name] MVH:[id number] DOB: 11-20-1955 DOA: 12/16/2023 DOS: the patient was seen and examined on 12/16/2023 PCP: Jacklin Mascot, MD  Patient coming from: Home  Chief Complaint:  Chief Complaint  Patient presents with   Shortness of Breath   HPI: [name] is a 68 y.o. male with medical history significant of aortic atherosclerosis, arthritis, BPH, carotid artery disease, CAD with prior CABG, degenerative disc disease, CKD status post renal transplant, A-fib, diabetes, OSA on CPAP, hypertension, heart failure with reduced EF. Patient presented to his PCP this morning complaining of weight gain, abdominal bloating, orthopnea.  All symptoms have been worsening over the last 2 weeks.  Patient reports his orthopnea has become so severe he was unable to use his CPAP at home.  He also endorses a 10 pound weight gain in the last 2 weeks with decreased urinary output.  He denies any chest pain, nausea/vomiting, recent illness, fever.  He has been taking all medications as prescribed He follows with outpatient cardiology Dr. Debborah Fairly.  He underwent cardiac cath on 11/08/2023: EF 45% at that time, LIMA to LAD and SVG to PDA patent. Patient reports he has previously been on diuretics but does not take any now and has not been prescribed any in a long time.  He believes he holds fluid in his abdomen, and reports he does not typically have swelling in his legs.  In the ED he was found to have creatinine elevated above baseline.  Chest x-ray reveals vascular congestion with likely pleural effusion.  BNP 684.  Patient received first dose of IV Lasix  and was admitted for further management.  At the time of my evaluation he denies any chest pain, endorses some shortness of breath and generalized swelling.   Review of Systems: As mentioned in the history of present illness. All other systems reviewed and are negative. Past Medical History:  Diagnosis Date    Aortic atherosclerosis (HCC)    Arthritis    BPH (benign prostatic hyperplasia)    Carotid arterial disease (HCC)    a.) s/p LEFT CEA on 09/06/2019   Coronary artery disease    a.) MI with stents x 2 in 2004; b.) MI with stent x 1 2005; c.) s/p CABG x 2 06/02/2006 (LIMA-LAD, SVG-PDA); d.) LHC/PCI 08/25/2007: 80% oD1 (2.5 x 12 mm Xience V DES)   DDD (degenerative disc disease), lumbar    Dyspnea    ESRD (end stage renal disease) (HCC)    a.) s/p RIGHT renal 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/2015   GERD (gastroesophageal reflux disease)    History of blood transfusion    Hypertension    Long term current use of aspirin     Long term current use of immunosuppressive drug    a.) mycophenolate  + tacrolimus    Myocardial infarction [facility]) 2004   a.) details unclear; stents x 2 (unknown type/location)   Myocardial infarction (HCC) 2005   a.) details unclear; stent x 1 (unknown type/location)   On apixaban  therapy    PAF (paroxysmal atrial fibrillation) (HCC)    a.) CHA2DS2-VASc = 5 (age, HTN, vascular disease history/MI, T2DM) as of 06/10/2023; b.) cardiac rate/rhythm maintained intrinsically without pharmacological intervention; chronically anticoagulated using apixaban    Sepsis due to gram-negative UTI (HCC) 04/27/2020   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   From DM and HTN, f/u with [facility] every 6 months   T2DM (type 2 diabetes mellitus) (HCC)    Unstable angina (HCC)    Vitamin  D deficiency    Past Surgical History:  Procedure Laterality Date   CATARACT EXTRACTION Bilateral    CATARACT EXTRACTION W/PHACO Right 02/03/2017   Procedure: CATARACT EXTRACTION PHACO AND INTRAOCULAR LENS PLACEMENT (IOC);  Surgeon: Annell Kidney, MD;  Location: [facility] ORS;  Service: Ophthalmology;  Laterality: Right;  US  00:35.8 AP% 12.8 CDE 4.57 Fluid lot # [id number]   COLONOSCOPY WITH PROPOFOL  N/A 12/30/2021   Procedure: COLONOSCOPY WITH PROPOFOL ;  Surgeon: Luke Salaam, MD;  Location: [facility] ENDOSCOPY;  Service:  Gastroenterology;  Laterality: N/A;   CORONARY ANGIOPLASTY WITH STENT PLACEMENT Left 2004   stents x 2   CORONARY ANGIOPLASTY WITH STENT PLACEMENT Left 2005   stent x 1   CORONARY ANGIOPLASTY WITH STENT PLACEMENT Left 08/25/2007   Procedure: CORONARY ANGIOPLASTY WITH STENT PLACEMENT; Location: [facility]; Surgeon: Jama Mayotte, MD   CORONARY ARTERY BYPASS GRAFT N/A 06/08/2006   Procedure: CORONARY ARTERY BYPASS GRAFT; Location: [facility]; Surgeon: Dorethea Ganong, MD   CYSTOSCOPY WITH INSERTION OF UROLIFT N/A 06/13/2023   Procedure: CYSTOSCOPY WITH INSERTION OF UROLIFT;  Surgeon: Dustin Gimenez, MD;  Location: [facility] ORS;  Service: Urology;  Laterality: N/A;   ENDARTERECTOMY Left 09/06/2019   Procedure: ENDARTERECTOMY CAROTID;  Surgeon: Celso College, MD;  Location: [facility] ORS;  Service: Vascular;  Laterality: Left;   HIP FRACTURE SURGERY     KIDNEY TRANSPLANT Right 05/10/2015   LEFT HEART CATH AND CORONARY ANGIOGRAPHY Left 05/20/2006   Procedure: LEFT HEART CATH AND CORONARY ANGIOGRAPHY; Location: [facility]; Surgeon: Debborah Fairly, MD   LEFT HEART CATH AND CORONARY ANGIOGRAPHY Left 11/08/2023   Procedure: LEFT HEART CATH AND CORONARY ANGIOGRAPHY with possible intervention;  Surgeon: Cherrie Cornwall, MD;  Location: [facility] INVASIVE CV LAB;  Service: Cardiovascular;  Laterality: Left;   LEFT HEART CATH AND CORS/GRAFTS ANGIOGRAPHY Left 08/10/2007   Procedure: LEFT HEART CATH AND CORS/GRAFTS ANGIOGRAPHY; Location: [facility]; Surgeon: Debborah Fairly, MD   LEFT HEART CATH AND CORS/GRAFTS ANGIOGRAPHY Left 05/04/2011   Procedure: LEFT HEART CATH AND CORS/GRAFTS ANGIOGRAPHY; Location: [facility]; Surgeon: Debborah Fairly, MD   Social History:  reports that he has quit smoking. He has never used smokeless tobacco. He reports that he does not drink alcohol and does not use drugs.  Allergies  Allergen Reactions   Oxybutynin  Other (See Comments)    Incomplete bladder emptying    Family History  Problem Relation Age of Onset    Osteoporosis Mother    Drug abuse Sister    Drug abuse Brother     Prior to Admission medications   Medication Sig Start Date End Date Taking? Authorizing Provider  amiodarone  (PACERONE ) 400 MG tablet Take 1 tablet (400 mg total) by mouth daily. 11/03/23   Cherrie Cornwall, MD  ascorbic acid (VITAMIN C) 500 MG tablet Take 500 mg by mouth daily.    [provider]  aspirin  EC 81 MG EC tablet Take 1 tablet (81 mg total) by mouth daily at 6 (six) AM. 09/08/19   Stegmayer, Irven Manson, PA-C  atorvastatin  (LIPITOR) 40 MG tablet Take 40 mg by mouth at bedtime. 12/23/14   [provider]  CELLCEPT  250 MG capsule Take 500 mg by mouth 2 (two) times daily. 08/12/21   [provider]  Cholecalciferol  (VITAMIN D) 50 MCG (2000 UT) CAPS Take 2,000 Units by mouth daily.    [provider]  Continuous Blood Gluc Sensor (FREESTYLE LIBRE 2 SENSOR) MISC by Does not apply route.    [provider]  ELIQUIS  5 MG TABS tablet Take  5 mg by mouth 2 (two) times daily.  04/21/18   [provider]  finasteride  (PROSCAR ) 5 MG tablet TAKE 1 TABLET BY MOUTH ONCE DAILY. 11/12/21   Dustin Gimenez, MD  HUMALOG KWIKPEN 100 UNIT/ML KiwkPen Inject 10-12 Units into the skin 3 (three) times daily. Inject 10 units daily at breakfast, 10 units at lunch and up to 10 units or less at supper per SSI    [provider]  Insulin  Pen Needle 33G X 4 MM MISC To use with insulin  pen 4 times per day. E 13.9 02/19/16   [provider]  LANTUS  SOLOSTAR 100 UNIT/ML Solostar Pen Inject 24 Units into the skin at bedtime.  01/18/15   [provider]  linagliptin (TRADJENTA) 5 MG TABS tablet Take 5 mg by mouth daily. 11/23/19   [provider]  losartan  (COZAAR ) 100 MG tablet Take 1 tablet (100 mg total) by mouth daily. 11/11/23 11/10/24  Cherrie Cornwall, MD  nitroGLYCERIN  (NITROSTAT ) 0.4 MG SL tablet Place 1 tablet (0.4 mg total) under the tongue every 5 (five) minutes as  needed for chest pain. 11/11/23 11/10/24  Cherrie Cornwall, MD  omeprazole (PRILOSEC) 20 MG capsule Take 20 mg by mouth daily.    [provider]  PROGRAF  1 MG capsule Take 1 mg by mouth every morning. 08/11/16   [provider]  tacrolimus  (PROGRAF ) 0.5 MG capsule Take 0.5 mg by mouth at bedtime. 09/08/23   [provider]  tamsulosin  (FLOMAX ) 0.4 MG CAPS capsule Take 0.8 mg by mouth daily.    [provider]    Physical Exam: Vitals:   12/16/23 1130 12/16/23 1140 12/16/23 1230 12/16/23 1300  BP:  (!) 151/50 (!) 167/57 (!) 172/66  Pulse: (!) 46 (!) 45 (!) 44 (!) 48  Resp: 15 17 18 14   Temp:      SpO2: 95% 96% 93% 96%  Weight:      Height:       Constitutional:  Normal appearance. Non toxic-appearing.  HENT: Head Normocephalic and atraumatic.  Mucous membranes are moist.  Eyes:  Extraocular intact. Conjunctivae normal. Pupils are equal, round, and reactive to light.  Cardiovascular: Rate and Rhythm: Normal rate and regular rhythm.  Pulmonary: Non labored, symmetric rise of chest wall.  Musculoskeletal:  Normal range of motion.  2+ pedal edema bilaterally. Skin: warm and dry. not jaundiced.  Neurological: No focal deficit present. alert. Oriented. Psychiatric: Mood and Affect congruent.   Data Reviewed:  CXR reviewed    Latest Ref Rng & Units 12/16/2023   10:10 AM 11/03/2023   10:29 AM 05/22/2020    5:15 AM  CBC  WBC 4.0 - 10.5 K/uL 7.1  6.5  6.5   Hemoglobin 13.0 - 17.0 g/dL 69.6  29.5  28.4   Hematocrit 39.0 - 52.0 % 44.1  47.0  35.5   Platelets 150 - 400 K/uL 148  157  169       Latest Ref Rng & Units 12/16/2023   10:10 AM 11/11/2023   12:09 PM 11/03/2023   10:25 AM  BMP  Glucose 70 - 99 mg/dL 132  440  102   BUN 8 - 23 mg/dL 26  22  28    Creatinine 0.61 - 1.24 mg/dL 7.25  3.66  4.40   BUN/Creat Ratio 10 - 24  18  22    Sodium 135 - 145 mmol/L 136  138  138   Potassium 3.5 - 5.1 mmol/L 4.2  4.5  5.0  Chloride 98 - 111 mmol/L 104  103  102    CO2 22 - 32 mmol/L 22  19  20    Calcium  8.9 - 10.3 mg/dL 8.3  9.2  9.0      Assessment and Plan: Acute heart failure exacerbation CAD s/p  CABG 2007 -cardiac cath on 11/08/2023: EF 45% at that time, LIMA to LAD and SVG to PDA patent. -Monitor on continuous telemetry - Initiate IV Lasix  40mg  BID - Strict I's and O's - GDMT as BP tolerates - Patient will likely require at least a maintenance dose of diuretic outpatient on discharge - Resume home meds  Paroxysmal A-fib - At home taking amiodarone  400 mg once daily.  Have confirmed on prior cardiology visit that this dose is correct.  Will resume this for now. - Continue home dose Eliquis  - Continue close monitoring on telemetry  AKI on CKD3a Status post renal transplant 2016 - Patient has old AV fistula where he was previously on dialysis. - Resume home meds: Tacrolimus , CellCept . - Native kidney disease secondary to diabetes/hypertension. - Daily CMP to monitor creatinine as patient receives diuresis - Currently creatinine above baseline.  Baseline appears close 1.2, 1.55 on arrival - If it begins to worsen or urinary output declines we will consult nephrology in house - Renally dosed with a creatinine clearance of 43 - Avoid nephrotoxic medications for now, hold losartan  - Patient follows with [facility] nephrology outpatient  Diabetes with hyperglycemia - Have ordered hemoglobin A1c for baseline - Start sliding scale insulin , titrate up as tolerated - At home takes 24 units Lantus  plus bolus dosing, and linagliptin. - Hold oral antihyperglycemics for now - Patient is using freestyle libre  BPH - Continue home meds - Follows with urology, underwent UroLift November 2024. - Monitor closely for urinary retention  Degenerative disc disease - Resume home meds - PT ordered  OSA - Ordered CPAP at night  Hypertension - Hold home dose losartan  for now given AKI.  As needed hydralazine  and labetalol . - Goal BP 130/80. - May  require less antihypertensives while receiving IV diuretics    Advance Care Planning:   Code Status: Full Code   Consults: n/a  Family Communication: Wife and daughter at bedside for discussion.  Severity of Illness: The appropriate patient status for this patient is INPATIENT. Inpatient status is judged to be reasonable and necessary in order to provide the required intensity of service to ensure the patient's safety. The patient's presenting symptoms, physical exam findings, and initial radiographic and laboratory data in the context of their chronic comorbidities is felt to place them at high risk for further clinical deterioration. Furthermore, it is not anticipated that the patient will be medically stable for discharge from the hospital within 2 midnights of admission.   * I certify that at the point of admission it is my clinical judgment that the patient will require inpatient hospital care spanning beyond 2 midnights from the point of admission due to high intensity of service, high risk for further deterioration and high frequency of surveillance required.*  Author: Sanjuana Mruk, DO 12/16/2023 1:26 PM  For on call review [email or web address].

## 2023-12-16 NOTE — Patient Instructions (Signed)
-   Your symptoms and physical exam suggests you have exacerbation of heart failure, acute on chronic kidney injury. I recommend you be evaluated in the ER to get labs, imaging, close monitoring when you get water  pill to get rid of the fluid built up. Please go to [facility] ED or nearest ED for emergent evaluation.

## 2023-12-16 NOTE — ED Notes (Signed)
 Called CCMD for transfer of patient monitoring to room 35

## 2023-12-16 NOTE — ED Notes (Signed)
 This RN attempted IV stick x3 without success. Pt tolerated well and states that he is a difficult stick.

## 2023-12-16 NOTE — Assessment & Plan Note (Signed)
 History and physical exam including orthopnea, bibasilar rales, weight gain of about 9 pounds over the last 2 weeks, reduced urine output, AKI on CKD  with clinical signs of volume overload.   We won't be able to monitor patient's renal function, cardiac activity if I were to treat him with diuretics in the clinic.  Recommend patient be evaluated in the ED to get promt labs, imaging and treatment given patient's symptoms and co morbidities.   Patient verbalized understanding. I called [facility] ER to provide brief update on the patient. Patient plans on going to the [facility] ED.

## 2023-12-16 NOTE — Progress Notes (Signed)
 Established Patient, Acute Office Visit   Subjective  Patient ID: [name], male    DOB: 1956-03-16  Age: 68 y.o. MRN: [id number]  Chief Complaint  Patient presents with   Urinary Retention   Joint Swelling   Edema    He  has a past medical history of Aortic atherosclerosis (HCC), Arthritis, BPH (benign prostatic hyperplasia), Carotid arterial disease (HCC), Coronary artery disease, DDD (degenerative disc disease), lumbar, Dyspnea, ESRD (end stage renal disease) (HCC), GERD (gastroesophageal reflux disease), History of blood transfusion, Hypertension, Long term current use of aspirin , Long term current use of immunosuppressive drug, Myocardial infarction (HCC) (2004), Myocardial infarction (HCC) (2005), On apixaban  therapy, PAF (paroxysmal atrial fibrillation) (HCC), Sepsis due to gram-negative UTI (HCC) (04/27/2020), 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  HPI Patient with medical h/o of afib, DM, renal transplant, CKD, CAD, OSA on CPAP, HTN presents for evaluation of following concerns: - Weight gain, abdominal bloating, difficulty breathing, shortness of breath progressively worsening over the last 2 weeks.  - Symptoms initially started 2 weeks ago with patient feeling tired, weak. - 1 week of shortness of breath at rest. Initially SOB was associated to exertion only.  - 1 day of worsening orthopnea and wheezing. Unable to use CPAP for one day. - Patient also reports of almost 10 pound weight gain in 2 weeks.  - Patient also reports of reduced urine output for about one week.  - He denies chest pain, nausea, vomiting, recent illness, headache, constipation, diarrhea.  - Reports he is compliant with his medication. - Cardiologist: Debborah Fairly, MD with [facility]. Underwent cardiac cath  on on 11/08/23 Lef: 45-50%, LIMA to LAD and SVG to PDA patent, with echo having LVEF 45%.  Echo on 10/19/23 with mildly reduced EF.   ROS As per HPI Filed Weights   12/16/23 0854  Weight: 179 lb 9.6 oz (81.5 kg)  Weight on 12/01/23: 171 pounds     Objective:     BP 130/60   Pulse (!) 50   Temp 98.2 F (36.8 C) (Oral)   Ht 5\' 8"  (1.727 m)   Wt 179 lb 9.6 oz (81.5 kg)   SpO2 91%   BMI 27.31 kg/m      11/14/2023    9:10 AM  Depression screen PHQ 2/9  Decreased Interest 0  Down, Depressed, Hopeless 0  PHQ - 2 Score 0  Altered sleeping 0  Tired, decreased energy 0  Change in appetite 0  Feeling bad or failure about yourself  0  Trouble concentrating 0  Moving slowly or fidgety/restless 0  Suicidal thoughts 0  PHQ-9 Score 0      11/14/2023    9:10 AM  GAD 7 : Generalized Anxiety Score  Nervous, Anxious, on Edge 0  Control/stop worrying 0  Worry too much - different things 0  Trouble relaxing 0  Restless 0  Easily annoyed or irritable 0  Afraid - awful might happen 0  Total GAD 7 Score 0    Physical Exam Constitutional:      Appearance: He is ill-appearing.  HENT:     Head: Normocephalic.     Mouth/Throat:     Mouth: Mucous membranes are moist.  Cardiovascular:     Rate and Rhythm: Regular rhythm. Bradycardia present.  Pulmonary:     Breath sounds: Rales (bibasilar, right worse than left) present.  Abdominal:     General: Bowel  sounds are normal. There is distension.     Palpations: There is shifting dullness.     Tenderness: There is no abdominal tenderness.  Musculoskeletal:     Right lower leg: Edema (1+ pititng) present.     Left lower leg: Edema (2+pititng) present.  Skin:    General: Skin is warm.  Neurological:     Mental Status: He is alert and oriented to person, place, and time.  Psychiatric:        Mood and Affect: Mood normal.     11/11/23: Baseline renal function, Cr 1.20, GFR 65, BUN 22 12/15/2023: Cr 1.6, GFR 46 with elevated BUN at 26 Patient's weight today: 179 lb, weight on 12/01/23: 171 lb  Echocardiograms:  10/19/2023:   Suboptimal study due to Body Habitus.  Moderately Dilated Left Atrium.  Mildly Dilated Right Atrium.  Normal chamber sizes.  Mildly Decreased Left Ventricular Ejection Fraction.  Mild-Moderate left ventricular hypertrophy with GRADE 1 (relaxation  abnormality) diastolic dysfunction.  Normal right ventricular systolic function.  Normal right ventricular diastolic function.  Right ventricular diastolic dysfunction.  Normal left ventricular wall motion.  Normal right ventricular wall motion.  Normal pulmonary regurgitation.  Mild tricuspid regurgitation.  Mild pulmonary hypertension.  Trace mitral regurgitation.  Fibrocalcified aortic valve with Normal aortic regurgitation.  No pericardial effusion.     02/10/2023:  Technically difficult study due to body habitus.  Unable to determine left ventricular systolic function due to being in  atrial fibrillation.  Normal right ventricular systolic function.  Normal right ventricular diastolic function.  Mild hypokinesis.  Normal pulmonary artery pressure.  Trace mitral regurgitation.  No pericardial effusion.  Moderately dilated Left atrium  Mildly dilated Right atrium  Mildly dilated Left and Right ventricle.  Mild Left ventricle systolic dysfunction.  Mild LVH.      No results found for any visits on 12/16/23.  The ASCVD Risk score (Arnett DK, et al., 2019) failed to calculate for the following reasons:   The valid total cholesterol range is 130 to 320 mg/dL    Assessment & Plan:  Heart failure with mildly reduced ejection fraction (HFmrEF) (HCC) Assessment & Plan: History and physical exam including orthopnea, bibasilar rales, weight gain of about 9 pounds over the last 2 weeks, reduced urine output, AKI on CKD  with clinical signs of volume overload.   We won't be able to monitor patient's renal function, cardiac activity if I were to treat him with diuretics in the clinic.  Recommend patient be evaluated in the ED to get  promt labs, imaging and treatment given patient's symptoms and co morbidities.   Patient verbalized understanding. I called [facility] ER to provide brief update on the patient. Patient plans on going to the [facility] ED.    Acute on chronic congestive heart failure, unspecified heart failure type [facility]) Assessment & Plan: History and physical exam including orthopnea, bibasilar rales, weight gain of about 9 pounds over the last 2 weeks, reduced urine output, AKI on CKD  with clinical signs of volume overload.   We won't be able to monitor patient's renal function, cardiac activity if I were to treat him with diuretics in the clinic.  Recommend patient be evaluated in the ED to get promt labs, imaging and treatment given patient's symptoms and co morbidities.   Patient verbalized understanding. I called [facility] ER to provide brief update on the patient. Patient plans on going to the [facility] ED.    AKI (acute kidney injury) [facility]) Assessment & Plan:  Plan per CHF exacerbation    Orthopnea Assessment & Plan: Plan per CHF exacerbation.   I spent 55 minutes on the day of this face to face encounter reviewing patient's most recent visit with cardiology,  nephrology,  prior relevant surgical and non surgical procedures, recent  labs and imaging studies, weight trends, counseling on chf exacerbation, need for emergent ED evaluation,  coordinating care with ED department, reviewing the assessment and plan with patient in detail reviewing of  diagnostics, potential therapeutics with patient.  Return in about 1 week (around 12/23/2023).   Jacklin Mascot, MD

## 2023-12-16 NOTE — ED Provider Notes (Signed)
 [facility] Provider Note    Event Date/Time   First MD Initiated Contact with Patient 12/16/23 1015     (approximate)   History   Shortness of Breath   HPI  [name] is a 68 y.o. male who presents to the ED for evaluation of Shortness of Breath   I review a PCP visit from earlier this morning.  Acute office visit for an established patient.  Seen for weight gain, dyspnea and orthopnea. History of CAD s/p CABG x 2, multiple stents, mildly reduced EF.  Left heart cath 1 month ago with moderate multivessel disease, EF 45%, patent grafts.  History of renal transplant 2016 [facility] on immunosuppressive agents.  Baseline creatinine around 1.2.  Anticoagulated on Eliquis .  Patient presents for evaluation of about 2 weeks of progressively worsening weight gain, dyspnea on exertion, orthopnea and lower extremity swelling.  Typical urination frequency but reports slightly decreased output with each void.  No fevers.  No missed doses of any of his medications, including his anticoagulation and immunosuppressive.     Physical Exam   Triage Vital Signs: ED Triage Vitals [12/16/23 1002]  Encounter Vitals Group     BP (!) 158/59     Systolic BP Percentile      Diastolic BP Percentile      Pulse Rate 61     Resp (!) 21     Temp 98.7 F (37.1 C)     Temp src      SpO2 95 %     Weight 179 lb (81.2 kg)     Height 5\' 8"  (1.727 m)     Head Circumference      Peak Flow      Pain Score 0     Pain Loc      Pain Education      Exclude from Growth Chart     Most recent vital signs: Vitals:   12/16/23 1130 12/16/23 1140  BP:  (!) 151/50  Pulse: (!) 46 (!) 45  Resp: 15 17  Temp:    SpO2: 95% 96%    General: Awake, no distress.  CV:  Good peripheral perfusion.  Resp:  Normal effort.  Abd:  No distention.  MSK:  No deformity noted.  Neuro:  No focal deficits appreciated. Other:  Appears volume overload with lower extremity edema, no distress   ED  Results / Procedures / Treatments   Labs (all labs ordered are listed, but only abnormal results are displayed) Labs Reviewed  BASIC METABOLIC PANEL WITH GFR - Abnormal; Notable for the following components:      Result Value   Glucose, Bld 114 (*)    BUN 26 (*)    Creatinine, Ser 1.55 (*)    Calcium  8.3 (*)    GFR, Estimated 48 (*)    All other components within normal limits  CBC - Abnormal; Notable for the following components:   Platelets 148 (*)    All other components within normal limits  BRAIN NATRIURETIC PEPTIDE - Abnormal; Notable for the following components:   B Natriuretic Peptide 684.4 (*)    All other components within normal limits  TROPONIN I (HIGH SENSITIVITY) - Abnormal; Notable for the following components:   Troponin I (High Sensitivity) 18 (*)    All other components within normal limits  MAGNESIUM   TROPONIN I (HIGH SENSITIVITY)    EKG Sinus rhythm with a rate of 60 bpm.  First-degree AV block.  Nonspecific changes without  STEMI.  RADIOLOGY 2 view CXR interpreted by me with pulmonary vascular congestion  Official radiology report(s): DG Chest 2 View Result Date: 12/16/2023 CLINICAL DATA:  Shortness of breath EXAM: CHEST - 2 VIEW COMPARISON:  05/20/2020 x-ray.  Abdomen pelvis CT 05/19/2020 FINDINGS: Status post median sternotomy. Pleural thickening along the right lung base. This opacity appears slightly increased from previous. Component of effusion is possible. Adjacent linear opacities identified favoring scar atelectasis. Borderline cardiopericardial silhouette. Slight prominence of central vasculature. No pneumothorax. No new consolidation. No left-sided effusion. Degenerative changes along the spine. IMPRESSION: Postop chest.  Borderline size heart with some vascular congestion. Blunting of the right costophrenic angle could be pleural thickening but is more than the prior x-ray. A component of effusion is possible. Adjacent bandlike opacity.  Electronically Signed   By: Adrianna Horde M.D.   On: 12/16/2023 10:28    PROCEDURES and INTERVENTIONS:  .1-3 Lead EKG Interpretation  Performed by: Arline Bennett, MD Authorized by: Arline Bennett, MD     Interpretation: abnormal     ECG rate:  48   ECG rate assessment: bradycardic     Rhythm: sinus bradycardia     Ectopy: none     Conduction: normal     Medications  furosemide  (LASIX ) injection 40 mg (has no administration in time range)     IMPRESSION / MDM / ASSESSMENT AND PLAN / ED COURSE  I reviewed the triage vital signs and the nursing notes.  Differential diagnosis includes, but is not limited to, ACS, PTX, PNA, muscle strain/spasm, PE, dissection, anxiety, pleural effusion, transplant rejection  {Patient presents with symptoms of an acute illness or injury that is potentially life-threatening.  Patient with ischemic cardiomyopathy and renal transplant presents with symptoms of subacutely worsening CHF, with signs of cardiorenal syndrome acutely with mild AKI on his typical renal function, requiring medical observation admission to facilitate diuresis.  Some borderline oxygen saturations but no distress.  He does look volume overloaded clinically.  Slight worsening renal function with normal electrolytes.  Elevated BNP.  Normal CBC.  Initiate diuresis and consult medicine for admission.  Clinical Course as of 12/16/23 1208  Fri Dec 16, 2023  1151 Reassessed and discussed plan of care. [DS]  1204 I consulted hospitalist who agrees to admit.  I then placed an ultrasound IV to the right upper arm [DS]    Clinical Course User Index [DS] Arline Bennett, MD     FINAL CLINICAL IMPRESSION(S) / ED DIAGNOSES   Final diagnoses:  Cardiorenal syndrome with renal failure, stage 1-4 or unspecified chronic kidney disease, with heart failure (HCC)     Rx / DC Orders   ED Discharge Orders     None        Note:  This document was prepared using Dragon voice recognition  software and may include unintentional dictation errors.   Arline Bennett, MD 12/16/23 [phone]

## 2023-12-17 DIAGNOSIS — I5043 Acute on chronic combined systolic (congestive) and diastolic (congestive) heart failure: Secondary | ICD-10-CM | POA: Diagnosis not present

## 2023-12-17 LAB — COMPREHENSIVE METABOLIC PANEL WITH GFR
ALT: 18 U/L (ref 0–44)
AST: 19 U/L (ref 15–41)
Albumin: 3.7 g/dL (ref 3.5–5.0)
Alkaline Phosphatase: 37 U/L — ABNORMAL LOW (ref 38–126)
Anion gap: 8 (ref 5–15)
BUN: 34 mg/dL — ABNORMAL HIGH (ref 8–23)
CO2: 26 mmol/L (ref 22–32)
Calcium: 8.4 mg/dL — ABNORMAL LOW (ref 8.9–10.3)
Chloride: 104 mmol/L (ref 98–111)
Creatinine, Ser: 1.88 mg/dL — ABNORMAL HIGH (ref 0.61–1.24)
GFR, Estimated: 38 mL/min — ABNORMAL LOW (ref >60)
Glucose, Bld: 118 mg/dL — ABNORMAL HIGH (ref 70–99)
Potassium: 4.5 mmol/L (ref 3.5–5.1)
Sodium: 138 mmol/L (ref 135–145)
Total Bilirubin: 1.2 mg/dL (ref 0.0–1.2)
Total Protein: 6.9 g/dL (ref 6.5–8.1)

## 2023-12-17 LAB — PHOSPHORUS: Phosphorus: 4.4 mg/dL (ref 2.5–4.6)

## 2023-12-17 LAB — MAGNESIUM: Magnesium: 2 mg/dL (ref 1.7–2.4)

## 2023-12-17 LAB — CBC WITH DIFFERENTIAL/PLATELET
Abs Immature Granulocytes: 0.03 10*3/uL (ref 0.00–0.07)
Basophils Absolute: 0 10*3/uL (ref 0.0–0.1)
Basophils Relative: 0 %
Eosinophils Absolute: 0.2 10*3/uL (ref 0.0–0.5)
Eosinophils Relative: 2 %
HCT: 44.3 % (ref 39.0–52.0)
Hemoglobin: 14.6 g/dL (ref 13.0–17.0)
Immature Granulocytes: 0 %
Lymphocytes Relative: 15 %
Lymphs Abs: 1.1 10*3/uL (ref 0.7–4.0)
MCH: 30.4 pg (ref 26.0–34.0)
MCHC: 33 g/dL (ref 30.0–36.0)
MCV: 92.3 fL (ref 80.0–100.0)
Monocytes Absolute: 0.7 10*3/uL (ref 0.1–1.0)
Monocytes Relative: 10 %
Neutro Abs: 5 10*3/uL (ref 1.7–7.7)
Neutrophils Relative %: 73 %
Platelets: 160 10*3/uL (ref 150–400)
RBC: 4.8 MIL/uL (ref 4.22–5.81)
RDW: 13.4 % (ref 11.5–15.5)
WBC: 7 10*3/uL (ref 4.0–10.5)
nRBC: 0 % (ref 0.0–0.2)

## 2023-12-17 LAB — HIV ANTIBODY (ROUTINE TESTING W REFLEX): HIV Screen 4th Generation wRfx: NONREACTIVE

## 2023-12-17 LAB — CBG MONITORING, ED
Glucose-Capillary: 131 mg/dL — ABNORMAL HIGH (ref 70–99)
Glucose-Capillary: 226 mg/dL — ABNORMAL HIGH (ref 70–99)
Glucose-Capillary: 131 mg/dL — ABNORMAL HIGH (ref 70–99)
Glucose-Capillary: 166 mg/dL — ABNORMAL HIGH (ref 70–99)

## 2023-12-17 LAB — GLUCOSE, CAPILLARY: Glucose-Capillary: 227 mg/dL — ABNORMAL HIGH (ref 70–99)

## 2023-12-17 MED ORDER — INSULIN ASPART 100 UNIT/ML IJ SOLN
0.0000 [IU] | Freq: Three times a day (TID) | INTRAMUSCULAR | Status: DC
  Administered 2023-12-17: 3 [IU] via SUBCUTANEOUS
  Administered 2023-12-18: 2 [IU] via SUBCUTANEOUS
  Filled 2023-12-17 (×2): qty 1

## 2023-12-17 MED ORDER — AMIODARONE HCL 200 MG PO TABS
200.0000 mg | ORAL_TABLET | Freq: Every day | ORAL | Status: DC
  Administered 2023-12-18: 200 mg via ORAL
  Filled 2023-12-17: qty 1

## 2023-12-17 NOTE — Progress Notes (Signed)
 PROGRESS NOTE    [name]  WUJ:[id number] DOB: [birth year] DOA: 12/16/2023 PCP: Jacklin Mascot, MD  Chief Complaint  Patient presents with   Shortness of Breath    HPI: [name] is a 68 y.o. male with medical history significant of aortic atherosclerosis, arthritis, BPH, carotid artery disease, CAD with prior CABG, degenerative disc disease, CKD status post renal transplant, A-fib, diabetes, OSA on CPAP, hypertension, heart failure with reduced EF. Patient presented to his PCP this morning complaining of weight gain, abdominal bloating, orthopnea.  All symptoms have been worsening over the last 2 weeks.  Patient reports his orthopnea has become so severe he was unable to use his CPAP at home.  He also endorses a 10 pound weight gain in the last 2 weeks with decreased urinary output.  He denies any chest pain, nausea/vomiting, recent illness, fever.  He has been taking all medications as prescribed He follows with outpatient cardiology Dr. Debborah Fairly.  He underwent cardiac cath on 11/08/2023: EF 45% at that time, LIMA to LAD and SVG to PDA patent. Patient reports he has previously been on diuretics but does not take any now and has not been prescribed any in a long time.  He believes he holds fluid in his abdomen, and reports he does not typically have swelling in his legs. In the ED he was found to have creatinine elevated above baseline.  Chest x-ray reveals vascular congestion with likely pleural effusion.  BNP 684.  Patient received first dose of IV Lasix  and was admitted for further management.  Subjective: On arrival patient is found sitting up eating lunch.  He reports he is feeling much better.  Shortness of breath is much improved.  His abdominal bloating and lower extremity edema has also improved.  Patient endorses feeling very grateful to our team   Objective: Vitals:   12/17/23 1000 12/17/23 1400 12/17/23 1600 12/17/23 1630  BP: (!) 162/44 (!) 154/48 118/65 (!)  157/70  Pulse: (!) 49 (!) 46 (!) 47 (!) 55  Resp: 19 17 20 20   Temp:      TempSrc:      SpO2: 92% 90% 95% 94%  Weight:      Height:        Intake/Output Summary (Last 24 hours) at 12/17/2023 1701 Last data filed at 12/17/2023 9562 Gross per 24 hour  Intake --  Output 2700 ml  Net -2700 ml   Filed Weights   12/16/23 1002  Weight: 81.2 kg    Examination: General exam: Appears calm and comfortable, NAD  Respiratory system: No work of breathing, symmetric chest wall expansion Cardiovascular system: S1 & S2 heard, RRR.  Gastrointestinal system: Abdomen is nondistended, soft and nontender.  Neuro: Alert and oriented. No focal neurological deficits. Extremities: Symmetric, expected ROM and no lower extremity edema Skin: No rashes, lesions Psychiatry: Demonstrates appropriate judgement and insight. Mood & affect appropriate for situation.   Assessment & Plan:  Principal Problem:   Acute exacerbation of CHF (congestive heart failure) (HCC)    Acute heart failure exacerbation CAD s/p  CABG 2007 -cardiac cath on 11/08/2023: EF 45% at that time, LIMA to LAD and SVG to PDA patent. - Continue close monitoring on telemetry - Continue with IV Lasix  40 mg twice daily - Strict I's and O's - 2.7 L output so far - Continue GDMT as BP tolerates - Will likely require at least maintenance dose of diuretic outpatient   Paroxysmal A-fib - At home taking amiodarone  400  mg once daily.  Have confirmed on prior cardiology visit that this dose is correct. - Patient is bradycardic this morning into the 40s, have decreased amiodarone  to 200 mg - Continue home dose Eliquis  - Continue close monitoring on telemetry - Will need close outpatient follow-up with cardiology for medication titration    AKI on CKD3a Status post renal transplant 2016 - Some acute worsening of kidney function today.  Baseline creatinine 1.2, 1.55 on arrival, up to 1.88. - Acute worsening secondary to diuresis. - Have  consulted nephrology, appreciate recommendations - Will plan to continue with IV Lasix  40 twice daily for now.  Will decrease as diuresis slows.  Patient's becoming euvolemic quickly, may be able to stop IV diuretics tomorrow - Continue with tacrolimus  and CellCept .  Tacrolimus  level ordered. - Native kidney disease secondary to diabetes/hypertension. - Continue daily CMP to monitor creatinine. - Renally dosed with a creatinine clearance of 35 when needed - Avoid further nephrotoxic medications when able.  Does necessitate dialysis currently - Hold losartan  for now   Diabetes with hyperglycemia, well-controlled - Hemoglobin A1c 6.6% - Follow CBGs. - Titrate insulin  needs per CBG. - Home meds: 24 units Lantus , linagliptin.  Hold oral meds while admitted - Patient has freestyle libre CGM   BPH - Continue home meds - Follows with urology, underwent UroLift November 2024. - Continue to monitor closely for urinary retention.  Has had excellent output so far   Degenerative disc disease - Resume home meds - Worked with physical therapy today, and PAC score: 23.  No skilled needs   OSA - Ordered CPAP at night   Hypertension - Hold home dose losartan  for now given AKI.  As needed hydralazine  and labetalol . - Goal BP 130/80. - Follow closely titrate as needed  BMI 27  DVT prophylaxis: Home dose eliquis    Code Status: Full Code Disposition:  Inpatient, receiving additional diuresis today.  May be able to go home tomorrow if kidney function improves and diuresis slows  Consultants:  Treatment Team:  Consulting Physician: Worthy Heads, MD  Procedures:    Antimicrobials:  Anti-infectives (From admission, onward)    None       Data Reviewed: I have personally reviewed following labs and imaging studies CBC: Recent Labs  Lab 12/16/23 1010 12/17/23 0553  WBC 7.1 7.0  NEUTROABS  --  5.0  HGB 14.2 14.6  HCT 44.1 44.3  MCV 94.0 92.3  PLT 148* 160   Basic Metabolic  Panel: Recent Labs  Lab 12/16/23 1010 12/17/23 0553  NA 136 138  K 4.2 4.5  CL 104 104  CO2 22 26  GLUCOSE 114* 118*  BUN 26* 34*  CREATININE 1.55* 1.88*  CALCIUM  8.3* 8.4*  MG 2.0 2.0  PHOS  --  4.4   GFR: Estimated Creatinine Clearance: 35.9 mL/min (A) (by C-G formula based on SCr of 1.88 mg/dL (H)). Liver Function Tests: Recent Labs  Lab 12/17/23 0553  AST 19  ALT 18  ALKPHOS 37*  BILITOT 1.2  PROT 6.9  ALBUMIN 3.7   CBG: Recent Labs  Lab 12/16/23 1754 12/16/23 2137 12/17/23 0828 12/17/23 1202 12/17/23 1611  GLUCAP 156* 98 131* 226* 131*    No results found for this or any previous visit (from the past 240 hours).   Radiology Studies: DG Chest 2 View Result Date: 12/16/2023 CLINICAL DATA:  Shortness of breath EXAM: CHEST - 2 VIEW COMPARISON:  05/20/2020 x-ray.  Abdomen pelvis CT 05/19/2020 FINDINGS: Status post median sternotomy. Pleural  thickening along the right lung base. This opacity appears slightly increased from previous. Component of effusion is possible. Adjacent linear opacities identified favoring scar atelectasis. Borderline cardiopericardial silhouette. Slight prominence of central vasculature. No pneumothorax. No new consolidation. No left-sided effusion. Degenerative changes along the spine. IMPRESSION: Postop chest.  Borderline size heart with some vascular congestion. Blunting of the right costophrenic angle could be pleural thickening but is more than the prior x-ray. A component of effusion is possible. Adjacent bandlike opacity. Electronically Signed   By: Adrianna Horde M.D.   On: 12/16/2023 10:28    Scheduled Meds:  [START ON 12/18/2023] amiodarone   200 mg Oral Daily   apixaban   5 mg Oral BID   aspirin  EC  81 mg Oral Q0600   atorvastatin   40 mg Oral QHS   finasteride   5 mg Oral Daily   furosemide   40 mg Intravenous Q12H   insulin  aspart  0-9 Units Subcutaneous TID WC   mycophenolate   500 mg Oral BID   pantoprazole   40 mg Oral Daily    tacrolimus   0.5 mg Oral QHS   tacrolimus   1 mg Oral [id number]   tamsulosin   0.8 mg Oral QPM   Continuous Infusions:   LOS: 1 day  MDM: Patient is high risk for one or more organ failure.  They necessitate ongoing hospitalization for continued IV therapies and subsequent lab monitoring. Total time spent interpreting labs and vitals, reviewing the medical record, coordinating care amongst consultants and care team members, directly assessing and discussing care with the patient and/or family: 55 min  Emeree Mahler, DO [facility]  To contact the attending physician between 7A-7P please use Epic Chat. To contact the covering physician during after hours 7P-7A, please review Amion.  12/17/2023, 5:01 PM   *This document has been created with the assistance of dictation software. Please excuse typographical errors. *

## 2023-12-17 NOTE — Progress Notes (Signed)
 [facility] CONSULT NOTE    Date: 12/17/2023                  Patient Name:  [name]  MRN: [id number]  DOB: 03-05-1956  Age / Sex: 68 y.o., male         PCP: Jacklin Mascot, MD                 Service Requesting Consult: Medicine                  Reason for Consult: Acute kidney injury            History of Present Illness: Patient is a 68 y.o. 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  Patient also has history of hypertension, coronary artery disease, congestive heart failure, coronary artery bypass graft surgery, atrial fibrillation, diabetes, obstructive sleep apnea on CPAP, hyperlipidemia and benign prostatic hypertrophy.  He is now admitted with history of edema, shortness of breath and dyspnea on exertion and found to have congestive heart failure.  He was diuresed well with intravenous diuretics and feels much better this a.m.  Medications: Outpatient medications: (Not in a hospital admission)   Discontinued Meds:   Medications Discontinued During This Encounter  Medication Reason   heparin  injection 5,000 Units    losartan  (COZAAR ) tablet 100 mg    amiodarone  (PACERONE ) tablet 400 mg     Current medications: Current Facility-Administered Medications  Medication Dose Route Frequency Provider Last Rate Last Admin   acetaminophen  (TYLENOL ) tablet 650 mg  650 mg Oral Q6H PRN Dezii, Alexandra, DO       [START ON 12/18/2023] amiodarone  (PACERONE ) tablet 200 mg  200 mg Oral Daily Dezii, Alexandra, DO       apixaban  (ELIQUIS ) tablet 5 mg  5 mg Oral BID Dezii, Alexandra, DO   5 mg at 12/16/23 2145   aspirin  EC tablet 81 mg  81 mg Oral Q0600 Dezii, Alexandra, DO   81 mg at 12/17/23 0545   atorvastatin  (LIPITOR) tablet 40 mg  40 mg Oral QHS Dezii, Alexandra, DO   40 mg at 12/16/23 2145   finasteride  (PROSCAR ) tablet 5 mg  5 mg Oral Daily Dezii, Alexandra, DO       furosemide  (LASIX ) injection 40 mg  40 mg  Intravenous Q12H Dezii, Alexandra, DO   40 mg at 12/17/23 0546   hydrALAZINE  (APRESOLINE ) injection 10 mg  10 mg Intravenous Q6H PRN Dezii, Alexandra, DO       insulin  aspart (novoLOG ) injection 0-9 Units  0-9 Units Subcutaneous TID WC Dezii, Alexandra, DO   1 Units at 12/17/23 0831   labetalol  (NORMODYNE ) injection 10 mg  10 mg Intravenous Q2H PRN Dezii, Alexandra, DO       Muscle Rub CREA 1 Application  1 Application Topical PRN Dezii, Alexandra, DO       mycophenolate  (CELLCEPT ) capsule 500 mg  500 mg Oral BID Dezii, Alexandra, DO   500 mg at 12/16/23 2242   nitroGLYCERIN  (NITROSTAT ) SL tablet 0.4 mg  0.4 mg Sublingual Q5 min PRN Dezii, Alexandra, DO       pantoprazole  (PROTONIX ) EC tablet 40 mg  40 mg Oral Daily Dezii, Alexandra, DO   40 mg at 12/17/23 0956   sodium chloride  (OCEAN) 0.65 % nasal spray 1 spray  1 spray Each Nare PRN Dezii, Alexandra, DO       tacrolimus  (PROGRAF ) capsule 0.5 mg  0.5 mg Oral QHS Dezii, Alexandra, DO   0.5 mg at 12/16/23 2242   tacrolimus  (PROGRAF ) capsule 1 mg  1 mg Oral BH-q7a Dezii, Alexandra, DO       tamsulosin  (FLOMAX ) capsule 0.8 mg  0.8 mg Oral QPM Dezii, Alexandra, DO   0.8 mg at 12/16/23 1935   Current Outpatient Medications  Medication Sig Dispense Refill   amiodarone  (PACERONE ) 400 MG tablet Take 1 tablet (400 mg total) by mouth daily. 60 tablet 0   aspirin  EC 81 MG EC tablet Take 1 tablet (81 mg total) by mouth daily at 6 (six) AM.     atorvastatin  (LIPITOR) 40 MG tablet Take 40 mg by mouth at bedtime.     CELLCEPT  250 MG capsule Take 500 mg by mouth 2 (two) times daily.     Cholecalciferol  (VITAMIN D) 50 MCG (2000 UT) CAPS Take 2,000 Units by mouth daily.     ELIQUIS  5 MG TABS tablet Take 5 mg by mouth 2 (two) times daily.      finasteride  (PROSCAR ) 5 MG tablet TAKE 1 TABLET BY MOUTH ONCE DAILY. 90 tablet 0   HUMALOG KWIKPEN 100 UNIT/ML KiwkPen Inject 10-12 Units into the skin 3 (three) times daily. Inject 10 units daily at breakfast, 10 units at  lunch and up to 10 units or less at supper per SSI     LANTUS  SOLOSTAR 100 UNIT/ML Solostar Pen Inject 24 Units into the skin at bedtime.      linagliptin (TRADJENTA) 5 MG TABS tablet Take 5 mg by mouth daily.     losartan  (COZAAR ) 100 MG tablet Take 1 tablet (100 mg total) by mouth daily. 30 tablet 11   omeprazole (PRILOSEC) 20 MG capsule Take 20 mg by mouth daily.     PROGRAF  1 MG capsule Take 1 mg by mouth every morning.     tacrolimus  (PROGRAF ) 0.5 MG capsule Take 0.5 mg by mouth at bedtime.     tamsulosin  (FLOMAX ) 0.4 MG CAPS capsule Take 0.8 mg by mouth daily.     ascorbic acid (VITAMIN C) 500 MG tablet Take 500 mg by mouth daily.     Continuous Blood Gluc Sensor (FREESTYLE LIBRE 2 SENSOR) MISC by Does not apply route.     Insulin  Pen Needle 33G X 4 MM MISC To use with insulin  pen 4 times per day. E 13.9     nitroGLYCERIN  (NITROSTAT ) 0.4 MG SL tablet Place 1 tablet (0.4 mg total) under the tongue every 5 (five) minutes as needed for chest pain. 100 tablet 3   Facility-Administered Medications Ordered in Other Encounters  Medication Dose Route Frequency Provider Last Rate Last Admin   sodium chloride  flush (NS) 0.9 % injection 3 mL  3 mL Intravenous Q12H Cherrie Cornwall, MD          Allergies: Allergies  Allergen Reactions   Oxybutynin  Other (See Comments)    Incomplete bladder emptying      Past Medical History: Past Medical History:  Diagnosis Date   Aortic atherosclerosis (HCC)    Arthritis    BPH (benign prostatic hyperplasia)    Carotid arterial disease (HCC)    a.) s/p LEFT CEA on 09/06/2019   Coronary artery disease    a.) MI with stents x 2 in 2004; b.) MI with stent x 1 2005; c.) s/p CABG x 2 06/02/2006 (LIMA-LAD, SVG-PDA); d.) LHC/PCI 08/25/2007: 80% oD1 (2.5 x 12 mm Xience V DES)   DDD (degenerative disc disease), lumbar  Dyspnea    ESRD (end stage renal disease) (HCC)    a.) s/p RIGHT renal 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/2015   GERD (gastroesophageal reflux disease)     History of blood transfusion    Hypertension    Long term current use of aspirin     Long term current use of immunosuppressive drug    a.) mycophenolate  + tacrolimus    Myocardial infarction [facility]) 2004   a.) details unclear; stents x 2 (unknown type/location)   Myocardial infarction (HCC) 2005   a.) details unclear; stent x 1 (unknown type/location)   On apixaban  therapy    PAF (paroxysmal atrial fibrillation) (HCC)    a.) CHA2DS2-VASc = 5 (age, HTN, vascular disease history/MI, T2DM) as of 06/10/2023; b.) cardiac rate/rhythm maintained intrinsically without pharmacological intervention; chronically anticoagulated using apixaban    Sepsis due to gram-negative UTI (HCC) 04/27/2020   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   From DM and HTN, f/u with [facility] every 6 months   T2DM (type 2 diabetes mellitus) (HCC)    Unstable angina (HCC)    Vitamin D deficiency      Past Surgical History: Past Surgical History:  Procedure Laterality Date   CATARACT EXTRACTION Bilateral    CATARACT EXTRACTION W/PHACO Right 02/03/2017   Procedure: CATARACT EXTRACTION PHACO AND INTRAOCULAR LENS PLACEMENT (IOC);  Surgeon: Annell Kidney, MD;  Location: [facility] ORS;  Service: Ophthalmology;  Laterality: Right;  US  00:35.8 AP% 12.8 CDE 4.57 Fluid lot # [id number]   COLONOSCOPY WITH PROPOFOL  N/A 12/30/2021   Procedure: COLONOSCOPY WITH PROPOFOL ;  Surgeon: Luke Salaam, MD;  Location: [facility] ENDOSCOPY;  Service: Gastroenterology;  Laterality: N/A;   CORONARY ANGIOPLASTY WITH STENT PLACEMENT Left 2004   stents x 2   CORONARY ANGIOPLASTY WITH STENT PLACEMENT Left 2005   stent x 1   CORONARY ANGIOPLASTY WITH STENT PLACEMENT Left 08/25/2007   Procedure: CORONARY ANGIOPLASTY WITH STENT PLACEMENT; Location: [facility]; Surgeon: Jama Mayotte, MD   CORONARY ARTERY BYPASS GRAFT N/A 06/08/2006   Procedure: CORONARY ARTERY BYPASS GRAFT; Location: [facility]; Surgeon: Dorethea Ganong, MD   CYSTOSCOPY WITH INSERTION OF UROLIFT N/A  06/13/2023   Procedure: CYSTOSCOPY WITH INSERTION OF UROLIFT;  Surgeon: Dustin Gimenez, MD;  Location: [facility] ORS;  Service: Urology;  Laterality: N/A;   ENDARTERECTOMY Left 09/06/2019   Procedure: ENDARTERECTOMY CAROTID;  Surgeon: Celso College, MD;  Location: [facility] ORS;  Service: Vascular;  Laterality: Left;   HIP FRACTURE SURGERY     KIDNEY TRANSPLANT Right 05/10/2015   LEFT HEART CATH AND CORONARY ANGIOGRAPHY Left 05/20/2006   Procedure: LEFT HEART CATH AND CORONARY ANGIOGRAPHY; Location: [facility]; Surgeon: Debborah Fairly, MD   LEFT HEART CATH AND CORONARY ANGIOGRAPHY Left 11/08/2023   Procedure: LEFT HEART CATH AND CORONARY ANGIOGRAPHY with possible intervention;  Surgeon: Cherrie Cornwall, MD;  Location: [facility] INVASIVE CV LAB;  Service: Cardiovascular;  Laterality: Left;   LEFT HEART CATH AND CORS/GRAFTS ANGIOGRAPHY Left 08/10/2007   Procedure: LEFT HEART CATH AND CORS/GRAFTS ANGIOGRAPHY; Location: [facility]; Surgeon: Debborah Fairly, MD   LEFT HEART CATH AND CORS/GRAFTS ANGIOGRAPHY Left 05/04/2011   Procedure: LEFT HEART CATH AND CORS/GRAFTS ANGIOGRAPHY; Location: [facility]; Surgeon: Debborah Fairly, MD     Family History: Family History  Problem Relation Age of Onset   Osteoporosis Mother    Drug abuse Sister    Drug abuse Brother      Social History: Social History   Socioeconomic History   Marital status: Married    Spouse name: Not on file   Number of children: Not on  file   Years of education: Not on file   Highest education level: 5th grade  Occupational History   Not on file  Tobacco Use   Smoking status: Former   Smokeless tobacco: Never   Tobacco comments:    30 years ago  Vaping Use   Vaping status: Never Used  Substance and Sexual Activity   Alcohol use: No    Alcohol/week: 0.0 standard drinks of alcohol   Drug use: Never   Sexual activity: Not on file  Other Topics Concern   Not on file  Social History Narrative   Not on file   Social Drivers of Health   Financial  Resource Strain: Low Risk  (11/30/2023)   Overall Financial Resource Strain (CARDIA)    Difficulty of Paying Living Expenses: Not hard at all  Food Insecurity: No Food Insecurity (11/30/2023)   Hunger Vital Sign    Worried About Running Out of Food in the Last Year: Never true    Ran Out of Food in the Last Year: Never true  Transportation Needs: No Transportation Needs (11/30/2023)   PRAPARE - Administrator, Civil Service (Medical): No    Lack of Transportation (Non-Medical): No  Physical Activity: Sufficiently Active (11/30/2023)   Exercise Vital Sign    Days of Exercise per Week: 4 days    Minutes of Exercise per Session: 60 min  Stress: No Stress Concern Present (11/30/2023)   [employer] of Occupational Health - Occupational Stress Questionnaire    Feeling of Stress : Not at all  Social Connections: Moderately Integrated (11/30/2023)   Social Connection and Isolation Panel [NHANES]    Frequency of Communication with Friends and Family: More than three times a week    Frequency of Social Gatherings with Friends and Family: More than three times a week    Attends Religious Services: More than 4 times per year    Active Member of [employer] or Organizations: No    Attends Engineer, structural: Not on file    Marital Status: Married  Catering manager Violence: Not on file     Review of Systems: As per HPI  Vital Signs: Blood pressure (!) 162/44, pulse (!) 49, temperature 97.9 F (36.6 C), temperature source Oral, resp. rate 19, height 5\' 8"  (1.727 m), weight 81.2 kg, SpO2 92%.  Weight trends: Filed Weights   12/16/23 1002  Weight: 81.2 kg    Physical Exam: Physical Exam: General:  No acute distress  Head:  Normocephalic, atraumatic. Moist oral mucosal membranes  Eyes:  Anicteric  Neck:  Supple  Lungs:   Clear to auscultation, normal effort  Heart:  S1S2 no rubs  Abdomen:   Soft, nontender, bowel sounds present  Extremities:  peripheral edema.   Neurologic:  Awake, alert, following commands  Skin:  No lesions  Access:     Lab results:  Basic Metabolic Panel: Recent Labs  Lab 12/16/23 1010 12/17/23 0553  NA 136 138  K 4.2 4.5  CL 104 104  CO2 22 26  GLUCOSE 114* 118*  BUN 26* 34*  CREATININE 1.55* 1.88*  CALCIUM  8.3* 8.4*  MG 2.0 2.0  PHOS  --  4.4    Creatinine, Ser  Date/Time Value Ref Range Status  12/17/2023 05:53 AM 1.88 (H) 0.61 - 1.24 mg/dL Final  09/81/1914 78:29 AM 1.55 (H) 0.61 - 1.24 mg/dL Final  56/21/3086 57:84 PM 1.20 0.76 - 1.27 mg/dL Final  69/62/9528 41:32 AM 1.26 0.76 - 1.27 mg/dL Final  05/22/2020 05:15 AM 1.03 0.61 - 1.24 mg/dL Final  08/04/7251 66:44 AM 1.34 (H) 0.61 - 1.24 mg/dL Final  03/47/4259 56:38 AM 1.25 (H) 0.61 - 1.24 mg/dL Final  75/64/3329 51:88 AM 1.52 (H) 0.61 - 1.24 mg/dL Final  41/66/0630 16:01 AM 1.22 0.61 - 1.24 mg/dL Final  09/32/3557 32:20 AM 1.22 0.61 - 1.24 mg/dL Final  25/42/7062 37:62 AM 1.41 (H) 0.61 - 1.24 mg/dL Final  83/15/1761 60:73 PM 1.66 (H) 0.61 - 1.24 mg/dL Final  71/12/2692 85:46 AM 0.88 0.61 - 1.24 mg/dL Final  27/10/5007 38:18 AM 1.18 0.61 - 1.24 mg/dL Final  29/93/7169 67:89 AM 1.25 (H) 0.61 - 1.24 mg/dL Final  38/05/1750 02:58 PM 1.24 0.61 - 1.24 mg/dL Final  52/77/8242 35:36 AM 4.22 (H) 0.50 - 1.35 mg/dL Final  14/43/1540 08:67 PM 6.30 (H) 0.50 - 1.35 mg/dL Final  61/95/0932 67:12 AM 5.13 (H) 0.4 - 1.5 mg/dL Final  45/80/9983 38:25 AM 5.00 (H) 0.4 - 1.5 mg/dL Final  05/39/7673 41:93 AM 4.30 (H) 0.4 - 1.5 mg/dL Final  79/09/4095 35:32 AM 3.92 (H) 0.4 - 1.5 mg/dL Final  99/24/2683 41:96 PM 3.37 (H) 0.4 - 1.5 mg/dL Final  22/29/7989 21:19 AM 4.12 (H) 0.4 - 1.5 mg/dL Final  41/74/0814 48:18 AM 3.69 (H) 0.4 - 1.5 mg/dL Final  56/31/4970 26:37 AM 3.01 DIALYSIS (H) 0.4 - 1.5 mg/dL Final  85/88/5027 74:12 AM 4.95 (H) 0.4 - 1.5 mg/dL Final  87/86/7672 09:47 PM 5.40 (H) 0.4 - 1.5 mg/dL Final  09/62/8366 29:47 AM 4.48 (H) 0.4 - 1.5 mg/dL Final   65/46/5035 46:56 PM 5.08 (H) 0.4 - 1.5 mg/dL Final  81/27/5170 01:74 PM 5.89 (H) 0.4 - 1.5 mg/dL Final  94/49/6759 16:38 PM 5.11 (H) 0.4 - 1.5 mg/dL Final  46/65/9935 70:17 PM 5.96 (H) 0.4 - 1.5 mg/dL Final  79/39/0300 92:33 AM 5.92 (H) 0.4 - 1.5 mg/dL Final  00/76/2263 33:54 AM 3.33 DELTA CHECK NOTED (H) 0.4 - 1.5 mg/dL Final  56/25/6389 37:34 AM 5.18 (H) 0.4 - 1.5 mg/dL Final  28/76/8115 72:62 AM 4.09 (H) 0.4 - 1.5 mg/dL Final  03/55/9741 63:84 AM 4.88 (H) 0.4 - 1.5 mg/dL Final  53/64/6803 21:22 PM 4.76 (H) 0.4 - 1.5 mg/dL Final  48/25/0037 04:88 PM 4.59 (H) 0.4 - 1.5 mg/dL Final  89/16/9450 38:88 AM 5.35 DELTA CHECK NOTED (H) 0.4 - 1.5 mg/dL Final  28/00/3491 79:15 AM 3.28 (H) 0.4 - 1.5 mg/dL Final  05/69/7948 01:65 AM 2.76 (H) 0.4 - 1.5 mg/dL Final  53/74/8270 78:67 AM 3.59 DELTA CHECK NOTED (H) 0.4 - 1.5 mg/dL Final  54/49/2010 07:12 AM 2.26 DELTA CHECK NOTED (H) 0.4 - 1.5 mg/dL Final  19/75/8832 54:98 AM 3.83 (H) 0.4 - 1.5 mg/dL Final  26/41/5830 94:07 PM 4.05 (H) 0.4 - 1.5 mg/dL Final    CBC: Recent Labs  Lab 12/16/23 1010 12/17/23 0553  WBC 7.1 7.0  NEUTROABS  --  5.0  HGB 14.2 14.6  HCT 44.1 44.3  MCV 94.0 92.3  PLT 148* 160    Microbiology: Results for orders placed or performed in visit on 05/11/23  CULTURE, URINE COMPREHENSIVE     Status: None   Collection Time: 05/11/23 11:38 AM   Specimen: Urine   UR  Result Value Ref Range Status   Urine Culture, Comprehensive Final report  Final   Organism ID, Bacteria Comment  Final    Comment: Mixed urogenital flora 10,000-25,000 colony forming units per mL   Microscopic Examination  Status: None   Collection Time: 05/11/23 11:38 AM   Urine  Result Value Ref Range Status   WBC, UA 0-5 0 - 5 /hpf Final   RBC, Urine 0-2 0 - 2 /hpf Final   Epithelial Cells (non renal) 0-10 0 - 10 /hpf Final   Bacteria, UA Few None seen/Few Final    Urinalysis: No results for input(s): "COLORURINE", "LABSPEC", "PHURINE",  "GLUCOSEU", "HGBUR", "BILIRUBINUR", "KETONESUR", "PROTEINUR", "UROBILINOGEN", "NITRITE", "LEUKOCYTESUR" in the last 72 hours.  Invalid input(s): "APPERANCEUR"   Imaging:  DG Chest 2 View Result Date: 12/16/2023 CLINICAL DATA:  Shortness of breath EXAM: CHEST - 2 VIEW COMPARISON:  05/20/2020 x-ray.  Abdomen pelvis CT 05/19/2020 FINDINGS: Status post median sternotomy. Pleural thickening along the right lung base. This opacity appears slightly increased from previous. Component of effusion is possible. Adjacent linear opacities identified favoring scar atelectasis. Borderline cardiopericardial silhouette. Slight prominence of central vasculature. No pneumothorax. No new consolidation. No left-sided effusion. Degenerative changes along the spine. IMPRESSION: Postop chest.  Borderline size heart with some vascular congestion. Blunting of the right costophrenic angle could be pleural thickening but is more than the prior x-ray. A component of effusion is possible. Adjacent bandlike opacity. Electronically Signed   By: Adrianna Horde M.D.   On: 12/16/2023 10:28     Assessment & Plan:  68 y.o. male with a PMHx of renal transplantation 10 years ago at [facility].  Patient also has history of hypertension, coronary artery disease, congestive heart failure, coronary artery bypass graft surgery, atrial fibrillation, diabetes, obstructive sleep apnea on CPAP, hyperlipidemia and benign prostatic hypertrophy.  He is now admitted with history of edema, shortness of breath and dyspnea on exertion and found to have congestive heart failure.    Principal Problem:   Acute exacerbation of CHF (congestive heart failure) (HCC)   #1: Renal transplant: Patient had renal transplant about 10 years ago at [facility]. He has been on tacrolimus  and CellCept  and will continue the same.  Will check tacrolimus  levels while he is in the hospital.  #2: Acute kidney injury on chronic kidney disease: Patient has a  baseline creatinine of 1.3-1.5.  The acute kidney injury is most likely secondary to diuresis complicated by decreased cardiac output due to CHF.  #3: Congestive heart failure: Will continue IV Lasix  at 40 mg twice a day and monitor urine output.  Patient is advised on importance of fluid restriction.  #4: Diabetes: Continue insulin  protocol.  #5: Hypertension: Losartan  is on hold secondary to acute kidney injury.  Will continue hydralazine  and labetalol  and maintain on 2 g salt restricted diet.  Spoke to the patient and his wife at bedside in detail and answered all the questions. Will continue to follow with you.  LOS: 1 Worthy Heads, MD [facility]. 5/17/202511:04 AM

## 2023-12-17 NOTE — Evaluation (Signed)
 Physical Therapy Evaluation Patient Details Name: [name] MRN: [id number] DOB: 09-Feb-1956 Today's Date: 12/17/2023  History of Present Illness  68 y/o male presented to ED on 12/16/23 for SOB x 2 weeks and noticeable weight gain. Admitted for acute heart failure exacerbation. PMH: aortic atherosclerosis, BPH, CAD with prior CABG, CKD s/p renal transplant, Afib, diabetes, OSA on CPAP, HTN, heart failure with reduced EF  Clinical Impression  Patient admitted with the above. PTA, patient lives with wife and utilizes [facility] for mobility. Patient functioning at modI level with use of SPC. Ambulated on RA with spO2 dropping to 85%. With cueing for pursed lip breathing, patient able to increase back to >90%. Educated patient on activity pacing and pursed lip breathing for maintaining spO2 >90%, patient verbalized understanding. No further skilled PT needs identified acutely. No PT follow up recommended at this time.         If plan is discharge home, recommend the following: Assist for transportation;Help with stairs or ramp for entrance   Can travel by private vehicle        Equipment Recommendations None recommended by PT  Recommendations for Other Services       Functional Status Assessment Patient has not had a recent decline in their functional status     Precautions / Restrictions Precautions Precautions: None Recall of Precautions/Restrictions: Intact Restrictions Weight Bearing Restrictions Per Provider Order: No      Mobility  Bed Mobility Overal bed mobility: Independent                  Transfers Overall transfer level: Modified independent Equipment used: None                    Ambulation/Gait Ambulation/Gait assistance: Modified independent (Device/Increase time) Gait Distance (Feet): 100 Feet Assistive device: Straight cane Gait Pattern/deviations: Drifts right/left Gait velocity: decreased     General Gait Details: modI with SPC.  Ambulated on RA with spO2 dropping to 85% on return to room with cues for pursed lip breathing, patient able to increase spO2 to >90%  Stairs            Wheelchair Mobility     Tilt Bed    Modified Rankin (Stroke Patients Only)       Balance Overall balance assessment: Mild deficits observed, not formally tested                                           Pertinent Vitals/Pain Pain Assessment Pain Assessment: No/denies pain    Home Living Family/patient expects to be discharged to:: Private residence Living Arrangements: Spouse/significant other Available Help at Discharge: Family Type of Home: House Home Access: Stairs to enter Entrance Stairs-Rails: Doctor, general practice of Steps: 2   Home Layout: One level Home Equipment: Cane - single point      Prior Function Prior Level of Function : Independent/Modified Independent;Driving             Mobility Comments: uses SPC for mobility or holds wife's hand       Extremity/Trunk Assessment   Upper Extremity Assessment Upper Extremity Assessment: Overall WFL for tasks assessed    Lower Extremity Assessment Lower Extremity Assessment: Overall WFL for tasks assessed    Cervical / Trunk Assessment Cervical / Trunk Assessment: Normal  Communication   Communication Communication: No apparent difficulties    Cognition Arousal:  Alert Behavior During Therapy: WFL for tasks assessed/performed   PT - Cognitive impairments: No apparent impairments                         Following commands: Intact       Cueing       General Comments      Exercises     Assessment/Plan    PT Assessment Patient does not need any further PT services  PT Problem List         PT Treatment Interventions      PT Goals (Current goals can be found in the Care Plan section)  Acute Rehab PT Goals Patient Stated Goal: to go home PT Goal Formulation: All assessment and education  complete, DC therapy    Frequency       Co-evaluation               AM-PAC PT "6 Clicks" Mobility  Outcome Measure Help needed turning from your back to your side while in a flat bed without using bedrails?: None Help needed moving from lying on your back to sitting on the side of a flat bed without using bedrails?: None Help needed moving to and from a bed to a chair (including a wheelchair)?: None Help needed standing up from a chair using your arms (e.g., wheelchair or bedside chair)?: None Help needed to walk in hospital room?: None Help needed climbing 3-5 steps with a railing? : A Little 6 Click Score: 23    End of Session   Activity Tolerance: Patient tolerated treatment well Patient left: in bed;with call bell/phone within reach;with family/visitor present Nurse Communication: Mobility status PT Visit Diagnosis: Muscle weakness (generalized) (M62.81)    Time: 4098-1191 PT Time Calculation (min) (ACUTE ONLY): 11 min   Charges:   PT Evaluation $PT Eval Low Complexity: 1 Low   PT General Charges $$ ACUTE PT VISIT: 1 Visit         Janine Melbourne, PT, DPT Physical Therapist - [facility]  [facility]   Amaziah Ghosh A Tachina Spoonemore 12/17/2023, 10:12 AM

## 2023-12-18 DIAGNOSIS — I5043 Acute on chronic combined systolic (congestive) and diastolic (congestive) heart failure: Secondary | ICD-10-CM | POA: Diagnosis not present

## 2023-12-18 LAB — COMPREHENSIVE METABOLIC PANEL WITH GFR
ALT: 18 U/L (ref 0–44)
AST: 17 U/L (ref 15–41)
Albumin: 3.6 g/dL (ref 3.5–5.0)
Alkaline Phosphatase: 38 U/L (ref 38–126)
Anion gap: 10 (ref 5–15)
BUN: 41 mg/dL — ABNORMAL HIGH (ref 8–23)
CO2: 26 mmol/L (ref 22–32)
Calcium: 8.4 mg/dL — ABNORMAL LOW (ref 8.9–10.3)
Chloride: 103 mmol/L (ref 98–111)
Creatinine, Ser: 1.71 mg/dL — ABNORMAL HIGH (ref 0.61–1.24)
GFR, Estimated: 43 mL/min — ABNORMAL LOW (ref >60)
Glucose, Bld: 105 mg/dL — ABNORMAL HIGH (ref 70–99)
Potassium: 3.9 mmol/L (ref 3.5–5.1)
Sodium: 139 mmol/L (ref 135–145)
Total Bilirubin: 1.2 mg/dL (ref 0.0–1.2)
Total Protein: 6.7 g/dL (ref 6.5–8.1)

## 2023-12-18 LAB — CBC WITH DIFFERENTIAL/PLATELET
Abs Immature Granulocytes: 0.04 10*3/uL (ref 0.00–0.07)
Basophils Absolute: 0 10*3/uL (ref 0.0–0.1)
Basophils Relative: 0 %
Eosinophils Absolute: 0.2 10*3/uL (ref 0.0–0.5)
Eosinophils Relative: 2 %
HCT: 42.5 % (ref 39.0–52.0)
Hemoglobin: 14.5 g/dL (ref 13.0–17.0)
Immature Granulocytes: 1 %
Lymphocytes Relative: 17 %
Lymphs Abs: 1.2 10*3/uL (ref 0.7–4.0)
MCH: 30.9 pg (ref 26.0–34.0)
MCHC: 34.1 g/dL (ref 30.0–36.0)
MCV: 90.4 fL (ref 80.0–100.0)
Monocytes Absolute: 1 10*3/uL (ref 0.1–1.0)
Monocytes Relative: 15 %
Neutro Abs: 4.4 10*3/uL (ref 1.7–7.7)
Neutrophils Relative %: 65 %
Platelets: 145 10*3/uL — ABNORMAL LOW (ref 150–400)
RBC: 4.7 MIL/uL (ref 4.22–5.81)
RDW: 13.1 % (ref 11.5–15.5)
WBC: 6.8 10*3/uL (ref 4.0–10.5)
nRBC: 0 % (ref 0.0–0.2)

## 2023-12-18 LAB — GLUCOSE, CAPILLARY
Glucose-Capillary: 178 mg/dL — ABNORMAL HIGH (ref 70–99)
Glucose-Capillary: 250 mg/dL — ABNORMAL HIGH (ref 70–99)

## 2023-12-18 LAB — PHOSPHORUS: Phosphorus: 4.7 mg/dL — ABNORMAL HIGH (ref 2.5–4.6)

## 2023-12-18 LAB — MAGNESIUM: Magnesium: 2 mg/dL (ref 1.7–2.4)

## 2023-12-18 MED ORDER — INSULIN ASPART 100 UNIT/ML IJ SOLN
4.0000 [IU] | Freq: Three times a day (TID) | INTRAMUSCULAR | Status: DC
  Administered 2023-12-18: 4 [IU] via SUBCUTANEOUS
  Filled 2023-12-18: qty 1

## 2023-12-18 MED ORDER — FUROSEMIDE 20 MG PO TABS: 20.0000 mg | ORAL_TABLET | Freq: Every day | ORAL | 0 refills | Status: DC

## 2023-12-18 MED ORDER — AMIODARONE HCL 200 MG PO TABS: 200.0000 mg | ORAL_TABLET | Freq: Every day | ORAL | 0 refills | Status: DC

## 2023-12-18 MED ORDER — LACTULOSE 10 GM/15ML PO SOLN
10.0000 g | Freq: Two times a day (BID) | ORAL | Status: DC
  Administered 2023-12-18: 10 g via ORAL
  Filled 2023-12-18: qty 30

## 2023-12-18 MED ORDER — FUROSEMIDE 20 MG PO TABS: 20.0000 mg | ORAL_TABLET | Freq: Every day | ORAL | Status: DC

## 2023-12-18 NOTE — Discharge Summary (Signed)
 Physician Discharge Summary   Patient: [name] MRN: [id number] DOB: 06-12-1956  Admit date:     12/16/2023  Discharge date: 12/18/23  Discharge Physician: [name]   PCP: [name]   Recommendations at discharge:   Follow-up with primary care this week to recheck kidney function and ensure that it is trending to normal Follow-up with cardiology for established appointment on 5/26 to discuss changes to amiodarone  and addition of daily Lasix   Discharge Diagnoses: Acute heart failure exacerbation CAD Paroxysmal A-fib AKI on CKD stage III Renal transplant patient Diabetes with hyperglycemia BPH Degenerative disc disease OSA Hypertension BMI 27  Hospital Course: [name] is a 68 y.o. male with medical history significant of aortic atherosclerosis, arthritis, BPH, carotid artery disease, CAD with prior CABG, degenerative disc disease, CKD status post renal transplant, A-fib, diabetes, OSA on CPAP, hypertension, heart failure with reduced EF. Patient presented to his PCP complaining of weight gain, abdominal bloating, orthopnea.  He was found to be volume overloaded. Given his tenuous renal function it was appropriately recommended he present to the hospital for diuresis under observation. In the ED creatinine was found to be elevated above baseline, chest x-ray with vascular congestion, BNP 685.  Patient was started on IV Lasix . As anticipated, renal function did worsen slightly.  Nephrology was consulted.  By 5/18 patient had diuresed over 3 L.  On reevaluation he endorsed feeling much improved.  He had no further pedal edema, significantly improved orthopnea, and abdominal bloating had mostly resolved.  Kidney function began downtrending though not entirely to baseline. Given patient's heart failure history and report of chronic abdominal bloating, I do suspect he will require at least [name]-dose diuretic daily.  He was started on 20 mg p.o. Lasix  daily.   He was instructed on when to take extra dose. Patient has established follow-up with cardiology already in the clinic in 1 week.  He follows closely with [facility] nephrology as well.  We discussed the importance of following up to recheck kidney function and ensure that it has returned towards normal.  He was also given ER return precautions including decreasing urinary output.   Given his creatinine downtrend today and the tapering of diuretics, we anticipate that his creatinine will continue to resolve to normal.  Nephrology reviewed and agrees with plan to discharge on [name]-dose diuretic. On day of discharge care plan was discussed extensively with the patient, his wife, and his daughter at bedside.  They are all in agreement.   Acute heart failure exacerbation CAD s/p  CABG 2007 -cardiac cath on 11/08/2023: EF 45% at that time, LIMA to LAD and SVG to PDA patent. - Has diuresed over 3 L on IV Lasix  twice daily. - Discontinue IV Lasix .  Start [name]-dose 20 mg p.o. Lasix  daily - Instructed patient when to take additional dose of Lasix  outpatient - Has outpatient follow-up with cardiology 5/26, instructed to keep appointment and review diuretic needs  Paroxysmal A-fib - At home taking amiodarone  400 mg once daily.  Have confirmed on prior cardiology visit that this dose is correct. - Patient persistently bradycardic in the 40s to 50s.  Have decreased amiodarone  to 200 mg.  New Rx called into pharmacy - Continue home dose Eliquis  - Outpatient follow-up with cardiology 5/26   AKI on CKD3a Status post renal transplant 2016 - Baseline creatinine 1.2.  1.55 on arrival, peaked at 1.88.  Downtrending now. - Acute worsening secondary to IV diuresis. - Nephrology consulted. - Discontinued IV diuresis  as above.  Initiated on [name]-dose 20 mg Lasix  daily - Follow-up with PCP for recheck creatinine within a week.  If creatinine has not down trended to normal may need to discontinue diuretics entirely and resume on  a PRN basis - Continue outpatient follow-up with [facility] nephrology - Continue immunosuppressants. - Hold losartan  until creatinine resolved to normal   Diabetes with hyperglycemia, well-controlled - Hemoglobin A1c 6.6% - Resume home meds at discharge   BPH - Continue home meds - Follows with urology, underwent UroLift November 2024. - Continue to monitor closely for urinary retention.  Has had excellent output so far   Degenerative disc disease - Resume home meds - Worked with physical therapy today, and PAC score: 23.  No skilled needs   OSA - Ordered CPAP at night   Hypertension - Hold home dose losartan  for now given AKI.  As needed hydralazine  and labetalol . - Goal BP 130/80. - Follow closely titrate as needed   BMI 25      Consultants: Nephrology Procedures performed: n/a  Disposition: Home Diet recommendation:  Discharge Diet Orders (From admission, onward)     Start     Ordered   12/18/23 0000  Diet general        12/18/23 1233           Renal diet DISCHARGE MEDICATION: Allergies as of 12/18/2023       Reactions   Oxybutynin  Other (See Comments)   Incomplete bladder emptying        Medication List     PAUSE taking these medications    losartan  100 MG tablet Wait to take this until: Dec 20, 2023 Commonly known as: Cozaar  Take 1 tablet (100 mg total) by mouth daily.       TAKE these medications    amiodarone  200 MG tablet Commonly known as: PACERONE  Take 1 tablet (200 mg total) by mouth daily. Start taking on: Dec 19, 2023 What changed:  medication strength how much to take   ascorbic acid 500 MG tablet Commonly known as: VITAMIN C Take 500 mg by mouth daily.   aspirin  EC 81 MG tablet Take 1 tablet (81 mg total) by mouth daily at 6 (six) AM.   atorvastatin  40 MG tablet Commonly known as: LIPITOR Take 40 mg by mouth at bedtime.   CellCept  250 MG capsule Generic drug: mycophenolate  Take 500 mg by mouth 2 (two) times daily.    Eliquis  5 MG Tabs tablet Generic drug: apixaban  Take 5 mg by mouth 2 (two) times daily.   finasteride  5 MG tablet Commonly known as: PROSCAR  TAKE 1 TABLET BY MOUTH ONCE DAILY.   FreeStyle Libre 2 Sensor Misc by Does not apply route.   furosemide  20 MG tablet Commonly known as: LASIX  Take 1 tablet (20 mg total) by mouth daily. Start taking on: Dec 19, 2023   HumaLOG KwikPen 100 UNIT/ML KwikPen Generic drug: insulin  lispro Inject 10-12 Units into the skin 3 (three) times daily. Inject 10 units daily at breakfast, 10 units at lunch and up to 10 units or less at supper per SSI   Insulin  Pen Needle 33G X 4 MM Misc To use with insulin  pen 4 times per day. E 13.9   Lantus  SoloStar 100 UNIT/ML Solostar Pen Generic drug: insulin  glargine Inject 24 Units into the skin at bedtime.   linagliptin 5 MG Tabs tablet Commonly known as: TRADJENTA Take 5 mg by mouth daily.   nitroGLYCERIN  0.4 MG SL tablet Commonly known as: Nitrostat   Place 1 tablet (0.4 mg total) under the tongue every 5 (five) minutes as needed for chest pain.   omeprazole 20 MG capsule Commonly known as: PRILOSEC Take 20 mg by mouth daily.   Prograf  1 MG capsule Generic drug: tacrolimus  Take 1 mg by mouth every morning.   tacrolimus  0.5 MG capsule Commonly known as: PROGRAF  Take 0.5 mg by mouth at bedtime.   tamsulosin  0.4 MG Caps capsule Commonly known as: FLOMAX  Take 0.8 mg by mouth daily.   Vitamin D 50 MCG (2000 UT) Caps Take 2,000 Units by mouth daily.        Discharge Exam: Filed Weights   12/16/23 1002 12/17/23 2115 12/18/23 0519  Weight: 81.2 kg 77.2 kg 76.9 kg   Constitutional:  Normal appearance. Non toxic-appearing.  HENT: Head Normocephalic and atraumatic.  Mucous membranes are moist.  Eyes:  Extraocular intact. Conjunctivae normal. Pupils are equal, round, and reactive to light.  Cardiovascular: Rate and Rhythm: Normal rate and regular rhythm.  Pulmonary: Non labored, symmetric rise of  chest wall.  No crackles Abdomen: Soft, nontender Musculoskeletal:  Normal range of motion.  No pedal edema Skin: warm and dry. not jaundiced.  Neurological: No focal deficit present. alert. Oriented. Psychiatric: Mood and Affect congruent.    Condition at discharge: stable  Discharge Instructions     Call [name] for:  difficulty breathing, headache or visual disturbances   Complete by: As directed    Call [name] for:  persistant dizziness or light-headedness   Complete by: As directed    Call [name] for:  persistant nausea and vomiting   Complete by: As directed    Call [name] for:  severe uncontrolled pain   Complete by: As directed    Call [name] for:  temperature >100.4   Complete by: As directed    Diet general   Complete by: As directed    Discharge instructions   Complete by: As directed    You were admitted for volume overload due to heart failure.  You are being sent home on a diuretic daily.  This is a [name]-dose diuretic and you may not needed every day but for now we recommend you continue taking daily.  As we discussed you may find that you occasionally need an extra dose of Lasix .  If you find that your weight has gone up over 4 pounds, take an extra dose of Lasix .  If you are finding that your abdominal bloating or lower extremity swelling is getting worse, take an extra dose of Lasix .  If you anticipate a high salt meal, take an extra dose of Lasix .  We have decreased your dose of amiodarone  as your pulse here was consistently under 50 and too [name] to tolerate 400 mg. Please follow-up with your cardiologist to discuss this.  Your kidney function is slightly worse than your baseline, though improving.  Please follow-up with your primary care physician this week to recheck your creatinine and ensure that it is returning towards normal.  If you begin experiencing shortness of breath, decreased urine output, chest pain, please return emergently to the ED   Increase activity slowly   Complete  by: As directed         The results of significant diagnostics from this hospitalization (including imaging, microbiology, ancillary and laboratory) are listed below for reference.   Imaging Studies: DG Chest 2 View Result Date: 12/16/2023 CLINICAL DATA:  Shortness of breath EXAM: CHEST - 2 VIEW COMPARISON:  05/20/2020 x-ray.  Abdomen pelvis  CT 05/19/2020 FINDINGS: Status post median sternotomy. Pleural thickening along the right lung base. This opacity appears slightly increased from previous. Component of effusion is possible. Adjacent linear opacities identified favoring scar atelectasis. Borderline cardiopericardial silhouette. Slight prominence of central vasculature. No pneumothorax. No new consolidation. No left-sided effusion. Degenerative changes along the spine. IMPRESSION: Postop chest.  Borderline size heart with some vascular congestion. Blunting of the right costophrenic angle could be pleural thickening but is more than the prior x-ray. A component of effusion is possible. Adjacent bandlike opacity. Electronically Signed   By: Adrianna Horde M.D.   On: 12/16/2023 10:28    Microbiology: Results for orders placed or performed in visit on 05/11/23  CULTURE, URINE COMPREHENSIVE     Status: None   Collection Time: 05/11/23 11:38 AM   Specimen: Urine   UR  Result Value Ref Range Status   Urine Culture, Comprehensive Final report  Final   Organism ID, Bacteria Comment  Final    Comment: Mixed urogenital flora 10,000-25,000 colony forming units per mL   Microscopic Examination     Status: None   Collection Time: 05/11/23 11:38 AM   Urine  Result Value Ref Range Status   WBC, UA 0-5 0 - 5 /hpf Final   RBC, Urine 0-2 0 - 2 /hpf Final   Epithelial Cells (non renal) 0-10 0 - 10 /hpf Final   Bacteria, UA Few None seen/Few Final    Labs: CBC: Recent Labs  Lab 12/16/23 1010 12/17/23 0553 12/18/23 0349  WBC 7.1 7.0 6.8  NEUTROABS  --  5.0 4.4  HGB 14.2 14.6 14.5  HCT 44.1  44.3 42.5  MCV 94.0 92.3 90.4  PLT 148* 160 145*   Basic Metabolic Panel: Recent Labs  Lab 12/16/23 1010 12/17/23 0553 12/18/23 0349  NA 136 138 139  K 4.2 4.5 3.9  CL 104 104 103  CO2 22 26 26   GLUCOSE 114* 118* 105*  BUN 26* 34* 41*  CREATININE 1.55* 1.88* 1.71*  CALCIUM  8.3* 8.4* 8.4*  MG 2.0 2.0 2.0  PHOS  --  4.4 4.7*   Liver Function Tests: Recent Labs  Lab 12/17/23 0553 12/18/23 0349  AST 19 17  ALT 18 18  ALKPHOS 37* 38  BILITOT 1.2 1.2  PROT 6.9 6.7  ALBUMIN 3.7 3.6   CBG: Recent Labs  Lab 12/17/23 1611 12/17/23 1704 12/17/23 2322 12/18/23 0752 12/18/23 1131  GLUCAP 131* 166* 227* 178* 250*    Discharge time spent: 32 minutes.  Signed: Mayreli Alden, DO [facility] 12/18/2023

## 2023-12-18 NOTE — Progress Notes (Signed)
 [facility]  PROGRESS NOTE   Subjective:   Feels well. Family at bed side  Objective:  Vital signs: Blood pressure (!) 168/61, pulse (!) 53, temperature 98 F (36.7 C), temperature source Oral, resp. rate 20, height 5\' 8"  (1.727 m), weight 76.9 kg, SpO2 92%.  Intake/Output Summary (Last 24 hours) at 12/18/2023 1202 Last data filed at 12/18/2023 1036 Gross per 24 hour  Intake 440 ml  Output 700 ml  Net -260 ml   Filed Weights   12/16/23 1002 12/17/23 2115 12/18/23 0519  Weight: 81.2 kg 77.2 kg 76.9 kg     Physical Exam: General:  No acute distress  Head:  Normocephalic, atraumatic. Moist oral mucosal membranes  Eyes:  Anicteric  Neck:  Supple  Lungs:   Clear to auscultation, normal effort  Heart:  S1S2 no rubs  Abdomen:   Soft, nontender, bowel sounds present  Extremities:  peripheral edema.  Neurologic:  Awake, alert, following commands  Skin:  No lesions  Access:     Basic Metabolic Panel: Recent Labs  Lab 12/16/23 1010 12/17/23 0553 12/18/23 0349  NA 136 138 139  K 4.2 4.5 3.9  CL 104 104 103  CO2 22 26 26   GLUCOSE 114* 118* 105*  BUN 26* 34* 41*  CREATININE 1.55* 1.88* 1.71*  CALCIUM  8.3* 8.4* 8.4*  MG 2.0 2.0 2.0  PHOS  --  4.4 4.7*   GFR: Estimated Creatinine Clearance: 39.4 mL/min (A) (by C-G formula based on SCr of 1.71 mg/dL (H)).  Liver Function Tests: Recent Labs  Lab 12/17/23 0553 12/18/23 0349  AST 19 17  ALT 18 18  ALKPHOS 37* 38  BILITOT 1.2 1.2  PROT 6.9 6.7  ALBUMIN 3.7 3.6   No results for input(s): "LIPASE", "AMYLASE" in the last 168 hours. No results for input(s): "AMMONIA" in the last 168 hours.  CBC: Recent Labs  Lab 12/16/23 1010 12/17/23 0553 12/18/23 0349  WBC 7.1 7.0 6.8  NEUTROABS  --  5.0 4.4  HGB 14.2 14.6 14.5  HCT 44.1 44.3 42.5  MCV 94.0 92.3 90.4  PLT 148* 160 145*     HbA1C: Hemoglobin A1C  Date/Time Value Ref Range Status  06/09/2023 12:00 AM 7  Final   Hgb A1c MFr Bld   Date/Time Value Ref Range Status  12/16/2023 09:08 PM 6.6 (H) 4.8 - 5.6 % Final    Comment:    (NOTE) Pre diabetes:          5.7%-6.4%  Diabetes:              >6.4%  Glycemic control for   <7.0% adults with diabetes   04/27/2020 06:52 AM 6.5 (H) 4.8 - 5.6 % Final    Comment:    (NOTE) Pre diabetes:          5.7%-6.4%  Diabetes:              >6.4%  Glycemic control for   <7.0% adults with diabetes     Urinalysis: No results for input(s): "COLORURINE", "LABSPEC", "PHURINE", "GLUCOSEU", "HGBUR", "BILIRUBINUR", "KETONESUR", "PROTEINUR", "UROBILINOGEN", "NITRITE", "LEUKOCYTESUR" in the last 72 hours.  Invalid input(s): "APPERANCEUR"    Imaging: No results found.   Medications:     amiodarone   200 mg Oral Daily   apixaban   5 mg Oral BID   aspirin  EC  81 mg Oral Q0600   atorvastatin   40 mg Oral QHS   finasteride   5 mg Oral Daily   [START ON 12/19/2023] furosemide   20 mg Oral Daily   insulin  aspart  4 Units Subcutaneous TID WC   lactulose  10 g Oral BID   mycophenolate   500 mg Oral BID   pantoprazole   40 mg Oral Daily   tacrolimus   0.5 mg Oral QHS   tacrolimus   1 mg Oral BH-q7a   tamsulosin   0.8 mg Oral QPM    Assessment/ Plan:     68 y.o. male with a PMHx of renal transplantation 10 years ago at [facility].  Patient also has history of hypertension, coronary artery disease, congestive heart failure, coronary artery bypass graft surgery, atrial fibrillation, diabetes, obstructive sleep apnea on CPAP, hyperlipidemia and benign prostatic hypertrophy.  He is now admitted with history of edema, shortness of breath and dyspnea on exertion and found to have congestive heart failure.      Principal Problem:   Acute exacerbation of CHF (congestive heart failure) (HCC)     #1: Renal transplant: Patient had renal transplant about 10 years ago at [facility]. He has been on tacrolimus  and CellCept  and will continue the same.  Advised to follow up with Renal at  [facility].   #2: Acute kidney injury on chronic kidney disease: Patient has a baseline creatinine of 1.3-1.5.  The acute kidney injury is most likely secondary to diuresis complicated by decreased cardiac output due to CHF.   #3: Congestive heart failure: Can discharge on oral diuretics.  Patient is advised on importance of fluid restriction.   #4: Diabetes: Continue insulin  protocol.   #5: Hypertension: Losartan  is on hold secondary to acute kidney injury.  Will continue hydralazine  and labetalol  and maintain on 2 g salt restricted diet.    Labs and medications reviewed.   LOS: 2 Worthy Heads, MD [facility] 5/18/202512:02 PM

## 2023-12-18 NOTE — Plan of Care (Signed)
  Problem: Education: Goal: Ability to describe self-care measures that may prevent or decrease complications (Diabetes Survival Skills Education) will improve Outcome: Progressing   Problem: Coping: Goal: Ability to adjust to condition or change in health will improve Outcome: Progressing   Problem: Fluid Volume: Goal: Ability to maintain a balanced intake and output will improve Outcome: Progressing   Problem: Clinical Measurements: Goal: Cardiovascular complication will be avoided Outcome: Progressing   Problem: Elimination: Goal: Will not experience complications related to urinary retention Outcome: Progressing   Problem: Cardiac: Goal: Ability to achieve and maintain adequate cardiopulmonary perfusion will improve Outcome: Progressing  Plan of care ongoing, see MAR see flowsheet

## 2023-12-19 ENCOUNTER — Other Ambulatory Visit: Payer: Self-pay | Admitting: Physician Assistant

## 2023-12-19 ENCOUNTER — Ambulatory Visit (INDEPENDENT_AMBULATORY_CARE_PROVIDER_SITE_OTHER): Admitting: Physician Assistant

## 2023-12-19 ENCOUNTER — Telehealth: Payer: Self-pay

## 2023-12-19 ENCOUNTER — Ambulatory Visit: Admitting: Cardiovascular Disease

## 2023-12-19 VITALS — BP 173/62 | HR 67

## 2023-12-19 DIAGNOSIS — R102 Pelvic and perineal pain: Secondary | ICD-10-CM | POA: Diagnosis not present

## 2023-12-19 DIAGNOSIS — N401 Enlarged prostate with lower urinary tract symptoms: Secondary | ICD-10-CM | POA: Diagnosis not present

## 2023-12-19 DIAGNOSIS — N138 Other obstructive and reflux uropathy: Secondary | ICD-10-CM | POA: Diagnosis not present

## 2023-12-19 DIAGNOSIS — R339 Retention of urine, unspecified: Secondary | ICD-10-CM | POA: Diagnosis not present

## 2023-12-19 LAB — URINALYSIS, COMPLETE
Bilirubin, UA: NEGATIVE
Glucose, UA: NEGATIVE
Leukocytes,UA: NEGATIVE
Nitrite, UA: NEGATIVE
Protein,UA: NEGATIVE
RBC, UA: NEGATIVE
Specific Gravity, UA: 1.015 (ref 1.005–1.030)
Urobilinogen, Ur: 1 mg/dL (ref 0.2–1.0)
pH, UA: 6 (ref 5.0–7.5)

## 2023-12-19 LAB — MICROSCOPIC EXAMINATION

## 2023-12-19 LAB — BLADDER SCAN AMB NON-IMAGING: Scan Result: 0

## 2023-12-19 MED ORDER — MELOXICAM 7.5 MG PO TABS: 7.5000 mg | ORAL_TABLET | Freq: Every day | ORAL | 0 refills | Status: DC

## 2023-12-19 NOTE — Progress Notes (Signed)
 12/19/2023 9:54 AM   Beckie Bow [birth year] [id number]  CC: Chief Complaint  Patient presents with   Follow-up   HPI: [name] is a 68 y.o. male with PMH kidney transplant, BPH with LUTS s/p UroLift with Dr. Ace Holder in November 2024 on finasteride , Flomax , and Sanctura  who presents today for evaluation of possible urinary retention.  He was discharged from the hospital yesterday with a heart failure exacerbation, during which he was aggressively diuresed.  He was noted to have excellent urinary output during that admission and his home BPH meds were continued.  Today he reports perineal pain, frequency, urgency, and difficulty urinating.  He is having some constipation since being in the hospital.  In-office UA today positive for trace ketones; urine microscopy pan negative. PVR 0mL.  PMH: Past Medical History:  Diagnosis Date   Aortic atherosclerosis (HCC)    Arthritis    BPH (benign prostatic hyperplasia)    Carotid arterial disease (HCC)    a.) s/p LEFT CEA on 09/06/2019   Coronary artery disease    a.) MI with stents x 2 in 2004; b.) MI with stent x 1 2005; c.) s/p CABG x 2 06/02/2006 (LIMA-LAD, SVG-PDA); d.) LHC/PCI 08/25/2007: 80% oD1 (2.5 x 12 mm Xience V DES)   DDD (degenerative disc disease), lumbar    Dyspnea    ESRD (end stage renal disease) (HCC)    a.) s/p RIGHT renal 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/2015   GERD (gastroesophageal reflux disease)    History of blood transfusion    Hypertension    Long term current use of aspirin     Long term current use of immunosuppressive drug    a.) mycophenolate  + tacrolimus    Myocardial infarction [facility]) 2004   a.) details unclear; stents x 2 (unknown type/location)   Myocardial infarction (HCC) 2005   a.) details unclear; stent x 1 (unknown type/location)   On apixaban  therapy    PAF (paroxysmal atrial fibrillation) (HCC)    a.) CHA2DS2-VASc = 5 (age, HTN, vascular disease history/MI, T2DM) as of 06/10/2023; b.) cardiac  rate/rhythm maintained intrinsically without pharmacological intervention; chronically anticoagulated using apixaban    Sepsis due to gram-negative UTI (HCC) 04/27/2020   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   From DM and HTN, f/u with [facility] every 6 months   T2DM (type 2 diabetes mellitus) (HCC)    Unstable angina (HCC)    Vitamin D deficiency     Surgical History: Past Surgical History:  Procedure Laterality Date   CATARACT EXTRACTION Bilateral    CATARACT EXTRACTION W/PHACO Right 02/03/2017   Procedure: CATARACT EXTRACTION PHACO AND INTRAOCULAR LENS PLACEMENT (IOC);  Surgeon: Annell Kidney, MD;  Location: [facility] ORS;  Service: Ophthalmology;  Laterality: Right;  US  00:35.8 AP% 12.8 CDE 4.57 Fluid lot # [id number]   COLONOSCOPY WITH PROPOFOL  N/A 12/30/2021   Procedure: COLONOSCOPY WITH PROPOFOL ;  Surgeon: Luke Salaam, MD;  Location: [facility] ENDOSCOPY;  Service: Gastroenterology;  Laterality: N/A;   CORONARY ANGIOPLASTY WITH STENT PLACEMENT Left 2004   stents x 2   CORONARY ANGIOPLASTY WITH STENT PLACEMENT Left 2005   stent x 1   CORONARY ANGIOPLASTY WITH STENT PLACEMENT Left 08/25/2007   Procedure: CORONARY ANGIOPLASTY WITH STENT PLACEMENT; Location: [facility]; Surgeon: Jama Mayotte, MD   CORONARY ARTERY BYPASS GRAFT N/A 06/08/2006   Procedure: CORONARY ARTERY BYPASS GRAFT; Location: [facility]; Surgeon: Dorethea Ganong, MD   CYSTOSCOPY WITH INSERTION OF UROLIFT N/A 06/13/2023   Procedure: CYSTOSCOPY WITH INSERTION OF UROLIFT;  Surgeon: Dustin Gimenez, MD;  Location: [facility] ORS;  Service: Urology;  Laterality: N/A;   ENDARTERECTOMY Left 09/06/2019   Procedure: ENDARTERECTOMY CAROTID;  Surgeon: Celso College, MD;  Location: [facility] ORS;  Service: Vascular;  Laterality: Left;   HIP FRACTURE SURGERY     KIDNEY TRANSPLANT Right 05/10/2015   LEFT HEART CATH AND CORONARY ANGIOGRAPHY Left 05/20/2006   Procedure: LEFT HEART CATH AND CORONARY ANGIOGRAPHY; Location: [facility]; Surgeon: Debborah Fairly, MD    LEFT HEART CATH AND CORONARY ANGIOGRAPHY Left 11/08/2023   Procedure: LEFT HEART CATH AND CORONARY ANGIOGRAPHY with possible intervention;  Surgeon: Cherrie Cornwall, MD;  Location: [facility] INVASIVE CV LAB;  Service: Cardiovascular;  Laterality: Left;   LEFT HEART CATH AND CORS/GRAFTS ANGIOGRAPHY Left 08/10/2007   Procedure: LEFT HEART CATH AND CORS/GRAFTS ANGIOGRAPHY; Location: [facility]; Surgeon: Debborah Fairly, MD   LEFT HEART CATH AND CORS/GRAFTS ANGIOGRAPHY Left 05/04/2011   Procedure: LEFT HEART CATH AND CORS/GRAFTS ANGIOGRAPHY; Location: [facility]; Surgeon: Debborah Fairly, MD    Home Medications:  Allergies as of 12/19/2023       Reactions   Oxybutynin  Other (See Comments)   Incomplete bladder emptying        Medication List        Accurate as of Dec 19, 2023  9:54 AM. If you have any questions, ask your nurse or doctor.          PAUSE taking these medications    losartan  100 MG tablet Wait to take this until: Dec 20, 2023 Commonly known as: Cozaar  Take 1 tablet (100 mg total) by mouth daily.       TAKE these medications    amiodarone  200 MG tablet Commonly known as: PACERONE  Take 1 tablet (200 mg total) by mouth daily.   ascorbic acid 500 MG tablet Commonly known as: VITAMIN C Take 500 mg by mouth daily.   aspirin  EC 81 MG tablet Take 1 tablet (81 mg total) by mouth daily at 6 (six) AM.   atorvastatin  40 MG tablet Commonly known as: LIPITOR Take 40 mg by mouth at bedtime.   CellCept  250 MG capsule Generic drug: mycophenolate  Take 500 mg by mouth 2 (two) times daily.   Eliquis  5 MG Tabs tablet Generic drug: apixaban  Take 5 mg by mouth 2 (two) times daily.   finasteride  5 MG tablet Commonly known as: PROSCAR  TAKE 1 TABLET BY MOUTH ONCE DAILY.   FreeStyle Libre 2 Sensor Misc by Does not apply route.   furosemide  20 MG tablet Commonly known as: LASIX  Take 1 tablet (20 mg total) by mouth daily.   HumaLOG KwikPen 100 UNIT/ML KwikPen Generic drug: insulin   lispro Inject 10-12 Units into the skin 3 (three) times daily. Inject 10 units daily at breakfast, 10 units at lunch and up to 10 units or less at supper per SSI   Insulin  Pen Needle 33G X 4 MM Misc To use with insulin  pen 4 times per day. E 13.9   Lantus  SoloStar 100 UNIT/ML Solostar Pen Generic drug: insulin  glargine Inject 24 Units into the skin at bedtime.   linagliptin 5 MG Tabs tablet Commonly known as: TRADJENTA Take 5 mg by mouth daily.   nitroGLYCERIN  0.4 MG SL tablet Commonly known as: Nitrostat  Place 1 tablet (0.4 mg total) under the tongue every 5 (five) minutes as needed for chest pain.   omeprazole 20 MG capsule Commonly known as: PRILOSEC Take 20 mg by mouth daily.   Prograf  1 MG capsule Generic drug: tacrolimus  Take 1 mg by mouth every morning.  tacrolimus  0.5 MG capsule Commonly known as: PROGRAF  Take 0.5 mg by mouth at bedtime.   tamsulosin  0.4 MG Caps capsule Commonly known as: FLOMAX  Take 0.8 mg by mouth daily.   Vitamin D 50 MCG (2000 UT) Caps Take 2,000 Units by mouth daily.        Allergies:  Allergies  Allergen Reactions   Oxybutynin  Other (See Comments)    Incomplete bladder emptying    Family History: Family History  Problem Relation Age of Onset   Osteoporosis Mother    Drug abuse Sister    Drug abuse Brother     Social History:   reports that he has quit smoking. He has never used smokeless tobacco. He reports that he does not drink alcohol and does not use drugs.  Physical Exam: BP (!) 173/62   Pulse 67   Constitutional:  Alert and oriented, no acute distress, nontoxic appearing HEENT: [place], AT Cardiovascular: No clubbing, cyanosis, or edema Respiratory: Normal respiratory effort, no increased work of breathing GU: Normal sphincter tone, smooth, symmetrically enlarged 40+ cc prostate without nodules or induration.  No bogginess or marked tenderness. Skin: No rashes, bruises or suspicious lesions Neurologic: Grossly  intact, no focal deficits, moving all 4 extremities Psychiatric: Normal mood and affect  Laboratory Data: Results for orders placed or performed in visit on 12/19/23  Microscopic Examination   Collection Time: 12/19/23  9:39 AM   Urine  Result Value Ref Range   WBC, UA 0-5 0 - 5 /hpf   RBC, Urine 0-2 0 - 2 /hpf   Epithelial Cells (non renal) 0-10 0 - 10 /hpf   Casts Present (A) None seen /lpf   Cast Type Hyaline casts N/A   Bacteria, UA Few None seen/Few  Urinalysis, Complete   Collection Time: 12/19/23  9:39 AM  Result Value Ref Range   Specific Gravity, UA 1.015 1.005 - 1.030   pH, UA 6.0 5.0 - 7.5   Color, UA Yellow Yellow   Appearance Ur Clear Clear   Leukocytes,UA Negative Negative   Protein,UA Negative Negative/Trace   Glucose, UA Negative Negative   Ketones, UA Trace (A) Negative   RBC, UA Negative Negative   Bilirubin, UA Negative Negative   Urobilinogen, Ur 1.0 0.2 - 1.0 mg/dL   Nitrite, UA Negative Negative   Microscopic Examination Comment    Microscopic Examination See below:   BLADDER SCAN AMB NON-IMAGING   Collection Time: 12/19/23  9:49 AM  Result Value Ref Range   Scan Result 0 ml    Assessment & Plan:   1. Perineal pain in male (Primary) UA bland, PVR WNL, and no significant findings on DRE.  Symptoms are consistent with prostatitis, but more so suspect pelvic floor dysfunction.  Will have him continue Flomax  twice daily and finasteride  daily, stop trospium  for the next 2 weeks, start MiraLAX to help with his bowels.  We discussed Mobic  for 7 days, however this is contraindicated in the setting of his renal transplant.  I also gave him pelvic stretches in his AVS. - Urinalysis, Complete - BLADDER SCAN AMB NON-IMAGING - Microscopic Examination   Return if symptoms worsen or fail to improve.  Kathreen Pare, PA-C  [facility] [place] [address], [address] [place], [place] [address] [phone]

## 2023-12-19 NOTE — Telephone Encounter (Signed)
 Pt's daughter Russ Course LM on triage line stating that the patient is having difficulty voiding and sharp abdominal pain. She questions if we can see the patient in office today.   Called pt he states that he has only been able to void with a dribble this morning. He is now having lower abdominal pain. Pt scheduled to come into clinic for evaluation within the hour. Pt voiced understanding.

## 2023-12-19 NOTE — Patient Instructions (Addendum)
 CONTINUE Flomax  (tamsulosin ) twice daily and Proscar  (finasteride ) once daily. STOP Sanctura  (trospium ) for the next 2 weeks, then you may restart it. START Miralax powder laxative (available over-the-counter), take one capful once daily mixed in with a beverage of your choice to promote bowel movements. START Mobic  (meloxicam ) once daily for 7 days.

## 2023-12-20 ENCOUNTER — Encounter (INDEPENDENT_AMBULATORY_CARE_PROVIDER_SITE_OTHER): Payer: Self-pay

## 2023-12-20 NOTE — Telephone Encounter (Signed)
 Patient needs transition of care appointment from hospital discharge from [facility]. Was hospitalized from 5/16-5/17. Please schedule an appointment with me this week.   Thank you,  Jacklin Mascot, MD

## 2023-12-21 ENCOUNTER — Ambulatory Visit (INDEPENDENT_AMBULATORY_CARE_PROVIDER_SITE_OTHER)

## 2023-12-21 ENCOUNTER — Ambulatory Visit: Payer: Self-pay

## 2023-12-21 VITALS — BP 150/60 | HR 64 | Temp 98.4°F | Ht 68.0 in | Wt 168.4 lb

## 2023-12-21 DIAGNOSIS — I10 Essential (primary) hypertension: Secondary | ICD-10-CM

## 2023-12-21 DIAGNOSIS — K219 Gastro-esophageal reflux disease without esophagitis: Secondary | ICD-10-CM | POA: Diagnosis not present

## 2023-12-21 DIAGNOSIS — E1122 Type 2 diabetes mellitus with diabetic chronic kidney disease: Secondary | ICD-10-CM | POA: Diagnosis not present

## 2023-12-21 DIAGNOSIS — I5022 Chronic systolic (congestive) heart failure: Secondary | ICD-10-CM | POA: Insufficient documentation

## 2023-12-21 DIAGNOSIS — N400 Enlarged prostate without lower urinary tract symptoms: Secondary | ICD-10-CM | POA: Diagnosis not present

## 2023-12-21 DIAGNOSIS — Z940 Kidney transplant status: Secondary | ICD-10-CM

## 2023-12-21 DIAGNOSIS — Z794 Long term (current) use of insulin: Secondary | ICD-10-CM | POA: Diagnosis not present

## 2023-12-21 DIAGNOSIS — N179 Acute kidney failure, unspecified: Secondary | ICD-10-CM

## 2023-12-21 DIAGNOSIS — R0601 Orthopnea: Secondary | ICD-10-CM | POA: Diagnosis not present

## 2023-12-21 DIAGNOSIS — N1831 Chronic kidney disease, stage 3a: Secondary | ICD-10-CM | POA: Diagnosis not present

## 2023-12-21 DIAGNOSIS — Z9289 Personal history of other medical treatment: Secondary | ICD-10-CM

## 2023-12-21 DIAGNOSIS — R140 Abdominal distension (gaseous): Secondary | ICD-10-CM | POA: Diagnosis not present

## 2023-12-21 DIAGNOSIS — J3489 Other specified disorders of nose and nasal sinuses: Secondary | ICD-10-CM | POA: Insufficient documentation

## 2023-12-21 LAB — BASIC METABOLIC PANEL WITH GFR
BUN: 37 mg/dL — ABNORMAL HIGH (ref 6–23)
CO2: 24 meq/L (ref 19–32)
Calcium: 8.8 mg/dL (ref 8.4–10.5)
Chloride: 106 meq/L (ref 96–112)
Creatinine, Ser: 1.58 mg/dL — ABNORMAL HIGH (ref 0.40–1.50)
GFR: 44.37 mL/min — ABNORMAL LOW (ref >60.00)
Glucose, Bld: 201 mg/dL — ABNORMAL HIGH (ref 70–99)
Potassium: 4.3 meq/L (ref 3.5–5.1)
Sodium: 137 meq/L (ref 135–145)

## 2023-12-21 MED ORDER — SALINE NASAL SPRAY 0.65 % NA SOLN: 1.0000 | NASAL | 12 refills | Status: DC | PRN

## 2023-12-21 NOTE — Assessment & Plan Note (Signed)
 Significant improvement in symptoms compared to appointment with me on 12/16/23.  Continue Lasix  20 mg daily.  Check BMP today. Weigh daily or at least three times a week. Report any weight gain of more than 2 kg in 3 days to us  and will need prompt office visit.  Keep appointment with cardiologist Dr. Meredeth Stallion as scheduled.

## 2023-12-21 NOTE — Assessment & Plan Note (Signed)
 Resolved now. Most likely related to CHF exacerbation. Weight today 168 lb, compared to 179 on 12/16/23.

## 2023-12-21 NOTE — Assessment & Plan Note (Signed)
 Seems to be stable on as needed omeprazole 20 mg.  Symptoms correlating with patient eating spicy food.  Counseled on avoiding triggers.  Patient requesting GI referral for potential EGD.  Referral to [facility] clinic made today.

## 2023-12-21 NOTE — Assessment & Plan Note (Addendum)
 Status with cardiologist Dr. Meredeth Stallion, recommend following up with him as scheduled on 12/26/2023.

## 2023-12-21 NOTE — Assessment & Plan Note (Signed)
 Patient was recently hospitalized for exacerbation of CHF.  Was treated with IV and oral Lasix .  Patient was discharged on Lasix  20 mg which she is taking daily.  Recommend repeating renal function today.

## 2023-12-21 NOTE — Patient Instructions (Addendum)
 Weigh yourself daily or at least three times a week. Report any weight gain of more than 2 kg in 3 days to us .   We are updating your kidney function today. I will reach out to you with your result.   You can use saline nose spray twice a day as needed for dry nose.   Please don't take Meloxicam  unless you are having severe pain. Meloxicam  can make your kidney function worse and can make acid reflux problem worse as well. If you need to take Meloxicam  please take this with food.   I am referring you to a GI doctor for evaluation and potential for getting endoscopy (EGD) which is a procedure where the doctor look at your inside using camera from the mouth. But the doctor may not want to do EGD procedure as this requires you to have anesthesia. Given your health issues they may take caution and not do the EGD unless absolutely necessary. If you do not hear from them in 2 weeks to schedule for GI appointment please reach out to our clinic.

## 2023-12-21 NOTE — Assessment & Plan Note (Signed)
 Symptom ongoing for about 3 years.  Patient's symptoms resolves 4 hours after eating which is reassuring.  Differential diagnosis includes IBS, lactose intolerance, aerophagia, SIBO.  Patient requesting GI referral.  GI referral to [facility] clinic made today.  Recommend avoiding spicy food, overeating, eating too fast.

## 2023-12-21 NOTE — Assessment & Plan Note (Signed)
 Recommend continue follow-up with transplant team as scheduled on 01/05/2024.  No new concerns today.  Recommend avoiding nephrotoxic medication.

## 2023-12-21 NOTE — Assessment & Plan Note (Signed)
Stable, continue current treatment. °

## 2023-12-21 NOTE — Progress Notes (Signed)
 Established Patient Office Visit   Subjective  Patient ID: [name], male    DOB: [birth year]  Age: 68 y.o. MRN: [id number]  Chief Complaint  Patient presents with   Hospitalization Follow-up    He  has a past medical history of Acute exacerbation of CHF (congestive heart failure) (HCC) (12/16/2023), Aortic atherosclerosis (HCC), Arthritis, BPH with LUTS s/p UroLift with Dr. Ace Holder in November 2024 on finasteride , Flomax , and Sanctura   and [facility], BPH with LUTS, s/p urolift with Dr. Ace Holder 06/2023, on Finasteride , Flomax , Sancture, sees, Carotid arterial disease (HCC), Chest pain (09/20/2017), Coronary artery disease, DDD (degenerative disc disease), lumbar, Dyspnea, ESRD (end stage renal disease) (HCC), GERD (gastroesophageal reflux disease), History of blood transfusion, Hypertension, Long term current use of aspirin , Long term current use of immunosuppressive drug, Myocardial infarction (HCC) (2004), Myocardial infarction (HCC) (2005), On apixaban  therapy, PAF (paroxysmal atrial fibrillation) (HCC), Sepsis due to gram-negative UTI (HCC) (04/27/2020), Status post RIGHT kidney transplant (05/10/2015), Subconjunctival hemorrhage of right eye (12/15/2023), T2DM (type 2 diabetes mellitus) (HCC), Unstable angina (HCC), and Vitamin D deficiency.  HPI 1) Recent hospitalization   - 12/16/23-12/18/23 at [facility] for CHF exacerbation. Was discharged on Lasix  20 mg daily, needs repeat renal function today.  - Since hospital discharge shortness of breath, orthopnea resolved.   - Appointment with cardiologist Dr. Meredeth Stallion on 12/26/2023.  2)  AKI on CKD3a: Status post right renal 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/10/2015, immunosuppressed:   - Cr improving when discharged from the hospital.   - Baseline Cr 11/11/2023: 1.20, GFR 65   - Has an appointment with transplant team at [facility] on 01/05/2024.   3) OSA: On CPAP. Patient reports he uses it for about 4-6 hours on average. He reports he gets dry mouth  when on CPAP, and takes his mask off. Used to use saline nasal spray in the past which helped.   4) Abdominal bloating, GERD, requesting r/o stomach cancer:  - Patient feels like food gets stuck with any food on the stomach and bloating for about 3 years. He reports he feels this way for around 4 hours after eating food. He denies burping, bad taste in his mouth. He denies bowel movement change. He denies blood in stool. He is concerned about stomach cancer. H/O tracheostomy tube for about 2 months in 2011 when he was admitted for septicemia.   He takes Omeprazole 20 mg prn for GERD (average 2-3 days). GERD symptoms worse with eating spicy food. He was started on Meloxicam  7.5 mg by urologist on 12/19/23 for 7 days which he hasn't taken.   Requesting GI referral.  5) Saw urology on 12/19/23 for perineal pain: Was told no new concerning exam findings.   ROS As per HPI    Objective:      BP (!) 150/60   Pulse 64   Temp 98.4 F (36.9 C) (Oral)   Ht 5\' 8"  (1.727 m)   Wt 168 lb 6.4 oz (76.4 kg)   SpO2 92%   BMI 25.61 kg/m      11/14/2023    9:10 AM  Depression screen PHQ 2/9  Decreased Interest 0  Down, Depressed, Hopeless 0  PHQ - 2 Score 0  Altered sleeping 0  Tired, decreased energy 0  Change in appetite 0  Feeling bad or failure about yourself  0  Trouble concentrating 0  Moving slowly or fidgety/restless 0  Suicidal thoughts 0  PHQ-9 Score 0      11/14/2023  9:10 AM  GAD 7 : Generalized Anxiety Score  Nervous, Anxious, on Edge 0  Control/stop worrying 0  Worry too much - different things 0  Trouble relaxing 0  Restless 0  Easily annoyed or irritable 0  Afraid - awful might happen 0  Total GAD 7 Score 0      11/14/2023    9:10 AM  Depression screen PHQ 2/9  Decreased Interest 0  Down, Depressed, Hopeless 0  PHQ - 2 Score 0  Altered sleeping 0  Tired, decreased energy 0  Change in appetite 0  Feeling bad or failure about yourself  0  Trouble  concentrating 0  Moving slowly or fidgety/restless 0  Suicidal thoughts 0  PHQ-9 Score 0      11/14/2023    9:10 AM  GAD 7 : Generalized Anxiety Score  Nervous, Anxious, on Edge 0  Control/stop worrying 0  Worry too much - different things 0  Trouble relaxing 0  Restless 0  Easily annoyed or irritable 0  Afraid - awful might happen 0  Total GAD 7 Score 0   SDOH Screenings   Food Insecurity: No Food Insecurity (12/17/2023)  Housing: High Risk (12/17/2023)  Transportation Needs: No Transportation Needs (12/17/2023)  Utilities: Not At Risk (12/17/2023)  Depression (PHQ2-9): Low Risk  (11/14/2023)  Financial Resource Strain: Low Risk  (11/30/2023)  Physical Activity: Sufficiently Active (11/30/2023)  Social Connections: Moderately Integrated (12/17/2023)  Stress: No Stress Concern Present (11/30/2023)  Tobacco Use: Medium Risk (12/21/2023)     Physical Exam Constitutional:      General: He is not in acute distress.    Appearance: Normal appearance.  HENT:     Head: Normocephalic and atraumatic.     Nose: No congestion.     Mouth/Throat:     Mouth: Mucous membranes are moist.  Neck:     Thyroid: No thyroid mass or thyroid tenderness.  Cardiovascular:     Rate and Rhythm: Normal rate and regular rhythm.     Heart sounds: Murmur (systolic, grade II) heard.  Pulmonary:     Effort: Pulmonary effort is normal.     Breath sounds: Normal breath sounds. No wheezing or rales.  Abdominal:     General: Bowel sounds are normal. There is no distension.     Palpations: Abdomen is soft.     Tenderness: There is no abdominal tenderness. There is no guarding.  Musculoskeletal:     Cervical back: Neck supple. No rigidity.     Right lower leg: No edema.     Left lower leg: No edema.  Skin:    General: Skin is warm.  Neurological:     Mental Status: He is alert and oriented to person, place, and time.  Psychiatric:        Mood and Affect: Mood normal.        Behavior: Behavior normal.         No results found for any visits on 12/21/23.  The ASCVD Risk score (Arnett DK, et al., 2019) failed to calculate for the following reasons:   The valid total cholesterol range is 130 to 320 mg/dL    Assessment & Plan:  Hospitalization within last 30 days  AKI (acute kidney injury) [facility]) Assessment & Plan: Patient was recently hospitalized for exacerbation of CHF.  Was treated with IV and oral Lasix .  Patient was discharged on Lasix  20 mg which she is taking daily.  Recommend repeating renal function today.   Gastroesophageal reflux disease,  unspecified whether esophagitis present Assessment & Plan: Seems to be stable on as needed omeprazole 20 mg.  Symptoms correlating with patient eating spicy food.  Counseled on avoiding triggers.  Patient requesting GI referral for potential EGD.  Referral to [facility] clinic made today.  Orders: -     Ambulatory referral to Gastroenterology  Dry nose -     Saline Nasal Spray; Place 1 spray into the nose as needed (dry nose related to cpap use).  Dispense: 30 mL; Refill: 12  Essential (primary) hypertension Assessment & Plan: Status with cardiologist Dr. Meredeth Stallion, recommend following up with him as scheduled on 12/26/2023.     Type 2 diabetes mellitus with stage 3a chronic kidney disease, with long-term current use of insulin  [facility]) Assessment & Plan: Stable, continue current treatment.  Orders: -     Basic metabolic panel with GFR  Abdominal bloating Assessment & Plan: Symptom ongoing for about 3 years.  Patient's symptoms resolves 4 hours after eating which is reassuring.  Differential diagnosis includes IBS, lactose intolerance, aerophagia, SIBO.  Patient requesting GI referral.  GI referral to [facility] clinic made today.  Recommend avoiding spicy food, overeating, eating too fast.   Orders: -     Ambulatory referral to Gastroenterology  right renal transplant on 05/10/2015, f/u with [facility] transplant team q6 monthly Assessment &  Plan: Recommend continue follow-up with transplant team as scheduled on 01/05/2024.  No new concerns today.  Recommend avoiding nephrotoxic medication.   Benign prostatic hyperplasia, unspecified whether lower urinary tract symptoms present  Orthopnea Assessment & Plan: Resolved now. Most likely related to CHF exacerbation. Weight today 168 lb, compared to 179 on 12/16/23.    Heart failure with mildly reduced ejection fraction (HFmrEF) (HCC) Assessment & Plan: Significant improvement in symptoms compared to appointment with me on 12/16/23.  Continue Lasix  20 mg daily.  Check BMP today. Weigh daily or at least three times a week. Report any weight gain of more than 2 kg in 3 days to us  and will need prompt office visit.  Keep appointment with cardiologist Dr. Meredeth Stallion as scheduled.    I spent 60 minutes on the day of this face to face encounter reviewing patient's hospital admission, discharge notes, labs, imaging, medication changes, CHF management plan, abdominal bloating, GERD symptoms, management, updating medications, referral to GI reviewing the assessment and plan with patient, and post visit ordering and reviewing of  diagnostics and therapeutics with patient  and his wife.  Return in about 8 weeks (around 02/15/2024) for Chronic follow up.   Jacklin Mascot, MD

## 2023-12-21 NOTE — Assessment & Plan Note (Signed)
-   sodium chloride  (OCEAN) 0.65 % nasal spray; Place 1 spray into the nose as needed (dry nose related to cpap use).  Dispense: 30 mL; Refill: 12 - Consider nasal antibiotic like Bacitracin if above does not improve symptoms.

## 2023-12-22 ENCOUNTER — Telehealth: Payer: Self-pay

## 2023-12-22 DIAGNOSIS — N1831 Chronic kidney disease, stage 3a: Secondary | ICD-10-CM | POA: Diagnosis not present

## 2023-12-22 DIAGNOSIS — I10 Essential (primary) hypertension: Secondary | ICD-10-CM | POA: Diagnosis not present

## 2023-12-22 DIAGNOSIS — E1122 Type 2 diabetes mellitus with diabetic chronic kidney disease: Secondary | ICD-10-CM | POA: Diagnosis not present

## 2023-12-22 DIAGNOSIS — Z794 Long term (current) use of insulin: Secondary | ICD-10-CM | POA: Diagnosis not present

## 2023-12-22 DIAGNOSIS — E1159 Type 2 diabetes mellitus with other circulatory complications: Secondary | ICD-10-CM | POA: Diagnosis not present

## 2023-12-22 NOTE — Patient Instructions (Signed)
 Visit Information  Thank you for taking time to visit with me today. Please don't hesitate to contact me if I can be of assistance to you before our next scheduled telephone appointment.  Our next appointment is by telephone on Thursday May 29th at 12:00pm   Following is a copy of your care plan:   Goals Addressed             This Visit's Progress    VBCI Transitions of Care (TOC) Care Plan       Problems:  Recent Hospitalization for treatment of CHF and DMII  Goal:  Over the next 30 days, the patient will not experience hospital readmission  Interventions:   AFIB Interventions:   Counseled on increased risk of stroke due to Afib and benefits of anticoagulation for stroke prevention Reviewed importance of adherence to anticoagulant exactly as prescribed Counseled on bleeding risk associated with Eliquis  5mg  twice daily and importance of self-monitoring for signs/symptoms of bleeding Counseled on avoidance of NSAIDs due to increased bleeding risk with anticoagulants Counseled on seeking medical attention after a head injury or if there is blood in the urine/stool   Heart Failure Interventions: Provided education on low sodium diet Assessed need for readable accurate scales in home Advised patient to weigh each morning after emptying bladder Discussed importance of daily weight and advised patient to weigh and record daily Reviewed role of diuretics in prevention of fluid overload and management of heart failure; Discussed the importance of keeping all appointments with provider Provided patient with education about the role of exercise in the management of heart failure  Diabetes Interventions: Assessed patient's understanding of A1c goal: <7% Reviewed medications with patient and discussed importance of medication adherence Counseled on importance of regular laboratory monitoring as prescribed Discussed plans with patient for ongoing care management follow up and provided  patient with direct contact information for care management team Assessed social determinant of health barriers Lab Results  Component Value Date   HGBA1C 6.6 (H) 12/16/2023    Patient Self Care Activities:  Attend all scheduled provider appointments Call pharmacy for medication refills 3-7 days in advance of running out of medications Call provider office for new concerns or questions  Notify RN Care Manager of [facility] call rescheduling needs Participate in Transition of Care Program/Attend [facility] scheduled calls Perform all self care activities independently  Take medications as prescribed    Plan:  Telephone follow up appointment with care management team member scheduled for:  Thursday May 29th at 12pm        Patient verbalizes understanding of instructions and care plan provided today and agrees to view in MyChart. Active MyChart status and patient understanding of how to access instructions and care plan via MyChart confirmed with patient.     The patient has been provided with contact information for the care management team and has been advised to call with any health related questions or concerns.   Please call the care guide team at [phone] if you need to cancel or reschedule your appointment.   Please call the Suicide and Crisis Lifeline: 988 call the [place]  National Suicide Prevention Lifeline: (307) 830-9478 or TTY: 740-272-4179 TTY 231-025-7529) to talk to a trained counselor if you are experiencing a Mental Health or Behavioral Health Crisis or need someone to talk to.

## 2023-12-22 NOTE — Transitions of Care (Post Inpatient/ED Visit) (Addendum)
 12/22/2023  Name: [name] MRN: [id number] DOB: [birth year]  Today's TOC FU Call Status: Today's TOC FU Call Status:: Successful TOC FU Call Completed TOC FU Call Complete Date: 12/22/23 Patient's Name and Date of Birth confirmed.  Transition Care Management Follow-up Telephone Call Date of Discharge: 12/18/23 Discharge Facility: [facility]) Type of Discharge: Inpatient Admission Primary Inpatient Discharge Diagnosis:: CHF How have you been since you were released from the hospital?: Better Any questions or concerns?: No  Items Reviewed: Did you receive and understand the discharge instructions provided?: Yes Medications obtained,verified, and reconciled?: Yes (Medications Reviewed) Any new allergies since your discharge?: No Dietary orders reviewed?: Yes Type of Diet Ordered:: Low Sodium Heart Healthy Do you have support at home?: Yes People in Home [RPT]: spouse Name of Support/Comfort Primary Source: Shelagh Derrick  Medications Reviewed Today: Medications Reviewed Today     Reviewed by Claudene Crystal, RN (Case Manager) on 12/22/23 at 1455  Med List Status: <None>   Medication Order Taking? Sig Documenting Provider Last Dose Status Informant  amiodarone  (PACERONE ) 200 MG tablet [id number] No Take 1 tablet (200 mg total) by mouth daily. Dezii, Alexandra, DO Taking Active   ascorbic acid (VITAMIN C) 500 MG tablet [id number] No Take 500 mg by mouth daily. [provider] Taking Active Self, Child  aspirin  EC 81 MG EC tablet [id number] No Take 1 tablet (81 mg total) by mouth daily at 6 (six) AM. Stegmayer, Irven Manson, PA-C Taking Active Self, Child  atorvastatin  (LIPITOR) 40 MG tablet [id number] No Take 40 mg by mouth at bedtime. [provider] Taking Active Self, Child           Med Note Rendell Carrel   Tue Sep 20, 2017  8:44 PM)    CELLCEPT  250 MG capsule [id number] No Take 500 mg by mouth 2 (two) times daily. [provider] Taking Active Self, Child  Cholecalciferol  (VITAMIN D) 50 MCG (2000 UT) CAPS [id number] No Take 2,000 Units by mouth daily. [provider] Taking Active Self, Child  Continuous Blood Gluc Sensor (FREESTYLE LIBRE 2 SENSOR) MISC [id number] No by Does not apply route. [provider] Taking Active Self, Child  ELIQUIS  5 MG TABS tablet [id number] No Take 5 mg by mouth 2 (two) times daily.  [provider] Taking Active Self, Child  finasteride  (PROSCAR ) 5 MG tablet [id number] No TAKE 1 TABLET BY MOUTH ONCE DAILY. Dustin Gimenez, MD Taking Active Self, Child  furosemide  (LASIX ) 20 MG tablet [id number] No Take 1 tablet (20 mg total) by mouth daily. Dezii, Alexandra, DO Taking Active   HUMALOG KWIKPEN 100 UNIT/ML Michaelyn Adu [id number] No Inject 10-12 Units into the skin 3 (three) times daily. Inject 10 units daily at breakfast, 10 units at lunch and up to 10 units or less at supper per SSI [provider] Taking Active Self, Child  Insulin  Pen Needle 33G X 4 MM MISC [id number] No To use with insulin  pen 4 times per day. E 13.9 [provider] Taking Active Self, Child  LANTUS  SOLOSTAR 100 UNIT/ML Solostar Pen [id number] No Inject 24 Units into the skin at bedtime.  [provider] Taking Active Self, Child           Med Note Vivian Groom, MELISSA R   Thu Nov 03, 2023  2:10 PM)    linagliptin (TRADJENTA) 5 MG TABS tablet [id number] No Take 5 mg by mouth daily. [provider] Taking Active Self, Child  losartan  (COZAAR ) 100 MG tablet  [id number] No Take 1 tablet (100 mg total) by mouth daily. Cherrie Cornwall, MD Taking Active Child  meloxicam  (MOBIC ) 7.5 MG tablet [id number] No Take 1 tablet (7.5 mg total) by mouth daily for 7 days. Vaillancourt, Samantha, PA-C Taking Active   nitroGLYCERIN  (NITROSTAT ) 0.4 MG SL tablet [id number] No Place 1 tablet (0.4 mg total) under the tongue every 5 (five) minutes as needed for chest pain. Cherrie Cornwall, MD  Taking Active Child           Med Note Blondie Burke   Fri Dec 16, 2023  2:05 PM) prn  omeprazole (PRILOSEC) 20 MG capsule [id number] No Take 20 mg by mouth daily. [provider] Taking Active Self, Child  PROGRAF  1 MG capsule [id number] No Take 1 mg by mouth every morning. [provider] Taking Active Self, Child  sodium chloride  (OCEAN) 0.65 % nasal spray [id number]  Place 1 spray into the nose as needed (dry nose related to cpap use). Bair, Kalpana, MD  Active   tacrolimus  (PROGRAF ) 0.5 MG capsule [id number] No Take 0.5 mg by mouth at bedtime. [provider] Taking Active Self, Child  tamsulosin  (FLOMAX ) 0.4 MG CAPS capsule [id number] No Take 0.8 mg by mouth daily. [provider] Taking Active Self, Child           Med Note Kolleen Perone   Wed Jun 01, 2023  1:30 PM)              Home Care and Equipment/Supplies: Were Home Health Services Ordered?: NA Any new equipment or medical supplies ordered?: NA  Functional Questionnaire: Do you need assistance with bathing/showering or dressing?: No Do you need assistance with meal preparation?: No Do you need assistance with eating?: No Do you have difficulty maintaining continence: No Do you need assistance with getting out of bed/getting out of a chair/moving?: No Do you have difficulty managing or taking your medications?: No  Follow up appointments reviewed: PCP Follow-up appointment confirmed?: Yes Date of PCP follow-up appointment?: 12/21/23 Follow-up Provider: [facility] Follow-up appointment confirmed?: Yes Date of Specialist follow-up appointment?: 01/24/24 Follow-Up Specialty Provider:: Dr. Ledon Pry Do you need transportation to your follow-up appointment?: No Do you understand care options if your condition(s) worsen?: Yes-patient verbalized understanding  SDOH Interventions Today    Flowsheet Row Most Recent Value  SDOH Interventions   Food Insecurity  Interventions Intervention Not Indicated  Housing Interventions Intervention Not Indicated  Transportation Interventions Intervention Not Indicated  Utilities Interventions Intervention Not Indicated       Goals      VBCI Transitions of Care (TOC) Care Plan     Problems:  Recent Hospitalization for treatment of CHF and DMII  Goal:  Over the next 30 days, the patient will not experience hospital readmission  Interventions:   AFIB Interventions:   Counseled on increased risk of stroke due to Afib and benefits of anticoagulation for stroke prevention Reviewed importance of adherence to anticoagulant exactly as prescribed Counseled on bleeding risk associated with Eliquis  5mg  twice daily and importance of self-monitoring for signs/symptoms of bleeding Counseled on avoidance of NSAIDs due to increased bleeding risk with anticoagulants Counseled on seeking medical attention after a head injury or if there is blood in the urine/stool   Heart Failure Interventions: Provided education on low sodium diet Assessed need for readable accurate scales in home Advised patient to weigh each morning after emptying bladder Discussed importance of daily weight and advised patient to weigh and  record daily Reviewed role of diuretics in prevention of fluid overload and management of heart failure; Discussed the importance of keeping all appointments with provider Provided patient with education about the role of exercise in the management of heart failure  Diabetes Interventions: Assessed patient's understanding of A1c goal: <7% Reviewed medications with patient and discussed importance of medication adherence Counseled on importance of regular laboratory monitoring as prescribed Discussed plans with patient for ongoing care management follow up and provided patient with direct contact information for care management team Assessed social determinant of health barriers Lab Results  Component Value  Date   HGBA1C 6.6 (H) 12/16/2023    Patient Self Care Activities:  Attend all scheduled provider appointments Call pharmacy for medication refills 3-7 days in advance of running out of medications Call provider office for new concerns or questions  Notify RN Care Manager of [facility] call rescheduling needs Participate in Transition of Care Program/Attend [facility] scheduled calls Perform all self care activities independently  Take medications as prescribed    Plan:  Telephone follow up appointment with care management team member scheduled for:  Thursday May 29th at 12pm       Received a request from the Physician Liaison with [facility] -  about a patient Dr. Casimir Cleaver had in her clinic on 12/21/23. The provider reached out to the liaison requesting a TOC call be done because he was diagnosed with new CHF and had not been called. The patient was not on the [facility] Workbook list. Contact was made, the patient was enrolled and the provider was notified.   Gareld June, BSN, RN [place]  VBCI - [employer] [facility] RN Care Manager [phone]

## 2023-12-22 NOTE — Transitions of Care (Post Inpatient/ED Visit) (Signed)
   12/22/2023  Name: [name] MRN: [id number] DOB: 09/06/1955  Today's TOC FU Call Status: Today's TOC FU Call Status:: Unsuccessful Call (1st Attempt) Unsuccessful Call (1st Attempt) Date: 12/22/23  Attempted to reach the patient regarding the most recent Inpatient/ED visit.  Follow Up Plan: Additional outreach attempts will be made to reach the patient to complete the Transitions of Care (Post Inpatient/ED visit) call.   Gareld June, BSN, RN [place]  VBCI - [employer] [facility] RN Care Manager [phone]

## 2023-12-26 ENCOUNTER — Ambulatory Visit (INDEPENDENT_AMBULATORY_CARE_PROVIDER_SITE_OTHER): Admitting: Cardiovascular Disease

## 2023-12-26 ENCOUNTER — Encounter: Payer: Self-pay | Admitting: Cardiovascular Disease

## 2023-12-26 VITALS — BP 122/70 | HR 50 | Resp 94 | Ht 68.0 in | Wt 171.8 lb

## 2023-12-26 DIAGNOSIS — I509 Heart failure, unspecified: Secondary | ICD-10-CM | POA: Diagnosis not present

## 2023-12-26 DIAGNOSIS — I259 Chronic ischemic heart disease, unspecified: Secondary | ICD-10-CM | POA: Diagnosis not present

## 2023-12-26 DIAGNOSIS — Z8679 Personal history of other diseases of the circulatory system: Secondary | ICD-10-CM

## 2023-12-26 DIAGNOSIS — I25708 Atherosclerosis of coronary artery bypass graft(s), unspecified, with other forms of angina pectoris: Secondary | ICD-10-CM | POA: Diagnosis not present

## 2023-12-26 DIAGNOSIS — R001 Bradycardia, unspecified: Secondary | ICD-10-CM

## 2023-12-26 NOTE — Progress Notes (Signed)
 Cardiology Office Note   Date:  12/26/2023   ID:  [name], [name] [birth year], MRN [id number]  PCP:  Jacklin Mascot, MD  Cardiologist:  Debborah Fairly, MD      History of Present Illness: [name] is a 68 y.o. male who presents for  Chief Complaint  Patient presents with   Follow-up    4 week follow up    Feeling good      Past Medical History:  Diagnosis Date   Acute exacerbation of CHF (congestive heart failure) (HCC) 12/16/2023   Aortic atherosclerosis (HCC)    Arthritis    BPH with LUTS s/p UroLift with Dr. Ace Holder in November 2024 on finasteride , Flomax , and Sanctura   and sees [facility]    BPH with LUTS, s/p urolift with Dr. Ace Holder 06/2023, on Finasteride , Flomax , Sancture, sees    Carotid arterial disease (HCC)    a.) s/p LEFT CEA on 09/06/2019   Chest pain 09/20/2017   Coronary artery disease    a.) MI with stents x 2 in 2004; b.) MI with stent x 1 2005; c.) s/p CABG x 2 06/02/2006 (LIMA-LAD, SVG-PDA); d.) LHC/PCI 08/25/2007: 80% oD1 (2.5 x 12 mm Xience V DES)   DDD (degenerative disc disease), lumbar    Dyspnea    ESRD (end stage renal disease) (HCC)    a.) s/p RIGHT renal 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/2015   GERD (gastroesophageal reflux disease)    History of blood transfusion    Hypertension    Long term current use of aspirin     Long term current use of immunosuppressive drug    a.) mycophenolate  + tacrolimus    Myocardial infarction [facility]) 2004   a.) details unclear; stents x 2 (unknown type/location)   Myocardial infarction (HCC) 2005   a.) details unclear; stent x 1 (unknown type/location)   On apixaban  therapy    PAF (paroxysmal atrial fibrillation) (HCC)    a.) CHA2DS2-VASc = 5 (age, HTN, vascular disease history/MI, T2DM) as of 06/10/2023; b.) cardiac rate/rhythm maintained intrinsically without pharmacological intervention; chronically anticoagulated using apixaban    Sepsis due to gram-negative UTI (HCC) 04/27/2020   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   From DM and HTN, f/u with [facility] every 6 months   Subconjunctival hemorrhage of right eye 12/15/2023   T2DM (type 2 diabetes mellitus) (HCC)    Unstable angina (HCC)    Vitamin D deficiency      Past Surgical History:  Procedure Laterality Date   CATARACT EXTRACTION Bilateral    CATARACT EXTRACTION W/PHACO Right 02/03/2017   Procedure: CATARACT EXTRACTION PHACO AND INTRAOCULAR LENS PLACEMENT (IOC);  Surgeon: Annell Kidney, MD;  Location: [facility] ORS;  Service: Ophthalmology;  Laterality: Right;  US  00:35.8 AP% 12.8 CDE 4.57 Fluid lot # [id number]   COLONOSCOPY WITH PROPOFOL  N/A 12/30/2021   Procedure: COLONOSCOPY WITH PROPOFOL ;  Surgeon: Luke Salaam, MD;  Location: [facility] ENDOSCOPY;  Service: Gastroenterology;  Laterality: N/A;   CORONARY ANGIOPLASTY WITH STENT PLACEMENT Left 2004   stents x 2   CORONARY ANGIOPLASTY WITH STENT PLACEMENT Left 2005   stent x 1   CORONARY ANGIOPLASTY WITH STENT PLACEMENT Left 08/25/2007   Procedure: CORONARY ANGIOPLASTY WITH STENT PLACEMENT; Location: [facility]; Surgeon: Jama Mayotte, MD   CORONARY ARTERY BYPASS GRAFT N/A 06/08/2006   Procedure: CORONARY ARTERY BYPASS GRAFT; Location: [facility]; Surgeon: Dorethea Ganong, MD   CYSTOSCOPY WITH INSERTION OF UROLIFT N/A 06/13/2023   Procedure: CYSTOSCOPY WITH INSERTION OF UROLIFT;  Surgeon: Dustin Gimenez, MD;  Location:  [facility] ORS;  Service: Urology;  Laterality: N/A;   ENDARTERECTOMY Left 09/06/2019   Procedure: ENDARTERECTOMY CAROTID;  Surgeon: Celso College, MD;  Location: [facility] ORS;  Service: Vascular;  Laterality: Left;   HIP FRACTURE SURGERY     KIDNEY TRANSPLANT Right 05/10/2015   LEFT HEART CATH AND CORONARY ANGIOGRAPHY Left 05/20/2006   Procedure: LEFT HEART CATH AND CORONARY ANGIOGRAPHY; Location: [facility]; Surgeon: Debborah Fairly, MD   LEFT HEART CATH AND CORONARY ANGIOGRAPHY Left 11/08/2023   Procedure: LEFT HEART CATH AND CORONARY ANGIOGRAPHY with possible intervention;  Surgeon: Cherrie Cornwall, MD;  Location: [facility] INVASIVE CV LAB;  Service: Cardiovascular;  Laterality: Left;   LEFT HEART CATH AND CORS/GRAFTS ANGIOGRAPHY Left 08/10/2007   Procedure: LEFT HEART CATH AND CORS/GRAFTS ANGIOGRAPHY; Location: [facility]; Surgeon: Debborah Fairly, MD   LEFT HEART CATH AND CORS/GRAFTS ANGIOGRAPHY Left 05/04/2011   Procedure: LEFT HEART CATH AND CORS/GRAFTS ANGIOGRAPHY; Location: [facility]; Surgeon: Debborah Fairly, MD     Current Outpatient Medications  Medication Sig Dispense Refill   amiodarone  (PACERONE ) 200 MG tablet Take 1 tablet (200 mg total) by mouth daily. 30 tablet 0   ascorbic acid (VITAMIN C) 500 MG tablet Take 500 mg by mouth daily.     aspirin  EC 81 MG EC tablet Take 1 tablet (81 mg total) by mouth daily at 6 (six) AM.     atorvastatin  (LIPITOR) 40 MG tablet Take 40 mg by mouth at bedtime.     CELLCEPT  250 MG capsule Take 500 mg by mouth 2 (two) times daily.     Cholecalciferol  (VITAMIN D) 50 MCG (2000 UT) CAPS Take 2,000 Units by mouth daily.     Continuous Blood Gluc Sensor (FREESTYLE LIBRE 2 SENSOR) MISC by Does not apply route.     ELIQUIS  5 MG TABS tablet Take 5 mg by mouth 2 (two) times daily.      finasteride  (PROSCAR ) 5 MG tablet TAKE 1 TABLET BY MOUTH ONCE DAILY. 90 tablet 0   furosemide  (LASIX ) 20 MG tablet Take 1 tablet (20 mg total) by mouth daily. 30 tablet 0   HUMALOG KWIKPEN 100 UNIT/ML KiwkPen Inject 10-12 Units into the skin 3 (three) times daily. Inject 10 units daily at breakfast, 10 units at lunch and up to 10 units or less at supper per SSI     Insulin  Pen Needle 33G X 4 MM MISC To use with insulin  pen 4 times per day. E 13.9     LANTUS  SOLOSTAR 100 UNIT/ML Solostar Pen Inject 24 Units into the skin at bedtime.      linagliptin (TRADJENTA) 5 MG TABS tablet Take 5 mg by mouth daily.     losartan  (COZAAR ) 100 MG tablet Take 1 tablet (100 mg total) by mouth daily. 30 tablet 11   nitroGLYCERIN  (NITROSTAT ) 0.4 MG SL tablet Place 1 tablet (0.4 mg total) under the  tongue every 5 (five) minutes as needed for chest pain. 100 tablet 3   omeprazole (PRILOSEC) 20 MG capsule Take 20 mg by mouth daily.     PROGRAF  1 MG capsule Take 1 mg by mouth every morning.     sodium chloride  (OCEAN) 0.65 % nasal spray Place 1 spray into the nose as needed (dry nose related to cpap use). 30 mL 12   tacrolimus  (PROGRAF ) 0.5 MG capsule Take 0.5 mg by mouth at bedtime.     tamsulosin  (FLOMAX ) 0.4 MG CAPS capsule Take 0.8 mg by mouth daily.     No current facility-administered medications for  this visit.   Facility-Administered Medications Ordered in Other Visits  Medication Dose Route Frequency Provider Last Rate Last Admin   sodium chloride  flush (NS) 0.9 % injection 3 mL  3 mL Intravenous Q12H Cherrie Cornwall, MD        Allergies:   Oxybutynin     Social History:   reports that he has quit smoking. He has never used smokeless tobacco. He reports that he does not drink alcohol and does not use drugs.   Family History:  family history includes Drug abuse in his brother and sister; Osteoporosis in his mother.    ROS:     Review of Systems  Constitutional: Negative.   HENT: Negative.    Eyes: Negative.   Respiratory: Negative.    Gastrointestinal: Negative.   Genitourinary: Negative.   Musculoskeletal: Negative.   Skin: Negative.   Neurological: Negative.   Endo/Heme/Allergies: Negative.   Psychiatric/Behavioral: Negative.    All other systems reviewed and are negative.     All other systems are reviewed and negative.    PHYSICAL EXAM: VS:  BP 122/70   Pulse (!) 50   Resp (!) 94   Ht 5\' 8"  (1.727 m)   Wt 171 lb 12.8 oz (77.9 kg)   BMI 26.12 kg/m  , BMI Body mass index is 26.12 kg/m. Last weight:  Wt Readings from Last 3 Encounters:  12/26/23 171 lb 12.8 oz (77.9 kg)  12/22/23 164 lb (74.4 kg)  12/21/23 168 lb 6.4 oz (76.4 kg)     Physical Exam Vitals reviewed.  Constitutional:      Appearance: Normal appearance. He is normal weight.  HENT:      Head: Normocephalic.     Nose: Nose normal.     Mouth/Throat:     Mouth: Mucous membranes are moist.  Eyes:     Pupils: Pupils are equal, round, and reactive to light.  Cardiovascular:     Rate and Rhythm: Normal rate and regular rhythm.     Pulses: Normal pulses.     Heart sounds: Normal heart sounds.  Pulmonary:     Effort: Pulmonary effort is normal.  Abdominal:     General: Abdomen is flat. Bowel sounds are normal.  Musculoskeletal:        General: Normal range of motion.     Cervical back: Normal range of motion.  Skin:    General: Skin is warm.  Neurological:     General: No focal deficit present.     Mental Status: He is alert.  Psychiatric:        Mood and Affect: Mood normal.       EKG:   Recent Labs: 12/16/2023: B Natriuretic Peptide 684.4; TSH 2.987 12/18/2023: ALT 18; Hemoglobin 14.5; Magnesium  2.0; Platelets 145 12/21/2023: BUN 37; Creatinine, Ser 1.58; Potassium 4.3; Sodium 137    Lipid Panel    Component Value Date/Time   CHOL  10/20/2010 0400    94        ATP III CLASSIFICATION:  <200     mg/dL   Desirable  161-096  mg/dL   Borderline High  >=045    mg/dL   High          TRIG 409 (H) 10/23/2010 0600      Other studies Reviewed: Additional studies/ records that were reviewed today include:  Review of the above records demonstrates:       No data to display  ASSESSMENT AND PLAN:    ICD-10-CM   1. Atrial fibrillation, currently in sinus rhythm  Z86.79    doing well    2. Sinus bradycardia  R00.1    changed to amio 200 as sinus brady 50/min    3. Coronary artery disease involving coronary bypass graft of native heart with other forms of angina pectoris (HCC)  I25.708     4. Chest pain due to myocardial ischemia, unspecified ischemic chest pain type  I25.9     5. Heart failure, unspecified HF chronicity, unspecified heart failure type (HCC)  I50.9        Problem List Items Addressed This Visit        Cardiovascular and Mediastinum   Coronary artery disease (Chronic)   Other Visit Diagnoses       Atrial fibrillation, currently in sinus rhythm    -  Primary   doing well     Sinus bradycardia       changed to amio 200 as sinus brady 50/min     Chest pain due to myocardial ischemia, unspecified ischemic chest pain type         Heart failure, unspecified HF chronicity, unspecified heart failure type (HCC)              Disposition:   Return in about 2 months (around 02/25/2024).    Total time spent: 30 minutes  Signed,  Debborah Fairly, MD  12/26/2023 10:21 AM    [facility]

## 2023-12-28 ENCOUNTER — Telehealth: Payer: Self-pay | Admitting: Cardiovascular Disease

## 2023-12-28 ENCOUNTER — Other Ambulatory Visit: Payer: Self-pay

## 2023-12-28 DIAGNOSIS — I5022 Chronic systolic (congestive) heart failure: Secondary | ICD-10-CM

## 2023-12-28 MED ORDER — FUROSEMIDE 20 MG PO TABS
20.0000 mg | ORAL_TABLET | Freq: Every day | ORAL | 1 refills | Status: DC
Start: 2023-12-28 — End: 2024-05-11

## 2023-12-28 NOTE — Telephone Encounter (Signed)
 Edwina Gram, Nurse Care Manager with [facility] called requesting an order for a scale for patient to weigh himself daily. Please order a scale and fax order to a local medical supply store.   Edwina Gram [phone]

## 2023-12-28 NOTE — Progress Notes (Signed)
 1. Heart failure with mildly reduced ejection fraction (HFmrEF) (HCC) (Primary) - furosemide  (LASIX ) 20 MG tablet; Take 1 tablet (20 mg total) by mouth daily.  Dispense: 90 tablet; Refill: 1  Talked with patient's cardiologist Dr. Meredeth Stallion on 12/28/23. Recommends daily 20 mg Lasix , refill sent.   Jacklin Mascot, MD

## 2023-12-28 NOTE — Progress Notes (Signed)
 Coding Query: Heart failure with mildly reduced ejection fraction (HFmrEF) (HCC)

## 2023-12-29 ENCOUNTER — Other Ambulatory Visit: Payer: Self-pay

## 2023-12-29 NOTE — Patient Instructions (Signed)
 Visit Information  Thank you for taking time to visit with me today. Please don't hesitate to contact me if I can be of assistance to you before our next scheduled telephone appointment.  Following is a copy of your care plan:   Goals Addressed             This Visit's Progress    COMPLETED: VBCI Transitions of Care (TOC) Care Plan       Problems: (reviewed 12/29/23) Recent Hospitalization for treatment of CHF and DMII  Goal:  (reviewed 12/29/23) Over the next 30 days, the patient will not experience hospital readmission  Interventions:  (reviewed 12/29/23)  AFIB Interventions:  (reviewed 12/29/23)   Counseled on increased risk of stroke due to Afib and benefits of anticoagulation for stroke prevention Reviewed importance of adherence to anticoagulant exactly as prescribed Counseled on bleeding risk associated with Eliquis  5mg  twice daily and importance of self-monitoring for signs/symptoms of bleeding Counseled on avoidance of NSAIDs due to increased bleeding risk with anticoagulants Counseled on seeking medical attention after a head injury or if there is blood in the urine/stool   Heart Failure Interventions:  (reviewed 12/29/23) Provided education on low sodium diet Assessed need for readable accurate scales in home Advised patient to weigh each morning after emptying bladder Discussed importance of daily weight and advised patient to weigh and record daily Reviewed role of diuretics in prevention of fluid overload and management of heart failure; Discussed the importance of keeping all appointments with provider Provided patient with education about the role of exercise in the management of heart failure  Diabetes Interventions:   (reviewed 12/29/23) Assessed patient's understanding of A1c goal: <7% Reviewed medications with patient and discussed importance of medication adherence Counseled on importance of regular laboratory monitoring as prescribed Discussed plans with  patient for ongoing care management follow up and provided patient with direct contact information for care management team Assessed social determinant of health barriers Lab Results  Component Value Date   HGBA1C 6.6 (H) 12/16/2023    Patient Self Care Activities:   (reviewed 12/29/23) Attend all scheduled provider appointments Call pharmacy for medication refills 3-7 days in advance of running out of medications Call provider office for new concerns or questions  Notify RN Care Manager of TOC call rescheduling needs Participate in Transition of Care Program/Attend TOC scheduled calls Perform all self care activities independently  Take medications as prescribed    Plan:  Telephone follow up appointment with care management team member scheduled for:  No further TOC phone calls. The pateient has HTA C-SNP as coverage. HTA CM team manages these patient on their own.        Patient verbalizes understanding of instructions and care plan provided today and agrees to view in MyChart. Active MyChart status and patient understanding of how to access instructions and care plan via MyChart confirmed with patient.     The patient has been provided with contact information for the care management team and has been advised to call with any health related questions or concerns.   Please call the care guide team at [phone] if you need to cancel or reschedule your appointment.   Please call the Suicide and Crisis Lifeline: 988 call the [place]  National Suicide Prevention Lifeline: 937-553-5597 or TTY: 519-106-9572 TTY 220 090 2523) to talk to a trained counselor if you are experiencing a Mental Health or Behavioral Health Crisis or need someone to talk to.  Gareld June, BSN, RN [employer] [facility]  VBCI - Population  Health RN Care Manager [phone]

## 2023-12-29 NOTE — Transitions of Care (Post Inpatient/ED Visit) (Signed)
 Transition of Care week 2  Visit Note  12/29/2023  Name: [name] MRN: [id number]          DOB: [birth year]  Situation: Patient enrolled in [facility] 30-day program. Visit completed with Sudais Adolph by telephone.   Background:     Past Medical History:  Diagnosis Date   Acute exacerbation of CHF (congestive heart failure) (HCC) 12/16/2023   Aortic atherosclerosis (HCC)    Arthritis    BPH with LUTS s/p UroLift with Dr. Ace Holder in November 2024 on finasteride , Flomax , and Sanctura   and [facility]    BPH with LUTS, s/p urolift with Dr. Ace Holder 06/2023, on Finasteride , Flomax , Sancture, sees    Carotid arterial disease (HCC)    a.) s/p LEFT CEA on 09/06/2019   Chest pain 09/20/2017   Coronary artery disease    a.) MI with stents x 2 in 2004; b.) MI with stent x 1 2005; c.) s/p CABG x 2 06/02/2006 (LIMA-LAD, SVG-PDA); d.) LHC/PCI 08/25/2007: 80% oD1 (2.5 x 12 mm Xience V DES)   DDD (degenerative disc disease), lumbar    Dyspnea    ESRD (end stage renal disease) (HCC)    a.) s/p RIGHT renal 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/2015   GERD (gastroesophageal reflux disease)    History of blood transfusion    Hypertension    Long term current use of aspirin     Long term current use of immunosuppressive drug    a.) mycophenolate  + tacrolimus    Myocardial infarction [facility]) 2004   a.) details unclear; stents x 2 (unknown type/location)   Myocardial infarction (HCC) 2005   a.) details unclear; stent x 1 (unknown type/location)   On apixaban  therapy    PAF (paroxysmal atrial fibrillation) (HCC)    a.) CHA2DS2-VASc = 5 (age, HTN, vascular disease history/MI, T2DM) as of 06/10/2023; b.) cardiac rate/rhythm maintained intrinsically without pharmacological intervention; chronically anticoagulated using apixaban    Sepsis due to gram-negative UTI (HCC) 04/27/2020   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 HR 64 today Does patient have uncontrolled Hypertension?: No Cardiovascular Conditions: Hypertension, Heart failure, Dysrhythmia Cardiovascular Management Strategies: Medication therapy, Activity, Weight management Do You Have a Working Readable Scale?: No (One has been ordered for him) Weight: 171 lb (77.6 kg) Cardiovascular Self-Management Outcome: 4 (good)  Respiratory Respiratory Symptoms Reported: No symptoms reported    Endocrine Patient reports the following symptoms related to hypoglycemia or hyperglycemia : No symptoms reported Is patient diabetic?: Yes Is patient checking blood sugars at home?: Yes Endocrine Conditions: Diabetes Endocrine Management Strategies: Medication therapy, Medical device, Activity, Weight management Endocrine Self-Management Outcome: 4 (good)  Gastrointestinal Gastrointestinal Symptoms Reported: No symptoms reported   Nutrition Risk Screen (CP): No indicators present  Genitourinary Genitourinary Symptoms Reported: No symptoms reported Genitourinary Management Strategies: Medication therapy, Fluid modification Genitourinary Self-Management Outcome: 4 (good)  Integumentary Integumentary Symptoms Reported: No symptoms reported    Musculoskeletal Musculoskelatal Symptoms Reviewed: No symptoms  reported   Falls in the past year?: No Number of falls in past year: 1 or less Was there an injury with Fall?: No Fall Risk Category Calculator: 0 Patient Fall Risk Level: Low Fall  Risk Patient at Risk for Falls Due to: No Fall Risks  Psychosocial Psychosocial Symptoms Reported: No symptoms reported         There were no vitals filed for this visit.  Medications Reviewed Today   Medications were not reviewed in this encounter     Recommendation:   Continue Current Plan of Care  Follow Up Plan:   Closing From:  Transitions of Care Program.  He has HTA C-SNP as coverage. HTA CM team manages these patients on their own.  Gareld June, BSN, RN [place]  VBCI - [employer] [facility] RN Care Manager [phone]

## 2024-01-03 DIAGNOSIS — E119 Type 2 diabetes mellitus without complications: Secondary | ICD-10-CM | POA: Diagnosis not present

## 2024-01-03 DIAGNOSIS — R8279 Other abnormal findings on microbiological examination of urine: Principal | ICD-10-CM

## 2024-01-03 DIAGNOSIS — Z940 Kidney transplant status: Principal | ICD-10-CM

## 2024-01-03 DIAGNOSIS — R799 Abnormal finding of blood chemistry, unspecified: Principal | ICD-10-CM

## 2024-01-04 NOTE — Unmapped (Signed)
 [facility]   TRANSPLANT FOLLOW UP     PCP: [facility]   Kidney transplant coordinator: Sharla Davis    Date of Visit at Transplant clinic: 01/05/2024     Assessment/Recommendations:     # s/p deceased donor kidney 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/10/2015   Native kidney disease presumed DM/HTN  Graft function: Creatinine 1.2-1.4 without proteinuria  DSAs: not present as of May 2024  FOllows with urology (Dr. Stephens Eis at [facility]), had Urolift 06/13/23. Improvement in urinary retention.    # Immunosuppression  Tacrolimus (Prograf) 1 AM, 0.5 mg PM, goal trough 4-6 ng/mL  Mycophenolate (Cellcept) 500 mg BID     # Acute issues today  none    # BP management   Goal 130/80, as tolerated.  - losartan 100 mg daily  - Lasix 20 mg daily (added May 2025 due to fluid overload, takes 40 mg if anticipates increased salt)  - he is on tamsulosin 0.4 mg BID    # Infectious disease  CMV D-/R+, EBV D+/R+  No infectious issues    # Anemia screening  Hb normal    # Cardiovascular: secondary prevention  CAD s/p CABG 2007, paraxysmal Afib, ICM EF 45-50% April 2025  - follows with cardiology, Dr. Meredeth Stallion at [facility]  - on apixiban, amiodarone    # CKD-BMD  VitD 60, PTH 103    # Electrolytes  Mg and K normal    # Comorbidities  DM2: managed by endocrinology (Dr. Lavanda Porter, [facility] in [place]).   - currently taking linagliptin 5 mg daily, plus Lantus 30 units and Humalog TIDcc (10/10/6 units)  - reports A1C at his endo visit was 6.6%  - It is ok to add SGLT2i if determined by endocrinology to be of benefit (good quality observational data of safety in kidney transplant)    # Immunizations  Immunization History   Administered Date(s) Administered    COVID-19 VAC,BIVALENT,MODERNA(BLUE CAP) 04/28/2021, 10/08/2021    COVID-19 VACCINE,MRNA(MODERNA)(PF) 08/23/2019, 09/25/2019, 05/30/2020    Covid-19 Vac, (51yr+) (Spikevax) Monovalent Moderna 05/21/2022, 05/25/2023    INFLUENZA TIV (TRI) 42MO+ W/ PRESERV (IM) 05/07/2008 Influenza Vaccine Quad (IIV4 W/PRESERV) 42MO+ 05/21/2016    Influenza Virus Vaccine, unspecified formulation 05/09/2015, 04/24/2016, 05/12/2017, 05/06/2022    PNEUMOCOCCAL POLYSACCHARIDE 23-VALENT 05/07/2008, 09/30/2009    PPD Test 04/22/2010    Pneumococcal Conjugate 13-Valent 05/21/2016    Pneumococcal Conjugate 20-valent 05/20/2022    SHINGRIX-ZOSTER VACCINE (HZV),RECOMBINANT,ADJUVANTED(IM) 02/15/2017    TdaP 08/21/2013    ZOSTAVAX - ZOSTER VACCINE, LIVE, SQ 08/21/2013       # Cancer screening  Colonoscopy:  12/30/2021  Skin: recommend yearly dermatology evaluation    # Follow up:  Labs every 4-6 weeks  Visits return in 6 months      Kidney Transplant History:   Date of Transplant: 05/10/2015 (Kidney)  Type of Transplant: DDKT  KDPI: 37%  Cold ischemic time: 22 hr 13 min  Warm ischemic time: 49 minutes  cPRA: 0%  HLA match: 3/6 mismatch  Blood type: Donor A1, Recipient A POS  ID: CMV D-/R+, EBV D+/R+  Native Kidney Disease: presumed DM/HTN   Native kidney biopsy: no   Pre-transplant dialysis course: on HD   Post-Transplant Course:    Delayed graft function requiring dialysis: yes    Other complications: no  Prior Transplants: None  Induction: Campath   Early steroid withdrawal: Yes    Biopsies:   Zero-Hour Biopsy: normal    History of Presenting Illness:  Since the last visit:  - BP high today but he is pre-meds; at home his BP is 99-129/50s, with no orthostatic symptoms  - he had LHC 11/08/23 with no PCI, had an ER visit last week to [facility] for fluid overload; his amiodarone was reduced from 400 to 200 mg, and he added Lasix 20 mg. Since then his volume overload has completely resolved.      Concerns about nonadherence: No    Social: Lives in [place]. His wife and daughter are support people.    Review of Systems:   A 12-system review was negative except as documented in the HPI.    Physical Exam:     BP 172/56 (BP Site: R Arm, BP Position: Sitting, BP Cuff Size: Medium)  - Pulse 51  - Temp 36.1 ??C (96.9 ??F) (Temporal)  - Wt 77.2 kg (170 lb 3.2 oz)  - BMI 25.88 kg/m??   Constitutional:  Well-appearing in NAD  Eyes:  anicteric sclerae  ENT:  MMM  CV:  RRR, extremities WWP with no edema  Resp:  normal WOB  MSK:  Grossly normal, exam is limited  Skin:  Normal turgor, no rash  Neuro:  Grossly normal, exam is limited  Dialysis access: LUE AVF      Allergies:   Allergies   Allergen Reactions    Oxybutynin Other (See Comments)     Incomplete bladder emptying        Current Medications:   Current Outpatient Medications   Medication Sig Dispense Refill    amiodarone (PACERONE) 200 MG tablet Take 1 tablet (200 mg total) by mouth daily. TAKE 1 TABLET (200 MG TOTAL) BY MOUTH DAILY.      furosemide (LASIX) 20 MG tablet Take 1 tablet (20 mg total) by mouth daily. TAKE 1 TABLET (20 MG TOTAL) BY MOUTH DAILY.      apixaban (ELIQUIS) 5 mg Tab Take 1 tablet (5 mg total) by mouth Two (2) times a day.      ascorbic acid, vitamin C, (VITAMIN C) 500 MG tablet Take 1 tablet (500 mg total) by mouth daily.      aspirin (ECOTRIN) 81 MG tablet TAKE ONE (1) TABLET BY MOUTH EVERY DAY 90 tablet 4    atorvastatin (LIPITOR) 40 MG tablet TOME 1 TABLETA POR LA BOCA  DIARIAMENTE CON LA COMIDA  DE LA TARDE 90 tablet 4    blood sugar diagnostic Strp ge glucose test strips to check bs 4 times per day. E 13.9 120 strip 6    cholecalciferol, vitamin D3-50 mcg, 2,000 unit,, 50 mcg (2,000 unit) cap Take 1 capsule (50 mcg total) by mouth daily. 90 capsule 3    finasteride (PROSCAR) 5 mg tablet Take 1 tablet (5 mg total) by mouth daily.      flash glucose sensor (FREESTYLE LIBRE 14 DAY SENSOR) kit by Miscellaneous route.      insulin glargine (LANTUS SOLOSTAR) 100 unit/mL (3 mL) injection pen Inject 0.24 mL (24 Units total) under the skin nightly. DM E11.9 10 pen 1    insulin lispro (HUMALOG KWIKPEN INSULIN) 100 unit/mL injection pen Inject 10 units at breakfast, 10 units at lunch, and SS scale units at dinner. Inject 0-12 units based on sliding scale for BG > 150. 5 pen 5    lancets Misc Use to check blood sugars 4 times per day. 200 each 11    linagliptin (TRADJENTA) 5 mg Tab Take 1 tablet (5 mg total) by mouth daily. 90 tablet 3  losartan (COZAAR) 50 MG tablet Take 1 tablet (50 mg total) by mouth nightly. 360 tablet 0    mycophenolate (CELLCEPT) 250 mg capsule Take 2 capsules (500 mg total) by mouth two (2) times a day. 360 capsule 3    omeprazole (PRILOSEC) 20 MG capsule Take 1 capsule (20 mg total) by mouth daily.      OMNIPOD 5 PACK PODS       pen needle, diabetic (ADVOCATE PEN NEEDLE) 33 gauge x 5/32 Ndle To use with insulin pen 4 times per day. E 13.9 120 each 6    pen needle, diabetic (PEN NEEDLE) 31 gauge x 1/4 Ndle E11.9; check blood glucose TID AC 100 each 5     No current facility-administered medications for this visit.       Past Medical History:   Past Medical History:   Diagnosis Date    Aspiration pneumonia   2011    while comatose    Atrial fibrillation       Coronary artery disease 2004    open heart surgery 06/30/2006 2-V bypass; MI 2004 2 stents, MI 2005 1 stent; 1 stent 1st diagonal 08/25/2007    Diabetes mellitus       on insulin    Diabetic nephropathy       End-stage renal disease (ESRD)       Hypercholesteremia     Hypertension     Immunosuppression 11/11/2015    Kidney 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/10/2015 05/12/2015    Myocardial infarct, old     Pulmonary scarring 2011    from prior CT placement    Vertebral osteomyelitis, chronic   04/2010    on suppressive doxycycline    Vitamin D deficiency         Laboratory studies:   Reviewed recent results.

## 2024-01-05 ENCOUNTER — Other Ambulatory Visit: Payer: Self-pay

## 2024-01-05 ENCOUNTER — Encounter: Payer: Self-pay | Admitting: Cardiovascular Disease

## 2024-01-05 ENCOUNTER — Ambulatory Visit: Admit: 2024-01-05 | Discharge: 2024-01-05 | Payer: Medicare (Managed Care)

## 2024-01-05 ENCOUNTER — Inpatient Hospital Stay: Admit: 2024-01-05 | Discharge: 2024-01-05 | Payer: Medicare (Managed Care)

## 2024-01-05 DIAGNOSIS — J9811 Atelectasis: Secondary | ICD-10-CM | POA: Diagnosis not present

## 2024-01-05 DIAGNOSIS — Z794 Long term (current) use of insulin: Secondary | ICD-10-CM | POA: Diagnosis not present

## 2024-01-05 DIAGNOSIS — Z940 Kidney transplant status: Secondary | ICD-10-CM | POA: Diagnosis not present

## 2024-01-05 DIAGNOSIS — Z4822 Encounter for aftercare following kidney transplant: Secondary | ICD-10-CM | POA: Diagnosis not present

## 2024-01-05 DIAGNOSIS — R339 Retention of urine, unspecified: Secondary | ICD-10-CM | POA: Diagnosis not present

## 2024-01-05 DIAGNOSIS — I4891 Unspecified atrial fibrillation: Secondary | ICD-10-CM | POA: Diagnosis not present

## 2024-01-05 DIAGNOSIS — I2510 Atherosclerotic heart disease of native coronary artery without angina pectoris: Secondary | ICD-10-CM | POA: Diagnosis not present

## 2024-01-05 DIAGNOSIS — R8279 Other abnormal findings on microbiological examination of urine: Secondary | ICD-10-CM | POA: Diagnosis not present

## 2024-01-05 DIAGNOSIS — J90 Pleural effusion, not elsewhere classified: Secondary | ICD-10-CM | POA: Diagnosis not present

## 2024-01-05 DIAGNOSIS — E119 Type 2 diabetes mellitus without complications: Secondary | ICD-10-CM | POA: Diagnosis not present

## 2024-01-05 DIAGNOSIS — E7800 Pure hypercholesterolemia, unspecified: Secondary | ICD-10-CM | POA: Diagnosis not present

## 2024-01-05 DIAGNOSIS — Z951 Presence of aortocoronary bypass graft: Secondary | ICD-10-CM | POA: Diagnosis not present

## 2024-01-05 DIAGNOSIS — Z7982 Long term (current) use of aspirin: Secondary | ICD-10-CM | POA: Diagnosis not present

## 2024-01-05 DIAGNOSIS — Z7984 Long term (current) use of oral hypoglycemic drugs: Secondary | ICD-10-CM | POA: Diagnosis not present

## 2024-01-05 DIAGNOSIS — Z7960 Long term (current) use of unspecified immunomodulators and immunosuppressants: Secondary | ICD-10-CM | POA: Diagnosis not present

## 2024-01-05 DIAGNOSIS — I10 Essential (primary) hypertension: Secondary | ICD-10-CM | POA: Diagnosis not present

## 2024-01-05 DIAGNOSIS — E213 Hyperparathyroidism, unspecified: Secondary | ICD-10-CM | POA: Diagnosis not present

## 2024-01-05 DIAGNOSIS — Z7901 Long term (current) use of anticoagulants: Secondary | ICD-10-CM | POA: Diagnosis not present

## 2024-01-05 DIAGNOSIS — R799 Abnormal finding of blood chemistry, unspecified: Principal | ICD-10-CM

## 2024-01-05 LAB — COMPREHENSIVE METABOLIC PANEL
ALBUMIN: 3.8 g/dL (ref 3.4–5.0)
ALKALINE PHOSPHATASE: 51 U/L (ref 46–116)
ALT (SGPT): 18 U/L (ref 10–49)
ANION GAP: 8 mmol/L (ref 5–14)
AST (SGOT): 22 U/L (ref <=34)
BILIRUBIN TOTAL: 0.7 mg/dL (ref 0.3–1.2)
BLOOD UREA NITROGEN: 30 mg/dL — ABNORMAL HIGH (ref 9–23)
BUN / CREAT RATIO: 25
CALCIUM: 8.7 mg/dL (ref 8.7–10.4)
CHLORIDE: 105 mmol/L (ref 98–107)
CO2: 25.9 mmol/L (ref 20.0–31.0)
CREATININE: 1.18 mg/dL (ref 0.73–1.18)
EGFR CKD-EPI (2021) MALE: 67 mL/min/1.73m2 (ref >=60)
GLUCOSE RANDOM: 163 mg/dL — ABNORMAL HIGH (ref 70–99)
POTASSIUM: 4.6 mmol/L (ref 3.4–4.8)
PROTEIN TOTAL: 7.3 g/dL (ref 5.7–8.2)
SODIUM: 139 mmol/L (ref 135–145)

## 2024-01-05 LAB — URINALYSIS WITH MICROSCOPY
BACTERIA: NONE SEEN /HPF
BILIRUBIN UA: NEGATIVE
BLOOD UA: NEGATIVE
GLUCOSE UA: NEGATIVE
KETONES UA: NEGATIVE
LEUKOCYTE ESTERASE UA: NEGATIVE
NITRITE UA: NEGATIVE
PH UA: 6 (ref 5.0–9.0)
PROTEIN UA: NEGATIVE
RBC UA: 1 /HPF (ref <=3)
SPECIFIC GRAVITY UA: 1.028 (ref 1.003–1.030)
SQUAMOUS EPITHELIAL: <1 /HPF (ref 0–5)
UROBILINOGEN UA: 2 — AB
WBC UA: <1 /HPF (ref <=2)

## 2024-01-05 LAB — HEMOGLOBIN A1C
ESTIMATED AVERAGE GLUCOSE: 143 mg/dL
HEMOGLOBIN A1C: 6.6 % — ABNORMAL HIGH (ref 4.8–5.6)

## 2024-01-05 LAB — LIPID PANEL
CHOLESTEROL: 109 mg/dL (ref <200)
HDL CHOLESTEROL: 37 mg/dL — ABNORMAL LOW (ref >40)
LDL CHOLESTEROL CALCULATED: 63 mg/dL (ref <100)
NON-HDL CHOLESTEROL: 72 mg/dL (ref <130)
TRIGLYCERIDES: 62 mg/dL (ref <150)

## 2024-01-05 LAB — CBC W/ AUTO DIFF
BASOPHILS ABSOLUTE COUNT: 0 10*9/L (ref 0.0–0.1)
BASOPHILS RELATIVE PERCENT: 0.5 %
EOSINOPHILS ABSOLUTE COUNT: 0.1 10*9/L (ref 0.0–0.5)
EOSINOPHILS RELATIVE PERCENT: 1.5 %
HEMATOCRIT: 43.9 % (ref 39.0–48.0)
HEMOGLOBIN: 14.6 g/dL (ref 12.9–16.5)
LYMPHOCYTES ABSOLUTE COUNT: 0.9 10*9/L — ABNORMAL LOW (ref 1.1–3.6)
LYMPHOCYTES RELATIVE PERCENT: 14.5 %
MEAN CORPUSCULAR HEMOGLOBIN CONC: 33.3 g/dL (ref 32.0–36.0)
MEAN CORPUSCULAR HEMOGLOBIN: 30.4 pg (ref 25.9–32.4)
MEAN CORPUSCULAR VOLUME: 91.1 fL (ref 77.6–95.7)
MEAN PLATELET VOLUME: 8.2 fL (ref 6.8–10.7)
MONOCYTES ABSOLUTE COUNT: 0.7 10*9/L (ref 0.3–0.8)
MONOCYTES RELATIVE PERCENT: 10.9 %
NEUTROPHILS ABSOLUTE COUNT: 4.7 10*9/L (ref 1.8–7.8)
NEUTROPHILS RELATIVE PERCENT: 72.6 %
PLATELET COUNT: 144 10*9/L — ABNORMAL LOW (ref 150–450)
RED BLOOD CELL COUNT: 4.82 10*12/L (ref 4.26–5.60)
RED CELL DISTRIBUTION WIDTH: 14.5 % (ref 12.2–15.2)
WBC ADJUSTED: 6.4 10*9/L (ref 3.6–11.2)

## 2024-01-05 LAB — PROTEIN / CREATININE RATIO, URINE
CREATININE, URINE: 121 mg/dL
PROTEIN URINE: 17.6 mg/dL
PROTEIN/CREAT RATIO, URINE: 0.145

## 2024-01-05 LAB — ALBUMIN / CREATININE URINE RATIO
ALBUMIN QUANT URINE: <0.3 mg/dL
ALBUMIN/CREATININE RATIO: <2.5 ug/mg (ref 0.0–30.0)
CREATININE, URINE: 121.1 mg/dL

## 2024-01-05 LAB — BILIRUBIN, DIRECT: BILIRUBIN DIRECT: 0.3 mg/dL (ref 0.00–0.30)

## 2024-01-05 LAB — PARATHYROID HORMONE (PTH): PARATHYROID HORMONE INTACT: 79.5 pg/mL (ref 18.5–88.1)

## 2024-01-05 LAB — TACROLIMUS LEVEL, TROUGH: TACROLIMUS, TROUGH: 5.4 ng/mL (ref 5.0–15.0)

## 2024-01-05 LAB — PHOSPHORUS: PHOSPHORUS: 2.8 mg/dL (ref 2.4–5.1)

## 2024-01-05 LAB — IRON & TIBC
IRON SATURATION: 30 % (ref 20–55)
IRON: 84 ug/dL (ref 65–175)
TOTAL IRON BINDING CAPACITY: 278 ug/dL (ref 250–425)

## 2024-01-05 LAB — FERRITIN: FERRITIN: 73.9 ng/mL (ref 10.5–307.3)

## 2024-01-05 LAB — MAGNESIUM: MAGNESIUM: 1.9 mg/dL (ref 1.6–2.6)

## 2024-01-05 MED ORDER — AMIODARONE HCL 200 MG PO TABS: 200.0000 mg | ORAL_TABLET | Freq: Every day | ORAL | 1 refills | Status: DC

## 2024-01-05 NOTE — Unmapped (Addendum)
 Please discuss with your primary doctor or endocrinologist consideration to add empagliflozin (Jardiance) or dapagliflozin Elvina Hammers). This is a once daily pill for diabetes, which has benefits to protect heart and kidney function. Most likely this would replace linagliptin.

## 2024-01-05 NOTE — Unmapped (Signed)
 The [facility] has made a second and final attempt to reach this patient to refill the following medication:mycophenolate 250 mg capsule (CELLCEPT).      We have left voicemails on the following phone numbers: [phone] .    Dates contacted: 01/03/2024  - 01/05/2024   Last scheduled delivery: 11/12/2021     The patient may be at risk of non-compliance with this medication. The patient should call the [facility] at [phone]  Option 4, then Option 4: Infectious Disease, Transplant to refill medication.    Arlester Bence   [facility] and [facility]

## 2024-01-05 NOTE — Unmapped (Signed)
 [facility] Refill Coordination Note    Specialty Medication(s) to be Shipped:   Transplant: mycophenolate mofetil 250mg     Other medication(s) to be shipped: No additional medications requested for fill at this time     Steven Lynn, DOB: 1955-10-24  Phone: [phone] (home)       All above HIPAA information was verified with patient.     Was a Nurse, learning disability used for this call? No    Completed refill call assessment today to schedule patient's medication shipment from the [facility]  [phone]).  All relevant notes have been reviewed.     Specialty medication(s) and dose(s) confirmed: Regimen is correct and unchanged.   Changes to medications: Steven Lynn reports no changes at this time.  Changes to insurance: No  New side effects reported not previously addressed with a pharmacist or physician: None reported  Questions for the pharmacist: No    Confirmed patient received a Conservation officer, historic buildings and a Surveyor, mining with first shipment. The patient will receive a drug information handout for each medication shipped and additional FDA Medication Guides as required.       DISEASE/MEDICATION-SPECIFIC INFORMATION        N/A    SPECIALTY MEDICATION ADHERENCE     Medication Adherence    Patient reported X missed doses in the last month: 0  Specialty Medication: Mycophenolate 250mg   Patient is on additional specialty medications: No  Patient is on more than two specialty medications: No  Any gaps in refill history greater than 2 weeks in the last 3 months: no  Demonstrates understanding of importance of adherence: yes  Informant: patient  Reliability of informant: reliable  Provider-estimated medication adherence level: good  Patient is at risk for Non-Adherence: No  Reasons for non-adherence: no problems identified  Confirmed plan for next specialty medication refill: delivery by pharmacy  Refills needed for supportive medications: not needed Refill Coordination    Has the Patients' Contact Information Changed: No  Is the Shipping Address Different: No         Were doses missed due to medication being on hold? No    Mycophenolate  250 mg: 4 days of medicine on hand       REFERRAL TO PHARMACIST     Referral to the pharmacist: Not needed      [facility]     Shipping address confirmed in Epic.     Cost and Payment: Patient has a copay of $31.40. They are aware and have authorized the pharmacy to charge the credit card on file.    Delivery Scheduled: Yes, Expected medication delivery date: 06/06.     Medication will be delivered via Same Day Courier to the prescription address in Epic WAM.    Sarina Curb   [facility]  Specialty Technician

## 2024-01-05 NOTE — Unmapped (Signed)
 Transplant Coordinator, Clinic Visit   Pt seen today by transplant nephrology for follow up, reviewed medications and symptoms.   Met with pt during clinic visit. Pt with daughter and wife. Pt saw endocrinology last week- last  A1C was 6.6  Pt also reports that he had an episode about 3 weeks ago where he had fluid on his legs and was SOB- he went to ER and was started on lasix and amiodarone- swelling much improved    Assessment  BP: 172/56 today in clinic, pt reports home BP's are around 99-129/50-60's  Lightheaded: denies  BG: last A1C was 6.6  Headache: denies  Hand tremors: yes  Numbness/tingling: denies  Fevers: denies  Chills/sweats: denies  Shortness of breath: denies  Chest pain or pressure: denies  Palpitations: denies  Abdominal pain: denies  Heart burn: denies  Nausea/vomiting: denies  Diarrhea/constipation: intermittent diarrhea  UTI symptoms: denies  Swelling: denies    Good appetite; reports adequate hydration.     Any new medications? denies  Immunosuppressant last taken: last night at 2100    Functional Score: 100   Normal no complaints; no evidence of  disease.     I spent a total of 10 minutes with Steven Lynn reviewing medications and symptoms.

## 2024-01-06 DIAGNOSIS — Z940 Kidney transplant status: Principal | ICD-10-CM

## 2024-01-06 LAB — BK VIRUS QUANTITATIVE PCR, BLOOD: BK BLOOD RESULT: NOT DETECTED

## 2024-01-06 LAB — CMV DNA, QUANTITATIVE, PCR
CMV QUANT: <35 [IU]/mL — ABNORMAL HIGH (ref <0)
CMV VIRAL LD: DETECTED — AB

## 2024-01-06 LAB — EBV QUANTITATIVE PCR, BLOOD: EBV VIRAL LOAD RESULT: NOT DETECTED

## 2024-01-06 NOTE — Unmapped (Signed)
 Specialty Medication(s): mycophenolate    Mr.Jeng has been dis-enrolled from the [facility] specialty pharmacy services as a result of enrollment in a manufacturer assistance program that sends medicine directly to the patient.    Additional information provided to the patient: teammate DH spoke with patient's daughter today who said that after today (6/6) fill at [facility], patient will get mycophenolate directly from mfg, no longer from [facility]. Patient also voiced this to coordinator MM. Clinic team aware of disenrollment per protocol.    Christine Cozier, PharmD  [facility] Specialty Pharmacist

## 2024-01-09 ENCOUNTER — Other Ambulatory Visit: Payer: Self-pay

## 2024-01-09 DIAGNOSIS — N138 Other obstructive and reflux uropathy: Secondary | ICD-10-CM

## 2024-01-10 ENCOUNTER — Other Ambulatory Visit: Payer: Self-pay

## 2024-01-10 DIAGNOSIS — N138 Other obstructive and reflux uropathy: Secondary | ICD-10-CM

## 2024-01-10 DIAGNOSIS — N401 Enlarged prostate with lower urinary tract symptoms: Secondary | ICD-10-CM | POA: Diagnosis not present

## 2024-01-11 LAB — PSA: Prostate Specific Ag, Serum: 0.2 ng/mL (ref 0.0–4.0)

## 2024-01-17 ENCOUNTER — Ambulatory Visit: Payer: Self-pay | Admitting: Urology

## 2024-01-17 DIAGNOSIS — Z940 Kidney transplant status: Principal | ICD-10-CM

## 2024-01-17 MED ORDER — CHOLECALCIFEROL (VITAMIN D3) 50 MCG (2,000 UNIT) CAPSULE
ORAL_CAPSULE | Freq: Every day | ORAL | 3 refills | 90.00000 days | Status: CP
Start: 2024-01-17 — End: 2025-01-16

## 2024-01-17 MED ORDER — TACROLIMUS 0.5 MG CAPSULE, IMMEDIATE-RELEASE
ORAL_CAPSULE | Freq: Every evening | ORAL | 3 refills | 90.00000 days | Status: CP
Start: 2024-01-17 — End: 2025-01-16

## 2024-01-17 MED ORDER — TACROLIMUS 1 MG CAPSULE, IMMEDIATE-RELEASE
ORAL_CAPSULE | Freq: Every day | ORAL | 3 refills | 90.00000 days | Status: CP
Start: 2024-01-17 — End: 2025-01-16

## 2024-01-17 MED ORDER — TAMSULOSIN 0.4 MG CAPSULE
ORAL_CAPSULE | Freq: Two times a day (BID) | ORAL | 3 refills | 90.00000 days | Status: CP
Start: 2024-01-17 — End: 2025-01-16

## 2024-01-18 DIAGNOSIS — Z1211 Encounter for screening for malignant neoplasm of colon: Secondary | ICD-10-CM | POA: Diagnosis not present

## 2024-01-18 DIAGNOSIS — R1013 Epigastric pain: Secondary | ICD-10-CM | POA: Diagnosis not present

## 2024-01-18 DIAGNOSIS — K219 Gastro-esophageal reflux disease without esophagitis: Secondary | ICD-10-CM | POA: Diagnosis not present

## 2024-01-18 DIAGNOSIS — R198 Other specified symptoms and signs involving the digestive system and abdomen: Secondary | ICD-10-CM | POA: Diagnosis not present

## 2024-01-18 DIAGNOSIS — R6881 Early satiety: Secondary | ICD-10-CM | POA: Diagnosis not present

## 2024-01-18 DIAGNOSIS — R140 Abdominal distension (gaseous): Secondary | ICD-10-CM | POA: Diagnosis not present

## 2024-01-20 ENCOUNTER — Encounter: Payer: Self-pay | Admitting: Cardiovascular Disease

## 2024-01-23 ENCOUNTER — Encounter: Payer: Self-pay | Admitting: Cardiovascular Disease

## 2024-01-23 ENCOUNTER — Ambulatory Visit: Admitting: Cardiovascular Disease

## 2024-01-23 VITALS — BP 141/69 | HR 53 | Ht 68.0 in | Wt 170.0 lb

## 2024-01-23 DIAGNOSIS — I509 Heart failure, unspecified: Secondary | ICD-10-CM

## 2024-01-23 DIAGNOSIS — I10 Essential (primary) hypertension: Secondary | ICD-10-CM | POA: Diagnosis not present

## 2024-01-23 DIAGNOSIS — Z8679 Personal history of other diseases of the circulatory system: Secondary | ICD-10-CM

## 2024-01-23 DIAGNOSIS — R001 Bradycardia, unspecified: Secondary | ICD-10-CM

## 2024-01-23 DIAGNOSIS — I25708 Atherosclerosis of coronary artery bypass graft(s), unspecified, with other forms of angina pectoris: Secondary | ICD-10-CM

## 2024-01-23 DIAGNOSIS — E7800 Pure hypercholesterolemia, unspecified: Secondary | ICD-10-CM | POA: Diagnosis not present

## 2024-01-23 MED ORDER — HYDRALAZINE HCL 25 MG PO TABS: 25.0000 mg | ORAL_TABLET | Freq: Two times a day (BID) | ORAL | 11 refills | Status: DC

## 2024-01-23 NOTE — Progress Notes (Signed)
 Cardiology Office Note   Date:  01/23/2024   ID:  Mansour, Balboa 1956-03-02, MRN [id number]  PCP:  Abbey Bruckner, MD  Cardiologist:  Denyse Bathe, MD      History of Present Illness: [name] is a 68 y.o. male who presents for No chief complaint on file.   Has been running high BP      Past Medical History:  Diagnosis Date   Acute exacerbation of CHF (congestive heart failure) (HCC) 12/16/2023   Aortic atherosclerosis (HCC)    Arthritis    BPH with LUTS s/p UroLift with Dr. Penne in November 2024 on finasteride , Flomax , and Sanctura   and [facility]    BPH with LUTS, s/p urolift with Dr. Penne 06/2023, on Finasteride , Flomax , Sancture, sees    Carotid arterial disease (HCC)    a.) s/p LEFT CEA on 09/06/2019   Chest pain 09/20/2017   Coronary artery disease    a.) MI with stents x 2 in 2004; b.) MI with stent x 1 2005; c.) s/p CABG x 2 06/02/2006 (LIMA-LAD, SVG-PDA); d.) LHC/PCI 08/25/2007: 80% oD1 (2.5 x 12 mm Xience V DES)   DDD (degenerative disc disease), lumbar    Dyspnea    ESRD (end stage renal disease) (HCC)    a.) s/p RIGHT renal 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/2015   GERD (gastroesophageal reflux disease)    History of blood transfusion    Hypertension    Long term current use of aspirin     Long term current use of immunosuppressive drug    a.) mycophenolate  + tacrolimus    Myocardial infarction [facility]) 2004   a.) details unclear; stents x 2 (unknown type/location)   Myocardial infarction (HCC) 2005   a.) details unclear; stent x 1 (unknown type/location)   On apixaban  therapy    PAF (paroxysmal atrial fibrillation) (HCC)    a.) CHA2DS2-VASc = 5 (age, HTN, vascular disease history/MI, T2DM) as of 06/10/2023; b.) cardiac rate/rhythm maintained intrinsically without pharmacological intervention; chronically anticoagulated using apixaban    Sepsis due to gram-negative UTI (HCC) 04/27/2020   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   From DM  and HTN, f/u with [facility] every 6 months   Subconjunctival hemorrhage of right eye 12/15/2023   T2DM (type 2 diabetes mellitus) (HCC)    Unstable angina (HCC)    Vitamin D deficiency      Past Surgical History:  Procedure Laterality Date   CATARACT EXTRACTION Bilateral    CATARACT EXTRACTION W/PHACO Right 02/03/2017   Procedure: CATARACT EXTRACTION PHACO AND INTRAOCULAR LENS PLACEMENT (IOC);  Surgeon: Mittie Gaskin, MD;  Location: [facility] ORS;  Service: Ophthalmology;  Laterality: Right;  US  00:35.8 AP% 12.8 CDE 4.57 Fluid lot # [id number]   COLONOSCOPY WITH PROPOFOL  N/A 12/30/2021   Procedure: COLONOSCOPY WITH PROPOFOL ;  Surgeon: Therisa Bi, MD;  Location: [facility] ENDOSCOPY;  Service: Gastroenterology;  Laterality: N/A;   CORONARY ANGIOPLASTY WITH STENT PLACEMENT Left 2004   stents x 2   CORONARY ANGIOPLASTY WITH STENT PLACEMENT Left 2005   stent x 1   CORONARY ANGIOPLASTY WITH STENT PLACEMENT Left 08/25/2007   Procedure: CORONARY ANGIOPLASTY WITH STENT PLACEMENT; Location: [facility]; Surgeon: Valinda Cage, MD   CORONARY ARTERY BYPASS GRAFT N/A 06/08/2006   Procedure: CORONARY ARTERY BYPASS GRAFT; Location: [facility]; Surgeon: Maude Sharps, MD   CYSTOSCOPY WITH INSERTION OF UROLIFT N/A 06/13/2023   Procedure: CYSTOSCOPY WITH INSERTION OF UROLIFT;  Surgeon: Penne Knee, MD;  Location: [facility] ORS;  Service: Urology;  Laterality: N/A;  ENDARTERECTOMY Left 09/06/2019   Procedure: ENDARTERECTOMY CAROTID;  Surgeon: Marea Selinda RAMAN, MD;  Location: [facility] ORS;  Service: Vascular;  Laterality: Left;   HIP FRACTURE SURGERY     KIDNEY TRANSPLANT Right 05/10/2015   LEFT HEART CATH AND CORONARY ANGIOGRAPHY Left 05/20/2006   Procedure: LEFT HEART CATH AND CORONARY ANGIOGRAPHY; Location: [facility]; Surgeon: Denyse Bathe, MD   LEFT HEART CATH AND CORONARY ANGIOGRAPHY Left 11/08/2023   Procedure: LEFT HEART CATH AND CORONARY ANGIOGRAPHY with possible intervention;  Surgeon: Bathe Denyse LABOR, MD;  Location: [facility] INVASIVE  CV LAB;  Service: Cardiovascular;  Laterality: Left;   LEFT HEART CATH AND CORS/GRAFTS ANGIOGRAPHY Left 08/10/2007   Procedure: LEFT HEART CATH AND CORS/GRAFTS ANGIOGRAPHY; Location: [facility]; Surgeon: Denyse Bathe, MD   LEFT HEART CATH AND CORS/GRAFTS ANGIOGRAPHY Left 05/04/2011   Procedure: LEFT HEART CATH AND CORS/GRAFTS ANGIOGRAPHY; Location: [facility]; Surgeon: Denyse Bathe, MD     Current Outpatient Medications  Medication Sig Dispense Refill   amiodarone  (PACERONE ) 200 MG tablet Take 1 tablet (200 mg total) by mouth daily. 90 tablet 1   ascorbic acid (VITAMIN C) 500 MG tablet Take 500 mg by mouth daily.     aspirin  EC 81 MG EC tablet Take 1 tablet (81 mg total) by mouth daily at 6 (six) AM.     atorvastatin  (LIPITOR) 40 MG tablet Take 40 mg by mouth at bedtime.     BOOSTRIX 5-2.5-18.5 LF-MCG/0.5 injection Inject 0.5 mLs into the muscle once.     CELLCEPT  250 MG capsule Take 500 mg by mouth 2 (two) times daily.     Cholecalciferol  (VITAMIN D) 50 MCG (2000 UT) CAPS Take 2,000 Units by mouth daily.     Continuous Blood Gluc Sensor (FREESTYLE LIBRE 2 SENSOR) MISC by Does not apply route.     Continuous Glucose Sensor (FREESTYLE LIBRE 2 PLUS SENSOR) MISC 1 each by Other route every 14 (fourteen) days.     ELIQUIS  5 MG TABS tablet Take 5 mg by mouth 2 (two) times daily.      finasteride  (PROSCAR ) 5 MG tablet TAKE 1 TABLET BY MOUTH ONCE DAILY. 90 tablet 0   furosemide  (LASIX ) 20 MG tablet Take 1 tablet (20 mg total) by mouth daily. 90 tablet 1   HUMALOG KWIKPEN 100 UNIT/ML KiwkPen Inject 10-12 Units into the skin 3 (three) times daily. Inject 10 units daily at breakfast, 10 units at lunch and up to 10 units or less at supper per SSI     hydrALAZINE  (APRESOLINE ) 25 MG tablet Take 1 tablet (25 mg total) by mouth 2 (two) times daily. 120 tablet 11   insulin  glargine (LANTUS  SOLOSTAR) 100 UNIT/ML Solostar Pen Inject 30 Units into the skin at bedtime.     Insulin  Pen Needle 33G X 4 MM MISC To use with  insulin  pen 4 times per day. E 13.9     LANTUS  SOLOSTAR 100 UNIT/ML Solostar Pen Inject 24 Units into the skin at bedtime.      linagliptin (TRADJENTA) 5 MG TABS tablet Take 5 mg by mouth daily.     losartan  (COZAAR ) 100 MG tablet Take 1 tablet (100 mg total) by mouth daily. 30 tablet 11   nitroGLYCERIN  (NITROSTAT ) 0.4 MG SL tablet Place 1 tablet (0.4 mg total) under the tongue every 5 (five) minutes as needed for chest pain. 100 tablet 3   omeprazole (PRILOSEC) 20 MG capsule Take 20 mg by mouth daily.     PROGRAF  1 MG capsule Take 1 mg by mouth  every morning.     SHINGRIX injection Inject 0.5 mLs into the muscle once.     sodium chloride  (OCEAN) 0.65 % nasal spray Place 1 spray into the nose as needed (dry nose related to cpap use). 30 mL 12   tacrolimus  (PROGRAF ) 0.5 MG capsule Take 0.5 mg by mouth at bedtime.     tamsulosin  (FLOMAX ) 0.4 MG CAPS capsule Take 0.8 mg by mouth daily.     No current facility-administered medications for this visit.   Facility-Administered Medications Ordered in Other Visits  Medication Dose Route Frequency Provider Last Rate Last Admin   sodium chloride  flush (NS) 0.9 % injection 3 mL  3 mL Intravenous Q12H Fernand Denyse LABOR, MD        Allergies:   Oxybutynin     Social History:   reports that he has quit smoking. He has never used smokeless tobacco. He reports that he does not drink alcohol and does not use drugs.   Family History:  family history includes Drug abuse in his brother and sister; Osteoporosis in his mother.    ROS:     Review of Systems  Constitutional: Negative.   HENT: Negative.    Eyes: Negative.   Respiratory: Negative.    Gastrointestinal: Negative.   Genitourinary: Negative.   Musculoskeletal: Negative.   Skin: Negative.   Neurological: Negative.   Endo/Heme/Allergies: Negative.   Psychiatric/Behavioral: Negative.    All other systems reviewed and are negative.     All other systems are reviewed and negative.    PHYSICAL  EXAM: VS:  BP (!) 141/69   Pulse (!) 53   Ht 5' 8 (1.727 m)   Wt 170 lb (77.1 kg)   SpO2 95%   BMI 25.85 kg/m  , BMI Body mass index is 25.85 kg/m. Last weight:  Wt Readings from Last 3 Encounters:  01/23/24 170 lb (77.1 kg)  12/29/23 171 lb (77.6 kg)  12/26/23 171 lb 12.8 oz (77.9 kg)     Physical Exam Vitals reviewed.  Constitutional:      Appearance: Normal appearance. He is normal weight.  HENT:     Head: Normocephalic.     Nose: Nose normal.     Mouth/Throat:     Mouth: Mucous membranes are moist.   Eyes:     Pupils: Pupils are equal, round, and reactive to light.    Cardiovascular:     Rate and Rhythm: Normal rate and regular rhythm.     Pulses: Normal pulses.     Heart sounds: Normal heart sounds.  Pulmonary:     Effort: Pulmonary effort is normal.  Abdominal:     General: Abdomen is flat. Bowel sounds are normal.   Musculoskeletal:        General: Normal range of motion.     Cervical back: Normal range of motion.   Skin:    General: Skin is warm.   Neurological:     General: No focal deficit present.     Mental Status: He is alert.   Psychiatric:        Mood and Affect: Mood normal.       EKG:   Recent Labs: 12/16/2023: B Natriuretic Peptide 684.4; TSH 2.987 12/18/2023: ALT 18; Hemoglobin 14.5; Magnesium  2.0; Platelets 145 12/21/2023: BUN 37; Creatinine, Ser 1.58; Potassium 4.3; Sodium 137    Lipid Panel    Component Value Date/Time   CHOL  10/20/2010 0400    94        ATP III  CLASSIFICATION:  <200     mg/dL   Desirable  799-760  mg/dL   Borderline High  >=759    mg/dL   High          TRIG 845 (H) 10/23/2010 0600      Other studies Reviewed: Additional studies/ records that were reviewed today include:  Review of the above records demonstrates:       No data to display            ASSESSMENT AND PLAN:    ICD-10-CM   1. Sinus bradycardia  R00.1 hydrALAZINE  (APRESOLINE ) 25 MG tablet    2. Atrial fibrillation,  currently in sinus rhythm  Z86.79 hydrALAZINE  (APRESOLINE ) 25 MG tablet    3. Coronary artery disease involving coronary bypass graft of native heart with other forms of angina pectoris (HCC)  I25.708 hydrALAZINE  (APRESOLINE ) 25 MG tablet    4. Heart failure, unspecified HF chronicity, unspecified heart failure type (HCC)  I50.9 hydrALAZINE  (APRESOLINE ) 25 MG tablet    5. Hypercholesteremia  E78.00 hydrALAZINE  (APRESOLINE ) 25 MG tablet    6. Essential (primary) hypertension  I10 hydrALAZINE  (APRESOLINE ) 25 MG tablet   Add hydralazine  25 bid as BP high.       Problem List Items Addressed This Visit       Cardiovascular and Mediastinum   Essential (primary) hypertension (Chronic)   Relevant Medications   hydrALAZINE  (APRESOLINE ) 25 MG tablet   Coronary artery disease (Chronic)   Relevant Medications   hydrALAZINE  (APRESOLINE ) 25 MG tablet     Other   Hypercholesteremia   Relevant Medications   hydrALAZINE  (APRESOLINE ) 25 MG tablet   Other Visit Diagnoses       Sinus bradycardia    -  Primary   Relevant Medications   hydrALAZINE  (APRESOLINE ) 25 MG tablet     Atrial fibrillation, currently in sinus rhythm       Relevant Medications   hydrALAZINE  (APRESOLINE ) 25 MG tablet     Heart failure, unspecified HF chronicity, unspecified heart failure type (HCC)       Relevant Medications   hydrALAZINE  (APRESOLINE ) 25 MG tablet          Disposition:   Return in about 4 weeks (around 02/20/2024).    Total time spent: 30 minutes  Signed,  Denyse Bathe, MD  01/23/2024 10:42 AM    [facility]

## 2024-01-24 ENCOUNTER — Ambulatory Visit: Admitting: Urology

## 2024-01-24 VITALS — BP 149/57 | HR 51 | Ht 68.0 in | Wt 168.0 lb

## 2024-01-24 DIAGNOSIS — R351 Nocturia: Secondary | ICD-10-CM | POA: Diagnosis not present

## 2024-01-24 DIAGNOSIS — N401 Enlarged prostate with lower urinary tract symptoms: Secondary | ICD-10-CM | POA: Diagnosis not present

## 2024-01-24 DIAGNOSIS — N138 Other obstructive and reflux uropathy: Secondary | ICD-10-CM

## 2024-01-24 LAB — BLADDER SCAN AMB NON-IMAGING

## 2024-01-24 MED ORDER — FINASTERIDE 5 MG PO TABS: 5.0000 mg | ORAL_TABLET | Freq: Every day | ORAL | 3 refills | Status: AC

## 2024-01-24 MED ORDER — TAMSULOSIN HCL 0.4 MG PO CAPS: 0.8000 mg | ORAL_CAPSULE | Freq: Every day | ORAL | 3 refills | Status: DC

## 2024-01-24 MED ORDER — TROSPIUM CHLORIDE 20 MG PO TABS: 20.0000 mg | ORAL_TABLET | Freq: Every day | ORAL | 3 refills | Status: DC

## 2024-01-24 NOTE — Progress Notes (Signed)
 01/24/2024 6:21 PM   [name] Jan 24, 1956 [id number]  Referring provider: Adina Buel CHRISTELLA, MD [address] [place],  [place] [address]  Urological history: 1. BPH with LU TS - PSA (01/2024) 0.2 - TRUS (2022) 43 cc - cysto (2022) NED - UDS (2024) non diagnostic - UroLift (06/2023)  - managed on finasteride  5 mg daily, tamsulosin  0.8 mg daily and trospium  20 mg at bedtime  2. Nocturia - oxybutynin  caused retention/severe constipation   3. Renal transplant  - serum creatinine (01/2024) 1.18, eGFR 67 - right renal transplant (2016)  - [facility] by [facility]  4. ED  5. Pyelonephritis  - hospitalized (2021)  Chief Complaint  Patient presents with   Benign Prostatic Hypertrophy    HPI: [name] is a 68 y.o. man who presents today for 6 month follow up.    Previous records reviewed.   IPSS score: 14/2  PVR: 0 mL   Previous score: 6/2   Previous PVR: 0 mL  Major complaint(s): nocturia x5  x 5 years. Denies any dysuria, hematuria or suprapubic pain.   Currently taking: finasteride  5 mg daily and tamsulosin  0.8 mg   He has had UroLift.   Denies any recent fevers, chills, nausea or vomiting.  Serum creatinine (01/2024) 1.18, eGFR 67  PSA (01/2024) 0.2  UA (01/2024) negative for nitrites, leukocytes, blood and bacteria     IPSS     Row Name 01/24/24 1000         International Prostate Symptom Score   How often have you had the sensation of not emptying your bladder? Not at All     How often have you had to urinate less than every two hours? Almost always     How often have you found you stopped and started again several times when you urinated? More than half the time     How often have you found it difficult to postpone urination? Not at All     How often have you had a weak urinary stream? Not at All     How often have you had to strain to start urination? Not at All     How many times did you typically get up at night to urinate? 5 Times      Total IPSS Score 14       Quality of Life due to urinary symptoms   If you were to spend the rest of your life with your urinary condition just the way it is now how would you feel about that? Mostly Satisfied        Score:  1-7 Mild 8-19 Moderate 20-35 Severe    PMH: Past Medical History:  Diagnosis Date   Acute exacerbation of CHF (congestive heart failure) (HCC) 12/16/2023   Aortic atherosclerosis (HCC)    Arthritis    BPH with LUTS s/p UroLift with Dr. Penne in November 2024 on finasteride , Flomax , and Sanctura   and [facility]    BPH with LUTS, s/p urolift with Dr. Penne 06/2023, on Finasteride , Flomax , Sancture, sees    Carotid arterial disease (HCC)    a.) s/p LEFT CEA on 09/06/2019   Chest pain 09/20/2017   Coronary artery disease    a.) MI with stents x 2 in 2004; b.) MI with stent x 1 2005; c.) s/p CABG x 2 06/02/2006 (LIMA-LAD, SVG-PDA); d.) LHC/PCI 08/25/2007: 80% oD1 (2.5 x 12 mm Xience V DES)   DDD (degenerative disc disease), lumbar  Dyspnea    ESRD (end stage renal disease) (HCC)    a.) s/p RIGHT renal 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/2015   GERD (gastroesophageal reflux disease)    History of blood transfusion    Hypertension    Long term current use of aspirin     Long term current use of immunosuppressive drug    a.) mycophenolate  + tacrolimus    Myocardial infarction [facility]) 2004   a.) details unclear; stents x 2 (unknown type/location)   Myocardial infarction (HCC) 2005   a.) details unclear; stent x 1 (unknown type/location)   On apixaban  therapy    PAF (paroxysmal atrial fibrillation) (HCC)    a.) CHA2DS2-VASc = 5 (age, HTN, vascular disease history/MI, T2DM) as of 06/10/2023; b.) cardiac rate/rhythm maintained intrinsically without pharmacological intervention; chronically anticoagulated using apixaban    Sepsis due to gram-negative UTI (HCC) 04/27/2020   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   From DM and HTN, f/u with [facility] every 6 months    Subconjunctival hemorrhage of right eye 12/15/2023   T2DM (type 2 diabetes mellitus) (HCC)    Unstable angina (HCC)    Vitamin D deficiency     Surgical History: Past Surgical History:  Procedure Laterality Date   CATARACT EXTRACTION Bilateral    CATARACT EXTRACTION W/PHACO Right 02/03/2017   Procedure: CATARACT EXTRACTION PHACO AND INTRAOCULAR LENS PLACEMENT (IOC);  Surgeon: Mittie Gaskin, MD;  Location: [facility] ORS;  Service: Ophthalmology;  Laterality: Right;  US  00:35.8 AP% 12.8 CDE 4.57 Fluid lot # [id number]   COLONOSCOPY WITH PROPOFOL  N/A 12/30/2021   Procedure: COLONOSCOPY WITH PROPOFOL ;  Surgeon: Therisa Bi, MD;  Location: [facility] ENDOSCOPY;  Service: Gastroenterology;  Laterality: N/A;   CORONARY ANGIOPLASTY WITH STENT PLACEMENT Left 2004   stents x 2   CORONARY ANGIOPLASTY WITH STENT PLACEMENT Left 2005   stent x 1   CORONARY ANGIOPLASTY WITH STENT PLACEMENT Left 08/25/2007   Procedure: CORONARY ANGIOPLASTY WITH STENT PLACEMENT; Location: [facility]; Surgeon: Valinda Cage, MD   CORONARY ARTERY BYPASS GRAFT N/A 06/08/2006   Procedure: CORONARY ARTERY BYPASS GRAFT; Location: [facility]; Surgeon: Maude Sharps, MD   CYSTOSCOPY WITH INSERTION OF UROLIFT N/A 06/13/2023   Procedure: CYSTOSCOPY WITH INSERTION OF UROLIFT;  Surgeon: Penne Knee, MD;  Location: [facility] ORS;  Service: Urology;  Laterality: N/A;   ENDARTERECTOMY Left 09/06/2019   Procedure: ENDARTERECTOMY CAROTID;  Surgeon: Marea Selinda RAMAN, MD;  Location: [facility] ORS;  Service: Vascular;  Laterality: Left;   HIP FRACTURE SURGERY     KIDNEY TRANSPLANT Right 05/10/2015   LEFT HEART CATH AND CORONARY ANGIOGRAPHY Left 05/20/2006   Procedure: LEFT HEART CATH AND CORONARY ANGIOGRAPHY; Location: [facility]; Surgeon: Denyse Bathe, MD   LEFT HEART CATH AND CORONARY ANGIOGRAPHY Left 11/08/2023   Procedure: LEFT HEART CATH AND CORONARY ANGIOGRAPHY with possible intervention;  Surgeon: Bathe Denyse LABOR, MD;  Location: [facility] INVASIVE CV LAB;  Service:  Cardiovascular;  Laterality: Left;   LEFT HEART CATH AND CORS/GRAFTS ANGIOGRAPHY Left 08/10/2007   Procedure: LEFT HEART CATH AND CORS/GRAFTS ANGIOGRAPHY; Location: [facility]; Surgeon: Denyse Bathe, MD   LEFT HEART CATH AND CORS/GRAFTS ANGIOGRAPHY Left 05/04/2011   Procedure: LEFT HEART CATH AND CORS/GRAFTS ANGIOGRAPHY; Location: [facility]; Surgeon: Denyse Bathe, MD    Home Medications:  Allergies as of 01/24/2024       Reactions   Oxybutynin  Other (See Comments)   Incomplete bladder emptying        Medication List        Accurate as of January 24, 2024 11:59 PM. If you have any questions,  ask your nurse or doctor.          amiodarone  200 MG tablet Commonly known as: PACERONE  Take 1 tablet (200 mg total) by mouth daily.   ascorbic acid 500 MG tablet Commonly known as: VITAMIN C Take 500 mg by mouth daily.   aspirin  EC 81 MG tablet Take 1 tablet (81 mg total) by mouth daily at 6 (six) AM.   atorvastatin  40 MG tablet Commonly known as: LIPITOR Take 40 mg by mouth at bedtime.   Boostrix 5-2.5-18.5 LF-MCG/0.5 injection Generic drug: Tdap Inject 0.5 mLs into the muscle once.   CellCept  250 MG capsule Generic drug: mycophenolate  Take 500 mg by mouth 2 (two) times daily.   Eliquis  5 MG Tabs tablet Generic drug: apixaban  Take 5 mg by mouth 2 (two) times daily.   finasteride  5 MG tablet Commonly known as: PROSCAR  Take 1 tablet (5 mg total) by mouth daily.   FreeStyle Libre 2 Sensor Misc by Does not apply route.   FreeStyle Libre 2 Plus Sensor Misc 1 each by Other route every 14 (fourteen) days.   furosemide  20 MG tablet Commonly known as: LASIX  Take 1 tablet (20 mg total) by mouth daily.   HumaLOG KwikPen 100 UNIT/ML KwikPen Generic drug: insulin  lispro Inject 10-12 Units into the skin 3 (three) times daily. Inject 10 units daily at breakfast, 10 units at lunch and up to 10 units or less at supper per SSI   hydrALAZINE  25 MG tablet Commonly known as: APRESOLINE  Take  1 tablet (25 mg total) by mouth 2 (two) times daily.   Insulin  Pen Needle 33G X 4 MM Misc To use with insulin  pen 4 times per day. E 13.9   Lantus  SoloStar 100 UNIT/ML Solostar Pen Generic drug: insulin  glargine Inject 24 Units into the skin at bedtime.   Lantus  SoloStar 100 UNIT/ML Solostar Pen Generic drug: insulin  glargine Inject 30 Units into the skin at bedtime.   linagliptin 5 MG Tabs tablet Commonly known as: TRADJENTA Take 5 mg by mouth daily.   losartan  100 MG tablet Commonly known as: Cozaar  Take 1 tablet (100 mg total) by mouth daily.   nitroGLYCERIN  0.4 MG SL tablet Commonly known as: Nitrostat  Place 1 tablet (0.4 mg total) under the tongue every 5 (five) minutes as needed for chest pain.   omeprazole 20 MG capsule Commonly known as: PRILOSEC Take 20 mg by mouth daily.   Prograf  1 MG capsule Generic drug: tacrolimus  Take 1 mg by mouth every morning.   tacrolimus  0.5 MG capsule Commonly known as: PROGRAF  Take 0.5 mg by mouth at bedtime.   Shingrix injection Generic drug: Zoster Vaccine Adjuvanted Inject 0.5 mLs into the muscle once.   sodium chloride  0.65 % nasal spray Commonly known as: OCEAN Place 1 spray into the nose as needed (dry nose related to cpap use).   tamsulosin  0.4 MG Caps capsule Commonly known as: FLOMAX  Take 2 capsules (0.8 mg total) by mouth daily.   trospium  20 MG tablet Commonly known as: SANCTURA  Take 1 tablet (20 mg total) by mouth at bedtime.   Vitamin D 50 MCG (2000 UT) Caps Take 2,000 Units by mouth daily.        Allergies:  Allergies  Allergen Reactions   Oxybutynin  Other (See Comments)    Incomplete bladder emptying    Family History: Family History  Problem Relation Age of Onset   Osteoporosis Mother    Drug abuse Sister    Drug abuse Brother  Social History:  reports that he has quit smoking. He has never used smokeless tobacco. He reports that he does not drink alcohol and does not use  drugs.  ROS: Pertinent ROS in HPI  Physical Exam: BP (!) 149/57   Pulse (!) 51   Ht 5' 8 (1.727 m)   Wt 168 lb (76.2 kg)   BMI 25.54 kg/m   Constitutional:  Well nourished. Alert and oriented, No acute distress. HEENT: [place] AT, moist mucus membranes.  Trachea midline, no masses. Cardiovascular: No clubbing, cyanosis, or edema. Respiratory: Normal respiratory effort, no increased work of breathing. Neurologic: Grossly intact, no focal deficits, moving all 4 extremities. Psychiatric: Normal mood and affect.  Laboratory Data: See EPIC and HPI I have reviewed the labs.  See HPI.     Pertinent Imaging:  01/24/24 10:34  Scan Result 0ml   Assessment & Plan:    1. BPH with LU TS - stable symptoms  - no signs of retention, infection or malignancy  - PSA up to date  - UA benign  - PVR < 300 cc  - most bothersome symptoms are nocturia x 5 - encouraged avoiding bladder irritants, fluid restriction before bedtime and timed voiding's - Continue tamsulosin  0.8 mg daily and finasteride  5 mg daily - s/p UroLift (2024) - UDS (2024) was non-diagnostic, but daytime symptoms responding to UroLift, finasteride  and tamsulosin  - educated on red flag symptoms: acute retention, gross hematuria, fever, severe pain - advised to call clinic or go to the ED if these occur - return to clinic in 6 weeks symptom re-evaluation   2. Nocturia - Risk factors of hypertension, diabetes, heart disease and BPH - restart trospium  20 mg at bedtime  - Reassess in 6 weeks with IPSS and PVR  Return in about 6 weeks (around 03/06/2024) for IPSS and PVR.  These notes generated with voice recognition software. I apologize for typographical errors.  [name]  [facility] [address]  [address] [place], [place] [address] [phone]

## 2024-01-27 ENCOUNTER — Encounter: Payer: Self-pay | Admitting: Urology

## 2024-01-27 DIAGNOSIS — H35372 Puckering of macula, left eye: Secondary | ICD-10-CM | POA: Diagnosis not present

## 2024-01-27 DIAGNOSIS — Z961 Presence of intraocular lens: Secondary | ICD-10-CM | POA: Diagnosis not present

## 2024-01-27 DIAGNOSIS — H26492 Other secondary cataract, left eye: Secondary | ICD-10-CM | POA: Diagnosis not present

## 2024-01-27 DIAGNOSIS — E113553 Type 2 diabetes mellitus with stable proliferative diabetic retinopathy, bilateral: Secondary | ICD-10-CM | POA: Diagnosis not present

## 2024-01-27 LAB — HM DIABETES EYE EXAM

## 2024-01-28 NOTE — Anesthesia Preprocedure Evaluation (Signed)
 Anesthesia Evaluation  Patient identified by MRN, date of birth, ID band Patient awake    Reviewed: Allergy & Precautions, NPO status , Patient's Chart, lab work & pertinent test results  History of Anesthesia Complications Negative for: history of anesthetic complications  Airway Mallampati: II   Neck ROM: Full    Dental  (+) Missing, Chipped   Pulmonary former smoker (quit 1998)   Pulmonary exam normal breath sounds clear to auscultation       Cardiovascular hypertension, + CAD (s/p MI, stents, CABG) and +CHF  Normal cardiovascular exam+ dysrhythmias (a fib on Eliquis )  Rhythm:Regular Rate:Normal  ECG 12/19/23:  Sinus rhythm with 1st degree A-V block Cannot rule out Anterior infarct , age undetermined T wave abnormality, consider inferior ischemia Unchanged from prior   Neuro/Psych negative neurological ROS     GI/Hepatic ,GERD  ,,  Endo/Other  diabetes, Type 2    Renal/GU Renal disease (s/p right renal transplant 2016)     Musculoskeletal  (+) Arthritis ,    Abdominal   Peds  Hematology negative hematology ROS (+)   Anesthesia Other Findings   Reproductive/Obstetrics                             Anesthesia Physical Anesthesia Plan  ASA: 3  Anesthesia Plan: General   Post-op Pain Management:    Induction: Intravenous  PONV Risk Score and Plan: 2 and Propofol  infusion, TIVA and Treatment may vary due to age or medical condition  Airway Management Planned: Natural Airway  Additional Equipment:   Intra-op Plan:   Post-operative Plan:   Informed Consent: I have reviewed the patients History and Physical, chart, labs and discussed the procedure including the risks, benefits and alternatives for the proposed anesthesia with the patient or authorized representative who has indicated his/her understanding and acceptance.       Plan Discussed with: CRNA  Anesthesia Plan  Comments: (LMA/GETA backup discussed.  Patient consented for risks of anesthesia including but not limited to:  - adverse reactions to medications - damage to eyes, teeth, lips or other oral mucosa - nerve damage due to positioning  - sore throat or hoarseness - damage to heart, brain, nerves, lungs, other parts of body or loss of life  Informed patient about role of CRNA in peri- and intra-operative care.  Patient voiced understanding.)        Anesthesia Quick Evaluation

## 2024-01-30 ENCOUNTER — Other Ambulatory Visit: Payer: Self-pay

## 2024-01-30 ENCOUNTER — Ambulatory Visit: Payer: Self-pay | Admitting: Anesthesiology

## 2024-01-30 ENCOUNTER — Encounter: Payer: Self-pay | Admitting: Gastroenterology

## 2024-01-30 ENCOUNTER — Ambulatory Visit
Admission: RE | Admit: 2024-01-30 | Discharge: 2024-01-30 | Disposition: A | Discharge status: Home / Self Care | Attending: Gastroenterology | Admitting: Gastroenterology

## 2024-01-30 ENCOUNTER — Encounter
Admission: RE | Disposition: A | Discharge status: Home / Self Care | Payer: Self-pay | Source: Home / Self Care | Attending: Gastroenterology

## 2024-01-30 ENCOUNTER — Ambulatory Visit: Payer: Self-pay

## 2024-01-30 DIAGNOSIS — Z79624 Long term (current) use of inhibitors of nucleotide synthesis: Secondary | ICD-10-CM | POA: Insufficient documentation

## 2024-01-30 DIAGNOSIS — I110 Hypertensive heart disease with heart failure: Secondary | ICD-10-CM | POA: Insufficient documentation

## 2024-01-30 DIAGNOSIS — I2510 Atherosclerotic heart disease of native coronary artery without angina pectoris: Secondary | ICD-10-CM | POA: Diagnosis not present

## 2024-01-30 DIAGNOSIS — R1013 Epigastric pain: Secondary | ICD-10-CM | POA: Insufficient documentation

## 2024-01-30 DIAGNOSIS — K219 Gastro-esophageal reflux disease without esophagitis: Secondary | ICD-10-CM | POA: Diagnosis not present

## 2024-01-30 DIAGNOSIS — M1990 Unspecified osteoarthritis, unspecified site: Secondary | ICD-10-CM | POA: Diagnosis not present

## 2024-01-30 DIAGNOSIS — I440 Atrioventricular block, first degree: Secondary | ICD-10-CM | POA: Diagnosis not present

## 2024-01-30 DIAGNOSIS — Z7901 Long term (current) use of anticoagulants: Secondary | ICD-10-CM | POA: Diagnosis not present

## 2024-01-30 DIAGNOSIS — I4891 Unspecified atrial fibrillation: Secondary | ICD-10-CM | POA: Diagnosis not present

## 2024-01-30 DIAGNOSIS — Z940 Kidney transplant status: Secondary | ICD-10-CM | POA: Diagnosis not present

## 2024-01-30 DIAGNOSIS — I509 Heart failure, unspecified: Secondary | ICD-10-CM | POA: Insufficient documentation

## 2024-01-30 DIAGNOSIS — E1122 Type 2 diabetes mellitus with diabetic chronic kidney disease: Secondary | ICD-10-CM | POA: Insufficient documentation

## 2024-01-30 DIAGNOSIS — I480 Paroxysmal atrial fibrillation: Secondary | ICD-10-CM | POA: Insufficient documentation

## 2024-01-30 DIAGNOSIS — K635 Polyp of colon: Secondary | ICD-10-CM | POA: Diagnosis not present

## 2024-01-30 DIAGNOSIS — Z79621 Long term (current) use of calcineurin inhibitor: Secondary | ICD-10-CM | POA: Insufficient documentation

## 2024-01-30 DIAGNOSIS — D122 Benign neoplasm of ascending colon: Secondary | ICD-10-CM | POA: Insufficient documentation

## 2024-01-30 DIAGNOSIS — Z1211 Encounter for screening for malignant neoplasm of colon: Secondary | ICD-10-CM | POA: Insufficient documentation

## 2024-01-30 DIAGNOSIS — Z79899 Other long term (current) drug therapy: Secondary | ICD-10-CM | POA: Insufficient documentation

## 2024-01-30 DIAGNOSIS — Z955 Presence of coronary angioplasty implant and graft: Secondary | ICD-10-CM | POA: Insufficient documentation

## 2024-01-30 DIAGNOSIS — Z951 Presence of aortocoronary bypass graft: Secondary | ICD-10-CM | POA: Diagnosis not present

## 2024-01-30 DIAGNOSIS — K2960 Other gastritis without bleeding: Secondary | ICD-10-CM | POA: Diagnosis not present

## 2024-01-30 DIAGNOSIS — D126 Benign neoplasm of colon, unspecified: Secondary | ICD-10-CM

## 2024-01-30 DIAGNOSIS — K2950 Unspecified chronic gastritis without bleeding: Secondary | ICD-10-CM | POA: Diagnosis not present

## 2024-01-30 DIAGNOSIS — Z87891 Personal history of nicotine dependence: Secondary | ICD-10-CM | POA: Diagnosis not present

## 2024-01-30 DIAGNOSIS — I252 Old myocardial infarction: Secondary | ICD-10-CM | POA: Insufficient documentation

## 2024-01-30 DIAGNOSIS — Z7984 Long term (current) use of oral hypoglycemic drugs: Secondary | ICD-10-CM | POA: Diagnosis not present

## 2024-01-30 DIAGNOSIS — Z794 Long term (current) use of insulin: Secondary | ICD-10-CM | POA: Insufficient documentation

## 2024-01-30 DIAGNOSIS — Z7982 Long term (current) use of aspirin: Secondary | ICD-10-CM | POA: Insufficient documentation

## 2024-01-30 DIAGNOSIS — E119 Type 2 diabetes mellitus without complications: Secondary | ICD-10-CM

## 2024-01-30 DIAGNOSIS — K2970 Gastritis, unspecified, without bleeding: Secondary | ICD-10-CM | POA: Diagnosis not present

## 2024-01-30 HISTORY — PX: POLYPECTOMY: SHX149

## 2024-01-30 HISTORY — PX: COLONOSCOPY: SHX5424

## 2024-01-30 HISTORY — DX: Benign neoplasm of colon, unspecified: D12.6

## 2024-01-30 HISTORY — DX: Encounter for screening for malignant neoplasm of colon: Z12.11

## 2024-01-30 HISTORY — DX: Epigastric pain: R10.13

## 2024-01-30 HISTORY — PX: ESOPHAGOGASTRODUODENOSCOPY: SHX5428

## 2024-01-30 LAB — GLUCOSE, CAPILLARY: Glucose-Capillary: 97 mg/dL (ref 70–99)

## 2024-01-30 SURGERY — COLONOSCOPY
Anesthesia: General

## 2024-01-30 MED ORDER — PROPOFOL 1000 MG/100ML IV EMUL
INTRAVENOUS | Status: AC
  Filled 2024-01-30: qty 100

## 2024-01-30 MED ORDER — PROPOFOL 500 MG/50ML IV EMUL
INTRAVENOUS | Status: DC | PRN
  Administered 2024-01-30: 100 ug/kg/min via INTRAVENOUS

## 2024-01-30 MED ORDER — SODIUM CHLORIDE 0.9 % IV SOLN: INTRAVENOUS | Status: DC

## 2024-01-30 MED ORDER — PROPOFOL 10 MG/ML IV BOLUS
INTRAVENOUS | Status: DC | PRN
Start: 2024-01-30 — End: 2024-01-30
  Administered 2024-01-30 (×2): 50 mg via INTRAVENOUS

## 2024-01-30 MED ORDER — LIDOCAINE HCL (CARDIAC) PF 100 MG/5ML IV SOSY
PREFILLED_SYRINGE | INTRAVENOUS | Status: DC | PRN
  Administered 2024-01-30: 80 mg via INTRATRACHEAL

## 2024-01-30 MED ORDER — EPHEDRINE SULFATE-NACL 50-0.9 MG/10ML-% IV SOSY
PREFILLED_SYRINGE | INTRAVENOUS | Status: DC | PRN
  Administered 2024-01-30: 5 mg via INTRAVENOUS

## 2024-01-30 MED ORDER — GLYCOPYRROLATE 0.2 MG/ML IJ SOLN
INTRAMUSCULAR | Status: DC | PRN
Start: 2024-01-30 — End: 2024-01-30
  Administered 2024-01-30: .2 mg via INTRAVENOUS

## 2024-01-30 NOTE — Transfer of Care (Signed)
 Immediate Anesthesia Transfer of Care Note  Patient: [name]  Procedure(s) Performed: COLONOSCOPY EGD (ESOPHAGOGASTRODUODENOSCOPY) POLYPECTOMY, INTESTINE  Patient Location: PACU and Endoscopy Unit  Anesthesia Type:General  Level of Consciousness: drowsy and patient cooperative  Airway & Oxygen Therapy: Patient Spontanous Breathing  Post-op Assessment: Report given to RN and Post -op Vital signs reviewed and stable  Post vital signs: Reviewed and stable  Last Vitals:  Vitals Value Taken Time  BP 130/45 01/30/24 08:44  Temp 36.2 C 01/30/24 08:43  Pulse 50 01/30/24 08:45  Resp 16 01/30/24 08:45  SpO2 100 % 01/30/24 08:45  Vitals shown include unfiled device data.  Last Pain:  Vitals:   01/30/24 0843  TempSrc: Temporal  PainSc: Asleep         Complications: No notable events documented.

## 2024-01-30 NOTE — Progress Notes (Signed)
 Noted.  Jacklin Mascot, MD

## 2024-01-30 NOTE — Op Note (Signed)
 [facility] Gastroenterology Patient Name: [name] Procedure Date: 01/30/2024 8:12 AM MRN: [id number] Account #: [id number] Date of Birth: 10/10/1955 Admit Type: Outpatient Age: 68 Room: [facility] ENDO ROOM 1 Gender: Male Note Status: Supervisor Override Instrument Name: Upper Endoscope [id number] Procedure:             Upper GI endoscopy Indications:           Dyspepsia, Gastro-esophageal reflux disease Providers:             Ruel Kung MD, MD Referring MD:          No Local Md, MD (Referring MD) Medicines:             Monitored Anesthesia Care Complications:         No immediate complications. Procedure:             Pre-Anesthesia Assessment:                        - Prior to the procedure, a History and Physical was                         performed, and patient medications, allergies and                         sensitivities were reviewed. The patient's tolerance                         of previous anesthesia was reviewed.                        - The risks and benefits of the procedure and the                         sedation options and risks were discussed with the                         patient. All questions were answered and informed                         consent was obtained.                        - ASA Grade Assessment: II - A patient with mild                         systemic disease.                        After obtaining informed consent, the endoscope was                         passed under direct vision. Throughout the procedure,                         the patient's blood pressure, pulse, and oxygen                         saturations were monitored continuously. The Endoscope  was introduced through the mouth, and advanced to the                         third part of duodenum. The upper GI endoscopy was                         accomplished with ease. The patient tolerated the                         procedure  well. Findings:      The esophagus was normal.      The examined duodenum was normal.      The entire examined stomach was normal. Biopsies were taken with a cold       forceps for histology.      The cardia and gastric fundus were normal on retroflexion. Impression:            - Normal esophagus.                        - Normal examined duodenum.                        - Normal stomach. Biopsied. Recommendation:        - Await pathology results.                        - Perform a colonoscopy today. Procedure Code(s):     --- Professional ---                        [id number], Esophagogastroduodenoscopy, flexible,                         transoral; with biopsy, single or multiple Diagnosis Code(s):     --- Professional ---                        R10.13, Epigastric pain CPT copyright 2022 American Medical Association. All rights reserved. The codes documented in this report are preliminary and upon coder review may  be revised to meet current compliance requirements. Ruel Kung, MD Ruel Kung MD, MD 01/30/2024 8:21:55 AM This report has been signed electronically. Number of Addenda: 0 Note Initiated On: 01/30/2024 8:12 AM Estimated Blood Loss:  Estimated blood loss: none.      [facility]

## 2024-01-30 NOTE — Anesthesia Postprocedure Evaluation (Signed)
 Anesthesia Post Note  Patient: AKSHAY SPANG  Procedure(s) Performed: COLONOSCOPY EGD (ESOPHAGOGASTRODUODENOSCOPY) POLYPECTOMY, INTESTINE  Patient location during evaluation: PACU Anesthesia Type: General Level of consciousness: awake and alert, oriented and patient cooperative Pain management: pain level controlled Vital Signs Assessment: post-procedure vital signs reviewed and stable Respiratory status: spontaneous breathing, nonlabored ventilation and respiratory function stable Cardiovascular status: blood pressure returned to baseline and stable Postop Assessment: adequate PO intake Anesthetic complications: no   No notable events documented.   Last Vitals:  Vitals:   01/30/24 0843 01/30/24 0853  BP: (!) 130/45 (!) 130/55  Pulse: (!) 51 (!) 52  Resp: 13 (!) 23  Temp: (!) 36.2 C   SpO2: 99% 100%    Last Pain:  Vitals:   01/30/24 0853  TempSrc:   PainSc: 0-No pain                 Alfonso Ruths

## 2024-01-30 NOTE — H&P (Signed)
 Ruel Kung , MD [address], [address], [place], [place], [address] Phone: [phone] Fax: [phone]  Primary Care Physician:  Abbey Bruckner, MD   Pre-Procedure History & Physical: HPI:  [name] is a 68 y.o. male is here for an endoscopy and colonoscopy    Past Medical History:  Diagnosis Date   Acute exacerbation of CHF (congestive heart failure) (HCC) 12/16/2023   Aortic atherosclerosis (HCC)    Arthritis    BPH with LUTS s/p UroLift with Dr. Penne in November 2024 on finasteride , Flomax , and Sanctura   and sees [facility]    BPH with LUTS, s/p urolift with Dr. Penne 06/2023, on Finasteride , Flomax , Sancture, sees    Carotid arterial disease (HCC)    a.) s/p LEFT CEA on 09/06/2019   Chest pain 09/20/2017   Coronary artery disease    a.) MI with stents x 2 in 2004; b.) MI with stent x 1 2005; c.) s/p CABG x 2 06/02/2006 (LIMA-LAD, SVG-PDA); d.) LHC/PCI 08/25/2007: 80% oD1 (2.5 x 12 mm Xience V DES)   DDD (degenerative disc disease), lumbar    Dyspnea    ESRD (end stage renal disease) (HCC)    a.) s/p RIGHT renal 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/2015   GERD (gastroesophageal reflux disease)    History of blood transfusion    Hypertension    Long term current use of aspirin     Long term current use of immunosuppressive drug    a.) mycophenolate  + tacrolimus    Myocardial infarction [facility]) 2004   a.) details unclear; stents x 2 (unknown type/location)   Myocardial infarction (HCC) 2005   a.) details unclear; stent x 1 (unknown type/location)   On apixaban  therapy    PAF (paroxysmal atrial fibrillation) (HCC)    a.) CHA2DS2-VASc = 5 (age, HTN, vascular disease history/MI, T2DM) as of 06/10/2023; b.) cardiac rate/rhythm maintained intrinsically without pharmacological intervention; chronically anticoagulated using apixaban    Sepsis due to gram-negative UTI (HCC) 04/27/2020   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   From DM and HTN, f/u with [facility] every 6 months    Subconjunctival hemorrhage of right eye 12/15/2023   T2DM (type 2 diabetes mellitus) (HCC)    Unstable angina (HCC)    Vitamin D deficiency     Past Surgical History:  Procedure Laterality Date   CATARACT EXTRACTION Bilateral    CATARACT EXTRACTION W/PHACO Right 02/03/2017   Procedure: CATARACT EXTRACTION PHACO AND INTRAOCULAR LENS PLACEMENT (IOC);  Surgeon: Mittie Gaskin, MD;  Location: [facility] ORS;  Service: Ophthalmology;  Laterality: Right;  US  00:35.8 AP% 12.8 CDE 4.57 Fluid lot # [id number]   COLONOSCOPY WITH PROPOFOL  N/A 12/30/2021   Procedure: COLONOSCOPY WITH PROPOFOL ;  Surgeon: Kung Ruel, MD;  Location: [facility] ENDOSCOPY;  Service: Gastroenterology;  Laterality: N/A;   CORONARY ANGIOPLASTY WITH STENT PLACEMENT Left 2004   stents x 2   CORONARY ANGIOPLASTY WITH STENT PLACEMENT Left 2005   stent x 1   CORONARY ANGIOPLASTY WITH STENT PLACEMENT Left 08/25/2007   Procedure: CORONARY ANGIOPLASTY WITH STENT PLACEMENT; Location: [facility]; Surgeon: Valinda Cage, MD   CORONARY ARTERY BYPASS GRAFT N/A 06/08/2006   Procedure: CORONARY ARTERY BYPASS GRAFT; Location: [facility]; Surgeon: Maude Sharps, MD   CYSTOSCOPY WITH INSERTION OF UROLIFT N/A 06/13/2023   Procedure: CYSTOSCOPY WITH INSERTION OF UROLIFT;  Surgeon: Penne Knee, MD;  Location: [facility] ORS;  Service: Urology;  Laterality: N/A;   ENDARTERECTOMY Left 09/06/2019   Procedure: ENDARTERECTOMY CAROTID;  Surgeon: Marea Selinda RAMAN, MD;  Location: [facility] ORS;  Service: Vascular;  Laterality: Left;   HIP FRACTURE SURGERY     KIDNEY TRANSPLANT Right 05/10/2015   LEFT HEART CATH AND CORONARY ANGIOGRAPHY Left 05/20/2006   Procedure: LEFT HEART CATH AND CORONARY ANGIOGRAPHY; Location: [facility]; Surgeon: Denyse Bathe, MD   LEFT HEART CATH AND CORONARY ANGIOGRAPHY Left 11/08/2023   Procedure: LEFT HEART CATH AND CORONARY ANGIOGRAPHY with possible intervention;  Surgeon: Bathe Denyse LABOR, MD;  Location: [facility] INVASIVE CV LAB;  Service: Cardiovascular;   Laterality: Left;   LEFT HEART CATH AND CORS/GRAFTS ANGIOGRAPHY Left 08/10/2007   Procedure: LEFT HEART CATH AND CORS/GRAFTS ANGIOGRAPHY; Location: [facility]; Surgeon: Denyse Bathe, MD   LEFT HEART CATH AND CORS/GRAFTS ANGIOGRAPHY Left 05/04/2011   Procedure: LEFT HEART CATH AND CORS/GRAFTS ANGIOGRAPHY; Location: [facility]; Surgeon: Denyse Bathe, MD    Prior to Admission medications   Medication Sig Start Date End Date Taking? Authorizing Provider  amiodarone  (PACERONE ) 200 MG tablet Take 1 tablet (200 mg total) by mouth daily. 01/05/24  Yes Bathe Denyse LABOR, MD  ascorbic acid (VITAMIN C) 500 MG tablet Take 500 mg by mouth daily.   Yes [provider]  aspirin  EC 81 MG EC tablet Take 1 tablet (81 mg total) by mouth daily at 6 (six) AM. 09/08/19  Yes Stegmayer, Suzen LABOR, PA-C  atorvastatin  (LIPITOR) 40 MG tablet Take 40 mg by mouth at bedtime. 12/23/14  Yes [provider]  CELLCEPT  250 MG capsule Take 500 mg by mouth 2 (two) times daily. 08/12/21  Yes [provider]  Cholecalciferol  (VITAMIN D) 50 MCG (2000 UT) CAPS Take 2,000 Units by mouth daily.   Yes [provider]  ELIQUIS  5 MG TABS tablet Take 5 mg by mouth 2 (two) times daily.  04/21/18  Yes [provider]  finasteride  (PROSCAR ) 5 MG tablet Take 1 tablet (5 mg total) by mouth daily. 01/24/24  Yes McGowan, Shannon A, PA-C  HUMALOG KWIKPEN 100 UNIT/ML KiwkPen Inject 10-12 Units into the skin 3 (three) times daily. Inject 10 units daily at breakfast, 10 units at lunch and up to 10 units or less at supper per SSI   Yes [provider]  hydrALAZINE  (APRESOLINE ) 25 MG tablet Take 1 tablet (25 mg total) by mouth 2 (two) times daily. 01/23/24 01/22/25 Yes Bathe Denyse LABOR, MD  insulin  glargine (LANTUS  SOLOSTAR) 100 UNIT/ML Solostar Pen Inject 30 Units into the skin at bedtime. 12/22/23  Yes [provider]  linagliptin (TRADJENTA) 5 MG TABS tablet Take 5 mg by mouth daily. 11/23/19  Yes [provider]  losartan  (COZAAR ) 100 MG tablet Take 1 tablet (100 mg total) by mouth daily. 11/11/23 11/10/24 Yes Bathe Denyse LABOR, MD  omeprazole (PRILOSEC) 20 MG capsule Take 20 mg by mouth daily.   Yes [provider]  tacrolimus  (PROGRAF ) 0.5 MG capsule Take 0.5 mg by mouth at bedtime. 09/08/23  Yes [provider]  tamsulosin  (FLOMAX ) 0.4 MG CAPS capsule Take 2 capsules (0.8 mg total) by mouth daily. 01/24/24  Yes McGowan, Clotilda A, PA-C  trospium  (SANCTURA ) 20 MG tablet Take 1 tablet (20 mg total) by mouth at bedtime. 01/24/24  Yes McGowan, Clotilda A, PA-C  BOOSTRIX 5-2.5-18.5 LF-MCG/0.5 injection Inject 0.5 mLs into the muscle once. 11/25/23   [provider]  Continuous Blood Gluc Sensor (FREESTYLE LIBRE 2 SENSOR) MISC by Does not apply route.    [provider]  Continuous Glucose Sensor (FREESTYLE LIBRE 2 PLUS SENSOR) MISC 1 each by Other route every 14 (fourteen) days. 12/22/23  [provider]  furosemide  (LASIX ) 20 MG tablet Take 1 tablet (20 mg total) by mouth daily. 12/28/23 01/27/24  Bair, Kalpana, MD  Insulin  Pen Needle 33G X 4 MM MISC To use with insulin  pen 4 times per day. E 13.9 02/19/16   [provider]  LANTUS  SOLOSTAR 100 UNIT/ML Solostar Pen Inject 24 Units into the skin at bedtime.  01/18/15   [provider]  nitroGLYCERIN  (NITROSTAT ) 0.4 MG SL tablet Place 1 tablet (0.4 mg total) under the tongue every 5 (five) minutes as needed for chest pain. 11/11/23 11/10/24  Fernand Denyse LABOR, MD  PROGRAF  1 MG capsule Take 1 mg by mouth every morning. 08/11/16   [provider]  [facility] injection Inject 0.5 mLs into the muscle once. 11/25/23   [provider]  sodium chloride  (OCEAN) 0.65 % nasal spray Place 1 spray into the nose as needed (dry nose related to cpap use). 12/21/23   Abbey Bruckner, MD    Allergies as of 01/18/2024 - Review Complete 12/29/2023  Allergen Reaction Noted   Oxybutynin  Other (See Comments)  05/29/2021    Family History  Problem Relation Age of Onset   Osteoporosis Mother    Drug abuse Sister    Drug abuse Brother     Social History   Socioeconomic History   Marital status: Married    Spouse name: Not on file   Number of children: Not on file   Years of education: Not on file   Highest education level: 5th grade  Occupational History   Not on file  Tobacco Use   Smoking status: Former   Smokeless tobacco: Never   Tobacco comments:    30 years ago  Vaping Use   Vaping status: Never Used  Substance and Sexual Activity   Alcohol use: No    Alcohol/week: 0.0 standard drinks of alcohol   Drug use: Never   Sexual activity: Not on file  Other Topics Concern   Not on file  Social History Narrative   Not on file   Social Drivers of Health   Financial Resource Strain: Low Risk  (01/18/2024)   Received from [facility]   Overall Financial Resource Strain (CARDIA)    Difficulty of Paying Living Expenses: Not hard at all  Food Insecurity: No Food Insecurity (01/18/2024)   Received [facility]   Hunger Vital Sign    Within the past 12 months, you worried that your food would run out before you got the money to buy more.: Never true    Within the past 12 months, the food you bought just didn't last and you didn't have money to get more.: Never true  Transportation Needs: No Transportation Needs (01/18/2024)   Received from [facility] - Transportation    In the past 12 months, has lack of transportation kept you from medical appointments or from getting medications?: No    Lack of Transportation (Non-Medical): No  Physical Activity: Sufficiently Active (11/30/2023)   Exercise Vital Sign    Days of Exercise per Week: 4 days    Minutes of Exercise per Session: 60 min  Stress: No Stress Concern Present (11/30/2023)   [employer] of Occupational Health - Occupational Stress Questionnaire     Feeling of Stress : Not at all  Social Connections: Moderately Integrated (12/17/2023)   Social Connection and Isolation Panel    Frequency of Communication with Friends and Family: More than three  times a week    Frequency of Social Gatherings with Friends and Family: More than three times a week    Attends Religious Services: More than 4 times per year    Active Member of [employer] or Organizations: No    Attends Banker Meetings: Never    Marital Status: Married  Catering manager Violence: Not At Risk (12/29/2023)   Humiliation, Afraid, Rape, and Kick questionnaire    Fear of Current or Ex-Partner: No    Emotionally Abused: No    Physically Abused: No    Sexually Abused: No    Review of Systems: See HPI, otherwise negative ROS  Physical Exam: BP (!) 157/56   Pulse (!) 48   Temp 97.8 F (36.6 C) (Temporal)   Resp 15   Ht 5' 8 (1.727 m)   Wt 75.2 kg   SpO2 97%   BMI 25.21 kg/m  General:   Alert,  pleasant and cooperative in NAD Head:  Normocephalic and atraumatic. Neck:  Supple; no masses or thyromegaly. Lungs:  Clear throughout to auscultation, normal respiratory effort.    Heart:  +S1, +S2, Regular rate and rhythm, No edema. Abdomen:  Soft, nontender and nondistended. Normal bowel sounds, without guarding, and without rebound.   Neurologic:  Alert and  oriented x4;  grossly normal neurologically.  Impression/Plan: [name] is here for an endoscopy and colonoscopy  to be performed for  evaluation of dyspepsia and colon cancer screening     Risks, benefits, limitations, and alternatives regarding endoscopy have been reviewed with the patient.  Questions have been answered.  All parties agreeable.   Ruel Kung, MD  01/30/2024, 8:09 AM

## 2024-01-30 NOTE — Op Note (Signed)
 [facility] Patient Name: [name] Procedure Date: 01/30/2024 8:11 AM MRN: [id number] Account #: [id number] Date of Birth: 06-11-1956 Admit Type: Outpatient Age: 68 Room: [facility] ENDO ROOM 1 Gender: Male Note Status: Finalized Instrument Name: Veta [id number] Procedure:             Colonoscopy Indications:           Screening for colorectal malignant neoplasm Providers:             Ruel Kung MD, MD Referring MD:          No Local Md, MD (Referring MD) Medicines:             Monitored Anesthesia Care Complications:         No immediate complications. Procedure:             Pre-Anesthesia Assessment:                        - Prior to the procedure, a History and Physical was                         performed, and patient medications, allergies and                         sensitivities were reviewed. The patient's tolerance                         of previous anesthesia was reviewed.                        - The risks and benefits of the procedure and the                         sedation options and risks were discussed with the                         patient. All questions were answered and informed                         consent was obtained.                        - ASA Grade Assessment: II - A patient with mild                         systemic disease.                        After obtaining informed consent, the colonoscope was                         passed under direct vision. Throughout the procedure,                         the patient's blood pressure, pulse, and oxygen                         saturations were monitored continuously. The                         Colonoscope was  introduced through the anus and                         advanced to the the cecum, identified by the                         appendiceal orifice. The colonoscopy was performed                         with ease. The patient tolerated the procedure well.                          The quality of the bowel preparation was excellent.                         The ileocecal valve, appendiceal orifice, and rectum                         were photographed. Findings:      The perianal and digital rectal examinations were normal.      A 5 mm polyp was found in the ascending colon. The polyp was sessile.       The polyp was removed with a cold snare. Resection and retrieval were       complete.      The exam was otherwise without abnormality on direct and retroflexion       views. Impression:            - One 5 mm polyp in the ascending colon, removed with                         a cold snare. Resected and retrieved.                        - The examination was otherwise normal on direct and                         retroflexion views. Recommendation:        - Discharge patient to home (with escort).                        - Resume previous diet.                        - Continue present medications.                        - Await pathology results.                        - Repeat colonoscopy in 7-10 years for surveillance                         based on pathology results. Procedure Code(s):     --- Professional ---                        [phone], Colonoscopy, flexible; with removal of                         tumor(s), polyp(s), or  other lesion(s) by snare                         technique Diagnosis Code(s):     --- Professional ---                        Z12.11, Encounter for screening for malignant neoplasm                         of colon                        D12.2, Benign neoplasm of ascending colon CPT copyright 2022 American Medical Association. All rights reserved. The codes documented in this report are preliminary and upon coder review may  be revised to meet current compliance requirements. Ruel Kung, MD Ruel Kung MD, MD 01/30/2024 8:41:22 AM This report has been signed electronically. Number of Addenda: 0 Note Initiated On: 01/30/2024  8:11 AM Scope Withdrawal Time: 0 hours 12 minutes 13 seconds  Total Procedure Duration: 0 hours 15 minutes 42 seconds  Estimated Blood Loss:  Estimated blood loss: none.      [facility]

## 2024-01-31 LAB — SURGICAL PATHOLOGY

## 2024-02-02 DIAGNOSIS — E119 Type 2 diabetes mellitus without complications: Secondary | ICD-10-CM | POA: Diagnosis not present

## 2024-02-18 DIAGNOSIS — Z940 Kidney transplant status: Principal | ICD-10-CM

## 2024-02-18 MED ORDER — TACROLIMUS 1 MG CAPSULE, IMMEDIATE-RELEASE
ORAL_CAPSULE | ORAL | 2 refills | 0.00000 days
Start: 2024-02-18 — End: ?

## 2024-02-20 MED ORDER — TACROLIMUS 1 MG CAPSULE, IMMEDIATE-RELEASE
ORAL_CAPSULE | ORAL | 2 refills | 0.00000 days | Status: CP
Start: 2024-02-20 — End: ?

## 2024-02-21 ENCOUNTER — Ambulatory Visit (INDEPENDENT_AMBULATORY_CARE_PROVIDER_SITE_OTHER): Admitting: *Deleted

## 2024-02-21 ENCOUNTER — Ambulatory Visit (INDEPENDENT_AMBULATORY_CARE_PROVIDER_SITE_OTHER)

## 2024-02-21 VITALS — BP 120/70 | HR 51 | Temp 97.7°F | Ht 68.0 in | Wt 168.8 lb

## 2024-02-21 VITALS — Ht 68.0 in | Wt 168.8 lb

## 2024-02-21 DIAGNOSIS — I25708 Atherosclerosis of coronary artery bypass graft(s), unspecified, with other forms of angina pectoris: Secondary | ICD-10-CM | POA: Diagnosis not present

## 2024-02-21 DIAGNOSIS — E559 Vitamin D deficiency, unspecified: Secondary | ICD-10-CM

## 2024-02-21 DIAGNOSIS — I5022 Chronic systolic (congestive) heart failure: Secondary | ICD-10-CM | POA: Diagnosis not present

## 2024-02-21 DIAGNOSIS — E1142 Type 2 diabetes mellitus with diabetic polyneuropathy: Secondary | ICD-10-CM | POA: Diagnosis not present

## 2024-02-21 DIAGNOSIS — K219 Gastro-esophageal reflux disease without esophagitis: Secondary | ICD-10-CM | POA: Diagnosis not present

## 2024-02-21 DIAGNOSIS — N1831 Chronic kidney disease, stage 3a: Secondary | ICD-10-CM

## 2024-02-21 DIAGNOSIS — I6523 Occlusion and stenosis of bilateral carotid arteries: Secondary | ICD-10-CM

## 2024-02-21 DIAGNOSIS — D849 Immunodeficiency, unspecified: Secondary | ICD-10-CM | POA: Diagnosis not present

## 2024-02-21 DIAGNOSIS — Z794 Long term (current) use of insulin: Secondary | ICD-10-CM | POA: Diagnosis not present

## 2024-02-21 DIAGNOSIS — N400 Enlarged prostate without lower urinary tract symptoms: Secondary | ICD-10-CM

## 2024-02-21 DIAGNOSIS — E119 Type 2 diabetes mellitus without complications: Secondary | ICD-10-CM

## 2024-02-21 DIAGNOSIS — Z0000 Encounter for general adult medical examination without abnormal findings: Secondary | ICD-10-CM

## 2024-02-21 DIAGNOSIS — Z48298 Encounter for aftercare following other organ transplant: Secondary | ICD-10-CM

## 2024-02-21 DIAGNOSIS — I10 Essential (primary) hypertension: Secondary | ICD-10-CM

## 2024-02-21 DIAGNOSIS — E1122 Type 2 diabetes mellitus with diabetic chronic kidney disease: Secondary | ICD-10-CM

## 2024-02-21 DIAGNOSIS — M1990 Unspecified osteoarthritis, unspecified site: Secondary | ICD-10-CM | POA: Insufficient documentation

## 2024-02-21 DIAGNOSIS — I700 Atherosclerosis of aorta: Secondary | ICD-10-CM | POA: Insufficient documentation

## 2024-02-21 MED ORDER — LINAGLIPTIN 5 MG PO TABS: 5.0000 mg | ORAL_TABLET | Freq: Every day | ORAL | 1 refills | Status: AC

## 2024-02-21 NOTE — Assessment & Plan Note (Addendum)
 Chronic, stable. His diabetes is managed by endocrinologist Therisa Leep MD, [facility] clinic. His last A1c was 6.6% in June. He is on Tradjenta , Humalog and Lantus . He is requesting refill on Trajenta 5 mg daily as his appointment with endocrinologist is not till 06/2024. Refill sent. I also reviewed his CGM BG reading (165 today).

## 2024-02-21 NOTE — Assessment & Plan Note (Signed)
 BBP on arrival was elevated. Repeat BP within normal range. Continue Hydralazine  25 mg BID, Losartan  100 mg daily per cardiology.

## 2024-02-21 NOTE — Assessment & Plan Note (Signed)
 Reduced sensation on both foot as indicated by the monofilament test, consistent with diabetic neuropathy. Advise patient to perform daily foot inspections for new lesions, cuts, changes in skin condition, appropriate footwear. Recommend f/u with us  or endocrinologist if new symptoms such as numbness, tinging or foot pain.

## 2024-02-21 NOTE — Assessment & Plan Note (Signed)
 Continue f/u with Kidney transplant team: Kotzen, Elizabeth S, MD/[facility]

## 2024-02-21 NOTE — Assessment & Plan Note (Signed)
 On chronic immunosuppressive therapy with Tacrolimus  and Mycophenolate  s/p right kidney transplant in 05/10/2015. At increased risk of infection. Continue f/u with transplant clinic with [facility].

## 2024-02-21 NOTE — Assessment & Plan Note (Signed)
 Underwent EGD on 01/30/24 without esophagitis, symptoms stable on Omeprazole 20 mg daily, continue.

## 2024-02-21 NOTE — Assessment & Plan Note (Signed)
 Most recent vitamin D has been normal.

## 2024-02-21 NOTE — Progress Notes (Signed)
 Subjective:   [name] is a 68 y.o. who presents for a Medicare Wellness preventive visit.  As a reminder, Annual Wellness Visits don't include a physical exam, and some assessments may be limited, especially if this visit is performed virtually. We may recommend an in-person follow-up visit with your provider if needed.  Visit Complete: Virtual I connected with  [name] on 02/21/24 by a audio enabled telemedicine application and verified that I am speaking with the correct person using two identifiers.  Patient Location: Home  Provider Location: Home Office  I discussed the limitations of evaluation and management by telemedicine. The patient expressed understanding and agreed to proceed.  Vital Signs: Because this visit was a virtual/telehealth visit, some criteria may be missing or patient reported. Any vitals not documented were not able to be obtained and vitals that have been documented are patient reported.  VideoDeclined- This patient declined Librarian, academic. Therefore the visit was completed with audio only.  Persons Participating in Visit: Patient.  AWV Questionnaire: Yes: Patient Medicare AWV questionnaire was completed by the patient on 02/20/24; I have confirmed that all information answered by patient is correct and no changes since this date.  Cardiac Risk Factors include: advanced age (>65men, >19 women);diabetes mellitus;dyslipidemia;hypertension     Objective:    Today's Vitals   02/21/24 1054  Weight: 168 lb 12.8 oz (76.6 kg)  Height: 5' 8 (1.727 m)   Body mass index is 25.67 kg/m.     02/21/2024   11:05 AM 01/30/2024    7:24 AM 12/17/2023    9:15 PM 12/16/2023   10:05 AM 06/13/2023    7:25 AM 06/06/2023    1:56 PM 12/30/2021    6:49 AM  Advanced Directives  Does Patient Have a Medical Advance Directive? Yes Yes  No Yes Yes Yes  Type of Estate agent of [place];Living will Living  will   Living will Healthcare Power of [place];Living will Living will  Does patient want to make changes to medical advance directive?      No - Patient declined   Copy of Healthcare Power of Attorney in Chart? No - copy requested    No - copy requested No - copy requested   Would patient like information on creating a medical advance directive?   No - Patient declined        Current Medications (verified) Outpatient Encounter Medications as of 02/21/2024  Medication Sig   amiodarone  (PACERONE ) 200 MG tablet Take 1 tablet (200 mg total) by mouth daily.   ascorbic acid (VITAMIN C) 500 MG tablet Take 500 mg by mouth daily.   aspirin  EC 81 MG EC tablet Take 1 tablet (81 mg total) by mouth daily at 6 (six) AM.   atorvastatin  (LIPITOR) 40 MG tablet Take 40 mg by mouth at bedtime.   BOOSTRIX 5-2.5-18.5 LF-MCG/0.5 injection Inject 0.5 mLs into the muscle once.   CELLCEPT  250 MG capsule Take 500 mg by mouth 2 (two) times daily.   Cholecalciferol  (VITAMIN D) 50 MCG (2000 UT) CAPS Take 2,000 Units by mouth daily.   Continuous Blood Gluc Sensor (FREESTYLE LIBRE 2 SENSOR) MISC by Does not apply route.   Continuous Glucose Sensor (FREESTYLE LIBRE 2 PLUS SENSOR) MISC 1 each by Other route every 14 (fourteen) days.   ELIQUIS  5 MG TABS tablet Take 5 mg by mouth 2 (two) times daily.    finasteride  (PROSCAR ) 5 MG tablet Take 1 tablet (5 mg total)  by mouth daily.   furosemide  (LASIX ) 20 MG tablet Take 1 tablet (20 mg total) by mouth daily.   HUMALOG KWIKPEN 100 UNIT/ML KiwkPen Inject 10-12 Units into the skin 3 (three) times daily. Inject 10 units daily at breakfast, 10 units at lunch and up to 10 units or less at supper per SSI   hydrALAZINE  (APRESOLINE ) 25 MG tablet Take 1 tablet (25 mg total) by mouth 2 (two) times daily.   insulin  glargine (LANTUS  SOLOSTAR) 100 UNIT/ML Solostar Pen Inject 30 Units into the skin at bedtime.   Insulin  Pen Needle 33G X 4 MM MISC To use with insulin  pen 4 times per day. E 13.9    linagliptin  (TRADJENTA ) 5 MG TABS tablet Take 1 tablet (5 mg total) by mouth daily.   losartan  (COZAAR ) 100 MG tablet Take 1 tablet (100 mg total) by mouth daily.   nitroGLYCERIN  (NITROSTAT ) 0.4 MG SL tablet Place 1 tablet (0.4 mg total) under the tongue every 5 (five) minutes as needed for chest pain.   omeprazole (PRILOSEC) 20 MG capsule Take 20 mg by mouth daily.   PROGRAF  1 MG capsule Take 1 mg by mouth every morning.   SHINGRIX injection Inject 0.5 mLs into the muscle once.   tacrolimus  (PROGRAF ) 0.5 MG capsule Take 0.5 mg by mouth at bedtime.   tamsulosin  (FLOMAX ) 0.4 MG CAPS capsule Take 2 capsules (0.8 mg total) by mouth daily.   trospium  (SANCTURA ) 20 MG tablet Take 1 tablet (20 mg total) by mouth at bedtime.   Facility-Administered Encounter Medications as of 02/21/2024  Medication   sodium chloride  flush (NS) 0.9 % injection 3 mL    Allergies (verified) Oxybutynin  and Trospium  chloride   History: Past Medical History:  Diagnosis Date   Acute exacerbation of CHF (congestive heart failure) (HCC) 12/16/2023   Acute on chronic congestive heart failure (HCC) 11/03/2023   Adenomatous polyp of colon 01/30/2024   AKI (acute kidney injury) (HCC) 12/16/2023   Aortic atherosclerosis (HCC)    Arthritis    BPH with LUTS s/p UroLift with Dr. Penne in November 2024 on finasteride , Flomax , and Sanctura   and sees [facility]    BPH with LUTS, s/p urolift with Dr. Penne 06/2023, on Finasteride , Flomax , Sancture, sees    Carotid arterial disease (HCC)    a.) s/p LEFT CEA on 09/06/2019   Chest pain 09/20/2017   Colon cancer screening 01/30/2024   Coronary artery disease    a.) MI with stents x 2 in 2004; b.) MI with stent x 1 2005; c.) s/p CABG x 2 06/02/2006 (LIMA-LAD, SVG-PDA); d.) LHC/PCI 08/25/2007: 80% oD1 (2.5 x 12 mm Xience V DES)   DDD (degenerative disc disease), lumbar    Dyspepsia 01/30/2024   Dyspnea    ESRD (end stage renal disease) (HCC)    a.) s/p RIGHT renal  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/2015   GERD (gastroesophageal reflux disease)    History of blood transfusion    Hospitalization within last 30 days 06/03/2020   Hypertension    Long term current use of aspirin     Long term current use of immunosuppressive drug    a.) mycophenolate  + tacrolimus    Myocardial infarction [facility]) 2004   a.) details unclear; stents x 2 (unknown type/location)   Myocardial infarction (HCC) 2005   a.) details unclear; stent x 1 (unknown type/location)   On apixaban  therapy    Orthopnea 12/16/2023   PAF (paroxysmal atrial fibrillation) (HCC)    a.) CHA2DS2-VASc = 5 (age, HTN, vascular disease  history/MI, T2DM) as of 06/10/2023; b.) cardiac rate/rhythm maintained intrinsically without pharmacological intervention; chronically anticoagulated using apixaban    Sepsis due to gram-negative UTI (HCC) 04/27/2020   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   From DM and HTN, f/u with [facility] every 6 months   Subconjunctival hemorrhage of right eye 12/15/2023   T2DM (type 2 diabetes mellitus) (HCC)    Unstable angina (HCC)    Vitamin D deficiency    Past Surgical History:  Procedure Laterality Date   CATARACT EXTRACTION Bilateral    CATARACT EXTRACTION W/PHACO Right 02/03/2017   Procedure: CATARACT EXTRACTION PHACO AND INTRAOCULAR LENS PLACEMENT (IOC);  Surgeon: Mittie Gaskin, MD;  Location: [facility] ORS;  Service: Ophthalmology;  Laterality: Right;  US  00:35.8 AP% 12.8 CDE 4.57 Fluid lot # [id number]   COLONOSCOPY N/A 01/30/2024   Procedure: COLONOSCOPY;  Surgeon: Therisa Bi, MD;  Location: [facility] ENDOSCOPY;  Service: Gastroenterology;  Laterality: N/A;  IDDM   COLONOSCOPY WITH PROPOFOL  N/A 12/30/2021   Procedure: COLONOSCOPY WITH PROPOFOL ;  Surgeon: Therisa Bi, MD;  Location: [facility] ENDOSCOPY;  Service: Gastroenterology;  Laterality: N/A;   CORONARY ANGIOPLASTY WITH STENT PLACEMENT Left 2004   stents x 2   CORONARY ANGIOPLASTY WITH STENT PLACEMENT Left 2005   stent x 1   CORONARY  ANGIOPLASTY WITH STENT PLACEMENT Left 08/25/2007   Procedure: CORONARY ANGIOPLASTY WITH STENT PLACEMENT; Location: [facility]; Surgeon: Valinda Cage, MD   CORONARY ARTERY BYPASS GRAFT N/A 06/08/2006   Procedure: CORONARY ARTERY BYPASS GRAFT; Location: [facility]; Surgeon: Maude Sharps, MD   CYSTOSCOPY WITH INSERTION OF UROLIFT N/A 06/13/2023   Procedure: CYSTOSCOPY WITH INSERTION OF UROLIFT;  Surgeon: Penne Knee, MD;  Location: [facility] ORS;  Service: Urology;  Laterality: N/A;   ENDARTERECTOMY Left 09/06/2019   Procedure: ENDARTERECTOMY CAROTID;  Surgeon: Marea Selinda RAMAN, MD;  Location: [facility] ORS;  Service: Vascular;  Laterality: Left;   ESOPHAGOGASTRODUODENOSCOPY N/A 01/30/2024   Procedure: EGD (ESOPHAGOGASTRODUODENOSCOPY);  Surgeon: Therisa Bi, MD;  Location[facility] ENDOSCOPY;  Service: Gastroenterology;  Laterality: N/A;   HIP FRACTURE SURGERY     KIDNEY TRANSPLANT Right 05/10/2015   LEFT HEART CATH AND CORONARY ANGIOGRAPHY Left 05/20/2006   Procedure: LEFT HEART CATH AND CORONARY ANGIOGRAPHY; Location: [facility]; Surgeon: Denyse Bathe, MD   LEFT HEART CATH AND CORONARY ANGIOGRAPHY Left 11/08/2023   Procedure: LEFT HEART CATH AND CORONARY ANGIOGRAPHY with possible intervention;  Surgeon: Bathe Denyse LABOR, MD;  Location: [facility] INVASIVE CV LAB;  Service: Cardiovascular;  Laterality: Left;   LEFT HEART CATH AND CORS/GRAFTS ANGIOGRAPHY Left 08/10/2007   Procedure: LEFT HEART CATH AND CORS/GRAFTS ANGIOGRAPHY; Location: [facility]; Surgeon: Denyse Bathe, MD   LEFT HEART CATH AND CORS/GRAFTS ANGIOGRAPHY Left 05/04/2011   Procedure: LEFT HEART CATH AND CORS/GRAFTS ANGIOGRAPHY; Location: [facility]; Surgeon: Denyse Bathe, MD   POLYPECTOMY  01/30/2024   Procedure: POLYPECTOMY, INTESTINE;  Surgeon: Therisa Bi, MD;  Location: [facility] ENDOSCOPY;  Service: Gastroenterology;;   Family History  Problem Relation Age of Onset   Osteoporosis Mother    Drug abuse Sister    Drug abuse Brother    Social History   Socioeconomic History    Marital status: Married    Spouse name: Not on file   Number of children: Not on file   Years of education: Not on file   Highest education level: 5th grade  Occupational History   Not on file  Tobacco Use   Smoking status: Former   Smokeless tobacco: Never   Tobacco comments:    30 years ago  Vaping Use  Vaping status: Never Used  Substance and Sexual Activity   Alcohol use: No    Alcohol/week: 0.0 standard drinks of alcohol   Drug use: Never   Sexual activity: Not on file  Other Topics Concern   Not on file  Social History Narrative   married   Social Drivers of Corporate investment banker Strain: Low Risk  (02/20/2024)   Overall Financial Resource Strain (CARDIA)    Difficulty of Paying Living Expenses: Not hard at all  Food Insecurity: No Food Insecurity (02/20/2024)   Hunger Vital Sign    Worried About Running Out of Food in the Last Year: Never true    Ran Out of Food in the Last Year: Never true  Transportation Needs: No Transportation Needs (02/20/2024)   PRAPARE - Administrator, Civil Service (Medical): No    Lack of Transportation (Non-Medical): No  Physical Activity: Sufficiently Active (02/20/2024)   Exercise Vital Sign    Days of Exercise per Week: 5 days    Minutes of Exercise per Session: 60 min  Stress: No Stress Concern Present (02/20/2024)   [employer] of Occupational Health - Occupational Stress Questionnaire    Feeling of Stress: Not at all  Social Connections: Moderately Integrated (02/20/2024)   Social Connection and Isolation Panel    Frequency of Communication with Friends and Family: More than three times a week    Frequency of Social Gatherings with Friends and Family: More than three times a week    Attends Religious Services: More than 4 times per year    Active Member of [employer] or Organizations: No    Attends Engineer, structural: Never    Marital Status: Married    Tobacco Counseling Counseling given: Not  Answered Tobacco comments: 30 years ago    Clinical Intake:  Pre-visit preparation completed: Yes  Pain : No/denies pain     BMI - recorded: 25.67 Nutritional Status: BMI 25 -29 Overweight Nutritional Risks: None Diabetes:  (Yes) CBG done?: Yes FBS 122 per patient  Lab Results  Component Value Date   HGBA1C 6.6 (H) 12/16/2023   HGBA1C 7 06/09/2023   HGBA1C 6.5 (H) 04/27/2020     How often do you need to have someone help you when you read instructions, pamphlets, or other written materials from your doctor or pharmacy?: 1 - Never  Interpreter Needed?: No  Information entered by :: R. Kaelani Kendrick LPN   Activities of Daily Living     02/20/2024   11:37 AM 12/17/2023    9:15 PM  In your present state of health, do you have any difficulty performing the following activities:  Hearing? 0 0  Vision? 0 0  Comment glasses   Difficulty concentrating or making decisions? 0 0  Walking or climbing stairs? 1   Dressing or bathing? 0   Doing errands, shopping? 0 0  Preparing Food and eating ? N   Using the Toilet? N   In the past six months, have you accidently leaked urine? N   Do you have problems with loss of bowel control? N   Managing your Medications? N   Managing your Finances? N   Housekeeping or managing your Housekeeping? N     Patient Care Team: Bair, Kalpana, MD as PCP - General (Family Medicine) Therisa Bi, MD as Consulting Physician (Gastroenterology) Solum, Therisa HERO, MD as Physician Assistant (Endocrinology) Helon Clotilda DELENA DEVONNA as Physician Assistant (Urology) Fernand Denyse DELENA, MD as Consulting Physician (Cardiology)  I have updated your Care Teams any recent Medical Services you may have received from other providers in the past year.     Assessment:   This is a routine wellness examination for [facility].  Hearing/Vision screen Hearing Screening - Comments:: No issues Vision Screening - Comments:: glasses   Goals Addressed             This  Visit's Progress    Patient Stated       Wants to continue to exercise       Depression Screen     02/21/2024   11:01 AM 11/14/2023    9:10 AM  PHQ 2/9 Scores  PHQ - 2 Score 0 0  PHQ- 9 Score 0 0    Fall Risk     02/20/2024   11:37 AM 12/29/2023   12:30 PM 12/22/2023    3:07 PM 11/14/2023    9:09 AM  Fall Risk   Falls in the past year? 0 0 0 0  Number falls in past yr: 0 0 0 0  Injury with Fall? 0 0 0 0  Risk for fall due to : No Fall Risks No Fall Risks No Fall Risks Impaired balance/gait;History of fall(s)  Follow up Falls evaluation completed;Falls prevention discussed  Falls evaluation completed Falls evaluation completed    MEDICARE RISK AT HOME:  Medicare Risk at Home Any stairs in or around the home?: (Patient-Rptd) Yes If so, are there any without handrails?: (Patient-Rptd) No Home free of loose throw rugs in walkways, pet beds, electrical cords, etc?: (Patient-Rptd) Yes Adequate lighting in your home to reduce risk of falls?: (Patient-Rptd) Yes Life alert?: (Patient-Rptd) No Use of a cane, walker or w/c?: (Patient-Rptd) Yes Grab bars in the bathroom?: (Patient-Rptd) Yes Shower chair or bench in shower?: (Patient-Rptd) Yes Elevated toilet seat or a handicapped toilet?: (Patient-Rptd) No  TIMED UP AND GO:  Was the test performed?  No  Cognitive Function: 6CIT completed        02/21/2024   11:05 AM  6CIT Screen  What Year? 0 points  What month? 0 points  What time? 0 points  Count back from 20 0 points  Months in reverse 0 points  Repeat phrase 0 points  Total Score 0 points    Immunizations Immunization History  Administered Date(s) Administered   Fluzone Influenza virus vaccine,trivalent (IIV3), split virus 05/07/2008   Influenza, High Dose Seasonal PF 05/25/2023   Influenza,inj,quad, With Preservative 05/21/2016   Influenza-Unspecified 05/09/2015, 04/24/2016, 05/12/2017, 05/06/2022   Moderna Covid-19 Fall Seasonal Vaccine 47yrs & older  05/21/2022, 05/25/2023   Moderna Covid-19 Vaccine Bivalent Booster 75yrs & up 04/28/2021, 10/08/2021   Moderna Sars-Covid-2 Vaccination 08/23/2019, 09/25/2019, 05/30/2020   PNEUMOCOCCAL CONJUGATE-20 05/20/2022   PPD Test 04/22/2010   Pneumococcal Conjugate-13 05/21/2016   Pneumococcal Polysaccharide-23 05/07/2008, 09/30/2009   Tdap 08/21/2013, 11/25/2023   Zoster Recombinant(Shingrix) 02/15/2017, 11/25/2023   Zoster, Live 08/21/2013    Screening Tests Health Maintenance  Topic Date Due   COVID-19 Vaccine (8 - Moderna risk 2024-25 season) 11/23/2023   INFLUENZA VACCINE  03/02/2024   HEMOGLOBIN A1C  06/17/2024   OPHTHALMOLOGY EXAM  01/26/2025   FOOT EXAM  02/20/2025   Medicare Annual Wellness (AWV)  02/20/2025   DTaP/Tdap/Td (3 - Td or Tdap) 11/24/2033   Colonoscopy  01/29/2034   Pneumococcal Vaccine: 50+ Years  Completed   Zoster Vaccines- Shingrix  Completed   Hepatitis B Vaccines  Aged Out   HPV VACCINES  Aged Out   Meningococcal  B Vaccine  Aged Out   Hepatitis C Screening  Discontinued    Health Maintenance  Health Maintenance Due  Topic Date Due   COVID-19 Vaccine (8 - Moderna risk 2024-25 season) 11/23/2023   Health Maintenance Items Addressed: Discussed the need to update flu and covid vaccines annually  Additional Screening:  Vision Screening: Recommended annual ophthalmology exams for early detection of glaucoma and other disorders of the eye. Up to date  [facility] Would you like a referral to an eye doctor? No    Dental Screening: Recommended annual dental exams for proper oral hygiene  Community Resource Referral / Chronic Care Management: CRR required this visit?  No   CCM required this visit?  No   Plan:    I have personally reviewed and noted the following in the patient's chart:   Medical and social history Use of alcohol, tobacco or illicit drugs  Current medications and supplements including opioid prescriptions. Patient is not currently  taking opioid prescriptions. Functional ability and status Nutritional status Physical activity Advanced directives List of other physicians Hospitalizations, surgeries, and ER visits in previous 12 months Vitals Screenings to include cognitive, depression, and falls Referrals and appointments  In addition, I have reviewed and discussed with patient certain preventive protocols, quality metrics, and best practice recommendations. A written personalized care plan for preventive services as well as general preventive health recommendations were provided to patient.   Angeline Fredericks, LPN   [id number]   After Visit Summary: (MyChart) Due to this being a telephonic visit, the after visit summary with patients personalized plan was offered to patient via MyChart   Notes: Nothing significant to report at this time.

## 2024-02-21 NOTE — Assessment & Plan Note (Signed)
 Patient euvolemic today. On Furosemide  20 mg daily, continue. Continue close f/u with cardiologist.

## 2024-02-21 NOTE — Patient Instructions (Addendum)
-   If you start to develop numbness, tinglings on your feet, lower legs please let me and your diabetes doctor know. Please check your feet daily before you go to bed to make sure there's no skin ras or wound.   - I have sent refill on Trajenda for you.   - Lets plan on following up in 6 months.

## 2024-02-21 NOTE — Progress Notes (Signed)
 Established Patient Office Visit Cardiologist:  Denyse Bathe, MD  Kidney transplant team: Kotzen, Elizabeth S, MD/[facility]  Endocrinologist: Therisa Leep MD, [facility] Vascular: Marea Selinda RAMAN, MD  Urologist: Helon Clotilda DELENA DEVONNA    Subjective  Patient ID: [name], male    DOB: [birth year]  Age: 68 y.o. MRN: [id number]  Chief Complaint  Patient presents with   Diabetes    He  has a past medical history of Acute exacerbation of CHF (congestive heart failure) (HCC) (12/16/2023), Acute on chronic congestive heart failure (HCC) (11/03/2023), Adenomatous polyp of colon (01/30/2024), AKI (acute kidney injury) (HCC) (12/16/2023), Aortic atherosclerosis (HCC), Arthritis, BPH with LUTS s/p UroLift with Dr. Penne in November 2024 on finasteride , Flomax , and Sanctura   and sees [facility], BPH with LUTS, s/p urolift with Dr. Penne 06/2023, on Finasteride , Flomax , Sancture, sees, Carotid arterial disease (HCC), Chest pain (09/20/2017), Colon cancer screening (01/30/2024), Coronary artery disease, DDD (degenerative disc disease), lumbar, Dyspepsia (01/30/2024), Dyspnea, ESRD (end stage renal disease) (HCC), GERD (gastroesophageal reflux disease), History of blood transfusion, Hospitalization within last 30 days (06/03/2020), Hypertension, Long term current use of aspirin , Long term current use of immunosuppressive drug, Myocardial infarction (HCC) (2004), Myocardial infarction (HCC) (2005), On apixaban  therapy, Orthopnea (12/16/2023), PAF (paroxysmal atrial fibrillation) (HCC), Sepsis due to gram-negative UTI (HCC) (04/27/2020), Status post RIGHT kidney transplant (05/10/2015), Subconjunctival hemorrhage of right eye (12/15/2023), T2DM (type 2 diabetes mellitus) (HCC), Unstable angina (HCC), and Vitamin D deficiency.  HPI Discussed the use of AI scribe software for clinical note transcription with the patient, who gave verbal consent to proceed.  History of Present Illness [name] is a  68 year old male presents for follow up.   He experienced urinary issues after taking trospium  chloride, including reduced urine output and difficulty urinating, which required effort to void. These symptoms began after four days of medication use, leading him to discontinue the drug. He noted improvement in urination after stopping the medication. He is no longer taking this medication. He has a f/u appointment with his urologist in August 2025. No worsening LUTS symptoms.   He has a history of heart failure and is currently taking Lasix  20 mg daily. He f/u with his cardiologist Denyse Bathe, MD closely.  Patient's renal function from 01/05/2024 shows stable GFR, creatinine.  He has a history of renal transplant and is established with [facility] transplant team.   His diabetes is managed by endocrinologist Therisa Leep MD, [facility] clinic. Management includes Tradjenta , Humalog and Lantus .   He is requesting refill on Tradjenta .  His next appointment with his endocrinologist is not to November 2025.  His last A1c was 6.6% in June.  He is on multiple medications including amiodarone , daily aspirin , atorvastatin , Cellcept , vitamin D, finasteride , and losartan . He uses a CPAP machine every night and checks his blood pressure at home, reporting readings around 149/122 mmHg. No numbness or tingling of feet, legs.   He was seen by GI and underwent EGD and colonoscopy on 01/30/24 with Dr. Therisa at [place] [facility]. Takes Omeprazole 20 mg daily.   ROS As per HPI   Objective:     BP 120/70 (BP Location: Right Arm, Cuff Size: Normal)   Pulse (!) 51   Temp 97.7 F (36.5 C) (Oral)   Ht 5' 8 (1.727 m)   Wt 168 lb 12.8 oz (76.6 kg)   SpO2 95%   BMI 25.67 kg/m      11/14/2023    9:10 AM  Depression screen  PHQ 2/9  Decreased Interest 0  Down, Depressed, Hopeless 0  PHQ - 2 Score 0  Altered sleeping 0  Tired, decreased energy 0  Change in appetite 0  Feeling bad or failure about yourself  0  Trouble  concentrating 0  Moving slowly or fidgety/restless 0  Suicidal thoughts 0  PHQ-9 Score 0      11/14/2023    9:10 AM  GAD 7 : Generalized Anxiety Score  Nervous, Anxious, on Edge 0  Control/stop worrying 0  Worry too much - different things 0  Trouble relaxing 0  Restless 0  Easily annoyed or irritable 0  Afraid - awful might happen 0  Total GAD 7 Score 0      11/14/2023    9:10 AM  Depression screen PHQ 2/9  Decreased Interest 0  Down, Depressed, Hopeless 0  PHQ - 2 Score 0  Altered sleeping 0  Tired, decreased energy 0  Change in appetite 0  Feeling bad or failure about yourself  0  Trouble concentrating 0  Moving slowly or fidgety/restless 0  Suicidal thoughts 0  PHQ-9 Score 0      11/14/2023    9:10 AM  GAD 7 : Generalized Anxiety Score  Nervous, Anxious, on Edge 0  Control/stop worrying 0  Worry too much - different things 0  Trouble relaxing 0  Restless 0  Easily annoyed or irritable 0  Afraid - awful might happen 0  Total GAD 7 Score 0   SDOH Screenings   Food Insecurity: No Food Insecurity (02/20/2024)  Housing: Low Risk  (02/20/2024)  Recent Concern: Housing - High Risk (12/22/2023)  Transportation Needs: No Transportation Needs (02/20/2024)  Utilities: Not At Risk (01/18/2024)   Received from [facility]  Depression [phone]): Low Risk  (11/14/2023)  Financial Resource Strain: Low Risk  (02/20/2024)  Physical Activity: Sufficiently Active (02/20/2024)  Social Connections: Moderately Integrated (02/20/2024)  Stress: Patient Declined (02/20/2024)  Tobacco Use: Medium Risk (02/21/2024)     Physical Exam HENT:     Head: Normocephalic and atraumatic.     Mouth/Throat:     Mouth: Mucous membranes are moist.  Cardiovascular:     Rate and Rhythm: Normal rate.     Pulses:          Dorsalis pedis pulses are 2+ on the right side and 2+ on the left side.       Posterior tibial pulses are 2+ on the right side and 2+ on the left side.   Musculoskeletal:     Cervical back: Neck supple.     Right lower leg: No edema.     Left lower leg: No edema.     Right foot: No foot drop or prominent metatarsal heads.     Left foot: No foot drop or prominent metatarsal heads.       Feet:  Feet:     Right foot:     Protective Sensation: 8 sites tested.  5 sites sensed.     Skin integrity: No callus or dry skin.     Left foot:     Protective Sensation: 8 sites tested.  6 sites sensed.     Skin integrity: No callus or dry skin.     Comments: Reduced monofilament sensation over the red markings as above.  Skin:    General: Skin is warm.  Neurological:     Mental Status: He is alert.     Gait: Gait abnormal (slow, antalgic gait, uses cane  for ambulation).  Psychiatric:        Mood and Affect: Mood normal.        No results found for any visits on 02/21/24.  The ASCVD Risk score (Arnett DK, et al., 2019) failed to calculate for the following reasons:   Risk score cannot be calculated because patient has a medical history suggesting prior/existing ASCVD     Assessment & Plan:   Coronary artery disease involving coronary bypass graft of native heart with other forms of angina pectoris [facility]) Assessment & Plan: Risk factors management discussed. On Atorvastatin  40 mg, Aspirin  81 mg daily Carvedilol  3.125 mg BID. Established with cardiology, refill and management per cardiology.    Essential (primary) hypertension Assessment & Plan: BBP on arrival was elevated. Repeat BP within normal range. Continue Hydralazine  25 mg BID, Losartan  100 mg daily per cardiology.    Bilateral carotid artery stenosis Assessment & Plan: Patient is established with vascular surgery department and follows up with them annually.      Gastroesophageal reflux disease without esophagitis Assessment & Plan: Underwent EGD on 01/30/24 without esophagitis, symptoms stable on Omeprazole 20 mg daily, continue.    Encounter for diabetic foot exam  (HCC) Assessment & Plan: Reduced sensation on both foot as indicated by the monofilament test, consistent with diabetic neuropathy. Advise patient to perform daily foot inspections for new lesions, cuts, changes in skin condition, appropriate footwear. Recommend f/u with us  or endocrinologist if new symptoms such as numbness, tinging or foot pain.    Heart failure with mildly reduced ejection fraction (HFmrEF) (HCC) Assessment & Plan: Patient euvolemic today. On Furosemide  20 mg daily, continue. Continue close f/u with cardiologist.    Type 2 diabetes mellitus with stage 3a chronic kidney disease, with long-term current use of insulin  (HCC) Assessment & Plan: Chronic, stable. His diabetes is managed by endocrinologist Therisa Leep MD, [facility] clinic. His last A1c was 6.6% in June. He is on Tradjenta , Humalog and Lantus . He is requesting refill on Trajenta 5 mg daily as his appointment with endocrinologist is not till 06/2024. Refill sent. I also reviewed his CGM BG reading (165 today).   Aftercare following organ transplant Assessment & Plan: Continue f/u with Kidney transplant team: Kotzen, Elizabeth S, MD/[facility]    Immunosuppression [facility]) Assessment & Plan: On chronic immunosuppressive therapy with Tacrolimus  and Mycophenolate  s/p right kidney transplant in 05/10/2015. At increased risk of infection. Continue f/u with transplant clinic with [facility].     Vitamin D deficiency Assessment & Plan: Most recent vitamin D has been normal.    Diabetic polyneuropathy associated with type 2 diabetes mellitus (HCC) Assessment & Plan: Reduced sensation on both foot as indicated by the monofilament test, consistent with diabetic neuropathy. Advise patient to perform daily foot inspections for new lesions, cuts, changes in skin condition, appropriate footwear. Recommend f/u with us  or endocrinologist if new symptoms such as numbness, tinging or foot pain.    Benign prostatic hyperplasia, unspecified  whether lower urinary tract symptoms present Assessment & Plan: Stop taking trospium  chloride, reached out to patient's urologist through secure chat. No worsening of LUTS symptoms since d/c of medication. His urologist plans on discussing this during his upcoming appointment in August.    Other orders -     linaGLIPtin ; Take 1 tablet (5 mg total) by mouth daily.  Dispense: 90 tablet; Refill: 1  I spent 45 minutes on the day of this face-to-face encounter reviewing the patient's medical history, current medications, ongoing concerns, recent egd/colonoscopy results, specialist  note (coordinating care with his specialist) and reviewing the assessment and plan with the patient. This time also included counseling the patient on their health conditions and management options.   Return in about 6 months (around 08/23/2024) for Chronic follow up with Dr. Abbey .   Luke Abbey, MD

## 2024-02-21 NOTE — Patient Instructions (Signed)
 [name] , Thank you for taking time out of your busy schedule to complete your Annual Wellness Visit with me. I enjoyed our conversation and look forward to speaking with you again next year. I, as well as your care team,  appreciate your ongoing commitment to your health goals. Please review the following plan we discussed and let me know if I can assist you in the future. Your Game plan/ To Do List    Referrals: If you haven't heard from the office you've been referred to, please reach out to them at the phone provided.  Remember to update your flu and covid vaccines annually Follow up Visits: Next Medicare AWV with our clinical staff: 02/25/25 @ 10:50   Have you seen your provider in the last 6 months (3 months if uncontrolled diabetes)? Yes Next Office Visit with your provider: 05/15/24  Clinician Recommendations:  Aim for 30 minutes of exercise or brisk walking, 6-8 glasses of water , and 5 servings of fruits and vegetables each day.       This is a list of the screening recommended for you and due dates:  Health Maintenance  Topic Date Due   COVID-19 Vaccine (8 - Moderna risk 2024-25 season) 11/23/2023   Flu Shot  03/02/2024   Hemoglobin A1C  06/17/2024   Eye exam for diabetics  01/26/2025   Complete foot exam   02/20/2025   Medicare Annual Wellness Visit  02/20/2025   DTaP/Tdap/Td vaccine (3 - Td or Tdap) 11/24/2033   Colon Cancer Screening  01/29/2034   Pneumococcal Vaccine for age over 79  Completed   Zoster (Shingles) Vaccine  Completed   Hepatitis B Vaccine  Aged Out   HPV Vaccine  Aged Out   Meningitis B Vaccine  Aged Out   Hepatitis C Screening  Discontinued    Advanced directives: (Copy Requested) Please bring a copy of your health care power of attorney and living will to the office to be added to your chart at your convenience. You can mail to [facility] [address] [place], [place] [address] or email to [email or web address] Advance  Care Planning is important because it:  [x]  Makes sure you receive the medical care that is consistent with your values, goals, and preferences  [x]  It provides guidance to your family and loved ones and reduces their decisional burden about whether or not they are making the right decisions based on your wishes.

## 2024-02-21 NOTE — Assessment & Plan Note (Signed)
 Patient is established with vascular surgery department and follows up with them annually.

## 2024-02-21 NOTE — Assessment & Plan Note (Signed)
 Risk factors management discussed. On Atorvastatin  40 mg, Aspirin  81 mg daily Carvedilol  3.125 mg BID. Established with cardiology, refill and management per cardiology.

## 2024-02-21 NOTE — Assessment & Plan Note (Signed)
 Stop taking trospium  chloride, reached out to patient's urologist through secure chat. No worsening of LUTS symptoms since d/c of medication. His urologist plans on discussing this during his upcoming appointment in August.

## 2024-02-23 NOTE — Progress Notes (Unsigned)
 03/06/2024 8:50 AM   [name] 26-Jan-1956 [id number]  Referring provider: Abbey Bruckner, MD [address] [place],  [place] [address]  Urological history: 1. BPH with LU TS - PSA (01/2024) 0.2 - TRUS (2022) 43 cc - cysto (2022) NED - UDS (2024) non diagnostic - UroLift (06/2023)  - managed on finasteride  5 mg daily, tamsulosin  0.8 mg daily and trospium  20 mg at bedtime  2. Nocturia - oxybutynin  caused retention/severe constipation   3. Renal transplant  - serum creatinine (01/2024) 1.18, eGFR 67 - right renal transplant (2016)  - followed by [facility]  4. ED  5. Pyelonephritis  - hospitalized (2021)  Chief Complaint  Patient presents with   Follow-up   HPI: [name] is a 68 y.o. man who presents today for 6 week follow up.    Previous records reviewed.   At his visit on January 24, 2024, he was complaining of nocturia x 5.  We had him restart his trospium  IR 20 mg at bedtime.  After 4 days of the medication, he experienced urinary retention.  He has since stopped the trospium .  Today, he states he feels well.  He is still having nocturia 5-7 times nightly, but he attributes that to taking fluid pills.  He no longer finds it bothersome.  He did mention that he does have some perineal pain when he uses an exercise bike, but it goes away immediately after he stops using the bike.  He states this has been going on for the last year.  Patient denies any modifying or aggravating factors.  Patient denies any recent UTI's, gross hematuria, dysuria or suprapubic/flank pain.  Patient denies any fevers, chills, nausea or vomiting.      PMH: Past Medical History:  Diagnosis Date   Acute exacerbation of CHF (congestive heart failure) (HCC) 12/16/2023   Acute on chronic congestive heart failure (HCC) 11/03/2023   Adenomatous polyp of colon 01/30/2024   AKI (acute kidney injury) (HCC) 12/16/2023   Aortic atherosclerosis (HCC)    Arthritis    BPH with LUTS s/p  UroLift with Dr. Penne in November 2024 on finasteride , Flomax , and Sanctura   and [facility]    BPH with LUTS, s/p urolift with Dr. Penne 06/2023, on Finasteride , Flomax , Sancture, sees    Carotid arterial disease (HCC)    a.) s/p LEFT CEA on 09/06/2019   Chest pain 09/20/2017   Colon cancer screening 01/30/2024   Coronary artery disease    a.) MI with stents x 2 in 2004; b.) MI with stent x 1 2005; c.) s/p CABG x 2 06/02/2006 (LIMA-LAD, SVG-PDA); d.) LHC/PCI 08/25/2007: 80% oD1 (2.5 x 12 mm Xience V DES)   DDD (degenerative disc disease), lumbar    Dyspepsia 01/30/2024   Dyspnea    ESRD (end stage renal disease) (HCC)    a.) s/p RIGHT renal 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/2015   GERD (gastroesophageal reflux disease)    History of blood transfusion    Hospitalization within last 30 days 06/03/2020   Hypertension    Long term current use of aspirin     Long term current use of immunosuppressive drug    a.) mycophenolate  + tacrolimus    Myocardial infarction [facility]) 2004   a.) details unclear; stents x 2 (unknown type/location)   Myocardial infarction (HCC) 2005   a.) details unclear; stent x 1 (unknown type/location)   On apixaban  therapy    Orthopnea 12/16/2023   PAF (paroxysmal atrial fibrillation) (HCC)    a.)  CHA2DS2-VASc = 5 (age, HTN, vascular disease history/MI, T2DM) as of 06/10/2023; b.) cardiac rate/rhythm maintained intrinsically without pharmacological intervention; chronically anticoagulated using apixaban    Sepsis due to gram-negative UTI (HCC) 04/27/2020   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   From DM and HTN, f/u with [facility] every 6 months   Subconjunctival hemorrhage of right eye 12/15/2023   T2DM (type 2 diabetes mellitus) (HCC)    Unstable angina (HCC)    Vitamin D deficiency     Surgical History: Past Surgical History:  Procedure Laterality Date   CATARACT EXTRACTION Bilateral    CATARACT EXTRACTION W/PHACO Right 02/03/2017   Procedure: CATARACT EXTRACTION  PHACO AND INTRAOCULAR LENS PLACEMENT (IOC);  Surgeon: Mittie Gaskin, MD;  Location: [facility] ORS;  Service: Ophthalmology;  Laterality: Right;  US  00:35.8 AP% 12.8 CDE 4.57 Fluid lot # [id number]   COLONOSCOPY N/A 01/30/2024   Procedure: COLONOSCOPY;  Surgeon: Therisa Bi, MD;  Location: [facility] ENDOSCOPY;  Service: Gastroenterology;  Laterality: N/A;  IDDM   COLONOSCOPY WITH PROPOFOL  N/A 12/30/2021   Procedure: COLONOSCOPY WITH PROPOFOL ;  Surgeon: Therisa Bi, MD;  Location: [facility] ENDOSCOPY;  Service: Gastroenterology;  Laterality: N/A;   CORONARY ANGIOPLASTY WITH STENT PLACEMENT Left 2004   stents x 2   CORONARY ANGIOPLASTY WITH STENT PLACEMENT Left 2005   stent x 1   CORONARY ANGIOPLASTY WITH STENT PLACEMENT Left 08/25/2007   Procedure: CORONARY ANGIOPLASTY WITH STENT PLACEMENT; Location: [facility]; Surgeon: Valinda Cage, MD   CORONARY ARTERY BYPASS GRAFT N/A 06/08/2006   Procedure: CORONARY ARTERY BYPASS GRAFT; Location: [facility]; Surgeon: Maude Sharps, MD   CYSTOSCOPY WITH INSERTION OF UROLIFT N/A 06/13/2023   Procedure: CYSTOSCOPY WITH INSERTION OF UROLIFT;  Surgeon: Penne Knee, MD;  Location: [facility] ORS;  Service: Urology;  Laterality: N/A;   ENDARTERECTOMY Left 09/06/2019   Procedure: ENDARTERECTOMY CAROTID;  Surgeon: Marea Selinda RAMAN, MD;  Location: [facility] ORS;  Service: Vascular;  Laterality: Left;   ESOPHAGOGASTRODUODENOSCOPY N/A 01/30/2024   Procedure: EGD (ESOPHAGOGASTRODUODENOSCOPY);  Surgeon: Therisa Bi, MD;  Location: [facility] ENDOSCOPY;  Service: Gastroenterology;  Laterality: N/A;   HIP FRACTURE SURGERY     KIDNEY TRANSPLANT Right 05/10/2015   LEFT HEART CATH AND CORONARY ANGIOGRAPHY Left 05/20/2006   Procedure: LEFT HEART CATH AND CORONARY ANGIOGRAPHY; Location: [facility]; Surgeon: Denyse Bathe, MD   LEFT HEART CATH AND CORONARY ANGIOGRAPHY Left 11/08/2023   Procedure: LEFT HEART CATH AND CORONARY ANGIOGRAPHY with possible intervention;  Surgeon: Bathe Denyse LABOR, MD;  Location: [facility] INVASIVE CV  LAB;  Service: Cardiovascular;  Laterality: Left;   LEFT HEART CATH AND CORS/GRAFTS ANGIOGRAPHY Left 08/10/2007   Procedure: LEFT HEART CATH AND CORS/GRAFTS ANGIOGRAPHY; Location: [facility]; Surgeon: Denyse Bathe, MD   LEFT HEART CATH AND CORS/GRAFTS ANGIOGRAPHY Left 05/04/2011   Procedure: LEFT HEART CATH AND CORS/GRAFTS ANGIOGRAPHY; Location: [facility]; Surgeon: Denyse Bathe, MD   POLYPECTOMY  01/30/2024   Procedure: POLYPECTOMY, INTESTINE;  Surgeon: Therisa Bi, MD;  Location: [facility] ENDOSCOPY;  Service: Gastroenterology;;    Home Medications:  Allergies as of 03/06/2024       Reactions   Oxybutynin  Other (See Comments)   Incomplete bladder emptying   Trospium  Chloride Other (See Comments)   Urinary Retention        Medication List        Accurate as of March 06, 2024  8:50 AM. If you have any questions, ask your nurse or doctor.          STOP taking these medications    trospium  20 MG tablet Commonly  known as: SANCTURA  Stopped by: CLOTILDA CORNWALL       TAKE these medications    amiodarone  200 MG tablet Commonly known as: PACERONE  Take 1 tablet (200 mg total) by mouth daily.   ascorbic acid 500 MG tablet Commonly known as: VITAMIN C Take 500 mg by mouth daily.   aspirin  EC 81 MG tablet Take 1 tablet (81 mg total) by mouth daily at 6 (six) AM.   atorvastatin  40 MG tablet Commonly known as: LIPITOR Take 40 mg by mouth at bedtime.   Boostrix 5-2.5-18.5 LF-MCG/0.5 injection Generic drug: Tdap Inject 0.5 mLs into the muscle once.   CellCept  250 MG capsule Generic drug: mycophenolate  Take 500 mg by mouth 2 (two) times daily.   Eliquis  5 MG Tabs tablet Generic drug: apixaban  Take 5 mg by mouth 2 (two) times daily.   finasteride  5 MG tablet Commonly known as: PROSCAR  Take 1 tablet (5 mg total) by mouth daily.   FreeStyle Libre 2 Sensor Misc by Does not apply route.   FreeStyle Libre 2 Plus Sensor Misc 1 each by Other route every 14 (fourteen) days.    furosemide  20 MG tablet Commonly known as: LASIX  Take 1 tablet (20 mg total) by mouth daily.   HumaLOG KwikPen 100 UNIT/ML KwikPen Generic drug: insulin  lispro Inject 10-12 Units into the skin 3 (three) times daily. Inject 10 units daily at breakfast, 10 units at lunch and up to 10 units or less at supper per SSI   hydrALAZINE  25 MG tablet Commonly known as: APRESOLINE  Take 1 tablet (25 mg total) by mouth 2 (two) times daily.   Insulin  Pen Needle 33G X 4 MM Misc To use with insulin  pen 4 times per day. E 13.9   Lantus  SoloStar 100 UNIT/ML Solostar Pen Generic drug: insulin  glargine Inject 30 Units into the skin at bedtime.   linagliptin  5 MG Tabs tablet Commonly known as: TRADJENTA  Take 1 tablet (5 mg total) by mouth daily.   losartan  100 MG tablet Commonly known as: Cozaar  Take 1 tablet (100 mg total) by mouth daily.   nitroGLYCERIN  0.4 MG SL tablet Commonly known as: Nitrostat  Place 1 tablet (0.4 mg total) under the tongue every 5 (five) minutes as needed for chest pain.   omeprazole 20 MG capsule Commonly known as: PRILOSEC Take 20 mg by mouth daily.   Prograf  1 MG capsule Generic drug: tacrolimus  Take 1 mg by mouth every morning.   tacrolimus  0.5 MG capsule Commonly known as: PROGRAF  Take 0.5 mg by mouth at bedtime.   Shingrix injection Generic drug: Zoster Vaccine Adjuvanted Inject 0.5 mLs into the muscle once.   tamsulosin  0.4 MG Caps capsule Commonly known as: FLOMAX  Take 2 capsules (0.8 mg total) by mouth daily.   Vitamin D 50 MCG (2000 UT) Caps Take 2,000 Units by mouth daily.        Allergies:  Allergies  Allergen Reactions   Oxybutynin  Other (See Comments)    Incomplete bladder emptying   Trospium  Chloride Other (See Comments)    Urinary Retention    Family History: Family History  Problem Relation Age of Onset   Osteoporosis Mother    Drug abuse Sister    Drug abuse Brother     Social History:  reports that he has quit smoking.  He has never used smokeless tobacco. He reports that he does not drink alcohol and does not use drugs.  ROS: Pertinent ROS in HPI  Physical Exam: BP (!) 172/55   Pulse ROLLEN)  54   Ht 5' 8 (1.727 m)   Wt 167 lb (75.8 kg)   BMI 25.39 kg/m   Constitutional:  Well nourished. Alert and oriented, No acute distress. HEENT: [place] AT, moist mucus membranes.  Trachea midline Cardiovascular: No clubbing, cyanosis, or edema. Respiratory: Normal respiratory effort, no increased work of breathing. Neurologic: Grossly intact, no focal deficits, moving all 4 extremities. Psychiatric: Normal mood and affect.   Laboratory Data: See EPIC and HPI I have reviewed the labs.  See HPI.     Pertinent Imaging: N/A  Assessment & Plan:    1. BPH with LU TS - stable symptoms  - no signs of retention, infection or malignancy  - PSA up to date  - most bothersome symptoms are nocturia x 5, failed Sanctura , but it is no longer bothersome to him, I wonder if his episode with retention on the trospium  made it so he is more accepting of nocturia and emptying his bladder versus going into retention - encouraged avoiding bladder irritants, fluid restriction before bedtime and timed voiding's - Continue tamsulosin  0.8 mg daily and finasteride  5 mg daily - educated on red flag symptoms: acute retention, gross hematuria, fever, severe pain - advised to call clinic or go to the ED if these occur - return to clinic in 12 months symptom re-evaluation   2. Nocturia - Risk factors of hypertension, diabetes, heart disease and BPH - Experienced urinary retention with trospium  - Currently not bothered by the nocturia - Will reassess when he returns in a year   Return in about 1 year (around 03/06/2025) for PSA/DRE .  These notes generated with voice recognition software. I apologize for typographical errors.  CLOTILDA HELON RIGGERS  [facility] [address]  [address] [place], [place]  [address] [phone]

## 2024-02-27 ENCOUNTER — Ambulatory Visit (INDEPENDENT_AMBULATORY_CARE_PROVIDER_SITE_OTHER): Admitting: Cardiovascular Disease

## 2024-02-27 ENCOUNTER — Encounter: Payer: Self-pay | Admitting: Cardiovascular Disease

## 2024-02-27 VITALS — BP 130/56 | HR 49 | Ht 68.0 in | Wt 167.8 lb

## 2024-02-27 DIAGNOSIS — I10 Essential (primary) hypertension: Secondary | ICD-10-CM

## 2024-02-27 DIAGNOSIS — I480 Paroxysmal atrial fibrillation: Secondary | ICD-10-CM | POA: Diagnosis not present

## 2024-02-27 DIAGNOSIS — I5022 Chronic systolic (congestive) heart failure: Secondary | ICD-10-CM | POA: Diagnosis not present

## 2024-02-27 DIAGNOSIS — Z940 Kidney transplant status: Secondary | ICD-10-CM | POA: Diagnosis not present

## 2024-02-27 DIAGNOSIS — D849 Immunodeficiency, unspecified: Secondary | ICD-10-CM

## 2024-02-27 DIAGNOSIS — I25708 Atherosclerosis of coronary artery bypass graft(s), unspecified, with other forms of angina pectoris: Secondary | ICD-10-CM

## 2024-02-27 NOTE — Progress Notes (Signed)
 Cardiology Office Note   Date:  02/27/2024   ID:  [name], [name] [birth year], MRN [id number]  PCP:  Leonard Bruckner, MD  Cardiologist:  Denyse Bathe, MD      History of Present Illness: [name] is a 68 y.o. male who presents for  Chief Complaint  Patient presents with   Follow-up    1 month follow up    Has bradycardia, but no chest pain/SOB or dizziness.      Past Medical History:  Diagnosis Date   Acute exacerbation of CHF (congestive heart failure) (HCC) 12/16/2023   Acute on chronic congestive heart failure (HCC) 11/03/2023   Adenomatous polyp of colon 01/30/2024   AKI (acute kidney injury) (HCC) 12/16/2023   Aortic atherosclerosis (HCC)    Arthritis    BPH with LUTS s/p UroLift with Dr. Penne in November 2024 on finasteride , Flomax , and Sanctura   and [facility]    BPH with LUTS, s/p urolift with Dr. Penne 06/2023, on Finasteride , Flomax , Sancture, sees    Carotid arterial disease (HCC)    a.) s/p LEFT CEA on 09/06/2019   Chest pain 09/20/2017   Colon cancer screening 01/30/2024   Coronary artery disease    a.) MI with stents x 2 in 2004; b.) MI with stent x 1 2005; c.) s/p CABG x 2 06/02/2006 (LIMA-LAD, SVG-PDA); d.) LHC/PCI 08/25/2007: 80% oD1 (2.5 x 12 mm Xience V DES)   DDD (degenerative disc disease), lumbar    Dyspepsia 01/30/2024   Dyspnea    ESRD (end stage renal disease) (HCC)    a.) s/p RIGHT renal 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/2015   GERD (gastroesophageal reflux disease)    History of blood transfusion    Hospitalization within last 30 days 06/03/2020   Hypertension    Long term current use of aspirin     Long term current use of immunosuppressive drug    a.) mycophenolate  + tacrolimus    Myocardial infarction [facility]) 2004   a.) details unclear; stents x 2 (unknown type/location)   Myocardial infarction (HCC) 2005   a.) details unclear; stent x 1 (unknown type/location)   On apixaban  therapy    Orthopnea 12/16/2023   PAF  (paroxysmal atrial fibrillation) (HCC)    a.) CHA2DS2-VASc = 5 (age, HTN, vascular disease history/MI, T2DM) as of 06/10/2023; b.) cardiac rate/rhythm maintained intrinsically without pharmacological intervention; chronically anticoagulated using apixaban    Sepsis due to gram-negative UTI (HCC) 04/27/2020   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   From DM and HTN, f/u with [facility] every 6 months   Subconjunctival hemorrhage of right eye 12/15/2023   T2DM (type 2 diabetes mellitus) (HCC)    Unstable angina (HCC)    Vitamin D deficiency      Past Surgical History:  Procedure Laterality Date   CATARACT EXTRACTION Bilateral    CATARACT EXTRACTION W/PHACO Right 02/03/2017   Procedure: CATARACT EXTRACTION PHACO AND INTRAOCULAR LENS PLACEMENT (IOC);  Surgeon: Mittie Gaskin, MD;  Location: [facility] ORS;  Service: Ophthalmology;  Laterality: Right;  US  00:35.8 AP% 12.8 CDE 4.57 Fluid lot # [id number]   COLONOSCOPY N/A 01/30/2024   Procedure: COLONOSCOPY;  Surgeon: Therisa Bi, MD;  Location[facility] ENDOSCOPY;  Service: Gastroenterology;  Laterality: N/A;  IDDM   COLONOSCOPY WITH PROPOFOL  N/A 12/30/2021   Procedure: COLONOSCOPY WITH PROPOFOL ;  Surgeon: Therisa Bi, MD;  Location: [facility] ENDOSCOPY;  Service: Gastroenterology;  Laterality: N/A;   CORONARY ANGIOPLASTY WITH STENT PLACEMENT Left 2004   stents x 2   CORONARY  ANGIOPLASTY WITH STENT PLACEMENT Left 2005   stent x 1   CORONARY ANGIOPLASTY WITH STENT PLACEMENT Left 08/25/2007   Procedure: CORONARY ANGIOPLASTY WITH STENT PLACEMENT; Location: [facility]; Surgeon: Valinda Cage, MD   CORONARY ARTERY BYPASS GRAFT N/A 06/08/2006   Procedure: CORONARY ARTERY BYPASS GRAFT; Location: [facility]; Surgeon: Maude Sharps, MD   CYSTOSCOPY WITH INSERTION OF UROLIFT N/A 06/13/2023   Procedure: CYSTOSCOPY WITH INSERTION OF UROLIFT;  Surgeon: Penne Knee, MD;  Location: [facility] ORS;  Service: Urology;  Laterality: N/A;   ENDARTERECTOMY Left 09/06/2019    Procedure: ENDARTERECTOMY CAROTID;  Surgeon: Marea Selinda RAMAN, MD;  Location: [facility] ORS;  Service: Vascular;  Laterality: Left;   ESOPHAGOGASTRODUODENOSCOPY N/A 01/30/2024   Procedure: EGD (ESOPHAGOGASTRODUODENOSCOPY);  Surgeon: Therisa Bi, MD;  Location: [facility] ENDOSCOPY;  Service: Gastroenterology;  Laterality: N/A;   HIP FRACTURE SURGERY     KIDNEY TRANSPLANT Right 05/10/2015   LEFT HEART CATH AND CORONARY ANGIOGRAPHY Left 05/20/2006   Procedure: LEFT HEART CATH AND CORONARY ANGIOGRAPHY; Location: [facility]; Surgeon: Denyse Bathe, MD   LEFT HEART CATH AND CORONARY ANGIOGRAPHY Left 11/08/2023   Procedure: LEFT HEART CATH AND CORONARY ANGIOGRAPHY with possible intervention;  Surgeon: Bathe Denyse LABOR, MD;  Location: [facility] INVASIVE CV LAB;  Service: Cardiovascular;  Laterality: Left;   LEFT HEART CATH AND CORS/GRAFTS ANGIOGRAPHY Left 08/10/2007   Procedure: LEFT HEART CATH AND CORS/GRAFTS ANGIOGRAPHY; Location: [facility]; Surgeon: Denyse Bathe, MD   LEFT HEART CATH AND CORS/GRAFTS ANGIOGRAPHY Left 05/04/2011   Procedure: LEFT HEART CATH AND CORS/GRAFTS ANGIOGRAPHY; Location: [facility]; Surgeon: Denyse Bathe, MD   POLYPECTOMY  01/30/2024   Procedure: POLYPECTOMY, INTESTINE;  Surgeon: Therisa Bi, MD;  Location: [facility] ENDOSCOPY;  Service: Gastroenterology;;     Current Outpatient Medications  Medication Sig Dispense Refill   amiodarone  (PACERONE ) 200 MG tablet Take 1 tablet (200 mg total) by mouth daily. 90 tablet 1   ascorbic acid (VITAMIN C) 500 MG tablet Take 500 mg by mouth daily.     aspirin  EC 81 MG EC tablet Take 1 tablet (81 mg total) by mouth daily at 6 (six) AM.     atorvastatin  (LIPITOR) 40 MG tablet Take 40 mg by mouth at bedtime.     BOOSTRIX 5-2.5-18.5 LF-MCG/0.5 injection Inject 0.5 mLs into the muscle once.     CELLCEPT  250 MG capsule Take 500 mg by mouth 2 (two) times daily.     Cholecalciferol  (VITAMIN D) 50 MCG (2000 UT) CAPS Take 2,000 Units by mouth daily.     Continuous Blood Gluc Sensor  (FREESTYLE LIBRE 2 SENSOR) MISC by Does not apply route.     Continuous Glucose Sensor (FREESTYLE LIBRE 2 PLUS SENSOR) MISC 1 each by Other route every 14 (fourteen) days.     ELIQUIS  5 MG TABS tablet Take 5 mg by mouth 2 (two) times daily.      finasteride  (PROSCAR ) 5 MG tablet Take 1 tablet (5 mg total) by mouth daily. 90 tablet 3   furosemide  (LASIX ) 20 MG tablet Take 1 tablet (20 mg total) by mouth daily. 90 tablet 1   HUMALOG KWIKPEN 100 UNIT/ML KiwkPen Inject 10-12 Units into the skin 3 (three) times daily. Inject 10 units daily at breakfast, 10 units at lunch and up to 10 units or less at supper per SSI     hydrALAZINE  (APRESOLINE ) 25 MG tablet Take 1 tablet (25 mg total) by mouth 2 (two) times daily. 120 tablet 11   insulin  glargine (LANTUS  SOLOSTAR) 100 UNIT/ML Solostar Pen Inject 30 Units into  the skin at bedtime.     Insulin  Pen Needle 33G X 4 MM MISC To use with insulin  pen 4 times per day. E 13.9     linagliptin  (TRADJENTA ) 5 MG TABS tablet Take 1 tablet (5 mg total) by mouth daily. 90 tablet 1   losartan  (COZAAR ) 100 MG tablet Take 1 tablet (100 mg total) by mouth daily. 30 tablet 11   nitroGLYCERIN  (NITROSTAT ) 0.4 MG SL tablet Place 1 tablet (0.4 mg total) under the tongue every 5 (five) minutes as needed for chest pain. 100 tablet 3   omeprazole (PRILOSEC) 20 MG capsule Take 20 mg by mouth daily.     PROGRAF  1 MG capsule Take 1 mg by mouth every morning.     SHINGRIX injection Inject 0.5 mLs into the muscle once.     tacrolimus  (PROGRAF ) 0.5 MG capsule Take 0.5 mg by mouth at bedtime.     tamsulosin  (FLOMAX ) 0.4 MG CAPS capsule Take 2 capsules (0.8 mg total) by mouth daily. 180 capsule 3   trospium  (SANCTURA ) 20 MG tablet Take 1 tablet (20 mg total) by mouth at bedtime. 90 tablet 3   No current facility-administered medications for this visit.   Facility-Administered Medications Ordered in Other Visits  Medication Dose Route Frequency Provider Last Rate Last Admin   sodium  chloride flush (NS) 0.9 % injection 3 mL  3 mL Intravenous Q12H Fernand Alter A, MD        Allergies:   Oxybutynin  and Trospium  chloride    Social History:   reports that he has quit smoking. He has never used smokeless tobacco. He reports that he does not drink alcohol and does not use drugs.   Family History:  family history includes Drug abuse in his brother and sister; Osteoporosis in his mother.    ROS:     Review of Systems  Constitutional: Negative.   HENT: Negative.    Eyes: Negative.   Respiratory: Negative.    Gastrointestinal: Negative.   Genitourinary: Negative.   Musculoskeletal: Negative.   Skin: Negative.   Neurological: Negative.   Endo/Heme/Allergies: Negative.   Psychiatric/Behavioral: Negative.    All other systems reviewed and are negative.     All other systems are reviewed and negative.    PHYSICAL EXAM: VS:  BP (!) 130/56   Pulse (!) 49   Ht 5' 8 (1.727 m)   Wt 167 lb 12.8 oz (76.1 kg)   SpO2 97%   BMI 25.51 kg/m  , BMI Body mass index is 25.51 kg/m. Last weight:  Wt Readings from Last 3 Encounters:  02/27/24 167 lb 12.8 oz (76.1 kg)  02/21/24 168 lb 12.8 oz (76.6 kg)  02/21/24 168 lb 12.8 oz (76.6 kg)     Physical Exam Vitals reviewed.  Constitutional:      Appearance: Normal appearance. He is normal weight.  HENT:     Head: Normocephalic.     Nose: Nose normal.     Mouth/Throat:     Mouth: Mucous membranes are moist.  Eyes:     Pupils: Pupils are equal, round, and reactive to light.  Cardiovascular:     Rate and Rhythm: Normal rate and regular rhythm.     Pulses: Normal pulses.     Heart sounds: Normal heart sounds.  Pulmonary:     Effort: Pulmonary effort is normal.  Abdominal:     General: Abdomen is flat. Bowel sounds are normal.  Musculoskeletal:        General: Normal  range of motion.     Cervical back: Normal range of motion.  Skin:    General: Skin is warm.  Neurological:     General: No focal deficit present.      Mental Status: He is alert.  Psychiatric:        Mood and Affect: Mood normal.       EKG:   Recent Labs: 12/16/2023: B Natriuretic Peptide 684.4; TSH 2.987 12/18/2023: ALT 18; Hemoglobin 14.5; Magnesium  2.0; Platelets 145 12/21/2023: BUN 37; Creatinine, Ser 1.58; Potassium 4.3; Sodium 137    Lipid Panel    Component Value Date/Time   CHOL  10/20/2010 0400    94        ATP III CLASSIFICATION:  <200     mg/dL   Desirable  799-760  mg/dL   Borderline High  >=759    mg/dL   High          TRIG 845 (H) 10/23/2010 0600      Other studies Reviewed: Additional studies/ records that were reviewed today include:  Review of the above records demonstrates:       No data to display            ASSESSMENT AND PLAN:    ICD-10-CM   1. Coronary artery disease involving coronary bypass graft of native heart with other forms of angina pectoris (HCC)  I25.708     2. Heart failure with mildly reduced ejection fraction (HFmrEF) (HCC)  I50.22     3. Paroxysmal atrial fibrillation (HCC)  I48.0    sinus bradycardia, but no symptoms on 200 amiodrone.    4. Immunosuppression (HCC)  D84.9     5. right renal transplant on 05/10/2015, f/u with [facility] transplant team q6 monthly  Z94.0        Problem List Items Addressed This Visit       Cardiovascular and Mediastinum   Coronary artery disease - Primary (Chronic)   Paroxysmal atrial fibrillation (HCC) (Chronic)   Heart failure with mildly reduced ejection fraction (HFmrEF) (HCC) (Chronic)     Other   right renal transplant on 05/10/2015, f/u with [facility] transplant team q6 monthly (Chronic)   Immunosuppression (HCC) (Chronic)       Disposition:   Return in about 3 months (around 05/29/2024).    Total time spent: 35 minutes  Signed,  Denyse Bathe, MD  02/27/2024 9:46 AM    [facility]

## 2024-03-06 ENCOUNTER — Encounter: Payer: Self-pay | Admitting: Urology

## 2024-03-06 ENCOUNTER — Ambulatory Visit (INDEPENDENT_AMBULATORY_CARE_PROVIDER_SITE_OTHER): Admitting: Urology

## 2024-03-06 VITALS — BP 172/55 | HR 54 | Ht 68.0 in | Wt 167.0 lb

## 2024-03-06 DIAGNOSIS — N401 Enlarged prostate with lower urinary tract symptoms: Secondary | ICD-10-CM

## 2024-03-06 DIAGNOSIS — R351 Nocturia: Secondary | ICD-10-CM

## 2024-03-06 DIAGNOSIS — N138 Other obstructive and reflux uropathy: Secondary | ICD-10-CM

## 2024-03-06 MED ORDER — TAMSULOSIN HCL 0.4 MG PO CAPS
0.8000 mg | ORAL_CAPSULE | Freq: Every day | ORAL | 3 refills | Status: AC
Start: 2024-03-06 — End: ?

## 2024-04-10 ENCOUNTER — Ambulatory Visit: Admit: 2024-04-10 | Discharge: 2024-04-11 | Payer: Medicare (Managed Care)

## 2024-04-10 DIAGNOSIS — Z7960 Long term (current) use of unspecified immunomodulators and immunosuppressants: Secondary | ICD-10-CM | POA: Diagnosis not present

## 2024-04-10 DIAGNOSIS — Z940 Kidney transplant status: Secondary | ICD-10-CM | POA: Diagnosis not present

## 2024-04-11 NOTE — Telephone Encounter (Signed)
 Called patient to check in re: increased creatinine.  Patient reports he is doing well. Denies n/v/d. States he sometimes has diarrhea but that is dependent on his diet. Only drinking 2 bottles of water  a day  Encouraged patient to drink 4-5 bottles of water  a day and repeat labs.  Patient verbalized understanding and denies any other needs

## 2024-04-23 ENCOUNTER — Ambulatory Visit: Admit: 2024-04-23 | Discharge: 2024-04-24 | Payer: Medicare (Managed Care)

## 2024-04-23 DIAGNOSIS — Z7960 Long term (current) use of unspecified immunomodulators and immunosuppressants: Secondary | ICD-10-CM | POA: Diagnosis not present

## 2024-04-23 DIAGNOSIS — Z940 Kidney transplant status: Secondary | ICD-10-CM | POA: Diagnosis not present

## 2024-04-24 ENCOUNTER — Ambulatory Visit (INDEPENDENT_AMBULATORY_CARE_PROVIDER_SITE_OTHER)

## 2024-04-24 VITALS — BP 132/50 | HR 50 | Temp 98.3°F | Ht 68.0 in | Wt 173.0 lb

## 2024-04-24 DIAGNOSIS — E875 Hyperkalemia: Secondary | ICD-10-CM | POA: Diagnosis not present

## 2024-04-24 DIAGNOSIS — N1831 Chronic kidney disease, stage 3a: Secondary | ICD-10-CM | POA: Diagnosis not present

## 2024-04-24 DIAGNOSIS — I10 Essential (primary) hypertension: Secondary | ICD-10-CM | POA: Diagnosis not present

## 2024-04-24 NOTE — Assessment & Plan Note (Signed)
 Patient had labs done through [facility]/nephrology and was found to have serum Potassium of 5.2. Repeat BMP today.

## 2024-04-24 NOTE — Patient Instructions (Addendum)
 Check your BP daily and write it down for us  to review next time. If your BP is over 200/100 and or BP is over 180/90 with headache, vision change, stroke like symptoms you should be seen in the ED.    Otherwise send me your home BP reading over the next week. Also reach out to Dr. Fernand to see if you can be seen sooner if blood pressure continues to fluctuate.    I am repeating your electrolytes as you had elevated potassium during you lab yesterday. I want to make sure this gets normalized.

## 2024-04-24 NOTE — Assessment & Plan Note (Signed)
 BP fluctuating at home, unclear etiology. Compliant with medications, no change in diet. No new illness.  BP today within goal. No chest pain, palpitations, lower leg edema, dyspnea. Physical exam at baseline. Patient asymptomatic.  Currently on Hydralazine  25 mg BID, Losartan  100 mg daily and Midodrine 200 mg daily per cardiologist Dr. Fernand. Check BMP.  - Monitor blood pressure daily, record readings. - Seek emergency care if BP >200/100 with symptoms. - Send weekly BP readings to me.  - Consult cardiologist Dr. Fernand if fluctuations persist.

## 2024-04-24 NOTE — Progress Notes (Signed)
 Acute Office Visit  Subjective:    Patient ID: [name], male    DOB: 04-Sep-1955, 68 y.o.   MRN: [id number]  Chief Complaint  Patient presents with   Hypertension    HPI Discussed the use of AI scribe software for clinical note transcription with the patient, who gave verbal consent to proceed.  History of Present Illness [name] is a 68 year old male with hypertension who presents with fluctuating blood pressure readings.  For the past two weeks, he has experienced fluctuations in his blood pressure, with morning readings with as high as 200 SBP.  These fluctuations occur almost daily. No chest pain, palpitations, dizziness, shortness of breath, or leg swelling. He denies recent dietary changes, such as increased salt intake, and states he only drinks water , avoiding soda. Reports he has been compliant with his medications, no new diet.   His current medications include losartan  100 mg once daily in the morning, hydralazine  twice a day, linagliptin  in the morning, Lantus  30 units, Flomax , Prograf , Prilosec as needed, Lipitor, aspirin , vitamin C, and amiodarone  in the morning. He also takes a diuretic as needed for weight gain over three kilograms in two days or abdominal swelling. Denies episode of low blood glucose reading at home.   He experienced a slight headache last week but denies fever, chills, sore throat, or diarrhea.    ROS As per HPI    Objective:    BP (!) 132/50 (BP Location: Right Arm, Patient Position: Sitting, Cuff Size: Normal)   Pulse (!) 50   Temp 98.3 F (36.8 C) (Oral)   Ht 5' 8 (1.727 m)   Wt 173 lb (78.5 kg)   SpO2 96%   BMI 26.30 kg/m    Physical Exam Constitutional:      General: He is not in acute distress. HENT:     Head: Normocephalic and atraumatic.  Cardiovascular:     Rate and Rhythm: Normal rate.  Pulmonary:     Effort: Pulmonary effort is normal.     Breath sounds: Normal breath sounds. No wheezing.   Abdominal:     General: There is no distension.     Palpations: Abdomen is soft.     Tenderness: There is no guarding.  Musculoskeletal:     Cervical back: Neck supple.     Right lower leg: No edema.     Left lower leg: No edema.  Skin:    General: Skin is warm.  Neurological:     Mental Status: He is alert and oriented to person, place, and time.  Psychiatric:        Mood and Affect: Mood normal.     No results found for any visits on 04/24/24.     Assessment & Plan:  Essential (primary) hypertension Assessment & Plan: BP fluctuating at home, unclear etiology. Compliant with medications, no change in diet. No new illness.  BP today within goal. No chest pain, palpitations, lower leg edema, dyspnea. Physical exam at baseline. Patient asymptomatic.  Currently on Hydralazine  25 mg BID, Losartan  100 mg daily and Midodrine 200 mg daily per cardiologist Dr. Fernand. Check BMP.  - Monitor blood pressure daily, record readings. - Seek emergency care if BP >200/100 with symptoms. - Send weekly BP readings to me.  - Consult cardiologist Dr. Fernand if fluctuations persist.     Stage 3a chronic kidney disease [facility]) Assessment & Plan: CKD, s/p right kidney transplant in 05/10/2015. Established with transplant clinic at [facility]. Given  mild elevation in serum Potassium recommend repeat BMP, continue close f/u with [facility] nephrology department.   Orders: -     Basic metabolic panel with GFR  Hyperkalemia Assessment & Plan: Patient had labs done through [facility]/nephrology and was found to have serum Potassium of 5.2. Repeat BMP today.   Orders: -     Basic metabolic panel with GFR    Return in about 3 weeks (around 05/15/2024).  Luke Shade, MD

## 2024-04-24 NOTE — Assessment & Plan Note (Signed)
 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 Established with transplant clinic at [facility]. Given mild elevation in serum Potassium recommend repeat BMP, continue close f/u with [facility] nephrology department.

## 2024-04-25 ENCOUNTER — Ambulatory Visit: Payer: Self-pay

## 2024-04-25 DIAGNOSIS — E875 Hyperkalemia: Secondary | ICD-10-CM

## 2024-04-25 LAB — BASIC METABOLIC PANEL WITH GFR
BUN: 27 mg/dL — ABNORMAL HIGH (ref 6–23)
CO2: 25 meq/L (ref 19–32)
Calcium: 8.4 mg/dL (ref 8.4–10.5)
Chloride: 104 meq/L (ref 96–112)
Creatinine, Ser: 1.93 mg/dL — ABNORMAL HIGH (ref 0.40–1.50)
GFR: 34.82 mL/min — ABNORMAL LOW (ref >60.00)
Glucose, Bld: 202 mg/dL — ABNORMAL HIGH (ref 70–99)
Potassium: 5.7 meq/L — ABNORMAL HIGH (ref 3.5–5.1)
Sodium: 137 meq/L (ref 135–145)

## 2024-04-25 NOTE — Progress Notes (Signed)
 Please let the patient know his lab results from yesterday showed an elevated potassium level; however, the sample was noted to be hemolyzed, which likely caused a falsely elevated result. I have placed an order for a repeat potassium test to be performed at [facility] lab.  Please contact the patient and advise them to have the repeat potassium drawn at [facility] at his earliest convenience. This will help us  confirm that their potassium level is within the normal range.  Thank you,  Luke Shade, MD

## 2024-04-25 NOTE — Telephone Encounter (Signed)
 Noted.  Leonard Mascot, MD

## 2024-04-26 ENCOUNTER — Ambulatory Visit: Payer: Self-pay

## 2024-04-26 ENCOUNTER — Other Ambulatory Visit: Admission: RE | Admit: 2024-04-26 | Discharge: 2024-04-26 | Disposition: A | Discharge status: Home / Self Care

## 2024-04-26 DIAGNOSIS — E875 Hyperkalemia: Secondary | ICD-10-CM | POA: Diagnosis not present

## 2024-04-26 LAB — BASIC METABOLIC PANEL WITH GFR
Anion gap: 12 (ref 5–15)
BUN: 30 mg/dL — ABNORMAL HIGH (ref 8–23)
CO2: 24 mmol/L (ref 22–32)
Calcium: 8.3 mg/dL — ABNORMAL LOW (ref 8.9–10.3)
Chloride: 101 mmol/L (ref 98–111)
Creatinine, Ser: 1.57 mg/dL — ABNORMAL HIGH (ref 0.61–1.24)
GFR, Estimated: 47 mL/min — ABNORMAL LOW (ref >60)
Glucose, Bld: 97 mg/dL (ref 70–99)
Potassium: 4.5 mmol/L (ref 3.5–5.1)
Sodium: 137 mmol/L (ref 135–145)

## 2024-04-26 NOTE — Progress Notes (Signed)
 Updated patient on improved potassium, creatinine, GFR via phone call.   Luke Shade, MD

## 2024-04-26 NOTE — Addendum Note (Signed)
 Addended by: ALEX JON BROCKS on: 04/26/2024 08:29 AM   Modules accepted: Orders

## 2024-04-30 ENCOUNTER — Ambulatory Visit: Payer: Self-pay

## 2024-04-30 NOTE — Telephone Encounter (Signed)
 FYI Only or Action Required?: FYI only for provider.  Patient was last seen in primary care on 04/24/2024 by Abbey Bruckner, MD.  Called Nurse Triage reporting Cough.  Symptoms began several days ago.  Interventions attempted: OTC medications: Nyquil.  Symptoms are: gradually worsening.  Triage Disposition: See Physician Within 24 Hours  Patient/caregiver understands and will follow disposition?: Yes Copied from CRM #[id number]. Topic: Clinical - Red Word Triage >> Apr 30, 2024 11:07 AM Revonda D wrote: Red Word that prompted transfer to Nurse Triage: Wheezing   Pt is experiencing wheezing,cough,and has a cold. Pt would like to schedule an appt with provider today if possible.    ----------------------------------------------------------------------- From previous Reason for Contact - Scheduling: Patient/patient representative is calling to schedule an appointment. Refer to attachments for appointment information. Reason for Disposition  [1] Continuous (nonstop) coughing interferes with work or school AND [2] no improvement using cough treatment per Care Advice  Answer Assessment - Initial Assessment Questions Pt began having cold last week, then began feeling better but cough lingered. Now has cough interrupting daily activity and admits to wheezing in throat. Denies CP SOB or dyspnea. Taking nyquil.   1. ONSET: When did the cough begin?      Saturday 2. SEVERITY: How bad is the cough today?      continual 3. SPUTUM: Describe the color of your sputum (e.g., none, dry cough; clear, white, yellow, green)     Denies production 4. HEMOPTYSIS: Are you coughing up any blood? If Yes, ask: How much? (e.g., flecks, streaks, tablespoons, etc.)     denies 5. DIFFICULTY BREATHING: Are you having difficulty breathing? If Yes, ask: How bad is it? (e.g., mild, moderate, severe)      denies 6. FEVER: Do you have a fever? If Yes, ask: What is your temperature, how was it measured,  and when did it start?     denies 7. CARDIAC HISTORY: Do you have any history of heart disease? (e.g., heart attack, congestive heart failure)      CAD, Afib 8. LUNG HISTORY: Do you have any history of lung disease?  (e.g., pulmonary embolus, asthma, emphysema)     denies 9. PE RISK FACTORS: Do you have a history of blood clots? (or: recent major surgery, recent prolonged travel, bedridden)     denies 10. OTHER SYMPTOMS: Do you have any other symptoms? (e.g., runny nose, wheezing, chest pain)       Wheezing near throat, denies issues breathing.  12. TRAVEL: Have you traveled out of the country in the last month? (e.g., travel history, exposures)       denies  Protocols used: Cough - Acute Non-Productive-A-AH

## 2024-04-30 NOTE — Telephone Encounter (Signed)
 NOTED

## 2024-05-01 ENCOUNTER — Ambulatory Visit: Payer: Self-pay | Admitting: Family

## 2024-05-01 ENCOUNTER — Ambulatory Visit: Admitting: Family

## 2024-05-01 ENCOUNTER — Encounter: Payer: Self-pay | Admitting: Family

## 2024-05-01 ENCOUNTER — Telehealth: Payer: Self-pay | Admitting: Family

## 2024-05-01 ENCOUNTER — Ambulatory Visit (INDEPENDENT_AMBULATORY_CARE_PROVIDER_SITE_OTHER)
Admission: RE | Admit: 2024-05-01 | Discharge: 2024-05-01 | Disposition: A | Source: Ambulatory Visit | Attending: Family

## 2024-05-01 ENCOUNTER — Other Ambulatory Visit

## 2024-05-01 VITALS — BP 132/82 | HR 84 | Temp 99.1°F | Ht 68.0 in | Wt 166.0 lb

## 2024-05-01 DIAGNOSIS — N1831 Chronic kidney disease, stage 3a: Secondary | ICD-10-CM | POA: Diagnosis not present

## 2024-05-01 DIAGNOSIS — R062 Wheezing: Secondary | ICD-10-CM

## 2024-05-01 DIAGNOSIS — J22 Unspecified acute lower respiratory infection: Secondary | ICD-10-CM

## 2024-05-01 DIAGNOSIS — I5020 Unspecified systolic (congestive) heart failure: Secondary | ICD-10-CM

## 2024-05-01 DIAGNOSIS — I5022 Chronic systolic (congestive) heart failure: Secondary | ICD-10-CM

## 2024-05-01 DIAGNOSIS — Z20822 Contact with and (suspected) exposure to covid-19: Secondary | ICD-10-CM

## 2024-05-01 DIAGNOSIS — Z951 Presence of aortocoronary bypass graft: Secondary | ICD-10-CM | POA: Diagnosis not present

## 2024-05-01 DIAGNOSIS — R0989 Other specified symptoms and signs involving the circulatory and respiratory systems: Secondary | ICD-10-CM | POA: Diagnosis not present

## 2024-05-01 LAB — CBC WITH DIFFERENTIAL/PLATELET
Basophils Absolute: 0 K/uL (ref 0.0–0.1)
Basophils Relative: 0.4 % (ref 0.0–3.0)
Eosinophils Absolute: 0 K/uL (ref 0.0–0.7)
Eosinophils Relative: 0.5 % (ref 0.0–5.0)
HCT: 42 % (ref 39.0–52.0)
Hemoglobin: 13.7 g/dL (ref 13.0–17.0)
Lymphocytes Relative: 8.6 % — ABNORMAL LOW (ref 12.0–46.0)
Lymphs Abs: 0.8 K/uL (ref 0.7–4.0)
MCHC: 32.7 g/dL (ref 30.0–36.0)
MCV: 94.4 fl (ref 78.0–100.0)
Monocytes Absolute: 0.8 K/uL (ref 0.1–1.0)
Monocytes Relative: 9 % (ref 3.0–12.0)
Neutro Abs: 7.3 K/uL (ref 1.4–7.7)
Neutrophils Relative %: 81.5 % — ABNORMAL HIGH (ref 43.0–77.0)
Platelets: 172 K/uL (ref 150.0–400.0)
RBC: 4.44 Mil/uL (ref 4.22–5.81)
RDW: 13.8 % (ref 11.5–15.5)
WBC: 8.9 K/uL (ref 4.0–10.5)

## 2024-05-01 LAB — BASIC METABOLIC PANEL WITH GFR
BUN: 34 mg/dL — ABNORMAL HIGH (ref 6–23)
CO2: 28 meq/L (ref 19–32)
Calcium: 8.9 mg/dL (ref 8.4–10.5)
Chloride: 104 meq/L (ref 96–112)
Creatinine, Ser: 1.89 mg/dL — ABNORMAL HIGH (ref 0.40–1.50)
GFR: 35.7 mL/min — ABNORMAL LOW (ref >60.00)
Glucose, Bld: 120 mg/dL — ABNORMAL HIGH (ref 70–99)
Potassium: 4.3 meq/L (ref 3.5–5.1)
Sodium: 140 meq/L (ref 135–145)

## 2024-05-01 LAB — BRAIN NATRIURETIC PEPTIDE: Pro B Natriuretic peptide (BNP): 461 pg/mL — ABNORMAL HIGH (ref 0.0–100.0)

## 2024-05-01 LAB — POC COVID19 BINAXNOW: SARS Coronavirus 2 Ag: NEGATIVE

## 2024-05-01 MED ORDER — DOXYCYCLINE HYCLATE 100 MG PO TABS: 100.0000 mg | ORAL_TABLET | Freq: Two times a day (BID) | ORAL | 0 refills | Status: AC

## 2024-05-01 NOTE — Telephone Encounter (Signed)
 Good afternoon Dr. Abbey,   I saw your patient acutely today for c/o chest congestion and wheezing.  No pedal edema, no recent weight changes, denied worsening DOE.  I do see he has a h/o CHF.   Chest xray ordered, no pneumonia, pulmonary venous congestion seen without pleural effusion. I do see this in his past, last time he was seen in the hospital. He is taking furosemide  20 mg once daily. I did prescribe doxycycline for suspected lower respiratory infection however I wanted to make sure you did not want to do anything further, I was not sure if Dr. Deretha was his regular cardiologist or if that is his electrophysiologist.   I have also ordered a cbc BNP and bmp pending results.

## 2024-05-01 NOTE — Progress Notes (Signed)
 Established Patient Office Visit  Subjective:      CC:  Chief Complaint  Patient presents with   Acute Visit    Coughing, wheezing x1 week.    HPI: [name] is a 68 y.o. male presenting on 05/01/2024 for Acute Visit (Coughing, wheezing x1 week.) .  Discussed the use of AI scribe software for clinical note transcription with the patient, who gave verbal consent to proceed.  History of Present Illness [name] is a 68 year old male with atrial fibrillation and diabetes who presents with cough and wheezing.  He began experiencing symptoms on Friday night, approximately five days ago, with worsening over the weekend. The cough is severe and accompanied by wheezing, particularly in the throat, but there is no chest pain. He notes a slight improvement today.  He has been taking Nyquil over-the-counter, but it has not been very effective. He has not taken any antibiotics recently.  No fever, sore throat, ear pain, or chest tightness, but he confirms experiencing wheezing. He does not have a history of asthma or COPD. He denies being more short of breath than usual when walking.  His past medical history includes atrial fibrillation, diabetes, heart failure, and a kidney transplant. He has one functioning kidney and reports that it is working well. He is on insulin  therapy for diabetes.  He has a history of taking antibiotics such as clindamycin, cefdinir , and cefazolin  in the past, but not recently.         Social history:  Relevant past medical, surgical, family and social history reviewed and updated as indicated. Interim medical history since our last visit reviewed.  Allergies and medications reviewed and updated.  DATA REVIEWED: CHART IN EPIC     ROS: Negative unless specifically indicated above in HPI.    Current Outpatient Medications:    amiodarone  (PACERONE ) 200 MG tablet, Take 1 tablet (200 mg total) by mouth daily., Disp: 90 tablet,  Rfl: 1   ascorbic acid (VITAMIN C) 500 MG tablet, Take 500 mg by mouth daily., Disp: , Rfl:    aspirin  EC 81 MG EC tablet, Take 1 tablet (81 mg total) by mouth daily at 6 (six) AM., Disp:  , Rfl:    atorvastatin  (LIPITOR) 40 MG tablet, Take 40 mg by mouth at bedtime., Disp: , Rfl:    CELLCEPT  250 MG capsule, Take 500 mg by mouth 2 (two) times daily., Disp: , Rfl:    Cholecalciferol  (VITAMIN D) 50 MCG (2000 UT) CAPS, Take 2,000 Units by mouth daily., Disp: , Rfl:    Continuous Blood Gluc Sensor (FREESTYLE LIBRE 2 SENSOR) MISC, by Does not apply route., Disp: , Rfl:    Continuous Glucose Sensor (FREESTYLE LIBRE 2 PLUS SENSOR) MISC, 1 each by Other route every 14 (fourteen) days., Disp: , Rfl:    doxycycline (VIBRA-TABS) 100 MG tablet, Take 1 tablet (100 mg total) by mouth 2 (two) times daily for 10 days., Disp: 20 tablet, Rfl: 0   ELIQUIS  5 MG TABS tablet, Take 5 mg by mouth 2 (two) times daily. , Disp: , Rfl:    finasteride  (PROSCAR ) 5 MG tablet, Take 1 tablet (5 mg total) by mouth daily., Disp: 90 tablet, Rfl: 3   furosemide  (LASIX ) 20 MG tablet, Take 1 tablet (20 mg total) by mouth daily., Disp: 90 tablet, Rfl: 1   HUMALOG KWIKPEN 100 UNIT/ML KiwkPen, Inject 10-12 Units into the skin 3 (three) times daily. Inject 10 units daily at breakfast, 10 units at  lunch and up to 10 units or less at supper per SSI, Disp: , Rfl:    hydrALAZINE  (APRESOLINE ) 25 MG tablet, Take 1 tablet (25 mg total) by mouth 2 (two) times daily., Disp: 120 tablet, Rfl: 11   insulin  glargine (LANTUS  SOLOSTAR) 100 UNIT/ML Solostar Pen, Inject 30 Units into the skin at bedtime., Disp: , Rfl:    Insulin  Pen Needle 33G X 4 MM MISC, To use with insulin  pen 4 times per day. E 13.9, Disp: , Rfl:    linagliptin  (TRADJENTA ) 5 MG TABS tablet, Take 1 tablet (5 mg total) by mouth daily., Disp: 90 tablet, Rfl: 1   losartan  (COZAAR ) 100 MG tablet, Take 1 tablet (100 mg total) by mouth daily., Disp: 30 tablet, Rfl: 11   nitroGLYCERIN   (NITROSTAT ) 0.4 MG SL tablet, Place 1 tablet (0.4 mg total) under the tongue every 5 (five) minutes as needed for chest pain., Disp: 100 tablet, Rfl: 3   omeprazole (PRILOSEC) 20 MG capsule, Take 20 mg by mouth daily., Disp: , Rfl:    PROGRAF  1 MG capsule, Take 1 mg by mouth every morning., Disp: , Rfl:    tacrolimus  (PROGRAF ) 0.5 MG capsule, Take 0.5 mg by mouth at bedtime., Disp: , Rfl:    tamsulosin  (FLOMAX ) 0.4 MG CAPS capsule, Take 2 capsules (0.8 mg total) by mouth daily., Disp: 180 capsule, Rfl: 3 No current facility-administered medications for this visit.  Facility-Administered Medications Ordered in Other Visits:    sodium chloride  flush (NS) 0.9 % injection 3 mL, 3 mL, Intravenous, Q12H, Fernand Alter A, MD        Objective:        BP 132/82 (BP Location: Right Arm, Patient Position: Sitting, Cuff Size: Normal)   Pulse 84   Temp 99.1 F (37.3 C) (Temporal)   Ht 5' 8 (1.727 m)   Wt 166 lb (75.3 kg)   SpO2 96%   BMI 25.24 kg/m   Physical Exam CHEST: Abnormal breath sounds in the lungs.  Wt Readings from Last 3 Encounters:  05/01/24 166 lb (75.3 kg)  04/24/24 173 lb (78.5 kg)  03/06/24 167 lb (75.8 kg)    Physical Exam Vitals reviewed.  Constitutional:      General: He is not in acute distress.    Appearance: Normal appearance. He is obese. He is not ill-appearing, toxic-appearing or diaphoretic.  HENT:     Head: Normocephalic.     Right Ear: Tympanic membrane normal.     Left Ear: Tympanic membrane normal.     Nose: Nose normal.     Mouth/Throat:     Mouth: Mucous membranes are moist.  Eyes:     Pupils: Pupils are equal, round, and reactive to light.  Cardiovascular:     Rate and Rhythm: Normal rate and regular rhythm.  Pulmonary:     Effort: Pulmonary effort is normal.     Breath sounds: No decreased air movement. Examination of the right-upper field reveals wheezing and rhonchi. Examination of the left-upper field reveals wheezing. Examination of  the right-middle field reveals wheezing and rhonchi. Examination of the left-middle field reveals wheezing. Examination of the right-lower field reveals wheezing and rhonchi. Examination of the left-lower field reveals wheezing. Wheezing and rhonchi present.  Musculoskeletal:        General: Normal range of motion.     Cervical back: Normal range of motion.  Neurological:     General: No focal deficit present.     Mental Status: He is alert and  oriented to person, place, and time. Mental status is at baseline.  Psychiatric:        Mood and Affect: Mood normal.        Behavior: Behavior normal.        Thought Content: Thought content normal.        Judgment: Judgment normal.          Results   Assessment & Plan:   Assessment and Plan Assessment & Plan Acute cough and wheezing, lower respiratory infection  Acute onset of cough and wheezing since Friday night, approximately five days duration. Symptoms include wheezing and chest tightness without fever or sore throat. No history of asthma or COPD. Differential diagnosis includes possible pneumonia. Symptoms slightly improved today but still significant. - Order chest x-ray to rule out pneumonia, stat CXR reviewed with some pulmonary vascular congestion, pt is on furosemide  20 mg once daily and followed with cardiology for CHF no pleural effusion which is reassuring, no pedal edema. Denies worsening DOE or SOB or abnormal change in weight. Will order BNP CBC and BNP and will have pt reach out to cardiology for f/u. Advised pt to weight daily and report > 2 pounds in one day.  - Recommend Mucinex  (plain, without DM) for cough management - Advise against using Nyquil due to potential effects on heart rate and blood pressure -RX doxycycline 100 mg po bid x 10 days May consider adding on augmentin if positive pneumonia on xray  Advised close f/u with pcp   Atrial fibrillation Atrial fibrillation. - Advise against using Nyquil due to  potential to elevate heart rate and blood pressure, which could exacerbate atrial fibrillation.   Status post kidney transplant Status post kidney transplant with one functioning kidney. Kidney function is well-managed. Mucinex  is safe for use as it is excreted through the liver, not affecting kidney function.  Recording duration: 8 minutes    Return in about 1 week (around 05/08/2024) for with his PCP for wheezing.     Ginger Patrick, MSN, APRN, FNP-C [facility]

## 2024-05-04 ENCOUNTER — Other Ambulatory Visit: Payer: Self-pay

## 2024-05-04 ENCOUNTER — Emergency Department
Admission: EM | Admit: 2024-05-04 | Discharge: 2024-05-04 | Disposition: A | Discharge status: Home / Self Care | Attending: Emergency Medicine | Admitting: Emergency Medicine

## 2024-05-04 ENCOUNTER — Emergency Department

## 2024-05-04 DIAGNOSIS — Z940 Kidney transplant status: Secondary | ICD-10-CM | POA: Diagnosis not present

## 2024-05-04 DIAGNOSIS — I110 Hypertensive heart disease with heart failure: Secondary | ICD-10-CM | POA: Diagnosis not present

## 2024-05-04 DIAGNOSIS — Z7901 Long term (current) use of anticoagulants: Secondary | ICD-10-CM | POA: Diagnosis not present

## 2024-05-04 DIAGNOSIS — I509 Heart failure, unspecified: Secondary | ICD-10-CM | POA: Insufficient documentation

## 2024-05-04 DIAGNOSIS — E119 Type 2 diabetes mellitus without complications: Secondary | ICD-10-CM | POA: Insufficient documentation

## 2024-05-04 DIAGNOSIS — I480 Paroxysmal atrial fibrillation: Secondary | ICD-10-CM | POA: Insufficient documentation

## 2024-05-04 DIAGNOSIS — J209 Acute bronchitis, unspecified: Secondary | ICD-10-CM | POA: Diagnosis not present

## 2024-05-04 DIAGNOSIS — R0602 Shortness of breath: Secondary | ICD-10-CM | POA: Diagnosis not present

## 2024-05-04 DIAGNOSIS — R062 Wheezing: Secondary | ICD-10-CM | POA: Diagnosis not present

## 2024-05-04 DIAGNOSIS — I2510 Atherosclerotic heart disease of native coronary artery without angina pectoris: Secondary | ICD-10-CM | POA: Insufficient documentation

## 2024-05-04 DIAGNOSIS — R0789 Other chest pain: Secondary | ICD-10-CM | POA: Diagnosis not present

## 2024-05-04 DIAGNOSIS — R051 Acute cough: Secondary | ICD-10-CM

## 2024-05-04 LAB — CBC
HCT: 40.2 % (ref 39.0–52.0)
Hemoglobin: 13.1 g/dL (ref 13.0–17.0)
MCH: 30.9 pg (ref 26.0–34.0)
MCHC: 32.6 g/dL (ref 30.0–36.0)
MCV: 94.8 fL (ref 80.0–100.0)
Platelets: 179 K/uL (ref 150–400)
RBC: 4.24 MIL/uL (ref 4.22–5.81)
RDW: 13.1 % (ref 11.5–15.5)
WBC: 4.8 K/uL (ref 4.0–10.5)
nRBC: 0 % (ref 0.0–0.2)

## 2024-05-04 LAB — BASIC METABOLIC PANEL WITH GFR
Anion gap: 9 (ref 5–15)
BUN: 34 mg/dL — ABNORMAL HIGH (ref 8–23)
CO2: 22 mmol/L (ref 22–32)
Calcium: 8.2 mg/dL — ABNORMAL LOW (ref 8.9–10.3)
Chloride: 106 mmol/L (ref 98–111)
Creatinine, Ser: 1.74 mg/dL — ABNORMAL HIGH (ref 0.61–1.24)
GFR, Estimated: 42 mL/min — ABNORMAL LOW (ref >60)
Glucose, Bld: 237 mg/dL — ABNORMAL HIGH (ref 70–99)
Potassium: 4.1 mmol/L (ref 3.5–5.1)
Sodium: 137 mmol/L (ref 135–145)

## 2024-05-04 LAB — RESP PANEL BY RT-PCR (RSV, FLU A&B, COVID)  RVPGX2
Influenza A by PCR: NEGATIVE
Influenza B by PCR: NEGATIVE
Resp Syncytial Virus by PCR: NEGATIVE
SARS Coronavirus 2 by RT PCR: NEGATIVE

## 2024-05-04 MED ORDER — HYDROCOD POLI-CHLORPHE POLI ER 10-8 MG/5ML PO SUER
5.0000 mL | Freq: Once | ORAL | Status: AC
  Administered 2024-05-04: 5 mL via ORAL
  Filled 2024-05-04: qty 5

## 2024-05-04 MED ORDER — IPRATROPIUM-ALBUTEROL 0.5-2.5 (3) MG/3ML IN SOLN
3.0000 mL | Freq: Once | RESPIRATORY_TRACT | Status: AC
  Administered 2024-05-04: 3 mL via RESPIRATORY_TRACT
  Filled 2024-05-04: qty 3

## 2024-05-04 MED ORDER — ALBUTEROL SULFATE HFA 108 (90 BASE) MCG/ACT IN AERS: 2.0000 | INHALATION_SPRAY | Freq: Four times a day (QID) | RESPIRATORY_TRACT | 0 refills | Status: DC | PRN

## 2024-05-04 MED ORDER — GUAIFENESIN-CODEINE 100-10 MG/5ML PO SOLN: 5.0000 mL | Freq: Four times a day (QID) | ORAL | 0 refills | Status: DC | PRN

## 2024-05-04 NOTE — ED Triage Notes (Signed)
 Pt here c/o wheezing since Sat. Pt denies chest tightness. Pt denies CP. Pt endorses dry cough. Pt denies pain.

## 2024-05-04 NOTE — Discharge Instructions (Addendum)
 Please continue to take your antibiotics as prescribed by your doctor.  Please take your cough medication as needed, as prescribed.  Do not drink alcohol or drive while taking cough medication.  Use your inhaler every 6 hours as needed for shortness of breath.  As we discussed if you develop a fever or have worsening cough feel short of breath or develop chest pain please return to the emergency department or follow-up with your doctor on Monday.

## 2024-05-04 NOTE — ED Provider Notes (Signed)
 [facility] Provider Note    Event Date/Time   First MD Initiated Contact with Patient 05/04/24 1126     (approximate)  History   Chief Complaint: Shortness of Breath  HPI  [name] is a 68 y.o. male with a past medical history of CHF, CAD with stents, renal transplant in 2016, hypertension paroxysmal atrial fibrillation on apixaban , diabetes, presents to the emergency department for cough.  According to the patient for the past 5 days or so he has had wheezing and a cough.  He went to his doctor 2 days ago and was prescribed doxycycline for possible bronchitis.  Patient denies any fever.  Denies any nausea or vomiting.  Has continued to have some shortness of breath and cough so he came to the emergency department for evaluation.  Physical Exam   Triage Vital Signs: ED Triage Vitals  Encounter Vitals Group     BP 05/04/24 1058 (!) 134/40     Girls Systolic BP Percentile --      Girls Diastolic BP Percentile --      Boys Systolic BP Percentile --      Boys Diastolic BP Percentile --      Pulse Rate 05/04/24 1058 61     Resp 05/04/24 1058 18     Temp 05/04/24 1058 97.8 F (36.6 C)     Temp Source 05/04/24 1058 Oral     SpO2 05/04/24 1058 96 %     Weight 05/04/24 1059 169 lb (76.7 kg)     Height 05/04/24 1059 5' 8 (1.727 m)     Head Circumference --      Peak Flow --      Pain Score --      Pain Loc --      Pain Education --      Exclude from Growth Chart --     Most recent vital signs: Vitals:   05/04/24 1058  BP: (!) 134/40  Pulse: 61  Resp: 18  Temp: 97.8 F (36.6 C)  SpO2: 96%    General: Awake, no distress.  CV:  Good peripheral perfusion.  Regular rate and rhythm  Resp:  Normal effort.  Mild expiratory wheeze left greater than right.  No rales or rhonchi. Abd:  No distention.  Soft, nontender.  No rebound or guarding.  ED Results / Procedures / Treatments   EKG  EKG viewed and interpreted by myself shows sinus  bradycardia 56 bpm with a narrow QRS, normal axis, PR prolongation consistent with first-degree AV block otherwise normal intervals with no concerning ST changes.  RADIOLOGY  I have reviewed and interpreted the chest x-ray images.  No consolidation on my evaluation. Chest x-ray is negative for acute change.   MEDICATIONS ORDERED IN ED: Medications  ipratropium-albuterol (DUONEB) 0.5-2.5 (3) MG/3ML nebulizer solution 3 mL (3 mLs Nebulization Given 05/04/24 1134)     IMPRESSION / MDM / ASSESSMENT AND PLAN / ED COURSE  I reviewed the triage vital signs and the nursing notes.  Patient's presentation is most consistent with acute presentation with potential threat to life or bodily function.  Patient presents to the emergency department for mild wheezing cough ongoing over the last 5 days.  He has seen his doctor who prescribed the patient doxycycline which he started taking 2 days ago.  Patient denies any pain in the chest.  Patient's lab work has resulted showing a reassuring CBC reassuring chemistry.  Chest x-ray is negative for acute process.  Will obtain a COVID/flu/RSV swab.  Will treat with a breathing treatment/DuoNeb given the mild wheeze.  We will continue to closely monitor.  Patient satting well with reassuring vitals.  Overall appears well on exam.  Patient's CBC is normal, chemistry overall reassuring.  Respiratory panel negative.  Chest x-ray is clear with no signs of pneumonia.  Patient is on doxycycline which is good coverage for acute bronchitis.  We will discharge with an albuterol inhaler as well as a cough medication.  I discussed with the patient if symptoms do not improve over the next 24 to 48 hours he should follow-up with his doctor or return to the emergency department for further evaluation.  Patient is agreeable to this plan.  FINAL CLINICAL IMPRESSION(S) / ED DIAGNOSES   Cough Wheeze Acute bronchitis  Note:  This document was prepared using Dragon voice  recognition software and may include unintentional dictation errors.   Dorothyann Drivers, MD 05/04/24 1316

## 2024-05-04 NOTE — ED Notes (Signed)
 Pt states he has has covid testing twice this week and would like to defer.

## 2024-05-07 ENCOUNTER — Ambulatory Visit: Payer: Self-pay | Admitting: Family

## 2024-05-07 NOTE — Telephone Encounter (Signed)
 Copied from CRM [id number]. Topic: Appointments - Appointment Scheduling >> May 07, 2024  1:00 PM Franky GRADE wrote: Patient/patient representative is calling to schedule an appointment. Refer to attachments for appointment information.  Patient is returning a call he received to schedule an appointment. Patient is scheduled to be seen on 05/09/2024.

## 2024-05-07 NOTE — Telephone Encounter (Addendum)
 I spoke with T Dugal FNP and since pt has appt to see Dr Abbey 05/09/24 at 11:AM can tell pt Ginger thinks pt needs to see cardiology But can discuss this with Dr Abbey at the appt already scheduled with Dr Abbey to see if Dr Abbey thinks pt should see cardiology also. I spoke with Patty pts daughter (DPR signed) and she said she has already spoken with Dr Abbey since pt was seen at [facility] ED on 05/04/24 and Dr Abbey did recommend that pt should see cardiology. Patty said pt wants to wait to see card after pt sees Dr Abbey on 05/09/24. Patty notified as instructed by  ONEIDA Patrick FNP and Patty voiced understanding. She will give pt Furosemide (Lasix  40 mg daily for 3 days and pt has been weighing himself with no wt gain.pt has seen improvement in breathing since taking the doxycycline. Pt has Justlittle bit of wheezing now. Pt has slight prod cough with mostly white phlegm but some times has green phlegm also. no CP and no SOB. UC & ED precautions given and Patty voiced understanding and Patty appreciated the call. Sending note to ONEIDA Patrick FNP.     Patrick Ginger, FNP to Me  (Selected Message)    05/07/24  3:03 PM Result Note Laray can you please call, lindsay is done for the day and this is a bit more urgent. Thanks!    Labs with suggestion of congestive heart failure exacerbation.  Have him increase furosemide  to 40 mg once daily for three days.  Have him call his cardiologist to get an appt asap for CHF follow up.  How is he feeling, how is he breathing, any improvement with doxycycline?  There was a suggestion of an infection as well so doxycycline would be good for that.    Have pt weight daily  Weight > 2 pounds in one day needs to report back to PCP or cardiology.  If can not get in with cardiology asap have him make appt asap with his pcp, preferably in the next day or two.

## 2024-05-07 NOTE — Telephone Encounter (Signed)
 Yes, he will need a ED follow up appointment with me.   Thank you,  Luke Shade, MD

## 2024-05-09 ENCOUNTER — Ambulatory Visit (INDEPENDENT_AMBULATORY_CARE_PROVIDER_SITE_OTHER)

## 2024-05-09 VITALS — BP 140/60 | HR 60 | Temp 97.9°F | Ht 68.0 in | Wt 166.4 lb

## 2024-05-09 DIAGNOSIS — R062 Wheezing: Secondary | ICD-10-CM | POA: Diagnosis not present

## 2024-05-09 NOTE — Assessment & Plan Note (Addendum)
 D/D includes bronchitis, CHF exacerbation, pneumonia.  Chest x-ray 05/04/24: Mild stable hazy prominence of the central pulmonary vessels likely mild degree of chronic vascular congestion. Negative respiratory panel from 05/04/24. Continue Lasix  20 mg BID, reviewed weight 169 on 05/04/24 vs 166 today.  Finish Doxycycline 100 mg BID course for 10 days.  Continue Albuterol inhaler prn every 6-8 hourly for wheezing, cough.  Prn Guaifenesin -codeine cautiously. Do not use this if you don't have cough.  F/U with cardiologist Dr. Fernand on 05/10/24 confirmed, consider repeat echo if appropriate.  Repeat chest x-ray/non-con chest CT if symptoms persists after cardiology evaluation.

## 2024-05-09 NOTE — Progress Notes (Signed)
 Established Patient Office Visit   Subjective  Patient ID: [name], male    DOB: 06/30/1956  Age: 68 y.o. MRN: [id number]  Chief Complaint  Patient presents with   Bronchitis   Cough    He  has a past medical history of Acute exacerbation of CHF (congestive heart failure) (HCC) (12/16/2023), Acute on chronic congestive heart failure (HCC) (11/03/2023), Adenomatous polyp of colon (01/30/2024), AKI (acute kidney injury) (12/16/2023), Aortic atherosclerosis, Arthritis, BPH with LUTS s/p UroLift with Dr. Penne in November 2024 on finasteride , Flomax , and Sanctura   and sees [facility], BPH with LUTS, s/p urolift with Dr. Penne 06/2023, on Finasteride , Flomax , Sancture, sees, Carotid arterial disease, Chest pain (09/20/2017), Colon cancer screening (01/30/2024), Coronary artery disease, DDD (degenerative disc disease), lumbar, Dyspepsia (01/30/2024), Dyspnea, ESRD (end stage renal disease) (HCC), GERD (gastroesophageal reflux disease), History of blood transfusion, Hospitalization within last 30 days (06/03/2020), Hypertension, Long term current use of aspirin , Long term current use of immunosuppressive drug, Myocardial infarction (HCC) (2004), Myocardial infarction (HCC) (2005), On apixaban  therapy, Orthopnea (12/16/2023), PAF (paroxysmal atrial fibrillation) (HCC), Sepsis due to gram-negative UTI (HCC) (04/27/2020), Status post RIGHT kidney transplant (05/10/2015), Subconjunctival hemorrhage of right eye (12/15/2023), T2DM (type 2 diabetes mellitus) (HCC), Unstable angina (HCC), and Vitamin D deficiency.  HPI Discussed the use of AI scribe software for clinical note transcription with the patient, who gave verbal consent to proceed.  History of Present Illness AYDAN LEVITZ is a 68 year old male who presents for ED follow for evaluation of wheezing and cough.  Patient was seen on 05/01/24 at a primary care clinic for above symptoms. He had elevated BNP (461, also has a h/o CHF,  CKD), chest x-ray at the time showed vascular congestion.  Was recommended to take Lasix  20 mg twice a day which he has been.  He was started on Doxycycline BID for 10 days, he has 2 more days of prescription left.    He was evaluated at [facility] ED on 05/04/24 for shortness of breath. Repeat chest x-ray again showed vascular congestion. He was treated found to have normal respiratory panel. Was treated with duoneb in the ED. He was discharged on albuterol inhaler, Guaifensein-codine for cough.   Since ED visit patient reports: He experiences wheezing and intermittent cough, particularly when lying down. The cough is slightly improved with codeine, which he takes twice daily, but he still experiences wheezing and coughing at night. He uses an albuterol inhaler every six hours, but it does not provide relief.   During the review of symptoms, he denies having a sore throat but confirms wheezing. He is mindful of his fluid and salt intake due to his heart failure history.   ROS As per HPI    Objective:     BP (!) 140/60   Pulse 60   Temp 97.9 F (36.6 C) (Oral)   Ht 5' 8 (1.727 m)   Wt 166 lb 6.4 oz (75.5 kg)   SpO2 97%   BMI 25.30 kg/m      02/21/2024   11:01 AM 11/14/2023    9:10 AM  Depression screen PHQ 2/9  Decreased Interest 0 0  Down, Depressed, Hopeless 0 0  PHQ - 2 Score 0 0  Altered sleeping 0 0  Tired, decreased energy 0 0  Change in appetite 0 0  Feeling bad or failure about yourself  0 0  Trouble concentrating 0 0  Moving slowly or fidgety/restless 0 0  Suicidal thoughts 0  0  PHQ-9 Score 0 0  Difficult doing work/chores Not difficult at all       11/14/2023    9:10 AM  GAD 7 : Generalized Anxiety Score  Nervous, Anxious, on Edge 0  Control/stop worrying 0  Worry too much - different things 0  Trouble relaxing 0  Restless 0  Easily annoyed or irritable 0  Afraid - awful might happen 0  Total GAD 7 Score 0      02/21/2024   11:01 AM 11/14/2023    9:10 AM   Depression screen PHQ 2/9  Decreased Interest 0 0  Down, Depressed, Hopeless 0 0  PHQ - 2 Score 0 0  Altered sleeping 0 0  Tired, decreased energy 0 0  Change in appetite 0 0  Feeling bad or failure about yourself  0 0  Trouble concentrating 0 0  Moving slowly or fidgety/restless 0 0  Suicidal thoughts 0 0  PHQ-9 Score 0 0  Difficult doing work/chores Not difficult at all       11/14/2023    9:10 AM  GAD 7 : Generalized Anxiety Score  Nervous, Anxious, on Edge 0  Control/stop worrying 0  Worry too much - different things 0  Trouble relaxing 0  Restless 0  Easily annoyed or irritable 0  Afraid - awful might happen 0  Total GAD 7 Score 0   SDOH Screenings   Food Insecurity: No Food Insecurity (02/20/2024)  Housing: Low Risk  (02/20/2024)  Recent Concern: Housing - High Risk (12/22/2023)  Transportation Needs: No Transportation Needs (02/20/2024)  Utilities: Not At Risk (02/21/2024)  Alcohol Screen: Low Risk  (02/20/2024)  Depression (PHQ2-9): Low Risk  (02/21/2024)  Financial Resource Strain: Low Risk  (02/20/2024)  Physical Activity: Sufficiently Active (02/20/2024)  Social Connections: Moderately Integrated (02/20/2024)  Stress: No Stress Concern Present (02/20/2024)  Tobacco Use: Medium Risk (05/09/2024)  Health Literacy: Adequate Health Literacy (02/21/2024)     Physical Exam HENT:     Head: Normocephalic and atraumatic.     Mouth/Throat:     Mouth: Mucous membranes are moist.  Cardiovascular:     Rate and Rhythm: Normal rate.  Pulmonary:     Breath sounds: Wheezing (bibasilar wheezing noted on exam) present.  Abdominal:     General: Bowel sounds are normal.     Palpations: Abdomen is soft.  Musculoskeletal:     Cervical back: No rigidity.     Right lower leg: No edema.     Left lower leg: No edema.  Neurological:     Mental Status: He is alert and oriented to person, place, and time.  Psychiatric:        Mood and Affect: Mood normal.        No results  found for any visits on 05/09/24.  The ASCVD Risk score (Arnett DK, et al., 2019) failed to calculate for the following reasons:   Risk score cannot be calculated because patient has a medical history suggesting prior/existing ASCVD     Assessment & Plan:  Bilateral wheezing Assessment & Plan: D/D includes bronchitis, CHF exacerbation, pneumonia.  Chest x-ray 05/04/24: Mild stable hazy prominence of the central pulmonary vessels likely mild degree of chronic vascular congestion. Negative respiratory panel from 05/04/24. Continue Lasix  20 mg BID, reviewed weight 169 on 05/04/24 vs 166 today.  Finish Doxycycline 100 mg BID course for 10 days.  Continue Albuterol inhaler prn every 6-8 hourly for wheezing, cough.  Prn Guaifenesin -codeine cautiously. Do not use this if  you don't have cough.  F/U with cardiologist Dr. Fernand on 05/10/24 confirmed, consider repeat echo if appropriate.  Repeat chest x-ray/non-con chest CT if symptoms persists after cardiology evaluation.        Bilateral wheezing Assessment & Plan: D/D includes bronchitis, CHF exacerbation, pneumonia.  Chest x-ray 05/04/24: Mild stable hazy prominence of the central pulmonary vessels likely mild degree of chronic vascular congestion. Negative respiratory panel from 05/04/24. Continue Lasix  20 mg BID, reviewed weight 169 on 05/04/24 vs 166 today.  Finish Doxycycline 100 mg BID course for 10 days.  Continue Albuterol inhaler prn every 6-8 hourly for wheezing, cough.  Prn Guaifenesin -codeine cautiously. Do not use this if you don't have cough.  F/U with cardiologist Dr. Fernand on 05/10/24 confirmed, consider repeat echo if appropriate.  Repeat chest x-ray/non-con chest CT if symptoms persists after cardiology evaluation.        Return if symptoms worsen or fail to improve.   Luke Shade, MD

## 2024-05-09 NOTE — Progress Notes (Signed)
 noted

## 2024-05-09 NOTE — Patient Instructions (Addendum)
-   You have appointment with Dr. Fernand tomorrow 05/10/24 at 9 AM. Be there by 8:50 AM.  - Continue to use albuterol inhaler 2 puffs daily every 6-8 hourly as needed for wheezing. Finish Doxycycline antibiotic as prescribed.

## 2024-05-10 ENCOUNTER — Encounter: Payer: Self-pay | Admitting: Cardiovascular Disease

## 2024-05-11 ENCOUNTER — Encounter: Payer: Self-pay | Admitting: Cardiovascular Disease

## 2024-05-11 ENCOUNTER — Ambulatory Visit: Admitting: Cardiovascular Disease

## 2024-05-11 VITALS — BP 118/62 | HR 50 | Ht 68.0 in | Wt 166.0 lb

## 2024-05-11 DIAGNOSIS — I6523 Occlusion and stenosis of bilateral carotid arteries: Secondary | ICD-10-CM

## 2024-05-11 DIAGNOSIS — I10 Essential (primary) hypertension: Secondary | ICD-10-CM

## 2024-05-11 DIAGNOSIS — I700 Atherosclerosis of aorta: Secondary | ICD-10-CM

## 2024-05-11 DIAGNOSIS — I25708 Atherosclerosis of coronary artery bypass graft(s), unspecified, with other forms of angina pectoris: Secondary | ICD-10-CM | POA: Diagnosis not present

## 2024-05-11 DIAGNOSIS — R0989 Other specified symptoms and signs involving the circulatory and respiratory systems: Secondary | ICD-10-CM

## 2024-05-11 DIAGNOSIS — I480 Paroxysmal atrial fibrillation: Secondary | ICD-10-CM

## 2024-05-11 DIAGNOSIS — I5020 Unspecified systolic (congestive) heart failure: Secondary | ICD-10-CM | POA: Diagnosis not present

## 2024-05-11 LAB — CBC WITH DIFFERENTIAL/PLATELET
Basophils Absolute: 0.1 x10E3/uL (ref 0.0–0.2)
Basos: 1 %
EOS (ABSOLUTE): 0.1 x10E3/uL (ref 0.0–0.4)
Eos: 1 %
Hematocrit: 42.8 % (ref 37.5–51.0)
Hemoglobin: 13.9 g/dL (ref 13.0–17.7)
Immature Grans (Abs): 0 x10E3/uL (ref 0.0–0.1)
Immature Granulocytes: 0 %
Lymphocytes Absolute: 0.9 x10E3/uL (ref 0.7–3.1)
Lymphs: 12 %
MCH: 31.4 pg (ref 26.6–33.0)
MCHC: 32.5 g/dL (ref 31.5–35.7)
MCV: 97 fL (ref 79–97)
Monocytes Absolute: 0.6 x10E3/uL (ref 0.1–0.9)
Monocytes: 8 %
Neutrophils Absolute: 5.8 x10E3/uL (ref 1.4–7.0)
Neutrophils: 77 %
Platelets: 202 x10E3/uL (ref 150–450)
RBC: 4.43 x10E6/uL (ref 4.14–5.80)
RDW: 12.7 % (ref 11.6–15.4)
WBC: 7.6 x10E3/uL (ref 3.4–10.8)

## 2024-05-11 MED ORDER — TORSEMIDE 10 MG PO TABS: 10.0000 mg | ORAL_TABLET | Freq: Two times a day (BID) | ORAL | 11 refills | Status: AC

## 2024-05-11 MED ORDER — AMOXICILLIN-POT CLAVULANATE 875-125 MG PO TABS: 1.0000 | ORAL_TABLET | Freq: Two times a day (BID) | ORAL | 0 refills | Status: DC

## 2024-05-11 NOTE — Progress Notes (Addendum)
 Cardiology Office Note   Date:  05/11/2024   ID:  Baruc, Tugwell [birth year], MRN [id number]  PCP:  Abbey Bruckner, MD  Cardiologist:  Denyse Bathe, MD      History of Present Illness: [name] is a 67 y.o. male who presents for  Chief Complaint  Patient presents with   Follow-up    Follow up    Has dry cough, wheezing and SOB. Asked by primary to evaluate. COVID was negative.      Past Medical History:  Diagnosis Date   Acute exacerbation of CHF (congestive heart failure) (HCC) 12/16/2023   Acute on chronic congestive heart failure (HCC) 11/03/2023   Adenomatous polyp of colon 01/30/2024   AKI (acute kidney injury) 12/16/2023   Aortic atherosclerosis    Arthritis    BPH with LUTS s/p UroLift with Dr. Penne in November 2024 on finasteride , Flomax , and Sanctura   and sees [facility]    BPH with LUTS, s/p urolift with Dr. Penne 06/2023, on Finasteride , Flomax , Sancture, sees    Carotid arterial disease    a.) s/p LEFT CEA on 09/06/2019   Chest pain 09/20/2017   Colon cancer screening 01/30/2024   Coronary artery disease    a.) MI with stents x 2 in 2004; b.) MI with stent x 1 2005; c.) s/p CABG x 2 06/02/2006 (LIMA-LAD, SVG-PDA); d.) LHC/PCI 08/25/2007: 80% oD1 (2.5 x 12 mm Xience V DES)   DDD (degenerative disc disease), lumbar    Dyspepsia 01/30/2024   Dyspnea    ESRD (end stage renal disease) (HCC)    a.) s/p RIGHT renal 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/2015   GERD (gastroesophageal reflux disease)    History of blood transfusion    Hospitalization within last 30 days 06/03/2020   Hypertension    Long term current use of aspirin     Long term current use of immunosuppressive drug    a.) mycophenolate  + tacrolimus    Myocardial infarction [facility]) 2004   a.) details unclear; stents x 2 (unknown type/location)   Myocardial infarction (HCC) 2005   a.) details unclear; stent x 1 (unknown type/location)   On apixaban  therapy    Orthopnea 12/16/2023   PAF  (paroxysmal atrial fibrillation) (HCC)    a.) CHA2DS2-VASc = 5 (age, HTN, vascular disease history/MI, T2DM) as of 06/10/2023; b.) cardiac rate/rhythm maintained intrinsically without pharmacological intervention; chronically anticoagulated using apixaban    Sepsis due to gram-negative UTI (HCC) 04/27/2020   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   From DM and HTN, f/u with [facility] every 6 months   Subconjunctival hemorrhage of right eye 12/15/2023   T2DM (type 2 diabetes mellitus) (HCC)    Unstable angina (HCC)    Vitamin D deficiency      Past Surgical History:  Procedure Laterality Date   CATARACT EXTRACTION Bilateral    CATARACT EXTRACTION W/PHACO Right 02/03/2017   Procedure: CATARACT EXTRACTION PHACO AND INTRAOCULAR LENS PLACEMENT (IOC);  Surgeon: Mittie Gaskin, MD;  Location: [facility] ORS;  Service: Ophthalmology;  Laterality: Right;  US  00:35.8 AP% 12.8 CDE 4.57 Fluid lot # [id number]   COLONOSCOPY N/A 01/30/2024   Procedure: COLONOSCOPY;  Surgeon: Therisa Bi, MD;  Location: [facility] ENDOSCOPY;  Service: Gastroenterology;  Laterality: N/A;  IDDM   COLONOSCOPY WITH PROPOFOL  N/A 12/30/2021   Procedure: COLONOSCOPY WITH PROPOFOL ;  Surgeon: Therisa Bi, MD;  Location: [facility] ENDOSCOPY;  Service: Gastroenterology;  Laterality: N/A;   CORONARY ANGIOPLASTY WITH STENT PLACEMENT Left 2004   stents x 2  CORONARY ANGIOPLASTY WITH STENT PLACEMENT Left 2005   stent x 1   CORONARY ANGIOPLASTY WITH STENT PLACEMENT Left 08/25/2007   Procedure: CORONARY ANGIOPLASTY WITH STENT PLACEMENT; Location: [facility]; Surgeon: Valinda Cage, MD   CORONARY ARTERY BYPASS GRAFT N/A 06/08/2006   Procedure: CORONARY ARTERY BYPASS GRAFT; Location: [facility]; Surgeon: Maude Sharps, MD   CYSTOSCOPY WITH INSERTION OF UROLIFT N/A 06/13/2023   Procedure: CYSTOSCOPY WITH INSERTION OF UROLIFT;  Surgeon: Penne Knee, MD;  Location: [facility] ORS;  Service: Urology;  Laterality: N/A;   ENDARTERECTOMY Left 09/06/2019    Procedure: ENDARTERECTOMY CAROTID;  Surgeon: Marea Selinda RAMAN, MD;  Location: [facility] ORS;  Service: Vascular;  Laterality: Left;   ESOPHAGOGASTRODUODENOSCOPY N/A 01/30/2024   Procedure: EGD (ESOPHAGOGASTRODUODENOSCOPY);  Surgeon: Therisa Bi, MD;  Location: [facility] ENDOSCOPY;  Service: Gastroenterology;  Laterality: N/A;   HIP FRACTURE SURGERY     KIDNEY TRANSPLANT Right 05/10/2015   LEFT HEART CATH AND CORONARY ANGIOGRAPHY Left 05/20/2006   Procedure: LEFT HEART CATH AND CORONARY ANGIOGRAPHY; Location: [facility]; Surgeon: Denyse Bathe, MD   LEFT HEART CATH AND CORONARY ANGIOGRAPHY Left 11/08/2023   Procedure: LEFT HEART CATH AND CORONARY ANGIOGRAPHY with possible intervention;  Surgeon: Bathe Denyse LABOR, MD;  Location: [facility] INVASIVE CV LAB;  Service: Cardiovascular;  Laterality: Left;   LEFT HEART CATH AND CORS/GRAFTS ANGIOGRAPHY Left 08/10/2007   Procedure: LEFT HEART CATH AND CORS/GRAFTS ANGIOGRAPHY; Location: [facility]; Surgeon: Denyse Bathe, MD   LEFT HEART CATH AND CORS/GRAFTS ANGIOGRAPHY Left 05/04/2011   Procedure: LEFT HEART CATH AND CORS/GRAFTS ANGIOGRAPHY; Location: [facility]; Surgeon: Denyse Bathe, MD   POLYPECTOMY  01/30/2024   Procedure: POLYPECTOMY, INTESTINE;  Surgeon: Therisa Bi, MD;  Location: [facility] ENDOSCOPY;  Service: Gastroenterology;;     Current Outpatient Medications  Medication Sig Dispense Refill   amoxicillin-clavulanate (AUGMENTIN) 875-125 MG tablet Take 1 tablet by mouth 2 (two) times daily. 20 tablet 0   torsemide (DEMADEX) 10 MG tablet Take 1 tablet (10 mg total) by mouth 2 (two) times daily. 60 tablet 11   albuterol (VENTOLIN HFA) 108 (90 Base) MCG/ACT inhaler Inhale 2 puffs into the lungs every 6 (six) hours as needed for wheezing or shortness of breath. 8 g 0   amiodarone  (PACERONE ) 200 MG tablet Take 1 tablet (200 mg total) by mouth daily. 90 tablet 1   ascorbic acid (VITAMIN C) 500 MG tablet Take 500 mg by mouth daily.     aspirin  EC 81 MG EC tablet Take 1 tablet (81 mg total) by  mouth daily at 6 (six) AM.     atorvastatin  (LIPITOR) 40 MG tablet Take 40 mg by mouth at bedtime.     CELLCEPT  250 MG capsule Take 500 mg by mouth 2 (two) times daily.     Cholecalciferol  (VITAMIN D) 50 MCG (2000 UT) CAPS Take 2,000 Units by mouth daily.     Continuous Blood Gluc Sensor (FREESTYLE LIBRE 2 SENSOR) MISC by Does not apply route.     Continuous Glucose Sensor (FREESTYLE LIBRE 2 PLUS SENSOR) MISC 1 each by Other route every 14 (fourteen) days.     doxycycline (VIBRA-TABS) 100 MG tablet Take 1 tablet (100 mg total) by mouth 2 (two) times daily for 10 days. 20 tablet 0   ELIQUIS  5 MG TABS tablet Take 5 mg by mouth 2 (two) times daily.      finasteride  (PROSCAR ) 5 MG tablet Take 1 tablet (5 mg total) by mouth daily. 90 tablet 3   guaiFENesin -codeine 100-10 MG/5ML syrup Take 5 mLs by mouth every  6 (six) hours as needed. 120 mL 0   HUMALOG KWIKPEN 100 UNIT/ML KiwkPen Inject 10-12 Units into the skin 3 (three) times daily. Inject 10 units daily at breakfast, 10 units at lunch and up to 10 units or less at supper per SSI     hydrALAZINE  (APRESOLINE ) 25 MG tablet Take 1 tablet (25 mg total) by mouth 2 (two) times daily. 120 tablet 11   insulin  glargine (LANTUS  SOLOSTAR) 100 UNIT/ML Solostar Pen Inject 30 Units into the skin at bedtime.     Insulin  Pen Needle 33G X 4 MM MISC To use with insulin  pen 4 times per day. E 13.9     linagliptin  (TRADJENTA ) 5 MG TABS tablet Take 1 tablet (5 mg total) by mouth daily. 90 tablet 1   losartan  (COZAAR ) 100 MG tablet Take 1 tablet (100 mg total) by mouth daily. 30 tablet 11   nitroGLYCERIN  (NITROSTAT ) 0.4 MG SL tablet Place 1 tablet (0.4 mg total) under the tongue every 5 (five) minutes as needed for chest pain. 100 tablet 3   omeprazole (PRILOSEC) 20 MG capsule Take 20 mg by mouth daily.     PROGRAF  1 MG capsule Take 1 mg by mouth every morning.     tacrolimus  (PROGRAF ) 0.5 MG capsule Take 0.5 mg by mouth at bedtime.     tamsulosin  (FLOMAX ) 0.4 MG CAPS  capsule Take 2 capsules (0.8 mg total) by mouth daily. 180 capsule 3   No current facility-administered medications for this visit.   Facility-Administered Medications Ordered in Other Visits  Medication Dose Route Frequency Provider Last Rate Last Admin   sodium chloride  flush (NS) 0.9 % injection 3 mL  3 mL Intravenous Q12H Fernand Alter A, MD        Allergies:   Oxybutynin  and Trospium  chloride    Social History:   reports that he has quit smoking. He has never used smokeless tobacco. He reports that he does not drink alcohol and does not use drugs.   Family History:  family history includes Drug abuse in his brother and sister; Osteoporosis in his mother.    ROS:     Review of Systems  Constitutional: Negative.   HENT: Negative.    Eyes: Negative.   Respiratory: Negative.    Gastrointestinal: Negative.   Genitourinary: Negative.   Musculoskeletal: Negative.   Skin: Negative.   Neurological: Negative.   Endo/Heme/Allergies: Negative.   Psychiatric/Behavioral: Negative.    All other systems reviewed and are negative.     All other systems are reviewed and negative.    PHYSICAL EXAM: VS:  BP 118/62   Pulse (!) 50   Ht 5' 8 (1.727 m)   Wt 166 lb (75.3 kg)   SpO2 92%   BMI 25.24 kg/m  , BMI Body mass index is 25.24 kg/m. Last weight:  Wt Readings from Last 3 Encounters:  05/11/24 166 lb (75.3 kg)  05/09/24 166 lb 6.4 oz (75.5 kg)  05/04/24 169 lb 1.5 oz (76.7 kg)     Physical Exam Vitals reviewed.  Constitutional:      Appearance: Normal appearance. He is normal weight.  HENT:     Head: Normocephalic.     Nose: Nose normal.     Mouth/Throat:     Mouth: Mucous membranes are moist.  Eyes:     Pupils: Pupils are equal, round, and reactive to light.  Cardiovascular:     Rate and Rhythm: Normal rate and regular rhythm.     Pulses: Normal  pulses.     Heart sounds: Normal heart sounds.  Pulmonary:     Effort: Pulmonary effort is normal.  Abdominal:      General: Abdomen is flat. Bowel sounds are normal.  Musculoskeletal:        General: Normal range of motion.     Cervical back: Normal range of motion.  Skin:    General: Skin is warm.  Neurological:     General: No focal deficit present.     Mental Status: He is alert.  Psychiatric:        Mood and Affect: Mood normal.       EKG:   Recent Labs: 12/16/2023: B Natriuretic Peptide 684.4; TSH 2.987 12/18/2023: ALT 18; Magnesium  2.0 05/01/2024: Pro B Natriuretic peptide (BNP) 461.0 05/04/2024: BUN 34; Creatinine, Ser 1.74; Hemoglobin 13.1; Platelets 179; Potassium 4.1; Sodium 137    Lipid Panel    Component Value Date/Time   CHOL  10/20/2010 0400    94        ATP III CLASSIFICATION:  <200     mg/dL   Desirable  799-760  mg/dL   Borderline High  >=759    mg/dL   High          TRIG 845 (H) 10/23/2010 0600      Other studies Reviewed: Additional studies/ records that were reviewed today include:  Review of the above records demonstrates:       No data to display            ASSESSMENT AND PLAN:    ICD-10-CM   1. Aortic atherosclerosis  I70.0 torsemide (DEMADEX) 10 MG tablet    PCV ECHOCARDIOGRAM COMPLETE    amoxicillin-clavulanate (AUGMENTIN) 875-125 MG tablet    Comprehensive metabolic panel    CBC with Differential/Platelet    2. Bilateral carotid artery stenosis  I65.23 torsemide (DEMADEX) 10 MG tablet    PCV ECHOCARDIOGRAM COMPLETE    amoxicillin-clavulanate (AUGMENTIN) 875-125 MG tablet    Comprehensive metabolic panel    CBC with Differential/Platelet    3. Coronary artery disease involving coronary bypass graft of native heart with other forms of angina pectoris  I25.708 torsemide (DEMADEX) 10 MG tablet    PCV ECHOCARDIOGRAM COMPLETE    amoxicillin-clavulanate (AUGMENTIN) 875-125 MG tablet    Comprehensive metabolic panel    CBC with Differential/Platelet    4. Essential (primary) hypertension  I10 torsemide (DEMADEX) 10 MG tablet    PCV  ECHOCARDIOGRAM COMPLETE    amoxicillin-clavulanate (AUGMENTIN) 875-125 MG tablet    Comprehensive metabolic panel    CBC with Differential/Platelet    5. Heart failure with mildly reduced ejection fraction (HFmrEF) (HCC)  I50.20 torsemide (DEMADEX) 10 MG tablet    PCV ECHOCARDIOGRAM COMPLETE    amoxicillin-clavulanate (AUGMENTIN) 875-125 MG tablet    Comprehensive metabolic panel    CBC with Differential/Platelet   LVEF45% 3/25, will repeat echo. Also do cbc and metc.    6. Paroxysmal atrial fibrillation (HCC)  I48.0 torsemide (DEMADEX) 10 MG tablet    PCV ECHOCARDIOGRAM COMPLETE    amoxicillin-clavulanate (AUGMENTIN) 875-125 MG tablet    Comprehensive metabolic panel    CBC with Differential/Platelet    7. Pulmonary vascular congestion  R09.89 torsemide (DEMADEX) 10 MG tablet    PCV ECHOCARDIOGRAM COMPLETE    amoxicillin-clavulanate (AUGMENTIN) 875-125 MG tablet    Comprehensive metabolic panel    CBC with Differential/Platelet   change lasix  to toresemide 10 bid.. While being examined, started coughing and had coarse crepitations, start  augemtin as no improvement with doxycycline       Problem List Items Addressed This Visit       Cardiovascular and Mediastinum   Essential (primary) hypertension (Chronic)   Relevant Medications   torsemide (DEMADEX) 10 MG tablet   amoxicillin-clavulanate (AUGMENTIN) 875-125 MG tablet   Other Relevant Orders   PCV ECHOCARDIOGRAM COMPLETE   Comprehensive metabolic panel   CBC with Differential/Platelet   Coronary artery disease (Chronic)   Relevant Medications   torsemide (DEMADEX) 10 MG tablet   amoxicillin-clavulanate (AUGMENTIN) 875-125 MG tablet   Other Relevant Orders   PCV ECHOCARDIOGRAM COMPLETE   Comprehensive metabolic panel   CBC with Differential/Platelet   Paroxysmal atrial fibrillation (HCC) (Chronic)   Relevant Medications   torsemide (DEMADEX) 10 MG tablet   amoxicillin-clavulanate (AUGMENTIN) 875-125 MG tablet    Other Relevant Orders   PCV ECHOCARDIOGRAM COMPLETE   Comprehensive metabolic panel   CBC with Differential/Platelet   Heart failure with mildly reduced ejection fraction (HFmrEF) (HCC) (Chronic)   Relevant Medications   torsemide (DEMADEX) 10 MG tablet   amoxicillin-clavulanate (AUGMENTIN) 875-125 MG tablet   Other Relevant Orders   PCV ECHOCARDIOGRAM COMPLETE   Comprehensive metabolic panel   CBC with Differential/Platelet   Carotid artery stenosis   Relevant Medications   torsemide (DEMADEX) 10 MG tablet   amoxicillin-clavulanate (AUGMENTIN) 875-125 MG tablet   Other Relevant Orders   PCV ECHOCARDIOGRAM COMPLETE   Comprehensive metabolic panel   CBC with Differential/Platelet   Aortic atherosclerosis - Primary   Relevant Medications   torsemide (DEMADEX) 10 MG tablet   amoxicillin-clavulanate (AUGMENTIN) 875-125 MG tablet   Other Relevant Orders   PCV ECHOCARDIOGRAM COMPLETE   Comprehensive metabolic panel   CBC with Differential/Platelet     Other   Pulmonary vascular congestion   Relevant Medications   torsemide (DEMADEX) 10 MG tablet   amoxicillin-clavulanate (AUGMENTIN) 875-125 MG tablet   Other Relevant Orders   PCV ECHOCARDIOGRAM COMPLETE   Comprehensive metabolic panel   CBC with Differential/Platelet       Disposition:   Return in about 2 weeks (around 05/25/2024) for echo and f/u.    Total time spent: 35 minutes  Signed,  Denyse Bathe, MD  05/11/2024 10:46 AM    [facility]

## 2024-05-11 NOTE — Addendum Note (Signed)
 Addended by: FERNAND ALTER A on: 05/11/2024 10:46 AM   Modules accepted: Orders

## 2024-05-12 LAB — COMPREHENSIVE METABOLIC PANEL WITH GFR
ALT: 30 IU/L (ref 0–44)
AST: 27 IU/L (ref 0–40)
Albumin: 4 g/dL (ref 3.9–4.9)
Alkaline Phosphatase: 54 IU/L (ref 47–123)
BUN/Creatinine Ratio: 23 (ref 10–24)
BUN: 32 mg/dL — ABNORMAL HIGH (ref 8–27)
Bilirubin Total: 0.7 mg/dL (ref 0.0–1.2)
CO2: 21 mmol/L (ref 20–29)
Calcium: 9 mg/dL (ref 8.6–10.2)
Chloride: 102 mmol/L (ref 96–106)
Creatinine, Ser: 1.39 mg/dL — ABNORMAL HIGH (ref 0.76–1.27)
Globulin, Total: 2.7 g/dL (ref 1.5–4.5)
Glucose: 169 mg/dL — ABNORMAL HIGH (ref 70–99)
Potassium: 4.6 mmol/L (ref 3.5–5.2)
Sodium: 139 mmol/L (ref 134–144)
Total Protein: 6.7 g/dL (ref 6.0–8.5)
eGFR: 55 mL/min/1.73 — ABNORMAL LOW (ref >59)

## 2024-05-15 ENCOUNTER — Ambulatory Visit

## 2024-05-21 ENCOUNTER — Emergency Department
Admission: EM | Admit: 2024-05-21 | Discharge: 2024-05-21 | Disposition: A | Discharge status: Home / Self Care | Attending: Emergency Medicine | Admitting: Emergency Medicine

## 2024-05-21 ENCOUNTER — Other Ambulatory Visit: Payer: Self-pay

## 2024-05-21 DIAGNOSIS — I10 Essential (primary) hypertension: Secondary | ICD-10-CM | POA: Diagnosis not present

## 2024-05-21 DIAGNOSIS — R339 Retention of urine, unspecified: Secondary | ICD-10-CM | POA: Insufficient documentation

## 2024-05-21 DIAGNOSIS — R300 Dysuria: Secondary | ICD-10-CM | POA: Diagnosis not present

## 2024-05-21 DIAGNOSIS — Z940 Kidney transplant status: Secondary | ICD-10-CM | POA: Diagnosis not present

## 2024-05-21 DIAGNOSIS — R1033 Periumbilical pain: Secondary | ICD-10-CM | POA: Insufficient documentation

## 2024-05-21 DIAGNOSIS — E119 Type 2 diabetes mellitus without complications: Secondary | ICD-10-CM | POA: Diagnosis not present

## 2024-05-21 DIAGNOSIS — N183 Chronic kidney disease, stage 3 unspecified: Secondary | ICD-10-CM

## 2024-05-21 DIAGNOSIS — R944 Abnormal results of kidney function studies: Secondary | ICD-10-CM | POA: Diagnosis not present

## 2024-05-21 DIAGNOSIS — I129 Hypertensive chronic kidney disease with stage 1 through stage 4 chronic kidney disease, or unspecified chronic kidney disease: Secondary | ICD-10-CM | POA: Diagnosis not present

## 2024-05-21 DIAGNOSIS — N189 Chronic kidney disease, unspecified: Secondary | ICD-10-CM | POA: Diagnosis not present

## 2024-05-21 LAB — COMPREHENSIVE METABOLIC PANEL WITH GFR
ALT: 37 U/L (ref 0–44)
AST: 36 U/L (ref 15–41)
Albumin: 3.9 g/dL (ref 3.5–5.0)
Alkaline Phosphatase: 41 U/L (ref 38–126)
Anion gap: 12 (ref 5–15)
BUN: 71 mg/dL — ABNORMAL HIGH (ref 8–23)
CO2: 23 mmol/L (ref 22–32)
Calcium: 8.8 mg/dL — ABNORMAL LOW (ref 8.9–10.3)
Chloride: 100 mmol/L (ref 98–111)
Creatinine, Ser: 2.13 mg/dL — ABNORMAL HIGH (ref 0.61–1.24)
GFR, Estimated: 33 mL/min — ABNORMAL LOW (ref >60)
Glucose, Bld: 237 mg/dL — ABNORMAL HIGH (ref 70–99)
Potassium: 4 mmol/L (ref 3.5–5.1)
Sodium: 135 mmol/L (ref 135–145)
Total Bilirubin: 1.2 mg/dL (ref 0.0–1.2)
Total Protein: 7.3 g/dL (ref 6.5–8.1)

## 2024-05-21 LAB — CBC WITH DIFFERENTIAL/PLATELET
Abs Immature Granulocytes: 0.04 K/uL (ref 0.00–0.07)
Basophils Absolute: 0 K/uL (ref 0.0–0.1)
Basophils Relative: 1 %
Eosinophils Absolute: 0.1 K/uL (ref 0.0–0.5)
Eosinophils Relative: 1 %
HCT: 42.1 % (ref 39.0–52.0)
Hemoglobin: 13.7 g/dL (ref 13.0–17.0)
Immature Granulocytes: 1 %
Lymphocytes Relative: 14 %
Lymphs Abs: 0.9 K/uL (ref 0.7–4.0)
MCH: 30 pg (ref 26.0–34.0)
MCHC: 32.5 g/dL (ref 30.0–36.0)
MCV: 92.1 fL (ref 80.0–100.0)
Monocytes Absolute: 0.9 K/uL (ref 0.1–1.0)
Monocytes Relative: 13 %
Neutro Abs: 4.6 K/uL (ref 1.7–7.7)
Neutrophils Relative %: 70 %
Platelets: 177 K/uL (ref 150–400)
RBC: 4.57 MIL/uL (ref 4.22–5.81)
RDW: 12.6 % (ref 11.5–15.5)
WBC: 6.5 K/uL (ref 4.0–10.5)
nRBC: 0 % (ref 0.0–0.2)

## 2024-05-21 LAB — URINALYSIS, ROUTINE W REFLEX MICROSCOPIC
Bilirubin Urine: NEGATIVE
Glucose, UA: 50 mg/dL — AB
Hgb urine dipstick: NEGATIVE
Ketones, ur: NEGATIVE mg/dL
Leukocytes,Ua: NEGATIVE
Nitrite: NEGATIVE
Protein, ur: NEGATIVE mg/dL
Specific Gravity, Urine: 1.018 (ref 1.005–1.030)
pH: 5 (ref 5.0–8.0)

## 2024-05-21 MED ORDER — SODIUM CHLORIDE 0.9 % IV BOLUS
1000.0000 mL | Freq: Once | INTRAVENOUS | Status: AC
  Administered 2024-05-21: 1000 mL via INTRAVENOUS

## 2024-05-21 NOTE — ED Provider Notes (Signed)
-----------------------------------------   3:20 PM on 05/21/2024 -----------------------------------------  Blood pressure (!) 163/53, pulse (!) 50, temperature 98.1 F (36.7 C), temperature source Oral, resp. rate 18, height 5' 8 (1.727 m), weight 73.9 kg, SpO2 97%.  Assuming care from [name], PA-C.  In short, [name] is a 68 y.o. male with a chief complaint of Dysuria .  Refer to the original H&P for additional details.  The current plan of care is to await return call from kidney transplant team at [facility] to discuss acute change in kidney function.  ----------------------------------------- 6:21 PM on 05/21/2024 -----------------------------------------  Consulted with Dr. Greig Ku at [facility]. Plan will be to have patient follow up as outpatient with his transplant team. Patient and family aware to continue medication as prescribed.  We also discussed foley catheter. On arrival, he only had 30ml of urine when catheter was inserted. While here for several hours, he had a total output of about . I recommended clamping the catheter and have him try and urinate once he feels like he needs to urinate. Patient is concerned that he will feel pressure in his bladder again and not be able to urinate once he is home. He reports he had a similar issue about a year ago and had the catheter for about 10 days. Catheter left in place. Strict ER return precautions discussed.   Clinical Course as of 05/21/24 1820  Mon May 21, 2024  1308 Creatinine(!): 2.13 [MM]    Clinical Course User Index [MM] Darrelyn Ozell Jann Herlinda Kirk KATHEE, FNP 05/21/24 1842    Floy Roberts, MD 05/27/24 [phone]

## 2024-05-21 NOTE — ED Triage Notes (Signed)
 Pt comes in via pov with complaints of not being able to urinate since last night. Pt states that over the past couple of days he had issues with being to empty his bladder while urinating. Pt complains of pain in his bladder and pain in his penis. Pt has the strong urge to urinate, but only can get out a couple drops at a time. Pt complains of pain 8/10 at this time. Pt states that he has had prostates issues in the past, and had a tube placed to help with urine drainage about a year ago when this happened last.   Bladder scan revealed: 0 mL Performed multiple times with same result.

## 2024-05-21 NOTE — Discharge Instructions (Addendum)
 It has been determined that you are  Please follow up with your transplant team.

## 2024-05-21 NOTE — ED Notes (Signed)
 Pt's foley left in per NP Triplett until pt sees his urologist

## 2024-05-21 NOTE — ED Notes (Signed)
 Pt given a sandwich meal and water .

## 2024-05-21 NOTE — Progress Notes (Deleted)
 05/22/2024 4:13 PM   [name] [birth year] [id number]  Referring provider: Abbey Bruckner, MD [address] [place],  [place] [address]  Urological history: 1. BPH with LU TS - PSA (01/2024) 0.2 - TRUS (2022) 43 cc - cysto (2022) NED - UDS (2024) non diagnostic - UroLift (06/2023)  - managed on finasteride  5 mg daily, tamsulosin  0.8 mg daily  2. Nocturia - oxybutynin  caused retention/severe constipation  - Trospium  caused retention  3. Renal transplant  - serum creatinine (01/2024) 1.18, eGFR 67 - right renal transplant (2016)  - followed by [facility]  4. ED  5. Pyelonephritis  - hospitalized (2021)  No chief complaint on file.  HPI: [name] is a 68 y.o. man who presents today for having trouble using the restroom and just dribbling.  Previous records reviewed.   He was seen in the emergency department yesterday for the complaints of not being able to urinate since the evening before.  He stated that he was having issues for the past couple days with being able to empty his bladder while urinating.  He is complaining of pain in his bladder and pain in his penis.  He has a strong urge to urinate, but he could only urinate a drop at a time.  He has bladder scan noted 0 residual.  His urinalysis was yellow clear, specific gravity 1.018, pH 5.0, 50 glucose.  His BUN was 71 and his serum creatinine was 2.13 with a eGFR of 33.  CBC with differential was negative.      PMH: Past Medical History:  Diagnosis Date   Acute exacerbation of CHF (congestive heart failure) (HCC) 12/16/2023   Acute on chronic congestive heart failure (HCC) 11/03/2023   Adenomatous polyp of colon 01/30/2024   AKI (acute kidney injury) 12/16/2023   Aortic atherosclerosis    Arthritis    BPH with LUTS s/p UroLift with Dr. Penne in November 2024 on finasteride , Flomax , and Sanctura   and sees [facility]    BPH with LUTS, s/p urolift with Dr. Penne 06/2023, on Finasteride , Flomax ,  Sancture, sees    Carotid arterial disease    a.) s/p LEFT CEA on 09/06/2019   Chest pain 09/20/2017   Colon cancer screening 01/30/2024   Coronary artery disease    a.) MI with stents x 2 in 2004; b.) MI with stent x 1 2005; c.) s/p CABG x 2 06/02/2006 (LIMA-LAD, SVG-PDA); d.) LHC/PCI 08/25/2007: 80% oD1 (2.5 x 12 mm Xience V DES)   DDD (degenerative disc disease), lumbar    Dyspepsia 01/30/2024   Dyspnea    ESRD (end stage renal disease) (HCC)    a.) s/p RIGHT renal 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/2015   GERD (gastroesophageal reflux disease)    History of blood transfusion    Hospitalization within last 30 days 06/03/2020   Hypertension    Long term current use of aspirin     Long term current use of immunosuppressive drug    a.) mycophenolate  + tacrolimus    Myocardial infarction [facility]) 2004   a.) details unclear; stents x 2 (unknown type/location)   Myocardial infarction (HCC) 2005   a.) details unclear; stent x 1 (unknown type/location)   On apixaban  therapy    Orthopnea 12/16/2023   PAF (paroxysmal atrial fibrillation) (HCC)    a.) CHA2DS2-VASc = 5 (age, HTN, vascular disease history/MI, T2DM) as of 06/10/2023; b.) cardiac rate/rhythm maintained intrinsically without pharmacological intervention; chronically anticoagulated using apixaban    Sepsis due to gram-negative UTI (HCC) 04/27/2020  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   From DM and HTN, f/u with [facility] every 6 months   Subconjunctival hemorrhage of right eye 12/15/2023   T2DM (type 2 diabetes mellitus) (HCC)    Unstable angina (HCC)    Vitamin D deficiency     Surgical History: Past Surgical History:  Procedure Laterality Date   CATARACT EXTRACTION Bilateral    CATARACT EXTRACTION W/PHACO Right 02/03/2017   Procedure: CATARACT EXTRACTION PHACO AND INTRAOCULAR LENS PLACEMENT (IOC);  Surgeon: Mittie Gaskin, MD;  Location: [facility] ORS;  Service: Ophthalmology;  Laterality: Right;  US  00:35.8 AP% 12.8 CDE 4.57 Fluid lot #  [id number]   COLONOSCOPY N/A 01/30/2024   Procedure: COLONOSCOPY;  Surgeon: Therisa Bi, MD;  Location: [facility] ENDOSCOPY;  Service: Gastroenterology;  Laterality: N/A;  IDDM   COLONOSCOPY WITH PROPOFOL  N/A 12/30/2021   Procedure: COLONOSCOPY WITH PROPOFOL ;  Surgeon: Therisa Bi, MD;  Location: [facility] ENDOSCOPY;  Service: Gastroenterology;  Laterality: N/A;   CORONARY ANGIOPLASTY WITH STENT PLACEMENT Left 2004   stents x 2   CORONARY ANGIOPLASTY WITH STENT PLACEMENT Left 2005   stent x 1   CORONARY ANGIOPLASTY WITH STENT PLACEMENT Left 08/25/2007   Procedure: CORONARY ANGIOPLASTY WITH STENT PLACEMENT; Location: [facility]; Surgeon: Valinda Cage, MD   CORONARY ARTERY BYPASS GRAFT N/A 06/08/2006   Procedure: CORONARY ARTERY BYPASS GRAFT; Location: [facility]; Surgeon: Maude Sharps, MD   CYSTOSCOPY WITH INSERTION OF UROLIFT N/A 06/13/2023   Procedure: CYSTOSCOPY WITH INSERTION OF UROLIFT;  Surgeon: Penne Knee, MD;  Location: [facility] ORS;  Service: Urology;  Laterality: N/A;   ENDARTERECTOMY Left 09/06/2019   Procedure: ENDARTERECTOMY CAROTID;  Surgeon: Marea Selinda RAMAN, MD;  Location: [facility] ORS;  Service: Vascular;  Laterality: Left;   ESOPHAGOGASTRODUODENOSCOPY N/A 01/30/2024   Procedure: EGD (ESOPHAGOGASTRODUODENOSCOPY);  Surgeon: Therisa Bi, MD;  Location: [facility] ENDOSCOPY;  Service: Gastroenterology;  Laterality: N/A;   HIP FRACTURE SURGERY     KIDNEY TRANSPLANT Right 05/10/2015   LEFT HEART CATH AND CORONARY ANGIOGRAPHY Left 05/20/2006   Procedure: LEFT HEART CATH AND CORONARY ANGIOGRAPHY; Location: [facility]; Surgeon: Denyse Bathe, MD   LEFT HEART CATH AND CORONARY ANGIOGRAPHY Left 11/08/2023   Procedure: LEFT HEART CATH AND CORONARY ANGIOGRAPHY with possible intervention;  Surgeon: Bathe Denyse LABOR, MD;  Location: [facility] INVASIVE CV LAB;  Service: Cardiovascular;  Laterality: Left;   LEFT HEART CATH AND CORS/GRAFTS ANGIOGRAPHY Left 08/10/2007   Procedure: LEFT HEART CATH AND CORS/GRAFTS ANGIOGRAPHY; Location: [facility];  Surgeon: Denyse Bathe, MD   LEFT HEART CATH AND CORS/GRAFTS ANGIOGRAPHY Left 05/04/2011   Procedure: LEFT HEART CATH AND CORS/GRAFTS ANGIOGRAPHY; Location: [facility]; Surgeon: Denyse Bathe, MD   POLYPECTOMY  01/30/2024   Procedure: POLYPECTOMY, INTESTINE;  Surgeon: Therisa Bi, MD;  Location[facility] ENDOSCOPY;  Service: Gastroenterology;;    Home Medications:  Allergies as of 05/22/2024       Reactions   Oxybutynin  Other (See Comments)   Incomplete bladder emptying   Trospium  Chloride Other (See Comments)   Urinary Retention        Medication List        Accurate as of May 21, 2024  4:13 PM. If you have any questions, ask your nurse or doctor.          albuterol 108 (90 Base) MCG/ACT inhaler Commonly known as: VENTOLIN HFA Inhale 2 puffs into the lungs every 6 (six) hours as needed for wheezing or shortness of breath.   amiodarone  200 MG tablet Commonly known as: PACERONE  Take 1 tablet (200 mg total) by mouth  daily.   amoxicillin-clavulanate 875-125 MG tablet Commonly known as: AUGMENTIN Take 1 tablet by mouth 2 (two) times daily.   ascorbic acid 500 MG tablet Commonly known as: VITAMIN C Take 500 mg by mouth daily.   aspirin  EC 81 MG tablet Take 1 tablet (81 mg total) by mouth daily at 6 (six) AM.   atorvastatin  40 MG tablet Commonly known as: LIPITOR Take 40 mg by mouth at bedtime.   CellCept  250 MG capsule Generic drug: mycophenolate  Take 500 mg by mouth 2 (two) times daily.   Eliquis  5 MG Tabs tablet Generic drug: apixaban  Take 5 mg by mouth 2 (two) times daily.   finasteride  5 MG tablet Commonly known as: PROSCAR  Take 1 tablet (5 mg total) by mouth daily.   FreeStyle Libre 2 Sensor Misc by Does not apply route.   FreeStyle Libre 2 Plus Sensor Misc 1 each by Other route every 14 (fourteen) days.   guaiFENesin -codeine 100-10 MG/5ML syrup Take 5 mLs by mouth every 6 (six) hours as needed.   HumaLOG KwikPen 100 UNIT/ML KwikPen Generic drug:  insulin  lispro Inject 10-12 Units into the skin 3 (three) times daily. Inject 10 units daily at breakfast, 10 units at lunch and up to 10 units or less at supper per SSI   hydrALAZINE  25 MG tablet Commonly known as: APRESOLINE  Take 1 tablet (25 mg total) by mouth 2 (two) times daily.   Insulin  Pen Needle 33G X 4 MM Misc To use with insulin  pen 4 times per day. E 13.9   Lantus  SoloStar 100 UNIT/ML Solostar Pen Generic drug: insulin  glargine Inject 30 Units into the skin at bedtime.   linagliptin  5 MG Tabs tablet Commonly known as: TRADJENTA  Take 1 tablet (5 mg total) by mouth daily.   losartan  100 MG tablet Commonly known as: Cozaar  Take 1 tablet (100 mg total) by mouth daily.   nitroGLYCERIN  0.4 MG SL tablet Commonly known as: Nitrostat  Place 1 tablet (0.4 mg total) under the tongue every 5 (five) minutes as needed for chest pain.   omeprazole 20 MG capsule Commonly known as: PRILOSEC Take 20 mg by mouth daily.   Prograf  1 MG capsule Generic drug: tacrolimus  Take 1 mg by mouth every morning.   tacrolimus  0.5 MG capsule Commonly known as: PROGRAF  Take 0.5 mg by mouth at bedtime.   tamsulosin  0.4 MG Caps capsule Commonly known as: FLOMAX  Take 2 capsules (0.8 mg total) by mouth daily.   torsemide 10 MG tablet Commonly known as: DEMADEX Take 1 tablet (10 mg total) by mouth 2 (two) times daily.   Vitamin D 50 MCG (2000 UT) Caps Take 2,000 Units by mouth daily.        Allergies:  Allergies  Allergen Reactions   Oxybutynin  Other (See Comments)    Incomplete bladder emptying   Trospium  Chloride Other (See Comments)    Urinary Retention    Family History: Family History  Problem Relation Age of Onset   Osteoporosis Mother    Drug abuse Sister    Drug abuse Brother     Social History:  reports that he has quit smoking. He has never used smokeless tobacco. He reports that he does not drink alcohol and does not use drugs.  ROS: Pertinent ROS in  HPI  Physical Exam: There were no vitals taken for this visit.  Constitutional:  Well nourished. Alert and oriented, No acute distress. HEENT: [place] AT, moist mucus membranes.  Trachea midline, no masses. Cardiovascular: No clubbing, cyanosis, or  edema. Respiratory: Normal respiratory effort, no increased work of breathing. GI: Abdomen is soft, non tender, non distended, no abdominal masses. Liver and spleen not palpable.  No hernias appreciated.  Stool sample for occult testing is not indicated.   GU: No CVA tenderness.  No bladder fullness or masses.  Patient with circumcised/uncircumcised phallus. ***Foreskin easily retracted***  Urethral meatus is patent.  No penile discharge. No penile lesions or rashes. Scrotum without lesions, cysts, rashes and/or edema.  Testicles are located scrotally bilaterally. No masses are appreciated in the testicles. Left and right epididymis are normal. Rectal: Patient with  normal sphincter tone. Anus and perineum without scarring or rashes. No rectal masses are appreciated. Prostate is approximately *** grams, *** nodules are appreciated. Seminal vesicles are normal. Skin: No rashes, bruises or suspicious lesions. Lymph: No cervical or inguinal adenopathy. Neurologic: Grossly intact, no focal deficits, moving all 4 extremities. Psychiatric: Normal mood and affect.   Laboratory Data: See EPIC and HPI I have reviewed the labs.  See HPI.     Pertinent Imaging: N/A  Assessment & Plan:    1. BPH with LU TS -  ***  2. Nocturia - ***  No follow-ups on file.  These notes generated with voice recognition software. I apologize for typographical errors.  CLOTILDA HELON RIGGERS  [facility] [address]  [address] [place], [place] [address] [phone]

## 2024-05-21 NOTE — ED Provider Notes (Signed)
 [facility] Provider Note    Event Date/Time   First MD Initiated Contact with Patient 05/21/24 1159     (approximate)   History   Dysuria   HPI  [name] is a 68 y.o. male with history of a kidney transplant in 2016, diabetes, hypertension, other chronic medical problems presents emergency department stating he has been unable to urinate since last night.  States his abdomen feels full.  No fever or chills.      Physical Exam   Triage Vital Signs: ED Triage Vitals  Encounter Vitals Group     BP 05/21/24 1130 (!) 131/49     Girls Systolic BP Percentile --      Girls Diastolic BP Percentile --      Boys Systolic BP Percentile --      Boys Diastolic BP Percentile --      Pulse Rate 05/21/24 1130 61     Resp 05/21/24 1130 18     Temp 05/21/24 1130 97.6 F (36.4 C)     Temp src --      SpO2 05/21/24 1130 96 %     Weight 05/21/24 1131 163 lb (73.9 kg)     Height 05/21/24 1131 5' 8 (1.727 m)     Head Circumference --      Peak Flow --      Pain Score 05/21/24 1130 8     Pain Loc --      Pain Education --      Exclude from Growth Chart --     Most recent vital signs: Vitals:   05/21/24 1130  BP: (!) 131/49  Pulse: 61  Resp: 18  Temp: 97.6 F (36.4 C)  SpO2: 96%     General: Awake, no distress.   CV:  Good peripheral perfusion. regular rate and  rhythm Resp:  Normal effort.  Abd:  No distention.  Mildly tender around the umbilicus Other:      ED Results / Procedures / Treatments   Labs (all labs ordered are listed, but only abnormal results are displayed) Labs Reviewed  COMPREHENSIVE METABOLIC PANEL WITH GFR - Abnormal; Notable for the following components:      Result Value   Glucose, Bld 237 (*)    BUN 71 (*)    Creatinine, Ser 2.13 (*)    Calcium  8.8 (*)    GFR, Estimated 33 (*)    All other components within normal limits  URINALYSIS, ROUTINE W REFLEX MICROSCOPIC - Abnormal; Notable for the following  components:   Color, Urine YELLOW (*)    APPearance CLEAR (*)    Glucose, UA 50 (*)    All other components within normal limits  CBC WITH DIFFERENTIAL/PLATELET     EKG     RADIOLOGY Bladder scan    PROCEDURES:   Procedures  Critical Care:  no Chief Complaint  Patient presents with   Dysuria      MEDICATIONS ORDERED IN ED: Medications  sodium chloride  0.9 % bolus 1,000 mL (0 mLs Intravenous Stopped 05/21/24 1531)     IMPRESSION / MDM / ASSESSMENT AND PLAN / ED COURSE  I reviewed the triage vital signs and the nursing notes.                              Differential diagnosis includes, but is not limited to, urinary retention, UTI, kidney stone, AKI, BPH  Patient's presentation is  most consistent with acute illness / injury with system symptoms.   CBC with assuring, metabolic panel shows elevated BUN of 71, creatinine of 2.13 these values have doubled since his last visit 10 days ago.  Due to this we will go ahead and give him fluids.  His wife states he was not drinking much because he urinate and was afraid to be in a lot of pain.  He also recently changed diuretics.  He was on furosemide  and was given torsemide by his regular doctor.  Obtained the number for his kidney transplant specialist at [facility].  Will try to call them to discuss case.  ----------------------------------------- 3:06 PM on 05/21/2024 ----------------------------------------- Called to [facility], spoke with secretary, is trying to get provider to call me back, I did give her the BUN and creatinine numbers from 10 days ago and today.  Care transferred to [facility], FNP at shift change, plan is to await return call from [facility].  To determine whether to have him return to furosemide  instead of torsemide.  Patient currently has IV fluids running.  FINAL CLINICAL IMPRESSION(S) / ED DIAGNOSES   Final diagnoses:  Urinary retention  Elevated BUN     Rx / DC Orders   ED Discharge Orders      None        Note:  This document was prepared using Dragon voice recognition software and may include unintentional dictation errors.    Gasper Devere ORN, PA-C 05/21/24 1532    Floy Roberts, MD 05/27/24 [phone]

## 2024-05-22 ENCOUNTER — Ambulatory Visit: Admitting: Urology

## 2024-05-22 DIAGNOSIS — R351 Nocturia: Secondary | ICD-10-CM

## 2024-05-22 DIAGNOSIS — N401 Enlarged prostate with lower urinary tract symptoms: Secondary | ICD-10-CM

## 2024-05-23 LAB — URINE CULTURE: Culture: NO GROWTH

## 2024-05-24 ENCOUNTER — Other Ambulatory Visit

## 2024-05-27 NOTE — Progress Notes (Unsigned)
 05/28/2024 8:18 PM   [name] 07/11/1956 [id number]  Referring provider: Abbey Bruckner, MD [address] [place],  [place] [address]  Urological history: 1. BPH with LU TS - PSA (01/2024) 0.2 - TRUS (2022) 43 cc - cysto (2022) NED - UDS (2024) non diagnostic - UroLift (06/2023)  - managed on finasteride  5 mg daily, tamsulosin  0.8 mg daily  2. Nocturia - oxybutynin  caused retention/severe constipation  - Trospium  caused retention  3. Renal transplant  - serum creatinine (01/2024) 1.18, eGFR 67 - right renal transplant (2016)  - followed by [facility]  4. ED  5. Pyelonephritis  - hospitalized (2021)  No chief complaint on file.  HPI: [name] is a 68 y.o. man who presents today for having trouble using the restroom and just dribbling.  Previous records reviewed.   He was seen in the emergency department yesterday for the complaints of not being able to urinate since the evening before.  He stated that he was having issues for the past couple days with being able to empty his bladder while urinating.  He is complaining of pain in his bladder and pain in his penis.  He has a strong urge to urinate, but he could only urinate a drop at a time.  He has bladder scan noted 0 residual.  His urinalysis was yellow clear, specific gravity 1.018, pH 5.0, 50 glucose.  His BUN was 71 and his serum creatinine was 2.13 with a eGFR of 33.  CBC with differential was negative.    Trospium  ***    PMH: Past Medical History:  Diagnosis Date   Acute exacerbation of CHF (congestive heart failure) (HCC) 12/16/2023   Acute on chronic congestive heart failure (HCC) 11/03/2023   Adenomatous polyp of colon 01/30/2024   AKI (acute kidney injury) 12/16/2023   Aortic atherosclerosis    Arthritis    BPH with LUTS s/p UroLift with Dr. Penne in November 2024 on finasteride , Flomax , and Sanctura   and sees [facility]    BPH with LUTS, s/p urolift with Dr. Penne 06/2023, on  Finasteride , Flomax , Sancture, sees    Carotid arterial disease    a.) s/p LEFT CEA on 09/06/2019   Chest pain 09/20/2017   Colon cancer screening 01/30/2024   Coronary artery disease    a.) MI with stents x 2 in 2004; b.) MI with stent x 1 2005; c.) s/p CABG x 2 06/02/2006 (LIMA-LAD, SVG-PDA); d.) LHC/PCI 08/25/2007: 80% oD1 (2.5 x 12 mm Xience V DES)   DDD (degenerative disc disease), lumbar    Dyspepsia 01/30/2024   Dyspnea    ESRD (end stage renal disease) (HCC)    a.) s/p RIGHT renal 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/2015   GERD (gastroesophageal reflux disease)    History of blood transfusion    Hospitalization within last 30 days 06/03/2020   Hypertension    Long term current use of aspirin     Long term current use of immunosuppressive drug    a.) mycophenolate  + tacrolimus    Myocardial infarction (HCC) 2004   a.) details unclear; stents x 2 (unknown type/location)   Myocardial infarction (HCC) 2005   a.) details unclear; stent x 1 (unknown type/location)   On apixaban  therapy    Orthopnea 12/16/2023   PAF (paroxysmal atrial fibrillation) (HCC)    a.) CHA2DS2-VASc = 5 (age, HTN, vascular disease history/MI, T2DM) as of 06/10/2023; b.) cardiac rate/rhythm maintained intrinsically without pharmacological intervention; chronically anticoagulated using apixaban    Sepsis due to gram-negative UTI (  HCC) 04/27/2020   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   From DM and HTN, f/u with [facility] every 6 months   Subconjunctival hemorrhage of right eye 12/15/2023   T2DM (type 2 diabetes mellitus) (HCC)    Unstable angina (HCC)    Vitamin D deficiency     Surgical History: Past Surgical History:  Procedure Laterality Date   CATARACT EXTRACTION Bilateral    CATARACT EXTRACTION W/PHACO Right 02/03/2017   Procedure: CATARACT EXTRACTION PHACO AND INTRAOCULAR LENS PLACEMENT (IOC);  Surgeon: Mittie Gaskin, MD;  Location: [facility] ORS;  Service: Ophthalmology;  Laterality: Right;  US  00:35.8 AP%  12.8 CDE 4.57 Fluid lot # [id number]   COLONOSCOPY N/A 01/30/2024   Procedure: COLONOSCOPY;  Surgeon: Therisa Bi, MD;  Location: [facility] ENDOSCOPY;  Service: Gastroenterology;  Laterality: N/A;  IDDM   COLONOSCOPY WITH PROPOFOL  N/A 12/30/2021   Procedure: COLONOSCOPY WITH PROPOFOL ;  Surgeon: Therisa Bi, MD;  Location: [facility] ENDOSCOPY;  Service: Gastroenterology;  Laterality: N/A;   CORONARY ANGIOPLASTY WITH STENT PLACEMENT Left 2004   stents x 2   CORONARY ANGIOPLASTY WITH STENT PLACEMENT Left 2005   stent x 1   CORONARY ANGIOPLASTY WITH STENT PLACEMENT Left 08/25/2007   Procedure: CORONARY ANGIOPLASTY WITH STENT PLACEMENT; Location: [facility]; Surgeon: Valinda Cage, MD   CORONARY ARTERY BYPASS GRAFT N/A 06/08/2006   Procedure: CORONARY ARTERY BYPASS GRAFT; Location: [facility]; Surgeon: Maude Sharps, MD   CYSTOSCOPY WITH INSERTION OF UROLIFT N/A 06/13/2023   Procedure: CYSTOSCOPY WITH INSERTION OF UROLIFT;  Surgeon: Penne Knee, MD;  Location: [facility] ORS;  Service: Urology;  Laterality: N/A;   ENDARTERECTOMY Left 09/06/2019   Procedure: ENDARTERECTOMY CAROTID;  Surgeon: Marea Selinda RAMAN, MD;  Location: [facility] ORS;  Service: Vascular;  Laterality: Left;   ESOPHAGOGASTRODUODENOSCOPY N/A 01/30/2024   Procedure: EGD (ESOPHAGOGASTRODUODENOSCOPY);  Surgeon: Therisa Bi, MD;  Location: [facility] ENDOSCOPY;  Service: Gastroenterology;  Laterality: N/A;   HIP FRACTURE SURGERY     KIDNEY TRANSPLANT Right 05/10/2015   LEFT HEART CATH AND CORONARY ANGIOGRAPHY Left 05/20/2006   Procedure: LEFT HEART CATH AND CORONARY ANGIOGRAPHY; Location: [facility]; Surgeon: Denyse Bathe, MD   LEFT HEART CATH AND CORONARY ANGIOGRAPHY Left 11/08/2023   Procedure: LEFT HEART CATH AND CORONARY ANGIOGRAPHY with possible intervention;  Surgeon: Bathe Denyse LABOR, MD;  Location: [facility] INVASIVE CV LAB;  Service: Cardiovascular;  Laterality: Left;   LEFT HEART CATH AND CORS/GRAFTS ANGIOGRAPHY Left 08/10/2007   Procedure: LEFT HEART CATH AND CORS/GRAFTS  ANGIOGRAPHY; Location: [facility]; Surgeon: Denyse Bathe, MD   LEFT HEART CATH AND CORS/GRAFTS ANGIOGRAPHY Left 05/04/2011   Procedure: LEFT HEART CATH AND CORS/GRAFTS ANGIOGRAPHY; Location: [facility]; Surgeon: Denyse Bathe, MD   POLYPECTOMY  01/30/2024   Procedure: POLYPECTOMY, INTESTINE;  Surgeon: Therisa Bi, MD;  Location: [facility] ENDOSCOPY;  Service: Gastroenterology;;    Home Medications:  Allergies as of 05/28/2024       Reactions   Oxybutynin  Other (See Comments)   Incomplete bladder emptying   Trospium  Chloride Other (See Comments)   Urinary Retention        Medication List        Accurate as of May 27, 2024  8:18 PM. If you have any questions, ask your nurse or doctor.          albuterol 108 (90 Base) MCG/ACT inhaler Commonly known as: VENTOLIN HFA Inhale 2 puffs into the lungs every 6 (six) hours as needed for wheezing or shortness of breath.   amiodarone  200 MG tablet Commonly known as: PACERONE  Take 1 tablet (200  mg total) by mouth daily.   amoxicillin-clavulanate 875-125 MG tablet Commonly known as: AUGMENTIN Take 1 tablet by mouth 2 (two) times daily.   ascorbic acid 500 MG tablet Commonly known as: VITAMIN C Take 500 mg by mouth daily.   aspirin  EC 81 MG tablet Take 1 tablet (81 mg total) by mouth daily at 6 (six) AM.   atorvastatin  40 MG tablet Commonly known as: LIPITOR Take 40 mg by mouth at bedtime.   CellCept  250 MG capsule Generic drug: mycophenolate  Take 500 mg by mouth 2 (two) times daily.   Eliquis  5 MG Tabs tablet Generic drug: apixaban  Take 5 mg by mouth 2 (two) times daily.   finasteride  5 MG tablet Commonly known as: PROSCAR  Take 1 tablet (5 mg total) by mouth daily.   FreeStyle Libre 2 Sensor Misc by Does not apply route.   FreeStyle Libre 2 Plus Sensor Misc 1 each by Other route every 14 (fourteen) days.   guaiFENesin -codeine 100-10 MG/5ML syrup Take 5 mLs by mouth every 6 (six) hours as needed.   HumaLOG KwikPen 100 UNIT/ML  KwikPen Generic drug: insulin  lispro Inject 10-12 Units into the skin 3 (three) times daily. Inject 10 units daily at breakfast, 10 units at lunch and up to 10 units or less at supper per SSI   hydrALAZINE  25 MG tablet Commonly known as: APRESOLINE  Take 1 tablet (25 mg total) by mouth 2 (two) times daily.   Insulin  Pen Needle 33G X 4 MM Misc To use with insulin  pen 4 times per day. E 13.9   Lantus  SoloStar 100 UNIT/ML Solostar Pen Generic drug: insulin  glargine Inject 30 Units into the skin at bedtime.   linagliptin  5 MG Tabs tablet Commonly known as: TRADJENTA  Take 1 tablet (5 mg total) by mouth daily.   losartan  100 MG tablet Commonly known as: Cozaar  Take 1 tablet (100 mg total) by mouth daily.   nitroGLYCERIN  0.4 MG SL tablet Commonly known as: Nitrostat  Place 1 tablet (0.4 mg total) under the tongue every 5 (five) minutes as needed for chest pain.   omeprazole 20 MG capsule Commonly known as: PRILOSEC Take 20 mg by mouth daily.   Prograf  1 MG capsule Generic drug: tacrolimus  Take 1 mg by mouth every morning.   tacrolimus  0.5 MG capsule Commonly known as: PROGRAF  Take 0.5 mg by mouth at bedtime.   tamsulosin  0.4 MG Caps capsule Commonly known as: FLOMAX  Take 2 capsules (0.8 mg total) by mouth daily.   torsemide 10 MG tablet Commonly known as: DEMADEX Take 1 tablet (10 mg total) by mouth 2 (two) times daily.   Vitamin D 50 MCG (2000 UT) Caps Take 2,000 Units by mouth daily.        Allergies:  Allergies  Allergen Reactions   Oxybutynin  Other (See Comments)    Incomplete bladder emptying   Trospium  Chloride Other (See Comments)    Urinary Retention    Family History: Family History  Problem Relation Age of Onset   Osteoporosis Mother    Drug abuse Sister    Drug abuse Brother     Social History:  reports that he has quit smoking. He has never used smokeless tobacco. He reports that he does not drink alcohol and does not use  drugs.  ROS: Pertinent ROS in HPI  Physical Exam: There were no vitals taken for this visit.  Constitutional:  Well nourished. Alert and oriented, No acute distress. HEENT: [place] AT, moist mucus membranes.  Trachea midline, no masses. Cardiovascular:  No clubbing, cyanosis, or edema. Respiratory: Normal respiratory effort, no increased work of breathing. GI: Abdomen is soft, non tender, non distended, no abdominal masses. Liver and spleen not palpable.  No hernias appreciated.  Stool sample for occult testing is not indicated.   GU: No CVA tenderness.  No bladder fullness or masses.  Patient with circumcised/uncircumcised phallus. ***Foreskin easily retracted***  Urethral meatus is patent.  No penile discharge. No penile lesions or rashes. Scrotum without lesions, cysts, rashes and/or edema.  Testicles are located scrotally bilaterally. No masses are appreciated in the testicles. Left and right epididymis are normal. Rectal: Patient with  normal sphincter tone. Anus and perineum without scarring or rashes. No rectal masses are appreciated. Prostate is approximately *** grams, *** nodules are appreciated. Seminal vesicles are normal. Skin: No rashes, bruises or suspicious lesions. Lymph: No cervical or inguinal adenopathy. Neurologic: Grossly intact, no focal deficits, moving all 4 extremities. Psychiatric: Normal mood and affect.   Laboratory Data: See EPIC and HPI I have reviewed the labs.  See HPI.     Pertinent Imaging: N/A  Assessment & Plan:    1. BPH with LU TS -  ***  2. Nocturia - ***  No follow-ups on file.  These notes generated with voice recognition software. I apologize for typographical errors.  CLOTILDA HELON RIGGERS  [facility] [address]  [address] [place], [place] [address] [phone]

## 2024-05-28 ENCOUNTER — Ambulatory Visit: Admitting: Urology

## 2024-05-28 ENCOUNTER — Encounter: Payer: Self-pay | Admitting: Urology

## 2024-05-28 ENCOUNTER — Ambulatory Visit: Admit: 2024-05-28 | Discharge: 2024-05-29 | Payer: Medicare (Managed Care)

## 2024-05-28 VITALS — BP 116/54 | HR 55 | Wt 164.0 lb

## 2024-05-28 DIAGNOSIS — N401 Enlarged prostate with lower urinary tract symptoms: Secondary | ICD-10-CM | POA: Diagnosis not present

## 2024-05-28 DIAGNOSIS — Z940 Kidney transplant status: Secondary | ICD-10-CM | POA: Diagnosis not present

## 2024-05-28 DIAGNOSIS — R351 Nocturia: Secondary | ICD-10-CM

## 2024-05-28 DIAGNOSIS — Z7960 Long term (current) use of unspecified immunomodulators and immunosuppressants: Secondary | ICD-10-CM | POA: Diagnosis not present

## 2024-05-28 LAB — BLADDER SCAN AMB NON-IMAGING: Scan Result: 11

## 2024-05-28 NOTE — Progress Notes (Signed)
 Catheter Removal  Patient is present today for a catheter removal.  10ml of water  was drained from the balloon. A 16FR foley cath was removed from the bladder, no complications were noted. Patient tolerated well.  Performed by: Danika Kluender LPN   Follow up/ Additional notes: No follow-ups on file.

## 2024-05-29 ENCOUNTER — Ambulatory Visit: Admitting: Cardiovascular Disease

## 2024-05-29 ENCOUNTER — Encounter: Payer: Self-pay | Admitting: Cardiovascular Disease

## 2024-05-31 ENCOUNTER — Other Ambulatory Visit (INDEPENDENT_AMBULATORY_CARE_PROVIDER_SITE_OTHER): Payer: Self-pay | Admitting: Vascular Surgery

## 2024-05-31 DIAGNOSIS — I6523 Occlusion and stenosis of bilateral carotid arteries: Secondary | ICD-10-CM

## 2024-06-01 ENCOUNTER — Emergency Department

## 2024-06-01 ENCOUNTER — Other Ambulatory Visit: Payer: Self-pay

## 2024-06-01 ENCOUNTER — Observation Stay
Admission: EM | Admit: 2024-06-01 | Discharge: 2024-06-03 | Disposition: A | Discharge status: Home / Self Care | Attending: Family Medicine | Admitting: Family Medicine

## 2024-06-01 DIAGNOSIS — H3411 Central retinal artery occlusion, right eye: Secondary | ICD-10-CM | POA: Diagnosis not present

## 2024-06-01 DIAGNOSIS — Z87891 Personal history of nicotine dependence: Secondary | ICD-10-CM | POA: Diagnosis not present

## 2024-06-01 DIAGNOSIS — I130 Hypertensive heart and chronic kidney disease with heart failure and stage 1 through stage 4 chronic kidney disease, or unspecified chronic kidney disease: Secondary | ICD-10-CM | POA: Diagnosis not present

## 2024-06-01 DIAGNOSIS — N1832 Chronic kidney disease, stage 3b: Secondary | ICD-10-CM | POA: Insufficient documentation

## 2024-06-01 DIAGNOSIS — E1122 Type 2 diabetes mellitus with diabetic chronic kidney disease: Secondary | ICD-10-CM | POA: Insufficient documentation

## 2024-06-01 DIAGNOSIS — I639 Cerebral infarction, unspecified: Secondary | ICD-10-CM | POA: Diagnosis present

## 2024-06-01 DIAGNOSIS — E785 Hyperlipidemia, unspecified: Secondary | ICD-10-CM | POA: Insufficient documentation

## 2024-06-01 DIAGNOSIS — Z940 Kidney transplant status: Secondary | ICD-10-CM | POA: Diagnosis not present

## 2024-06-01 DIAGNOSIS — H5440 Blindness, one eye, unspecified eye: Principal | ICD-10-CM | POA: Insufficient documentation

## 2024-06-01 DIAGNOSIS — H5461 Unqualified visual loss, right eye, normal vision left eye: Secondary | ICD-10-CM | POA: Diagnosis present

## 2024-06-01 DIAGNOSIS — Z7982 Long term (current) use of aspirin: Secondary | ICD-10-CM | POA: Insufficient documentation

## 2024-06-01 DIAGNOSIS — I5023 Acute on chronic systolic (congestive) heart failure: Secondary | ICD-10-CM | POA: Insufficient documentation

## 2024-06-01 DIAGNOSIS — N189 Chronic kidney disease, unspecified: Secondary | ICD-10-CM

## 2024-06-01 DIAGNOSIS — H538 Other visual disturbances: Secondary | ICD-10-CM | POA: Diagnosis not present

## 2024-06-01 DIAGNOSIS — G453 Amaurosis fugax: Secondary | ICD-10-CM | POA: Insufficient documentation

## 2024-06-01 DIAGNOSIS — I671 Cerebral aneurysm, nonruptured: Secondary | ICD-10-CM | POA: Insufficient documentation

## 2024-06-01 DIAGNOSIS — Z79899 Other long term (current) drug therapy: Secondary | ICD-10-CM | POA: Insufficient documentation

## 2024-06-01 DIAGNOSIS — I480 Paroxysmal atrial fibrillation: Secondary | ICD-10-CM | POA: Diagnosis not present

## 2024-06-01 DIAGNOSIS — H5460 Unqualified visual loss, one eye, unspecified: Principal | ICD-10-CM | POA: Diagnosis present

## 2024-06-01 DIAGNOSIS — I6523 Occlusion and stenosis of bilateral carotid arteries: Secondary | ICD-10-CM | POA: Diagnosis not present

## 2024-06-01 DIAGNOSIS — Z7901 Long term (current) use of anticoagulants: Secondary | ICD-10-CM | POA: Diagnosis not present

## 2024-06-01 DIAGNOSIS — R9082 White matter disease, unspecified: Secondary | ICD-10-CM | POA: Diagnosis not present

## 2024-06-01 DIAGNOSIS — Z961 Presence of intraocular lens: Secondary | ICD-10-CM | POA: Diagnosis not present

## 2024-06-01 DIAGNOSIS — I6501 Occlusion and stenosis of right vertebral artery: Secondary | ICD-10-CM | POA: Diagnosis not present

## 2024-06-01 DIAGNOSIS — R2681 Unsteadiness on feet: Secondary | ICD-10-CM | POA: Diagnosis not present

## 2024-06-01 DIAGNOSIS — R2990 Unspecified symptoms and signs involving the nervous system: Secondary | ICD-10-CM

## 2024-06-01 DIAGNOSIS — I129 Hypertensive chronic kidney disease with stage 1 through stage 4 chronic kidney disease, or unspecified chronic kidney disease: Secondary | ICD-10-CM | POA: Diagnosis not present

## 2024-06-01 DIAGNOSIS — Z794 Long term (current) use of insulin: Secondary | ICD-10-CM | POA: Diagnosis not present

## 2024-06-01 DIAGNOSIS — R29818 Other symptoms and signs involving the nervous system: Secondary | ICD-10-CM | POA: Diagnosis not present

## 2024-06-01 LAB — CBC
HCT: 43.3 % (ref 39.0–52.0)
Hemoglobin: 14 g/dL (ref 13.0–17.0)
MCH: 30.6 pg (ref 26.0–34.0)
MCHC: 32.3 g/dL (ref 30.0–36.0)
MCV: 94.5 fL (ref 80.0–100.0)
Platelets: 162 K/uL (ref 150–400)
RBC: 4.58 MIL/uL (ref 4.22–5.81)
RDW: 12.9 % (ref 11.5–15.5)
WBC: 7.1 K/uL (ref 4.0–10.5)
nRBC: 0 % (ref 0.0–0.2)

## 2024-06-01 LAB — COMPREHENSIVE METABOLIC PANEL WITH GFR
ALT: 30 U/L (ref 0–44)
AST: 30 U/L (ref 15–41)
Albumin: 4.1 g/dL (ref 3.5–5.0)
Alkaline Phosphatase: 45 U/L (ref 38–126)
Anion gap: 10 (ref 5–15)
BUN: 41 mg/dL — ABNORMAL HIGH (ref 8–23)
CO2: 22 mmol/L (ref 22–32)
Calcium: 8.3 mg/dL — ABNORMAL LOW (ref 8.9–10.3)
Chloride: 100 mmol/L (ref 98–111)
Creatinine, Ser: 1.86 mg/dL — ABNORMAL HIGH (ref 0.61–1.24)
GFR, Estimated: 39 mL/min — ABNORMAL LOW (ref >60)
Glucose, Bld: 192 mg/dL — ABNORMAL HIGH (ref 70–99)
Potassium: 4 mmol/L (ref 3.5–5.1)
Sodium: 132 mmol/L — ABNORMAL LOW (ref 135–145)
Total Bilirubin: 1.1 mg/dL (ref 0.0–1.2)
Total Protein: 7.3 g/dL (ref 6.5–8.1)

## 2024-06-01 LAB — DIFFERENTIAL
Abs Immature Granulocytes: 0.05 K/uL (ref 0.00–0.07)
Basophils Absolute: 0 K/uL (ref 0.0–0.1)
Basophils Relative: 0 %
Eosinophils Absolute: 0.1 K/uL (ref 0.0–0.5)
Eosinophils Relative: 1 %
Immature Granulocytes: 1 %
Lymphocytes Relative: 14 %
Lymphs Abs: 1 K/uL (ref 0.7–4.0)
Monocytes Absolute: 0.7 K/uL (ref 0.1–1.0)
Monocytes Relative: 10 %
Neutro Abs: 5.2 K/uL (ref 1.7–7.7)
Neutrophils Relative %: 74 %

## 2024-06-01 LAB — PROTIME-INR
INR: 1.5 — ABNORMAL HIGH (ref 0.8–1.2)
Prothrombin Time: 18.7 s — ABNORMAL HIGH (ref 11.4–15.2)

## 2024-06-01 LAB — APTT: aPTT: 30 s (ref 24–36)

## 2024-06-01 LAB — SEDIMENTATION RATE: Sed Rate: 4 mm/h (ref 0–20)

## 2024-06-01 MED ORDER — IOHEXOL 300 MG/ML  SOLN
60.0000 mL | Freq: Once | INTRAMUSCULAR | Status: AC | PRN
  Administered 2024-06-01: 60 mL via INTRAVENOUS

## 2024-06-01 NOTE — ED Provider Notes (Signed)
 [facility] Provider Note   Event Date/Time   First MD Initiated Contact with Patient 06/01/24 2148     (approximate) History  Blurred Vision  HPI [name] is a 68 y.o. male with a stated past medical history of CAD, paroxysmal atrial fibrillation on anticoagulation, degenerative disc disease, type 2 diabetes, and hypertension who presents from the ophthalmology clinic with concerns for CRAO after acute monocular right vision loss that occurred yesterday at 1400.  Patient states that he has a small portion of the right lower aspect of his vision that does have some light sensitivity in the right eye.  Patient states that after this vision loss occurred he has not had any vision returned. ROS: Patient currently denies any tinnitus, difficulty speaking, facial droop, sore throat, chest pain, shortness of breath, abdominal pain, nausea/vomiting/diarrhea, dysuria, or weakness/numbness/paresthesias in any extremity   Physical Exam  Triage Vital Signs: ED Triage Vitals [06/01/24 1724]  Encounter Vitals Group     BP (!) 130/52     Girls Systolic BP Percentile      Girls Diastolic BP Percentile      Boys Systolic BP Percentile      Boys Diastolic BP Percentile      Pulse Rate 61     Resp 18     Temp (!) 97.5 F (36.4 C)     Temp Source Oral     SpO2 96 %     Weight      Height      Head Circumference      Peak Flow      Pain Score 2     Pain Loc      Pain Education      Exclude from Growth Chart    Most recent vital signs: Vitals:   06/01/24 1724 06/01/24 2126  BP: (!) 130/52 (!) 155/49  Pulse: 61 (!) 51  Resp: 18 14  Temp: (!) 97.5 F (36.4 C)   SpO2: 96% 98%   General: Awake, oriented x4. CV:  Good peripheral perfusion. Resp:  Normal effort. Abd:  No distention. Other:  Middle-aged well-developed, well-nourished Hispanic male resting comfortably in no acute distress.  No visual acuity in the left eye ED Results / Procedures / Treatments   Labs (all labs ordered are listed, but only abnormal results are displayed) Labs Reviewed  PROTIME-INR - Abnormal; Notable for the following components:      Result Value   Prothrombin Time 18.7 (*)    INR 1.5 (*)    All other components within normal limits  COMPREHENSIVE METABOLIC PANEL WITH GFR - Abnormal; Notable for the following components:   Sodium 132 (*)    Glucose, Bld 192 (*)    BUN 41 (*)    Creatinine, Ser 1.86 (*)    Calcium  8.3 (*)    GFR, Estimated 39 (*)    All other components within normal limits  APTT  CBC  DIFFERENTIAL  SEDIMENTATION RATE  URINE DRUG SCREEN, QUALITATIVE ([facility] ONLY)  C-REACTIVE PROTEIN  CBG MONITORING, ED   EKG ED ECG REPORT I, Artist MARLA Kerns, the attending physician, personally viewed and interpreted this ECG. Date: 06/01/2024 EKG Time: 1737 Rate: 60 Rhythm: normal sinus rhythm QRS Axis: normal Intervals: First-degree AV block ST/T Wave abnormalities: normal Narrative Interpretation: Normal sinus rhythm with first-degree AV block.  No evidence of acute ischemia RADIOLOGY ED MD interpretation: CT of the head without contrast interpreted by me shows no evidence of acute  abnormalities including no intracerebral hemorrhage, obvious masses, or significant edema - All radiology independently interpreted and agree with radiology assessment Official radiology report(s): CT HEAD WO CONTRAST Result Date: 06/01/2024 EXAM: CT HEAD WITHOUT CONTRAST 06/01/2024 06:08:19 PM TECHNIQUE: CT of the head was performed without the administration of intravenous contrast. Automated exposure control, iterative reconstruction, and/or weight based adjustment of the mA/kV was utilized to reduce the radiation dose to as low as reasonably achievable. COMPARISON: 10/19/2010 CLINICAL HISTORY: Neuro deficit, acute, stroke suspected. Blurred vision. FINDINGS: BRAIN AND VENTRICLES: No acute hemorrhage. No evidence of acute infarct. No hydrocephalus. No extra-axial  collection. No mass effect or midline shift. There is diffuse cerebral atrophy and chronic small vessel disease throughout the deep white matter. ORBITS: No acute abnormality. SINUSES: No acute abnormality. SOFT TISSUES AND SKULL: No acute soft tissue abnormality. No skull fracture. IMPRESSION: 1. No acute intracranial abnormality. 2. Diffuse cerebral atrophy and chronic small vessel disease throughout the deep white matter. Electronically signed by: Franky Crease MD 06/01/2024 06:20 PM EDT RP Workstation: [id number]   PROCEDURES: Critical Care performed: No Procedures MEDICATIONS ORDERED IN ED: Medications  iohexol  (OMNIPAQUE ) 300 MG/ML solution 60 mL (60 mLs Intravenous Contrast Given 06/01/24 2305)   IMPRESSION / MDM / ASSESSMENT AND PLAN / ED COURSE  I reviewed the triage vital signs and the nursing notes.                             The patient is on the cardiac monitor to evaluate for evidence of arrhythmia and/or significant heart rate changes. Patient's presentation is most consistent with acute presentation with potential threat to life or bodily function. Patient is a 68 year old male with the above-stated past medical history who presents for acute monocular vision loss in the right eye with a note from ophthalmology concerning for CRAO. DDx: CRAO, occipital stroke, focal seizure, retinal detachment Plan: Head CT, CT angiography of the head and neck, CBC, CMP, INR Care of this patient will be signed out to the oncoming physician at the end of my shift.  All pertinent patient information conveyed and all questions answered.  All further care and disposition decisions will be made by the oncoming physician. Clinical Course as of 06/10/24 0657  Fri Jun 01, 2024  1857 Creatinine(!): 1.86 Seems to be at baseline for the patient [HD]  1857 CT HEAD WO CONTRAST Unremarkable on my independent review interpretation [HD]  Sat Jun 02, 2024  0015 I independently viewed and interpreted the  patient's CTA head and neck and I could not appreciate any gross abnormalities or evidence of LVO.  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  I consulted by phone with Dr. Clois and explained the clinical picture and the radiographic findings.  He said that an aneurysm in that location should not cause the acute blindness in the right eye and that there is no need for urgent transfer and that we can continue with admission to the hospital service for stroke workup. [CF]  0020 I am consulting the hospitalist team for admission.   [CF]  0024 I reassessed the patient and talked with him about the results.  He said that the vision is coming back in his right eye but still not at baseline.  I explained the plan to admit him to the hospitalist service for further stroke evaluation and he understands and agrees.  We will do a stroke swallow  screen and I will give him a full dose aspirin .  He is not having any difficulty speaking or swallowing. [CF]  0040 I consulted by phone with the admitting hospitalist, and they will admit the patient - Dr. Lawence [CF]  [phone] Clarification, patient already passed stroke swallow screen, so I ordered 324 mg p.o. aspirin .  Patient's creatinine is 1.86 but this appears to be his baseline. [CF]    Clinical Course User Index [CF] Gordan Huxley, MD [HD] Nicholaus Rolland BRAVO, MD   FINAL CLINICAL IMPRESSION(S) / ED DIAGNOSES   Final diagnoses:  Monocular vision loss   Rx / DC Orders   ED Discharge Orders     None      Note:  This document was prepared using Dragon voice recognition software and may include unintentional dictation errors.   Jossie Artist POUR, MD 06/10/24 [phone]

## 2024-06-01 NOTE — ED Provider Notes (Signed)
 I was asked to place orders on this patient given a prescription from [facility] that reads CRAO right eye onset more than 24 hours ago recommend ED evaluation for stroke workup include ESR CRP to rule out GCA.  I have placed orders and reviewed the blood work suspects a fall but have not actually had the chance yet to evaluate this patient.  There has been a lack of available rooms given how many patients are in the emergency department.  Anticipate this patient will need to be seen by an oncoming physician primarily   Nicholaus Rolland BRAVO, MD 06/01/24 2109

## 2024-06-01 NOTE — ED Triage Notes (Signed)
 Patient states blurred vision to right eye since yesterday at 1400; today went to [facility] and was sent over for a stroke workup.

## 2024-06-02 ENCOUNTER — Observation Stay

## 2024-06-02 DIAGNOSIS — I639 Cerebral infarction, unspecified: Secondary | ICD-10-CM | POA: Diagnosis present

## 2024-06-02 DIAGNOSIS — H5460 Unqualified visual loss, one eye, unspecified: Principal | ICD-10-CM | POA: Diagnosis present

## 2024-06-02 DIAGNOSIS — G453 Amaurosis fugax: Secondary | ICD-10-CM | POA: Insufficient documentation

## 2024-06-02 HISTORY — DX: Cerebral infarction, unspecified: I63.9

## 2024-06-02 LAB — URINALYSIS, COMPLETE (UACMP) WITH MICROSCOPIC
Bacteria, UA: NONE SEEN
Bilirubin Urine: NEGATIVE
Glucose, UA: NEGATIVE mg/dL
Hgb urine dipstick: NEGATIVE
Ketones, ur: NEGATIVE mg/dL
Leukocytes,Ua: NEGATIVE
Nitrite: NEGATIVE
Protein, ur: NEGATIVE mg/dL
Specific Gravity, Urine: 1.025 (ref 1.005–1.030)
Squamous Epithelial / HPF: 0 /HPF (ref 0–5)
pH: 6 (ref 5.0–8.0)

## 2024-06-02 LAB — GLUCOSE, CAPILLARY: Glucose-Capillary: 72 mg/dL (ref 70–99)

## 2024-06-02 LAB — URINE DRUG SCREEN, QUALITATIVE (ARMC ONLY)
Amphetamines, Ur Screen: NOT DETECTED
Barbiturates, Ur Screen: NOT DETECTED
Benzodiazepine, Ur Scrn: NOT DETECTED
Cannabinoid 50 Ng, Ur ~~LOC~~: NOT DETECTED
Cocaine Metabolite,Ur ~~LOC~~: NOT DETECTED
MDMA (Ecstasy)Ur Screen: NOT DETECTED
Methadone Scn, Ur: NOT DETECTED
Opiate, Ur Screen: NOT DETECTED
Phencyclidine (PCP) Ur S: NOT DETECTED
Tricyclic, Ur Screen: NOT DETECTED

## 2024-06-02 LAB — CBG MONITORING, ED: Glucose-Capillary: 126 mg/dL — ABNORMAL HIGH (ref 70–99)

## 2024-06-02 LAB — C-REACTIVE PROTEIN: CRP: 0.6 mg/dL (ref <1.0)

## 2024-06-02 MED ORDER — INSULIN ASPART 100 UNIT/ML IJ SOLN
0.0000 [IU] | Freq: Three times a day (TID) | INTRAMUSCULAR | Status: DC
  Administered 2024-06-02: 5 [IU] via SUBCUTANEOUS
  Administered 2024-06-03: 2 [IU] via SUBCUTANEOUS
  Filled 2024-06-02 (×2): qty 1

## 2024-06-02 MED ORDER — ASPIRIN 81 MG PO TBEC
81.0000 mg | DELAYED_RELEASE_TABLET | Freq: Every day | ORAL | Status: DC
  Administered 2024-06-02 – 2024-06-03 (×2): 81 mg via ORAL
  Filled 2024-06-02 (×2): qty 1

## 2024-06-02 MED ORDER — ATORVASTATIN CALCIUM 20 MG PO TABS
40.0000 mg | ORAL_TABLET | Freq: Every day | ORAL | Status: DC
  Administered 2024-06-02: 40 mg via ORAL
  Filled 2024-06-02: qty 2

## 2024-06-02 MED ORDER — PANTOPRAZOLE SODIUM 40 MG PO TBEC
40.0000 mg | DELAYED_RELEASE_TABLET | Freq: Every day | ORAL | Status: DC
  Administered 2024-06-02 – 2024-06-03 (×2): 40 mg via ORAL
  Filled 2024-06-02 (×2): qty 1

## 2024-06-02 MED ORDER — ALBUTEROL SULFATE (2.5 MG/3ML) 0.083% IN NEBU: 3.0000 mL | INHALATION_SOLUTION | Freq: Four times a day (QID) | RESPIRATORY_TRACT | Status: DC | PRN

## 2024-06-02 MED ORDER — TORSEMIDE 20 MG PO TABS
10.0000 mg | ORAL_TABLET | Freq: Two times a day (BID) | ORAL | Status: DC
  Administered 2024-06-02 – 2024-06-03 (×3): 10 mg via ORAL
  Filled 2024-06-02 (×3): qty 1

## 2024-06-02 MED ORDER — FINASTERIDE 5 MG PO TABS
5.0000 mg | ORAL_TABLET | Freq: Every day | ORAL | Status: DC
  Administered 2024-06-02 – 2024-06-03 (×2): 5 mg via ORAL
  Filled 2024-06-02 (×3): qty 1

## 2024-06-02 MED ORDER — TACROLIMUS 0.5 MG PO CAPS
0.5000 mg | ORAL_CAPSULE | Freq: Every day | ORAL | Status: DC
  Administered 2024-06-02: 0.5 mg via ORAL
  Filled 2024-06-02: qty 1

## 2024-06-02 MED ORDER — SENNOSIDES-DOCUSATE SODIUM 8.6-50 MG PO TABS: 1.0000 | ORAL_TABLET | Freq: Every evening | ORAL | Status: DC | PRN

## 2024-06-02 MED ORDER — ASPIRIN 81 MG PO CHEW
324.0000 mg | CHEWABLE_TABLET | Freq: Once | ORAL | Status: AC
Start: 2024-06-02 — End: 2024-06-02
  Administered 2024-06-02: 324 mg via ORAL
  Filled 2024-06-02: qty 4

## 2024-06-02 MED ORDER — TAMSULOSIN HCL 0.4 MG PO CAPS
0.8000 mg | ORAL_CAPSULE | Freq: Every day | ORAL | Status: DC
  Administered 2024-06-02 – 2024-06-03 (×2): 0.8 mg via ORAL
  Filled 2024-06-02 (×2): qty 2

## 2024-06-02 MED ORDER — INSULIN GLARGINE-YFGN 100 UNIT/ML ~~LOC~~ SOLN
20.0000 [IU] | Freq: Every day | SUBCUTANEOUS | Status: DC
  Administered 2024-06-02: 20 [IU] via SUBCUTANEOUS
  Filled 2024-06-02 (×2): qty 0.2

## 2024-06-02 MED ORDER — LORAZEPAM 0.5 MG PO TABS: 0.5000 mg | ORAL_TABLET | Freq: Four times a day (QID) | ORAL | Status: DC | PRN

## 2024-06-02 MED ORDER — STROKE: EARLY STAGES OF RECOVERY BOOK: Freq: Once | Status: AC

## 2024-06-02 MED ORDER — ACETAMINOPHEN 160 MG/5ML PO SOLN: 650.0000 mg | ORAL | Status: DC | PRN

## 2024-06-02 MED ORDER — MYCOPHENOLATE MOFETIL 250 MG PO CAPS
500.0000 mg | ORAL_CAPSULE | Freq: Two times a day (BID) | ORAL | Status: DC
  Administered 2024-06-02 – 2024-06-03 (×3): 500 mg via ORAL
  Filled 2024-06-02 (×4): qty 2

## 2024-06-02 MED ORDER — HYDRALAZINE HCL 20 MG/ML IJ SOLN: 5.0000 mg | Freq: Four times a day (QID) | INTRAMUSCULAR | Status: DC | PRN

## 2024-06-02 MED ORDER — APIXABAN 5 MG PO TABS
5.0000 mg | ORAL_TABLET | Freq: Two times a day (BID) | ORAL | Status: DC
  Administered 2024-06-02 – 2024-06-03 (×3): 5 mg via ORAL
  Filled 2024-06-02 (×3): qty 1

## 2024-06-02 MED ORDER — TACROLIMUS 1 MG PO CAPS
1.0000 mg | ORAL_CAPSULE | Freq: Every day | ORAL | Status: DC
  Administered 2024-06-02 – 2024-06-03 (×2): 1 mg via ORAL
  Filled 2024-06-02 (×3): qty 1

## 2024-06-02 MED ORDER — SODIUM CHLORIDE 0.9 % IV SOLN: INTRAVENOUS | Status: AC

## 2024-06-02 MED ORDER — PHENOBARBITAL SODIUM 130 MG/ML IJ SOLN: 130.0000 mg | Freq: Once | INTRAMUSCULAR | Status: DC

## 2024-06-02 MED ORDER — LINAGLIPTIN 5 MG PO TABS
5.0000 mg | ORAL_TABLET | Freq: Every day | ORAL | Status: DC
  Administered 2024-06-02 – 2024-06-03 (×2): 5 mg via ORAL
  Filled 2024-06-02 (×3): qty 1

## 2024-06-02 MED ORDER — ACETAMINOPHEN 650 MG RE SUPP: 650.0000 mg | RECTAL | Status: DC | PRN

## 2024-06-02 MED ORDER — ACETAMINOPHEN 325 MG PO TABS: 650.0000 mg | ORAL_TABLET | ORAL | Status: DC | PRN

## 2024-06-02 MED ORDER — AMIODARONE HCL 200 MG PO TABS
200.0000 mg | ORAL_TABLET | Freq: Every day | ORAL | Status: DC
  Administered 2024-06-02 – 2024-06-03 (×2): 200 mg via ORAL
  Filled 2024-06-02 (×2): qty 1

## 2024-06-02 NOTE — ED Notes (Signed)
 Last neuro check done at 0139. Mansy, MD sent secure message @246  changing NIHSS frequency to Q4h.

## 2024-06-02 NOTE — Progress Notes (Signed)
 MRI negative.  D/W neurology, who recommend we continue Eliquis  and ASA.

## 2024-06-02 NOTE — ED Provider Notes (Signed)
-----------------------------------------   12:12 AM on 06/02/2024 -----------------------------------------  Assuming care from Dr. Jossie.  In short, [name] is a 68 y.o. male with a chief complaint of loss of vision in right eye within the last 24 hours.  Refer to the original H&P for additional details.  The current plan of care is to follow up CTA head and neck and admit the patient for stroke workup in the absence of any condition requiring transfer.   Clinical Course as of 06/02/24 0044  Fri Jun 01, 2024  1857 Creatinine(!): 1.86 Seems to be at baseline for the patient [HD]  1857 CT HEAD WO CONTRAST Unremarkable on my independent review interpretation [HD]  Sat Jun 02, 2024  0015 I independently viewed and interpreted the patient's CTA head and neck and I could not appreciate any gross abnormalities or evidence of LVO.  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  I consulted by phone with Dr. Clois and explained the clinical picture and the radiographic findings.  He said that an aneurysm in that location should not cause the acute blindness in the right eye and that there is no need for urgent transfer and that we can continue with admission to the hospital service for stroke workup. [CF]  0020 I am consulting the hospitalist team for admission.   [CF]  0024 I reassessed the patient and talked with him about the results.  He said that the vision is coming back in his right eye but still not at baseline.  I explained the plan to admit him to the hospitalist service for further stroke evaluation and he understands and agrees.  We will do a stroke swallow screen and I will give him a full dose aspirin .  He is not having any difficulty speaking or swallowing. [CF]  0040 I consulted by phone with the admitting hospitalist, and they will admit the patient - Dr. Lawence [CF]  [phone] Clarification, patient already passed  stroke swallow screen, so I ordered 324 mg p.o. aspirin .  Patient's creatinine is 1.86 but this appears to be his baseline. [CF]    Clinical Course User Index [CF] Gordan Huxley, MD [HD] Nicholaus Rolland BRAVO, MD     Medications  iohexol  (OMNIPAQUE ) 300 MG/ML solution 60 mL (60 mLs Intravenous Contrast Given 06/01/24 2305)  aspirin  chewable tablet 324 mg (324 mg Oral Given 06/02/24 0039)     ED Discharge Orders     None      Final diagnoses:  Monocular vision loss  Stroke-like symptoms  Chronic kidney disease, unspecified CKD stage     Gordan Huxley, MD 06/02/24 [phone]

## 2024-06-02 NOTE — Care Management Obs Status (Signed)
 MEDICARE OBSERVATION STATUS NOTIFICATION   Patient Details  Name: [name] MRN: [id number] Date of Birth: 1956/04/17   Medicare Observation Status Notification Given:  Yes    Rojelio [name] 06/02/2024, 1:42 PM

## 2024-06-02 NOTE — Progress Notes (Signed)
 SLP Cancellation Note  Patient Details Name: [name] MRN: [id number] DOB: 1955-10-22   Cancelled treatment:       Reason Eval/Treat Not Completed: SLP screened, no needs identified, will sign off  Pt in the room with spouse and daughter. All denied cognitive communication concerns- reporting sole concern is vision. Pt answering all questions without issue, attentive, and oriented. No further SLP services indicated at this time.   Amri Lien Clapp, MS, CCC-SLP Speech Language Pathologist Rehab Services; [facility] [phone] (ascom)    [name] 06/02/2024, 11:18 AM

## 2024-06-02 NOTE — H&P (Signed)
 History and Physical    [name] FMW:[id number] DOB: 02/17/1956 DOA: 06/01/2024  PCP: Abbey Bruckner, MD (Confirm with patient/family/NH records and if not entered, this has to be entered at [facility] point of entry) Patient coming from: Home  I have personally briefly reviewed patient's old medical records in [facility]  Chief Complaint: Lost vision on right eye  HPI: [name] is a 68 y.o. male with medical history significant of PAF on Eliquis , HTN, chronic HFrEF, CKD stage IIIb, kidney transplant, nonobstructive multivessel CAD, IDDM, HLD, presented with sudden loss of right eye vision.  Symptoms started yesterday afternoon, after lunch, patient suddenly developed  dark spot in the center on the right eye, gradually right-sided vision even got worse and this morning time only discern light and darkness on the right side.  He went to see ophthalmology yesterday afternoon who suspected patient had a right central retinal artery occlusion and sent the patient to ED for stroke workup.  Patient reported that he has been compliant with all his medication including twice daily Eliquis .  He denied any numbness weakness of any of the limbs no headaches.  ED Course: Afebrile, borderline bradycardia blood pressure 147/62 O2 saturation 98% on room air.  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  Image was reviewed by neurosurgery Dr. Clois, who does not consider the incidental finding related to patient's symptoms.  Neurosurgery recommend stroke workup.  Review of Systems: As per HPI otherwise 14 point review of systems negative.    Past Medical History:  Diagnosis Date   Acute exacerbation of CHF (congestive heart failure) (HCC) 12/16/2023   Acute on chronic congestive heart failure (HCC) 11/03/2023   Adenomatous polyp of colon  01/30/2024   AKI (acute kidney injury) 12/16/2023   Aortic atherosclerosis    Arthritis    BPH with LUTS s/p UroLift with Dr. Penne in November 2024 on finasteride , Flomax , and Sanctura   and sees [facility]    BPH with LUTS, s/p urolift with Dr. Penne 06/2023, on Finasteride , Flomax , Sancture, sees    Carotid arterial disease    a.) s/p LEFT CEA on 09/06/2019   Chest pain 09/20/2017   Colon cancer screening 01/30/2024   Coronary artery disease    a.) MI with stents x 2 in 2004; b.) MI with stent x 1 2005; c.) s/p CABG x 2 06/02/2006 (LIMA-LAD, SVG-PDA); d.) LHC/PCI 08/25/2007: 80% oD1 (2.5 x 12 mm Xience V DES)   DDD (degenerative disc disease), lumbar    Dyspepsia 01/30/2024   Dyspnea    ESRD (end stage renal disease) (HCC)    a.) s/p RIGHT renal 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/2015   GERD (gastroesophageal reflux disease)    History of blood transfusion    Hospitalization within last 30 days 06/03/2020   Hypertension    Long term current use of aspirin     Long term current use of immunosuppressive drug    a.) mycophenolate  + tacrolimus    Myocardial infarction [facility]) 2004   a.) details unclear; stents x 2 (unknown type/location)   Myocardial infarction (HCC) 2005   a.) details unclear; stent x 1 (unknown type/location)   On apixaban  therapy    Orthopnea 12/16/2023   PAF (paroxysmal atrial fibrillation) (HCC)    a.) CHA2DS2-VASc = 5 (age, HTN, vascular disease history/MI, T2DM) as of 06/10/2023; b.) cardiac rate/rhythm maintained  intrinsically without pharmacological intervention; chronically anticoagulated using apixaban    Sepsis due to gram-negative UTI (HCC) 04/27/2020   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   From DM and HTN, f/u with [facility] every 6 months   Subconjunctival hemorrhage of right eye 12/15/2023   T2DM (type 2 diabetes mellitus) (HCC)    Unstable angina (HCC)    Vitamin D deficiency     Past Surgical History:  Procedure Laterality Date   CATARACT EXTRACTION  Bilateral    CATARACT EXTRACTION W/PHACO Right 02/03/2017   Procedure: CATARACT EXTRACTION PHACO AND INTRAOCULAR LENS PLACEMENT (IOC);  Surgeon: Mittie Gaskin, MD;  Location: [facility] ORS;  Service: Ophthalmology;  Laterality: Right;  US  00:35.8 AP% 12.8 CDE 4.57 Fluid lot # [id number]   COLONOSCOPY N/A 01/30/2024   Procedure: COLONOSCOPY;  Surgeon: Therisa Bi, MD;  Location: [facility] ENDOSCOPY;  Service: Gastroenterology;  Laterality: N/A;  IDDM   COLONOSCOPY WITH PROPOFOL  N/A 12/30/2021   Procedure: COLONOSCOPY WITH PROPOFOL ;  Surgeon: Therisa Bi, MD;  Location: [facility] ENDOSCOPY;  Service: Gastroenterology;  Laterality: N/A;   CORONARY ANGIOPLASTY WITH STENT PLACEMENT Left 2004   stents x 2   CORONARY ANGIOPLASTY WITH STENT PLACEMENT Left 2005   stent x 1   CORONARY ANGIOPLASTY WITH STENT PLACEMENT Left 08/25/2007   Procedure: CORONARY ANGIOPLASTY WITH STENT PLACEMENT; Location: [facility]; Surgeon: Valinda Cage, MD   CORONARY ARTERY BYPASS GRAFT N/A 06/08/2006   Procedure: CORONARY ARTERY BYPASS GRAFT; Location: [facility]; Surgeon: Maude Sharps, MD   CYSTOSCOPY WITH INSERTION OF UROLIFT N/A 06/13/2023   Procedure: CYSTOSCOPY WITH INSERTION OF UROLIFT;  Surgeon: Penne Knee, MD;  Location: [facility] ORS;  Service: Urology;  Laterality: N/A;   ENDARTERECTOMY Left 09/06/2019   Procedure: ENDARTERECTOMY CAROTID;  Surgeon: Marea Selinda RAMAN, MD;  Location: [facility] ORS;  Service: Vascular;  Laterality: Left;   ESOPHAGOGASTRODUODENOSCOPY N/A 01/30/2024   Procedure: EGD (ESOPHAGOGASTRODUODENOSCOPY);  Surgeon: Therisa Bi, MD;  Location: [facility] ENDOSCOPY;  Service: Gastroenterology;  Laterality: N/A;   HIP FRACTURE SURGERY     KIDNEY TRANSPLANT Right 05/10/2015   LEFT HEART CATH AND CORONARY ANGIOGRAPHY Left 05/20/2006   Procedure: LEFT HEART CATH AND CORONARY ANGIOGRAPHY; Location: [facility]; Surgeon: Denyse Bathe, MD   LEFT HEART CATH AND CORONARY ANGIOGRAPHY Left 11/08/2023   Procedure: LEFT HEART CATH AND CORONARY  ANGIOGRAPHY with possible intervention;  Surgeon: Bathe Denyse LABOR, MD;  Location: [facility] INVASIVE CV LAB;  Service: Cardiovascular;  Laterality: Left;   LEFT HEART CATH AND CORS/GRAFTS ANGIOGRAPHY Left 08/10/2007   Procedure: LEFT HEART CATH AND CORS/GRAFTS ANGIOGRAPHY; Location: [facility]; Surgeon: Denyse Bathe, MD   LEFT HEART CATH AND CORS/GRAFTS ANGIOGRAPHY Left 05/04/2011   Procedure: LEFT HEART CATH AND CORS/GRAFTS ANGIOGRAPHY; Location: [facility]; Surgeon: Denyse Bathe, MD   POLYPECTOMY  01/30/2024   Procedure: POLYPECTOMY, INTESTINE;  Surgeon: Therisa Bi, MD;  Location: [facility] ENDOSCOPY;  Service: Gastroenterology;;     reports that he has quit smoking. He has never used smokeless tobacco. He reports that he does not drink alcohol and does not use drugs.  Allergies  Allergen Reactions   Oxybutynin  Other (See Comments)    Incomplete bladder emptying   Trospium  Chloride Other (See Comments)    Urinary Retention    Family History  Problem Relation Age of Onset   Osteoporosis Mother    Drug abuse Sister    Drug abuse Brother     Prior to Admission medications   Medication Sig Start Date End Date Taking? Authorizing Provider  amiodarone  (PACERONE ) 200 MG tablet Take 1 tablet (  200 mg total) by mouth daily. 01/05/24  Yes Fernand Denyse LABOR, MD  ascorbic acid (VITAMIN C) 500 MG tablet Take 500 mg by mouth daily.   Yes [provider]  aspirin  EC 81 MG EC tablet Take 1 tablet (81 mg total) by mouth daily at 6 (six) AM. 09/08/19  Yes Stegmayer, Suzen LABOR, PA-C  atorvastatin  (LIPITOR) 40 MG tablet Take 40 mg by mouth at bedtime. 12/23/14  Yes [provider]  CELLCEPT  250 MG capsule Take 500 mg by mouth 2 (two) times daily. 08/12/21  Yes [provider]  Cholecalciferol  (VITAMIN D) 50 MCG (2000 UT) CAPS Take 2,000 Units by mouth daily.   Yes [provider]  ELIQUIS  5 MG TABS tablet Take 5 mg by mouth 2 (two) times daily.  04/21/18  Yes [provider]   finasteride  (PROSCAR ) 5 MG tablet Take 1 tablet (5 mg total) by mouth daily. 01/24/24  Yes McGowan, Clotilda A, PA-C  guaiFENesin -codeine 100-10 MG/5ML syrup Take 5 mLs by mouth every 6 (six) hours as needed. 05/04/24  Yes Paduchowski, Franky, MD  HUMALOG KWIKPEN 100 UNIT/ML KiwkPen Inject 5-10 Units into the skin 3 (three) times daily. Inject 10 units daily at breakfast, 10 units at lunch and up to 10 units or less at supper per SSI   Yes [provider]  hydrALAZINE  (APRESOLINE ) 25 MG tablet Take 1 tablet (25 mg total) by mouth 2 (two) times daily. 01/23/24 01/22/25 Yes Fernand Denyse LABOR, MD  insulin  glargine (LANTUS  SOLOSTAR) 100 UNIT/ML Solostar Pen Inject 30 Units into the skin at bedtime. 12/22/23  Yes [provider]  linagliptin  (TRADJENTA ) 5 MG TABS tablet Take 1 tablet (5 mg total) by mouth daily. 02/21/24  Yes Bair, Kalpana, MD  losartan  (COZAAR ) 100 MG tablet Take 1 tablet (100 mg total) by mouth daily. 11/11/23 11/10/24 Yes Fernand Denyse LABOR, MD  nitroGLYCERIN  (NITROSTAT ) 0.4 MG SL tablet Place 1 tablet (0.4 mg total) under the tongue every 5 (five) minutes as needed for chest pain. 11/11/23 11/10/24 Yes Fernand Denyse LABOR, MD  omeprazole (PRILOSEC) 20 MG capsule Take 20 mg by mouth daily.   Yes [provider]  PROGRAF  1 MG capsule Take 1 mg by mouth every morning. 08/11/16  Yes [provider]  tacrolimus  (PROGRAF ) 0.5 MG capsule Take 0.5 mg by mouth at bedtime. 09/08/23  Yes [provider]  tamsulosin  (FLOMAX ) 0.4 MG CAPS capsule Take 2 capsules (0.8 mg total) by mouth daily. 03/06/24  Yes McGowan, Clotilda A, PA-C  torsemide (DEMADEX) 10 MG tablet Take 1 tablet (10 mg total) by mouth 2 (two) times daily. 05/11/24 05/11/25 Yes Fernand Denyse LABOR, MD  albuterol (VENTOLIN HFA) 108 (90 Base) MCG/ACT inhaler Inhale 2 puffs into the lungs every 6 (six) hours as needed for wheezing or shortness of breath. Patient not taking: Reported on 06/02/2024 05/04/24   Paduchowski,  Kevin, MD  amoxicillin-clavulanate (AUGMENTIN) 875-125 MG tablet Take 1 tablet by mouth 2 (two) times daily. Patient not taking: Reported on 06/02/2024 05/11/24   Fernand Denyse LABOR, MD  Continuous Blood Gluc Sensor (FREESTYLE LIBRE 2 SENSOR) MISC by Does not apply route.    [provider]  Continuous Glucose Sensor (FREESTYLE LIBRE 2 PLUS SENSOR) MISC 1 each by Other route every 14 (fourteen) days. 12/22/23   [provider]  Insulin  Pen Needle 33G X 4 MM MISC To use with insulin  pen 4 times per day. E 13.9 02/19/16   [provider]  Physical Exam: Vitals:   06/02/24 0400 06/02/24 0532 06/02/24 0700 06/02/24 0709  BP: (!) 147/62 (!) 169/56 (!) 123/53   Pulse: (!) 53 (!) 48 70   Resp: 16 14    Temp:  97.7 F (36.5 C)    TempSrc:  Oral    SpO2: 98% 99% 96% 98%    Constitutional: NAD, calm, comfortable Vitals:   06/02/24 0400 06/02/24 0532 06/02/24 0700 06/02/24 0709  BP: (!) 147/62 (!) 169/56 (!) 123/53   Pulse: (!) 53 (!) 48 70   Resp: 16 14    Temp:  97.7 F (36.5 C)    TempSrc:  Oral    SpO2: 98% 99% 96% 98%   Eyes: PERRL, lids and conjunctivae normal ENMT: Mucous membranes are moist. Posterior pharynx clear of any exudate or lesions.Normal dentition.  Neck: normal, supple, no masses, no thyromegaly Respiratory: clear to auscultation bilaterally, no wheezing, no crackles. Normal respiratory effort. No accessory muscle use.  Cardiovascular: Regular rate and rhythm, no murmurs / rubs / gallops. No extremity edema. 2+ pedal pulses. No carotid bruits.  Abdomen: no tenderness, no masses palpated. No hepatosplenomegaly. Bowel sounds positive.  Musculoskeletal: no clubbing / cyanosis. No joint deformity upper and lower extremities. Good ROM, no contractures. Normal muscle tone.  Skin: no rashes, lesions, ulcers. No induration Neurologic: Decreased vision on right eye. Sensation intact, DTR normal. Strength 5/5 in all 4.  Psychiatric: Normal judgment and  insight. Alert and oriented x 3. Normal mood.    Labs on Admission: I have personally reviewed following labs and imaging studies  CBC: Recent Labs  Lab 06/01/24 1744  WBC 7.1  NEUTROABS 5.2  HGB 14.0  HCT 43.3  MCV 94.5  PLT 162   Basic Metabolic Panel: Recent Labs  Lab 06/01/24 1744  NA 132*  K 4.0  CL 100  CO2 22  GLUCOSE 192*  BUN 41*  CREATININE 1.86*  CALCIUM  8.3*   GFR: Estimated Creatinine Clearance: 36.3 mL/min (A) (by C-G formula based on SCr of 1.86 mg/dL (H)). Liver Function Tests: Recent Labs  Lab 06/01/24 1744  AST 30  ALT 30  ALKPHOS 45  BILITOT 1.1  PROT 7.3  ALBUMIN 4.1   No results for input(s): LIPASE, AMYLASE in the last 168 hours. No results for input(s): AMMONIA in the last 168 hours. Coagulation Profile: Recent Labs  Lab 06/01/24 1734  INR 1.5*   Cardiac Enzymes: No results for input(s): CKTOTAL, CKMB, CKMBINDEX, TROPONINI in the last 168 hours. BNP (last 3 results) Recent Labs    05/01/24 1246  PROBNP 461.0*   HbA1C: No results for input(s): HGBA1C in the last 72 hours. CBG: Recent Labs  Lab 06/02/24 0026  GLUCAP 126*   Lipid Profile: No results for input(s): CHOL, HDL, LDLCALC, TRIG, CHOLHDL, LDLDIRECT in the last 72 hours. Thyroid Function Tests: No results for input(s): TSH, T4TOTAL, FREET4, T3FREE, THYROIDAB in the last 72 hours. Anemia Panel: No results for input(s): VITAMINB12, FOLATE, FERRITIN, TIBC, IRON, RETICCTPCT in the last 72 hours. Urine analysis:    Component Value Date/Time   COLORURINE YELLOW (A) 05/21/2024 1228   APPEARANCEUR CLEAR (A) 05/21/2024 1228   APPEARANCEUR Clear 12/19/2023 0939   LABSPEC 1.018 05/21/2024 1228   PHURINE 5.0 05/21/2024 1228   GLUCOSEU 50 (A) 05/21/2024 1228   HGBUR NEGATIVE 05/21/2024 1228   BILIRUBINUR NEGATIVE 05/21/2024 1228   BILIRUBINUR Negative 12/19/2023 0939   KETONESUR NEGATIVE 05/21/2024 1228   PROTEINUR  NEGATIVE 05/21/2024 1228   UROBILINOGEN 1.0  12/16/2010 1357   NITRITE NEGATIVE 05/21/2024 1228   LEUKOCYTESUR NEGATIVE 05/21/2024 1228    Radiological Exams on Admission: CT ANGIO HEAD NECK W WO CM Result Date: 06/01/2024 CLINICAL DATA:  Initial evaluation for acute visual loss. EXAM: CT ANGIOGRAPHY HEAD AND NECK WITH AND WITHOUT CONTRAST TECHNIQUE: Multidetector CT imaging of the head and neck was performed using the standard protocol during bolus administration of intravenous contrast. Multiplanar CT image reconstructions and MIPs were obtained to evaluate the vascular anatomy. Carotid stenosis measurements (when applicable) are obtained utilizing NASCET criteria, using the distal internal carotid diameter as the denominator. RADIATION DOSE REDUCTION: This exam was performed according to the departmental dose-optimization program which includes automated exposure control, adjustment of the mA and/or kV according to patient size and/or use of iterative reconstruction technique. CONTRAST:  60mL OMNIPAQUE  IOHEXOL  300 MG/ML  SOLN COMPARISON:  CT from earlier the same day. FINDINGS: CTA NECK FINDINGS Aortic arch: Visualized aortic arch within normal limits for caliber with standard branch pattern. Moderate aortic atherosclerosis. Sequelae of prior CABG noted. No significant stenosis about the origin the great vessels. Right carotid system: Right common and internal carotid arteries are patent without dissection. Atheromatous change about the right carotid bulb with associated stenosis of up to 55% by NASCET criteria. Left carotid system: Left common and internal carotid arteries are patent without dissection. Mild atheromatous change about the left common carotid artery without hemodynamically significant greater than 50% stenosis. Vertebral arteries: Both vertebral arteries arise from subclavian arteries. 55% stenosis noted involving the proximal left subclavian artery prior to the takeoff of the left  vertebral artery (series 6, image 299). Left vertebral artery dominant. Atheromatous change at the origin of the right vertebral artery with severe stenosis. Vertebral arteries otherwise patent without stenosis or dissection. Skeleton: No worrisome osseous lesions. Moderate spondylosis at C4-5 through C6-7. Prior sternotomy noted. Other neck: No other acute finding. Upper chest: No other acute finding. Review of the MIP images confirms the above findings CTA HEAD FINDINGS Anterior circulation: Atheromatous change about the carotid siphons bilaterally, left worse than right. No hemodynamically significant stenosis about the right siphon. Moderate to severe stenosis at the para clinoid left ICA. A1 segments patent bilaterally. Right A1 dominant. Normal anterior communicating artery complex. Anterior cerebral arteries patent without visible stenosis. No M1 stenosis or occlusion. 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 No proximal MCA branch occlusion or high-grade stenosis. Distal MCA branches perfused and symmetric. Posterior circulation: Both V4 segments patent without stenosis. Both PICA patent. Basilar patent without stenosis. Superior cerebral arteries patent bilaterally. Left PCA supplied via the basilar. Predominant fetal type origin of the right PCA. Both PCAs patent to their distal aspects without stenosis. A small patent allowing for timing the contrast bolus. Venous sinuses: Patent allowing for timing the contrast bolus. Anatomic variants: Fetal type right PCA. Review of the MIP images confirms the above findings IMPRESSION: 1. Negative CTA for large vessel occlusion or other emergent finding. 2. 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 Neuroendovascular consultation and referral suggested. 3. Atheromatous change about the right carotid bulb with associated  stenosis of up to 55% by NASCET criteria. 4. Atheromatous change about the carotid siphons with associated moderate to severe stenosis at the para clinoid left ICA. 5. 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 6. 55% stenosis involving  the proximal left subclavian artery prior to the takeoff of the left vertebral artery. Aortic Atherosclerosis (ICD10-I70.0). Electronically Signed   By: Morene Hoard M.D.   On: 06/01/2024 23:43   CT HEAD WO CONTRAST Result Date: 06/01/2024 EXAM: CT HEAD WITHOUT CONTRAST 06/01/2024 06:08:19 PM TECHNIQUE: CT of the head was performed without the administration of intravenous contrast. Automated exposure control, iterative reconstruction, and/or weight based adjustment of the mA/kV was utilized to reduce the radiation dose to as low as reasonably achievable. COMPARISON: 10/19/2010 CLINICAL HISTORY: Neuro deficit, acute, stroke suspected. Blurred vision. FINDINGS: BRAIN AND VENTRICLES: No acute hemorrhage. No evidence of acute infarct. No hydrocephalus. No extra-axial collection. No mass effect or midline shift. There is diffuse cerebral atrophy and chronic small vessel disease throughout the deep white matter. ORBITS: No acute abnormality. SINUSES: No acute abnormality. SOFT TISSUES AND SKULL: No acute soft tissue abnormality. No skull fracture. IMPRESSION: 1. No acute intracranial abnormality. 2. Diffuse cerebral atrophy and chronic small vessel disease throughout the deep white matter. Electronically signed by: Franky Crease MD 06/01/2024 06:20 PM EDT RP Workstation: [id number]    EKG: Independently reviewed.  Sinus rhythm, no acute ST changes.  Assessment/Plan Principal Problem:   Monocular vision loss Active Problems:   Amaurosis fugax of right eye   Stroke [facility])  (please populate well all problems here in Problem List. (For example, if patient is on BP meds at home and you resume or decide to hold them, it is a problem that needs to be her.  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 - Allow permissive hypertension today, hold off home dose of hydralazine , and losartan , continue torsemide twice daily - Continue Eliquis , aspirin  and statin - OT evaluation - Other DDx, ED physician also checked ESR which is normal, GCA unlikely.  PAF - Continue Eliquis   HTN Chronic HFrEF - Euvolemic, BP and CHF medication adjustment as above.  IDDM - Cut down Lantus  to 20 units daily SSI-  CKD stage IIIb -Euvolemic, creatinine level stable - Continue torsemide - Other BP meds on hold as above  History of kidney transplant - Creatinine level stable - Continue CellCept  and Prograf   DVT prophylaxis: Eliquis  Code Status: Full code Family Communication: Wife at bedside Disposition Plan: Expect less than 2 midnight hospital stay Consults called: Curbside consult neurosurgery by ED physician Admission status: Telemetry observation   Cort ONEIDA Mana MD [facility] Pager [phone]  06/02/2024, 9:17 AM

## 2024-06-02 NOTE — ED Notes (Signed)
 ED TO INPATIENT HANDOFF REPORT  ED Nurse Name and Phone #: [phone]  S Name/Age/Gender [name] 68 y.o. male Room/Bed: ED04A/ED04A  Code Status   Code Status: Prior  Home/SNF/Other Home Patient oriented to: self, place, time, and situation Is this baseline? Yes   Triage Complete: Triage complete  Chief Complaint Monocular vision loss [H54.60]  Triage Note Patient states blurred vision to right eye since yesterday at 1400; today went to [facility] and was sent over for a stroke workup.    Allergies Allergies  Allergen Reactions   Oxybutynin  Other (See Comments)    Incomplete bladder emptying   Trospium  Chloride Other (See Comments)    Urinary Retention    Level of Care/Admitting Diagnosis ED Disposition     ED Disposition  Admit   Condition  --   Comment  Hospital Area: [facility] [[id number]]  Level of Care: Telemetry [5]  Diagnosis: Monocular vision loss [[id number]]  Admitting Physician: LAWENCE MADISON LABOR [[id number]]  Attending Physician: LAWENCE MADISON LABOR [[id number]]          B Medical/Surgery History Past Medical History:  Diagnosis Date   Acute exacerbation of CHF (congestive heart failure) (HCC) 12/16/2023   Acute on chronic congestive heart failure (HCC) 11/03/2023   Adenomatous polyp of colon 01/30/2024   AKI (acute kidney injury) 12/16/2023   Aortic atherosclerosis    Arthritis    BPH with LUTS s/p UroLift with Dr. Penne in November 2024 on finasteride , Flomax , and Sanctura   and sees [facility]    BPH with LUTS, s/p urolift with Dr. Penne 06/2023, on Finasteride , Flomax , Sancture, sees    Carotid arterial disease    a.) s/p LEFT CEA on 09/06/2019   Chest pain 09/20/2017   Colon cancer screening 01/30/2024   Coronary artery disease    a.) MI with stents x 2 in 2004; b.) MI with stent x 1 2005; c.) s/p CABG x 2 06/02/2006 (LIMA-LAD, SVG-PDA); d.) LHC/PCI 08/25/2007: 80% oD1 (2.5 x 12 mm Xience V DES)   DDD  (degenerative disc disease), lumbar    Dyspepsia 01/30/2024   Dyspnea    ESRD (end stage renal disease) (HCC)    a.) s/p RIGHT renal 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/2015   GERD (gastroesophageal reflux disease)    History of blood transfusion    Hospitalization within last 30 days 06/03/2020   Hypertension    Long term current use of aspirin     Long term current use of immunosuppressive drug    a.) mycophenolate  + tacrolimus    Myocardial infarction [facility]) 2004   a.) details unclear; stents x 2 (unknown type/location)   Myocardial infarction (HCC) 2005   a.) details unclear; stent x 1 (unknown type/location)   On apixaban  therapy    Orthopnea 12/16/2023   PAF (paroxysmal atrial fibrillation) (HCC)    a.) CHA2DS2-VASc = 5 (age, HTN, vascular disease history/MI, T2DM) as of 06/10/2023; b.) cardiac rate/rhythm maintained intrinsically without pharmacological intervention; chronically anticoagulated using apixaban    Sepsis due to gram-negative UTI (HCC) 04/27/2020   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   From DM and HTN, f/u with [facility] every 6 months   Subconjunctival hemorrhage of right eye 12/15/2023   T2DM (type 2 diabetes mellitus) (HCC)    Unstable angina (HCC)    Vitamin D deficiency    Past Surgical History:  Procedure Laterality Date   CATARACT EXTRACTION Bilateral    CATARACT EXTRACTION W/PHACO Right 02/03/2017   Procedure: CATARACT EXTRACTION PHACO AND INTRAOCULAR  LENS PLACEMENT (IOC);  Surgeon: Mittie Gaskin, MD;  Location: [facility] ORS;  Service: Ophthalmology;  Laterality: Right;  US  00:35.8 AP% 12.8 CDE 4.57 Fluid lot # [id number]   COLONOSCOPY N/A 01/30/2024   Procedure: COLONOSCOPY;  Surgeon: Therisa Bi, MD;  Location: [facility] ENDOSCOPY;  Service: Gastroenterology;  Laterality: N/A;  IDDM   COLONOSCOPY WITH PROPOFOL  N/A 12/30/2021   Procedure: COLONOSCOPY WITH PROPOFOL ;  Surgeon: Therisa Bi, MD;  Location: [facility] ENDOSCOPY;  Service: Gastroenterology;  Laterality: N/A;   CORONARY  ANGIOPLASTY WITH STENT PLACEMENT Left 2004   stents x 2   CORONARY ANGIOPLASTY WITH STENT PLACEMENT Left 2005   stent x 1   CORONARY ANGIOPLASTY WITH STENT PLACEMENT Left 08/25/2007   Procedure: CORONARY ANGIOPLASTY WITH STENT PLACEMENT; Location: [facility]; Surgeon: Valinda Cage, MD   CORONARY ARTERY BYPASS GRAFT N/A 06/08/2006   Procedure: CORONARY ARTERY BYPASS GRAFT; Location: [facility]; Surgeon: Maude Sharps, MD   CYSTOSCOPY WITH INSERTION OF UROLIFT N/A 06/13/2023   Procedure: CYSTOSCOPY WITH INSERTION OF UROLIFT;  Surgeon: Penne Knee, MD;  Location: [facility] ORS;  Service: Urology;  Laterality: N/A;   ENDARTERECTOMY Left 09/06/2019   Procedure: ENDARTERECTOMY CAROTID;  Surgeon: Marea Selinda RAMAN, MD;  Location: [facility] ORS;  Service: Vascular;  Laterality: Left;   ESOPHAGOGASTRODUODENOSCOPY N/A 01/30/2024   Procedure: EGD (ESOPHAGOGASTRODUODENOSCOPY);  Surgeon: Therisa Bi, MD;  Location: [facility] ENDOSCOPY;  Service: Gastroenterology;  Laterality: N/A;   HIP FRACTURE SURGERY     KIDNEY TRANSPLANT Right 05/10/2015   LEFT HEART CATH AND CORONARY ANGIOGRAPHY Left 05/20/2006   Procedure: LEFT HEART CATH AND CORONARY ANGIOGRAPHY; Location: [facility]; Surgeon: Denyse Bathe, MD   LEFT HEART CATH AND CORONARY ANGIOGRAPHY Left 11/08/2023   Procedure: LEFT HEART CATH AND CORONARY ANGIOGRAPHY with possible intervention;  Surgeon: Bathe Denyse LABOR, MD;  Location: [facility] INVASIVE CV LAB;  Service: Cardiovascular;  Laterality: Left;   LEFT HEART CATH AND CORS/GRAFTS ANGIOGRAPHY Left 08/10/2007   Procedure: LEFT HEART CATH AND CORS/GRAFTS ANGIOGRAPHY; Location: [facility]; Surgeon: Denyse Bathe, MD   LEFT HEART CATH AND CORS/GRAFTS ANGIOGRAPHY Left 05/04/2011   Procedure: LEFT HEART CATH AND CORS/GRAFTS ANGIOGRAPHY; Location: [facility]; Surgeon: Denyse Bathe, MD   POLYPECTOMY  01/30/2024   Procedure: POLYPECTOMY, INTESTINE;  Surgeon: Therisa Bi, MD;  Location: [facility] ENDOSCOPY;  Service: Gastroenterology;;     A IV  Location/Drains/Wounds Patient Lines/Drains/Airways Status     Active Line/Drains/Airways     Name Placement date Placement time Site Days   Peripheral IV 06/01/24 20 G Anterior;Distal;Right;Upper Arm 06/01/24  2252  Arm  1   Fistula / Graft Left Upper arm Arteriovenous fistula --  --  Upper arm  --   Urethral Catheter AG, EMTP Straight-tip;Double-lumen 16 Fr. 05/21/24  1231  Straight-tip;Double-lumen  12   Airway 01/30/24  0811  -- 124            Intake/Output Last 24 hours No intake or output data in the 24 hours ending 06/02/24 0107  Labs/Imaging Results for orders placed or performed during the hospital encounter of 06/01/24 (from the past 48 hours)  Protime-INR     Status: Abnormal   Collection Time: 06/01/24  5:34 PM  Result Value Ref Range   Prothrombin Time 18.7 (H) 11.4 - 15.2 seconds   INR 1.5 (H) 0.8 - 1.2    Comment: (NOTE) INR goal varies based on device and disease states. Performed at [facility], [address] [place], [place] [address]   APTT     Status: None   Collection Time:  06/01/24  5:34 PM  Result Value Ref Range   aPTT 30 24 - 36 seconds    Comment: Performed at [facility], [address] [place], [place] [address]  CBC     Status: None   Collection Time: 06/01/24  5:44 PM  Result Value Ref Range   WBC 7.1 4.0 - 10.5 K/uL   RBC 4.58 4.22 - 5.81 MIL/uL   Hemoglobin 14.0 13.0 - 17.0 g/dL   HCT 56.6 60.9 - 47.9 %   MCV 94.5 80.0 - 100.0 fL   MCH 30.6 26.0 - 34.0 pg   MCHC 32.3 30.0 - 36.0 g/dL   RDW 87.0 88.4 - 84.4 %   Platelets 162 150 - 400 K/uL   nRBC 0.0 0.0 - 0.2 %    Comment: Performed at [facility], [address] [place], [place] [address]  Comprehensive metabolic panel with GFR     Status: Abnormal   Collection Time: 06/01/24  5:44 PM  Result Value Ref Range   Sodium 132 (L) 135 - 145 mmol/L   Potassium 4.0 3.5 - 5.1 mmol/L   Chloride 100 98 - 111 mmol/L   CO2 22 22 - 32 mmol/L   Glucose,  Bld 192 (H) 70 - 99 mg/dL    Comment: Glucose reference range applies only to samples taken after fasting for at least 8 hours.   BUN 41 (H) 8 - 23 mg/dL   Creatinine, Ser 8.13 (H) 0.61 - 1.24 mg/dL   Calcium  8.3 (L) 8.9 - 10.3 mg/dL   Total Protein 7.3 6.5 - 8.1 g/dL   Albumin 4.1 3.5 - 5.0 g/dL   AST 30 15 - 41 U/L   ALT 30 0 - 44 U/L   Alkaline Phosphatase 45 38 - 126 U/L   Total Bilirubin 1.1 0.0 - 1.2 mg/dL   GFR, Estimated 39 (L) >60 mL/min    Comment: (NOTE) Calculated using the CKD-EPI Creatinine Equation (2021)    Anion gap 10 5 - 15    Comment: Performed at [facility], [address] [place], [place] [address]  Differential     Status: None   Collection Time: 06/01/24  5:44 PM  Result Value Ref Range   Neutrophils Relative % 74 %   Neutro Abs 5.2 1.7 - 7.7 K/uL   Lymphocytes Relative 14 %   Lymphs Abs 1.0 0.7 - 4.0 K/uL   Monocytes Relative 10 %   Monocytes Absolute 0.7 0.1 - 1.0 K/uL   Eosinophils Relative 1 %   Eosinophils Absolute 0.1 0.0 - 0.5 K/uL   Basophils Relative 0 %   Basophils Absolute 0.0 0.0 - 0.1 K/uL   Immature Granulocytes 1 %   Abs Immature Granulocytes 0.05 0.00 - 0.07 K/uL    Comment: Performed at [facility], [address] [place], [place] [address]  Sedimentation rate     Status: None   Collection Time: 06/01/24  5:44 PM  Result Value Ref Range   Sed Rate 4 0 - 20 mm/hr    Comment: Performed at [facility], [address] [place], [place] [address]  CBG monitoring, ED     Status: Abnormal   Collection Time: 06/02/24 12:26 AM  Result Value Ref Range   Glucose-Capillary 126 (H) 70 - 99 mg/dL    Comment: Glucose reference range applies only to samples taken after fasting for at least 8 hours.   CT ANGIO HEAD NECK W WO CM Result Date: 06/01/2024  CLINICAL DATA:  Initial evaluation for acute visual loss. EXAM: CT ANGIOGRAPHY HEAD AND NECK WITH AND WITHOUT CONTRAST TECHNIQUE: Multidetector CT imaging of the  head and neck was performed using the standard protocol during bolus administration of intravenous contrast. Multiplanar CT image reconstructions and MIPs were obtained to evaluate the vascular anatomy. Carotid stenosis measurements (when applicable) are obtained utilizing NASCET criteria, using the distal internal carotid diameter as the denominator. RADIATION DOSE REDUCTION: This exam was performed according to the departmental dose-optimization program which includes automated exposure control, adjustment of the mA and/or kV according to patient size and/or use of iterative reconstruction technique. CONTRAST:  60mL OMNIPAQUE  IOHEXOL  300 MG/ML  SOLN COMPARISON:  CT from earlier the same day. FINDINGS: CTA NECK FINDINGS Aortic arch: Visualized aortic arch within normal limits for caliber with standard branch pattern. Moderate aortic atherosclerosis. Sequelae of prior CABG noted. No significant stenosis about the origin the great vessels. Right carotid system: Right common and internal carotid arteries are patent without dissection. Atheromatous change about the right carotid bulb with associated stenosis of up to 55% by NASCET criteria. Left carotid system: Left common and internal carotid arteries are patent without dissection. Mild atheromatous change about the left common carotid artery without hemodynamically significant greater than 50% stenosis. Vertebral arteries: Both vertebral arteries arise from subclavian arteries. 55% stenosis noted involving the proximal left subclavian artery prior to the takeoff of the left vertebral artery (series 6, image 299). Left vertebral artery dominant. Atheromatous change at the origin of the right vertebral artery with severe stenosis. Vertebral arteries otherwise patent without stenosis or dissection. Skeleton: No worrisome osseous lesions. Moderate spondylosis at C4-5 through C6-7. Prior sternotomy noted. Other neck: No other acute finding. Upper chest: No other acute  finding. Review of the MIP images confirms the above findings CTA HEAD FINDINGS Anterior circulation: Atheromatous change about the carotid siphons bilaterally, left worse than right. No hemodynamically significant stenosis about the right siphon. Moderate to severe stenosis at the para clinoid left ICA. A1 segments patent bilaterally. Right A1 dominant. Normal anterior communicating artery complex. Anterior cerebral arteries patent without visible stenosis. No M1 stenosis or occlusion. 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 No proximal MCA branch occlusion or high-grade stenosis. Distal MCA branches perfused and symmetric. Posterior circulation: Both V4 segments patent without stenosis. Both PICA patent. Basilar patent without stenosis. Superior cerebral arteries patent bilaterally. Left PCA supplied via the basilar. Predominant fetal type origin of the right PCA. Both PCAs patent to their distal aspects without stenosis. A small patent allowing for timing the contrast bolus. Venous sinuses: Patent allowing for timing the contrast bolus. Anatomic variants: Fetal type right PCA. Review of the MIP images confirms the above findings IMPRESSION: 1. Negative CTA for large vessel occlusion or other emergent finding. 2. 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 Neuroendovascular consultation and referral suggested. 3. Atheromatous change about the right carotid bulb with associated stenosis of up to 55% by NASCET criteria. 4. Atheromatous change about the carotid siphons with associated moderate to severe stenosis at the para clinoid left ICA. 5. 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 6. 55% stenosis involving the proximal left subclavian artery prior to the takeoff of the left vertebral artery. Aortic Atherosclerosis (ICD10-I70.0). Electronically  Signed   By: Morene Hoard M.D.   On: 06/01/2024  23:43   CT HEAD WO CONTRAST Result Date: 06/01/2024 EXAM: CT HEAD WITHOUT CONTRAST 06/01/2024 06:08:19 PM TECHNIQUE: CT of the head was performed without the administration of intravenous contrast. Automated exposure control, iterative reconstruction, and/or weight based adjustment of the mA/kV was utilized to reduce the radiation dose to as low as reasonably achievable. COMPARISON: 10/19/2010 CLINICAL HISTORY: Neuro deficit, acute, stroke suspected. Blurred vision. FINDINGS: BRAIN AND VENTRICLES: No acute hemorrhage. No evidence of acute infarct. No hydrocephalus. No extra-axial collection. No mass effect or midline shift. There is diffuse cerebral atrophy and chronic small vessel disease throughout the deep white matter. ORBITS: No acute abnormality. SINUSES: No acute abnormality. SOFT TISSUES AND SKULL: No acute soft tissue abnormality. No skull fracture. IMPRESSION: 1. No acute intracranial abnormality. 2. Diffuse cerebral atrophy and chronic small vessel disease throughout the deep white matter. Electronically signed by: Franky Crease MD 06/01/2024 06:20 PM EDT RP Workstation: [id number]    Pending Labs Unresulted Labs (From admission, onward)     Start     Ordered   06/01/24 1734  C-reactive protein  Once,   URGENT        06/01/24 1733   06/01/24 1733  Urine Drug Screen, Qualitative  (Not Code Stroke (No thrombolytic / No IR, Stroke suspected.))  Once,   URGENT       Question:  Indication  Answer:  Altered mental status, unspecified R41.82   06/01/24 1733            Vitals/Pain Today's Vitals   06/01/24 1724 06/01/24 2126 06/01/24 2300 06/01/24 2358  BP: (!) 130/52 (!) 155/49    Pulse: 61 (!) 51    Resp: 18 14    Temp: (!) 97.5 F (36.4 C)  97.8 F (36.6 C)   TempSrc: Oral  Oral   SpO2: 96% 98%  100%  PainSc: 2        Isolation Precautions No active isolations  Medications Medications  iohexol  (OMNIPAQUE ) 300 MG/ML  solution 60 mL (60 mLs Intravenous Contrast Given 06/01/24 2305)  aspirin  chewable tablet 324 mg (324 mg Oral Given 06/02/24 0039)    Mobility walks     Focused Assessments Neuro Assessment Handoff:  Swallow screen pass? Yes  Cardiac Rhythm: Sinus bradycardia NIH Stroke Scale  Dizziness Present: No Headache Present: No Interval: Shift assessment Level of Consciousness (1a.)   : Alert, keenly responsive LOC Questions (1b. )   : Answers both questions correctly LOC Commands (1c. )   : Performs both tasks correctly Best Gaze (2. )  : Normal Visual (3. )  : No visual loss Facial Palsy (4. )    : Normal symmetrical movements Motor Arm, Left (5a. )   : No drift Motor Arm, Right (5b. ) : No drift Motor Leg, Left (6a. )  : No drift Motor Leg, Right (6b. ) : No drift Limb Ataxia (7. ): Absent Sensory (8. )  : Normal, no sensory loss Best Language (9. )  : No aphasia Dysarthria (10. ): Normal Extinction/Inattention (11.)   : No Abnormality Complete NIHSS TOTAL: 0     Neuro Assessment:   Neuro Checks:   Initial (06/01/24 2323)  Has TPA been given? No If patient is a Neuro Trauma and patient is going to OR before floor call report to 4N Charge nurse: [phone] or [phone]   R Recommendations: See Admitting Provider Note  Report given to:   Additional Notes:

## 2024-06-02 NOTE — Evaluation (Signed)
 Occupational Therapy Evaluation Patient Details Name: [name] MRN: [id number] DOB: Dec 31, 1955 Today's Date: 06/02/2024   History of Present Illness   Pt is a 68 year old male presented with sudden loss of R eye vision, admitted for stroke work up     PMH significant for PAF on Eliquis , HTN, chronic HFrEF, CKD stage IIIb, kidney transplant, nonobstructive multivessel CAD, IDDM, HLD     Clinical Impressions Chart reviewed to date, pt greeted semi supine in bed, alert and oriented x4, agreeable to OT evaluation. PTA pt reports he amb with a cane, is MOD I-I in ADL/IADL. Pt is performing ADL/functional mobility at a surprising-CGA level. He reports his R eye vision is improving but describes it as blacked out on half and blurry on the other half. He is able to manage ADL/mobility with compensatory techniques provided via education at this time. Pt is left as received, all needs met. OT will follow acutely.  Recommend neurophthalmologist follow up if pt vision/function is affected long term.      If plan is discharge home, recommend the following:   Assistance with cooking/housework;Assist for transportation;A little help with walking and/or transfers     Functional Status Assessment   Patient has had a recent decline in their functional status and demonstrates the ability to make significant improvements in function in a reasonable and predictable amount of time.     Equipment Recommendations   None recommended by OT     Recommendations for Other Services         Precautions/Restrictions   Precautions Precautions: Fall Recall of Precautions/Restrictions: Intact Restrictions Weight Bearing Restrictions Per Provider Order: No     Mobility Bed Mobility Overal bed mobility: Modified Independent                  Transfers Overall transfer level: Needs assistance Equipment used: Straight cane Transfers: Sit to/from Stand Sit to Stand: Supervision                   Balance Overall balance assessment: Needs assistance Sitting-balance support: Feet supported Sitting balance-Leahy Scale: Good     Standing balance support: Single extremity supported, Reliant on assistive device for balance, During functional activity Standing balance-Leahy Scale: Fair                             ADL either performed or assessed with clinical judgement   ADL Overall ADL's : Needs assistance/impaired Eating/Feeding: Supervision/ safety;Sitting   Grooming: Supervision/safety               Lower Body Dressing: Supervision/safety   Toilet Transfer: Supervision/safety;Contact guard Marine Scientist Details (indicate cue type and reason): simulated wtih SPC         Functional mobility during ADLs: Supervision/safety;Contact guard assist (approx 200' with [facility])       Vision Patient Visual Report: Other (comment) (R eye vision changes- reports half is blacked out/half is blurry) Vision Assessment?: Yes Eye Alignment: Within Functional Limits Ocular Range of Motion: Within Functional Limits Alignment/Gaze Preference: Within Defined Limits Tracking/Visual Pursuits: Able to track stimulus in all quads without difficulty Saccades: Within functional limits Convergence: Within functional limits Visual Fields: Impaired-to be further tested in functional context Additional Comments: draws a clock WFL     Perception Perception: Within Functional Limits       Praxis Praxis: WFL       Pertinent Vitals/Pain Pain Assessment Pain Assessment: No/denies pain  Extremity/Trunk Assessment Upper Extremity Assessment Upper Extremity Assessment: Overall WFL for tasks assessed   Lower Extremity Assessment Lower Extremity Assessment: Generalized weakness       Communication Communication Communication: No apparent difficulties   Cognition Arousal: Alert Behavior During Therapy: WFL for tasks  assessed/performed Cognition: No apparent impairments                               Following commands: Intact       Cueing  General Comments   Cueing Techniques: Verbal cues  vss   Exercises Other Exercises Other Exercises: edu pt family re role of OT, role of rehab, compensatory techniques for visual deficits   Shoulder Instructions      Home Living Family/patient expects to be discharged to:: Private residence Living Arrangements: Spouse/significant other Available Help at Discharge: Family Type of Home: House Home Access: Stairs to enter [employer] of Steps: 4 in the front, 3 in the back Entrance Stairs-Rails: Right;Left Home Layout: One level     Bathroom Shower/Tub: Walk-in shower         Home Equipment: [place] - single point;Shower seat;BSC/3in1          Prior Functioning/Environment Prior Level of Function : Independent/Modified Independent;Driving             Mobility Comments: SPC for amb ADLs Comments: MOD I-I in ADL/IADL    OT Problem List: Decreased strength;Decreased activity tolerance;Impaired balance (sitting and/or standing);Decreased knowledge of use of DME or AE;Impaired vision/perception   OT Treatment/Interventions: Self-care/ADL training;Balance training;Therapeutic exercise;Therapeutic activities;Neuromuscular education;Energy conservation;DME and/or AE instruction;Visual/perceptual remediation/compensation;Patient/family education      OT Goals(Current goals can be found in the care plan section)   Acute Rehab OT Goals Patient Stated Goal: go home OT Goal Formulation: With patient/family Time For Goal Achievement: 06/16/24 Potential to Achieve Goals: Good ADL Goals Pt Will Perform Grooming: with modified independence;sitting;standing Pt Will Perform Lower Body Dressing: with modified independence;sitting/lateral leans;sit to/from stand Pt Will Transfer to Toilet: with modified  independence;ambulating Pt Will Perform Toileting - Clothing Manipulation and hygiene: with modified independence;sit to/from stand;sitting/lateral leans   OT Frequency:  Min 2X/week    Co-evaluation              AM-PAC OT 6 Clicks Daily Activity     Outcome Measure Help from another person eating meals?: None Help from another person taking care of personal grooming?: None Help from another person toileting, which includes using toliet, bedpan, or urinal?: None Help from another person bathing (including washing, rinsing, drying)?: A Little Help from another person to put on and taking off regular upper body clothing?: None Help from another person to put on and taking off regular lower body clothing?: A Little 6 Click Score: 22   End of Session Equipment Utilized During Treatment: Other (comment) [facility]) Nurse Communication:  (charge nurse re: pt reports he lost his wallet)  Activity Tolerance: Patient tolerated treatment well Patient left: in bed;with call bell/phone within reach;with bed alarm set  OT Visit Diagnosis: Other abnormalities of gait and mobility (R26.89);Other symptoms and signs involving the nervous system (R29.898)                Time: 8562-8544 OT Time Calculation (min): 18 min Charges:  OT General Charges $OT Visit: 1 Visit OT Evaluation $OT Eval Low Complexity: 1 Low  Therisa Sheffield, OTD OTR/L  06/02/24, 3:55 PM

## 2024-06-02 NOTE — Evaluation (Signed)
 Physical Therapy Evaluation Patient Details Name: [name] MRN: [id number] DOB: October 14, 1955 Today's Date: 06/02/2024  History of Present Illness  Pt is a 68 y/o M presenting to ED from [facility] with c/o sudden loss of R eye vision. Imaging negative for intracranial abnormality. 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 PMH significant for PAF on Eliquis , HTN, chronic HFrEF, CKD stage IIIb, hx of kidney transplant, CAD, IDDM, HLD.   Clinical Impression  Pt A&Ox4, pleasant and agreeable to participate in PT evaluation. At baseline, pt is modI with [facility] for ambulation, states he is very active and goes to the gym regularly, denies hx of falls. Pt was received in bed, modI for bed mobility this date. Pt performed 2 STS from EOB with supervision, able to stand and manage telemetry box with fair standing balance. Pt amb ~147ft with SPC in R hand, no overt LOB but appeared unsteady throughout, noted increased R compensatory trunk lean to advance LLE and bilateral hips held in slight internal rotation throughout. Pt reported gait deficits as chronic. Pt was left in bathroom at end of session- RN called and informed, supportive family in room. Pt would benefit from skilled PT intervention to address listed deficits (see PT Problem List) and allow for safe return to PLOF.         If plan is discharge home, recommend the following: A little help with walking and/or transfers;A little help with bathing/dressing/bathroom;Assist for transportation;Help with stairs or ramp for entrance   Can travel by private vehicle        Equipment Recommendations None recommended by PT  Recommendations for Other Services       Functional Status Assessment Patient has had a recent decline in their functional status and demonstrates the ability to make significant improvements in function in a reasonable and predictable amount of time.      Precautions / Restrictions Precautions Precautions: Fall Recall of Precautions/Restrictions: Intact Restrictions Weight Bearing Restrictions Per Provider Order: No      Mobility  Bed Mobility Overal bed mobility: Modified Independent             General bed mobility comments: no physical assistance required    Transfers Overall transfer level: Needs assistance Equipment used: Straight cane Transfers: Sit to/from Stand Sit to Stand: Supervision           General transfer comment: no physical assistance, good eccentric control to sitting.    Ambulation/Gait Ambulation/Gait assistance: Contact guard assist Gait Distance (Feet): 160 Feet Assistive device: Straight cane Gait Pattern/deviations: Step-through pattern, Decreased stance time - left, Decreased weight shift to left, Narrow base of support       General Gait Details: No LOB throughout but was unsteady with ambulation, amb with SPC in R hand. Noted to have mild compensatory trunk lean to R to advance LLE. Pt reports chronic difficulty with LLE. Steady cadence throughout  Stairs            Wheelchair Mobility     Tilt Bed    Modified Rankin (Stroke Patients Only)       Balance Overall balance assessment: Needs assistance Sitting-balance support: Feet supported Sitting balance-Leahy Scale: Good Sitting balance - Comments: steady static and dynamic sitting   Standing balance support: Single extremity supported, No upper extremity supported, Reliant on assistive device for balance Standing balance-Leahy Scale: Fair Standing balance comment: more steady with SPC use  Pertinent Vitals/Pain Pain Assessment Pain Assessment: No/denies pain    Home Living Family/patient expects to be discharged to:: Private residence Living Arrangements: Spouse/significant other Available Help at Discharge: Family Type of Home: House Home Access: Stairs to  enter Entrance Stairs-Rails: Doctor, General Practice of Steps: 4 in the front, 3 in the back   Home Layout: One level Home Equipment: Cane - single point;Shower seat;BSC/3in1      Prior Function Prior Level of Function : Independent/Modified Independent;Driving             Mobility Comments: Pt reports using SPC for ambulation, denies hx of falls. States that he goes to the gym 5x a week, unable to amb long distances ADLs Comments: IND with ADLs     Extremity/Trunk Assessment   Upper Extremity Assessment Upper Extremity Assessment: Defer to OT evaluation    Lower Extremity Assessment Lower Extremity Assessment: Generalized weakness (sensation and coordination intact bilaterally)       Communication   Communication Communication: No apparent difficulties    Cognition Arousal: Alert Behavior During Therapy: WFL for tasks assessed/performed   PT - Cognitive impairments: No apparent impairments                       PT - Cognition Comments: A&Ox4, pleasant and cooperative throughout Following commands: Intact       Cueing Cueing Techniques: Verbal cues     General Comments General comments (skin integrity, edema, etc.): vss    Exercises     Assessment/Plan    PT Assessment Patient needs continued PT services  PT Problem List Decreased strength;Decreased balance;Decreased mobility;Decreased knowledge of use of DME       PT Treatment Interventions DME instruction;Gait training;Stair training;Functional mobility training;Therapeutic activities;Therapeutic exercise;Balance training;Patient/family education    PT Goals (Current goals can be found in the Care Plan section)  Acute Rehab PT Goals Patient Stated Goal: to go home PT Goal Formulation: With patient Time For Goal Achievement: 06/16/24 Potential to Achieve Goals: Good    Frequency Min 2X/week     Co-evaluation               AM-PAC PT 6 Clicks Mobility  Outcome  Measure Help needed turning from your back to your side while in a flat bed without using bedrails?: None Help needed moving from lying on your back to sitting on the side of a flat bed without using bedrails?: None Help needed moving to and from a bed to a chair (including a wheelchair)?: A Little Help needed standing up from a chair using your arms (e.g., wheelchair or bedside chair)?: A Little Help needed to walk in hospital room?: A Little Help needed climbing 3-5 steps with a railing? : A Little 6 Click Score: 20    End of Session Equipment Utilized During Treatment: Gait belt Activity Tolerance: Patient tolerated treatment well Patient left: Other (comment) (Pt left in bathroom- RN notified and aware, family present in room) Nurse Communication: Mobility status;Other (comment) (pt left in bathroom) PT Visit Diagnosis: Unsteadiness on feet (R26.81);Other abnormalities of gait and mobility (R26.89)    Time: 8477-8464 PT Time Calculation (min) (ACUTE ONLY): 13 min   Charges:   PT Evaluation $PT Eval Low Complexity: 1 Low   PT General Charges $$ ACUTE PT VISIT: 1 Visit         Janell Axe, SPT

## 2024-06-03 ENCOUNTER — Observation Stay: Admit: 2024-06-03

## 2024-06-03 DIAGNOSIS — Z7901 Long term (current) use of anticoagulants: Secondary | ICD-10-CM | POA: Diagnosis not present

## 2024-06-03 DIAGNOSIS — I7090 Unspecified atherosclerosis: Secondary | ICD-10-CM | POA: Diagnosis not present

## 2024-06-03 DIAGNOSIS — H5460 Unqualified visual loss, one eye, unspecified: Secondary | ICD-10-CM | POA: Diagnosis not present

## 2024-06-03 DIAGNOSIS — H3411 Central retinal artery occlusion, right eye: Secondary | ICD-10-CM | POA: Diagnosis not present

## 2024-06-03 DIAGNOSIS — Z7982 Long term (current) use of aspirin: Secondary | ICD-10-CM

## 2024-06-03 LAB — LIPID PANEL
Cholesterol: 108 mg/dL (ref 0–200)
HDL: 35 mg/dL — ABNORMAL LOW (ref >40)
LDL Cholesterol: 60 mg/dL (ref 0–99)
Total CHOL/HDL Ratio: 3.1 ratio
Triglycerides: 63 mg/dL (ref <150)
VLDL: 13 mg/dL (ref 0–40)

## 2024-06-03 LAB — CBC WITH DIFFERENTIAL/PLATELET
Abs Immature Granulocytes: 0.03 K/uL (ref 0.00–0.07)
Basophils Absolute: 0 K/uL (ref 0.0–0.1)
Basophils Relative: 1 %
Eosinophils Absolute: 0.1 K/uL (ref 0.0–0.5)
Eosinophils Relative: 2 %
HCT: 40.5 % (ref 39.0–52.0)
Hemoglobin: 13.9 g/dL (ref 13.0–17.0)
Immature Granulocytes: 0 %
Lymphocytes Relative: 14 %
Lymphs Abs: 1 K/uL (ref 0.7–4.0)
MCH: 30.8 pg (ref 26.0–34.0)
MCHC: 34.3 g/dL (ref 30.0–36.0)
MCV: 89.6 fL (ref 80.0–100.0)
Monocytes Absolute: 0.8 K/uL (ref 0.1–1.0)
Monocytes Relative: 12 %
Neutro Abs: 5 K/uL (ref 1.7–7.7)
Neutrophils Relative %: 71 %
Platelets: 132 K/uL — ABNORMAL LOW (ref 150–400)
RBC: 4.52 MIL/uL (ref 4.22–5.81)
RDW: 12.8 % (ref 11.5–15.5)
WBC: 7.1 K/uL (ref 4.0–10.5)
nRBC: 0 % (ref 0.0–0.2)

## 2024-06-03 LAB — COMPREHENSIVE METABOLIC PANEL WITH GFR
ALT: 29 U/L (ref 0–44)
AST: 29 U/L (ref 15–41)
Albumin: 3.6 g/dL (ref 3.5–5.0)
Alkaline Phosphatase: 43 U/L (ref 38–126)
Anion gap: 15 (ref 5–15)
BUN: 39 mg/dL — ABNORMAL HIGH (ref 8–23)
CO2: 20 mmol/L — ABNORMAL LOW (ref 22–32)
Calcium: 8.3 mg/dL — ABNORMAL LOW (ref 8.9–10.3)
Chloride: 102 mmol/L (ref 98–111)
Creatinine, Ser: 1.79 mg/dL — ABNORMAL HIGH (ref 0.61–1.24)
GFR, Estimated: 41 mL/min — ABNORMAL LOW (ref >60)
Glucose, Bld: 114 mg/dL — ABNORMAL HIGH (ref 70–99)
Potassium: 3.8 mmol/L (ref 3.5–5.1)
Sodium: 137 mmol/L (ref 135–145)
Total Bilirubin: 1 mg/dL (ref 0.0–1.2)
Total Protein: 7.1 g/dL (ref 6.5–8.1)

## 2024-06-03 LAB — GLUCOSE, CAPILLARY
Glucose-Capillary: 225 mg/dL — ABNORMAL HIGH (ref 70–99)
Glucose-Capillary: 192 mg/dL — ABNORMAL HIGH (ref 70–99)
Glucose-Capillary: 98 mg/dL (ref 70–99)
Glucose-Capillary: 146 mg/dL — ABNORMAL HIGH (ref 70–99)
Glucose-Capillary: 85 mg/dL (ref 70–99)

## 2024-06-03 LAB — HEMOGLOBIN A1C
Hgb A1c MFr Bld: 6.7 % — ABNORMAL HIGH (ref 4.8–5.6)
Mean Plasma Glucose: 145.59 mg/dL

## 2024-06-03 NOTE — Plan of Care (Signed)
  Problem: Coping: Goal: Ability to adjust to condition or change in health will improve Outcome: Progressing   Problem: Fluid Volume: Goal: Ability to maintain a balanced intake and output will improve Outcome: Progressing   Problem: Metabolic: Goal: Ability to maintain appropriate glucose levels will improve Outcome: Progressing   Problem: Nutritional: Goal: Maintenance of adequate nutrition will improve Outcome: Progressing   Problem: Skin Integrity: Goal: Risk for impaired skin integrity will decrease Outcome: Progressing   Problem: Education: Goal: Knowledge of disease or condition will improve Outcome: Progressing   Problem: Coping: Goal: Will verbalize positive feelings about self Outcome: Progressing Goal: Will identify appropriate support needs Outcome: Progressing   Problem: Nutrition: Goal: Dietary intake will improve Outcome: Progressing

## 2024-06-03 NOTE — Consult Note (Signed)
 Requesting Physician: Dezii    Chief Complaint: Vision loss in the right eye  HPI: [name] is an 68 y.o. male with medical history significant of PAF on Eliquis , HTN, chronic HFrEF, CKD stage IIIb, kidney transplant, nonobstructive multivessel CAD, IDDM, HLD, presented with sudden loss of right eye vision.   Symptoms started on 05/31/24, after lunch when he suddenly developed  dark spot in the center on the right eye, gradually right-sided vision even got worse and on the day of presentation only able to discern light and darkness on the right side.  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  Patient reported that he has been compliant with all his medication including twice daily Eliquis  and ASA 81mg . Also reports hemorrhage in his right eye that occurred a few months ago.    LSN: 1200 on 05/31/24 TNK Given: No: Outside time window Thrombectomy?: No, outside time window mRS: 0  Past Medical History:  Diagnosis Date   Acute exacerbation of CHF (congestive heart failure) (HCC) 12/16/2023   Acute on chronic congestive heart failure (HCC) 11/03/2023   Adenomatous polyp of colon 01/30/2024   AKI (acute kidney injury) 12/16/2023   Aortic atherosclerosis    Arthritis    BPH with LUTS s/p UroLift with Dr. Penne in November 2024 on finasteride , Flomax , and Sanctura   and sees [facility]    BPH with LUTS, s/p urolift with Dr. Penne 06/2023, on Finasteride , Flomax , Sancture, sees    Carotid arterial disease    a.) s/p LEFT CEA on 09/06/2019   Chest pain 09/20/2017   Colon cancer screening 01/30/2024   Coronary artery disease    a.) MI with stents x 2 in 2004; b.) MI with stent x 1 2005; c.) s/p CABG x 2 06/02/2006 (LIMA-LAD, SVG-PDA); d.) LHC/PCI 08/25/2007: 80% oD1 (2.5 x 12 mm Xience V DES)   DDD (degenerative disc disease), lumbar    Dyspepsia 01/30/2024   Dyspnea    ESRD  (end stage renal disease) (HCC)    a.) s/p RIGHT renal 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/2015   GERD (gastroesophageal reflux disease)    History of blood transfusion    Hospitalization within last 30 days 06/03/2020   Hypertension    Long term current use of aspirin     Long term current use of immunosuppressive drug    a.) mycophenolate  + tacrolimus    Myocardial infarction [facility]) 2004   a.) details unclear; stents x 2 (unknown type/location)   Myocardial infarction (HCC) 2005   a.) details unclear; stent x 1 (unknown type/location)   On apixaban  therapy    Orthopnea 12/16/2023   PAF (paroxysmal atrial fibrillation) (HCC)    a.) CHA2DS2-VASc = 5 (age, HTN, vascular disease history/MI, T2DM) as of 06/10/2023; b.) cardiac rate/rhythm maintained intrinsically without pharmacological intervention; chronically anticoagulated using apixaban    Sepsis due to gram-negative UTI (HCC) 04/27/2020   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   From DM and HTN, f/u with [facility] every 6 months   Subconjunctival hemorrhage of right eye 12/15/2023   T2DM (type 2 diabetes mellitus) (HCC)    Unstable angina (HCC)    Vitamin D deficiency     Past Surgical History:  Procedure Laterality Date   CATARACT EXTRACTION Bilateral    CATARACT EXTRACTION W/PHACO Right 02/03/2017   Procedure: CATARACT EXTRACTION PHACO AND INTRAOCULAR LENS PLACEMENT (IOC);  Surgeon: Mittie Gaskin, MD;  Location: [facility] ORS;  Service:  Ophthalmology;  Laterality: Right;  US  00:35.8 AP% 12.8 CDE 4.57 Fluid lot # [id number]   COLONOSCOPY N/A 01/30/2024   Procedure: COLONOSCOPY;  Surgeon: Therisa Bi, MD;  Location: [facility] ENDOSCOPY;  Service: Gastroenterology;  Laterality: N/A;  IDDM   COLONOSCOPY WITH PROPOFOL  N/A 12/30/2021   Procedure: COLONOSCOPY WITH PROPOFOL ;  Surgeon: Therisa Bi, MD;  Location: [facility] ENDOSCOPY;  Service: Gastroenterology;  Laterality: N/A;   CORONARY ANGIOPLASTY WITH STENT PLACEMENT Left 2004   stents x 2   CORONARY  ANGIOPLASTY WITH STENT PLACEMENT Left 2005   stent x 1   CORONARY ANGIOPLASTY WITH STENT PLACEMENT Left 08/25/2007   Procedure: CORONARY ANGIOPLASTY WITH STENT PLACEMENT; Location: [facility]; Surgeon: Valinda Cage, MD   CORONARY ARTERY BYPASS GRAFT N/A 06/08/2006   Procedure: CORONARY ARTERY BYPASS GRAFT; Location: [facility]; Surgeon: Maude Sharps, MD   CYSTOSCOPY WITH INSERTION OF UROLIFT N/A 06/13/2023   Procedure: CYSTOSCOPY WITH INSERTION OF UROLIFT;  Surgeon: Penne Knee, MD;  Location: [facility] ORS;  Service: Urology;  Laterality: N/A;   ENDARTERECTOMY Left 09/06/2019   Procedure: ENDARTERECTOMY CAROTID;  Surgeon: Marea Selinda RAMAN, MD;  Location: [facility] ORS;  Service: Vascular;  Laterality: Left;   ESOPHAGOGASTRODUODENOSCOPY N/A 01/30/2024   Procedure: EGD (ESOPHAGOGASTRODUODENOSCOPY);  Surgeon: Therisa Bi, MD;  Location: [facility] ENDOSCOPY;  Service: Gastroenterology;  Laterality: N/A;   HIP FRACTURE SURGERY     KIDNEY TRANSPLANT Right 05/10/2015   LEFT HEART CATH AND CORONARY ANGIOGRAPHY Left 05/20/2006   Procedure: LEFT HEART CATH AND CORONARY ANGIOGRAPHY; Location: [facility]; Surgeon: Denyse Bathe, MD   LEFT HEART CATH AND CORONARY ANGIOGRAPHY Left 11/08/2023   Procedure: LEFT HEART CATH AND CORONARY ANGIOGRAPHY with possible intervention;  Surgeon: Bathe Denyse LABOR, MD;  Location: [facility] INVASIVE CV LAB;  Service: Cardiovascular;  Laterality: Left;   LEFT HEART CATH AND CORS/GRAFTS ANGIOGRAPHY Left 08/10/2007   Procedure: LEFT HEART CATH AND CORS/GRAFTS ANGIOGRAPHY; Location: [facility]; Surgeon: Denyse Bathe, MD   LEFT HEART CATH AND CORS/GRAFTS ANGIOGRAPHY Left 05/04/2011   Procedure: LEFT HEART CATH AND CORS/GRAFTS ANGIOGRAPHY; Location: [facility]; Surgeon: Denyse Bathe, MD   POLYPECTOMY  01/30/2024   Procedure: POLYPECTOMY, INTESTINE;  Surgeon: Therisa Bi, MD;  Location: [facility] ENDOSCOPY;  Service: Gastroenterology;;    Family History  Problem Relation Age of Onset   Osteoporosis Mother    Drug abuse Sister     Drug abuse Brother    Social History:  reports that he has quit smoking. He has never used smokeless tobacco. He reports that he does not drink alcohol and does not use drugs.  Allergies:  Allergies  Allergen Reactions   Oxybutynin  Other (See Comments)    Incomplete bladder emptying   Trospium  Chloride Other (See Comments)    Urinary Retention    Medications: Prior to Admission:  Medications Prior to Admission  Medication Sig Dispense Refill Last Dose/Taking   amiodarone  (PACERONE ) 200 MG tablet Take 1 tablet (200 mg total) by mouth daily. 90 tablet 1 06/01/2024   ascorbic acid (VITAMIN C) 500 MG tablet Take 500 mg by mouth daily.   06/01/2024   aspirin  EC 81 MG EC tablet Take 1 tablet (81 mg total) by mouth daily at 6 (six) AM.   06/01/2024   atorvastatin  (LIPITOR) 40 MG tablet Take 40 mg by mouth at bedtime.   06/01/2024 Bedtime   CELLCEPT  250 MG capsule Take 500 mg by mouth 2 (two) times daily.   06/01/2024   Cholecalciferol  (VITAMIN D) 50 MCG (2000 UT) CAPS Take 2,000 Units by mouth daily.  06/01/2024   ELIQUIS  5 MG TABS tablet Take 5 mg by mouth 2 (two) times daily.    06/01/2024   finasteride  (PROSCAR ) 5 MG tablet Take 1 tablet (5 mg total) by mouth daily. 90 tablet 3 06/01/2024   guaiFENesin -codeine 100-10 MG/5ML syrup Take 5 mLs by mouth every 6 (six) hours as needed. 120 mL 0 Taking As Needed   HUMALOG KWIKPEN 100 UNIT/ML KiwkPen Inject 5-10 Units into the skin 3 (three) times daily. Inject 10 units daily at breakfast, 10 units at lunch and up to 10 units or less at supper per SSI   06/01/2024   hydrALAZINE  (APRESOLINE ) 25 MG tablet Take 1 tablet (25 mg total) by mouth 2 (two) times daily. 120 tablet 11 06/01/2024   insulin  glargine (LANTUS  SOLOSTAR) 100 UNIT/ML Solostar Pen Inject 30 Units into the skin at bedtime.   06/01/2024   linagliptin  (TRADJENTA ) 5 MG TABS tablet Take 1 tablet (5 mg total) by mouth daily. 90 tablet 1 06/01/2024   losartan  (COZAAR ) 100 MG tablet Take 1  tablet (100 mg total) by mouth daily. 30 tablet 11 06/01/2024   nitroGLYCERIN  (NITROSTAT ) 0.4 MG SL tablet Place 1 tablet (0.4 mg total) under the tongue every 5 (five) minutes as needed for chest pain. 100 tablet 3 Taking As Needed   omeprazole (PRILOSEC) 20 MG capsule Take 20 mg by mouth daily.   06/01/2024   PROGRAF  1 MG capsule Take 1 mg by mouth every morning.   06/01/2024   tacrolimus  (PROGRAF ) 0.5 MG capsule Take 0.5 mg by mouth at bedtime.   06/01/2024   tamsulosin  (FLOMAX ) 0.4 MG CAPS capsule Take 2 capsules (0.8 mg total) by mouth daily. 180 capsule 3 06/01/2024   torsemide (DEMADEX) 10 MG tablet Take 1 tablet (10 mg total) by mouth 2 (two) times daily. 60 tablet 11 06/01/2024   albuterol (VENTOLIN HFA) 108 (90 Base) MCG/ACT inhaler Inhale 2 puffs into the lungs every 6 (six) hours as needed for wheezing or shortness of breath. (Patient not taking: Reported on 06/02/2024) 8 g 0 Not Taking   amoxicillin-clavulanate (AUGMENTIN) 875-125 MG tablet Take 1 tablet by mouth 2 (two) times daily. (Patient not taking: Reported on 06/02/2024) 20 tablet 0 Not Taking   Continuous Blood Gluc Sensor (FREESTYLE LIBRE 2 SENSOR) MISC by Does not apply route.      Continuous Glucose Sensor (FREESTYLE LIBRE 2 PLUS SENSOR) MISC 1 each by Other route every 14 (fourteen) days.      Insulin  Pen Needle 33G X 4 MM MISC To use with insulin  pen 4 times per day. E 13.9       ROS: History obtained from the patient  General ROS: negative for - chills, fatigue, fever, night sweats, weight gain or weight loss Psychological ROS: negative for - behavioral disorder, hallucinations, memory difficulties, mood swings or suicidal ideation Ophthalmic ROS: as noted in HPI ENT ROS: negative for - epistaxis, nasal discharge, oral lesions, sore throat, tinnitus or vertigo Allergy and Immunology ROS: negative for - hives or itchy/watery eyes Hematological and Lymphatic ROS: negative for - bleeding problems, bruising or swollen lymph  nodes Endocrine ROS: negative for - galactorrhea, hair pattern changes, polydipsia/polyuria or temperature intolerance Respiratory ROS: negative for - cough, hemoptysis, shortness of breath or wheezing Cardiovascular ROS: negative for - chest pain, dyspnea on exertion, edema or irregular heartbeat Gastrointestinal ROS: negative for - abdominal pain, diarrhea, hematemesis, nausea/vomiting or stool incontinence Genito-Urinary ROS: negative for - dysuria, hematuria, incontinence or urinary frequency/urgency Musculoskeletal ROS:  negative for - joint swelling or muscular weakness Neurological ROS: as noted in HPI Dermatological ROS: negative for rash and skin lesion changes   Physical Examination: Blood pressure (!) 161/58, pulse (!) 55, temperature 98.1 F (36.7 C), resp. rate 18, height 5' 8 (1.727 m), SpO2 95%.  Neurologic Examination: HEENT-  Normocephalic, no lesions, without obvious abnormality.  Normal external eye and conjunctiva.  Normal TM's bilaterally.  Normal auditory canals and external ears. Normal external nose, mucus membranes and septum.  Normal pharynx. Cardiovascular- Single S1, S2, pulses palpable throughout   Lungs- CTA Abdomen- soft, non-tender; bowel sounds normal; no masses,  no organomegaly Extremities- no edema Musculoskeletal-muscle atrophy in the hands bilaterally   Neurological Examination   Mental Status: Alert, oriented, thought content appropriate.  Speech fluent without evidence of aphasia.  Able to follow 3 step commands without difficulty. Cranial Nerves: II: Blurred vision of the right visual field in the right eye III,IV, VI: mild right eye ptosis, extra-ocular motions intact bilaterally V,VII: smile symmetric, facial light touch sensation normal bilaterally VIII: hearing normal bilaterally XI: bilateral shoulder shrug XII: midline tongue extension Motor: Right : Upper extremity   5/5    Left:     Upper extremity   5/5  Lower extremity    5/5     Lower extremity   5/5 Tone and bulk:normal tone throughout; no atrophy noted Sensory: Pinprick and light touch intact throughout, bilaterally Deep Tendon Reflexes: 1+ in the upper extremities and absent in the lower extremities Plantars: Right: mute   Left: mute Cerebellar: normal finger-to-nose and normal heel-to-shin testing bilaterally Gait: not tested due to safety concerns      Results for orders placed or performed during the hospital encounter of 06/01/24 (from the past 48 hours)  Protime-INR     Status: Abnormal   Collection Time: 06/01/24  5:34 PM  Result Value Ref Range   Prothrombin Time 18.7 (H) 11.4 - 15.2 seconds   INR 1.5 (H) 0.8 - 1.2    Comment: (NOTE) INR goal varies based on device and disease states. Performed at [facility], [address] [place], [place] [address]   APTT     Status: None   Collection Time: 06/01/24  5:34 PM  Result Value Ref Range   aPTT 30 24 - 36 seconds    Comment: Performed at [facility], [address] [place], [place] [address]  CBC     Status: None   Collection Time: 06/01/24  5:44 PM  Result Value Ref Range   WBC 7.1 4.0 - 10.5 K/uL   RBC 4.58 4.22 - 5.81 MIL/uL   Hemoglobin 14.0 13.0 - 17.0 g/dL   HCT 56.6 60.9 - 47.9 %   MCV 94.5 80.0 - 100.0 fL   MCH 30.6 26.0 - 34.0 pg   MCHC 32.3 30.0 - 36.0 g/dL   RDW 87.0 88.4 - 84.4 %   Platelets 162 150 - 400 K/uL   nRBC 0.0 0.0 - 0.2 %    Comment: Performed at [facility], [address] [place], [place] [address]  Comprehensive metabolic panel with GFR     Status: Abnormal   Collection Time: 06/01/24  5:44 PM  Result Value Ref Range   Sodium 132 (L) 135 - 145 mmol/L   Potassium 4.0 3.5 - 5.1 mmol/L   Chloride 100 98 - 111 mmol/L   CO2 22 22 - 32 mmol/L   Glucose, Bld 192 (H) 70 - 99 mg/dL  Comment: Glucose reference range applies only to samples taken after fasting for at least 8 hours.   BUN 41 (H) 8 - 23 mg/dL   Creatinine,  Ser 8.13 (H) 0.61 - 1.24 mg/dL   Calcium  8.3 (L) 8.9 - 10.3 mg/dL   Total Protein 7.3 6.5 - 8.1 g/dL   Albumin 4.1 3.5 - 5.0 g/dL   AST 30 15 - 41 U/L   ALT 30 0 - 44 U/L   Alkaline Phosphatase 45 38 - 126 U/L   Total Bilirubin 1.1 0.0 - 1.2 mg/dL   GFR, Estimated 39 (L) >60 mL/min    Comment: (NOTE) Calculated using the CKD-EPI Creatinine Equation (2021)    Anion gap 10 5 - 15    Comment: Performed at [facility], [address] [place], [place] [address]  Differential     Status: None   Collection Time: 06/01/24  5:44 PM  Result Value Ref Range   Neutrophils Relative % 74 %   Neutro Abs 5.2 1.7 - 7.7 K/uL   Lymphocytes Relative 14 %   Lymphs Abs 1.0 0.7 - 4.0 K/uL   Monocytes Relative 10 %   Monocytes Absolute 0.7 0.1 - 1.0 K/uL   Eosinophils Relative 1 %   Eosinophils Absolute 0.1 0.0 - 0.5 K/uL   Basophils Relative 0 %   Basophils Absolute 0.0 0.0 - 0.1 K/uL   Immature Granulocytes 1 %   Abs Immature Granulocytes 0.05 0.00 - 0.07 K/uL    Comment: Performed at [facility], [address] [place], [place] [address]  Sedimentation rate     Status: None   Collection Time: 06/01/24  5:44 PM  Result Value Ref Range   Sed Rate 4 0 - 20 mm/hr    Comment: Performed at [facility], [address] [place], [place] [address]  CBG monitoring, ED     Status: Abnormal   Collection Time: 06/02/24 12:26 AM  Result Value Ref Range   Glucose-Capillary 126 (H) 70 - 99 mg/dL    Comment: Glucose reference range applies only to samples taken after fasting for at least 8 hours.  Urine Drug Screen, Qualitative     Status: None   Collection Time: 06/02/24 12:43 AM  Result Value Ref Range   Tricyclic, Ur Screen NONE DETECTED NONE DETECTED   Amphetamines, Ur Screen NONE DETECTED NONE DETECTED   MDMA (Ecstasy)Ur Screen NONE DETECTED NONE DETECTED   Cocaine Metabolite,Ur [place] NONE DETECTED NONE DETECTED   Opiate, Ur Screen NONE DETECTED NONE DETECTED    Phencyclidine (PCP) Ur S NONE DETECTED NONE DETECTED   Cannabinoid 50 Ng, Ur  NONE DETECTED NONE DETECTED   Barbiturates, Ur Screen NONE DETECTED NONE DETECTED   Benzodiazepine, Ur Scrn NONE DETECTED NONE DETECTED   Methadone Scn, Ur NONE DETECTED NONE DETECTED    Comment: (NOTE) Tricyclics + metabolites, urine    Cutoff 1000 ng/mL Amphetamines + metabolites, urine  Cutoff 1000 ng/mL MDMA (Ecstasy), urine              Cutoff 500 ng/mL Cocaine Metabolite, urine          Cutoff 300 ng/mL Opiate + metabolites, urine        Cutoff 300 ng/mL Phencyclidine (PCP), urine         Cutoff 25 ng/mL Cannabinoid, urine                 Cutoff 50 ng/mL Barbiturates + metabolites, urine  Cutoff 200 ng/mL Benzodiazepine, urine  Cutoff 200 ng/mL Methadone, urine                   Cutoff 300 ng/mL  The urine drug screen provides only a preliminary, unconfirmed analytical test result and should not be used for non-medical purposes. Clinical consideration and professional judgment should be applied to any positive drug screen result due to possible interfering substances. A more specific alternate chemical method must be used in order to obtain a confirmed analytical result. Gas chromatography / mass spectrometry (GC/MS) is the preferred confirm atory method. Performed at [facility], [address] [place], [place] [address]   C-reactive protein     Status: None   Collection Time: 06/02/24 12:43 AM  Result Value Ref Range   CRP 0.6 <1.0 mg/dL    Comment: Performed at [facility], [address], [place], [place] [address]  Urinalysis, Complete w Microscopic -Urine, Clean Catch     Status: Abnormal   Collection Time: 06/02/24 12:43 AM  Result Value Ref Range   Color, Urine YELLOW (A) YELLOW   APPearance CLEAR (A) CLEAR   Specific Gravity, Urine 1.025 1.005 - 1.030   pH 6.0 5.0 - 8.0   Glucose, UA NEGATIVE NEGATIVE mg/dL   Hgb urine dipstick NEGATIVE NEGATIVE    Bilirubin Urine NEGATIVE NEGATIVE   Ketones, ur NEGATIVE NEGATIVE mg/dL   Protein, ur NEGATIVE NEGATIVE mg/dL   Nitrite NEGATIVE NEGATIVE   Leukocytes,Ua NEGATIVE NEGATIVE   RBC / HPF 0-5 0 - 5 RBC/hpf   WBC, UA 0-5 0 - 5 WBC/hpf   Bacteria, UA NONE SEEN NONE SEEN   Squamous Epithelial / HPF 0 0 - 5 /HPF   Hyaline Casts, UA PRESENT     Comment: Performed at [facility], [address] [place], [place] [address]  Glucose, capillary     Status: None   Collection Time: 06/02/24  4:48 PM  Result Value Ref Range   Glucose-Capillary 72 70 - 99 mg/dL    Comment: Glucose reference range applies only to samples taken after fasting for at least 8 hours.  Lipid panel     Status: Abnormal   Collection Time: 06/03/24  5:42 AM  Result Value Ref Range   Cholesterol 108 0 - 200 mg/dL   Triglycerides 63 <849 mg/dL   HDL 35 (L) >59 mg/dL   Total CHOL/HDL Ratio 3.1 RATIO   VLDL 13 0 - 40 mg/dL   LDL Cholesterol 60 0 - 99 mg/dL    Comment:        Total Cholesterol/HDL:CHD Risk Coronary Heart Disease Risk Table                     Men   Women  1/2 Average Risk   3.4   3.3  Average Risk       5.0   4.4  2 X Average Risk   9.6   7.1  3 X Average Risk  23.4   11.0        Use the calculated Patient Ratio above and the CHD Risk Table to determine the patient's CHD Risk.        ATP III CLASSIFICATION (LDL):  <100     mg/dL   Optimal  899-870  mg/dL   Near or Above                    Optimal  130-159  mg/dL   Borderline  839-810  mg/dL   High  >809     mg/dL   Very High Performed at [facility], [address] [place] [place], [place] [address]    MR BRAIN WO CONTRAST Result Date: 06/02/2024 EXAM: MRI BRAIN WITHOUT CONTRAST 06/02/2024 01:35:00 PM TECHNIQUE: Multiplanar multisequence MRI of the head/brain was performed without the administration of intravenous contrast. COMPARISON: None available. CLINICAL HISTORY: Stroke, follow up. FINDINGS: BRAIN AND VENTRICLES: No acute  infarct. No intracranial hemorrhage. No mass. No midline shift. No hydrocephalus. Minimal nonspecific white matter T2-weighted signal hyperintensities, which may be associated with early chronic small vessel disease or migraine headaches. The sella is unremarkable. Normal flow voids. ORBITS: No acute abnormality. SINUSES AND MASTOIDS: No acute abnormality. BONES AND SOFT TISSUES: Normal marrow signal. No acute soft tissue abnormality. IMPRESSION: 1. No acute intracranial abnormality. 2. Minimal nonspecific white matter T2-weighted signal hyperintensities, possibly related to early chronic small vessel disease or migraine headaches. Electronically signed by: Franky Stanford MD 06/02/2024 01:44 PM EDT RP Workstation: [id number]   CT ANGIO HEAD NECK W WO CM Result Date: 06/01/2024 CLINICAL DATA:  Initial evaluation for acute visual loss. EXAM: CT ANGIOGRAPHY HEAD AND NECK WITH AND WITHOUT CONTRAST TECHNIQUE: Multidetector CT imaging of the head and neck was performed using the standard protocol during bolus administration of intravenous contrast. Multiplanar CT image reconstructions and MIPs were obtained to evaluate the vascular anatomy. Carotid stenosis measurements (when applicable) are obtained utilizing NASCET criteria, using the distal internal carotid diameter as the denominator. RADIATION DOSE REDUCTION: This exam was performed according to the departmental dose-optimization program which includes automated exposure control, adjustment of the mA and/or kV according to patient size and/or use of iterative reconstruction technique. CONTRAST:  60mL OMNIPAQUE  IOHEXOL  300 MG/ML  SOLN COMPARISON:  CT from earlier the same day. FINDINGS: CTA NECK FINDINGS Aortic arch: Visualized aortic arch within normal limits for caliber with standard branch pattern. Moderate aortic atherosclerosis. Sequelae of prior CABG noted. No significant stenosis about the origin the great vessels. Right carotid system: Right common and  internal carotid arteries are patent without dissection. Atheromatous change about the right carotid bulb with associated stenosis of up to 55% by NASCET criteria. Left carotid system: Left common and internal carotid arteries are patent without dissection. Mild atheromatous change about the left common carotid artery without hemodynamically significant greater than 50% stenosis. Vertebral arteries: Both vertebral arteries arise from subclavian arteries. 55% stenosis noted involving the proximal left subclavian artery prior to the takeoff of the left vertebral artery (series 6, image 299). Left vertebral artery dominant. Atheromatous change at the origin of the right vertebral artery with severe stenosis. Vertebral arteries otherwise patent without stenosis or dissection. Skeleton: No worrisome osseous lesions. Moderate spondylosis at C4-5 through C6-7. Prior sternotomy noted. Other neck: No other acute finding. Upper chest: No other acute finding. Review of the MIP images confirms the above findings CTA HEAD FINDINGS Anterior circulation: Atheromatous change about the carotid siphons bilaterally, left worse than right. No hemodynamically significant stenosis about the right siphon. Moderate to severe stenosis at the para clinoid left ICA. A1 segments patent bilaterally. Right A1 dominant. Normal anterior communicating artery complex. Anterior cerebral arteries patent without visible stenosis. No M1 stenosis or occlusion. 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 No proximal MCA branch occlusion or high-grade stenosis. Distal MCA branches perfused and symmetric. Posterior circulation: Both V4 segments patent without stenosis.  Both PICA patent. Basilar patent without stenosis. Superior cerebral arteries patent bilaterally. Left PCA supplied via the basilar. Predominant fetal type origin of the right PCA.  Both PCAs patent to their distal aspects without stenosis. A small patent allowing for timing the contrast bolus. Venous sinuses: Patent allowing for timing the contrast bolus. Anatomic variants: Fetal type right PCA. Review of the MIP images confirms the above findings IMPRESSION: 1. Negative CTA for large vessel occlusion or other emergent finding. 2. 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 Neuroendovascular consultation and referral suggested. 3. Atheromatous change about the right carotid bulb with associated stenosis of up to 55% by NASCET criteria. 4. Atheromatous change about the carotid siphons with associated moderate to severe stenosis at the para clinoid left ICA. 5. 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 6. 55% stenosis involving the proximal left subclavian artery prior to the takeoff of the left vertebral artery. Aortic Atherosclerosis (ICD10-I70.0). Electronically Signed   By: Morene Hoard M.D.   On: 06/01/2024 23:43   CT HEAD WO CONTRAST Result Date: 06/01/2024 EXAM: CT HEAD WITHOUT CONTRAST 06/01/2024 06:08:19 PM TECHNIQUE: CT of the head was performed without the administration of intravenous contrast. Automated exposure control, iterative reconstruction, and/or weight based adjustment of the mA/kV was utilized to reduce the radiation dose to as low as reasonably achievable. COMPARISON: 10/19/2010 CLINICAL HISTORY: Neuro deficit, acute, stroke suspected. Blurred vision. FINDINGS: BRAIN AND VENTRICLES: No acute hemorrhage. No evidence of acute infarct. No hydrocephalus. No extra-axial collection. No mass effect or midline shift. There is diffuse cerebral atrophy and chronic small vessel disease throughout the deep white matter. ORBITS: No acute abnormality. SINUSES: No acute abnormality. SOFT TISSUES AND SKULL: No acute soft tissue abnormality. No skull fracture. IMPRESSION: 1. No acute intracranial abnormality. 2. Diffuse  cerebral atrophy and chronic small vessel disease throughout the deep white matter. Electronically signed by: Franky Crease MD 06/01/2024 06:20 PM EDT RP Workstation: [id number]    Assessment: 68 y.o. male with medical history significant of PAF on Eliquis , HTN, chronic HFrEF, CKD stage IIIb, kidney transplant, nonobstructive multivessel CAD, IDDM, HLD, presented with sudden loss of right eye vision.  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  Etiology usually embolic but CTA of the head and neck personally reviewed reveals bilateral atheromatous disease.  Therefore atherosclerotic disease is a possible etiology as well.  MRI of the brain personally reviewed and shows no acute changes.  Patient on ASA and Eliquis  at home, along with a high intensity statin. LDL 60  Stroke Risk Factors - atrial fibrillation, diabetes mellitus, hyperlipidemia, and hypertension  Plan: 1. HgbA1c.  Goal <7.0 2. Continue Eliquis , ASA and Lipitor at current dose 3. Echocardiogram pending.  May be performed on an outpatient basis 4. Telemetry monitoring 5. Frequent neuro checks 6. Patient to follow up with ophthalmology on an outpatient basis 7. Aneurysm noted on CTA as well.  Neurosurgery made aware and will follow up on an outpatient basis with follow up imaging in 6-12 months.  Sonny Hock, MD Neurology  06/03/2024, 11:00 AM

## 2024-06-03 NOTE — Discharge Summary (Addendum)
 DISCHARGE SUMMARY    [name] FMW:[id number] DOB: May 14, 1956 DOA: 06/01/2024  PCP: Abbey Bruckner, MD  Admit date: 06/01/2024 Discharge date: 06/03/2024   Recommendations for Outpatient Follow-up:  Follow up with PCP in 1-2 weeks for chronic condition management See ophthalmology as scheduled to continue vision monitoring Follow-up with cardiology for repeat echocardiogram.    Hospital Course: [name] is a 68 year old male with paroxysmal A-fib on Eliquis , hypertension, chronic heart failure with reduced EF, CKD stage IIIb status post kidney transplant on immunosuppressive therapy, nonobstructive multivessel CAD, insulin -dependent diabetes, hyperlipidemia, who presented to ophthalmology with sudden loss of right eye vision.  Ophthalmology recommended patient report to the ED to rule out acute CVA.  Patient endorses adherence to all medications as prescribed. On arrival to the ED labs and vitals were within normal limits.  Head CT without acute findings.  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 Neurology and neurosurgery were consulted. Ultimately this aneurysm is an incidental finding unrelated to his admission.  He was referred to the appropriate neurosurgeon outpatient to consider treatment. Brain MRI without acute changes.  Given this is likely central retinal artery occlusion, origin could be embolic however he does have bilateral atheromatous disease.  Stroke workup revealed LDL at goal.  Echocardiogram is still pending at time of discharge but patient assures he has close outpatient follow-up with his cardiologist within 2 weeks and will have repeat echo at that time.  Patient will need to continue on Eliquis  and aspirin , as well as statin at DC. On day of discharge 11/2 the patient, his wife, and his daughter were all at bedside.  We extensively discussed care plan  and they are in agreement with close outpatient follow-up and requesting for discharge home today.  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 - MRI without acute CVA - Most recent hemoglobin A1c 6.6%, patient reports excellent medication adherence.  Repeat A1c still pending.  He will need to follow closely with PCP to ensure diabetes remains well-controlled - LDL at goal. - Continue aspirin , statin, Lipitor - Echocardiogram unlikely to change management, as patient is already on DOAC and aspirin .  Follow-up with cardiology for repeat echo.  Patient has upcoming appointment with cardiology already scheduled. Regularly follows with Dr. Deretha. - Do not drive until cleared by ophthalmology.  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  Neurosurgery consulted and has arranged outpatient follow-up  Chronic conditions: Atrial fibrillation Hypertension Chronic heart failure with reduced EF CKD stage IIIb status post kidney transplant on immunosuppressive therapy, nonobstructive multivessel CAD Insulin -dependent diabetes Hyperlipidemia - Continue all home medications without changes.   Discharge Instructions  Discharge Instructions     Call MD for:  difficulty breathing, headache or visual disturbances   Complete by: As directed    Call MD for:  persistant dizziness or light-headedness   Complete by: As directed    Call MD for:  persistant nausea and vomiting   Complete by: As directed    Call MD for:  severe uncontrolled pain   Complete by: As directed    Call MD for:  temperature >100.4   Complete by: As directed    Diet general   Complete by: As directed    Discharge instructions   Complete by: As directed  Keep all follow-up appointments as discussed.  Follow-up with ophthalmology in 1 to 2 weeks for continued vision monitoring.   Do not drive until you have been cleared by ophthalmology  Follow-up with cardiology to discuss repeat echocardiogram and evaluate for any changes  Continue taking all medications as prescribed.  Keep a blood pressure log and follow-up with your primary care doctor to review and discuss antihypertensive therapy.   Increase activity slowly   Complete by: As directed       Allergies as of 06/03/2024       Reactions   Oxybutynin  Other (See Comments)   Incomplete bladder emptying   Trospium  Chloride Other (See Comments)   Urinary Retention        Medication List     STOP taking these medications    amoxicillin-clavulanate 875-125 MG tablet Commonly known as: AUGMENTIN       TAKE these medications    albuterol 108 (90 Base) MCG/ACT inhaler Commonly known as: VENTOLIN HFA Inhale 2 puffs into the lungs every 6 (six) hours as needed for wheezing or shortness of breath.   amiodarone  200 MG tablet Commonly known as: PACERONE  Take 1 tablet (200 mg total) by mouth daily.   ascorbic acid 500 MG tablet Commonly known as: VITAMIN C Take 500 mg by mouth daily.   aspirin  EC 81 MG tablet Take 1 tablet (81 mg total) by mouth daily at 6 (six) AM.   atorvastatin  40 MG tablet Commonly known as: LIPITOR Take 40 mg by mouth at bedtime.   CellCept  250 MG capsule Generic drug: mycophenolate  Take 500 mg by mouth 2 (two) times daily.   Eliquis  5 MG Tabs tablet Generic drug: apixaban  Take 5 mg by mouth 2 (two) times daily.   finasteride  5 MG tablet Commonly known as: PROSCAR  Take 1 tablet (5 mg total) by mouth daily.   FreeStyle Libre 2 Sensor Misc by Does not apply route.   FreeStyle Libre 2 Plus Sensor Misc 1 each by Other route every 14 (fourteen) days.   guaiFENesin -codeine 100-10 MG/5ML syrup Take 5 mLs by mouth every 6 (six) hours as needed.   HumaLOG KwikPen 100 UNIT/ML KwikPen Generic drug: insulin  lispro Inject 5-10 Units into the skin 3 (three) times daily.  Inject 10 units daily at breakfast, 10 units at lunch and up to 10 units or less at supper per SSI   hydrALAZINE  25 MG tablet Commonly known as: APRESOLINE  Take 1 tablet (25 mg total) by mouth 2 (two) times daily.   Insulin  Pen Needle 33G X 4 MM Misc To use with insulin  pen 4 times per day. E 13.9   Lantus  SoloStar 100 UNIT/ML Solostar Pen Generic drug: insulin  glargine Inject 30 Units into the skin at bedtime.   linagliptin  5 MG Tabs tablet Commonly known as: TRADJENTA  Take 1 tablet (5 mg total) by mouth daily.   losartan  100 MG tablet Commonly known as: Cozaar  Take 1 tablet (100 mg total) by mouth daily.   nitroGLYCERIN  0.4 MG SL tablet Commonly known as: Nitrostat  Place 1 tablet (0.4 mg total) under the tongue every 5 (five) minutes as needed for chest pain.   omeprazole 20 MG capsule Commonly known as: PRILOSEC Take 20 mg by mouth daily.   Prograf  1 MG capsule Generic drug: tacrolimus  Take 1 mg by mouth every morning.   tacrolimus  0.5 MG capsule Commonly known as: PROGRAF  Take 0.5 mg by mouth at bedtime.   tamsulosin  0.4 MG Caps capsule Commonly known as: FLOMAX  Take  2 capsules (0.8 mg total) by mouth daily.   torsemide 10 MG tablet Commonly known as: DEMADEX Take 1 tablet (10 mg total) by mouth 2 (two) times daily.   Vitamin D 50 MCG (2000 UT) Caps Take 2,000 Units by mouth daily.        Allergies  Allergen Reactions   Oxybutynin  Other (See Comments)    Incomplete bladder emptying   Trospium  Chloride Other (See Comments)    Urinary Retention    Consultations: Treatment Team:  Bobbette Frank, MD   Procedures/Studies: MR BRAIN WO CONTRAST Result Date: 06/02/2024 EXAM: MRI BRAIN WITHOUT CONTRAST 06/02/2024 01:35:00 PM TECHNIQUE: Multiplanar multisequence MRI of the head/brain was performed without the administration of intravenous contrast. COMPARISON: None available. CLINICAL HISTORY: Stroke, follow up. FINDINGS: BRAIN AND VENTRICLES: No acute  infarct. No intracranial hemorrhage. No mass. No midline shift. No hydrocephalus. Minimal nonspecific white matter T2-weighted signal hyperintensities, which may be associated with early chronic small vessel disease or migraine headaches. The sella is unremarkable. Normal flow voids. ORBITS: No acute abnormality. SINUSES AND MASTOIDS: No acute abnormality. BONES AND SOFT TISSUES: Normal marrow signal. No acute soft tissue abnormality. IMPRESSION: 1. No acute intracranial abnormality. 2. Minimal nonspecific white matter T2-weighted signal hyperintensities, possibly related to early chronic small vessel disease or migraine headaches. Electronically signed by: Franky Stanford MD 06/02/2024 01:44 PM EDT RP Workstation: [id number]   CT ANGIO HEAD NECK W WO CM Result Date: 06/01/2024 CLINICAL DATA:  Initial evaluation for acute visual loss. EXAM: CT ANGIOGRAPHY HEAD AND NECK WITH AND WITHOUT CONTRAST TECHNIQUE: Multidetector CT imaging of the head and neck was performed using the standard protocol during bolus administration of intravenous contrast. Multiplanar CT image reconstructions and MIPs were obtained to evaluate the vascular anatomy. Carotid stenosis measurements (when applicable) are obtained utilizing NASCET criteria, using the distal internal carotid diameter as the denominator. RADIATION DOSE REDUCTION: This exam was performed according to the departmental dose-optimization program which includes automated exposure control, adjustment of the mA and/or kV according to patient size and/or use of iterative reconstruction technique. CONTRAST:  60mL OMNIPAQUE  IOHEXOL  300 MG/ML  SOLN COMPARISON:  CT from earlier the same day. FINDINGS: CTA NECK FINDINGS Aortic arch: Visualized aortic arch within normal limits for caliber with standard branch pattern. Moderate aortic atherosclerosis. Sequelae of prior CABG noted. No significant stenosis about the origin the great vessels. Right carotid system: Right common and  internal carotid arteries are patent without dissection. Atheromatous change about the right carotid bulb with associated stenosis of up to 55% by NASCET criteria. Left carotid system: Left common and internal carotid arteries are patent without dissection. Mild atheromatous change about the left common carotid artery without hemodynamically significant greater than 50% stenosis. Vertebral arteries: Both vertebral arteries arise from subclavian arteries. 55% stenosis noted involving the proximal left subclavian artery prior to the takeoff of the left vertebral artery (series 6, image 299). Left vertebral artery dominant. Atheromatous change at the origin of the right vertebral artery with severe stenosis. Vertebral arteries otherwise patent without stenosis or dissection. Skeleton: No worrisome osseous lesions. Moderate spondylosis at C4-5 through C6-7. Prior sternotomy noted. Other neck: No other acute finding. Upper chest: No other acute finding. Review of the MIP images confirms the above findings CTA HEAD FINDINGS Anterior circulation: Atheromatous change about the carotid siphons bilaterally, left worse than right. No hemodynamically significant stenosis about the right siphon. Moderate to severe stenosis at the para clinoid left ICA. A1 segments patent bilaterally. Right A1 dominant. Normal anterior  communicating artery complex. Anterior cerebral arteries patent without visible stenosis. No M1 stenosis or occlusion. 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 No proximal MCA branch occlusion or high-grade stenosis. Distal MCA branches perfused and symmetric. Posterior circulation: Both V4 segments patent without stenosis. Both PICA patent. Basilar patent without stenosis. Superior cerebral arteries patent bilaterally. Left PCA supplied via the basilar. Predominant fetal type origin of the right PCA.  Both PCAs patent to their distal aspects without stenosis. A small patent allowing for timing the contrast bolus. Venous sinuses: Patent allowing for timing the contrast bolus. Anatomic variants: Fetal type right PCA. Review of the MIP images confirms the above findings IMPRESSION: 1. Negative CTA for large vessel occlusion or other emergent finding. 2. 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 Neuroendovascular consultation and referral suggested. 3. Atheromatous change about the right carotid bulb with associated stenosis of up to 55% by NASCET criteria. 4. Atheromatous change about the carotid siphons with associated moderate to severe stenosis at the para clinoid left ICA. 5. 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 6. 55% stenosis involving the proximal left subclavian artery prior to the takeoff of the left vertebral artery. Aortic Atherosclerosis (ICD10-I70.0). Electronically Signed   By: Morene Hoard M.D.   On: 06/01/2024 23:43   CT HEAD WO CONTRAST Result Date: 06/01/2024 EXAM: CT HEAD WITHOUT CONTRAST 06/01/2024 06:08:19 PM TECHNIQUE: CT of the head was performed without the administration of intravenous contrast. Automated exposure control, iterative reconstruction, and/or weight based adjustment of the mA/kV was utilized to reduce the radiation dose to as low as reasonably achievable. COMPARISON: 10/19/2010 CLINICAL HISTORY: Neuro deficit, acute, stroke suspected. Blurred vision. FINDINGS: BRAIN AND VENTRICLES: No acute hemorrhage. No evidence of acute infarct. No hydrocephalus. No extra-axial collection. No mass effect or midline shift. There is diffuse cerebral atrophy and chronic small vessel disease throughout the deep white matter. ORBITS: No acute abnormality. SINUSES: No acute abnormality. SOFT TISSUES AND SKULL: No acute soft tissue abnormality. No skull fracture. IMPRESSION: 1. No acute intracranial abnormality. 2. Diffuse  cerebral atrophy and chronic small vessel disease throughout the deep white matter. Electronically signed by: Franky Crease MD 06/01/2024 06:20 PM EDT RP Workstation: [id number]      Discharge Exam: Vitals:   06/03/24 0414 06/03/24 0744  BP: 128/61 (!) 161/58  Pulse: (!) 59 (!) 55  Resp: 18 18  Temp: 98 F (36.7 C) 98.1 F (36.7 C)  SpO2: 98% 95%   Vitals:   06/03/24 0022 06/03/24 0414 06/03/24 0744 06/03/24 0950  BP: (!) 162/70 128/61 (!) 161/58   Pulse: 61 (!) 59 (!) 55   Resp: 16 18 18    Temp: 98.9 F (37.2 C) 98 F (36.7 C) 98.1 F (36.7 C)   TempSrc: Oral     SpO2: 98% 98% 95%   Height:    5' 8 (1.727 m)    Constitutional:  Normal appearance. Non toxic-appearing.  HENT: Head Normocephalic and atraumatic.  Mucous membranes are moist.  Eyes:  Extraocular intact. Conjunctivae normal.  Cardiovascular: Rate and Rhythm: Normal rate and regular rhythm.  Pulmonary: Non labored, symmetric rise of chest wall.  Skin: warm and dry. not jaundiced.  Neurological: Alert and oriented Psychiatric: Mood and Affect congruent.    The results of significant diagnostics from this hospitalization (including imaging, microbiology, ancillary and laboratory)  are listed below for reference.     Microbiology: No results found for this or any previous visit (from the past 240 hours).   Labs: BNP (last 3 results) Recent Labs    12/16/23 1010  BNP 684.4*   Basic Metabolic Panel: Recent Labs  Lab 06/01/24 1744 06/03/24 1129  NA 132* 137  K 4.0 3.8  CL 100 102  CO2 22 20*  GLUCOSE 192* 114*  BUN 41* 39*  CREATININE 1.86* 1.79*  CALCIUM  8.3* 8.3*   Liver Function Tests: Recent Labs  Lab 06/01/24 1744 06/03/24 1129  AST 30 29  ALT 30 29  ALKPHOS 45 43  BILITOT 1.1 1.0  PROT 7.3 7.1  ALBUMIN 4.1 3.6   No results for input(s): LIPASE, AMYLASE in the last 168 hours. No results for input(s): AMMONIA in the last 168 hours. CBC: Recent Labs  Lab 06/01/24 1744  06/03/24 1129  WBC 7.1 7.1  NEUTROABS 5.2 5.0  HGB 14.0 13.9  HCT 43.3 40.5  MCV 94.5 89.6  PLT 162 132*   Cardiac Enzymes: No results for input(s): CKTOTAL, CKMB, CKMBINDEX, TROPONINI in the last 168 hours. BNP: Invalid input(s): POCBNP CBG: Recent Labs  Lab 06/02/24 0026 06/02/24 1648 06/03/24 1210  GLUCAP 126* 72 85   D-Dimer No results for input(s): DDIMER in the last 72 hours. Hgb A1c No results for input(s): HGBA1C in the last 72 hours. Lipid Profile Recent Labs    06/03/24 0542  CHOL 108  HDL 35*  LDLCALC 60  TRIG 63  CHOLHDL 3.1   Thyroid function studies No results for input(s): TSH, T4TOTAL, T3FREE, THYROIDAB in the last 72 hours.  Invalid input(s): FREET3 Anemia work up No results for input(s): VITAMINB12, FOLATE, FERRITIN, TIBC, IRON, RETICCTPCT in the last 72 hours. Urinalysis    Component Value Date/Time   COLORURINE YELLOW (A) 06/02/2024 0043   APPEARANCEUR CLEAR (A) 06/02/2024 0043   APPEARANCEUR Clear 12/19/2023 0939   LABSPEC 1.025 06/02/2024 0043   PHURINE 6.0 06/02/2024 0043   GLUCOSEU NEGATIVE 06/02/2024 0043   HGBUR NEGATIVE 06/02/2024 0043   BILIRUBINUR NEGATIVE 06/02/2024 0043   BILIRUBINUR Negative 12/19/2023 0939   KETONESUR NEGATIVE 06/02/2024 0043   PROTEINUR NEGATIVE 06/02/2024 0043   UROBILINOGEN 1.0 12/16/2010 1357   NITRITE NEGATIVE 06/02/2024 0043   LEUKOCYTESUR NEGATIVE 06/02/2024 0043   Sepsis Labs Recent Labs  Lab 06/01/24 1744 06/03/24 1129  WBC 7.1 7.1   Microbiology No results found for this or any previous visit (from the past 240 hours).   Time coordinating discharge: 32 min   SIGNED: Maximillion Gill, DO [facility] 06/03/2024, 12:31 PM Pager   If 7PM-7AM, please contact night-coverage

## 2024-06-03 NOTE — TOC Initial Note (Addendum)
 Transition of Care [facility]) - Initial/Assessment Note    Patient Details  Name: [name] MRN: [id number] Date of Birth: June 08, 1956  Transition of Care [facility] CM/SW Contact:    Victory Jackquline RAMAN, RN Phone Number: 06/03/2024, 11:00 AM  Clinical Narrative:    Chart reviewed. RNCM, spoke with the patient via his daughter Bartholomew) as the interpreter. I introduced myself, my role, and explained that discharge planning recommendations would be discussed. PT recommended Home with Home Health/PT. Pt is in agreement with PT's recommendations. States he's not familiar with the [facility] agencies in the area and would like for me to send out referrals to the different agencies and then he and his wife will choose one. Patient has a Medical Laboratory Scientific Officer, [facility] and [employer] at home. Referrals sent. States his wife or his daughter  will pick him up at the time of discharge. RNCM will continue to follow for discharge planning/care coordination and update as applicable.               Expected Discharge Plan: Home w Home Health Services Barriers to Discharge: Continued Medical Work up   Patient Goals and CMS Choice            Expected Discharge Plan and Services       Living arrangements for the past 2 months: Single Family Home                                      Prior Living Arrangements/Services Living arrangements for the past 2 months: Single Family Home Lives with:: Self, Adult Children, Spouse Patient language and need for interpreter reviewed:: Yes Do you feel safe going back to the place where you live?: Yes      Need for Family Participation in Patient Care: Yes (Comment) Care giver support system in place?: Yes (comment) Current home services: DME Criminal Activity/Legal Involvement Pertinent to Current Situation/Hospitalization: No - Comment as needed  Activities of Daily Living   ADL Screening (condition at time of admission) Independently performs ADLs?: Yes (appropriate for developmental  age) Is the patient deaf or have difficulty hearing?: No Does the patient have difficulty seeing, even when wearing glasses/contacts?: No Does the patient have difficulty concentrating, remembering, or making decisions?: No  Permission Sought/Granted                  Emotional Assessment Appearance:: Appears stated age, Well-Groomed Attitude/Demeanor/Rapport: Self-Confident, Engaged, Gracious Affect (typically observed): Calm, Quiet, Pleasant Orientation: : Oriented to Self, Oriented to Place, Oriented to  Time, Oriented to Situation Alcohol / Substance Use: Not Applicable Psych Involvement: No (comment)  Admission diagnosis:  Stroke (HCC) [I63.9] Stroke-like symptoms [R29.90] Chronic kidney disease, unspecified CKD stage [N18.9] Monocular vision loss [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] Patient Active Problem List   Diagnosis Date Noted   Monocular vision loss 06/02/2024   Amaurosis fugax of right eye 06/02/2024   Stroke (HCC) 06/02/2024   Bilateral wheezing 05/09/2024   Pulmonary vascular congestion 05/01/2024   Lower respiratory infection 05/01/2024   Hyperkalemia 04/24/2024   Stage 3a chronic kidney disease (HCC) 04/24/2024   Aortic atherosclerosis 02/21/2024   Osteoarthritis 02/21/2024   Diabetic polyneuropathy associated with type 2 diabetes mellitus (HCC) 02/21/2024   GERD (gastroesophageal reflux disease) 12/21/2023   Heart failure with mildly reduced ejection fraction (HFmrEF) (HCC) 12/21/2023   BPH with LUTS s/p UroLift with Dr. 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 [place] [address] Phone: [phone] Fax: [phone]     Social Drivers of Health (SDOH) Social History: SDOH Screenings   Food Insecurity: No Food Insecurity (06/02/2024)  Housing: Unknown (06/02/2024)  Transportation Needs: No Transportation Needs (06/02/2024)  Utilities: Not At Risk (06/02/2024)  Alcohol Screen: Low Risk  (02/20/2024)  Depression (PHQ2-9): Low Risk  (02/21/2024)  Financial Resource Strain: Low Risk  (02/20/2024)  Physical Activity: Sufficiently Active (02/20/2024)  Social Connections: Moderately Integrated (06/02/2024)  Stress: No Stress Concern Present (02/20/2024)  Tobacco Use: Medium Risk (06/01/2024)  Health Literacy: Adequate Health Literacy (02/21/2024)   SDOH Interventions:     Readmission Risk Interventions     No data to display

## 2024-06-03 NOTE — Consult Note (Signed)
 Patient here for monocular vision loss of OD.  Incidental R MCA aneurysm identified.    - Will arrange outpatient follow up.

## 2024-06-03 NOTE — Plan of Care (Signed)
  Problem: Coping: Goal: Ability to adjust to condition or change in health will improve Outcome: Progressing   Problem: Fluid Volume: Goal: Ability to maintain a balanced intake and output will improve Outcome: Progressing   Problem: Metabolic: Goal: Ability to maintain appropriate glucose levels will improve Outcome: Progressing   Problem: Nutritional: Goal: Maintenance of adequate nutrition will improve Outcome: Progressing   Problem: Skin Integrity: Goal: Risk for impaired skin integrity will decrease Outcome: Progressing   Problem: Education: Goal: Ability to describe self-care measures that may prevent or decrease complications (Diabetes Survival Skills Education) will improve Outcome: Not Applicable Goal: Individualized Educational Video(s) Outcome: Not Applicable   Problem: Nutritional: Goal: Progress toward achieving an optimal weight will improve Outcome: Not Applicable

## 2024-06-05 ENCOUNTER — Telehealth: Payer: Self-pay

## 2024-06-05 ENCOUNTER — Ambulatory Visit (INDEPENDENT_AMBULATORY_CARE_PROVIDER_SITE_OTHER): Payer: Medicare Other

## 2024-06-05 ENCOUNTER — Ambulatory Visit (INDEPENDENT_AMBULATORY_CARE_PROVIDER_SITE_OTHER): Payer: Medicare Other | Admitting: Vascular Surgery

## 2024-06-05 ENCOUNTER — Encounter (INDEPENDENT_AMBULATORY_CARE_PROVIDER_SITE_OTHER): Payer: Self-pay | Admitting: Vascular Surgery

## 2024-06-05 VITALS — BP 144/56 | HR 50 | Resp 16 | Ht 68.0 in | Wt 162.0 lb

## 2024-06-05 DIAGNOSIS — N1831 Chronic kidney disease, stage 3a: Secondary | ICD-10-CM

## 2024-06-05 DIAGNOSIS — Z794 Long term (current) use of insulin: Secondary | ICD-10-CM

## 2024-06-05 DIAGNOSIS — E1122 Type 2 diabetes mellitus with diabetic chronic kidney disease: Secondary | ICD-10-CM | POA: Diagnosis not present

## 2024-06-05 DIAGNOSIS — I6523 Occlusion and stenosis of bilateral carotid arteries: Secondary | ICD-10-CM

## 2024-06-05 DIAGNOSIS — E7800 Pure hypercholesterolemia, unspecified: Secondary | ICD-10-CM | POA: Diagnosis not present

## 2024-06-05 DIAGNOSIS — T829XXD Unspecified complication of cardiac and vascular prosthetic device, implant and graft, subsequent encounter: Secondary | ICD-10-CM

## 2024-06-05 NOTE — Telephone Encounter (Signed)
 Copied from CRM [id number]. Topic: Clinical - Home Health Verbal Orders >> Jun 05, 2024 11:38 AM Laymon HERO wrote: [facility] [phone]  Wanting to get an approval for an evaluation for Physical therapy. Patient was in hospital on 10/31

## 2024-06-05 NOTE — Progress Notes (Signed)
 MRN : [id number]  [name] is a 68 y.o. (09-29-1955) male who presents with chief complaint of  Chief Complaint  Patient presents with   Follow-up    1 year Carotid  .  History of Present Illness: Patient returns in follow-up of his carotid disease and dialysis access.  He has not used his dialysis access in many years after getting a kidney transplant.  His left brachiocephalic AV fistula remains mildly aneurysmal but has not changed dramatically in the last year. Recently, he went to the hospital with visual symptoms and was found to have what sounds like a retinal stroke.  He had a CT angiogram of the head and neck which I have independently reviewed.  He was found to have an intracranial aneurysm and has been referred to neurosurgery in [place].  He also had an approximately 55% stenosis of his cervical right carotid bifurcation.  This has progressed from her previous studies where he had been in the more mild range.  He is status post left carotid endarterectomy several years ago and this is widely patent.  We performed a carotid duplex that was actually scheduled for his annual follow-up.  His carotid velocities have increased and fall in the 40 to 59% range in the internal carotid artery but the greater than 50% range in the distal common carotid artery on the right side.  This would correlate with the CT angiogram.  His left carotid velocities remain in the 1 to 39% range status post endarterectomy in the past.  Current Outpatient Medications  Medication Sig Dispense Refill   amiodarone  (PACERONE ) 200 MG tablet Take 1 tablet (200 mg total) by mouth daily. 90 tablet 1   ascorbic acid (VITAMIN C) 500 MG tablet Take 500 mg by mouth daily.     aspirin  EC 81 MG EC tablet Take 1 tablet (81 mg total) by mouth daily at 6 (six) AM.     atorvastatin  (LIPITOR) 40 MG tablet Take 40 mg by mouth at bedtime.     CELLCEPT  250 MG capsule Take 500 mg by mouth 2 (two) times daily.      Cholecalciferol  (VITAMIN D) 50 MCG (2000 UT) CAPS Take 2,000 Units by mouth daily.     Continuous Blood Gluc Sensor (FREESTYLE LIBRE 2 SENSOR) MISC by Does not apply route.     Continuous Glucose Sensor (FREESTYLE LIBRE 2 PLUS SENSOR) MISC 1 each by Other route every 14 (fourteen) days.     ELIQUIS  5 MG TABS tablet Take 5 mg by mouth 2 (two) times daily.      finasteride  (PROSCAR ) 5 MG tablet Take 1 tablet (5 mg total) by mouth daily. 90 tablet 3   guaiFENesin -codeine 100-10 MG/5ML syrup Take 5 mLs by mouth every 6 (six) hours as needed. 120 mL 0   HUMALOG KWIKPEN 100 UNIT/ML KiwkPen Inject 5-10 Units into the skin 3 (three) times daily. Inject 10 units daily at breakfast, 10 units at lunch and up to 10 units or less at supper per SSI     hydrALAZINE  (APRESOLINE ) 25 MG tablet Take 1 tablet (25 mg total) by mouth 2 (two) times daily. 120 tablet 11   insulin  glargine (LANTUS  SOLOSTAR) 100 UNIT/ML Solostar Pen Inject 30 Units into the skin at bedtime.     Insulin  Pen Needle 33G X 4 MM MISC To use with insulin  pen 4 times per day. E 13.9     linagliptin  (TRADJENTA ) 5 MG TABS tablet Take 1 tablet (5 mg  total) by mouth daily. 90 tablet 1   losartan  (COZAAR ) 100 MG tablet Take 1 tablet (100 mg total) by mouth daily. 30 tablet 11   nitroGLYCERIN  (NITROSTAT ) 0.4 MG SL tablet Place 1 tablet (0.4 mg total) under the tongue every 5 (five) minutes as needed for chest pain. 100 tablet 3   omeprazole (PRILOSEC) 20 MG capsule Take 20 mg by mouth daily.     PROGRAF  1 MG capsule Take 1 mg by mouth every morning.     tacrolimus  (PROGRAF ) 0.5 MG capsule Take 0.5 mg by mouth at bedtime.     tamsulosin  (FLOMAX ) 0.4 MG CAPS capsule Take 2 capsules (0.8 mg total) by mouth daily. 180 capsule 3   torsemide (DEMADEX) 10 MG tablet Take 1 tablet (10 mg total) by mouth 2 (two) times daily. 60 tablet 11   albuterol (VENTOLIN HFA) 108 (90 Base) MCG/ACT inhaler Inhale 2 puffs into the lungs every 6 (six) hours as needed for  wheezing or shortness of breath. (Patient not taking: Reported on 06/02/2024) 8 g 0   No current facility-administered medications for this visit.   Facility-Administered Medications Ordered in Other Visits  Medication Dose Route Frequency Provider Last Rate Last Admin   sodium chloride  flush (NS) 0.9 % injection 3 mL  3 mL Intravenous Q12H Fernand Denyse LABOR, MD        Past Medical History:  Diagnosis Date   Acute exacerbation of CHF (congestive heart failure) (HCC) 12/16/2023   Acute on chronic congestive heart failure (HCC) 11/03/2023   Adenomatous polyp of colon 01/30/2024   AKI (acute kidney injury) 12/16/2023   Aortic atherosclerosis    Arthritis    BPH with LUTS s/p UroLift with Dr. Penne in November 2024 on finasteride , Flomax , and Sanctura   and sees [facility]    BPH with LUTS, s/p urolift with Dr. Penne 06/2023, on Finasteride , Flomax , Sancture, sees    Carotid arterial disease    a.) s/p LEFT CEA on 09/06/2019   Chest pain 09/20/2017   Colon cancer screening 01/30/2024   Coronary artery disease    a.) MI with stents x 2 in 2004; b.) MI with stent x 1 2005; c.) s/p CABG x 2 06/02/2006 (LIMA-LAD, SVG-PDA); d.) LHC/PCI 08/25/2007: 80% oD1 (2.5 x 12 mm Xience V DES)   DDD (degenerative disc disease), lumbar    Dyspepsia 01/30/2024   Dyspnea    ESRD (end stage renal disease) (HCC)    a.) s/p RIGHT renal 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/2015   GERD (gastroesophageal reflux disease)    History of blood transfusion    Hospitalization within last 30 days 06/03/2020   Hypertension    Long term current use of aspirin     Long term current use of immunosuppressive drug    a.) mycophenolate  + tacrolimus    Myocardial infarction [facility]) 2004   a.) details unclear; stents x 2 (unknown type/location)   Myocardial infarction (HCC) 2005   a.) details unclear; stent x 1 (unknown type/location)   On apixaban  therapy    Orthopnea 12/16/2023   PAF (paroxysmal atrial fibrillation) (HCC)    a.)  CHA2DS2-VASc = 5 (age, HTN, vascular disease history/MI, T2DM) as of 06/10/2023; b.) cardiac rate/rhythm maintained intrinsically without pharmacological intervention; chronically anticoagulated using apixaban    Sepsis due to gram-negative UTI (HCC) 04/27/2020   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   From DM and HTN, f/u with [facility] every 6 months   Subconjunctival hemorrhage of right eye 12/15/2023   T2DM (type 2 diabetes mellitus) (  HCC)    Unstable angina (HCC)    Vitamin D deficiency     Past Surgical History:  Procedure Laterality Date   CATARACT EXTRACTION Bilateral    CATARACT EXTRACTION W/PHACO Right 02/03/2017   Procedure: CATARACT EXTRACTION PHACO AND INTRAOCULAR LENS PLACEMENT (IOC);  Surgeon: Mittie Gaskin, MD;  Location: [facility] ORS;  Service: Ophthalmology;  Laterality: Right;  US  00:35.8 AP% 12.8 CDE 4.57 Fluid lot # [id number]   COLONOSCOPY N/A 01/30/2024   Procedure: COLONOSCOPY;  Surgeon: Therisa Bi, MD;  Location: [facility] ENDOSCOPY;  Service: Gastroenterology;  Laterality: N/A;  IDDM   COLONOSCOPY WITH PROPOFOL  N/A 12/30/2021   Procedure: COLONOSCOPY WITH PROPOFOL ;  Surgeon: Therisa Bi, MD;  Location: [facility] ENDOSCOPY;  Service: Gastroenterology;  Laterality: N/A;   CORONARY ANGIOPLASTY WITH STENT PLACEMENT Left 2004   stents x 2   CORONARY ANGIOPLASTY WITH STENT PLACEMENT Left 2005   stent x 1   CORONARY ANGIOPLASTY WITH STENT PLACEMENT Left 08/25/2007   Procedure: CORONARY ANGIOPLASTY WITH STENT PLACEMENT; Location: [facility]; Surgeon: Valinda Cage, MD   CORONARY ARTERY BYPASS GRAFT N/A 06/08/2006   Procedure: CORONARY ARTERY BYPASS GRAFT; Location: [facility]; Surgeon: Maude Sharps, MD   CYSTOSCOPY WITH INSERTION OF UROLIFT N/A 06/13/2023   Procedure: CYSTOSCOPY WITH INSERTION OF UROLIFT;  Surgeon: Penne Knee, MD;  Location: [facility] ORS;  Service: Urology;  Laterality: N/A;   ENDARTERECTOMY Left 09/06/2019   Procedure: ENDARTERECTOMY CAROTID;  Surgeon: Marea Selinda RAMAN, MD;  Location: [facility] ORS;  Service: Vascular;  Laterality: Left;   ESOPHAGOGASTRODUODENOSCOPY N/A 01/30/2024   Procedure: EGD (ESOPHAGOGASTRODUODENOSCOPY);  Surgeon: Therisa Bi, MD;  Location: [facility] ENDOSCOPY;  Service: Gastroenterology;  Laterality: N/A;   HIP FRACTURE SURGERY     KIDNEY TRANSPLANT Right 05/10/2015   LEFT HEART CATH AND CORONARY ANGIOGRAPHY Left 05/20/2006   Procedure: LEFT HEART CATH AND CORONARY ANGIOGRAPHY; Location: [facility]; Surgeon: Denyse Bathe, MD   LEFT HEART CATH AND CORONARY ANGIOGRAPHY Left 11/08/2023   Procedure: LEFT HEART CATH AND CORONARY ANGIOGRAPHY with possible intervention;  Surgeon: Bathe Denyse LABOR, MD;  Location: [facility] INVASIVE CV LAB;  Service: Cardiovascular;  Laterality: Left;   LEFT HEART CATH AND CORS/GRAFTS ANGIOGRAPHY Left 08/10/2007   Procedure: LEFT HEART CATH AND CORS/GRAFTS ANGIOGRAPHY; Location: [facility]; Surgeon: Denyse Bathe, MD   LEFT HEART CATH AND CORS/GRAFTS ANGIOGRAPHY Left 05/04/2011   Procedure: LEFT HEART CATH AND CORS/GRAFTS ANGIOGRAPHY; Location: [facility]; Surgeon: Denyse Bathe, MD   POLYPECTOMY  01/30/2024   Procedure: POLYPECTOMY, INTESTINE;  Surgeon: Therisa Bi, MD;  Location: [facility] ENDOSCOPY;  Service: Gastroenterology;;     Social History   Tobacco Use   Smoking status: Former   Smokeless tobacco: Never   Tobacco comments:    30 years ago  Vaping Use   Vaping status: Never Used  Substance Use Topics   Alcohol use: No    Alcohol/week: 0.0 standard drinks of alcohol   Drug use: Never      Family History  Problem Relation Age of Onset   Osteoporosis Mother    Drug abuse Sister    Drug abuse Brother      Allergies  Allergen Reactions   Oxybutynin  Other (See Comments)    Incomplete bladder emptying   Trospium  Chloride Other (See Comments)    Urinary Retention     REVIEW OF SYSTEMS (Negative unless checked)   Constitutional: [] Weight loss  [] Fever  [] Chills Cardiac: [] Chest pain   [] Chest pressure   [] Palpitations    [] Shortness of breath when laying flat   [] Shortness of  breath at rest   [x] Shortness of breath with exertion. Vascular:  [] Pain in legs with walking   [] Pain in legs at rest   [] Pain in legs when laying flat   [] Claudication   [] Pain in feet when walking  [] Pain in feet at rest  [] Pain in feet when laying flat   [] History of DVT   [] Phlebitis   [] Swelling in legs   [] Varicose veins   [] Non-healing ulcers Pulmonary:   [] Uses home oxygen   [] Productive cough   [] Hemoptysis   [] Wheeze  [] COPD   [] Asthma Neurologic:  [] Dizziness  [] Blackouts   [] Seizures   [] History of stroke   [x] History of TIA  [] Aphasia   [x] Temporary blindness   [] Dysphagia   [] Weakness or numbness in arms   [] Weakness or numbness in legs Musculoskeletal:  [x] Arthritis   [] Joint swelling   [] Joint pain   [] Low back pain Hematologic:  [] Easy bruising  [] Easy bleeding   [] Hypercoagulable state   [] Anemic   Gastrointestinal:  [] Blood in stool   [] Vomiting blood  [x] Gastroesophageal reflux/heartburn   [] Abdominal pain Genitourinary:  [x] Chronic kidney disease   [] Difficult urination  [] Frequent urination  [] Burning with urination   [] Hematuria Skin:  [] Rashes   [] Ulcers   [] Wounds Psychological:  [] History of anxiety   []  History of major depression.  Physical Examination  Vitals:   06/05/24 0845  BP: (!) 144/56  Pulse: (!) 50  Resp: 16  Weight: 162 lb (73.5 kg)  Height: 5' 8 (1.727 m)   Body mass index is 24.63 kg/m. Gen:  WD/WN, NAD Head: [place]/AT, No temporalis wasting. Ear/Nose/Throat: Hearing grossly intact, nares w/o erythema or drainage, trachea midline Eyes: Conjunctiva clear. Sclera non-icteric Neck: Supple.  Soft right carotid bruit  Pulmonary:  Good air movement, equal and clear to auscultation bilaterally.  Cardiac: RRR, No JVD Vascular: good thrill in left brachiocephalic AVF Vessel Right Left  Radial Palpable Palpable       Musculoskeletal: M/S 5/5 throughout.  No deformity or atrophy.  Walks with a cane.   No significant lower extremity edema. Neurologic: CN 2-12 intact. Sensation grossly intact in extremities.  Symmetrical.  Speech is fluent. Motor exam as listed above. Psychiatric: Judgment intact, Mood & affect appropriate for pt's clinical situation. Dermatologic: No rashes or ulcers noted.  No cellulitis or open wounds.     CBC Lab Results  Component Value Date   WBC 7.1 06/03/2024   HGB 13.9 06/03/2024   HCT 40.5 06/03/2024   MCV 89.6 06/03/2024   PLT 132 (L) 06/03/2024    BMET    Component Value Date/Time   NA 137 06/03/2024 1129   NA 139 05/11/2024 1053   K 3.8 06/03/2024 1129   K 4.8 08/29/2012 0954   CL 102 06/03/2024 1129   CO2 20 (L) 06/03/2024 1129   GLUCOSE 114 (H) 06/03/2024 1129   BUN 39 (H) 06/03/2024 1129   BUN 32 (H) 05/11/2024 1053   CREATININE 1.79 (H) 06/03/2024 1129   CALCIUM  8.3 (L) 06/03/2024 1129   CALCIUM  8.0 (L) 11/11/2010 2120   GFRNONAA 41 (L) 06/03/2024 1129   GFRAA >60 04/29/2020 0337   Estimated Creatinine Clearance: 37.7 mL/min (A) (by C-G formula based on SCr of 1.79 mg/dL (H)).  COAG Lab Results  Component Value Date   INR 1.5 (H) 06/01/2024   INR 1.7 (H) 05/21/2020   INR 1.4 (H) 05/19/2020    Radiology MR BRAIN WO CONTRAST Result Date: 06/02/2024 EXAM: MRI BRAIN WITHOUT CONTRAST 06/02/2024 01:35:00  PM TECHNIQUE: Multiplanar multisequence MRI of the head/brain was performed without the administration of intravenous contrast. COMPARISON: None available. CLINICAL HISTORY: Stroke, follow up. FINDINGS: BRAIN AND VENTRICLES: No acute infarct. No intracranial hemorrhage. No mass. No midline shift. No hydrocephalus. Minimal nonspecific white matter T2-weighted signal hyperintensities, which may be associated with early chronic small vessel disease or migraine headaches. The sella is unremarkable. Normal flow voids. ORBITS: No acute abnormality. SINUSES AND MASTOIDS: No acute abnormality. BONES AND SOFT TISSUES: Normal marrow signal. No acute  soft tissue abnormality. IMPRESSION: 1. No acute intracranial abnormality. 2. Minimal nonspecific white matter T2-weighted signal hyperintensities, possibly related to early chronic small vessel disease or migraine headaches. Electronically signed by: Franky Stanford MD 06/02/2024 01:44 PM EDT RP Workstation: [id number]   CT ANGIO HEAD NECK W WO CM Result Date: 06/01/2024 CLINICAL DATA:  Initial evaluation for acute visual loss. EXAM: CT ANGIOGRAPHY HEAD AND NECK WITH AND WITHOUT CONTRAST TECHNIQUE: Multidetector CT imaging of the head and neck was performed using the standard protocol during bolus administration of intravenous contrast. Multiplanar CT image reconstructions and MIPs were obtained to evaluate the vascular anatomy. Carotid stenosis measurements (when applicable) are obtained utilizing NASCET criteria, using the distal internal carotid diameter as the denominator. RADIATION DOSE REDUCTION: This exam was performed according to the departmental dose-optimization program which includes automated exposure control, adjustment of the mA and/or kV according to patient size and/or use of iterative reconstruction technique. CONTRAST:  60mL OMNIPAQUE  IOHEXOL  300 MG/ML  SOLN COMPARISON:  CT from earlier the same day. FINDINGS: CTA NECK FINDINGS Aortic arch: Visualized aortic arch within normal limits for caliber with standard branch pattern. Moderate aortic atherosclerosis. Sequelae of prior CABG noted. No significant stenosis about the origin the great vessels. Right carotid system: Right common and internal carotid arteries are patent without dissection. Atheromatous change about the right carotid bulb with associated stenosis of up to 55% by NASCET criteria. Left carotid system: Left common and internal carotid arteries are patent without dissection. Mild atheromatous change about the left common carotid artery without hemodynamically significant greater than 50% stenosis. Vertebral arteries: Both vertebral  arteries arise from subclavian arteries. 55% stenosis noted involving the proximal left subclavian artery prior to the takeoff of the left vertebral artery (series 6, image 299). Left vertebral artery dominant. Atheromatous change at the origin of the right vertebral artery with severe stenosis. Vertebral arteries otherwise patent without stenosis or dissection. Skeleton: No worrisome osseous lesions. Moderate spondylosis at C4-5 through C6-7. Prior sternotomy noted. Other neck: No other acute finding. Upper chest: No other acute finding. Review of the MIP images confirms the above findings CTA HEAD FINDINGS Anterior circulation: Atheromatous change about the carotid siphons bilaterally, left worse than right. No hemodynamically significant stenosis about the right siphon. Moderate to severe stenosis at the para clinoid left ICA. A1 segments patent bilaterally. Right A1 dominant. Normal anterior communicating artery complex. Anterior cerebral arteries patent without visible stenosis. No M1 stenosis or occlusion. 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 No proximal MCA branch occlusion or high-grade stenosis. Distal MCA branches perfused and symmetric. Posterior circulation: Both V4 segments patent without stenosis. Both PICA patent. Basilar patent without stenosis. Superior cerebral arteries patent bilaterally. Left PCA supplied via the basilar. Predominant fetal type origin of the right PCA. Both PCAs patent to their distal aspects without stenosis. A small patent allowing for timing the contrast bolus.  Venous sinuses: Patent allowing for timing the contrast bolus. Anatomic variants: Fetal type right PCA. Review of the MIP images confirms the above findings IMPRESSION: 1. Negative CTA for large vessel occlusion or other emergent finding. 2. 7 x 4 x 5 mm complex bilobed aneurysm arising from the right  MCA bifurcation. Neuroendovascular consultation and referral suggested. 3. Atheromatous change about the right carotid bulb with associated stenosis of up to 55% by NASCET criteria. 4. Atheromatous change about the carotid siphons with associated moderate to severe stenosis at the para clinoid left ICA. 5. 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 6. 55% stenosis involving the proximal left subclavian artery prior to the takeoff of the left vertebral artery. Aortic Atherosclerosis (ICD10-I70.0). Electronically Signed   By: Morene Hoard M.D.   On: 06/01/2024 23:43   CT HEAD WO CONTRAST Result Date: 06/01/2024 EXAM: CT HEAD WITHOUT CONTRAST 06/01/2024 06:08:19 PM TECHNIQUE: CT of the head was performed without the administration of intravenous contrast. Automated exposure control, iterative reconstruction, and/or weight based adjustment of the mA/kV was utilized to reduce the radiation dose to as low as reasonably achievable. COMPARISON: 10/19/2010 CLINICAL HISTORY: Neuro deficit, acute, stroke suspected. Blurred vision. FINDINGS: BRAIN AND VENTRICLES: No acute hemorrhage. No evidence of acute infarct. No hydrocephalus. No extra-axial collection. No mass effect or midline shift. There is diffuse cerebral atrophy and chronic small vessel disease throughout the deep white matter. ORBITS: No acute abnormality. SINUSES: No acute abnormality. SOFT TISSUES AND SKULL: No acute soft tissue abnormality. No skull fracture. IMPRESSION: 1. No acute intracranial abnormality. 2. Diffuse cerebral atrophy and chronic small vessel disease throughout the deep white matter. Electronically signed by: Franky Crease MD 06/01/2024 06:20 PM EDT RP Workstation: [id number]     Assessment/Plan Carotid artery stenosis He had a CT angiogram of the head and neck which I have independently reviewed.  He was found to have an intracranial aneurysm and has been referred to neurosurgery in [place].  He also had an  approximately 55% stenosis of his cervical right carotid bifurcation.  This has progressed from her previous studies where he had been in the more mild range.  He is status post left carotid endarterectomy several years ago and this is widely patent.  We performed a carotid duplex that was actually scheduled for his annual follow-up.  His carotid velocities have increased and fall in the 40 to 59% range in the internal carotid artery but the greater than 50% range in the distal common carotid artery on the right side.  This would correlate with the CT angiogram.  His left carotid velocities remain in the 1 to 39% range status post endarterectomy in the past. I had a long discussion with he and his daughter today.  He is borderline but approaching threshold for consideration for intervention for his carotid stenosis in the cervical portion with clear symptoms.  His symptoms certainly could have been related to this.  He also has an intracranial aneurysm and is scheduled to see neurosurgical/neurointerventional specialist in [place] later this week.  They did not remember the name of the provider but both of the providers in this practice are excellent.  For now, I will plan on shortening his follow-up with a carotid duplex and if he has any progression would certainly consider either carotid stenting or carotid endarterectomy for his cervical carotid disease.  He is on Eliquis , aspirin , and Lipitor which is appropriate medical therapy.  I will plan to see him back in 3  months.  Complication of arteriovenous dialysis fistula No new issues with the access.  We are doing a duplex every few years.  Essential (primary) hypertension blood pressure control important in reducing the progression of atherosclerotic disease. On appropriate oral medications.     Diabetes (HCC) blood glucose control important in reducing the progression of atherosclerotic disease. Also, involved in wound healing. On appropriate  medications.     Hypercholesteremia lipid control important in reducing the progression of atherosclerotic disease. Continue statin therapy  Selinda Gu, MD  06/05/2024 9:03 AM    This note was created with Dragon medical transcription system.  Any errors from dictation are purely unintentional

## 2024-06-05 NOTE — Assessment & Plan Note (Signed)
 No new issues with the access.  We are doing a duplex every few years.

## 2024-06-05 NOTE — Telephone Encounter (Signed)
 Patient was referred to home health during hospital discharge. I have not received any home health orders for the patient so far. We will need an appointment for hospital follow up for me to take over home health orders. At the meantime I recommend continue home health for PT as recommended and referred during his hospital discharge.   Thank you,  Luke Shade, MD

## 2024-06-05 NOTE — Assessment & Plan Note (Signed)
 He had a CT angiogram of the head and neck which I have independently reviewed.  He was found to have an intracranial aneurysm and has been referred to neurosurgery in [place].  He also had an approximately 55% stenosis of his cervical right carotid bifurcation.  This has progressed from her previous studies where he had been in the more mild range.  He is status post left carotid endarterectomy several years ago and this is widely patent.  We performed a carotid duplex that was actually scheduled for his annual follow-up.  His carotid velocities have increased and fall in the 40 to 59% range in the internal carotid artery but the greater than 50% range in the distal common carotid artery on the right side.  This would correlate with the CT angiogram.  His left carotid velocities remain in the 1 to 39% range status post endarterectomy in the past. I had a long discussion with he and his daughter today.  He is borderline but approaching threshold for consideration for intervention for his carotid stenosis in the cervical portion with clear symptoms.  His symptoms certainly could have been related to this.  He also has an intracranial aneurysm and is scheduled to see neurosurgical/neurointerventional specialist in [place] later this week.  They did not remember the name of the provider but both of the providers in this practice are excellent.  For now, I will plan on shortening his follow-up with a carotid duplex and if he has any progression would certainly consider either carotid stenting or carotid endarterectomy for his cervical carotid disease.  He is on Eliquis , aspirin , and Lipitor which is appropriate medical therapy.  I will plan to see him back in 3 months.

## 2024-06-05 NOTE — Telephone Encounter (Signed)
 Noted

## 2024-06-06 NOTE — Telephone Encounter (Signed)
 Cindy from [facility] call and has been made aware of provider notations pertaining to initial PT inquiry

## 2024-06-07 ENCOUNTER — Encounter: Payer: Self-pay | Admitting: Neuroradiology

## 2024-06-07 ENCOUNTER — Ambulatory Visit (INDEPENDENT_AMBULATORY_CARE_PROVIDER_SITE_OTHER): Admitting: Neuroradiology

## 2024-06-07 VITALS — BP 166/74 | HR 50 | Ht 68.0 in | Wt 163.0 lb

## 2024-06-07 DIAGNOSIS — I671 Cerebral aneurysm, nonruptured: Secondary | ICD-10-CM | POA: Diagnosis not present

## 2024-06-07 NOTE — Progress Notes (Signed)
 Chief Complaint: Patient was seen in consultation today for  Chief Complaint  Patient presents with   New Patient (Initial Visit)    Pt has a aneurysm that was found after a weekend in the hospital. Pt has no new complaints     at the request of Bair,Kalpana  Referring Physician(s): Zakai, Gonyea is a 68 y.o. 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  I have explained that aneurysms may bleed (rupture), and if this happens it can be fatal or disabling.  I have also explained that some aneurysms have a very low statistical risk of bleeding, in which case the risk of surgery may be higher than the risk of no surgery.  In this particular case I estimated the risk of rupture of the aneurysm around 1 %/year and the risk of surgery around 3% for significant complication.  - Discussed surgical intervention with family and patient. - Consider surgical intervention based on patient's decision. - Schedule follow-up imaging in one year if no intervention.  I will follow-up with him and his daughter on the telephone in about 10 days.  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  No cervical carotid stenosis.     Discussed the use of AI scribe software for clinical note transcription with the patient, who gave verbal consent to proceed.  History of Present Illness [name] is a 68 year old male with a history of open-heart surgery and dialysis who presented with vision loss in the right eye.  He experienced sudden vision loss in his right eye while driving . Initially, he could not see out of his right eye and confirmed the issue by covering his left eye in front of a mirror. 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 Currently, he has very limited vision in the right eye.  He has a history of three heart attacks and underwent open-heart surgery in 2008. No current chest pain, breathing problems, or weakness in his arms or legs. No episodes of speech difficulties or additional strokes.   He denies smoking or alcohol use.  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  I reviewed the CT arteriogram and CT scan from 06/01/2024 as well as the brain MRI from 06/02/2024 and his carotid ultrasound from 06/05/2024.    Past Medical History:  Diagnosis Date   Acute exacerbation of CHF (congestive heart failure) (HCC) 12/16/2023   Acute on chronic congestive heart failure (HCC) 11/03/2023   Adenomatous polyp of colon 01/30/2024   AKI (acute kidney injury) 12/16/2023   Aortic atherosclerosis    Arthritis    BPH with LUTS s/p UroLift with Dr. Penne in November 2024 on finasteride , Flomax , and Sanctura   and sees [facility]    BPH with LUTS, s/p urolift with Dr. Penne 06/2023, on Finasteride , Flomax , Sancture, sees    Carotid arterial disease    a.) s/p LEFT CEA on 09/06/2019   Chest pain 09/20/2017   Colon cancer screening 01/30/2024   Coronary artery disease    a.) MI with stents x 2 in 2004; b.) MI with stent x 1 2005; c.) s/p CABG x 2 06/02/2006 (LIMA-LAD, SVG-PDA); d.) LHC/PCI 08/25/2007: 80% oD1 (2.5 x 12 mm Xience V DES)  DDD (degenerative disc disease), lumbar    Dyspepsia 01/30/2024   Dyspnea    ESRD (end stage renal disease) (HCC)    a.) s/p RIGHT renal 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/2015   GERD (gastroesophageal reflux disease)    History of blood transfusion    Hospitalization within last 30 days 06/03/2020   Hypertension    Long term current use of aspirin     Long term current use of immunosuppressive drug    a.) mycophenolate  + tacrolimus    Myocardial infarction [facility]) 2004   a.) details unclear; stents x 2 (unknown type/location)   Myocardial  infarction (HCC) 2005   a.) details unclear; stent x 1 (unknown type/location)   On apixaban  therapy    Orthopnea 12/16/2023   PAF (paroxysmal atrial fibrillation) (HCC)    a.) CHA2DS2-VASc = 5 (age, HTN, vascular disease history/MI, T2DM) as of 06/10/2023; b.) cardiac rate/rhythm maintained intrinsically without pharmacological intervention; chronically anticoagulated using apixaban    Sepsis due to gram-negative UTI (HCC) 04/27/2020   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   From DM and HTN, f/u with [facility] every 6 months   Subconjunctival hemorrhage of right eye 12/15/2023   T2DM (type 2 diabetes mellitus) (HCC)    Unstable angina (HCC)    Vitamin D deficiency     Past Surgical History:  Procedure Laterality Date   CATARACT EXTRACTION Bilateral    CATARACT EXTRACTION W/PHACO Right 02/03/2017   Procedure: CATARACT EXTRACTION PHACO AND INTRAOCULAR LENS PLACEMENT (IOC);  Surgeon: Mittie Gaskin, MD;  Location: [facility] ORS;  Service: Ophthalmology;  Laterality: Right;  US  00:35.8 AP% 12.8 CDE 4.57 Fluid lot # [id number]   COLONOSCOPY N/A 01/30/2024   Procedure: COLONOSCOPY;  Surgeon: Therisa Bi, MD;  Location: [facility] ENDOSCOPY;  Service: Gastroenterology;  Laterality: N/A;  IDDM   COLONOSCOPY WITH PROPOFOL  N/A 12/30/2021   Procedure: COLONOSCOPY WITH PROPOFOL ;  Surgeon: Therisa Bi, MD;  Location[facility] ENDOSCOPY;  Service: Gastroenterology;  Laterality: N/A;   CORONARY ANGIOPLASTY WITH STENT PLACEMENT Left 2004   stents x 2   CORONARY ANGIOPLASTY WITH STENT PLACEMENT Left 2005   stent x 1   CORONARY ANGIOPLASTY WITH STENT PLACEMENT Left 08/25/2007   Procedure: CORONARY ANGIOPLASTY WITH STENT PLACEMENT; Location: [facility]; Surgeon: Valinda Cage, MD   CORONARY ARTERY BYPASS GRAFT N/A 06/08/2006   Procedure: CORONARY ARTERY BYPASS GRAFT; Location: [facility]; Surgeon: Maude Sharps, MD   CYSTOSCOPY WITH INSERTION OF UROLIFT N/A 06/13/2023   Procedure: CYSTOSCOPY WITH INSERTION OF UROLIFT;   Surgeon: Penne Knee, MD;  Location: [facility] ORS;  Service: Urology;  Laterality: N/A;   ENDARTERECTOMY Left 09/06/2019   Procedure: ENDARTERECTOMY CAROTID;  Surgeon: Marea Selinda RAMAN, MD;  Location: [facility] ORS;  Service: Vascular;  Laterality: Left;   ESOPHAGOGASTRODUODENOSCOPY N/A 01/30/2024   Procedure: EGD (ESOPHAGOGASTRODUODENOSCOPY);  Surgeon: Therisa Bi, MD;  Location: [facility] ENDOSCOPY;  Service: Gastroenterology;  Laterality: N/A;   HIP FRACTURE SURGERY     KIDNEY TRANSPLANT Right 05/10/2015   LEFT HEART CATH AND CORONARY ANGIOGRAPHY Left 05/20/2006   Procedure: LEFT HEART CATH AND CORONARY ANGIOGRAPHY; Location: [facility]; Surgeon: Denyse Bathe, MD   LEFT HEART CATH AND CORONARY ANGIOGRAPHY Left 11/08/2023   Procedure: LEFT HEART CATH AND CORONARY ANGIOGRAPHY with possible intervention;  Surgeon: Bathe Denyse LABOR, MD;  Location: [facility] INVASIVE CV LAB;  Service: Cardiovascular;  Laterality: Left;   LEFT HEART CATH AND CORS/GRAFTS ANGIOGRAPHY Left 08/10/2007   Procedure: LEFT HEART CATH AND CORS/GRAFTS ANGIOGRAPHY; Location: [facility]; Surgeon: Denyse Bathe, MD   LEFT HEART CATH AND CORS/GRAFTS ANGIOGRAPHY Left  05/04/2011   Procedure: LEFT HEART CATH AND CORS/GRAFTS ANGIOGRAPHY; Location: [facility]; Surgeon: Denyse Bathe, MD   POLYPECTOMY  01/30/2024   Procedure: POLYPECTOMY, INTESTINE;  Surgeon: Therisa Bi, MD;  Location: [facility] ENDOSCOPY;  Service: Gastroenterology;;    Allergies: Oxybutynin  and Trospium  chloride  Medications: Prior to Admission medications   Medication Sig Start Date End Date Taking? Authorizing Provider  amiodarone  (PACERONE ) 200 MG tablet Take 1 tablet (200 mg total) by mouth daily. 01/05/24  Yes Bathe Denyse LABOR, MD  ascorbic acid (VITAMIN C) 500 MG tablet Take 500 mg by mouth daily.   Yes [provider]  aspirin  EC 81 MG EC tablet Take 1 tablet (81 mg total) by mouth daily at 6 (six) AM. 09/08/19  Yes Stegmayer, Suzen LABOR, PA-C  atorvastatin  (LIPITOR) 40 MG tablet Take 40 mg by  mouth at bedtime. 12/23/14  Yes [provider]  CELLCEPT  250 MG capsule Take 500 mg by mouth 2 (two) times daily. 08/12/21  Yes [provider]  Cholecalciferol  (VITAMIN D) 50 MCG (2000 UT) CAPS Take 2,000 Units by mouth daily.   Yes [provider]  Continuous Blood Gluc Sensor (FREESTYLE LIBRE 2 SENSOR) MISC by Does not apply route.   Yes [provider]  Continuous Glucose Sensor (FREESTYLE LIBRE 2 PLUS SENSOR) MISC 1 each by Other route every 14 (fourteen) days. 12/22/23  Yes [provider]  ELIQUIS  5 MG TABS tablet Take 5 mg by mouth 2 (two) times daily.  04/21/18  Yes [provider]  finasteride  (PROSCAR ) 5 MG tablet Take 1 tablet (5 mg total) by mouth daily. 01/24/24  Yes McGowan, Clotilda A, PA-C  guaiFENesin -codeine 100-10 MG/5ML syrup Take 5 mLs by mouth every 6 (six) hours as needed. 05/04/24  Yes Paduchowski, Franky, MD  HUMALOG KWIKPEN 100 UNIT/ML KiwkPen Inject 5-10 Units into the skin 3 (three) times daily. Inject 10 units daily at breakfast, 10 units at lunch and up to 10 units or less at supper per SSI   Yes [provider]  hydrALAZINE  (APRESOLINE ) 25 MG tablet Take 1 tablet (25 mg total) by mouth 2 (two) times daily. 01/23/24 01/22/25 Yes Bathe Denyse LABOR, MD  insulin  glargine (LANTUS  SOLOSTAR) 100 UNIT/ML Solostar Pen Inject 30 Units into the skin at bedtime. 12/22/23  Yes [provider]  Insulin  Pen Needle 33G X 4 MM MISC To use with insulin  pen 4 times per day. E 13.9 02/19/16  Yes [provider]  linagliptin  (TRADJENTA ) 5 MG TABS tablet Take 1 tablet (5 mg total) by mouth daily. 02/21/24  Yes Bair, Kalpana, MD  losartan  (COZAAR ) 100 MG tablet Take 1 tablet (100 mg total) by mouth daily. 11/11/23 11/10/24 Yes Bathe Denyse LABOR, MD  nitroGLYCERIN  (NITROSTAT ) 0.4 MG SL tablet Place 1 tablet (0.4 mg total) under the tongue every 5 (five) minutes as needed for chest pain. 11/11/23 11/10/24 Yes Bathe Denyse LABOR, MD   omeprazole (PRILOSEC) 20 MG capsule Take 20 mg by mouth daily.   Yes [provider]  PROGRAF  1 MG capsule Take 1 mg by mouth every morning. 08/11/16  Yes [provider]  tacrolimus  (PROGRAF ) 0.5 MG capsule Take 0.5 mg by mouth at bedtime. 09/08/23  Yes [provider]  tamsulosin  (FLOMAX ) 0.4 MG CAPS capsule Take 2 capsules (0.8 mg total) by mouth daily. 03/06/24  Yes McGowan, Clotilda A, PA-C  torsemide (DEMADEX) 10 MG tablet Take 1 tablet (10 mg total) by mouth 2 (two) times daily. 05/11/24 05/11/25 Yes Bathe, Denyse  A, MD  albuterol (VENTOLIN HFA) 108 (90 Base) MCG/ACT inhaler Inhale 2 puffs into the lungs every 6 (six) hours as needed for wheezing or shortness of breath. Patient not taking: Reported on 06/07/2024 05/04/24   Dorothyann Drivers, MD     Family History  Problem Relation Age of Onset   Osteoporosis Mother    Drug abuse Sister    Drug abuse Brother     Social History   Socioeconomic History   Marital status: Married    Spouse name: Not on file   Number of children: Not on file   Years of education: Not on file   Highest education level: 5th grade  Occupational History   Not on file  Tobacco Use   Smoking status: Former   Smokeless tobacco: Never   Tobacco comments:    30 years ago  Vaping Use   Vaping status: Never Used  Substance and Sexual Activity   Alcohol use: No    Alcohol/week: 0.0 standard drinks of alcohol   Drug use: Never   Sexual activity: Not Currently  Other Topics Concern   Not on file  Social History Narrative   married   Social Drivers of Corporate Investment Banker Strain: Low Risk  (02/20/2024)   Overall Financial Resource Strain (CARDIA)    Difficulty of Paying Living Expenses: Not hard at all  Food Insecurity: No Food Insecurity (06/02/2024)   Hunger Vital Sign    Worried About Running Out of Food in the Last Year: Never true    Ran Out of Food in the Last Year: Never true  Transportation Needs: No  Transportation Needs (06/02/2024)   PRAPARE - Administrator, Civil Service (Medical): No    Lack of Transportation (Non-Medical): No  Physical Activity: Sufficiently Active (02/20/2024)   Exercise Vital Sign    Days of Exercise per Week: 5 days    Minutes of Exercise per Session: 60 min  Stress: No Stress Concern Present (02/20/2024)   [employer] of Occupational Health - Occupational Stress Questionnaire    Feeling of Stress: Not at all  Social Connections: Moderately Integrated (06/02/2024)   Social Connection and Isolation Panel    Frequency of Communication with Friends and Family: More than three times a week    Frequency of Social Gatherings with Friends and Family: More than three times a week    Attends Religious Services: More than 4 times per year    Active Member of [employer] or Organizations: No    Attends Banker Meetings: Never    Marital Status: Married    Review of Systems  Respiratory:  Negative for shortness of breath.   Cardiovascular:  Negative for chest pain.    Vital Signs:  BP (!) 166/74   Pulse (!) 50   Ht 5' 8 (1.727 m)   Wt 163 lb (73.9 kg)   SpO2 98%   BMI 24.78 kg/m   Physical Exam CHEST: Clear to auscultation bilaterally. CARDIOVASCULAR: Normal heart sounds. EXTREMITIES: Dialysis fistula in left upper arm. NEUROLOGICAL: Full visual fields in left eye, limited vision in right eye. No sensory deficits or neglect.  Imaging:  Results RADIOLOGY As noted above there is a 7 mm aneurysm arising from the right middle cerebral artery bifurcation.  Labs:  CBC: Recent Labs    05/11/24 1053 05/21/24 1135 06/01/24 1744 06/03/24 1129  WBC 7.6 6.5 7.1 7.1  HGB 13.9 13.7 14.0 13.9  HCT 42.8 42.1 43.3 40.5  PLT  202 177 162 132*    COAGS: Recent Labs    06/01/24 1734  INR 1.5*  APTT 30    BMP: Recent Labs    05/04/24 1113 05/11/24 1053 05/21/24 1135 06/01/24 1744 06/03/24 1129  NA 137 139 135 132* 137  K  4.1 4.6 4.0 4.0 3.8  CL 106 102 100 100 102  CO2 22 21 23 22  20*  GLUCOSE 237* 169* 237* 192* 114*  BUN 34* 32* 71* 41* 39*  CALCIUM  8.2* 9.0 8.8* 8.3* 8.3*  CREATININE 1.74* 1.39* 2.13* 1.86* 1.79*  GFRNONAA 42*  --  33* 39* 41*    LIVER FUNCTION TESTS: Recent Labs    05/11/24 1053 05/21/24 1135 06/01/24 1744 06/03/24 1129  BILITOT 0.7 1.2 1.1 1.0  AST 27 36 30 29  ALT 30 37 30 29  ALKPHOS 54 41 45 43  PROT 6.7 7.3 7.3 7.1  ALBUMIN 4.0 3.9 4.1 3.6    I spent more than 45 minutes with the review of his neuroimaging, review of his history, examination and extensive consultation with the patient and his family.  Electronically Signed: Nancyann LULLA Burns  06/07/2024, 3:45 PM

## 2024-06-08 DIAGNOSIS — Z8673 Personal history of transient ischemic attack (TIA), and cerebral infarction without residual deficits: Secondary | ICD-10-CM | POA: Diagnosis not present

## 2024-06-08 DIAGNOSIS — E1122 Type 2 diabetes mellitus with diabetic chronic kidney disease: Secondary | ICD-10-CM | POA: Diagnosis not present

## 2024-06-08 DIAGNOSIS — Z79620 Long term (current) use of immunosuppressive biologic: Secondary | ICD-10-CM | POA: Diagnosis not present

## 2024-06-08 DIAGNOSIS — R338 Other retention of urine: Secondary | ICD-10-CM | POA: Diagnosis not present

## 2024-06-08 DIAGNOSIS — M1990 Unspecified osteoarthritis, unspecified site: Secondary | ICD-10-CM | POA: Diagnosis not present

## 2024-06-08 DIAGNOSIS — I6521 Occlusion and stenosis of right carotid artery: Secondary | ICD-10-CM | POA: Diagnosis not present

## 2024-06-08 DIAGNOSIS — I130 Hypertensive heart and chronic kidney disease with heart failure and stage 1 through stage 4 chronic kidney disease, or unspecified chronic kidney disease: Secondary | ICD-10-CM | POA: Diagnosis not present

## 2024-06-08 DIAGNOSIS — H3411 Central retinal artery occlusion, right eye: Secondary | ICD-10-CM | POA: Diagnosis not present

## 2024-06-08 DIAGNOSIS — K219 Gastro-esophageal reflux disease without esophagitis: Secondary | ICD-10-CM | POA: Diagnosis not present

## 2024-06-08 DIAGNOSIS — N401 Enlarged prostate with lower urinary tract symptoms: Secondary | ICD-10-CM | POA: Diagnosis not present

## 2024-06-08 DIAGNOSIS — Z7984 Long term (current) use of oral hypoglycemic drugs: Secondary | ICD-10-CM | POA: Diagnosis not present

## 2024-06-08 DIAGNOSIS — I5022 Chronic systolic (congestive) heart failure: Secondary | ICD-10-CM | POA: Diagnosis not present

## 2024-06-08 DIAGNOSIS — Z7982 Long term (current) use of aspirin: Secondary | ICD-10-CM | POA: Diagnosis not present

## 2024-06-08 DIAGNOSIS — N1832 Chronic kidney disease, stage 3b: Secondary | ICD-10-CM | POA: Diagnosis not present

## 2024-06-08 DIAGNOSIS — Z794 Long term (current) use of insulin: Secondary | ICD-10-CM | POA: Diagnosis not present

## 2024-06-08 DIAGNOSIS — E7800 Pure hypercholesterolemia, unspecified: Secondary | ICD-10-CM | POA: Diagnosis not present

## 2024-06-08 DIAGNOSIS — Z7901 Long term (current) use of anticoagulants: Secondary | ICD-10-CM | POA: Diagnosis not present

## 2024-06-08 DIAGNOSIS — M51369 Other intervertebral disc degeneration, lumbar region without mention of lumbar back pain or lower extremity pain: Secondary | ICD-10-CM | POA: Diagnosis not present

## 2024-06-08 DIAGNOSIS — Z79621 Long term (current) use of calcineurin inhibitor: Secondary | ICD-10-CM | POA: Diagnosis not present

## 2024-06-08 DIAGNOSIS — I480 Paroxysmal atrial fibrillation: Secondary | ICD-10-CM | POA: Diagnosis not present

## 2024-06-08 DIAGNOSIS — I2510 Atherosclerotic heart disease of native coronary artery without angina pectoris: Secondary | ICD-10-CM | POA: Diagnosis not present

## 2024-06-08 DIAGNOSIS — E559 Vitamin D deficiency, unspecified: Secondary | ICD-10-CM | POA: Diagnosis not present

## 2024-06-08 DIAGNOSIS — I700 Atherosclerosis of aorta: Secondary | ICD-10-CM | POA: Diagnosis not present

## 2024-06-08 DIAGNOSIS — I252 Old myocardial infarction: Secondary | ICD-10-CM | POA: Diagnosis not present

## 2024-06-11 ENCOUNTER — Ambulatory Visit (INDEPENDENT_AMBULATORY_CARE_PROVIDER_SITE_OTHER)

## 2024-06-11 ENCOUNTER — Telehealth: Payer: Self-pay

## 2024-06-11 ENCOUNTER — Ambulatory Visit: Payer: Self-pay

## 2024-06-11 DIAGNOSIS — I480 Paroxysmal atrial fibrillation: Secondary | ICD-10-CM

## 2024-06-11 DIAGNOSIS — I25708 Atherosclerosis of coronary artery bypass graft(s), unspecified, with other forms of angina pectoris: Secondary | ICD-10-CM

## 2024-06-11 DIAGNOSIS — I5020 Unspecified systolic (congestive) heart failure: Secondary | ICD-10-CM

## 2024-06-11 DIAGNOSIS — I6523 Occlusion and stenosis of bilateral carotid arteries: Secondary | ICD-10-CM

## 2024-06-11 DIAGNOSIS — I10 Essential (primary) hypertension: Secondary | ICD-10-CM

## 2024-06-11 DIAGNOSIS — I700 Atherosclerosis of aorta: Secondary | ICD-10-CM

## 2024-06-11 DIAGNOSIS — I340 Nonrheumatic mitral (valve) insufficiency: Secondary | ICD-10-CM | POA: Diagnosis not present

## 2024-06-11 DIAGNOSIS — I361 Nonrheumatic tricuspid (valve) insufficiency: Secondary | ICD-10-CM | POA: Diagnosis not present

## 2024-06-11 DIAGNOSIS — R0989 Other specified symptoms and signs involving the circulatory and respiratory systems: Secondary | ICD-10-CM

## 2024-06-11 NOTE — Telephone Encounter (Signed)
 Copied from CRM [id number]. Topic: Clinical - Home Health Verbal Orders >> Jun 11, 2024  2:22 PM Taleah C wrote: Caller/Agency: hadassah reinhold Lenny Mitch Number: [phone] Service Requested: Physical Therapy Frequency: 1x week for 1 week, 2x week for 3, 1x week for 5 weeks Any new concerns about the patient? No

## 2024-06-11 NOTE — Telephone Encounter (Signed)
 Spoke with [name] and provided her with verbal OK for order per Dr Abbey. [name] verbalized understanding and has no further questions at this time.

## 2024-06-11 NOTE — Telephone Encounter (Signed)
 Okay to send verbal order for home health PT. Has appointment with me on 06/14/24 for hospital follow up.   Luke Shade, MD

## 2024-06-11 NOTE — Progress Notes (Signed)
 To be discussed during upcoming visit.  Luke Shade, MD

## 2024-06-14 ENCOUNTER — Ambulatory Visit

## 2024-06-14 VITALS — BP 110/60 | HR 47 | Temp 97.8°F | Ht 68.0 in | Wt 164.8 lb

## 2024-06-14 DIAGNOSIS — I6523 Occlusion and stenosis of bilateral carotid arteries: Secondary | ICD-10-CM | POA: Diagnosis not present

## 2024-06-14 DIAGNOSIS — I729 Aneurysm of unspecified site: Secondary | ICD-10-CM | POA: Diagnosis not present

## 2024-06-14 DIAGNOSIS — R269 Unspecified abnormalities of gait and mobility: Secondary | ICD-10-CM

## 2024-06-14 DIAGNOSIS — Z09 Encounter for follow-up examination after completed treatment for conditions other than malignant neoplasm: Secondary | ICD-10-CM

## 2024-06-14 DIAGNOSIS — R2681 Unsteadiness on feet: Secondary | ICD-10-CM | POA: Diagnosis not present

## 2024-06-14 DIAGNOSIS — G453 Amaurosis fugax: Secondary | ICD-10-CM

## 2024-06-14 DIAGNOSIS — N1831 Chronic kidney disease, stage 3a: Secondary | ICD-10-CM

## 2024-06-14 DIAGNOSIS — Z794 Long term (current) use of insulin: Secondary | ICD-10-CM

## 2024-06-14 DIAGNOSIS — E1122 Type 2 diabetes mellitus with diabetic chronic kidney disease: Secondary | ICD-10-CM

## 2024-06-14 NOTE — Progress Notes (Signed)
 Established Patient Office Visit   Subjective  Patient ID: [name], male    DOB: 28-Nov-1955  Age: 68 y.o. MRN: [id number]  Chief Complaint  Patient presents with   Hospitalization Follow-up    Discussed the use of AI scribe software for clinical note transcription with the patient, who gave verbal consent to proceed.  History of Present Illness [name] is a 68 year old male who presents for follow-up after hospital discharge.  He was hospitalized from October 31 to June 03, 2024, due to acute right sided vision loss. Since his hospital discharge patient reports overall he has been doing well. He has noted significant improvement in right sided vision. Has not noted new or worsening vision changes, headache, palpitations, chest pain, lower leg swelling, change in appetite.   He continues to struggle with unsteady gait, ongoing for 10 plus years. He is currently receiving home health physical therapy twice a week but feels it is not beneficial as he does not experience any symptoms during the sessions. He performs exercises at home daily and does not wish to continue with home health services. He mentions a need for a ramp at home and has assistance from a service referred to as 'papa' through his insurance, who told patient he needs doctor's order to build a ramp. He is currently using a walking cane for ambulation.    His blood pressure has been fluctuating, with readings at home ranging from 112/50 to 160/61. He monitors his blood pressure regularly and reports no symptoms such as dizziness or weakness. His hemoglobin A1c was 6.7% during his hospital stay, and he manages his diabetes with insulin , taking 10 units at night, sometimes adjusting to 8 units, and linagliptin  5 mg.   Admit 06/01/24-06/03/24 for evaluation of sudden loss of vision on right side concerning for CVA event. Hospital discharge notes, labs, imaging reviewed.   Since hospital discharge, he has  seen vascular surgeon Dr. Marea on 06/05/24, who suspects unilateral right sided vision loss may have been secondary to stenosis cervical right carotid bifurcation.  He recommends f/u in 3 months, and may need further intervention for carotid artery stenosis after carotid duplex showed worsening carotid artery stenosis on the right.    He is s/p left carotid endarterectomy in the past.   He saw neurosurgeon Dr. Nancyann Burns [facility]) on 06/07/2024. He recommends surgical intervention for the 7 mm right MCA aneurysm. He has a follow up appointment with Dr. Burns on 06/18/2024.     ROS As per HPI    Objective:     BP 110/60   Pulse (!) 47   Temp 97.8 F (36.6 C) (Oral)   Ht 5' 8 (1.727 m)   Wt 164 lb 12.8 oz (74.8 kg)   SpO2 97%   BMI 25.06 kg/m       06/14/2024    2:01 PM 02/21/2024   11:01 AM 11/14/2023    9:10 AM  Depression screen PHQ 2/9  Decreased Interest 0 0 0  Down, Depressed, Hopeless 0 0 0  PHQ - 2 Score 0 0 0  Altered sleeping 0 0 0  Tired, decreased energy 0 0 0  Change in appetite 0 0 0  Feeling bad or failure about yourself  0 0 0  Trouble concentrating 0 0 0  Moving slowly or fidgety/restless 0 0 0  Suicidal thoughts 0 0 0  PHQ-9 Score 0 0  0   Difficult doing work/chores Not difficult at all  Not difficult at all      Data saved with a previous flowsheet row definition      06/14/2024    2:01 PM 11/14/2023    9:10 AM  GAD 7 : Generalized Anxiety Score  Nervous, Anxious, on Edge 0 0  Control/stop worrying 0 0  Worry too much - different things 0 0  Trouble relaxing 0 0  Restless 0 0  Easily annoyed or irritable 0 0  Afraid - awful might happen 0 0  Total GAD 7 Score 0 0  Anxiety Difficulty Not difficult at all       06/14/2024    2:01 PM 02/21/2024   11:01 AM 11/14/2023    9:10 AM  Depression screen PHQ 2/9  Decreased Interest 0 0 0  Down, Depressed, Hopeless 0 0 0  PHQ - 2 Score 0 0 0  Altered sleeping 0 0 0  Tired, decreased energy 0 0 0   Change in appetite 0 0 0  Feeling bad or failure about yourself  0 0 0  Trouble concentrating 0 0 0  Moving slowly or fidgety/restless 0 0 0  Suicidal thoughts 0 0 0  PHQ-9 Score 0 0  0   Difficult doing work/chores Not difficult at all Not difficult at all      Data saved with a previous flowsheet row definition      06/14/2024    2:01 PM 11/14/2023    9:10 AM  GAD 7 : Generalized Anxiety Score  Nervous, Anxious, on Edge 0 0  Control/stop worrying 0 0  Worry too much - different things 0 0  Trouble relaxing 0 0  Restless 0 0  Easily annoyed or irritable 0 0  Afraid - awful might happen 0 0  Total GAD 7 Score 0 0  Anxiety Difficulty Not difficult at all    SDOH Screenings   Food Insecurity: No Food Insecurity (06/02/2024)  Housing: Unknown (06/02/2024)  Transportation Needs: No Transportation Needs (06/02/2024)  Utilities: Not At Risk (06/02/2024)  Alcohol Screen: Low Risk  (02/20/2024)  Depression (PHQ2-9): Low Risk  (06/14/2024)  Financial Resource Strain: Low Risk  (02/20/2024)  Physical Activity: Sufficiently Active (02/20/2024)  Social Connections: Moderately Integrated (06/02/2024)  Stress: No Stress Concern Present (02/20/2024)  Tobacco Use: Medium Risk (06/14/2024)  Health Literacy: Adequate Health Literacy (02/21/2024)     Physical Exam Constitutional:      General: He is not in acute distress. HENT:     Head: Normocephalic and atraumatic.     Right Ear: Tympanic membrane normal.     Left Ear: Tympanic membrane normal.  Eyes:     Conjunctiva/sclera: Conjunctivae normal.     Comments: Wearing glasses, b/l pupil equal and reactive   Cardiovascular:     Rate and Rhythm: Normal rate.  Pulmonary:     Effort: Pulmonary effort is normal.     Breath sounds: Normal breath sounds. No wheezing.  Abdominal:     Palpations: Abdomen is soft.     Tenderness: There is no abdominal tenderness. There is no guarding.  Musculoskeletal:     Cervical back: Neck supple.      Right lower leg: No edema.     Left lower leg: No edema.  Lymphadenopathy:     Cervical: No cervical adenopathy.  Skin:    General: Skin is warm.  Neurological:     Mental Status: He is alert and oriented to person, place, and time.     Gait: Gait abnormal and  tandem walk abnormal.  Psychiatric:        Attention and Perception: Attention normal.        Behavior: Behavior is cooperative.        No results found for any visits on 06/14/24.  The ASCVD Risk score (Arnett DK, et al., 2019) failed to calculate for the following reasons:   Risk score cannot be calculated because patient has a medical history suggesting prior/existing ASCVD     Following imaging reviewed:  06/01/24: Head CT: without acute intracranial abnormality, positive for diffuse cerebral atrophy and chronic small vessel disease throughout the deep white matter.  CTA head and neck:  - 7 x 4 x 5 mm complex bilobed aneurysm arising from the right MCA bifurcation.  - Atheromatous change about the right carotid bulb with associated stenosis of up to 55% by NASCET criteria. -Atheromatous change about the carotid siphons with associated moderate to severe stenosis at the para clinoid left ICA. - Severe stenosis at the origin of the right vertebral artery. - 55% stenosis involving the proximal left subclavian artery prior to the takeoff of the left vertebral artery.  06/02/2024, MRI brain: 1. No acute intracranial abnormality. 2. Minimal nonspecific white matter T2-weighted signal hyperintensities, possibly related to early chronic small vessel disease or migraine headaches.   Assessment & Plan:  BP liable patient asymptomatic,  recommend he continues to check BP at home, recommend take home BP reading during upcoming cardiology appointment with Dr. Fernand.  Assessment & Plan Hospital discharge follow-up Reviewed hospital discharge note, imaging. Reviewed his follow up visit with vascular surgery and neurosurgery.  Overall patient is feeling better, slowly improved vision on right side.   Recommend continue close follow up with vascular surgery, he is planning on proceeding with potential surgical repair of incidental 7 x 4 x 5 mm complex bilobed aneurysm arising from the right MCA bifurcation.  Encourage patient be evaluated in the ED promptly if stroke like symptoms occurs.  Plan per gait difficulty as well.  Orders:   Ambulatory referral to Home Health  Unsteady gait Plan per gait difficulty  Orders:   Ambulatory referral to Home Health  Type 2 diabetes mellitus with stage 3a chronic kidney disease, with long-term current use of insulin  (HCC) Chronic, stable. His diabetes is managed by endocrinologist Dr. Damian at [facility]. Reviewed A1c from 06/03/24 which was 6 %, continue home BP monitoring/with CGM.  Continue daily Trajenta 5 mg daily.      Amaurosis fugax of right eye Vision improved. Suspected carotid artery stenosis right. Goal BP <130/80 mmhg. Continue atorvastatin  40 mg daily, aspirin  81 mg daily, Eliquis  5 mg BID. Continue close follow up with vascular surgery.  Continue monitoring vision changes, if worsening or new symptoms recommend prompt ED evaluation.    Bilateral carotid artery stenosis Bilateral carotid artery disease with right side worsening. Left side surgically treated. Vision changes potentially related. Continue follow-up with vascular surgery.     Gait difficulty Chronic gait instability and balance impairment since 2010. Increased risk of fall due to fluctuating BP, arrhythmia, multiple co-morbidities, on eliquis . Recommend continue home health (PT, OT, social worker to evaluate for assistance with mobility, balance, evaluation for home assessments to match patient's needs including but not limited to recommend specific recommendation for ramp if necessary, wheel chair if necessary.     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 Discussed potential risks, recommend BP, cholesterol, BG  control when he waits to follow up with neurosurgeon Dr. Lester.      I personally spent a total of 45 minutes in the care of the patient today including preparing to see the patient, getting/reviewing separately obtained history, performing a medically appropriate exam/evaluation, counseling and educating, referring and communicating with other health care professionals, documenting clinical information in the EHR, independently interpreting results, communicating results, and coordinating care.   Return in about 2 months (around 08/14/2024) for Chronic follow up .   Luke Shade, MD

## 2024-06-14 NOTE — Assessment & Plan Note (Signed)
 Bilateral carotid artery disease with right side worsening. Left side surgically treated. Vision changes potentially related. Continue follow-up with vascular surgery.

## 2024-06-14 NOTE — Assessment & Plan Note (Addendum)
 Chronic, stable. His diabetes is managed by endocrinologist Dr. Damian at [facility]. Reviewed A1c from 06/03/24 which was 6 %, continue home BP monitoring/with CGM.  Continue daily Trajenta 5 mg daily.

## 2024-06-14 NOTE — Assessment & Plan Note (Signed)
 7 x 4 x 5 mm complex bilobed aneurysm arising from the right MCA Bifurcation, unruptured. Discussed potential risks, recommend BP, cholesterol, BG control when he waits to follow up with neurosurgeon Dr. Lester.

## 2024-06-14 NOTE — Assessment & Plan Note (Signed)
 Vision improved. Suspected carotid artery stenosis right. Goal BP <130/80 mmhg. Continue atorvastatin  40 mg daily, aspirin  81 mg daily, Eliquis  5 mg BID. Continue close follow up with vascular surgery.  Continue monitoring vision changes, if worsening or new symptoms recommend prompt ED evaluation.

## 2024-06-14 NOTE — Assessment & Plan Note (Signed)
 Chronic gait instability and balance impairment since 2010. Increased risk of fall due to fluctuating BP, arrhythmia, multiple co-morbidities, on eliquis . Recommend continue home health (PT, OT, social worker to evaluate for assistance with mobility, balance, evaluation for home assessments to match patient's needs including but not limited to recommend specific recommendation for ramp if necessary, wheel chair if necessary.

## 2024-06-18 ENCOUNTER — Ambulatory Visit: Admitting: Neuroradiology

## 2024-06-18 ENCOUNTER — Telehealth: Admitting: Neuroradiology

## 2024-06-18 DIAGNOSIS — I671 Cerebral aneurysm, nonruptured: Secondary | ICD-10-CM

## 2024-06-18 NOTE — Progress Notes (Signed)
 I spoke with his daughter Odetta on the telephone today.  He has considered the options of treating the aneurysm versus waiting, and would like to treat the aneurysm but would also like to wait until after the holidays.  I will schedule an appointment to see him back second week of January.

## 2024-06-19 LAB — OPHTHALMOLOGY REPORT-SCANNED

## 2024-06-20 ENCOUNTER — Ambulatory Visit: Payer: Self-pay

## 2024-06-20 ENCOUNTER — Telehealth: Admitting: Neuroradiology

## 2024-06-20 NOTE — Progress Notes (Signed)
 Noted.  Jacklin Mascot, MD

## 2024-06-21 ENCOUNTER — Telehealth: Payer: Self-pay

## 2024-06-21 NOTE — Telephone Encounter (Signed)
 Copied from CRM #[id number]. Topic: Clinical - Home Health Verbal Orders >> Jun 21, 2024 12:01 PM Ashley R wrote: Caller/Agency:Cindy ([facility]) Callback Number: [phone] Service Requested: Physical Therapy Any new concerns about the patient? No, patient is on hold as authorization for insurance is still In process.

## 2024-06-21 NOTE — Telephone Encounter (Signed)
 Noted.  Jacklin Mascot, MD

## 2024-06-22 ENCOUNTER — Encounter: Payer: Self-pay | Admitting: Cardiovascular Disease

## 2024-06-22 ENCOUNTER — Ambulatory Visit (INDEPENDENT_AMBULATORY_CARE_PROVIDER_SITE_OTHER): Admitting: Cardiovascular Disease

## 2024-06-22 VITALS — BP 112/60 | HR 58 | Ht 68.0 in | Wt 162.8 lb

## 2024-06-22 DIAGNOSIS — Z8679 Personal history of other diseases of the circulatory system: Secondary | ICD-10-CM | POA: Diagnosis not present

## 2024-06-22 DIAGNOSIS — Z0130 Encounter for examination of blood pressure without abnormal findings: Secondary | ICD-10-CM

## 2024-06-22 DIAGNOSIS — Z940 Kidney transplant status: Secondary | ICD-10-CM

## 2024-06-22 DIAGNOSIS — I480 Paroxysmal atrial fibrillation: Secondary | ICD-10-CM

## 2024-06-22 DIAGNOSIS — R001 Bradycardia, unspecified: Secondary | ICD-10-CM

## 2024-06-22 DIAGNOSIS — E7800 Pure hypercholesterolemia, unspecified: Secondary | ICD-10-CM

## 2024-06-22 DIAGNOSIS — Z131 Encounter for screening for diabetes mellitus: Secondary | ICD-10-CM

## 2024-06-22 DIAGNOSIS — I5020 Unspecified systolic (congestive) heart failure: Secondary | ICD-10-CM

## 2024-06-22 NOTE — Progress Notes (Signed)
 Cardiology Office Note   Date:  06/22/2024   ID:  [name], [name] 28-Feb-1956, MRN [id number]  PCP:  Abbey Bruckner, MD  Cardiologist:  Denyse Bathe, MD      History of Present Illness: [name] is a 68 y.o. male who presents for  Chief Complaint  Patient presents with   Follow-up    4 month follow up & echo results    Feeling better, less SOB, and no chest pains.      Past Medical History:  Diagnosis Date   Acute exacerbation of CHF (congestive heart failure) (HCC) 12/16/2023   Acute on chronic congestive heart failure (HCC) 11/03/2023   Adenomatous polyp of colon 01/30/2024   AKI (acute kidney injury) 12/16/2023   Aortic atherosclerosis    Arthritis    BPH with LUTS s/p UroLift with Dr. Penne in November 2024 on finasteride , Flomax , and Sanctura   and sees [facility]    BPH with LUTS, s/p urolift with Dr. Penne 06/2023, on Finasteride , Flomax , Sancture, sees    Carotid arterial disease    a.) s/p LEFT CEA on 09/06/2019   Chest pain 09/20/2017   Colon cancer screening 01/30/2024   Coronary artery disease    a.) MI with stents x 2 in 2004; b.) MI with stent x 1 2005; c.) s/p CABG x 2 06/02/2006 (LIMA-LAD, SVG-PDA); d.) LHC/PCI 08/25/2007: 80% oD1 (2.5 x 12 mm Xience V DES)   DDD (degenerative disc disease), lumbar    Dyspepsia 01/30/2024   Dyspnea    ESRD (end stage renal disease) (HCC)    a.) s/p RIGHT renal 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/2015   GERD (gastroesophageal reflux disease)    History of blood transfusion    Hospitalization within last 30 days 06/03/2020   Hypertension    Long term current use of aspirin     Long term current use of immunosuppressive drug    a.) mycophenolate  + tacrolimus    Myocardial infarction [facility]) 2004   a.) details unclear; stents x 2 (unknown type/location)   Myocardial infarction (HCC) 2005   a.) details unclear; stent x 1 (unknown type/location)   On apixaban  therapy    Orthopnea 12/16/2023   PAF (paroxysmal atrial  fibrillation) (HCC)    a.) CHA2DS2-VASc = 5 (age, HTN, vascular disease history/MI, T2DM) as of 06/10/2023; b.) cardiac rate/rhythm maintained intrinsically without pharmacological intervention; chronically anticoagulated using apixaban    Sepsis due to gram-negative UTI (HCC) 04/27/2020   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   From DM and HTN, f/u with [facility] every 6 months   Stroke (HCC) 06/02/2024   Subconjunctival hemorrhage of right eye 12/15/2023   T2DM (type 2 diabetes mellitus) (HCC)    Unstable angina (HCC)    Vitamin D deficiency      Past Surgical History:  Procedure Laterality Date   CATARACT EXTRACTION Bilateral    CATARACT EXTRACTION W/PHACO Right 02/03/2017   Procedure: CATARACT EXTRACTION PHACO AND INTRAOCULAR LENS PLACEMENT (IOC);  Surgeon: Mittie Gaskin, MD;  Location: [facility] ORS;  Service: Ophthalmology;  Laterality: Right;  US  00:35.8 AP% 12.8 CDE 4.57 Fluid lot # [id number]   COLONOSCOPY N/A 01/30/2024   Procedure: COLONOSCOPY;  Surgeon: Therisa Bi, MD;  Location: [facility] ENDOSCOPY;  Service: Gastroenterology;  Laterality: N/A;  IDDM   COLONOSCOPY WITH PROPOFOL  N/A 12/30/2021   Procedure: COLONOSCOPY WITH PROPOFOL ;  Surgeon: Therisa Bi, MD;  Location: [facility] ENDOSCOPY;  Service: Gastroenterology;  Laterality: N/A;   CORONARY ANGIOPLASTY WITH STENT PLACEMENT Left 2004   stents  x 2   CORONARY ANGIOPLASTY WITH STENT PLACEMENT Left 2005   stent x 1   CORONARY ANGIOPLASTY WITH STENT PLACEMENT Left 08/25/2007   Procedure: CORONARY ANGIOPLASTY WITH STENT PLACEMENT; Location: [facility]; Surgeon: Valinda Cage, MD   CORONARY ARTERY BYPASS GRAFT N/A 06/08/2006   Procedure: CORONARY ARTERY BYPASS GRAFT; Location: [facility]; Surgeon: Maude Sharps, MD   CYSTOSCOPY WITH INSERTION OF UROLIFT N/A 06/13/2023   Procedure: CYSTOSCOPY WITH INSERTION OF UROLIFT;  Surgeon: Penne Knee, MD;  Location: [facility] ORS;  Service: Urology;  Laterality: N/A;   ENDARTERECTOMY Left 09/06/2019    Procedure: ENDARTERECTOMY CAROTID;  Surgeon: Marea Selinda RAMAN, MD;  Location: [facility] ORS;  Service: Vascular;  Laterality: Left;   ESOPHAGOGASTRODUODENOSCOPY N/A 01/30/2024   Procedure: EGD (ESOPHAGOGASTRODUODENOSCOPY);  Surgeon: Therisa Bi, MD;  Location: [facility] ENDOSCOPY;  Service: Gastroenterology;  Laterality: N/A;   HIP FRACTURE SURGERY     KIDNEY TRANSPLANT Right 05/10/2015   LEFT HEART CATH AND CORONARY ANGIOGRAPHY Left 05/20/2006   Procedure: LEFT HEART CATH AND CORONARY ANGIOGRAPHY; Location: [facility]; Surgeon: Denyse Bathe, MD   LEFT HEART CATH AND CORONARY ANGIOGRAPHY Left 11/08/2023   Procedure: LEFT HEART CATH AND CORONARY ANGIOGRAPHY with possible intervention;  Surgeon: Bathe Denyse LABOR, MD;  Location: [facility] INVASIVE CV LAB;  Service: Cardiovascular;  Laterality: Left;   LEFT HEART CATH AND CORS/GRAFTS ANGIOGRAPHY Left 08/10/2007   Procedure: LEFT HEART CATH AND CORS/GRAFTS ANGIOGRAPHY; Location: [facility]; Surgeon: Denyse Bathe, MD   LEFT HEART CATH AND CORS/GRAFTS ANGIOGRAPHY Left 05/04/2011   Procedure: LEFT HEART CATH AND CORS/GRAFTS ANGIOGRAPHY; Location: [facility]; Surgeon: Denyse Bathe, MD   POLYPECTOMY  01/30/2024   Procedure: POLYPECTOMY, INTESTINE;  Surgeon: Therisa Bi, MD;  Location: [facility] ENDOSCOPY;  Service: Gastroenterology;;     Current Outpatient Medications  Medication Sig Dispense Refill   amiodarone  (PACERONE ) 200 MG tablet Take 1 tablet (200 mg total) by mouth daily. 90 tablet 1   ascorbic acid (VITAMIN C) 500 MG tablet Take 500 mg by mouth daily.     aspirin  EC 81 MG EC tablet Take 1 tablet (81 mg total) by mouth daily at 6 (six) AM.     atorvastatin  (LIPITOR) 40 MG tablet Take 40 mg by mouth at bedtime.     CELLCEPT  250 MG capsule Take 500 mg by mouth 2 (two) times daily.     Cholecalciferol  (VITAMIN D) 50 MCG (2000 UT) CAPS Take 2,000 Units by mouth daily.     Continuous Blood Gluc Sensor (FREESTYLE LIBRE 2 SENSOR) MISC by Does not apply route.     Continuous Glucose Sensor  (FREESTYLE LIBRE 2 PLUS SENSOR) MISC 1 each by Other route every 14 (fourteen) days.     ELIQUIS  5 MG TABS tablet Take 5 mg by mouth 2 (two) times daily.      finasteride  (PROSCAR ) 5 MG tablet Take 1 tablet (5 mg total) by mouth daily. 90 tablet 3   HUMALOG KWIKPEN 100 UNIT/ML KiwkPen Inject 5-10 Units into the skin 3 (three) times daily. Inject 10 units daily at breakfast, 10 units at lunch and up to 10 units or less at supper per SSI     hydrALAZINE  (APRESOLINE ) 25 MG tablet Take 1 tablet (25 mg total) by mouth 2 (two) times daily. 120 tablet 11   insulin  glargine (LANTUS  SOLOSTAR) 100 UNIT/ML Solostar Pen Inject 30 Units into the skin at bedtime.     Insulin  Pen Needle 33G X 4 MM MISC To use with insulin  pen 4 times per day. E 13.9  linagliptin  (TRADJENTA ) 5 MG TABS tablet Take 1 tablet (5 mg total) by mouth daily. 90 tablet 1   losartan  (COZAAR ) 100 MG tablet Take 1 tablet (100 mg total) by mouth daily. 30 tablet 11   nitroGLYCERIN  (NITROSTAT ) 0.4 MG SL tablet Place 1 tablet (0.4 mg total) under the tongue every 5 (five) minutes as needed for chest pain. 100 tablet 3   omeprazole (PRILOSEC) 20 MG capsule Take 20 mg by mouth daily.     PROGRAF  1 MG capsule Take 1 mg by mouth every morning.     tacrolimus  (PROGRAF ) 0.5 MG capsule Take 0.5 mg by mouth at bedtime.     tamsulosin  (FLOMAX ) 0.4 MG CAPS capsule Take 2 capsules (0.8 mg total) by mouth daily. 180 capsule 3   torsemide  (DEMADEX ) 10 MG tablet Take 1 tablet (10 mg total) by mouth 2 (two) times daily. 60 tablet 11   No current facility-administered medications for this visit.   Facility-Administered Medications Ordered in Other Visits  Medication Dose Route Frequency Provider Last Rate Last Admin   sodium chloride  flush (NS) 0.9 % injection 3 mL  3 mL Intravenous Q12H Fernand Alter A, MD        Allergies:   Oxybutynin  and Trospium  chloride    Social History:   reports that he has quit smoking. He has never used smokeless tobacco.  He reports that he does not drink alcohol and does not use drugs.   Family History:  family history includes Drug abuse in his brother and sister; Osteoporosis in his mother.    ROS:     Review of Systems  Constitutional: Negative.   HENT: Negative.    Eyes: Negative.   Respiratory: Negative.    Gastrointestinal: Negative.   Genitourinary: Negative.   Musculoskeletal: Negative.   Skin: Negative.   Neurological: Negative.   Endo/Heme/Allergies: Negative.   Psychiatric/Behavioral: Negative.    All other systems reviewed and are negative.     All other systems are reviewed and negative.    PHYSICAL EXAM: VS:  BP 112/60   Pulse (!) 58   Ht 5' 8 (1.727 m)   Wt 162 lb 12.8 oz (73.8 kg)   SpO2 93%   BMI 24.75 kg/m  , BMI Body mass index is 24.75 kg/m. Last weight:  Wt Readings from Last 3 Encounters:  06/22/24 162 lb 12.8 oz (73.8 kg)  06/14/24 164 lb 12.8 oz (74.8 kg)  06/07/24 163 lb (73.9 kg)     Physical Exam Vitals reviewed.  Constitutional:      Appearance: Normal appearance. He is normal weight.  HENT:     Head: Normocephalic.     Nose: Nose normal.     Mouth/Throat:     Mouth: Mucous membranes are moist.  Eyes:     Pupils: Pupils are equal, round, and reactive to light.  Cardiovascular:     Rate and Rhythm: Normal rate and regular rhythm.     Pulses: Normal pulses.     Heart sounds: Normal heart sounds.  Pulmonary:     Effort: Pulmonary effort is normal.  Abdominal:     General: Abdomen is flat. Bowel sounds are normal.  Musculoskeletal:        General: Normal range of motion.     Cervical back: Normal range of motion.  Skin:    General: Skin is warm.  Neurological:     General: No focal deficit present.     Mental Status: He is alert.  Psychiatric:  Mood and Affect: Mood normal.       EKG:   Recent Labs: 12/16/2023: B Natriuretic Peptide 684.4; TSH 2.987 12/18/2023: Magnesium  2.0 05/01/2024: Pro B Natriuretic peptide (BNP)  461.0 06/03/2024: ALT 29; BUN 39; Creatinine, Ser 1.79; Hemoglobin 13.9; Platelets 132; Potassium 3.8; Sodium 137    Lipid Panel    Component Value Date/Time   CHOL 108 06/03/2024 0542   TRIG 63 06/03/2024 0542   HDL 35 (L) 06/03/2024 0542   CHOLHDL 3.1 06/03/2024 0542   VLDL 13 06/03/2024 0542   LDLCALC 60 06/03/2024 0542      Other studies Reviewed: Additional studies/ records that were reviewed today include:  Review of the above records demonstrates:       No data to display            ASSESSMENT AND PLAN:    ICD-10-CM   1. Heart failure with mildly reduced ejection fraction (HFmrEF) (HCC)  I50.20    LVEF 41.6 LVEF grade 2 diastoli c dysfunction,but in NSR as was in afib ana may have caused this.    2. Paroxysmal atrial fibrillation (HCC)  I48.0     3. right renal transplant on 05/10/2015, f/u with [facility] transplant team q6 monthly  Z94.0     4. Sinus bradycardia  R00.1     5. Atrial fibrillation, currently in sinus rhythm  Z86.79    IN NSR    6. Hypercholesteremia  E78.00        Problem List Items Addressed This Visit       Cardiovascular and Mediastinum   Paroxysmal atrial fibrillation (HCC) (Chronic)   Heart failure with mildly reduced ejection fraction (HFmrEF) (HCC) - Primary (Chronic)     Other   right renal transplant on 05/10/2015, f/u with [facility] transplant team q6 monthly (Chronic)   Hypercholesteremia   Other Visit Diagnoses       Sinus bradycardia         Atrial fibrillation, currently in sinus rhythm       IN NSR          Disposition:   Return in about 2 months (around 08/22/2024).    Total time spent: 40 minutes  Signed,  Denyse Bathe, MD  06/22/2024 9:30 AM    [facility]

## 2024-06-25 DIAGNOSIS — Z1331 Encounter for screening for depression: Secondary | ICD-10-CM | POA: Diagnosis not present

## 2024-06-25 DIAGNOSIS — Z9289 Personal history of other medical treatment: Secondary | ICD-10-CM | POA: Diagnosis not present

## 2024-06-25 DIAGNOSIS — E1122 Type 2 diabetes mellitus with diabetic chronic kidney disease: Secondary | ICD-10-CM | POA: Diagnosis not present

## 2024-06-25 DIAGNOSIS — E1159 Type 2 diabetes mellitus with other circulatory complications: Secondary | ICD-10-CM | POA: Diagnosis not present

## 2024-06-25 DIAGNOSIS — I671 Cerebral aneurysm, nonruptured: Secondary | ICD-10-CM | POA: Diagnosis not present

## 2024-06-25 DIAGNOSIS — Z794 Long term (current) use of insulin: Secondary | ICD-10-CM | POA: Diagnosis not present

## 2024-06-25 DIAGNOSIS — N1831 Chronic kidney disease, stage 3a: Secondary | ICD-10-CM | POA: Diagnosis not present

## 2024-06-25 DIAGNOSIS — I10 Essential (primary) hypertension: Secondary | ICD-10-CM | POA: Diagnosis not present

## 2024-06-26 NOTE — Progress Notes (Unsigned)
 06/27/2024 3:12 PM   [name] 1955/08/12 [id number]  Referring provider: Abbey Bruckner, MD [address] [place],  [place] [address]  Urological history: 1. BPH with LU TS - PSA (01/2024) 0.2 - TRUS (2022) 43 cc - cysto (2022) NED - UDS (2024) non diagnostic - UroLift (06/2023)  - managed on finasteride  5 mg daily, tamsulosin  0.8 mg daily  2. Nocturia - oxybutynin  caused retention/severe constipation  - Trospium  caused retention  3. Renal transplant  - serum creatinine (01/2024) 1.18, eGFR 67 - right renal transplant (2016)  - followed by [facility]  4. ED  5. Pyelonephritis  - hospitalized (2021)  No chief complaint on file.  HPI: [name] is a 68 y.o. man who presents today for having trouble using the restroom and just dribbling.  Previous records reviewed.   At his visit on 05/28/2024, he was seen in the emergency department yesterday for the complaints of not being able to urinate since the evening before.  He stated that he was having issues for the past couple days with being able to empty his bladder while urinating.  He is complaining of pain in his bladder and pain in his penis.  He has a strong urge to urinate, but he could only urinate a drop at a time.  He has bladder scan noted 0 residual.  His urinalysis was yellow clear, specific gravity 1.018, pH 5.0, 50 glucose.  His BUN was 71 and his serum creatinine was 2.13 with a eGFR of 33.  CBC with differential was negative.  His Foley was removed this morning.  He has voided 5 times without difficulty today.  He states that prior to his visit to the emergency department, he was having an upper respiratory infection and was taking OTC medications for cough and this may have contributed to the urinary retention.  Patient denies any modifying or aggravating factors.  Patient denies any recent UTI's, gross hematuria, dysuria or suprapubic/flank pain.  Patient denies any fevers, chills, nausea or  vomiting.   PVR 11 mL.  He was going to continue tamsulosin  0.4 mg finasteride  5 mg daily.  He was to follow-up in 1 month.  I PSS ***  He reports sensation of incomplete bladder emptying,   urinary frequency,   urinary intermittency,   urinary urgency,   a weak urinary stream,   having to strain to void,   nocturia x ***,   leaking before being able to reach the restroom,   leaking with coughing,   leaking without awareness,   and post void dribbling.     He is wearing *** pads//depends  daily.    Patient denies any modifying or aggravating factors.  Patient denies any recent UTI's, gross hematuria, dysuria or suprapubic/flank pain.  Patient denies any fevers, chills, nausea or vomiting.  ***  He has a family history of PCa, colon cancer, ovarian cancer and/or breast cancer with ***.   He does not have a family history of PCa, colon cancer, ovarian cancer, and/or breast cancer .***     UA clear   PVR***  PSA (01/2024) 0.2   Serum creatinine 1.79, eGFR 41  Hemoglobin A1c 6.7  BPH meds: Finasteride  5 mg daily and tamsulosin  0.8 mg daily   PMH: Past Medical History:  Diagnosis Date   Acute exacerbation of CHF (congestive heart failure) (HCC) 12/16/2023   Acute on chronic congestive heart failure (HCC) 11/03/2023   Adenomatous polyp of colon 01/30/2024   AKI (acute  kidney injury) 12/16/2023   Aortic atherosclerosis    Arthritis    BPH with LUTS s/p UroLift with Dr. Penne in November 2024 on finasteride , Flomax , and Sanctura   and sees [facility]    BPH with LUTS, s/p urolift with Dr. Penne 06/2023, on Finasteride , Flomax , Sancture, sees    Carotid arterial disease    a.) s/p LEFT CEA on 09/06/2019   Chest pain 09/20/2017   Colon cancer screening 01/30/2024   Coronary artery disease    a.) MI with stents x 2 in 2004; b.) MI with stent x 1 2005; c.) s/p CABG x 2 06/02/2006 (LIMA-LAD, SVG-PDA); d.) LHC/PCI 08/25/2007: 80% oD1 (2.5 x 12 mm Xience V DES)    DDD (degenerative disc disease), lumbar    Dyspepsia 01/30/2024   Dyspnea    ESRD (end stage renal disease) (HCC)    a.) s/p RIGHT renal 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/2015   GERD (gastroesophageal reflux disease)    History of blood transfusion    Hospitalization within last 30 days 06/03/2020   Hypertension    Long term current use of aspirin     Long term current use of immunosuppressive drug    a.) mycophenolate  + tacrolimus    Myocardial infarction [facility]) 2004   a.) details unclear; stents x 2 (unknown type/location)   Myocardial infarction (HCC) 2005   a.) details unclear; stent x 1 (unknown type/location)   On apixaban  therapy    Orthopnea 12/16/2023   PAF (paroxysmal atrial fibrillation) (HCC)    a.) CHA2DS2-VASc = 5 (age, HTN, vascular disease history/MI, T2DM) as of 06/10/2023; b.) cardiac rate/rhythm maintained intrinsically without pharmacological intervention; chronically anticoagulated using apixaban    Sepsis due to gram-negative UTI (HCC) 04/27/2020   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   From DM and HTN, f/u with [facility] every 6 months   Stroke (HCC) 06/02/2024   Subconjunctival hemorrhage of right eye 12/15/2023   T2DM (type 2 diabetes mellitus) (HCC)    Unstable angina (HCC)    Vitamin D deficiency     Surgical History: Past Surgical History:  Procedure Laterality Date   CATARACT EXTRACTION Bilateral    CATARACT EXTRACTION W/PHACO Right 02/03/2017   Procedure: CATARACT EXTRACTION PHACO AND INTRAOCULAR LENS PLACEMENT (IOC);  Surgeon: Mittie Gaskin, MD;  Location: [facility] ORS;  Service: Ophthalmology;  Laterality: Right;  US  00:35.8 AP% 12.8 CDE 4.57 Fluid lot # [id number]   COLONOSCOPY N/A 01/30/2024   Procedure: COLONOSCOPY;  Surgeon: Therisa Bi, MD;  Location: [facility] ENDOSCOPY;  Service: Gastroenterology;  Laterality: N/A;  IDDM   COLONOSCOPY WITH PROPOFOL  N/A 12/30/2021   Procedure: COLONOSCOPY WITH PROPOFOL ;  Surgeon: Therisa Bi, MD;  Location: [facility] ENDOSCOPY;   Service: Gastroenterology;  Laterality: N/A;   CORONARY ANGIOPLASTY WITH STENT PLACEMENT Left 2004   stents x 2   CORONARY ANGIOPLASTY WITH STENT PLACEMENT Left 2005   stent x 1   CORONARY ANGIOPLASTY WITH STENT PLACEMENT Left 08/25/2007   Procedure: CORONARY ANGIOPLASTY WITH STENT PLACEMENT; Location: [facility]; Surgeon: Valinda Cage, MD   CORONARY ARTERY BYPASS GRAFT N/A 06/08/2006   Procedure: CORONARY ARTERY BYPASS GRAFT; Location: [facility]; Surgeon: Maude Sharps, MD   CYSTOSCOPY WITH INSERTION OF UROLIFT N/A 06/13/2023   Procedure: CYSTOSCOPY WITH INSERTION OF UROLIFT;  Surgeon: Penne Knee, MD;  Location: [facility] ORS;  Service: Urology;  Laterality: N/A;   ENDARTERECTOMY Left 09/06/2019   Procedure: ENDARTERECTOMY CAROTID;  Surgeon: Marea Selinda RAMAN, MD;  Location: [facility] ORS;  Service: Vascular;  Laterality: Left;   ESOPHAGOGASTRODUODENOSCOPY N/A 01/30/2024  Procedure: EGD (ESOPHAGOGASTRODUODENOSCOPY);  Surgeon: Therisa Bi, MD;  Location: [facility] ENDOSCOPY;  Service: Gastroenterology;  Laterality: N/A;   HIP FRACTURE SURGERY     KIDNEY TRANSPLANT Right 05/10/2015   LEFT HEART CATH AND CORONARY ANGIOGRAPHY Left 05/20/2006   Procedure: LEFT HEART CATH AND CORONARY ANGIOGRAPHY; Location: [facility]; Surgeon: Denyse Bathe, MD   LEFT HEART CATH AND CORONARY ANGIOGRAPHY Left 11/08/2023   Procedure: LEFT HEART CATH AND CORONARY ANGIOGRAPHY with possible intervention;  Surgeon: Bathe Denyse LABOR, MD;  Location: [facility] INVASIVE CV LAB;  Service: Cardiovascular;  Laterality: Left;   LEFT HEART CATH AND CORS/GRAFTS ANGIOGRAPHY Left 08/10/2007   Procedure: LEFT HEART CATH AND CORS/GRAFTS ANGIOGRAPHY; Location: [facility]; Surgeon: Denyse Bathe, MD   LEFT HEART CATH AND CORS/GRAFTS ANGIOGRAPHY Left 05/04/2011   Procedure: LEFT HEART CATH AND CORS/GRAFTS ANGIOGRAPHY; Location: [facility]; Surgeon: Denyse Bathe, MD   POLYPECTOMY  01/30/2024   Procedure: POLYPECTOMY, INTESTINE;  Surgeon: Therisa Bi, MD;  Location: [facility] ENDOSCOPY;   Service: Gastroenterology;;    Home Medications:  Allergies as of 06/27/2024       Reactions   Oxybutynin  Other (See Comments)   Incomplete bladder emptying   Trospium  Chloride Other (See Comments)   Urinary Retention        Medication List        Accurate as of June 26, 2024  3:12 PM. If you have any questions, ask your nurse or doctor.          amiodarone  200 MG tablet Commonly known as: PACERONE  Take 1 tablet (200 mg total) by mouth daily.   ascorbic acid 500 MG tablet Commonly known as: VITAMIN C Take 500 mg by mouth daily.   aspirin  EC 81 MG tablet Take 1 tablet (81 mg total) by mouth daily at 6 (six) AM.   atorvastatin  40 MG tablet Commonly known as: LIPITOR Take 40 mg by mouth at bedtime.   CellCept  250 MG capsule Generic drug: mycophenolate  Take 500 mg by mouth 2 (two) times daily.   Eliquis  5 MG Tabs tablet Generic drug: apixaban  Take 5 mg by mouth 2 (two) times daily.   finasteride  5 MG tablet Commonly known as: PROSCAR  Take 1 tablet (5 mg total) by mouth daily.   FreeStyle Libre 2 Sensor Misc by Does not apply route.   FreeStyle Libre 2 Plus Sensor Misc 1 each by Other route every 14 (fourteen) days.   HumaLOG KwikPen 100 UNIT/ML KwikPen Generic drug: insulin  lispro Inject 5-10 Units into the skin 3 (three) times daily. Inject 10 units daily at breakfast, 10 units at lunch and up to 10 units or less at supper per SSI   hydrALAZINE  25 MG tablet Commonly known as: APRESOLINE  Take 1 tablet (25 mg total) by mouth 2 (two) times daily.   Insulin  Pen Needle 33G X 4 MM Misc To use with insulin  pen 4 times per day. E 13.9   Lantus  SoloStar 100 UNIT/ML Solostar Pen Generic drug: insulin  glargine Inject 30 Units into the skin at bedtime.   linagliptin  5 MG Tabs tablet Commonly known as: TRADJENTA  Take 1 tablet (5 mg total) by mouth daily.   losartan  100 MG tablet Commonly known as: Cozaar  Take 1 tablet (100 mg total) by mouth daily.    nitroGLYCERIN  0.4 MG SL tablet Commonly known as: Nitrostat  Place 1 tablet (0.4 mg total) under the tongue every 5 (five) minutes as needed for chest pain.   omeprazole 20 MG capsule Commonly known as: PRILOSEC Take 20 mg by mouth daily.  Prograf  1 MG capsule Generic drug: tacrolimus  Take 1 mg by mouth every morning.   tacrolimus  0.5 MG capsule Commonly known as: PROGRAF  Take 0.5 mg by mouth at bedtime.   tamsulosin  0.4 MG Caps capsule Commonly known as: FLOMAX  Take 2 capsules (0.8 mg total) by mouth daily.   torsemide  10 MG tablet Commonly known as: DEMADEX  Take 1 tablet (10 mg total) by mouth 2 (two) times daily.   Vitamin D 50 MCG (2000 UT) Caps Take 2,000 Units by mouth daily.        Allergies:  Allergies  Allergen Reactions   Oxybutynin  Other (See Comments)    Incomplete bladder emptying   Trospium  Chloride Other (See Comments)    Urinary Retention    Family History: Family History  Problem Relation Age of Onset   Osteoporosis Mother    Drug abuse Sister    Drug abuse Brother     Social History:  reports that he has quit smoking. He has never used smokeless tobacco. He reports that he does not drink alcohol and does not use drugs.  ROS: Pertinent ROS in HPI  Physical Exam: There were no vitals taken for this visit.  Constitutional:  Well nourished. Alert and oriented, No acute distress. HEENT: [place] AT, moist mucus membranes.  Trachea midline, no masses. Cardiovascular: No clubbing, cyanosis, or edema. Respiratory: Normal respiratory effort, no increased work of breathing. GI: Abdomen is soft, non tender, non distended, no abdominal masses. Liver and spleen not palpable.  No hernias appreciated.  Stool sample for occult testing is not indicated.   GU: No CVA tenderness.  No bladder fullness or masses.  Patient with circumcised/uncircumcised phallus. ***Foreskin easily retracted***  Urethral meatus is patent.  No penile discharge. No penile lesions or  rashes. Scrotum without lesions, cysts, rashes and/or edema.  Testicles are located scrotally bilaterally. No masses are appreciated in the testicles. Left and right epididymis are normal. Rectal: Patient with  normal sphincter tone. Anus and perineum without scarring or rashes. No rectal masses are appreciated. Prostate is approximately *** grams, *** nodules are appreciated. Seminal vesicles are normal. Skin: No rashes, bruises or suspicious lesions. Lymph: No cervical or inguinal adenopathy. Neurologic: Grossly intact, no focal deficits, moving all 4 extremities. Psychiatric: Normal mood and affect.   Laboratory Data: See EPIC and HPI I have reviewed the labs.  See HPI.     Pertinent Imaging: N/A  Assessment & Plan:    1. BPH with LU TS - mild, moderate severe symptoms *** and he is *** - no signs of retention, infection or malignancy *** - PSA up to date  - UA benign  - PVR < 300 cc *** - most bothersome symptoms are *** - encouraged avoiding bladder irritants, fluid restriction before bedtime and timed voiding's - Initiate alpha-blocker (***), discussed side effects *** - Initiate 5 alpha reductase inhibitor (***), discussed side effects *** - Continue tamsulosin  0.4 mg daily, alfuzosin 10 mg daily, Rapaflo 8 mg daily, terazosin, doxazosin, Cialis 5 mg daily and finasteride  5 mg daily, dutasteride 0.5 mg daily***:refills given - Cannot tolerate medication or medication failure, schedule cystoscopy *** - educated on red flag symptoms: acute retention, gross hematuria, fever, severe pain - advised to call clinic or go to the ED if these occur - return to clinic in *** symptom re-evaluation ***    No follow-ups on file.  These notes generated with voice recognition software. I apologize for typographical errors.  [name]  [facility] [address]  [address] [place], [place] [address] [phone]

## 2024-06-27 ENCOUNTER — Encounter: Payer: Self-pay | Admitting: Urology

## 2024-06-27 ENCOUNTER — Ambulatory Visit: Admitting: Urology

## 2024-06-27 VITALS — BP 104/49 | HR 60 | Wt 161.6 lb

## 2024-06-27 DIAGNOSIS — N138 Other obstructive and reflux uropathy: Secondary | ICD-10-CM | POA: Diagnosis not present

## 2024-06-27 DIAGNOSIS — N401 Enlarged prostate with lower urinary tract symptoms: Secondary | ICD-10-CM

## 2024-06-27 DIAGNOSIS — R300 Dysuria: Secondary | ICD-10-CM | POA: Diagnosis not present

## 2024-06-27 LAB — BLADDER SCAN AMB NON-IMAGING

## 2024-06-27 NOTE — Patient Instructions (Signed)

## 2024-07-02 ENCOUNTER — Ambulatory Visit: Admit: 2024-07-02 | Discharge: 2024-07-03 | Payer: Medicare (Managed Care)

## 2024-07-02 DIAGNOSIS — Z940 Kidney transplant status: Secondary | ICD-10-CM | POA: Diagnosis not present

## 2024-07-02 DIAGNOSIS — R8279 Other abnormal findings on microbiological examination of urine: Secondary | ICD-10-CM | POA: Diagnosis not present

## 2024-07-04 ENCOUNTER — Ambulatory Visit: Admitting: Neuroradiology

## 2024-07-04 ENCOUNTER — Other Ambulatory Visit: Payer: Self-pay

## 2024-07-04 ENCOUNTER — Encounter: Payer: Self-pay | Admitting: Neuroradiology

## 2024-07-04 VITALS — BP 147/61 | HR 64 | Temp 97.4°F | Ht 68.0 in | Wt 167.2 lb

## 2024-07-04 DIAGNOSIS — I671 Cerebral aneurysm, nonruptured: Secondary | ICD-10-CM

## 2024-07-04 NOTE — Progress Notes (Signed)
 We met again in the office today.  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  I again reviewed the risks of the procedure, especially the risk of stroke and the small risk of rupture of the aneurysm which can be fatal.  We talked about the natural history of unruptured aneurysms, and the fact that some aneurysms have a very low risk of bleeding.  In his particular case, the aneurysm is a little irregular and may be at higher risk.  He feels well today. Lungs are clear. Heart sounds are normal.  His pulse is regular.  Neurologic exam is normal and unchanged.

## 2024-07-04 NOTE — Addendum Note (Signed)
 Addended by: Claudett Bayly on: 07/04/2024 04:58 PM   Modules accepted: Orders

## 2024-07-04 NOTE — Patient Instructions (Signed)
 Please see below for information in regards to your upcoming procedure.  Planned procedure : [place] of aneurysm  [address] [place], [place], [place] [address]   Pre-op appointment at [facility] Pre-admit Testing: you will receive a call with a date/time for this appointment. If you are scheduled for an in person appointment, Pre-admit Testing is located at Entrance A. You will enter and check in at patient registration. During this appointment, they will advise you which medications you can take the morning of surgery, and which medications you will need to hold for surgery. Labs (such as blood work, EKG) may be done at your pre-op appointment. You are not required to fast for these labs. Should you need to change your pre-op appointment, please call Pre-admit testing at  [phone].   Eating/ Drinking: You should not eat or drink after midnight (NPO)  before your procedure. You can have small sips of clear liquids with your morning medications but everything else should be avoided.  Blood thinners:  PER MD INSTRUCTIONS   Aspirin  81mg : KEEP TAKING  ELLIQUIS: KEEP TAKING  Insulins: Hold morning of due to concern for hypoglycemia with NPO status  How to contact us :  If you have any questions/concerns before or after your Procedure you can reach us  at [phone], or you can send a mychart message. We can be reached by phone or mychart 8am-4pm, Monday-Friday.   Doctors: Dino Sable MD, Nancyann Burns MD Surgery scheduling (Dr. Evlyn Lead: Rosaline Custard Procedure scheduling (Dr. Heck)/ Registered Nurse: Tinnie HERO RN-BSN Medical Assistants: Jesslyn DEL CMA , Avelina BIRCH CMA

## 2024-07-05 ENCOUNTER — Other Ambulatory Visit: Payer: Self-pay | Admitting: Cardiovascular Disease

## 2024-07-05 ENCOUNTER — Telehealth (HOSPITAL_COMMUNITY): Payer: Self-pay | Admitting: Radiology

## 2024-07-05 NOTE — Telephone Encounter (Signed)
 Called pt using the interpreter line. Had to leave a VM. JM

## 2024-07-06 ENCOUNTER — Encounter (HOSPITAL_COMMUNITY): Payer: Self-pay | Admitting: Neuroradiology

## 2024-07-06 ENCOUNTER — Other Ambulatory Visit: Payer: Self-pay

## 2024-07-06 NOTE — Progress Notes (Addendum)
 PCP - Dr Luke Shade Cardiologist - Dr Denyse Bathe Endocrinology - Dr Therisa Leep Urology - Clotilda Cornwall, PA-C  Chest x-ray - 05/04/24 EKG - 06/01/24 Stress Test - 10/31/23 PCV ECHO - 06/11/24 Cardiac Cath - 11/08/23  ICD Pacemaker/Loop - n/a  Sleep Study -  Yes CPAP - uses CPAP nightly, will be mask for DOS  Diabetes Type 2 FreeStyle Libre 2 System, Sensor on right arm. Fasting Blood Sugar - 90s-120s Checks Blood Sugar several times a day  Do not take Trajendta on the morning of surgery.  THE NIGHT BEFORE SURGERY, take 15 units Lantus  Insulin .       THE MORNING OF SURGERY, take 2-5 units of Humalog Insulin .  If your blood sugar is less than 70 mg/dL, you will need to treat for low blood sugar: Treat a low blood sugar (less than 70 mg/dL) with  cup of clear juice (cranberry or apple), 4 glucose tablets, OR glucose gel. Recheck blood sugar in 15 minutes after treatment (to make sure it is greater than 70 mg/dL). If your blood sugar is not greater than 70 mg/dL on recheck, call [phone] for further instructions.  Aspirin  & Eliquis  - Continue per MD  NPO  Anesthesia review: Yes  STOP now taking any Aspirin  (unless otherwise instructed by your surgeon), Aleve, Naproxen, Ibuprofen, Motrin, Advil, Goody's, BC's, all herbal medications, fish oil, and all vitamins.   Coronavirus Screening Do you have any of the following symptoms:  Cough yes/no: No Fever (>100.18F)  yes/no: No Runny nose yes/no: No Sore throat yes/no: No Difficulty breathing/shortness of breath  yes/no: No  Have you traveled in the last 14 days and where? yes/no: No  Patient verbalized understanding of instructions that were given to them via phone via Spanish Interpreter ID # [phone].

## 2024-07-06 NOTE — Anesthesia Preprocedure Evaluation (Signed)
Anesthesia Evaluation  Patient identified by MRN, date of birth, ID band Patient awake    Reviewed: Allergy & Precautions, NPO status , Patient's Chart, lab work & pertinent test results  Airway Mallampati: II  TM Distance: >3 FB Neck ROM: Full   Comment: Hx of trach in past Dental no notable dental hx. (+) Partial Upper, Dental Advisory Given   Pulmonary sleep apnea , former smoker   Pulmonary exam normal breath sounds clear to auscultation       Cardiovascular hypertension, (-) angina + CAD, + Past MI, + Cardiac Stents and +CHF  Normal cardiovascular exam+ dysrhythmias (on aphixiban) Atrial Fibrillation + Valvular Problems/Murmurs MR  Rhythm:Regular Rate:Normal   EKG: 06/01/2024: Sinus rhythm with first degree AV block  06/11/2024 TTE Technically adequate study.  Left atrium & right atrium moderately dilated.  Left ventricle & right ventricle mildly dilated  Moderate left ventricular systolic function  L ventricle ejection fraction 41.6 %,   L ventricular outflow tract internal diameter 3.1 cm, L ventricular  outflow tract flow velocity 0.85 m/s)  - Mild left ventricular hypertrophy with GRADE 2 (psuedonormalization)    Neuro/Psych CVA (Hospitalized 06/01/2024 -06/03/2024 for sudden los of right eye vision Suspected he head central retinal artery occlusion, consider embolic versus bilateral atheromatous disease.)    GI/Hepatic ,GERD  ,,  Endo/Other  diabetes, Type 2, Insulin  Dependent    Renal/GU ESRF and Renal InsufficiencyRenal diseases/p right deceased donor renal 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/10/2015; 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 Lab Results      Component                Value               Date                      K                        3.8                 06/03/2024                CO2                      20 (L)              06/03/2024                BUN                      39 (H)              06/03/2024                 CREATININE               1.79 (H)            06/03/2024                GFRNONAA                 41 (L)              06/03/2024                 GLUCOSE                  114 (H)  06/03/2024                Musculoskeletal   Abdominal   Peds  Hematology Lab Results      Component                Value               Date                      WBC                      7.1                 06/03/2024                HGB                      13.9                06/03/2024                HCT                      40.5                06/03/2024                MCV                      89.6                06/03/2024                PLT                      132 (L)             06/03/2024              Anesthesia Other Findings All: oxybutryn, tropium  Reproductive/Obstetrics                              Anesthesia Physical Anesthesia Plan  ASA: 4  Anesthesia Plan: General   Post-op Pain Management: Ofirmev  IV (intra-op)*   Induction: Intravenous  PONV Risk Score and Plan: 3 and Treatment may vary due to age or medical condition, Ondansetron , Midazolam  and Dexamethasone   Airway Management Planned: Oral ETT and Video Laryngoscope Planned  Additional Equipment: Arterial line  Intra-op Plan:   Post-operative Plan: Extubation in OR  Informed Consent: I have reviewed the patients History and Physical, chart, labs and discussed the procedure including the risks, benefits and alternatives for the proposed anesthesia with the patient or authorized representative who has indicated his/her understanding and acceptance.     Dental advisory given  Plan Discussed with: CRNA and Surgeon  Anesthesia Plan Comments: (PAT note written 07/06/2024 by Arneisha Kincannon, PA-C. Ptspanish speaking check last   )         Anesthesia Quick Evaluation

## 2024-07-06 NOTE — Progress Notes (Signed)
 Anesthesia Chart Review: [name]  Case: [id number] Date/Time: 07/09/24 0945   Procedure: RADIOLOGY WITH ANESTHESIA - RMCA aneurysm coiling   Anesthesia type: General   Diagnosis: Brain aneurysm [I67.1]   Pre-op diagnosis: Brain aneurysm   [facility]   Surgeons: Lester Golas, MD       DISCUSSION: Patient is a 68 year old male (will be 68 on 07/08/2024) scheduled for the above procedure. Hospitalized 06/01/2024 -06/03/2024 for sudden los of right eye vision. Referred by ophthalmology. Head CT without acute findings. 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 Neurology and neurosurgery were consulted. Brain MRI without acute changes. 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 He has since had follow-up with vascular surgery, neurosurgery, cardiology, primary care, endocrinology, and IR. Above procedure planned.   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    Last visit with cardiologist Dr. Fernand was on 06/22/2024 for follow-up CAD, HFmEF, PAF, Sinus brady. No chest pain or SOB. TTE 06/11/2024 showed LVEF 41.6%, mil LVH, grade 2 DD, normal RV systolic function, trace TR,  normal PASP, mild MR.  LHC on 11/08/2023 showed Patent LIMA-LAD, SVG-PDA, 50% prox and mid RCA, 40% prox-mid CX, EF 45-50%, medical therapy continued.  Routine follow-up ~ 2 months planned.    Last vascular follow-up with Dr. Marea was on 06/05/2024. He has a left brachiocephalic AVF that is mildly aneurysmal that is being monitored, although not in use since his underwent renal transplant in 2016. He noted recent admission for suspected retinal stroke. He reviewed CTA imaging that showed approximately 55% stenosis of his cervical right carotid bifurcation. This has progressed from her previous studies where he had been in the more mild range. Per his review carotid US  showed, 1-39% BICA,  His carotid velocities have increased and fall in the 40 to 59% range in the internal carotid artery but the greater than 50% range in the distal common carotid artery on the right side.  This would correlate with the CT angiogram.  His left carotid velocities remain in the 1 to 39% range status post endarterectomy in the past. I had a long discussion with he and his daughter today.  He is borderline but approaching threshold for consideration for intervention for his carotid stenosis in the cervical portion with clear symptoms.  His symptoms certainly could have been related to this.  He also has an intracranial aneurysm and is scheduled to see neurosurgical/neurointerventional specialist in [place] later this week. He will have patient follow-up after he is evaluated for his intracranial aneurysm. Continue Eliquis , ASA, and statin.   He in on Cellcept , Profgraf for immunosuppression post DDRT. He is [facility]. Last Creatinine 1.79.   A1c 6.7%. DM meds include Tradjenta , Lantus . He is not currently on semaglutide, He in on He has a [employer] CGM.  He is Spanish speaking.   Anesthesia team to evaluate on the day of surgery. He  is on ASA 81 mg, Eliquis --reportedly to continue for procedure.      VS:  Wt Readings from Last 3 Encounters:  07/04/24 75.8 kg  06/27/24 73.3 kg  06/22/24 73.8 kg   BP Readings from Last 3 Encounters:  07/04/24 (!)  147/61  06/27/24 (!) 104/49  06/22/24 112/60   Pulse Readings from Last 3 Encounters:  07/04/24 64  06/27/24 60  06/22/24 (!) 58     PROVIDERS: Abbey Bruckner, MD is PCP  Marea Mayo, MD is vascular surgeon Damian Kung, MD is endocrinologist Penne Knee, MD is urologist Jess Cue, MD is pulmonologist (sleep medicine) Sandre Norris, MD nephrologist [facility]), last visit noted 01/05/2024.   LABS: Creatinine 1.39 - 2.13 since at least May 2025. Last Cr 1.79 on 06/03/2024.  For day of procedure as indicated. Most recent results in [facility] include: Lab Results  Component Value Date   WBC 7.1 06/03/2024   HGB 13.9 06/03/2024   HCT 40.5 06/03/2024   PLT 132 (L) 06/03/2024   GLUCOSE 114 (H) 06/03/2024   CHOL 108 06/03/2024   TRIG 63 06/03/2024   HDL 35 (L) 06/03/2024   LDLCALC 60 06/03/2024   ALT 29 06/03/2024   AST 29 06/03/2024   NA 137 06/03/2024   K 3.8 06/03/2024   CL 102 06/03/2024   CREATININE 1.79 (H) 06/03/2024   BUN 39 (H) 06/03/2024   CO2 20 (L) 06/03/2024   TSH 2.987 12/16/2023   INR 1.5 (H) 06/01/2024   HGBA1C 6.7 (H) 06/03/2024     IMAGES: MRI Brain 06/02/2024: IMPRESSION: 1. No acute intracranial abnormality. 2. Minimal nonspecific white matter T2-weighted signal hyperintensities, possibly related to early chronic small vessel disease or migraine headaches.  CTA Head/Neck 06/01/2024: IMPRESSION: 1. Negative CTA for large vessel occlusion or other emergent finding. 2. 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 Neuroendovascular consultation and referral suggested. 3. Atheromatous change about the right carotid bulb with associated stenosis of up to 55% by NASCET criteria. 4. Atheromatous change about the carotid siphons with associated moderate to severe stenosis at the para clinoid left ICA. 5. 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 6. 55% stenosis involving the proximal left subclavian  artery prior to the takeoff of the left vertebral artery. - Aortic Atherosclerosis (ICD10-I70.0).   EKG: 06/01/2024: Sinus rhythm with first degree AV block Minimal voltage for LVH, may be normal variant Possible inferior infarct, age undetermined Anterior infarct, age undetermined   CV: Echo 06/11/2024: Technically adequate study.  Left atrium & right atrium moderately dilated.  Left ventricle & right ventricle mildly dilated  Moderate left ventricular systolic function  (ventricular septum thickness 0.909 cm, L ventricular posterior wall  thickness (diastole) 1.52 cm, left atrium size 4.2 cm, aortic root  diameter 3.4 cm, L ventricle diastolic dimension 4.81 cm, L ventricle  systolic dimension 3.83, L ventricle ejection fraction 41.6 %, and LV  fractional shortening 20.4 %  L ventricular outflow tract internal diameter 3.1 cm, L ventricular  outflow tract flow velocity 0.85 m/s)  - Mild left ventricular hypertrophy with GRADE 2 (psuedonormalization)  diastolic dysfunction.  - Normal right ventricular systolic function.  - Normal right ventricular diastolic function.  - Right ventricular diastolic dysfunction.  - Moderate left ventricular wall motion.  - Normal right ventricular wall motion.  - No pulmonary regurgitation.  - Trace tricuspid regurgitation.  - Normal pulmonary artery pressure.  - Mitral valve diastolic peak flow velocity E  1.09 m/sec, and mitral valve diastolic peak flow E/A ratio 1.0. Mild mitral regurgitation.  - Aortic valve cusps 2.3 cm , aortic  valve flow velocity 1.17 (m/sec), aortic valve systolic calculated mean flow gradient 3 mmHg. No aortic regurgitation.  - No pericardial effusion.    US  Carotid 06/05/2024: Summary:  - Right Carotid: Velocities in the right ICA are consistent with a 1-39% stenosis.  Non-hemodynamically significant plaque <50% noted in the CCA. The ECA appears >50% stenosed.  - Left Carotid: Velocities in the left ICA are  consistent with a 1-39% stenosis. Non-hemodynamically significant plaque <50% noted in the CCA.  - Vertebrals: Bilateral vertebral arteries demonstrate antegrade flow.  - Subclavians: Normal flow hemodynamics were seen in bilateral subclavian arteries.    Cardiac cath 11/08/2023:   Prox RCA lesion is 50% stenosed.   Mid RCA lesion is 50% stenosed.   Prox Cx to Mid Cx lesion is 40% stenosed.   Prox LAD to Mid LAD lesion is 100% stenosed.   There is moderate left ventricular systolic dysfunction.   The left ventricular ejection fraction is 45-50% by visual estimate.   LIMA to LAD and SVG to PDA patent, with echo having LVEF 45%. Treat medically as no significant disease.   Nuclear stress test 10/31/2023:  Abnormal ventricular function with evident of inferolateral hypokinesia. Fixed defect inferolaterally suggests infarction in LCX, and RCA territory with moderate LV systolic dysfunction (LVEF 43%). Patient in afib.  Past Medical History:  Diagnosis Date   Acute exacerbation of CHF (congestive heart failure) (HCC) 12/16/2023   Acute on chronic congestive heart failure (HCC) 11/03/2023   Adenomatous polyp of colon 01/30/2024   AKI (acute kidney injury) 12/16/2023   Aortic atherosclerosis    Arthritis    BPH with LUTS s/p UroLift with Dr. Penne in November 2024 on finasteride , Flomax , and Sanctura   and sees [facility]    BPH with LUTS, s/p urolift with Dr. Penne 06/2023, on Finasteride , Flomax , Sancture, sees    Carotid arterial disease    a.) s/p LEFT CEA on 09/06/2019   Chest pain 09/20/2017   Colon cancer screening 01/30/2024   Coronary artery disease    a.) MI with stents x 2 in 2004; b.) MI with stent x 1 2005; c.) s/p CABG x 2 06/02/2006 (LIMA-LAD, SVG-PDA); d.) LHC/PCI 08/25/2007: 80% oD1 (2.5 x 12 mm Xience V DES)   DDD (degenerative disc disease), lumbar    Dyspepsia 01/30/2024   Dyspnea    Hx   ESRD (end stage renal disease) (HCC)    a.) s/p RIGHT renal transplant  05/2015   GERD (gastroesophageal reflux disease)    History of blood transfusion    Hospitalization within last 30 days 06/03/2020   Hypertension    Long term current use of aspirin     Long term current use of immunosuppressive drug    a.) mycophenolate  + tacrolimus    Myocardial infarction [facility]) 2004   a.) details unclear; stents x 2 (unknown type/location)   Myocardial infarction (HCC) 2005   a.) details unclear; stent x 1 (unknown type/location)   On apixaban  therapy    Orthopnea 12/16/2023   PAF (paroxysmal atrial fibrillation) (HCC)    a.) CHA2DS2-VASc = 5 (age, HTN, vascular disease history/MI, T2DM) as of 06/10/2023; b.) cardiac rate/rhythm maintained intrinsically without pharmacological intervention; chronically anticoagulated using apixaban    Pneumonia    x 1   Sepsis due to gram-negative UTI (HCC) 04/27/2020   Sleep apnea    uses CPAP nightly  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   From DM and HTN, f/u with [facility] every 6 months   Stroke (HCC) 06/02/2024   Subconjunctival hemorrhage of right eye 12/15/2023   T2DM (type 2 diabetes mellitus) (HCC)    Unstable angina (HCC)    Vitamin D deficiency     Past Surgical History:  Procedure Laterality Date   CATARACT EXTRACTION Bilateral    CATARACT EXTRACTION W/PHACO Right 02/03/2017   Procedure: CATARACT EXTRACTION PHACO AND INTRAOCULAR LENS PLACEMENT (IOC);  Surgeon: Mittie Gaskin, MD;  Location: [facility] ORS;  Service: Ophthalmology;  Laterality: Right;  US  00:35.8 AP% 12.8 CDE 4.57 Fluid lot # [id number]   COLONOSCOPY N/A 01/30/2024   Procedure: COLONOSCOPY;  Surgeon: Therisa Bi, MD;  Location: [facility] ENDOSCOPY;  Service: Gastroenterology;  Laterality: N/A;  IDDM   COLONOSCOPY WITH PROPOFOL  N/A 12/30/2021   Procedure: COLONOSCOPY WITH PROPOFOL ;  Surgeon: Therisa Bi, MD;  Location: [facility] ENDOSCOPY;  Service: Gastroenterology;  Laterality: N/A;   CORONARY ANGIOPLASTY WITH STENT PLACEMENT Left 2004   stents x 2    CORONARY ANGIOPLASTY WITH STENT PLACEMENT Left 2005   stent x 1   CORONARY ANGIOPLASTY WITH STENT PLACEMENT Left 08/25/2007   Procedure: CORONARY ANGIOPLASTY WITH STENT PLACEMENT; Location: [facility]; Surgeon: Valinda Cage, MD   CORONARY ARTERY BYPASS GRAFT N/A 06/08/2006   Procedure: CORONARY ARTERY BYPASS GRAFT; Location: [facility]; Surgeon: Maude Sharps, MD   CYSTOSCOPY WITH INSERTION OF UROLIFT N/A 06/13/2023   Procedure: CYSTOSCOPY WITH INSERTION OF UROLIFT;  Surgeon: Penne Knee, MD;  Location: [facility] ORS;  Service: Urology;  Laterality: N/A;   ENDARTERECTOMY Left 09/06/2019   Procedure: ENDARTERECTOMY CAROTID;  Surgeon: Marea Selinda RAMAN, MD;  Location: [facility] ORS;  Service: Vascular;  Laterality: Left;   ESOPHAGOGASTRODUODENOSCOPY N/A 01/30/2024   Procedure: EGD (ESOPHAGOGASTRODUODENOSCOPY);  Surgeon: Therisa Bi, MD;  Location: [facility] ENDOSCOPY;  Service: Gastroenterology;  Laterality: N/A;   HIP FRACTURE SURGERY     KIDNEY TRANSPLANT Right 05/10/2015   LEFT HEART CATH AND CORONARY ANGIOGRAPHY Left 05/20/2006   Procedure: LEFT HEART CATH AND CORONARY ANGIOGRAPHY; Location: [facility]; Surgeon: Denyse Bathe, MD   LEFT HEART CATH AND CORONARY ANGIOGRAPHY Left 11/08/2023   Procedure: LEFT HEART CATH AND CORONARY ANGIOGRAPHY with possible intervention;  Surgeon: Bathe Denyse LABOR, MD;  Location: [facility] INVASIVE CV LAB;  Service: Cardiovascular;  Laterality: Left;   LEFT HEART CATH AND CORS/GRAFTS ANGIOGRAPHY Left 08/10/2007   Procedure: LEFT HEART CATH AND CORS/GRAFTS ANGIOGRAPHY; Location: [facility]; Surgeon: Denyse Bathe, MD   LEFT HEART CATH AND CORS/GRAFTS ANGIOGRAPHY Left 05/04/2011   Procedure: LEFT HEART CATH AND CORS/GRAFTS ANGIOGRAPHY; Location: [facility]; Surgeon: Denyse Bathe, MD   POLYPECTOMY  01/30/2024   Procedure: POLYPECTOMY, INTESTINE;  Surgeon: Therisa Bi, MD;  Location: [facility] ENDOSCOPY;  Service: Gastroenterology;;    MEDICATIONS: No current facility-administered medications for this encounter.     acetaminophen  (TYLENOL ) 500 MG tablet   amiodarone  (PACERONE ) 200 MG tablet   ascorbic acid (VITAMIN C) 500 MG tablet   aspirin  EC 81 MG EC tablet   atorvastatin  (LIPITOR) 40 MG tablet   CELLCEPT  250 MG capsule   Cholecalciferol  (VITAMIN D) 50 MCG (2000 UT) CAPS   ELIQUIS  5 MG TABS tablet   finasteride  (PROSCAR ) 5 MG tablet   HUMALOG KWIKPEN 100 UNIT/ML KiwkPen   hydrALAZINE  (APRESOLINE ) 25 MG tablet   insulin  glargine (LANTUS  SOLOSTAR) 100 UNIT/ML Solostar Pen   linagliptin  (TRADJENTA ) 5 MG TABS tablet   losartan  (COZAAR ) 100 MG tablet   omeprazole (PRILOSEC) 20 MG capsule  PROGRAF  1 MG capsule   tacrolimus  (PROGRAF ) 0.5 MG capsule   tamsulosin  (FLOMAX ) 0.4 MG CAPS capsule   torsemide  (DEMADEX ) 10 MG tablet   albuterol  (VENTOLIN  HFA) 108 (90 Base) MCG/ACT inhaler   Continuous Blood Gluc Sensor (FREESTYLE LIBRE 2 SENSOR) MISC   Continuous Glucose Sensor (FREESTYLE LIBRE 2 PLUS SENSOR) MISC   Insulin  Pen Needle 33G X 4 MM MISC   nitroGLYCERIN  (NITROSTAT ) 0.4 MG SL tablet   Semaglutide, 2 MG/DOSE, (OZEMPIC, 2 MG/DOSE,) 8 MG/3ML SOPN    sodium chloride  flush (NS) 0.9 % injection 3 mL    Isaiah Ruder, PA-C Surgical Short Stay/Anesthesiology [facility] Phone [phone] [facility] Phone [phone] 07/06/2024 5:32 PM

## 2024-07-09 ENCOUNTER — Inpatient Hospital Stay (HOSPITAL_COMMUNITY): Payer: Self-pay | Admitting: Vascular Surgery

## 2024-07-09 ENCOUNTER — Inpatient Hospital Stay (HOSPITAL_COMMUNITY)
Admission: RE | Admit: 2024-07-09 | Discharge: 2024-07-10 | DRG: 091 | Disposition: A | Attending: Neuroradiology | Admitting: Neuroradiology

## 2024-07-09 ENCOUNTER — Encounter (HOSPITAL_COMMUNITY): Admission: RE | Disposition: A | Payer: Self-pay | Source: Home / Self Care | Attending: Neuroradiology

## 2024-07-09 ENCOUNTER — Inpatient Hospital Stay (HOSPITAL_COMMUNITY)
Admission: RE | Admit: 2024-07-09 | Discharge: 2024-07-09 | Disposition: A | Discharge status: Home / Self Care | Source: Ambulatory Visit | Attending: Neuroradiology | Admitting: Neuroradiology

## 2024-07-09 DIAGNOSIS — I671 Cerebral aneurysm, nonruptured: Principal | ICD-10-CM | POA: Diagnosis present

## 2024-07-09 HISTORY — PX: IR ANGIOGRAM FOLLOW UP STUDY: IMG697

## 2024-07-09 HISTORY — PX: IR ANGIO INTRA EXTRACRAN SEL INTERNAL CAROTID UNI R MOD SED: IMG5362

## 2024-07-09 HISTORY — DX: Pneumonia, unspecified organism: J18.9

## 2024-07-09 HISTORY — DX: Sleep apnea, unspecified: G47.30

## 2024-07-09 HISTORY — PX: IR NEURO EACH ADD'L AFTER BASIC UNI RIGHT (MS): IMG5374

## 2024-07-09 HISTORY — PX: RADIOLOGY WITH ANESTHESIA: SHX6223

## 2024-07-09 HISTORY — PX: IR TRANSCATH/EMBOLIZ: IMG695

## 2024-07-09 HISTORY — PX: IR US GUIDE VASC ACCESS RIGHT: IMG2390

## 2024-07-09 LAB — GLUCOSE, CAPILLARY
Glucose-Capillary: 177 mg/dL — ABNORMAL HIGH (ref 70–99)
Glucose-Capillary: 147 mg/dL — ABNORMAL HIGH (ref 70–99)
Glucose-Capillary: 127 mg/dL — ABNORMAL HIGH (ref 70–99)
Glucose-Capillary: 100 mg/dL — ABNORMAL HIGH (ref 70–99)
Glucose-Capillary: 164 mg/dL — ABNORMAL HIGH (ref 70–99)
Glucose-Capillary: 167 mg/dL — ABNORMAL HIGH (ref 70–99)
Glucose-Capillary: 165 mg/dL — ABNORMAL HIGH (ref 70–99)

## 2024-07-09 SURGERY — RADIOLOGY WITH ANESTHESIA
Anesthesia: General

## 2024-07-09 MED ORDER — FENTANYL CITRATE (PF) 100 MCG/2ML IJ SOLN: 25.0000 ug | INTRAMUSCULAR | Status: DC | PRN

## 2024-07-09 MED ORDER — ONDANSETRON HCL 4 MG/2ML IJ SOLN: 4.0000 mg | Freq: Once | INTRAMUSCULAR | Status: DC | PRN

## 2024-07-09 MED ORDER — LIDOCAINE 2% (20 MG/ML) 5 ML SYRINGE
INTRAMUSCULAR | Status: DC | PRN
  Administered 2024-07-09: 100 mg via INTRAVENOUS

## 2024-07-09 MED ORDER — EPHEDRINE SULFATE-NACL 50-0.9 MG/10ML-% IV SOSY
PREFILLED_SYRINGE | INTRAVENOUS | Status: DC | PRN
  Administered 2024-07-09: 5 mg via INTRAVENOUS
  Administered 2024-07-09: 10 mg via INTRAVENOUS

## 2024-07-09 MED ORDER — FINASTERIDE 5 MG PO TABS
5.0000 mg | ORAL_TABLET | Freq: Every day | ORAL | Status: DC
  Administered 2024-07-09: 5 mg via ORAL
  Filled 2024-07-09 (×2): qty 1

## 2024-07-09 MED ORDER — AMIODARONE HCL 200 MG PO TABS
200.0000 mg | ORAL_TABLET | Freq: Every day | ORAL | Status: DC
  Administered 2024-07-09 – 2024-07-10 (×2): 200 mg via ORAL
  Filled 2024-07-09 (×2): qty 1

## 2024-07-09 MED ORDER — CEFAZOLIN SODIUM-DEXTROSE 2-4 GM/100ML-% IV SOLN
2.0000 g | Freq: Once | INTRAVENOUS | Status: AC
  Administered 2024-07-09: 2 g via INTRAVENOUS
  Filled 2024-07-09: qty 100

## 2024-07-09 MED ORDER — CHLORHEXIDINE GLUCONATE 0.12 % MT SOLN: 15.0000 mL | Freq: Once | OROMUCOSAL | Status: AC

## 2024-07-09 MED ORDER — TACROLIMUS 0.5 MG PO CAPS
0.5000 mg | ORAL_CAPSULE | Freq: Every day | ORAL | Status: DC
  Administered 2024-07-09: 0.5 mg via ORAL
  Filled 2024-07-09: qty 1

## 2024-07-09 MED ORDER — VERAPAMIL HCL 2.5 MG/ML IV SOLN
INTRAVENOUS | Status: AC | PRN
  Administered 2024-07-09: 3 mg via INTRA_ARTERIAL

## 2024-07-09 MED ORDER — ACETAMINOPHEN 10 MG/ML IV SOLN: 1000.0000 mg | Freq: Once | INTRAVENOUS | Status: DC | PRN

## 2024-07-09 MED ORDER — HYDRALAZINE HCL 25 MG PO TABS
25.0000 mg | ORAL_TABLET | Freq: Every day | ORAL | Status: DC
  Administered 2024-07-09 – 2024-07-10 (×2): 25 mg via ORAL
  Filled 2024-07-09 (×2): qty 1

## 2024-07-09 MED ORDER — PROPOFOL 10 MG/ML IV BOLUS
INTRAVENOUS | Status: DC | PRN
  Administered 2024-07-09: 20 mg via INTRAVENOUS
  Administered 2024-07-09: 100 mg via INTRAVENOUS

## 2024-07-09 MED ORDER — EPTIFIBATIDE 20 MG/10ML IV SOLN
INTRAVENOUS | Status: AC | PRN
  Administered 2024-07-09: 6 mg via INTRAVENOUS

## 2024-07-09 MED ORDER — INSULIN ASPART 100 UNIT/ML IJ SOLN
0.0000 [IU] | Freq: Three times a day (TID) | INTRAMUSCULAR | Status: DC
  Administered 2024-07-09: 3 [IU] via SUBCUTANEOUS
  Filled 2024-07-09: qty 3

## 2024-07-09 MED ORDER — SUGAMMADEX SODIUM 200 MG/2ML IV SOLN
INTRAVENOUS | Status: DC | PRN
  Administered 2024-07-09: 160 mg via INTRAVENOUS

## 2024-07-09 MED ORDER — LOSARTAN POTASSIUM 50 MG PO TABS
100.0000 mg | ORAL_TABLET | Freq: Every day | ORAL | Status: DC
  Administered 2024-07-09 – 2024-07-10 (×2): 100 mg via ORAL
  Filled 2024-07-09 (×2): qty 2

## 2024-07-09 MED ORDER — FENTANYL CITRATE (PF) 250 MCG/5ML IJ SOLN
INTRAMUSCULAR | Status: DC | PRN
  Administered 2024-07-09 (×2): 50 ug via INTRAVENOUS

## 2024-07-09 MED ORDER — ASPIRIN 81 MG PO CHEW
81.0000 mg | CHEWABLE_TABLET | Freq: Every day | ORAL | Status: DC
  Administered 2024-07-09 – 2024-07-10 (×2): 81 mg via ORAL
  Filled 2024-07-09 (×2): qty 1

## 2024-07-09 MED ORDER — CHLORHEXIDINE GLUCONATE 0.12 % MT SOLN
OROMUCOSAL | Status: AC
  Administered 2024-07-09: 15 mL via OROMUCOSAL
  Filled 2024-07-09: qty 15

## 2024-07-09 MED ORDER — TORSEMIDE 10 MG PO TABS
10.0000 mg | ORAL_TABLET | Freq: Two times a day (BID) | ORAL | Status: DC
  Administered 2024-07-09: 10 mg via ORAL
  Filled 2024-07-09 (×2): qty 1

## 2024-07-09 MED ORDER — FENTANYL CITRATE (PF) 100 MCG/2ML IJ SOLN
INTRAMUSCULAR | Status: AC
  Filled 2024-07-09: qty 2

## 2024-07-09 MED ORDER — ASPIRIN 81 MG PO CHEW
81.0000 mg | CHEWABLE_TABLET | Freq: Every day | ORAL | Status: DC
  Filled 2024-07-09: qty 1

## 2024-07-09 MED ORDER — TACROLIMUS 1 MG PO CAPS
1.0000 mg | ORAL_CAPSULE | ORAL | Status: DC
  Administered 2024-07-09 – 2024-07-10 (×2): 1 mg via ORAL
  Filled 2024-07-09 (×2): qty 1

## 2024-07-09 MED ORDER — ORAL CARE MOUTH RINSE: 15.0000 mL | Freq: Once | OROMUCOSAL | Status: AC

## 2024-07-09 MED ORDER — PHENYLEPHRINE HCL-NACL 20-0.9 MG/250ML-% IV SOLN
INTRAVENOUS | Status: DC | PRN
  Administered 2024-07-09: 50 ug/min via INTRAVENOUS

## 2024-07-09 MED ORDER — ASPIRIN 81 MG PO TBEC: 81.0000 mg | DELAYED_RELEASE_TABLET | Freq: Every day | ORAL | Status: DC

## 2024-07-09 MED ORDER — ACETAMINOPHEN 325 MG PO TABS
650.0000 mg | ORAL_TABLET | ORAL | Status: DC | PRN
  Administered 2024-07-09: 650 mg via ORAL
  Filled 2024-07-09: qty 2

## 2024-07-09 MED ORDER — ACETAMINOPHEN 650 MG RE SUPP: 650.0000 mg | RECTAL | Status: DC | PRN

## 2024-07-09 MED ORDER — CHLORHEXIDINE GLUCONATE CLOTH 2 % EX PADS
6.0000 | MEDICATED_PAD | Freq: Every day | CUTANEOUS | Status: DC
  Administered 2024-07-09: 6 via TOPICAL

## 2024-07-09 MED ORDER — EPTIFIBATIDE 20 MG/10ML IV SOLN
INTRAVENOUS | Status: AC
  Filled 2024-07-09: qty 10

## 2024-07-09 MED ORDER — INSULIN ASPART 100 UNIT/ML IJ SOLN: 0.0000 [IU] | INTRAMUSCULAR | Status: DC | PRN

## 2024-07-09 MED ORDER — LACTATED RINGERS IV SOLN: INTRAVENOUS | Status: DC | PRN

## 2024-07-09 MED ORDER — PANTOPRAZOLE SODIUM 40 MG PO TBEC
40.0000 mg | DELAYED_RELEASE_TABLET | Freq: Every day | ORAL | Status: DC
  Administered 2024-07-09: 40 mg via ORAL
  Filled 2024-07-09 (×2): qty 1

## 2024-07-09 MED ORDER — ROCURONIUM BROMIDE 10 MG/ML (PF) SYRINGE
PREFILLED_SYRINGE | INTRAVENOUS | Status: DC | PRN
  Administered 2024-07-09: 50 mg via INTRAVENOUS

## 2024-07-09 MED ORDER — VERAPAMIL HCL 2.5 MG/ML IV SOLN
INTRAVENOUS | Status: AC
  Filled 2024-07-09: qty 2

## 2024-07-09 MED ORDER — HEPARIN SODIUM (PORCINE) 1000 UNIT/ML IJ SOLN
INTRAMUSCULAR | Status: DC | PRN
  Administered 2024-07-09: 4000 [IU] via INTRAVENOUS

## 2024-07-09 MED ORDER — ACETAMINOPHEN 160 MG/5ML PO SOLN: 650.0000 mg | ORAL | Status: DC | PRN

## 2024-07-09 MED ORDER — PROTAMINE SULFATE 10 MG/ML IV SOLN
INTRAVENOUS | Status: DC | PRN
  Administered 2024-07-09: 20 mg via INTRAVENOUS
  Administered 2024-07-09: 10 mg via INTRAVENOUS

## 2024-07-09 MED ORDER — IOHEXOL 300 MG/ML  SOLN
150.0000 mL | Freq: Once | INTRAMUSCULAR | Status: AC | PRN
  Administered 2024-07-09: 50 mL via INTRA_ARTERIAL

## 2024-07-09 MED ORDER — CLEVIDIPINE BUTYRATE 0.5 MG/ML IV EMUL
0.0000 mg/h | INTRAVENOUS | Status: DC
  Administered 2024-07-09: 2 mg/h via INTRAVENOUS
  Administered 2024-07-10: 6 mg/h via INTRAVENOUS
  Filled 2024-07-09: qty 100

## 2024-07-09 MED ORDER — MYCOPHENOLATE MOFETIL 250 MG PO CAPS
500.0000 mg | ORAL_CAPSULE | Freq: Two times a day (BID) | ORAL | Status: DC
  Administered 2024-07-09 (×2): 500 mg via ORAL
  Filled 2024-07-09 (×3): qty 2

## 2024-07-09 MED ORDER — SODIUM CHLORIDE 0.9 % IV BOLUS: 250.0000 mL | INTRAVENOUS | Status: DC | PRN

## 2024-07-09 MED ORDER — ATORVASTATIN CALCIUM 40 MG PO TABS
40.0000 mg | ORAL_TABLET | Freq: Every day | ORAL | Status: DC
  Administered 2024-07-09: 40 mg via ORAL
  Filled 2024-07-09: qty 1

## 2024-07-09 MED ORDER — ONDANSETRON HCL 4 MG/2ML IJ SOLN
INTRAMUSCULAR | Status: DC | PRN
  Administered 2024-07-09: 4 mg via INTRAVENOUS

## 2024-07-09 MED ORDER — GLYCOPYRROLATE PF 0.2 MG/ML IJ SOSY
PREFILLED_SYRINGE | INTRAMUSCULAR | Status: DC | PRN
  Administered 2024-07-09 (×2): .2 mg via INTRAVENOUS

## 2024-07-09 MED ORDER — TAMSULOSIN HCL 0.4 MG PO CAPS
0.8000 mg | ORAL_CAPSULE | Freq: Every day | ORAL | Status: DC
  Administered 2024-07-09 – 2024-07-10 (×2): 0.8 mg via ORAL
  Filled 2024-07-09 (×2): qty 2

## 2024-07-09 NOTE — Anesthesia Procedure Notes (Signed)
 Procedure Name: Intubation Date/Time: 07/09/2024 10:32 AM  Performed by: Myrna Homer, CRNAPre-anesthesia Checklist: Patient identified, Emergency Drugs available, Suction available and Patient being monitored Patient Re-evaluated:Patient Re-evaluated prior to induction Oxygen Delivery Method: Circle System Utilized Preoxygenation: Pre-oxygenation with 100% oxygen Induction Type: IV induction Ventilation: Mask ventilation without difficulty Laryngoscope Size: Glidescope and 3 Grade View: Grade II Tube type: Oral Tube size: 7.5 mm Number of attempts: 1 Airway Equipment and Method: Stylet and Oral airway Placement Confirmation: ETT inserted through vocal cords under direct vision, positive ETCO2 and breath sounds checked- equal and bilateral Secured at: 23 cm Tube secured with: Tape Dental Injury: Teeth and Oropharynx as per pre-operative assessment

## 2024-07-09 NOTE — Plan of Care (Signed)
  Problem: Education: Goal: Knowledge of General Education information will improve Description: Including pain rating scale, medication(s)/side effects and non-pharmacologic comfort measures Outcome: Progressing   Problem: Health Behavior/Discharge Planning: Goal: Ability to manage health-related needs will improve Outcome: Progressing   Problem: Clinical Measurements: Goal: Ability to maintain clinical measurements within normal limits will improve Outcome: Progressing Goal: Will remain free from infection Outcome: Progressing Goal: Diagnostic test results will improve Outcome: Progressing Goal: Respiratory complications will improve Outcome: Progressing Goal: Cardiovascular complication will be avoided Outcome: Progressing   Problem: Nutrition: Goal: Adequate nutrition will be maintained Outcome: Progressing   Problem: Activity: Goal: Risk for activity intolerance will decrease Outcome: Progressing   Problem: Pain Managment: Goal: General experience of comfort will improve and/or be controlled Outcome: Progressing   Problem: Safety: Goal: Ability to remain free from injury will improve Outcome: Progressing

## 2024-07-09 NOTE — Sedation Documentation (Signed)
 Bedside report given to RN. Femoral site assessed - Level 0, no hematoma, dressing is clean, dry, and intact. Pulses also assessed bilaterally.

## 2024-07-09 NOTE — Anesthesia Procedure Notes (Signed)
 Arterial Line Insertion Start/End12/03/2024 10:38 AM, 07/09/2024 10:45 AM Performed by: Jefm Garnette LABOR, MD, anesthesiologist  Patient location: Pre-op. Preanesthetic checklist: patient identified, IV checked, site marked, risks and benefits discussed, surgical consent, monitors and equipment checked, pre-op evaluation, timeout performed and anesthesia consent Right, radial was placed Catheter size: 5 French Hand hygiene performed , maximum sterile barriers used  and Seldinger technique used  Attempts: 1 Procedure performed using ultrasound to evaluate access site. Ultrasound Notes:relevant anatomy identified, ultrasound used to visualize needle entry and vessel patent under ultrasound. Following insertion, dressing applied. Post procedure assessment: normal and unchanged  Patient tolerated the procedure well with no immediate complications.

## 2024-07-09 NOTE — Transfer of Care (Signed)
 Immediate Anesthesia Transfer of Care Note  Patient: [name]  Procedure(s) Performed: IR TRANSCATH/EMBOLIZ IR US  GUIDE VASC ACCESS RIGHT IR ANGIO INTRA EXTRACRAN SEL INTERNAL CAROTID UNI R MOD SED IR NEURO EACH ADD'L AFTER BASIC UNI RIGHT (MS) IR ANGIOGRAM FOLLOW UP STUDY RADIOLOGY WITH ANESTHESIA  Patient Location: PACU  Anesthesia Type:General  Level of Consciousness: awake, patient cooperative, and responds to stimulation  Airway & Oxygen Therapy: Patient Spontanous Breathing and Patient connected to face mask oxygen  Post-op Assessment: Report given to RN, Post -op Vital signs reviewed and stable, and Patient moving all extremities X 4  Post vital signs: Reviewed and stable  Last Vitals:  Vitals Value Taken Time  BP    Temp    Pulse    Resp    SpO2      Last Pain:  Vitals:   07/09/24 0735  TempSrc: Oral         Complications: No notable events documented.

## 2024-07-09 NOTE — Sedation Documentation (Signed)
 Patient transported to recovery area via stretcher with CRNA.

## 2024-07-09 NOTE — Anesthesia Postprocedure Evaluation (Signed)
 Anesthesia Post Note  Patient: [name]  Procedure(s) Performed: IR TRANSCATH/EMBOLIZ IR US  GUIDE VASC ACCESS RIGHT IR ANGIO INTRA EXTRACRAN SEL INTERNAL CAROTID UNI R MOD SED IR NEURO EACH ADD'L AFTER BASIC UNI RIGHT (MS) IR ANGIOGRAM FOLLOW UP STUDY RADIOLOGY WITH ANESTHESIA     Patient location during evaluation: PACU Anesthesia Type: General Level of consciousness: awake and alert Pain management: pain level controlled Vital Signs Assessment: post-procedure vital signs reviewed and stable Respiratory status: spontaneous breathing, nonlabored ventilation, respiratory function stable and patient connected to nasal cannula oxygen Cardiovascular status: blood pressure returned to baseline and stable Postop Assessment: no apparent nausea or vomiting Anesthetic complications: no   No notable events documented.  Last Vitals:  Vitals:   07/09/24 1230 07/09/24 1245  BP: (!) 137/47 (!) 141/48  Pulse: (!) 46 (!) 46  Resp: 15 10  Temp:  36.7 C  SpO2: 92% 96%    Last Pain:  Vitals:   07/09/24 1230  PainSc: 0-No pain                 Garnette DELENA Gab

## 2024-07-09 NOTE — Brief Op Note (Signed)
  NEUROSURGERY BRIEF OP NOTE   PREOP DX: right mca aneurysm  POSTOP DX: Same  PROCEDURE: coil   SURGEON: Nancyann LULLA Burns   ANESTHESIA: GETA  EBL: less than 50  COMPLICATIONS: No immediate  CONDITION: Stable  FINDINGS:  Right mca aneurysm, coil  Nancyann LULLA Burns  @today @ 11:46 AM

## 2024-07-09 NOTE — Sedation Documentation (Signed)
 Patient moved to table by staff , secured, hooked to monitors, and now under the care of anesthesia. Please see charting in vitals per CRNA.

## 2024-07-09 NOTE — Consult Note (Signed)
 NAME:  [name], MRN:  [id number], DOB:  25-Feb-1956, LOS: 0 ADMISSION DATE:  07/09/2024, CONSULTATION DATE:  07/09/24 REFERRING MD:  Lester CHIEF COMPLAINT:  R MCA aneurysm   History of Present Illness:  [name] is a 68 y.o. male who has a PMH as outlined below including but not limited to right MCA aneurysm.  He was seen by neurosurgery (Dr. Lester) and on 12/3, he decided that he wanted to proceed with treatment of his right MCA aneurysm.  On 12/8, he was admitted to [facility] for elective coiling of above aneurysm.  Postoperatively, he was transferred to the neuro ICU where PCCM was asked to assist with medical management.  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, f/u with [facility] transplant team q6 monthly; Essential (primary) hypertension; Complication of arteriovenous dialysis fistula; Aftercare following organ transplant; Coronary artery disease; Hypercholesteremia; Immunosuppression; Paroxysmal atrial fibrillation (HCC); Pulmonary scarring; Carotid artery stenosis; Carotid stenosis, left; Type 2 diabetes mellitus with stage 3a chronic kidney disease, with long-term current use of insulin  (HCC); OSA (obstructive sleep apnea); Gait difficulty; GERD (gastroesophageal reflux disease); BPH with LUTS s/p UroLift with Dr. Penne in November 2024 on finasteride , Flomax , and Sanctura   and [facility]; Heart failure with mildly reduced ejection fraction (HFmrEF) (HCC); Aortic atherosclerosis; Osteoarthritis; Diabetic polyneuropathy associated with type 2 diabetes mellitus (HCC); Hyperkalemia; Stage 3a chronic kidney disease (HCC); Pulmonary vascular congestion; Lower respiratory infection; Monocular vision loss; Amaurosis fugax of right eye; Aneurysm of artery; and Brain aneurysm on their problem list.  Significant Hospital Events: Including procedures, antibiotic start and stop dates in addition to other pertinent events   12/8 to OR for coiling of R  MCA aneurysm.  Interim History / Subjective:  Stable. No complaints.  Objective:  Blood pressure (!) 141/48, pulse (!) 46, temperature 98 F (36.7 C), resp. rate 10, SpO2 96%.        Intake/Output Summary (Last 24 hours) at 07/09/2024 1336 Last data filed at 07/09/2024 1153 Gross per 24 hour  Intake 400 ml  Output 40 ml  Net 360 ml   There were no vitals filed for this visit.   Physical Exam: General: Adult male, resting in bed, in NAD. Neuro: A&O x 3, no deficits. HEENT: [place]/AT. Sclerae anicteric. EOMI. Cardiovascular: RRR, no M/R/G.  Lungs: Respirations even and unlabored.  CTA bilaterally, No W/R/R. Abdomen: BS x 4, soft, NT/ND.  Musculoskeletal: No gross deformities, no edema. R groin access site stable, no bleeding or hematoma noted.   Assessment & Plan:   R MCA aneurysm - s/p coiling 07/09/24 (Dr. Lester). Hx stroke. - Surgery following/managing. - Continue Cleviprex  for now, goal SBP < 160 per NSGY.  Hx CHF (echo from November 2025 with G2DD)., CAD, HTN, PAF on apixaban . - Continue PTA amiodarone , atorvastatin , losartan , torsemide . - Continue PTA apixaban  once cleared by neurosurgery.  Hx right kidney transplant (follows with [facility] every 6 months) - Continue PTA mycophenolate , tacrolimus .  Hx OSA on CPAP. - Continue nocturnal CPAP therapy.  Hx GERD. - PPI.  Hx DM 2. - SSI. - Hold PTA Tradjenta , Semaglutide, Lantus .  Hx BPH with LUTS - s/p UroLift (Dr. Penne 2024). - Continue PTA Finasteride , Flomax , Sanctura .  Labs   CBC: No results for input(s): WBC, NEUTROABS, HGB, HCT, MCV, PLT in the last 168 hours.  Basic Metabolic Panel: No results for input(s): NA, K, CL, CO2, GLUCOSE, BUN, CREATININE, CALCIUM , MG, PHOS in the last 168 hours. GFR: CrCl  cannot be calculated (Patient's most recent lab result is older than the maximum 21 days allowed.). No results for input(s): PROCALCITON, WBC, LATICACIDVEN in the last 168  hours.  Liver Function Tests: No results for input(s): AST, ALT, ALKPHOS, BILITOT, PROT, ALBUMIN in the last 168 hours. No results for input(s): LIPASE, AMYLASE in the last 168 hours. No results for input(s): AMMONIA in the last 168 hours.  ABG    Component Value Date/Time   PHART 7.403 10/28/2010 0650   PCO2ART 42.4 10/28/2010 0650   PO2ART 54.4 (L) 10/28/2010 0650   HCO3 25.9 (H) 10/28/2010 0650   TCO2 26 02/14/2011 1517   O2SAT 87.8 10/28/2010 0650     Coagulation Profile: No results for input(s): INR, PROTIME in the last 168 hours.  Cardiac Enzymes: No results for input(s): CKTOTAL, CKMB, CKMBINDEX, TROPONINI in the last 168 hours.  HbA1C: Hemoglobin A1C  Date/Time Value Ref Range Status  06/09/2023 12:00 AM 7  Final   Hgb A1c MFr Bld  Date/Time Value Ref Range Status  06/03/2024 11:29 AM 6.7 (H) 4.8 - 5.6 % Final    Comment:    (NOTE) Diagnosis of Diabetes The following HbA1c ranges recommended by the American Diabetes Association (ADA) may be used as an aid in the diagnosis of diabetes mellitus.  Hemoglobin             Suggested A1C NGSP%              Diagnosis  <5.7                   Non Diabetic  5.7-6.4                Pre-Diabetic  >6.4                   Diabetic  <7.0                   Glycemic control for                       adults with diabetes.    12/16/2023 09:08 PM 6.6 (H) 4.8 - 5.6 % Final    Comment:    (NOTE) Pre diabetes:          5.7%-6.4%  Diabetes:              >6.4%  Glycemic control for   <7.0% adults with diabetes     CBG: Recent Labs  Lab 07/09/24 0740 07/09/24 0939 07/09/24 1208  GLUCAP 177* 147* 127*    Review of Systems:   All negative; except for those that are bolded, which indicate positives.  Constitutional: weight loss, weight gain, night sweats, fevers, chills, fatigue, weakness.  HEENT: headaches, sore throat, sneezing, nasal congestion, post nasal drip, difficulty  swallowing, tooth/dental problems, visual complaints, visual changes, ear aches. Neuro: difficulty with speech, weakness, numbness, ataxia. CV:  chest pain, orthopnea, PND, swelling in lower extremities, dizziness, palpitations, syncope.  Resp: cough, hemoptysis, dyspnea, wheezing. GI: heartburn, indigestion, abdominal pain, nausea, vomiting, diarrhea, constipation, change in bowel habits, loss of appetite, hematemesis, melena, hematochezia.  GU: dysuria, change in color of urine, urgency or frequency, flank pain, hematuria. MSK: joint pain or swelling, decreased range of motion. Psych: change in mood or affect, depression, anxiety, suicidal ideations, homicidal ideations. Skin: rash, itching, bruising.   Past Medical History:  He,  has a past medical history of Acute exacerbation of CHF (congestive heart failure) (HCC) (  12/16/2023), Acute on chronic congestive heart failure (HCC) (11/03/2023), Adenomatous polyp of colon (01/30/2024), AKI (acute kidney injury) (12/16/2023), Aortic atherosclerosis, Arthritis, BPH with LUTS s/p UroLift with Dr. Penne in November 2024 on finasteride , Flomax , and Sanctura   and [facility], BPH with LUTS, s/p urolift with Dr. Penne 06/2023, on Finasteride , Flomax , Sancture, sees, Carotid arterial disease, Chest pain (09/20/2017), Colon cancer screening (01/30/2024), Coronary artery disease, DDD (degenerative disc disease), lumbar, Dyspepsia (01/30/2024), Dyspnea, ESRD (end stage renal disease) (HCC), GERD (gastroesophageal reflux disease), History of blood transfusion, Hospitalization within last 30 days (06/03/2020), Hypertension, Long term current use of aspirin , Long term current use of immunosuppressive drug, Myocardial infarction (HCC) (2004), Myocardial infarction (HCC) (2005), On apixaban  therapy, Orthopnea (12/16/2023), PAF (paroxysmal atrial fibrillation) (HCC), Pneumonia, Sepsis due to gram-negative UTI (HCC) (04/27/2020), Sleep apnea, Status post RIGHT  kidney transplant (05/10/2015), Stroke (HCC) (06/02/2024), Subconjunctival hemorrhage of right eye (12/15/2023), T2DM (type 2 diabetes mellitus) (HCC), Unstable angina (HCC), and Vitamin D deficiency.   Surgical History:   Past Surgical History:  Procedure Laterality Date   CATARACT EXTRACTION Bilateral    CATARACT EXTRACTION W/PHACO Right 02/03/2017   Procedure: CATARACT EXTRACTION PHACO AND INTRAOCULAR LENS PLACEMENT (IOC);  Surgeon: Mittie Gaskin, MD;  Location: [facility] ORS;  Service: Ophthalmology;  Laterality: Right;  US  00:35.8 AP% 12.8 CDE 4.57 Fluid lot # [id number]   COLONOSCOPY N/A 01/30/2024   Procedure: COLONOSCOPY;  Surgeon: Therisa Bi, MD;  Location: [facility] ENDOSCOPY;  Service: Gastroenterology;  Laterality: N/A;  IDDM   COLONOSCOPY WITH PROPOFOL  N/A 12/30/2021   Procedure: COLONOSCOPY WITH PROPOFOL ;  Surgeon: Therisa Bi, MD;  Location: [facility] ENDOSCOPY;  Service: Gastroenterology;  Laterality: N/A;   CORONARY ANGIOPLASTY WITH STENT PLACEMENT Left 2004   stents x 2   CORONARY ANGIOPLASTY WITH STENT PLACEMENT Left 2005   stent x 1   CORONARY ANGIOPLASTY WITH STENT PLACEMENT Left 08/25/2007   Procedure: CORONARY ANGIOPLASTY WITH STENT PLACEMENT; Location: [facility]; Surgeon: Valinda Cage, MD   CORONARY ARTERY BYPASS GRAFT N/A 06/08/2006   Procedure: CORONARY ARTERY BYPASS GRAFT; Location: [facility]; Surgeon: Maude Sharps, MD   CYSTOSCOPY WITH INSERTION OF UROLIFT N/A 06/13/2023   Procedure: CYSTOSCOPY WITH INSERTION OF UROLIFT;  Surgeon: Penne Knee, MD;  Location: [facility] ORS;  Service: Urology;  Laterality: N/A;   ENDARTERECTOMY Left 09/06/2019   Procedure: ENDARTERECTOMY CAROTID;  Surgeon: Marea Selinda RAMAN, MD;  Location: [facility] ORS;  Service: Vascular;  Laterality: Left;   ESOPHAGOGASTRODUODENOSCOPY N/A 01/30/2024   Procedure: EGD (ESOPHAGOGASTRODUODENOSCOPY);  Surgeon: Therisa Bi, MD;  Location: [facility] ENDOSCOPY;  Service: Gastroenterology;  Laterality: N/A;   HIP FRACTURE SURGERY      KIDNEY TRANSPLANT Right 05/10/2015   LEFT HEART CATH AND CORONARY ANGIOGRAPHY Left 05/20/2006   Procedure: LEFT HEART CATH AND CORONARY ANGIOGRAPHY; Location: [facility]; Surgeon: Denyse Bathe, MD   LEFT HEART CATH AND CORONARY ANGIOGRAPHY Left 11/08/2023   Procedure: LEFT HEART CATH AND CORONARY ANGIOGRAPHY with possible intervention;  Surgeon: Bathe Denyse LABOR, MD;  Location: [facility] INVASIVE CV LAB;  Service: Cardiovascular;  Laterality: Left;   LEFT HEART CATH AND CORS/GRAFTS ANGIOGRAPHY Left 08/10/2007   Procedure: LEFT HEART CATH AND CORS/GRAFTS ANGIOGRAPHY; Location: [facility]; Surgeon: Denyse Bathe, MD   LEFT HEART CATH AND CORS/GRAFTS ANGIOGRAPHY Left 05/04/2011   Procedure: LEFT HEART CATH AND CORS/GRAFTS ANGIOGRAPHY; Location: [facility]; Surgeon: Denyse Bathe, MD   POLYPECTOMY  01/30/2024   Procedure: POLYPECTOMY, INTESTINE;  Surgeon: Therisa Bi, MD;  Location: [facility] ENDOSCOPY;  Service: Gastroenterology;;     Social History:  reports that he has quit smoking. He has never used smokeless tobacco. He reports that he does not drink alcohol and does not use drugs.   Family History:  His family history includes Drug abuse in his brother and sister; Osteoporosis in his mother.   Allergies Allergies  Allergen Reactions   Oxybutynin  Other (See Comments)    Incomplete bladder emptying   Trospium  Chloride Other (See Comments)    Urinary Retention     Home Medications  Prior to Admission medications   Medication Sig Start Date End Date Taking? Authorizing Provider  acetaminophen  (TYLENOL ) 500 MG tablet Take 500 mg by mouth every 6 (six) hours as needed for mild pain (pain score 1-3) or moderate pain (pain score 4-6).   Yes [provider]  amiodarone  (PACERONE ) 200 MG tablet TAKE 1 TABLET BY MOUTH EVERY DAY 07/05/24  Yes Fernand Denyse LABOR, MD  ascorbic acid (VITAMIN C) 500 MG tablet Take 500 mg by mouth daily.   Yes [provider]  aspirin  EC 81 MG EC tablet Take 1 tablet (81 mg total)  by mouth daily at 6 (six) AM. 09/08/19  Yes Stegmayer, Suzen LABOR, PA-C  atorvastatin  (LIPITOR) 40 MG tablet Take 40 mg by mouth at bedtime. 12/23/14  Yes [provider]  CELLCEPT  250 MG capsule Take 500 mg by mouth 2 (two) times daily. 08/12/21  Yes [provider]  Cholecalciferol  (VITAMIN D) 50 MCG (2000 UT) CAPS Take 2,000 Units by mouth daily.   Yes [provider]  ELIQUIS  5 MG TABS tablet Take 5 mg by mouth 2 (two) times daily.  04/21/18  Yes [provider]  finasteride  (PROSCAR ) 5 MG tablet Take 1 tablet (5 mg total) by mouth daily. 01/24/24  Yes McGowan, Shannon A, PA-C  HUMALOG KWIKPEN 100 UNIT/ML KiwkPen Inject 5-10 Units into the skin 3 (three) times daily. Sliding scale   Yes [provider]  hydrALAZINE  (APRESOLINE ) 25 MG tablet Take 1 tablet (25 mg total) by mouth 2 (two) times daily. Patient taking differently: Take 25 mg by mouth daily. 01/23/24 01/22/25 Yes Fernand Denyse LABOR, MD  insulin  glargine (LANTUS  SOLOSTAR) 100 UNIT/ML Solostar Pen Inject 30 Units into the skin at bedtime. 12/22/23  Yes [provider]  linagliptin  (TRADJENTA ) 5 MG TABS tablet Take 1 tablet (5 mg total) by mouth daily. 02/21/24  Yes Bair, Kalpana, MD  losartan  (COZAAR ) 100 MG tablet Take 1 tablet (100 mg total) by mouth daily. 11/11/23 11/10/24 Yes Fernand Denyse LABOR, MD  omeprazole (PRILOSEC) 20 MG capsule Take 20 mg by mouth daily as needed (heartburn).   Yes [provider]  PROGRAF  1 MG capsule Take 1 mg by mouth every morning. 08/11/16  Yes [provider]  tacrolimus  (PROGRAF ) 0.5 MG capsule Take 0.5 mg by mouth at bedtime. 09/08/23  Yes [provider]  tamsulosin  (FLOMAX ) 0.4 MG CAPS capsule Take 2 capsules (0.8 mg total) by mouth daily. 03/06/24  Yes McGowan, Clotilda A, PA-C  torsemide  (DEMADEX ) 10 MG tablet Take 1 tablet (10 mg total) by mouth 2 (two) times daily. 05/11/24 05/11/25 Yes Fernand Denyse LABOR, MD  albuterol  (VENTOLIN  HFA) [phone]  Base) MCG/ACT inhaler Inhale 2 puffs into the lungs. Patient not taking: Reported on 07/05/2024 05/04/24   [provider]  Continuous Blood Gluc Sensor (FREESTYLE LIBRE 2 SENSOR) MISC by Does not apply route.    [provider]  Continuous Glucose Sensor (FREESTYLE LIBRE 2 PLUS SENSOR) MISC 1 each by Other route  every 14 (fourteen) days. 12/22/23   [provider]  Insulin  Pen Needle 33G X 4 MM MISC To use with insulin  pen 4 times per day. E 13.9 02/19/16   [provider]  nitroGLYCERIN  (NITROSTAT ) 0.4 MG SL tablet Place 1 tablet (0.4 mg total) under the tongue every 5 (five) minutes as needed for chest pain. 11/11/23 11/10/24  Fernand Denyse LABOR, MD  Semaglutide, 2 MG/DOSE, (OZEMPIC, 2 MG/DOSE,) 8 MG/3ML SOPN Inject 0.25 mg weekly. After 4 weeks, adjust to 0.5 mg weekly. Patient not taking: Reported on 07/05/2024 06/25/24   [provider]     Sammi Gore, PA - [facility] For pager details, please see AMION or use Epic chat  After 1900, please call [facility] for cross coverage needs 07/09/2024, 1:36 PM

## 2024-07-09 NOTE — H&P (Signed)
 07/04/24 We met again in the office today.  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   I again reviewed the risks of the procedure, especially the risk of stroke and the small risk of rupture of the aneurysm which can be fatal.   We talked about the natural history of unruptured aneurysms, and the fact that some aneurysms have a very low risk of bleeding.  In his particular case, the aneurysm is a little irregular and may be at higher risk.   He feels well today. Lungs are clear. Heart sounds are normal.  His pulse is regular.   Neurologic exam is normal and unchanged.  Chief Complaint: Patient was seen in consultation today for      Chief Complaint  Patient presents with   New Patient (Initial Visit)      Pt has a aneurysm that was found after a weekend in the hospital. Pt has no new complaints     at the request of Bair,Kalpana   Referring Physician(s): Phineas, Mcenroe is a 68 y.o. 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  I have explained that aneurysms may bleed (rupture), and if this happens it can be fatal or disabling.  I have also explained that some aneurysms have a very low statistical risk of bleeding, in which case the risk of surgery may be higher than the risk of no surgery.  In this particular case I estimated the risk of rupture of the aneurysm around 1 %/year and the risk of surgery around 3% for significant complication.   - Discussed surgical intervention with family and patient. - Consider surgical intervention based on patient's decision. - Schedule follow-up imaging in one year if no intervention.   I will follow-up with him and his daughter on the telephone in about 10 days.    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  No cervical carotid stenosis.         Discussed the use of AI scribe software for clinical note transcription with the patient, who gave verbal consent to proceed.   History of Present Illness [name] is a 68 year old male with a history of open-heart surgery and dialysis who presented with vision loss in the right eye.   He experienced sudden vision loss in his right eye while driving . Initially, he could not see out of his right eye and confirmed the issue by covering his left eye in front of a mirror. 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 Currently, he has very limited vision in the right eye.   He has a history of three heart attacks and underwent open-heart surgery in 2008. No current chest pain, breathing problems, or weakness in his arms or legs. No episodes of speech difficulties or additional strokes.    He denies smoking or alcohol use.   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  I reviewed the CT arteriogram and CT scan from 06/01/2024 as well as the brain MRI from 06/02/2024 and his carotid ultrasound from 06/05/2024.           Past Medical History:  Diagnosis Date   Acute exacerbation of CHF (congestive  heart failure) (HCC) 12/16/2023   Acute on chronic congestive heart failure (HCC) 11/03/2023   Adenomatous polyp of colon 01/30/2024   AKI (acute kidney injury) 12/16/2023   Aortic atherosclerosis     Arthritis     BPH with LUTS s/p UroLift with Dr. Penne in November 2024 on finasteride , Flomax , and Sanctura   and sees [facility]     BPH with LUTS, s/p urolift with Dr. Penne 06/2023, on Finasteride , Flomax , Sancture, sees     Carotid arterial disease      a.) s/p LEFT CEA on 09/06/2019   Chest pain 09/20/2017   Colon cancer screening 01/30/2024   Coronary artery disease      a.) MI with stents x 2 in 2004; b.)  MI with stent x 1 2005; c.) s/p CABG x 2 06/02/2006 (LIMA-LAD, SVG-PDA); d.) LHC/PCI 08/25/2007: 80% oD1 (2.5 x 12 mm Xience V DES)   DDD (degenerative disc disease), lumbar     Dyspepsia 01/30/2024   Dyspnea     ESRD (end stage renal disease) (HCC)      a.) s/p RIGHT renal 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/2015   GERD (gastroesophageal reflux disease)     History of blood transfusion     Hospitalization within last 30 days 06/03/2020   Hypertension     Long term current use of aspirin      Long term current use of immunosuppressive drug      a.) mycophenolate  + tacrolimus    Myocardial infarction [facility]) 2004    a.) details unclear; stents x 2 (unknown type/location)   Myocardial infarction (HCC) 2005    a.) details unclear; stent x 1 (unknown type/location)   On apixaban  therapy     Orthopnea 12/16/2023   PAF (paroxysmal atrial fibrillation) (HCC)      a.) CHA2DS2-VASc = 5 (age, HTN, vascular disease history/MI, T2DM) as of 06/10/2023; b.) cardiac rate/rhythm maintained intrinsically without pharmacological intervention; chronically anticoagulated using apixaban    Sepsis due to gram-negative UTI (HCC) 04/27/2020   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    From DM and HTN, f/u with [facility] every 6 months   Subconjunctival hemorrhage of right eye 12/15/2023   T2DM (type 2 diabetes mellitus) (HCC)     Unstable angina (HCC)     Vitamin D deficiency                 Past Surgical History:  Procedure Laterality Date   CATARACT EXTRACTION Bilateral     CATARACT EXTRACTION W/PHACO Right 02/03/2017    Procedure: CATARACT EXTRACTION PHACO AND INTRAOCULAR LENS PLACEMENT (IOC);  Surgeon: Mittie Gaskin, MD;  Location: [facility] ORS;  Service: Ophthalmology;  Laterality: Right;  US  00:35.8 AP% 12.8 CDE 4.57 Fluid lot # [id number]   COLONOSCOPY N/A 01/30/2024    Procedure: COLONOSCOPY;  Surgeon: Therisa Bi, MD;  Location: [facility] ENDOSCOPY;  Service: Gastroenterology;  Laterality: N/A;  IDDM   COLONOSCOPY WITH  PROPOFOL  N/A 12/30/2021    Procedure: COLONOSCOPY WITH PROPOFOL ;  Surgeon: Therisa Bi, MD;  Location: [facility] ENDOSCOPY;  Service: Gastroenterology;  Laterality: N/A;   CORONARY ANGIOPLASTY WITH STENT PLACEMENT Left 2004    stents x 2   CORONARY ANGIOPLASTY WITH STENT PLACEMENT Left 2005    stent x 1   CORONARY ANGIOPLASTY WITH STENT PLACEMENT Left 08/25/2007    Procedure: CORONARY ANGIOPLASTY WITH STENT PLACEMENT; Location: [facility]; Surgeon: Valinda Cage, MD   CORONARY ARTERY BYPASS GRAFT N/A 06/08/2006    Procedure: CORONARY ARTERY BYPASS GRAFT; Location: [facility]; Surgeon: Maude Sharps, MD  CYSTOSCOPY WITH INSERTION OF UROLIFT N/A 06/13/2023    Procedure: CYSTOSCOPY WITH INSERTION OF UROLIFT;  Surgeon: Penne Knee, MD;  Location: [facility] ORS;  Service: Urology;  Laterality: N/A;   ENDARTERECTOMY Left 09/06/2019    Procedure: ENDARTERECTOMY CAROTID;  Surgeon: Marea Selinda RAMAN, MD;  Location: [facility] ORS;  Service: Vascular;  Laterality: Left;   ESOPHAGOGASTRODUODENOSCOPY N/A 01/30/2024    Procedure: EGD (ESOPHAGOGASTRODUODENOSCOPY);  Surgeon: Therisa Bi, MD;  Location: [facility] ENDOSCOPY;  Service: Gastroenterology;  Laterality: N/A;   HIP FRACTURE SURGERY       KIDNEY TRANSPLANT Right 05/10/2015   LEFT HEART CATH AND CORONARY ANGIOGRAPHY Left 05/20/2006    Procedure: LEFT HEART CATH AND CORONARY ANGIOGRAPHY; Location: [facility]; Surgeon: Denyse Bathe, MD   LEFT HEART CATH AND CORONARY ANGIOGRAPHY Left 11/08/2023    Procedure: LEFT HEART CATH AND CORONARY ANGIOGRAPHY with possible intervention;  Surgeon: Bathe Denyse LABOR, MD;  Location: [facility] INVASIVE CV LAB;  Service: Cardiovascular;  Laterality: Left;   LEFT HEART CATH AND CORS/GRAFTS ANGIOGRAPHY Left 08/10/2007    Procedure: LEFT HEART CATH AND CORS/GRAFTS ANGIOGRAPHY; Location: [facility]; Surgeon: Denyse Bathe, MD   LEFT HEART CATH AND CORS/GRAFTS ANGIOGRAPHY Left 05/04/2011    Procedure: LEFT HEART CATH AND CORS/GRAFTS ANGIOGRAPHY; Location: [facility]; Surgeon: Denyse Bathe, MD   POLYPECTOMY   01/30/2024    Procedure: POLYPECTOMY, INTESTINE;  Surgeon: Therisa Bi, MD;  Location: [facility] ENDOSCOPY;  Service: Gastroenterology;;          Allergies: Oxybutynin  and Trospium  chloride   Medications:        Prior to Admission medications   Medication Sig Start Date End Date Taking? Authorizing Provider  amiodarone  (PACERONE ) 200 MG tablet Take 1 tablet (200 mg total) by mouth daily. 01/05/24   Yes Bathe Denyse LABOR, MD  ascorbic acid (VITAMIN C) 500 MG tablet Take 500 mg by mouth daily.     Yes [provider]  aspirin  EC 81 MG EC tablet Take 1 tablet (81 mg total) by mouth daily at 6 (six) AM. 09/08/19   Yes Stegmayer, Suzen LABOR, PA-C  atorvastatin  (LIPITOR) 40 MG tablet Take 40 mg by mouth at bedtime. 12/23/14   Yes [provider]  CELLCEPT  250 MG capsule Take 500 mg by mouth 2 (two) times daily. 08/12/21   Yes [provider]  Cholecalciferol  (VITAMIN D) 50 MCG (2000 UT) CAPS Take 2,000 Units by mouth daily.     Yes [provider]  Continuous Blood Gluc Sensor (FREESTYLE LIBRE 2 SENSOR) MISC by Does not apply route.     Yes [provider]  Continuous Glucose Sensor (FREESTYLE LIBRE 2 PLUS SENSOR) MISC 1 each by Other route every 14 (fourteen) days. 12/22/23   Yes [provider]  ELIQUIS  5 MG TABS tablet Take 5 mg by mouth 2 (two) times daily.  04/21/18   Yes [provider]  finasteride  (PROSCAR ) 5 MG tablet Take 1 tablet (5 mg total) by mouth daily. 01/24/24   Yes McGowan, Clotilda A, PA-C  guaiFENesin -codeine  100-10 MG/5ML syrup Take 5 mLs by mouth every 6 (six) hours as needed. 05/04/24   Yes Paduchowski, Franky, MD  HUMALOG KWIKPEN 100 UNIT/ML KiwkPen Inject 5-10 Units into the skin 3 (three) times daily. Inject 10 units daily at breakfast, 10 units at lunch and up to 10 units or less at supper per SSI     Yes [provider]  hydrALAZINE  (APRESOLINE ) 25 MG tablet Take 1 tablet (25 mg total) by  mouth 2 (two) times  daily. 01/23/24 01/22/25 Yes Fernand Denyse LABOR, MD  insulin  glargine (LANTUS  SOLOSTAR) 100 UNIT/ML Solostar Pen Inject 30 Units into the skin at bedtime. 12/22/23   Yes [provider]  Insulin  Pen Needle 33G X 4 MM MISC To use with insulin  pen 4 times per day. E 13.9 02/19/16   Yes [provider]  linagliptin  (TRADJENTA ) 5 MG TABS tablet Take 1 tablet (5 mg total) by mouth daily. 02/21/24   Yes Bair, Kalpana, MD  losartan  (COZAAR ) 100 MG tablet Take 1 tablet (100 mg total) by mouth daily. 11/11/23 11/10/24 Yes Fernand Denyse LABOR, MD  nitroGLYCERIN  (NITROSTAT ) 0.4 MG SL tablet Place 1 tablet (0.4 mg total) under the tongue every 5 (five) minutes as needed for chest pain. 11/11/23 11/10/24 Yes Fernand Denyse LABOR, MD  omeprazole (PRILOSEC) 20 MG capsule Take 20 mg by mouth daily.     Yes [provider]  PROGRAF  1 MG capsule Take 1 mg by mouth every morning. 08/11/16   Yes [provider]  tacrolimus  (PROGRAF ) 0.5 MG capsule Take 0.5 mg by mouth at bedtime. 09/08/23   Yes [provider]  tamsulosin  (FLOMAX ) 0.4 MG CAPS capsule Take 2 capsules (0.8 mg total) by mouth daily. 03/06/24   Yes McGowan, Clotilda A, PA-C  torsemide  (DEMADEX ) 10 MG tablet Take 1 tablet (10 mg total) by mouth 2 (two) times daily. 05/11/24 05/11/25 Yes Fernand Denyse LABOR, MD  albuterol  (VENTOLIN  HFA) [phone] Base) MCG/ACT inhaler Inhale 2 puffs into the lungs every 6 (six) hours as needed for wheezing or shortness of breath. Patient not taking: Reported on 06/07/2024 05/04/24     Dorothyann Drivers, MD           Family History  Problem Relation Age of Onset   Osteoporosis Mother     Drug abuse Sister     Drug abuse Brother            Social History         Socioeconomic History   Marital status: Married      Spouse name: Not on file   Number of children: Not on file   Years of education: Not on file   Highest education level: 5th grade  Occupational History   Not on file   Tobacco Use   Smoking status: Former   Smokeless tobacco: Never   Tobacco comments:      30 years ago  Vaping Use   Vaping status: Never Used  Substance and Sexual Activity   Alcohol use: No      Alcohol/week: 0.0 standard drinks of alcohol   Drug use: Never   Sexual activity: Not Currently  Other Topics Concern   Not on file  Social History Narrative    married    Social Drivers of Acupuncturist Strain: Low Risk  (02/20/2024)    Overall Financial Resource Strain (CARDIA)     Difficulty of Paying Living Expenses: Not hard at all  Food Insecurity: No Food Insecurity (06/02/2024)    Hunger Vital Sign     Worried About Running Out of Food in the Last Year: Never true     Ran Out of Food in the Last Year: Never true  Transportation Needs: No Transportation Needs (06/02/2024)    PRAPARE - Therapist, Art (Medical): No     Lack of Transportation (Non-Medical): No  Physical Activity: Sufficiently Active (02/20/2024)    Exercise  Vital Sign     Days of Exercise per Week: 5 days     Minutes of Exercise per Session: 60 min  Stress: No Stress Concern Present (02/20/2024)    [employer] of Occupational Health - Occupational Stress Questionnaire     Feeling of Stress: Not at all  Social Connections: Moderately Integrated (06/02/2024)    Social Connection and Isolation Panel     Frequency of Communication with Friends and Family: More than three times a week     Frequency of Social Gatherings with Friends and Family: More than three times a week     Attends Religious Services: More than 4 times per year     Active Member of [employer] or Organizations: No     Attends Banker Meetings: Never     Marital Status: Married      Review of Systems  Respiratory:  Negative for shortness of breath.   Cardiovascular:  Negative for chest pain.     Vital Signs:   BP (!) 166/74   Pulse (!) 50   Ht 5' 8 (1.727 m)   Wt 163 lb (73.9  kg)   SpO2 98%   BMI 24.78 kg/m    Physical Exam CHEST: Clear to auscultation bilaterally. CARDIOVASCULAR: Normal heart sounds. EXTREMITIES: Dialysis fistula in left upper arm. NEUROLOGICAL: Full visual fields in left eye, limited vision in right eye. No sensory deficits or neglect.   Imaging:   Results RADIOLOGY As noted above there is a 7 mm aneurysm arising from the right middle cerebral artery bifurcation.   Labs:   CBC: Recent Labs (within last 365 days)        Recent Labs    05/11/24 1053 05/21/24 1135 06/01/24 1744 06/03/24 1129  WBC 7.6 6.5 7.1 7.1  HGB 13.9 13.7 14.0 13.9  HCT 42.8 42.1 43.3 40.5  PLT 202 177 162 132*        COAGS: Recent Labs (within last 365 days)     Recent Labs    06/01/24 1734  INR 1.5*  APTT 30        BMP: Recent Labs (within last 365 days)         Recent Labs    05/04/24 1113 05/11/24 1053 05/21/24 1135 06/01/24 1744 06/03/24 1129  NA 137 139 135 132* 137  K 4.1 4.6 4.0 4.0 3.8  CL 106 102 100 100 102  CO2 22 21 23 22  20*  GLUCOSE 237* 169* 237* 192* 114*  BUN 34* 32* 71* 41* 39*  CALCIUM  8.2* 9.0 8.8* 8.3* 8.3*  CREATININE 1.74* 1.39* 2.13* 1.86* 1.79*  GFRNONAA 42*  --  33* 39* 41*        LIVER FUNCTION TESTS: Recent Labs (within last 365 days)        Recent Labs    05/11/24 1053 05/21/24 1135 06/01/24 1744 06/03/24 1129  BILITOT 0.7 1.2 1.1 1.0  AST 27 36 30 29  ALT 30 37 30 29  ALKPHOS 54 41 45 43  PROT 6.7 7.3 7.3 7.1  ALBUMIN 4.0 3.9 4.1 3.6        I spent more than 45 minutes with the review of his neuroimaging, review of his history, examination and extensive consultation with the patient and his family.   Electronically Signed: Nancyann LULLA Burns  06/07/2024, 3:45 PM  I have reviewed and confirmed my history and physical from 11//6/25 and 07/04/24 with no additions or changes. Plan for coiling of right mca aneurysm.  Risks and benefits reviewed.  He feels well today Lungs clear Heart  sounds normal Abdomen soft Radial pulses normal              Contains text generated by [employer]      Electronically signed by Lester Golas, MD at 06/07/2024  4:04 PM

## 2024-07-10 ENCOUNTER — Encounter (HOSPITAL_COMMUNITY): Payer: Self-pay | Admitting: Neuroradiology

## 2024-07-10 LAB — BASIC METABOLIC PANEL WITH GFR
Anion gap: 12 (ref 5–15)
BUN: 40 mg/dL — ABNORMAL HIGH (ref 8–23)
CO2: 17 mmol/L — ABNORMAL LOW (ref 22–32)
Calcium: 7.9 mg/dL — ABNORMAL LOW (ref 8.9–10.3)
Chloride: 105 mmol/L (ref 98–111)
Creatinine, Ser: 1.8 mg/dL — ABNORMAL HIGH (ref 0.61–1.24)
GFR, Estimated: 40 mL/min — ABNORMAL LOW (ref >60)
Glucose, Bld: 142 mg/dL — ABNORMAL HIGH (ref 70–99)
Potassium: 4.1 mmol/L (ref 3.5–5.1)
Sodium: 134 mmol/L — ABNORMAL LOW (ref 135–145)

## 2024-07-10 LAB — CBC
HCT: 38.2 % — ABNORMAL LOW (ref 39.0–52.0)
Hemoglobin: 12.8 g/dL — ABNORMAL LOW (ref 13.0–17.0)
MCH: 31 pg (ref 26.0–34.0)
MCHC: 33.5 g/dL (ref 30.0–36.0)
MCV: 92.5 fL (ref 80.0–100.0)
Platelets: 130 K/uL — ABNORMAL LOW (ref 150–400)
RBC: 4.13 MIL/uL — ABNORMAL LOW (ref 4.22–5.81)
RDW: 13 % (ref 11.5–15.5)
WBC: 7.9 K/uL (ref 4.0–10.5)
nRBC: 0 % (ref 0.0–0.2)

## 2024-07-10 LAB — GLUCOSE, CAPILLARY: Glucose-Capillary: 116 mg/dL — ABNORMAL HIGH (ref 70–99)

## 2024-07-10 MED ORDER — AMLODIPINE BESYLATE 5 MG PO TABS: 5.0000 mg | ORAL_TABLET | Freq: Every day | ORAL | 3 refills | Status: AC

## 2024-07-10 MED ORDER — SODIUM BICARBONATE 325 MG PO TABS: 325.0000 mg | ORAL_TABLET | Freq: Two times a day (BID) | ORAL | 1 refills | Status: AC

## 2024-07-10 MED ORDER — AMLODIPINE BESYLATE 5 MG PO TABS
5.0000 mg | ORAL_TABLET | Freq: Every day | ORAL | Status: DC
  Administered 2024-07-10: 5 mg via ORAL
  Filled 2024-07-10: qty 1

## 2024-07-10 MED ORDER — SODIUM BICARBONATE 650 MG PO TABS
650.0000 mg | ORAL_TABLET | Freq: Two times a day (BID) | ORAL | Status: DC
  Administered 2024-07-10: 650 mg via ORAL
  Filled 2024-07-10: qty 1

## 2024-07-10 NOTE — Plan of Care (Signed)
 Pt progressing towards discharge

## 2024-07-10 NOTE — Progress Notes (Signed)
 07/10/2024 Doing well, weaning off cleviprex  Likely home later today Would change the qday hydralazine  which is short acting to qday amlodipine  Also would start on daily bicarb tabs for his chronic metabolic acidosis Discussed with neuro-IR team, will be available PRN  Rolan Sharps MD PCCM

## 2024-07-10 NOTE — Discharge Summary (Signed)
 Physician Discharge Summary   Patient: [name] MRN: [id number] DOB: Apr 14, 1956  Admit date:     07/09/2024  Discharge date: 07/10/24  Discharge Physician: Jayson JINNY Eis   PCP: Abbey Bruckner, MD   Recommendations at discharge:    Discharge Instructions      Rest for 24-48 hours. Avoid strenuous activity for at least 1 week. No heavy lifting >10-15lbs for at least 1 week. You may shower; however, no soaking (baths, pools) for 1 week. Mild bruising of the puncture site is expected.  Continue your home medication with the following alterations:  Start taking sodium bicarbonate  325mg  one time in the morning daily. Resume taking Eliquis  5 mg by mouth 2 (two) times daily.  Stop taking Hydralazine  (Apresline) 25mg  tablet. Instead start taking amLODipine  (NORVASC ) 5 MG tablet Mild headache or fatigue for several days is normal.  Call if you have: severe or worsening headache or new symptoms such as numbness, confusion, slurred speech, or vision changes.  Fever >101 degrees F, worsening pain, of drainage from puncture site.   Swelling, a growing lump, or uncontrolled bleeding at the access site. Chest pain or shortness of breath.  Follow up in: 1 month      Discharge Diagnoses: Principal Problem:   Brain aneurysm  Resolved Problems:   * No resolved hospital problems. [facility] Course: Patient admitted on 07/09/2024  for elective right MCA aneurysm coiling . Operation was uncomplicated. Postop scan and exam showed no complications. Ambulating, pain controlled, tolerating diet, and voiding at time of discharge. Will follow up in [facility] Neurosurgery Team clinic with instructions provided as above. Post-hospitalization questions to be answered by [facility] Neurosurgery team.  Disposition: Home  DISCHARGE MEDICATION: Allergies as of 07/10/2024       Reactions   Oxybutynin  Other (See Comments)   Incomplete bladder emptying   Trospium  Chloride Other (See Comments)    Urinary Retention        Medication List     STOP taking these medications    hydrALAZINE  25 MG tablet Commonly known as: APRESOLINE        TAKE these medications    acetaminophen  500 MG tablet Commonly known as: TYLENOL  Take 500 mg by mouth every 6 (six) hours as needed for mild pain (pain score 1-3) or moderate pain (pain score 4-6).   albuterol  108 (90 Base) MCG/ACT inhaler Commonly known as: VENTOLIN  HFA Inhale 2 puffs into the lungs.   amiodarone  200 MG tablet Commonly known as: PACERONE  TAKE 1 TABLET BY MOUTH EVERY DAY   amLODipine  5 MG tablet Commonly known as: NORVASC  Take 1 tablet (5 mg total) by mouth daily.   ascorbic acid 500 MG tablet Commonly known as: VITAMIN C Take 500 mg by mouth daily.   aspirin  EC 81 MG tablet Take 1 tablet (81 mg total) by mouth daily at 6 (six) AM.   atorvastatin  40 MG tablet Commonly known as: LIPITOR Take 40 mg by mouth at bedtime.   CellCept  250 MG capsule Generic drug: mycophenolate  Take 500 mg by mouth 2 (two) times daily.   Eliquis  5 MG Tabs tablet Generic drug: apixaban  Take 5 mg by mouth 2 (two) times daily.   finasteride  5 MG tablet Commonly known as: PROSCAR  Take 1 tablet (5 mg total) by mouth daily.   FreeStyle Libre 2 Sensor Misc by Does not apply route.   FreeStyle Libre 2 Plus Sensor Misc 1 each by Other route every 14 (fourteen) days.   HumaLOG KwikPen 100  UNIT/ML KwikPen Generic drug: insulin  lispro Inject 5-10 Units into the skin 3 (three) times daily. Sliding scale   Insulin  Pen Needle 33G X 4 MM Misc To use with insulin  pen 4 times per day. E 13.9   Lantus  SoloStar 100 UNIT/ML Solostar Pen Generic drug: insulin  glargine Inject 30 Units into the skin at bedtime.   linagliptin  5 MG Tabs tablet Commonly known as: TRADJENTA  Take 1 tablet (5 mg total) by mouth daily.   losartan  100 MG tablet Commonly known as: Cozaar  Take 1 tablet (100 mg total) by mouth daily.   nitroGLYCERIN  0.4 MG  SL tablet Commonly known as: Nitrostat  Place 1 tablet (0.4 mg total) under the tongue every 5 (five) minutes as needed for chest pain.   omeprazole 20 MG capsule Commonly known as: PRILOSEC Take 20 mg by mouth daily as needed (heartburn).   Ozempic (2 MG/DOSE) 8 MG/3ML Sopn Generic drug: Semaglutide (2 MG/DOSE) Inject 0.25 mg weekly. After 4 weeks, adjust to 0.5 mg weekly.   Prograf  1 MG capsule Generic drug: tacrolimus  Take 1 mg by mouth every morning.   tacrolimus  0.5 MG capsule Commonly known as: PROGRAF  Take 0.5 mg by mouth at bedtime.   sodium bicarbonate  325 MG tablet Take 1 tablet (325 mg total) by mouth 2 (two) times daily.   tamsulosin  0.4 MG Caps capsule Commonly known as: FLOMAX  Take 2 capsules (0.8 mg total) by mouth daily.   torsemide  10 MG tablet Commonly known as: DEMADEX  Take 1 tablet (10 mg total) by mouth 2 (two) times daily.   Vitamin D 50 MCG (2000 UT) Caps Take 2,000 Units by mouth daily.         Discharge Exam: No distress Incision site looks CDI Abd soft Lungs clear Heart sounds regular Ambulating and moving all 4 ext  PHYSICAL EXAM  HENT:     Head: Normocephalic.     Nose: Nose normal.  Eyes:     Pupils: Pupils are equal, round, and reactive to light.  Cardiovascular:     Rate and Rhythm: Normal rate.  Pulmonary:     Effort: Pulmonary effort is normal.  Abdominal:     General: Abdomen is flat.  Musculoskeletal:     Cervical back: Normal range of motion.  Neurological:     Mental Status: Patient is alert and oriented.        Cranial Nerves: Cranial nerves 2-12 are intact.     Sensory: Sensation is intact.     Motor: Motor function is intact.    Coordination: Coordination is intact.  Condition at discharge: Stable  Time spent on discharge: 30 minutes

## 2024-07-10 NOTE — Discharge Instructions (Addendum)
 Rest for 24-48 hours. Avoid strenuous activity for at least 1 week. No heavy lifting >10-15lbs for at least 1 week. You may shower; however, no soaking (baths, pools) for 1 week. Mild bruising of the puncture site is expected.  Continue your home medication with the following alterations:  Start taking sodium bicarbonate  325mg  one time in the morning daily. Resume taking Eliquis  5 mg by mouth 2 (two) times daily.  Stop taking Hydralazine  (Apresline) 25mg  tablet. Instead start taking amLODipine  (NORVASC ) 5 MG tablet Mild headache or fatigue for several days is normal.  Call if you have: severe or worsening headache or new symptoms such as numbness, confusion, slurred speech, or vision changes.  Fever >101 degrees F, worsening pain, of drainage from puncture site.   Swelling, a growing lump, or uncontrolled bleeding at the access site. Chest pain or shortness of breath.  Follow up in: 1 month

## 2024-07-10 NOTE — Plan of Care (Signed)
  Problem: Clinical Measurements: Goal: Ability to maintain clinical measurements within normal limits will improve Outcome: Progressing   Problem: Activity: Goal: Risk for activity intolerance will decrease Outcome: Progressing   Problem: Elimination: Goal: Will not experience complications related to urinary retention Outcome: Progressing   Problem: Pain Managment: Goal: General experience of comfort will improve and/or be controlled Outcome: Progressing   Problem: Safety: Goal: Ability to remain free from injury will improve Outcome: Progressing   Problem: Education: Goal: Ability to describe self-care measures that may prevent or decrease complications (Diabetes Survival Skills Education) will improve Outcome: Progressing   Problem: Education: Goal: Individualized Educational Video(s) Outcome: Progressing   Problem: Education: Goal: Individualized Educational Video(s) Outcome: Progressing   Problem: Coping: Goal: Ability to adjust to condition or change in health will improve Outcome: Progressing   Problem: Fluid Volume: Goal: Ability to maintain a balanced intake and output will improve Outcome: Progressing   Problem: Health Behavior/Discharge Planning: Goal: Ability to identify and utilize available resources and services will improve Outcome: Progressing   Problem: Health Behavior/Discharge Planning: Goal: Ability to manage health-related needs will improve Outcome: Progressing

## 2024-07-11 ENCOUNTER — Other Ambulatory Visit: Admitting: Urology

## 2024-07-11 ENCOUNTER — Telehealth: Payer: Self-pay

## 2024-07-11 NOTE — Telephone Encounter (Signed)
 Spoke with Dorthea with [facility] to provide verbal OK order from Dr Abbey for patient for PT. Verbalized understanding.

## 2024-07-11 NOTE — Telephone Encounter (Signed)
 Please provide verbal order for physical therapy.   Thank you,  Luke Shade, MD

## 2024-07-11 NOTE — Telephone Encounter (Signed)
 Copied from CRM #[id number]. Topic: Clinical - Home Health Verbal Orders >> Jul 10, 2024  4:56 PM Jayma L wrote: Caller/Agency: [facility] Callback Number: [phone] Service Requested: Physical Therapy Frequency: one week 1  Any new concerns about the patient? No

## 2024-07-12 ENCOUNTER — Encounter: Payer: Self-pay | Admitting: Cardiovascular Disease

## 2024-07-12 NOTE — Telephone Encounter (Signed)
 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 Discharged on 07/10/24 with following precautions:  Avoid strenous activities for 1 W, no lifting 10-15 lbs for 1 week. After reviewing above documentation I recommend resuming  physical therapy after 07/19/24. I signed the PT form with above information and put it in completed folder.    Luke Shade, MD

## 2024-07-12 NOTE — Telephone Encounter (Signed)
 Spoke with LeeAnn to make her aware of Dr Graylon recommendations. Per Lavetta verbalized understanding and had no further questions at this time.

## 2024-07-20 ENCOUNTER — Telehealth: Payer: Self-pay

## 2024-07-20 NOTE — Telephone Encounter (Signed)
 Per review of chart, I see a note from Dr. Rolan Sharps Also would start on daily bicarb tabs for his chronic metabolic acidosis.

## 2024-07-20 NOTE — Telephone Encounter (Signed)
 Patient's daughter contacted me to ask about the medication sodium bicarbonate .   She wanted to know why this was prescribed at discharge since he was not on the medication before.   They want to know how long he should take the medication for.

## 2024-07-20 NOTE — Telephone Encounter (Signed)
 Daughter [name] states that the patient has been taking a spoonful of baking soda from the pantry with some lime. I asked them if he did not receive the script from the hospital post discharge.   I warned against this as if not properly dosed, he can be at risk of metabolic alkalosis.  I am waiting to hear back from her in regards to the script from the pharmacy being picked up.

## 2024-07-20 NOTE — Telephone Encounter (Signed)
 Per Sam NP, patient should follow up with PCP about chronic metabolic acidosis. He needs to stop using spoonfuls as they are not a good unit of measure in comparison to how the medication was prescribed.  I spoke with Patty. She understands and will discuss with her father about following up with the primary care doctor and for him not to self dose with a spoon as this is harmful.

## 2024-07-31 ENCOUNTER — Encounter: Payer: Self-pay | Admitting: Urology

## 2024-07-31 ENCOUNTER — Ambulatory Visit: Admitting: Urology

## 2024-07-31 VITALS — BP 123/56 | HR 67 | Ht 68.0 in | Wt 163.0 lb

## 2024-07-31 DIAGNOSIS — R300 Dysuria: Secondary | ICD-10-CM | POA: Diagnosis not present

## 2024-07-31 MED ORDER — AMITRIPTYLINE HCL 10 MG PO TABS: 10.0000 mg | ORAL_TABLET | Freq: Every day | ORAL | 0 refills | Status: AC

## 2024-07-31 NOTE — Progress Notes (Signed)
" ° °  07/31/2024  CC:  Chief Complaint  Patient presents with   Cysto    HPI: Refer to [name]'s previous note 06/27/2024.  Today he states his most bothersome symptom is a burning sensation at the head of the penis with some radiation to the scrotum not related to voiding.  He prepares a drink with water , lime juice, sugar and salt and states he gets relief of his discomfort for 5-6 hours.  A Spanish interpreter was present via video link.   Cystoscopy Procedure Note  Patient identification was confirmed, informed consent was obtained, and patient was prepped using Betadine  solution.  Lidocaine  jelly was administered per urethral meatus.     Pre-Procedure: - Inspection reveals a normal caliber urethral meatus.  Procedure: The flexible cystoscope was introduced without difficulty - No urethral strictures/lesions are present. - Nonocclusive prostate; no prostatic calcifications - Normal bladder neck - Bilateral ureteral orifices identified - Bladder mucosa  reveals no ulcers, tumors, or lesions - No bladder stones - Mild trabeculation  Retroflexion shows no calcifications or proximal UroLift clips in bladder   Post-Procedure: - Patient tolerated the procedure well  Assessment/ Plan: Unremarkable cystoscopy Today symptoms are more in line with prostatic inflammation Trial amitriptyline 10 mg at bedtime x 30 days PA follow-up 1 month for recheck    Leonard JAYSON Barba, MD "

## 2024-08-01 LAB — URINALYSIS, COMPLETE
Bilirubin, UA: NEGATIVE
Glucose, UA: NEGATIVE
Ketones, UA: NEGATIVE
Leukocytes,UA: NEGATIVE
Nitrite, UA: NEGATIVE
Protein,UA: NEGATIVE
RBC, UA: NEGATIVE
Specific Gravity, UA: 1.015 (ref 1.005–1.030)
Urobilinogen, Ur: 0.2 mg/dL (ref 0.2–1.0)
pH, UA: 6 (ref 5.0–7.5)

## 2024-08-01 LAB — MICROSCOPIC EXAMINATION

## 2024-08-08 ENCOUNTER — Encounter: Payer: Self-pay | Admitting: Neuroradiology

## 2024-08-08 ENCOUNTER — Ambulatory Visit: Admitting: Neuroradiology

## 2024-08-08 VITALS — BP 126/56 | HR 56 | Temp 97.5°F | Ht 68.0 in | Wt 165.0 lb

## 2024-08-08 DIAGNOSIS — I671 Cerebral aneurysm, nonruptured: Secondary | ICD-10-CM

## 2024-08-08 DIAGNOSIS — Z09 Encounter for follow-up examination after completed treatment for conditions other than malignant neoplasm: Secondary | ICD-10-CM | POA: Diagnosis not present

## 2024-08-08 DIAGNOSIS — Z95828 Presence of other vascular implants and grafts: Secondary | ICD-10-CM | POA: Diagnosis not present

## 2024-08-08 NOTE — Progress Notes (Signed)
 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  This was done with radial access.  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  Otherwise neurologically intact and feels well.  Right radial pulse is normal.  We talked about the need for follow-up to ensure that the aneurysm does not recur.  The plan is for a follow-up arteriogram at 6 months.  He is following up with Dr. Marea for his right carotid stenosis

## 2024-08-20 ENCOUNTER — Ambulatory Visit (INDEPENDENT_AMBULATORY_CARE_PROVIDER_SITE_OTHER): Admitting: Cardiovascular Disease

## 2024-08-20 ENCOUNTER — Encounter: Payer: Self-pay | Admitting: Cardiovascular Disease

## 2024-08-20 VITALS — BP 120/52 | HR 68 | Ht 67.0 in | Wt 166.8 lb

## 2024-08-20 DIAGNOSIS — I25708 Atherosclerosis of coronary artery bypass graft(s), unspecified, with other forms of angina pectoris: Secondary | ICD-10-CM

## 2024-08-20 DIAGNOSIS — R001 Bradycardia, unspecified: Secondary | ICD-10-CM

## 2024-08-20 DIAGNOSIS — I480 Paroxysmal atrial fibrillation: Secondary | ICD-10-CM

## 2024-08-20 DIAGNOSIS — E7800 Pure hypercholesterolemia, unspecified: Secondary | ICD-10-CM | POA: Diagnosis not present

## 2024-08-20 DIAGNOSIS — I5020 Unspecified systolic (congestive) heart failure: Secondary | ICD-10-CM | POA: Diagnosis not present

## 2024-08-20 DIAGNOSIS — I700 Atherosclerosis of aorta: Secondary | ICD-10-CM | POA: Diagnosis not present

## 2024-08-20 DIAGNOSIS — I6523 Occlusion and stenosis of bilateral carotid arteries: Secondary | ICD-10-CM | POA: Diagnosis not present

## 2024-08-20 DIAGNOSIS — Z8679 Personal history of other diseases of the circulatory system: Secondary | ICD-10-CM | POA: Diagnosis not present

## 2024-08-20 DIAGNOSIS — I10 Essential (primary) hypertension: Secondary | ICD-10-CM

## 2024-08-20 DIAGNOSIS — I509 Heart failure, unspecified: Secondary | ICD-10-CM

## 2024-08-20 NOTE — Progress Notes (Signed)
 "     Cardiology Office Note   Date:  08/20/2024   ID:  [name], DOB [birth year], MRN [id number]  PCP:  Abbey Bruckner, MD  Cardiologist:  Denyse Bathe, MD      History of Present Illness: [name] is a 69 y.o. male who presents for  Chief Complaint  Patient presents with   Follow-up    2 month follow up    Doing well.      Past Medical History:  Diagnosis Date   Acute exacerbation of CHF (congestive heart failure) (HCC) 12/16/2023   Acute on chronic congestive heart failure (HCC) 11/03/2023   Adenomatous polyp of colon 01/30/2024   AKI (acute kidney injury) 12/16/2023   Aortic atherosclerosis    Arthritis    BPH with LUTS s/p UroLift with Dr. Penne in November 2024 on finasteride , Flomax , and Sanctura   and sees [facility]    BPH with LUTS, s/p urolift with Dr. 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   GERD (gastroesophageal reflux disease)    History of blood transfusion    Hospitalization within last 30 days 06/03/2020   Hypertension    Long term current use of aspirin     Long term current use of immunosuppressive drug    a.) mycophenolate  + tacrolimus    Myocardial infarction [facility]) 2004   a.) details unclear; stents x 2 (unknown type/location)   Myocardial infarction (HCC) 2005   a.) details unclear; stent x 1 (unknown type/location)   On apixaban  therapy    Orthopnea 12/16/2023   PAF (paroxysmal atrial fibrillation) (HCC)    a.) CHA2DS2-VASc =  5 (age, HTN, vascular disease history/MI, T2DM) as of 06/10/2023; b.) cardiac rate/rhythm maintained intrinsically without pharmacological intervention; chronically anticoagulated using apixaban    Pneumonia    x 1   Sepsis due to gram-negative UTI (HCC) 04/27/2020   Sleep apnea    uses CPAP nightly   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   From DM and HTN, f/u with [facility] every 6 months   Stroke (HCC) 06/02/2024   Subconjunctival hemorrhage of right eye 12/15/2023   T2DM (type 2 diabetes mellitus) (HCC)    Unstable angina (HCC)    Vitamin D deficiency      Past Surgical History:  Procedure Laterality Date   CATARACT EXTRACTION Bilateral    CATARACT EXTRACTION W/PHACO Right 02/03/2017   Procedure: CATARACT EXTRACTION PHACO AND INTRAOCULAR LENS PLACEMENT (IOC);  Surgeon: Mittie Gaskin, MD;  Location: [facility] ORS;  Service: Ophthalmology;  Laterality: Right;  US  00:35.8 AP% 12.8 CDE 4.57 Fluid lot # [id number]   COLONOSCOPY N/A 01/30/2024   Procedure: COLONOSCOPY;  Surgeon: Therisa Bi, MD;  Location: [facility] ENDOSCOPY;  Service: Gastroenterology;  Laterality: N/A;  IDDM   COLONOSCOPY WITH PROPOFOL  N/A 12/30/2021   Procedure: COLONOSCOPY WITH PROPOFOL ;  Surgeon: Therisa Bi, MD;  Location: [facility] ENDOSCOPY;  Service: Gastroenterology;  Laterality: N/A;   CORONARY ANGIOPLASTY WITH STENT PLACEMENT Left 2004   stents x 2   CORONARY ANGIOPLASTY WITH STENT PLACEMENT Left 2005   stent x 1   CORONARY ANGIOPLASTY WITH STENT PLACEMENT Left 08/25/2007   Procedure: CORONARY ANGIOPLASTY WITH STENT PLACEMENT; Location: [facility]; Surgeon: Valinda Cage, MD   CORONARY ARTERY BYPASS GRAFT N/A 06/08/2006   Procedure: CORONARY ARTERY BYPASS GRAFT; Location: [facility]; Surgeon: Maude Sharps, MD   CYSTOSCOPY WITH INSERTION OF UROLIFT N/A 06/13/2023   Procedure: CYSTOSCOPY WITH INSERTION OF UROLIFT;  Surgeon: Penne Knee, MD;  Location: [facility] ORS;  Service: Urology;  Laterality: N/A;   ENDARTERECTOMY Left  09/06/2019   Procedure: ENDARTERECTOMY CAROTID;  Surgeon: Marea Selinda RAMAN, MD;  Location: [facility] ORS;  Service: Vascular;  Laterality: Left;   ESOPHAGOGASTRODUODENOSCOPY N/A 01/30/2024   Procedure: EGD (ESOPHAGOGASTRODUODENOSCOPY);  Surgeon: Therisa Bi, MD;  Location: [facility] ENDOSCOPY;  Service: Gastroenterology;  Laterality: N/A;   HIP FRACTURE SURGERY     IR ANGIO INTRA EXTRACRAN SEL INTERNAL CAROTID UNI R MOD SED  07/09/2024   IR ANGIOGRAM FOLLOW UP STUDY  07/09/2024   IR ANGIOGRAM FOLLOW UP STUDY  07/09/2024   IR ANGIOGRAM FOLLOW UP STUDY  07/09/2024   IR NEURO EACH ADD'L AFTER BASIC UNI RIGHT (MS)  07/09/2024   IR TRANSCATH/EMBOLIZ  07/09/2024   IR US  GUIDE VASC ACCESS RIGHT  07/09/2024   KIDNEY TRANSPLANT Right 05/10/2015   LEFT HEART CATH AND CORONARY ANGIOGRAPHY Left 05/20/2006   Procedure: LEFT HEART CATH AND CORONARY ANGIOGRAPHY; Location: [facility]; Surgeon: Denyse Bathe, MD   LEFT HEART CATH AND CORONARY ANGIOGRAPHY Left 11/08/2023   Procedure: LEFT HEART CATH AND CORONARY ANGIOGRAPHY with possible intervention;  Surgeon: Bathe Denyse LABOR, MD;  Location: [facility] INVASIVE CV LAB;  Service: Cardiovascular;  Laterality: Left;   LEFT HEART CATH AND CORS/GRAFTS ANGIOGRAPHY Left 08/10/2007   Procedure: LEFT HEART CATH AND CORS/GRAFTS ANGIOGRAPHY; Location: [facility]; Surgeon: Denyse Bathe, MD   LEFT HEART CATH AND CORS/GRAFTS ANGIOGRAPHY Left 05/04/2011   Procedure: LEFT HEART CATH AND CORS/GRAFTS ANGIOGRAPHY; Location: [facility]; Surgeon: Denyse Bathe, MD   POLYPECTOMY  01/30/2024   Procedure: POLYPECTOMY, INTESTINE;  Surgeon: Therisa Bi, MD;  Location: [facility] ENDOSCOPY;  Service: Gastroenterology;;   RADIOLOGY WITH ANESTHESIA N/A 07/09/2024   Procedure: RADIOLOGY WITH ANESTHESIA;  Surgeon: Lester Golas, MD;  Location: [facility] OR;  Service: Radiology;  Laterality: N/A;  RMCA aneurysm coiling     Current Outpatient Medications  Medication Sig Dispense Refill   acetaminophen  (TYLENOL ) 500 MG tablet Take 500 mg by mouth  every 6 (six) hours as needed for mild pain (pain score 1-3) or moderate pain (pain score 4-6).     amiodarone  (PACERONE ) 200 MG tablet TAKE 1 TABLET BY MOUTH EVERY DAY 90 tablet 1   amitriptyline  (ELAVIL ) 10 MG tablet Take 1 tablet (10 mg total) by mouth at bedtime. 30 tablet 0   amLODipine  (NORVASC ) 5 MG tablet Take 1 tablet (5 mg total) by mouth daily. 90 tablet 3   ascorbic acid (VITAMIN C) 500 MG tablet Take 500 mg by mouth daily.     aspirin  EC 81 MG EC tablet Take 1 tablet (81 mg total) by mouth daily at 6 (six) AM.     atorvastatin  (LIPITOR) 40 MG tablet Take 40 mg by mouth at bedtime.     CELLCEPT  250 MG capsule Take 500 mg by mouth 2 (two) times daily.     Cholecalciferol  (VITAMIN D) 50 MCG (2000 UT) CAPS Take 2,000 Units by mouth  daily.     Continuous Blood Gluc Sensor (FREESTYLE LIBRE 2 SENSOR) MISC by Does not apply route.     Continuous Glucose Sensor (FREESTYLE LIBRE 2 PLUS SENSOR) MISC 1 each by Other route every 14 (fourteen) days.     ELIQUIS  5 MG TABS tablet Take 5 mg by mouth 2 (two) times daily.      finasteride  (PROSCAR ) 5 MG tablet Take 1 tablet (5 mg total) by mouth daily. 90 tablet 3   HUMALOG KWIKPEN 100 UNIT/ML KiwkPen Inject 5-10 Units into the skin 3 (three) times daily. Sliding scale     insulin  glargine (LANTUS  SOLOSTAR) 100 UNIT/ML Solostar Pen Inject 30 Units into the skin at bedtime.     Insulin  Pen Needle 33G X 4 MM MISC To use with insulin  pen 4 times per day. E 13.9     linagliptin  (TRADJENTA ) 5 MG TABS tablet Take 1 tablet (5 mg total) by mouth daily. 90 tablet 1   losartan  (COZAAR ) 100 MG tablet Take 1 tablet (100 mg total) by mouth daily. 30 tablet 11   nitroGLYCERIN  (NITROSTAT ) 0.4 MG SL tablet Place 1 tablet (0.4 mg total) under the tongue every 5 (five) minutes as needed for chest pain. 100 tablet 3   omeprazole  (PRILOSEC) 20 MG capsule Take 20 mg by mouth daily as needed (heartburn).     PROGRAF  1 MG capsule Take 1 mg by mouth every morning.     sodium  bicarbonate 325 MG tablet Take 1 tablet (325 mg total) by mouth 2 (two) times daily. 100 tablet 1   tacrolimus  (PROGRAF ) 0.5 MG capsule Take 0.5 mg by mouth at bedtime.     tamsulosin  (FLOMAX ) 0.4 MG CAPS capsule Take 2 capsules (0.8 mg total) by mouth daily. 180 capsule 3   torsemide  (DEMADEX ) 10 MG tablet Take 1 tablet (10 mg total) by mouth 2 (two) times daily. 60 tablet 11   trospium  (SANCTURA ) 20 MG tablet Take 20 mg by mouth at bedtime.     No current facility-administered medications for this visit.   Facility-Administered Medications Ordered in Other Visits  Medication Dose Route Frequency Provider Last Rate Last Admin   sodium chloride  flush (NS) 0.9 % injection 3 mL  3 mL Intravenous Q12H Fernand Alter A, MD        Allergies:   Oxybutynin  and Trospium  chloride    Social History:   reports that he has quit smoking. He has never used smokeless tobacco. He reports that he does not drink alcohol and does not use drugs.   Family History:  family history includes Drug abuse in his brother and sister; Osteoporosis in his mother.    ROS:     Review of Systems  Constitutional: Negative.   HENT: Negative.    Eyes: Negative.   Respiratory: Negative.    Gastrointestinal: Negative.   Genitourinary: Negative.   Musculoskeletal: Negative.   Skin: Negative.   Neurological: Negative.   Endo/Heme/Allergies: Negative.   Psychiatric/Behavioral: Negative.    All other systems reviewed and are negative.     All other systems are reviewed and negative.    PHYSICAL EXAM: VS:  BP (!) 120/52   Pulse 68   Ht 5' 7 (1.702 m)   Wt 166 lb 12.8 oz (75.7 kg)   SpO2 98%   BMI 26.12 kg/m  , BMI Body mass index is 26.12 kg/m. Last weight:  Wt Readings from Last 3 Encounters:  08/20/24 166 lb 12.8 oz (75.7 kg)  08/08/24 165  lb (74.8 kg)  07/31/24 163 lb (73.9 kg)     Physical Exam Vitals reviewed.  Constitutional:      Appearance: Normal appearance. He is normal weight.  HENT:      Head: Normocephalic.     Nose: Nose normal.     Mouth/Throat:     Mouth: Mucous membranes are moist.  Eyes:     Pupils: Pupils are equal, round, and reactive to light.  Cardiovascular:     Rate and Rhythm: Normal rate and regular rhythm.     Pulses: Normal pulses.     Heart sounds: Normal heart sounds.  Pulmonary:     Effort: Pulmonary effort is normal.  Abdominal:     General: Abdomen is flat. Bowel sounds are normal.  Musculoskeletal:        General: Normal range of motion.     Cervical back: Normal range of motion.  Skin:    General: Skin is warm.  Neurological:     General: No focal deficit present.     Mental Status: He is alert.  Psychiatric:        Mood and Affect: Mood normal.       EKG:   Recent Labs: 12/16/2023: B Natriuretic Peptide 684.4; TSH 2.987 12/18/2023: Magnesium  2.0 05/01/2024: Pro B Natriuretic peptide (BNP) 461.0 06/03/2024: ALT 29 07/10/2024: BUN 40; Creatinine, Ser 1.80; Hemoglobin 12.8; Platelets 130; Potassium 4.1; Sodium 134    Lipid Panel    Component Value Date/Time   CHOL 108 06/03/2024 0542   TRIG 63 06/03/2024 0542   HDL 35 (L) 06/03/2024 0542   CHOLHDL 3.1 06/03/2024 0542   VLDL 13 06/03/2024 0542   LDLCALC 60 06/03/2024 0542      Other studies Reviewed: Additional studies/ records that were reviewed today include:  Review of the above records demonstrates:       No data to display            ASSESSMENT AND PLAN:    ICD-10-CM   1. Heart failure, unspecified HF chronicity, unspecified heart failure type (HCC)  I50.9    compensated, with GDMT    2. Paroxysmal atrial fibrillation (HCC)  I48.0     3. Heart failure with mildly reduced ejection fraction (HFmrEF) (HCC)  I50.20     4. Sinus bradycardia  R00.1     5. Atrial fibrillation, currently in sinus rhythm  Z86.79     6. Hypercholesteremia  E78.00     7. Aortic atherosclerosis  I70.0     8. Bilateral carotid artery stenosis  I65.23     9. Coronary artery  disease involving coronary bypass graft of native heart with other forms of angina pectoris  I25.708     10. Essential (primary) hypertension  I10        Problem List Items Addressed This Visit       Cardiovascular and Mediastinum   Essential (primary) hypertension (Chronic)   Coronary artery disease (Chronic)   Paroxysmal atrial fibrillation (HCC) (Chronic)   Heart failure with mildly reduced ejection fraction (HFmrEF) (HCC) (Chronic)   Carotid artery stenosis   Aortic atherosclerosis     Other   Hypercholesteremia   Other Visit Diagnoses       Heart failure, unspecified HF chronicity, unspecified heart failure type (HCC)    -  Primary   compensated, with GDMT     Sinus bradycardia         Atrial fibrillation, currently in sinus rhythm  Disposition:   Return in about 3 months (around 11/18/2024).    Total time spent: 30 minutes  Signed,  Denyse Bathe, MD  08/20/2024 11:52 AM    [facility] "

## 2024-08-22 ENCOUNTER — Telehealth: Payer: Self-pay

## 2024-08-22 ENCOUNTER — Other Ambulatory Visit: Payer: Self-pay | Admitting: Urology

## 2024-08-22 NOTE — Telephone Encounter (Signed)
 Lm and sent MyChart message;  Your appontment on 08/24/2024 with Dr Abbey needs to be rescheduled. Please call the office to reschedule your appointment or you can reschedule through your MyChart.  E2C2 please reschedule

## 2024-08-23 ENCOUNTER — Ambulatory Visit: Admitting: Cardiovascular Disease

## 2024-08-23 DIAGNOSIS — Z940 Kidney transplant status: Principal | ICD-10-CM

## 2024-08-23 MED ORDER — TACROLIMUS 0.5 MG CAPSULE, IMMEDIATE-RELEASE
ORAL_CAPSULE | Freq: Every evening | ORAL | 3 refills | 90.00000 days | Status: CP
Start: 2024-08-23 — End: 2025-08-23

## 2024-08-23 MED ORDER — OMEPRAZOLE 20 MG PO CPDR: 20.0000 mg | DELAYED_RELEASE_CAPSULE | Freq: Every day | ORAL | 0 refills | Status: AC | PRN

## 2024-08-23 NOTE — Addendum Note (Signed)
 Addended by: ANICE BELT on: 08/23/2024 10:49 AM   Modules accepted: Orders

## 2024-08-24 ENCOUNTER — Ambulatory Visit

## 2024-08-27 DIAGNOSIS — Z940 Kidney transplant status: Principal | ICD-10-CM

## 2024-08-30 NOTE — Progress Notes (Signed)
 "    08/31/2024 9:33 AM   [name] [birth year] [id number]  Referring provider: Abbey Bruckner, MD [address] [place],  [place] [address]  Urological history: 1. BPH with LU TS - PSA (01/2024) 0.2 - TRUS (2022) 43 cc - cysto (2022) NED - UDS (2024) non diagnostic - UroLift (06/2023)  - cysto (07/2024) NED  - managed on finasteride  5 mg daily, tamsulosin  0.8 mg daily  2. Nocturia - oxybutynin  caused retention/severe constipation  - Trospium  caused retention  3. Renal transplant  - serum creatinine (01/2024) 1.18, eGFR 67 - right renal transplant (2016)  - followed by [facility]  4. ED  5. Pyelonephritis  - hospitalized (2021)  Chief Complaint  Patient presents with   Benign Prostatic Hypertrophy   HPI: [name] is a 69 y.o. man who presents today for recheck after one month trial of amitriptyline  10 mg nightly for prostatic inflammation appreciated on cystoscopy.  Previous records reviewed.   I PSS 29/6  - he has burning in the perineum and through the penis with minimal relief after voiding - Irritative symptoms: frequency, urgency, nocturia  5 - Obstructive symptoms: weak stream, hesitancy, intermittency, straining, incomplete emptying  - Incontinence: none - Symptom progression: stable   - Current therapy: finasteride  5 mg daily and tamsulosin  0.8 mg daily and amitriptyline  10 mg daily  - went into retention with OAB meds - Response to therapy: inadequate  - Denies: gross hematuria, dysuria, flank pain, fever, chills, retention.  - Quality of life: severe bother from urinary symptoms   UA (07/2024) unremarkable   PVR (06/2024) 27 mL   PSA (01/2024) 0.2   Serum creatinine (07/2024) 1.80, eGFR 40  Hemoglobin A1c (06/2024) 6.7     PMH: Past Medical History:  Diagnosis Date   Acute exacerbation of CHF (congestive heart failure) (HCC) 12/16/2023   Acute on chronic congestive heart failure (HCC) 11/03/2023   Adenomatous polyp of colon  01/30/2024   AKI (acute kidney injury) 12/16/2023   Aortic atherosclerosis    Arthritis    BPH with LUTS s/p UroLift with Dr. Penne in November 2024 on finasteride , Flomax , and Sanctura   and sees [facility]    BPH with LUTS, s/p urolift with Dr. 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   GERD (gastroesophageal reflux disease)    History of blood transfusion    Hospitalization within last 30 days 06/03/2020   Hypertension    Long term current use of aspirin     Long term current use of immunosuppressive drug    a.) mycophenolate  + tacrolimus    Myocardial infarction [facility]) 2004   a.) details unclear; stents x 2 (unknown type/location)   Myocardial infarction (HCC) 2005   a.) details unclear; stent x 1 (unknown type/location)   On apixaban  therapy    Orthopnea 12/16/2023   PAF (paroxysmal atrial fibrillation) (HCC)    a.)  CHA2DS2-VASc = 5 (age, HTN, vascular disease history/MI, T2DM) as of 06/10/2023; b.) cardiac rate/rhythm maintained intrinsically without pharmacological intervention; chronically anticoagulated using apixaban    Pneumonia    x 1   Sepsis due to gram-negative UTI (HCC) 04/27/2020   Sleep apnea    uses CPAP nightly   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   From DM and HTN, f/u with [facility] every 6 months   Stroke (HCC) 06/02/2024   Subconjunctival hemorrhage of right eye 12/15/2023   T2DM (type 2 diabetes mellitus) (HCC)    Unstable angina (HCC)    Vitamin D  deficiency     Surgical History: Past Surgical History:  Procedure Laterality Date   CATARACT EXTRACTION Bilateral    CATARACT EXTRACTION W/PHACO Right 02/03/2017   Procedure: CATARACT EXTRACTION PHACO AND INTRAOCULAR LENS PLACEMENT (IOC);  Surgeon: Mittie Gaskin, MD;  Location: [facility] ORS;  Service: Ophthalmology;  Laterality: Right;  US  00:35.8 AP% 12.8 CDE 4.57 Fluid lot # [id number]   COLONOSCOPY N/A 01/30/2024   Procedure: COLONOSCOPY;  Surgeon: Therisa Bi, MD;  Location: [facility] ENDOSCOPY;  Service: Gastroenterology;  Laterality: N/A;  IDDM   COLONOSCOPY WITH PROPOFOL  N/A 12/30/2021   Procedure: COLONOSCOPY WITH PROPOFOL ;  Surgeon: Therisa Bi, MD;  Location: [facility] ENDOSCOPY;  Service: Gastroenterology;  Laterality: N/A;   CORONARY ANGIOPLASTY WITH STENT PLACEMENT Left 2004   stents x 2   CORONARY ANGIOPLASTY WITH STENT PLACEMENT Left 2005   stent x 1   CORONARY ANGIOPLASTY WITH STENT PLACEMENT Left 08/25/2007   Procedure: CORONARY ANGIOPLASTY WITH STENT PLACEMENT; Location: [facility]; Surgeon: Valinda Cage, MD   CORONARY ARTERY BYPASS GRAFT N/A 06/08/2006   Procedure: CORONARY ARTERY BYPASS GRAFT; Location: [facility]; Surgeon: Maude Sharps, MD   CYSTOSCOPY WITH INSERTION OF UROLIFT N/A 06/13/2023   Procedure: CYSTOSCOPY WITH INSERTION OF UROLIFT;  Surgeon: Penne Knee, MD;  Location: [facility] ORS;  Service: Urology;  Laterality: N/A;   ENDARTERECTOMY Left 09/06/2019   Procedure: ENDARTERECTOMY CAROTID;  Surgeon: Marea Selinda RAMAN, MD;  Location: [facility] ORS;  Service: Vascular;  Laterality: Left;   ESOPHAGOGASTRODUODENOSCOPY N/A 01/30/2024   Procedure: EGD (ESOPHAGOGASTRODUODENOSCOPY);  Surgeon: Therisa Bi, MD;  Location: [facility] ENDOSCOPY;  Service: Gastroenterology;  Laterality: N/A;   HIP FRACTURE SURGERY     IR ANGIO INTRA EXTRACRAN SEL INTERNAL CAROTID UNI R MOD SED  07/09/2024   IR ANGIOGRAM FOLLOW UP STUDY  07/09/2024   IR ANGIOGRAM FOLLOW UP STUDY  07/09/2024   IR ANGIOGRAM FOLLOW UP STUDY   07/09/2024   IR NEURO EACH ADD'L AFTER BASIC UNI RIGHT (MS)  07/09/2024   IR TRANSCATH/EMBOLIZ  07/09/2024   IR US  GUIDE VASC ACCESS RIGHT  07/09/2024   KIDNEY TRANSPLANT Right 05/10/2015   LEFT HEART CATH AND CORONARY ANGIOGRAPHY Left 05/20/2006   Procedure: LEFT HEART CATH AND CORONARY ANGIOGRAPHY; Location: [facility]; Surgeon: Denyse Bathe, MD   LEFT HEART CATH AND CORONARY ANGIOGRAPHY Left 11/08/2023   Procedure: LEFT HEART CATH AND CORONARY ANGIOGRAPHY with possible intervention;  Surgeon: Bathe Denyse LABOR, MD;  Location: [facility] INVASIVE CV LAB;  Service: Cardiovascular;  Laterality: Left;   LEFT HEART CATH AND CORS/GRAFTS ANGIOGRAPHY Left 08/10/2007   Procedure: LEFT HEART CATH AND CORS/GRAFTS ANGIOGRAPHY; Location: [facility]; Surgeon: Denyse Bathe, MD   LEFT HEART CATH AND CORS/GRAFTS ANGIOGRAPHY Left 05/04/2011   Procedure: LEFT HEART CATH AND CORS/GRAFTS ANGIOGRAPHY; Location: [facility]; Surgeon: Denyse Bathe, MD   POLYPECTOMY  01/30/2024   Procedure: POLYPECTOMY, INTESTINE;  Surgeon: Therisa Bi, MD;  Location: [facility] ENDOSCOPY;  Service: Gastroenterology;;  RADIOLOGY WITH ANESTHESIA N/A 07/09/2024   Procedure: RADIOLOGY WITH ANESTHESIA;  Surgeon: Lester Golas, MD;  Location[facility] OR;  Service: Radiology;  Laterality: N/A;  RMCA aneurysm coiling    Home Medications:  Allergies as of 08/31/2024       Reactions   Oxybutynin  Other (See Comments)   Incomplete bladder emptying   Trospium  Chloride Other (See Comments)   Urinary Retention        Medication List        Accurate as of August 31, 2024  9:33 AM. If you have any questions, ask your nurse or doctor.          acetaminophen  500 MG tablet Commonly known as: TYLENOL  Take 500 mg by mouth every 6 (six) hours as needed for mild pain (pain score 1-3) or moderate pain (pain score 4-6).   amiodarone  200 MG tablet Commonly known as: PACERONE  TAKE 1 TABLET BY MOUTH EVERY DAY   amitriptyline  10 MG tablet Commonly known as: ELAVIL  Take 1 tablet  (10 mg total) by mouth at bedtime.   amLODipine  5 MG tablet Commonly known as: NORVASC  Take 1 tablet (5 mg total) by mouth daily.   ascorbic acid 500 MG tablet Commonly known as: VITAMIN C Take 500 mg by mouth daily.   aspirin  EC 81 MG tablet Take 1 tablet (81 mg total) by mouth daily at 6 (six) AM.   atorvastatin  40 MG tablet Commonly known as: LIPITOR Take 40 mg by mouth at bedtime.   CellCept  250 MG capsule Generic drug: mycophenolate  Take 500 mg by mouth 2 (two) times daily.   Eliquis  5 MG Tabs tablet Generic drug: apixaban  Take 5 mg by mouth 2 (two) times daily.   finasteride  5 MG tablet Commonly known as: PROSCAR  Take 1 tablet (5 mg total) by mouth daily.   FreeStyle Libre 2 Sensor Misc by Does not apply route.   FreeStyle Libre 2 Plus Sensor Misc 1 each by Other route every 14 (fourteen) days.   HumaLOG KwikPen 100 UNIT/ML KwikPen Generic drug: insulin  lispro Inject 5-10 Units into the skin 3 (three) times daily. Sliding scale   Insulin  Pen Needle 33G X 4 MM Misc To use with insulin  pen 4 times per day. E 13.9   Lantus  SoloStar 100 UNIT/ML Solostar Pen Generic drug: insulin  glargine Inject 30 Units into the skin at bedtime.   linagliptin  5 MG Tabs tablet Commonly known as: TRADJENTA  Take 1 tablet (5 mg total) by mouth daily.   losartan  100 MG tablet Commonly known as: Cozaar  Take 1 tablet (100 mg total) by mouth daily.   nitroGLYCERIN  0.4 MG SL tablet Commonly known as: Nitrostat  Place 1 tablet (0.4 mg total) under the tongue every 5 (five) minutes as needed for chest pain.   omeprazole  20 MG capsule Commonly known as: PRILOSEC Take 1 capsule (20 mg total) by mouth daily as needed (heartburn).   Prograf  1 MG capsule Generic drug: tacrolimus  Take 1 mg by mouth every morning.   tacrolimus  0.5 MG capsule Commonly known as: PROGRAF  Take 0.5 mg by mouth at bedtime.   sodium bicarbonate  325 MG tablet Take 1 tablet (325 mg total) by mouth 2 (two)  times daily.   tamsulosin  0.4 MG Caps capsule Commonly known as: FLOMAX  Take 2 capsules (0.8 mg total) by mouth daily.   torsemide  10 MG tablet Commonly known as: DEMADEX  Take 1 tablet (10 mg total) by mouth 2 (two) times daily.   trospium  20 MG tablet Commonly known as: SANCTURA   Take 20 mg by mouth at bedtime.   Vitamin D 50 MCG (2000 UT) Caps Take 2,000 Units by mouth daily.        Allergies:  Allergies  Allergen Reactions   Oxybutynin  Other (See Comments)    Incomplete bladder emptying   Trospium  Chloride Other (See Comments)    Urinary Retention    Family History: Family History  Problem Relation Age of Onset   Osteoporosis Mother    Drug abuse Sister    Drug abuse Brother     Social History:  reports that he has quit smoking. He has never used smokeless tobacco. He reports that he does not drink alcohol and does not use drugs.  ROS: Pertinent ROS in HPI  Physical Exam: BP (!) 97/53   Pulse 66   Ht 5' 8 (1.727 m)   Wt 162 lb (73.5 kg)   BMI 24.63 kg/m   Constitutional:  Well nourished. Alert and oriented, No acute distress. HEENT: [place] AT, moist mucus membranes.  Trachea midline Cardiovascular: No clubbing, cyanosis, or edema. Respiratory: Normal respiratory effort, no increased work of breathing. Neurologic: Grossly intact, no focal deficits, moving all 4 extremities. Psychiatric: Normal mood and affect.   Laboratory Data: See EPIC and HPI I have reviewed the labs.  See HPI.     Pertinent Imaging:  06/27/24 09:57  Scan Result 27mL    Assessment & Plan:    1. BPH with LU TS  - severe symptoms; no red flags; PVR acceptable.  - continue tamsulosin  0.8 mg daily, finasteride  5 mg daily and amitriptyline  10 mg   - educated on red flag symptoms: acute retention, gross hematuria, fever, severe pain - advised to call clinic or go to the ED if these occur   2. Dysuria/CPPS - cysto (07/2024) NED - discussed multifactorial nature of CP/CPPS  (urological, neurological, musculoskeletal, psychosocial) - continue alpha-blocker - consider referral to pelvic floor physical therapy; but there is a long wait period to be seen  - order CT renal stone study to evaluate upper tracts to see if we can discover a cause for his symptoms - I will notify him of results - if CT negative, may consider bladder rescue solutions   Return for I will call patient with results.  These notes generated with voice recognition software. I apologize for typographical errors.  [name]  [facility] [address]  [address] [place], [place] [address] [phone]  "

## 2024-08-31 ENCOUNTER — Ambulatory Visit: Admitting: Urology

## 2024-08-31 ENCOUNTER — Encounter: Payer: Self-pay | Admitting: Urology

## 2024-08-31 VITALS — BP 97/53 | HR 66 | Ht 68.0 in | Wt 162.0 lb

## 2024-08-31 DIAGNOSIS — R102 Pelvic and perineal pain unspecified side: Secondary | ICD-10-CM

## 2024-08-31 DIAGNOSIS — R300 Dysuria: Secondary | ICD-10-CM | POA: Diagnosis not present

## 2024-08-31 DIAGNOSIS — N138 Other obstructive and reflux uropathy: Secondary | ICD-10-CM | POA: Diagnosis not present

## 2024-08-31 DIAGNOSIS — N401 Enlarged prostate with lower urinary tract symptoms: Secondary | ICD-10-CM | POA: Diagnosis not present

## 2024-08-31 NOTE — Patient Instructions (Signed)
 Scheduling number: [phone]

## 2024-09-11 ENCOUNTER — Ambulatory Visit (INDEPENDENT_AMBULATORY_CARE_PROVIDER_SITE_OTHER): Admitting: Vascular Surgery

## 2024-09-11 ENCOUNTER — Encounter (INDEPENDENT_AMBULATORY_CARE_PROVIDER_SITE_OTHER)

## 2024-09-19 ENCOUNTER — Ambulatory Visit

## 2024-10-02 ENCOUNTER — Ambulatory Visit (INDEPENDENT_AMBULATORY_CARE_PROVIDER_SITE_OTHER): Admitting: Vascular Surgery

## 2024-10-02 ENCOUNTER — Encounter (INDEPENDENT_AMBULATORY_CARE_PROVIDER_SITE_OTHER)

## 2024-11-19 ENCOUNTER — Ambulatory Visit: Admitting: Cardiovascular Disease

## 2025-02-25 ENCOUNTER — Ambulatory Visit

## 2025-02-27 ENCOUNTER — Other Ambulatory Visit

## 2025-03-06 ENCOUNTER — Ambulatory Visit: Admitting: Urology
